# Patient Record
Sex: Female | Born: 1945 | Race: Black or African American | Hispanic: No | Smoking: Current some day smoker
Health system: Southern US, Community
[De-identification: ages and names within clinical notes are randomized; demographics above are authoritative.]

## PROBLEM LIST (undated history)

## (undated) DIAGNOSIS — G709 Myoneural disorder, unspecified: Secondary | ICD-10-CM

## (undated) DIAGNOSIS — H269 Unspecified cataract: Secondary | ICD-10-CM

## (undated) DIAGNOSIS — K759 Inflammatory liver disease, unspecified: Secondary | ICD-10-CM

## (undated) DIAGNOSIS — T7840XA Allergy, unspecified, initial encounter: Secondary | ICD-10-CM

## (undated) DIAGNOSIS — R112 Nausea with vomiting, unspecified: Secondary | ICD-10-CM

## (undated) DIAGNOSIS — Z923 Personal history of irradiation: Secondary | ICD-10-CM

## (undated) DIAGNOSIS — F419 Anxiety disorder, unspecified: Secondary | ICD-10-CM

## (undated) DIAGNOSIS — D649 Anemia, unspecified: Secondary | ICD-10-CM

## (undated) DIAGNOSIS — E785 Hyperlipidemia, unspecified: Secondary | ICD-10-CM

## (undated) DIAGNOSIS — M1990 Unspecified osteoarthritis, unspecified site: Secondary | ICD-10-CM

## (undated) DIAGNOSIS — T8859XA Other complications of anesthesia, initial encounter: Secondary | ICD-10-CM

## (undated) DIAGNOSIS — Z9889 Other specified postprocedural states: Secondary | ICD-10-CM

## (undated) DIAGNOSIS — R609 Edema, unspecified: Secondary | ICD-10-CM

## (undated) DIAGNOSIS — M79609 Pain in unspecified limb: Secondary | ICD-10-CM

## (undated) DIAGNOSIS — M545 Low back pain: Secondary | ICD-10-CM

## (undated) DIAGNOSIS — B1710 Acute hepatitis C without hepatic coma: Secondary | ICD-10-CM

## (undated) DIAGNOSIS — I10 Essential (primary) hypertension: Secondary | ICD-10-CM

## (undated) DIAGNOSIS — E119 Type 2 diabetes mellitus without complications: Secondary | ICD-10-CM

## (undated) DIAGNOSIS — M069 Rheumatoid arthritis, unspecified: Secondary | ICD-10-CM

## (undated) DIAGNOSIS — K219 Gastro-esophageal reflux disease without esophagitis: Secondary | ICD-10-CM

## (undated) HISTORY — PX: ROTATOR CUFF REPAIR: SHX139

## (undated) HISTORY — PX: CATARACT EXTRACTION: SUR2

## (undated) HISTORY — DX: Myoneural disorder, unspecified: G70.9

## (undated) HISTORY — DX: Unspecified osteoarthritis, unspecified site: M19.90

## (undated) HISTORY — PX: LUMBAR LAMINECTOMY: SHX95

## (undated) HISTORY — DX: Allergy, unspecified, initial encounter: T78.40XA

## (undated) HISTORY — DX: Unspecified cataract: H26.9

## (undated) HISTORY — DX: Hyperlipidemia, unspecified: E78.5

## (undated) HISTORY — DX: Anemia, unspecified: D64.9

## (undated) HISTORY — DX: Essential (primary) hypertension: I10

## (undated) HISTORY — DX: Low back pain: M54.5

## (undated) HISTORY — PX: OTHER SURGICAL HISTORY: SHX169

## (undated) HISTORY — DX: Rheumatoid arthritis, unspecified: M06.9

## (undated) HISTORY — DX: Pain in unspecified limb: M79.609

## (undated) HISTORY — DX: Type 2 diabetes mellitus without complications: E11.9

## (undated) HISTORY — DX: Edema, unspecified: R60.9

## (undated) HISTORY — DX: Gastro-esophageal reflux disease without esophagitis: K21.9

## (undated) HISTORY — DX: Acute hepatitis C without hepatic coma: B17.10

---

## 1997-11-13 ENCOUNTER — Other Ambulatory Visit: Admission: RE | Admit: 1997-11-13 | Discharge: 1997-11-13 | Payer: Self-pay | Admitting: Gynecology

## 1997-11-17 ENCOUNTER — Ambulatory Visit (HOSPITAL_COMMUNITY): Admission: RE | Admit: 1997-11-17 | Discharge: 1997-11-17 | Payer: Self-pay | Admitting: Internal Medicine

## 1998-06-11 ENCOUNTER — Emergency Department (HOSPITAL_COMMUNITY): Admission: EM | Admit: 1998-06-11 | Discharge: 1998-06-11 | Payer: Self-pay | Admitting: Emergency Medicine

## 1998-06-12 ENCOUNTER — Encounter: Payer: Self-pay | Admitting: Emergency Medicine

## 1998-06-26 ENCOUNTER — Encounter: Payer: Self-pay | Admitting: Internal Medicine

## 1998-06-26 ENCOUNTER — Ambulatory Visit (HOSPITAL_COMMUNITY): Admission: RE | Admit: 1998-06-26 | Discharge: 1998-06-26 | Payer: Self-pay | Admitting: Internal Medicine

## 1998-08-19 ENCOUNTER — Emergency Department (HOSPITAL_COMMUNITY): Admission: EM | Admit: 1998-08-19 | Discharge: 1998-08-19 | Payer: Self-pay | Admitting: Emergency Medicine

## 1998-09-20 ENCOUNTER — Ambulatory Visit (HOSPITAL_COMMUNITY): Admission: RE | Admit: 1998-09-20 | Discharge: 1998-09-20 | Payer: Self-pay | Admitting: Gastroenterology

## 2000-01-09 ENCOUNTER — Encounter: Payer: Self-pay | Admitting: Cardiology

## 2000-01-09 ENCOUNTER — Inpatient Hospital Stay (HOSPITAL_COMMUNITY): Admission: EM | Admit: 2000-01-09 | Discharge: 2000-01-10 | Payer: Self-pay | Admitting: Emergency Medicine

## 2001-02-17 ENCOUNTER — Ambulatory Visit (HOSPITAL_COMMUNITY): Admission: RE | Admit: 2001-02-17 | Discharge: 2001-02-17 | Payer: Self-pay | Admitting: Family Medicine

## 2001-02-17 ENCOUNTER — Encounter: Payer: Self-pay | Admitting: Family Medicine

## 2002-03-03 LAB — HM COLONOSCOPY

## 2002-08-03 ENCOUNTER — Ambulatory Visit (HOSPITAL_COMMUNITY): Admission: RE | Admit: 2002-08-03 | Discharge: 2002-08-03 | Payer: Self-pay | Admitting: Family Medicine

## 2002-08-03 ENCOUNTER — Encounter: Payer: Self-pay | Admitting: Family Medicine

## 2004-01-10 ENCOUNTER — Ambulatory Visit: Payer: Self-pay | Admitting: *Deleted

## 2005-06-21 ENCOUNTER — Emergency Department (HOSPITAL_COMMUNITY): Admission: EM | Admit: 2005-06-21 | Discharge: 2005-06-21 | Payer: Self-pay | Admitting: Emergency Medicine

## 2005-07-03 ENCOUNTER — Emergency Department (HOSPITAL_COMMUNITY): Admission: EM | Admit: 2005-07-03 | Discharge: 2005-07-03 | Payer: Self-pay | Admitting: Emergency Medicine

## 2005-07-04 ENCOUNTER — Encounter (HOSPITAL_BASED_OUTPATIENT_CLINIC_OR_DEPARTMENT_OTHER): Admission: RE | Admit: 2005-07-04 | Discharge: 2005-07-30 | Payer: Self-pay | Admitting: Surgery

## 2005-09-12 ENCOUNTER — Ambulatory Visit: Payer: Self-pay | Admitting: Family Medicine

## 2006-06-08 ENCOUNTER — Ambulatory Visit: Payer: Self-pay | Admitting: Family Medicine

## 2006-10-08 ENCOUNTER — Encounter: Payer: Self-pay | Admitting: Family Medicine

## 2006-10-29 ENCOUNTER — Emergency Department (HOSPITAL_COMMUNITY): Admission: EM | Admit: 2006-10-29 | Discharge: 2006-10-29 | Payer: Self-pay | Admitting: Emergency Medicine

## 2006-11-05 ENCOUNTER — Ambulatory Visit: Payer: Self-pay | Admitting: Family Medicine

## 2006-11-05 DIAGNOSIS — K0500 Acute gingivitis, plaque induced: Secondary | ICD-10-CM | POA: Insufficient documentation

## 2006-11-05 DIAGNOSIS — I10 Essential (primary) hypertension: Secondary | ICD-10-CM

## 2006-11-05 DIAGNOSIS — E1140 Type 2 diabetes mellitus with diabetic neuropathy, unspecified: Secondary | ICD-10-CM | POA: Insufficient documentation

## 2006-11-05 DIAGNOSIS — E119 Type 2 diabetes mellitus without complications: Secondary | ICD-10-CM

## 2006-11-05 DIAGNOSIS — B373 Candidiasis of vulva and vagina: Secondary | ICD-10-CM | POA: Insufficient documentation

## 2006-11-05 DIAGNOSIS — M069 Rheumatoid arthritis, unspecified: Secondary | ICD-10-CM

## 2006-11-05 HISTORY — DX: Rheumatoid arthritis, unspecified: M06.9

## 2006-11-05 HISTORY — DX: Essential (primary) hypertension: I10

## 2006-11-05 HISTORY — DX: Type 2 diabetes mellitus without complications: E11.9

## 2006-11-20 ENCOUNTER — Ambulatory Visit: Payer: Self-pay | Admitting: Family Medicine

## 2006-11-20 DIAGNOSIS — K219 Gastro-esophageal reflux disease without esophagitis: Secondary | ICD-10-CM

## 2006-11-20 HISTORY — DX: Gastro-esophageal reflux disease without esophagitis: K21.9

## 2006-12-22 ENCOUNTER — Telehealth: Payer: Self-pay | Admitting: *Deleted

## 2006-12-25 ENCOUNTER — Ambulatory Visit: Payer: Self-pay | Admitting: Family Medicine

## 2006-12-25 LAB — CONVERTED CEMR LAB
Bilirubin Urine: NEGATIVE
Ketones, urine, test strip: NEGATIVE
Nitrite: NEGATIVE
Specific Gravity, Urine: >=1.03
Urobilinogen, UA: 1
WBC Urine, dipstick: NEGATIVE
pH: 5

## 2006-12-28 ENCOUNTER — Ambulatory Visit (HOSPITAL_BASED_OUTPATIENT_CLINIC_OR_DEPARTMENT_OTHER): Admission: RE | Admit: 2006-12-28 | Discharge: 2006-12-28 | Payer: Self-pay | Admitting: Orthopedic Surgery

## 2006-12-28 ENCOUNTER — Telehealth (INDEPENDENT_AMBULATORY_CARE_PROVIDER_SITE_OTHER): Payer: Self-pay | Admitting: *Deleted

## 2006-12-28 LAB — CONVERTED CEMR LAB
ALT: 61 units/L — ABNORMAL HIGH (ref 0–35)
AST: 44 units/L — ABNORMAL HIGH (ref 0–37)
Albumin: 3.3 g/dL — ABNORMAL LOW (ref 3.5–5.2)
Alkaline Phosphatase: 60 units/L (ref 39–117)
BUN: 19 mg/dL (ref 6–23)
Basophils Absolute: 0 10*3/uL (ref 0.0–0.1)
Basophils Relative: 0.6 % (ref 0.0–1.0)
Bilirubin, Direct: 0.2 mg/dL (ref 0.0–0.3)
CO2: 28 meq/L (ref 19–32)
Calcium: 9.6 mg/dL (ref 8.4–10.5)
Chloride: 102 meq/L (ref 96–112)
Cholesterol: 214 mg/dL (ref 0–200)
Creatinine, Ser: 0.9 mg/dL (ref 0.4–1.2)
Creatinine,U: 245 mg/dL
Direct LDL: 143 mg/dL
Eosinophils Absolute: 0 10*3/uL (ref 0.0–0.6)
Eosinophils Relative: 0.7 % (ref 0.0–5.0)
GFR calc Af Amer: 82 mL/min
GFR calc non Af Amer: 68 mL/min
Glucose, Bld: 225 mg/dL — ABNORMAL HIGH (ref 70–99)
HCT: 37.2 % (ref 36.0–46.0)
HDL: 46.2 mg/dL (ref >39.0)
Hemoglobin: 12.7 g/dL (ref 12.0–15.0)
Hgb A1c MFr Bld: 10.4 % — ABNORMAL HIGH (ref 4.6–6.0)
LDL Cholesterol: 146 mg/dL — ABNORMAL HIGH (ref 0–99)
Lymphocytes Relative: 34.4 % (ref 12.0–46.0)
MCHC: 34.2 g/dL (ref 30.0–36.0)
MCV: 82.5 fL (ref 78.0–100.0)
Microalb Creat Ratio: 7.3 mg/g (ref 0.0–30.0)
Microalb, Ur: 1.8 mg/dL (ref 0.0–1.9)
Monocytes Absolute: 0.8 10*3/uL — ABNORMAL HIGH (ref 0.2–0.7)
Monocytes Relative: 12.1 % — ABNORMAL HIGH (ref 3.0–11.0)
Neutro Abs: 3.4 10*3/uL (ref 1.4–7.7)
Neutrophils Relative %: 52.2 % (ref 43.0–77.0)
Platelets: 177 10*3/uL (ref 150–400)
Potassium: 4.7 meq/L (ref 3.5–5.1)
RBC: 4.51 M/uL (ref 3.87–5.11)
RDW: 14.8 % — ABNORMAL HIGH (ref 11.5–14.6)
Sodium: 140 meq/L (ref 135–145)
TSH: 1.45 microintl units/mL (ref 0.35–5.50)
Total Bilirubin: 0.9 mg/dL (ref 0.3–1.2)
Total CHOL/HDL Ratio: 4.6
Total Protein: 7.2 g/dL (ref 6.0–8.3)
Triglycerides: 107 mg/dL (ref 0–149)
VLDL: 21 mg/dL (ref 0–40)
WBC: 6.4 10*3/uL (ref 4.5–10.5)

## 2007-02-08 ENCOUNTER — Other Ambulatory Visit: Admission: RE | Admit: 2007-02-08 | Discharge: 2007-02-08 | Payer: Self-pay | Admitting: Family Medicine

## 2007-02-08 ENCOUNTER — Encounter: Payer: Self-pay | Admitting: Family Medicine

## 2007-02-08 ENCOUNTER — Ambulatory Visit: Payer: Self-pay | Admitting: Family Medicine

## 2007-02-08 DIAGNOSIS — B1710 Acute hepatitis C without hepatic coma: Secondary | ICD-10-CM

## 2007-02-08 HISTORY — DX: Acute hepatitis C without hepatic coma: B17.10

## 2007-02-11 ENCOUNTER — Encounter: Payer: Self-pay | Admitting: Family Medicine

## 2007-02-23 ENCOUNTER — Encounter: Payer: Self-pay | Admitting: Family Medicine

## 2007-03-03 ENCOUNTER — Encounter: Payer: Self-pay | Admitting: Family Medicine

## 2007-04-14 ENCOUNTER — Telehealth: Payer: Self-pay | Admitting: Family Medicine

## 2007-04-22 ENCOUNTER — Encounter: Payer: Self-pay | Admitting: Family Medicine

## 2007-04-26 ENCOUNTER — Encounter: Payer: Self-pay | Admitting: Family Medicine

## 2007-05-04 ENCOUNTER — Encounter: Payer: Self-pay | Admitting: Family Medicine

## 2007-07-16 ENCOUNTER — Encounter: Payer: Self-pay | Admitting: Family Medicine

## 2007-07-19 ENCOUNTER — Telehealth: Payer: Self-pay | Admitting: Family Medicine

## 2007-07-21 ENCOUNTER — Ambulatory Visit: Payer: Self-pay | Admitting: Family Medicine

## 2007-07-21 DIAGNOSIS — R609 Edema, unspecified: Secondary | ICD-10-CM

## 2007-07-21 DIAGNOSIS — M545 Low back pain, unspecified: Secondary | ICD-10-CM | POA: Insufficient documentation

## 2007-07-21 HISTORY — DX: Low back pain, unspecified: M54.50

## 2007-07-21 HISTORY — DX: Edema, unspecified: R60.9

## 2007-07-27 ENCOUNTER — Emergency Department (HOSPITAL_COMMUNITY): Admission: EM | Admit: 2007-07-27 | Discharge: 2007-07-27 | Payer: Self-pay | Admitting: Emergency Medicine

## 2007-09-02 ENCOUNTER — Telehealth: Payer: Self-pay | Admitting: Family Medicine

## 2007-09-15 ENCOUNTER — Encounter: Payer: Self-pay | Admitting: Family Medicine

## 2007-09-21 ENCOUNTER — Encounter: Payer: Self-pay | Admitting: Family Medicine

## 2007-09-21 ENCOUNTER — Encounter: Admission: RE | Admit: 2007-09-21 | Discharge: 2007-10-26 | Payer: Self-pay | Admitting: Family Medicine

## 2007-10-08 ENCOUNTER — Encounter: Payer: Self-pay | Admitting: Family Medicine

## 2007-10-18 ENCOUNTER — Encounter: Payer: Self-pay | Admitting: Family Medicine

## 2007-10-26 ENCOUNTER — Encounter: Payer: Self-pay | Admitting: Family Medicine

## 2007-11-12 ENCOUNTER — Encounter: Payer: Self-pay | Admitting: Family Medicine

## 2007-11-19 ENCOUNTER — Encounter: Payer: Self-pay | Admitting: Family Medicine

## 2007-11-27 ENCOUNTER — Encounter: Payer: Self-pay | Admitting: Family Medicine

## 2007-12-14 ENCOUNTER — Encounter: Payer: Self-pay | Admitting: Family Medicine

## 2007-12-16 ENCOUNTER — Emergency Department (HOSPITAL_COMMUNITY): Admission: EM | Admit: 2007-12-16 | Discharge: 2007-12-17 | Payer: Self-pay | Admitting: Emergency Medicine

## 2008-02-28 ENCOUNTER — Encounter: Payer: Self-pay | Admitting: Family Medicine

## 2008-03-03 HISTORY — PX: UPPER GASTROINTESTINAL ENDOSCOPY: SHX188

## 2008-04-03 ENCOUNTER — Encounter: Payer: Self-pay | Admitting: Family Medicine

## 2008-06-20 ENCOUNTER — Encounter: Payer: Self-pay | Admitting: Family Medicine

## 2008-06-20 HISTORY — PX: ESOPHAGOGASTRODUODENOSCOPY: SHX1529

## 2008-06-20 HISTORY — PX: COLONOSCOPY: SHX174

## 2008-10-04 ENCOUNTER — Encounter: Payer: Self-pay | Admitting: Family Medicine

## 2008-10-10 ENCOUNTER — Encounter: Payer: Self-pay | Admitting: Family Medicine

## 2008-12-01 ENCOUNTER — Ambulatory Visit: Payer: Self-pay | Admitting: Family Medicine

## 2008-12-27 ENCOUNTER — Encounter: Payer: Self-pay | Admitting: Family Medicine

## 2009-02-12 ENCOUNTER — Ambulatory Visit: Payer: Self-pay | Admitting: Family Medicine

## 2009-02-12 DIAGNOSIS — J019 Acute sinusitis, unspecified: Secondary | ICD-10-CM | POA: Insufficient documentation

## 2009-03-09 ENCOUNTER — Telehealth: Payer: Self-pay | Admitting: Family Medicine

## 2009-03-19 ENCOUNTER — Encounter: Payer: Self-pay | Admitting: Family Medicine

## 2009-03-30 ENCOUNTER — Ambulatory Visit: Payer: Self-pay | Admitting: Family Medicine

## 2009-04-03 LAB — CONVERTED CEMR LAB
ALT: 24 units/L (ref 0–35)
AST: 28 units/L (ref 0–37)
Albumin: 3.4 g/dL — ABNORMAL LOW (ref 3.5–5.2)
Alkaline Phosphatase: 71 units/L (ref 39–117)
BUN: 13 mg/dL (ref 6–23)
Basophils Absolute: 0 10*3/uL (ref 0.0–0.1)
Basophils Relative: 0.6 % (ref 0.0–3.0)
Bilirubin, Direct: 0.2 mg/dL (ref 0.0–0.3)
CO2: 33 meq/L — ABNORMAL HIGH (ref 19–32)
Calcium: 9.2 mg/dL (ref 8.4–10.5)
Chloride: 107 meq/L (ref 96–112)
Creatinine, Ser: 0.8 mg/dL (ref 0.4–1.2)
Creatinine,U: 170.8 mg/dL
Eosinophils Absolute: 0 10*3/uL (ref 0.0–0.7)
Eosinophils Relative: 0.9 % (ref 0.0–5.0)
GFR calc non Af Amer: 93.4 mL/min (ref >60)
Glucose, Bld: 184 mg/dL — ABNORMAL HIGH (ref 70–99)
HCT: 40.6 % (ref 36.0–46.0)
Hemoglobin: 12.8 g/dL (ref 12.0–15.0)
Hgb A1c MFr Bld: 6.5 % (ref 4.6–6.5)
Lymphocytes Relative: 38.7 % (ref 12.0–46.0)
Lymphs Abs: 1.5 10*3/uL (ref 0.7–4.0)
MCHC: 31.6 g/dL (ref 30.0–36.0)
MCV: 83.4 fL (ref 78.0–100.0)
Microalb Creat Ratio: 7 mg/g (ref 0.0–30.0)
Microalb, Ur: 1.2 mg/dL (ref 0.0–1.9)
Monocytes Absolute: 0.4 10*3/uL (ref 0.1–1.0)
Monocytes Relative: 11.7 % (ref 3.0–12.0)
Neutro Abs: 1.9 10*3/uL (ref 1.4–7.7)
Neutrophils Relative %: 48.1 % (ref 43.0–77.0)
Platelets: 154 10*3/uL (ref 150.0–400.0)
Potassium: 4.9 meq/L (ref 3.5–5.1)
RBC: 4.87 M/uL (ref 3.87–5.11)
RDW: 15.8 % — ABNORMAL HIGH (ref 11.5–14.6)
Sodium: 144 meq/L (ref 135–145)
TSH: 0.89 microintl units/mL (ref 0.35–5.50)
Total Bilirubin: 1 mg/dL (ref 0.3–1.2)
Total Protein: 7.5 g/dL (ref 6.0–8.3)
WBC: 3.8 10*3/uL — ABNORMAL LOW (ref 4.5–10.5)

## 2009-05-23 ENCOUNTER — Ambulatory Visit: Payer: Self-pay | Admitting: Family Medicine

## 2009-05-23 DIAGNOSIS — M79609 Pain in unspecified limb: Secondary | ICD-10-CM | POA: Insufficient documentation

## 2009-05-23 HISTORY — DX: Pain in unspecified limb: M79.609

## 2009-05-31 ENCOUNTER — Encounter: Payer: Self-pay | Admitting: Family Medicine

## 2009-06-04 ENCOUNTER — Telehealth: Payer: Self-pay | Admitting: Family Medicine

## 2009-07-03 ENCOUNTER — Encounter: Payer: Self-pay | Admitting: Family Medicine

## 2009-07-25 ENCOUNTER — Telehealth: Payer: Self-pay | Admitting: Family Medicine

## 2009-08-16 ENCOUNTER — Encounter: Payer: Self-pay | Admitting: Family Medicine

## 2009-09-19 ENCOUNTER — Encounter: Payer: Self-pay | Admitting: Family Medicine

## 2009-11-01 ENCOUNTER — Emergency Department (HOSPITAL_COMMUNITY): Admission: EM | Admit: 2009-11-01 | Discharge: 2009-11-02 | Payer: Self-pay | Admitting: Emergency Medicine

## 2009-11-06 ENCOUNTER — Telehealth: Payer: Self-pay | Admitting: Family Medicine

## 2009-11-07 ENCOUNTER — Ambulatory Visit: Payer: Self-pay | Admitting: Family Medicine

## 2009-11-09 ENCOUNTER — Telehealth: Payer: Self-pay | Admitting: Internal Medicine

## 2009-11-21 ENCOUNTER — Encounter: Payer: Self-pay | Admitting: Family Medicine

## 2009-12-03 ENCOUNTER — Ambulatory Visit: Payer: Self-pay | Admitting: Family Medicine

## 2009-12-06 LAB — CONVERTED CEMR LAB
ALT: 31 units/L (ref 0–35)
AST: 28 units/L (ref 0–37)
Albumin: 3.7 g/dL (ref 3.5–5.2)
Alkaline Phosphatase: 64 units/L (ref 39–117)
BUN: 22 mg/dL (ref 6–23)
Basophils Absolute: 0 10*3/uL (ref 0.0–0.1)
Basophils Relative: 0.4 % (ref 0.0–3.0)
Bilirubin, Direct: 0.2 mg/dL (ref 0.0–0.3)
CO2: 29 meq/L (ref 19–32)
Calcium: 9 mg/dL (ref 8.4–10.5)
Chloride: 98 meq/L (ref 96–112)
Creatinine, Ser: 1 mg/dL (ref 0.4–1.2)
Eosinophils Absolute: 0.1 10*3/uL (ref 0.0–0.7)
Eosinophils Relative: 1.1 % (ref 0.0–5.0)
GFR calc non Af Amer: 75.51 mL/min (ref >60)
Glucose, Bld: 118 mg/dL — ABNORMAL HIGH (ref 70–99)
HCT: 39.5 % (ref 36.0–46.0)
Hemoglobin: 13.1 g/dL (ref 12.0–15.0)
Hgb A1c MFr Bld: 11.2 % — ABNORMAL HIGH (ref 4.6–6.5)
Lymphocytes Relative: 31.2 % (ref 12.0–46.0)
Lymphs Abs: 1.9 10*3/uL (ref 0.7–4.0)
MCHC: 33 g/dL (ref 30.0–36.0)
MCV: 82.6 fL (ref 78.0–100.0)
Monocytes Absolute: 0.7 10*3/uL (ref 0.1–1.0)
Monocytes Relative: 11.6 % (ref 3.0–12.0)
Neutro Abs: 3.4 10*3/uL (ref 1.4–7.7)
Neutrophils Relative %: 55.7 % (ref 43.0–77.0)
Platelets: 170 10*3/uL (ref 150.0–400.0)
Potassium: 4.2 meq/L (ref 3.5–5.1)
RBC: 4.78 M/uL (ref 3.87–5.11)
RDW: 15.1 % — ABNORMAL HIGH (ref 11.5–14.6)
Sodium: 136 meq/L (ref 135–145)
TSH: 0.73 microintl units/mL (ref 0.35–5.50)
Total Bilirubin: 0.9 mg/dL (ref 0.3–1.2)
Total Protein: 7.9 g/dL (ref 6.0–8.3)
WBC: 6.2 10*3/uL (ref 4.5–10.5)

## 2009-12-18 ENCOUNTER — Encounter: Payer: Self-pay | Admitting: Family Medicine

## 2010-01-21 ENCOUNTER — Telehealth: Payer: Self-pay | Admitting: Family Medicine

## 2010-01-28 ENCOUNTER — Ambulatory Visit: Payer: Self-pay | Admitting: Family Medicine

## 2010-03-14 ENCOUNTER — Encounter: Payer: Self-pay | Admitting: Family Medicine

## 2010-03-20 ENCOUNTER — Encounter: Payer: Self-pay | Admitting: Family Medicine

## 2010-04-02 NOTE — Letter (Signed)
Summary: [facility]  [facility]   Imported By: Maryln Gottron 12/24/2007 13:36:15  _____________________________________________________________________  External Attachment:    Type:   Image     Comment:   External Document

## 2010-04-02 NOTE — Assessment & Plan Note (Signed)
Summary: FLU SHOT//LH  Nurse Visit   Allergies: 1)  ! * Tramadol 2)  ! Aspirin  Orders Added: 1)  Flu Vaccine 65yrs + [90658] 2)  Administration Flu vaccine - MCR [G0008] Flu Vaccine Consent Questions     Do you have a history of severe allergic reactions to this vaccine? no    Any prior history of allergic reactions to egg and/or gelatin? no    Do you have a sensitivity to the preservative Thimersol? no    Do you have a past history of Guillan-Barre Syndrome? no    Do you currently have an acute febrile illness? no    Have you ever had a severe reaction to latex? no    Vaccine information given and explained to patient? yes    Are you currently pregnant? no    Lot Number:[id number]   Exp Date:08/30/2009   Site Given  Left Deltoid IMmedflu

## 2010-04-02 NOTE — Letter (Signed)
Summary: [facility]   Imported By: Maryln Gottron 06/07/2009 15:42:55  _____________________________________________________________________  External Attachment:    Type:   Image     Comment:   External Document

## 2010-04-02 NOTE — Progress Notes (Signed)
Summary: denial of Januvia.  Phone Note Call from Patient   Caller: Patient Call For: Dr. [name] Summary of Call: Pt. concerned about Januvia refills.  Mailed her the denial from [facility] and she will discuss this with her pharmacy. Initial call taken by: Lynann Beaver CMA,  April 14, 2007 11:10 AM

## 2010-04-02 NOTE — Progress Notes (Signed)
Summary: Call-A-Nurse Report    Call-A-Nurse Triage Call Report Triage Record Num: [id number] Operator: April Finney Patient Name: Wendy [name] Call Date & Time: 11/01/2009 10:33:40PM Patient Phone: [phone] PCP: Tera Mater. [name] Patient Gender: Female PCP Fax : [phone] Patient DOB: 1945-03-09 Practice Name: [facility] Reason for Call: Bonita Quin calling about blood sugar over 600 and took 2 Glipizide pills and blood sugar now 501. Dry mouth, extreme thirst, frequent urination, mild dizziness. She is not on any insuling. See in ED care advice given. Protocol(s) Used: Diabetes: Control Problems Recommended Outcome per Protocol: See ED Immediately Reason for Outcome: Signs and symptoms of ketoacidosis AND blood sugar more than 300 mg/dl Care Advice:  ~ Another adult should drive. Dehydration can affect blood sugar levels. Drink water during transport and while waiting to see a provider. If vomiting, take sips of water or suck on ice chips.  ~  ~ When sitting, use a chair with arms to help protect from falling.  ~ IMMEDIATE ACTION Write down provider's name. List or place the following in a bag for transport with the patient: current prescription and/or nonprescription medications; alternative treatments, therapies and medications; and street drugs.  ~  ~ Call EMS 911 if any loss of consciousness, difficult to awaken, slow to respond, or new onset of confused thinking. 11/01/2009 10:39:37PM Page 1 of 1 [email or web address]

## 2010-04-02 NOTE — Consult Note (Signed)
[facility]   Imported By: Kassie Mends 05/11/2007 08:26:35  _____________________________________________________________________  External Attachment:    Type:   Image     Comment:   [facility]

## 2010-04-02 NOTE — Letter (Signed)
Summary: Discharge Order & Summary -[facility]  Discharge Order & Summary -[facility]   Imported By: Maryln Gottron 12/08/2007 13:53:02  _____________________________________________________________________  External Attachment:    Type:   Image     Comment:   External Document

## 2010-04-02 NOTE — Consult Note (Signed)
[facility]   Imported By: Kassie Mends 09/30/2007 08:11:04  _____________________________________________________________________  External Attachment:    Type:   Image     Comment:   [facility]

## 2010-04-02 NOTE — Progress Notes (Signed)
Summary: FAX LABS FOR PT'S SURGERY ON MONDAY  Phone Note From Other Clinic Call back at [phone]    Caller: LORRAINE PRE OP [facility] Call For: FRY Summary of Call: SHE IS HAVING LAB WORK DONE AND CAN WE DO A B MET AND HAVE RUN AND READY FOR HER ON FRIDAY AFTERNOON  FAX [phone] ALSO SHE NEEDS HER LAST OFFICE NOTE  Initial call taken by: Roselle Locus,  December 22, 2006 1:17 PM  Follow-up for Phone Call        could you find out exactly what they want? Are they doing a BMET or [place]? what procedure are they going to do? Follow-up by: [name] MD,  December 22, 2006 3:31 PM  Additional Follow-up for Phone Call Additional follow up Details #1::        rt shoulder surgery monday morning and she is coming in for labwork on friday.  They wanted to make sure that BMET was being done and I told them it was.  They need labs faxed to them as soon as we get results on friday.  also needs office note faxed. Additional Follow-up by: Alfred Levins, CMA,  December 23, 2006 11:06 AM    Additional Follow-up for Phone Call Additional follow up Details #2::    ok to send office note from 11-20-06. Will draw labs on 12-25-06. Follow-up by: [name] MD,  December 23, 2006 11:42 AM  Additional Follow-up for Phone Call Additional follow up Details #3:: Details for Additional Follow-up Action Taken: Office notes 10/21 and Urine lab was the only one done by 6 pm, these were. Milisa called [facility] and they will fax other labs to [phone] when finished.  Confirmation ot fax received. ..................................................................Marland KitchenSid Falcon LPN  December 25, 2006 5:59 PM

## 2010-04-02 NOTE — Progress Notes (Signed)
Summary: LMTCB 5/18, NEUROLOGIST, DR [name] TO REFER TO A SPINAL THERAPIST  Phone Note Call from Patient Call back at [phone]   Caller: PT [facility] Call For: FRY  Summary of Call: NEUROLOGIST WANTS DR FRY TO REFER HER TO A SPINAL THERAPIST.   Initial call taken by: Roselle Locus,  Jul 19, 2007 9:18 AM  Follow-up for Phone Call        make an ov with me to discuss Follow-up by: [name] MD,  Jul 19, 2007 1:13 PM  Additional Follow-up for Phone Call Additional follow up Details #1::        LMTCB to schedule OV with Dr [name]. Sid Falcon LPN  Jul 19, 2007 1:26 PM  Has scheduled appt 5-20.  Additional Follow-up by: Rudy Jew, RN,  Jul 20, 2007 10:27 AM

## 2010-04-02 NOTE — Consult Note (Signed)
[facility]   Imported By: Kassie Mends 08/12/2007 08:50:53  _____________________________________________________________________  External Attachment:    Type:   Image     Comment:   [facility]

## 2010-04-02 NOTE — Assessment & Plan Note (Signed)
Summary: discuss therapist/mhjf   Vital Signs:  Patient Profile:   65 Years Old Female Height:     63.5 inches Weight:      187 pounds Temp:     98.4 degrees F oral Pulse rate:   100 / minute Pulse rhythm:   regular BP sitting:   134 / 90  (left arm) Cuff size:   large  Vitals Entered By: Alfred Levins, CMA (Jul 21, 2007 2:23 PM)                 Chief Complaint:  ref to spinal therapist and discuss meds.  History of Present Illness: Has been seeing a neurosurgeon by the name of Dr. Chrisandra Netters at [facility] for her low back pain. 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 She reccomended trying PT first, and the pt. is here for my advice. She also wants a med to take as needed for fluid in her feet.     Current Allergies: ! * TRAMADOL ! ASPIRIN  Past Medical History:    Reviewed history from 02/08/2007 and no changes required:       Diabetes mellitus, type II       Hypertension       Rheumatoid arthritis, sees Dr. Lovena Neighbours and Dr. 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        Hepatitis C, sees GI at [facility]       GERD  Past Surgical History:    Reviewed history from 02/08/2007 and no changes required:       colonoscopy 2004, hemmorrhoids only     Review of Systems      See HPI   Physical Exam  General:     Well-developed,well-nourished,in no acute distress; alert,appropriate and cooperative throughout examination    Impression & Recommendations:  Problem # 1:  LOW BACK PAIN SYNDROME (ICD-724.2)  Orders: Physical Therapy Referral (PT)   Problem # 2:  DEPENDENT EDEMA (ICD-782.3)  Her updated medication list for this problem includes:    Lisinopril-hydrochlorothiazide 20-25 Mg Tabs (Lisinopril-hydrochlorothiazide) ..... Once daily    Furosemide 20 Mg Tabs (Furosemide) ..... Once daily as needed fluid   Complete Medication List: 1)  Prilosec 20 Mg Cpdr (Omeprazole) .[name].. 1 by mouth once daily 2)   Plaquenil 200 Mg Tabs (Hydroxychloroquine sulfate) .[name].. 1 by mouth two times a day 3)  Diflucan 150 Mg Tabs (Fluconazole) .... As directed 4)  Lisinopril-hydrochlorothiazide 20-25 Mg Tabs (Lisinopril-hydrochlorothiazide) .... Once daily 5)  Avandia 2 Mg Tabs (Rosiglitazone maleate) .... Two times a day 6)  Januvia 100 Mg Tabs (Sitagliptin phosphate) .... Once daily 7)  Nexium 40 Mg Cpdr (Esomeprazole magnesium) .... Once daily 8)  Furosemide 20 Mg Tabs (Furosemide) .... Once daily as needed fluid   Patient Instructions: 1)  will refer to PT   Prescriptions: FUROSEMIDE 20 MG  TABS (FUROSEMIDE) once daily as needed fluid  #30 x 11   Entered and Authorized by:   [name] MD   Signed by:   [name] MD on 07/21/2007   Method used:   Print then Give to Patient   RxID:   [id number]  ]

## 2010-04-02 NOTE — Procedures (Signed)
[facility]   Imported By: Kassie Mends 04/28/2007 09:26:34  _____________________________________________________________________  External Attachment:    Type:   Image     Comment:   [facility]

## 2010-04-02 NOTE — Progress Notes (Signed)
  Phone Note From Other Clinic   Caller: Tammy in [facility] Summary of Call: needs copy of labs done last week.  I faxed to Tammy at [phone] Initial call taken by: Felipa Evener,  December 28, 2006 12:31 PM

## 2010-04-02 NOTE — Assessment & Plan Note (Signed)
Summary: FUP ON BLOOD SUGARS/CCM [facility] APPT TIME/[facility]   Vital Signs:  Patient Profile:   65 Years Old Female Weight:      163 pounds (74.09 kg) Temp:     98.3 degrees F (36.83 degrees C) oral Pulse rate:   104 / minute BP sitting:   112 / 66  (right arm)  Vitals Entered By: Alfred Levins, CMA (November 05, 2006 10:38 AM)                 Chief Complaint:  dizzy and weak and went to er and found about bs 1013.  History of Present Illness: Seen in ER on 10-29-06 for weakness, random glucose was 1013. Started on Glucophage XL once daily  and told to follow up with me. She had also been taking HCTZ and Norvasc, both of these were stopped due to low BP. Feels ok today.  Current Allergies: ! * TRAMADOL ! ASPIRIN  Past Medical History:    Reviewed history and no changes required:       Diabetes mellitus, type II       Hypertension       Rheumatoid arthritis  Past Surgical History:    Denies surgical history     Review of Systems  The patient denies anorexia, fever, weight loss, vision loss, decreased hearing, hoarseness, chest pain, syncope, dyspnea on exhertion, peripheral edema, prolonged cough, hemoptysis, abdominal pain, melena, hematochezia, severe indigestion/heartburn, hematuria, incontinence, genital sores, muscle weakness, suspicious skin lesions, transient blindness, difficulty walking, depression, unusual weight change, abnormal bleeding, enlarged lymph nodes, angioedema, breast masses, and testicular masses.     Physical Exam  General:     Well-developed,well-nourished,in no acute distress; alert,appropriate and cooperative throughout examination    Impression & Recommendations:  Problem # 1:  DIABETES MELLITUS, TYPE II (ICD-250.00)  The following medications were removed from the medication list:    Glucophage Xr 500 Mg Tb24 (Metformin hcl) .[name]... 1 by mouth two times a day  Her updated medication list for this problem includes:    Avandamet 2-500 Mg Tabs  (Rosiglitazone-metformin) .[name]..[name] Two times a day    Lisinopril 20 Mg Tabs (Lisinopril) ..... Once daily  Orders: Glucose, (CBG) (16109)   Problem # 2:  HYPERTENSION (ICD-401.9)  Her updated medication list for this problem includes:    Lisinopril 20 Mg Tabs (Lisinopril) ..... Once daily   Problem # 3:  GINGIVITIS, ACUTE, PLAQUE INDUCED (ICD-523.00)  Problem # 4:  CANDIDIASIS, VULVA/VAGINA (ICD-112.1)  Her updated medication list for this problem includes:    Diflucan 150 Mg Tabs (Fluconazole) .[name]... As directed   Complete Medication List: 1)  Prilosec 20 Mg Cpdr (Omeprazole) .[name].. 1 by mouth once daily 2)  Plaquenil 200 Mg Tabs (Hydroxychloroquine sulfate) .[name].. 1 by mouth once daily 3)  Avandamet 2-500 Mg Tabs (Rosiglitazone-metformin) .... Two times a day 4)  Lisinopril 20 Mg Tabs (Lisinopril) .... Once daily 5)  Doxycycline Hyclate 100 Mg Cpep (Doxycycline hyclate) .... Two times a day 6)  Diflucan 150 Mg Tabs (Fluconazole) .... As directed   Patient Instructions: 1)  Please schedule a follow-up appointment in 2 weeks.    Prescriptions: DIFLUCAN 150 MG  TABS (FLUCONAZOLE) as directed  #1 x 5   Entered and Authorized by:   [name] MD   Signed by:   [name] MD on 11/05/2006   Method used:   Print then Give to Patient   RxID:   [id number] DOXYCYCLINE HYCLATE 100 MG  CPEP (DOXYCYCLINE HYCLATE) two times a day  #20 x 0   Entered and Authorized by:   [name] MD   Signed by:   [name] MD on 11/05/2006   Method used:   Print then Give to Patient   RxID:   [id number] LISINOPRIL 20 MG  TABS (LISINOPRIL) once daily  #30 x 5   Entered and Authorized by:   [name] MD   Signed by:   [name] MD on 11/05/2006   Method used:   Print then Give to Patient   RxID:   [id number] AVANDAMET 2-500 MG  TABS (ROSIGLITAZONE-METFORMIN) two times a day  #60 x 5   Entered and Authorized by:   [name] MD   Signed by:   [name] MD  on 11/05/2006   Method used:   Print then Give to Patient   RxID:   [id number]

## 2010-04-02 NOTE — Letter (Signed)
Summary: [facility]  [facility]   Imported By: Maryln Gottron 10/26/2008 11:25:01  _____________________________________________________________________  External Attachment:    Type:   Image     Comment:   External Document

## 2010-04-02 NOTE — Assessment & Plan Note (Signed)
Summary: problems with left heel x 1 week//ccm   Vital Signs:  Patient profile:   65 year old female Weight:      196 pounds BMI:     34.30 Temp:     98.0 degrees F oral BP sitting:   90 / 72  (left arm) Cuff size:   regular  Vitals Entered By: Raechel Ache, RN (May 23, 2009 2:33 PM) CC: C/o worsening L foot pain, seems to start in heel. Is Patient Diabetic? Yes   History of Present Illness: here for one week of severe pains in the left foot around the ankles and the top of the back of the heel. No swelling or warmth.   Allergies: 1)  ! * Tramadol 2)  ! Aspirin  Past History:  Past Medical History: Reviewed history from 02/08/2007 and no changes required. Diabetes mellitus, type II Hypertension Rheumatoid arthritis, sees Dr. Lovena Neighbours and Dr. 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 colonoscopy 2004, hemmorrhoids only  Review of Systems  The patient denies anorexia, fever, weight loss, weight gain, vision loss, decreased hearing, hoarseness, chest pain, syncope, dyspnea on exertion, peripheral edema, prolonged cough, headaches, hemoptysis, abdominal pain, melena, hematochezia, severe indigestion/heartburn, hematuria, incontinence, genital sores, muscle weakness, suspicious skin lesions, transient blindness, difficulty walking, depression, unusual weight change, abnormal bleeding, enlarged lymph nodes, angioedema, breast masses, and testicular masses.    Physical Exam  General:  Well-developed,well-nourished,in no acute distress; alert,appropriate and cooperative throughout examination Msk:  the left foot is very tender around both malleoli, and the back of the heel. No swelling or warmth or redness   Impression & Recommendations:  Problem # 1:  FOOT PAIN (ICD-729.5)  Orders: Orthopedic Referral (Ortho)  Problem # 2:  RHEUMATOID  ARTHRITIS (ICD-714.0)  Complete Medication List: 1)  Plaquenil 200 Mg Tabs (Hydroxychloroquine sulfate) .[name].. 1 by mouth two times a day 2)  Lisinopril-hydrochlorothiazide 20-25 Mg Tabs (Lisinopril-hydrochlorothiazide) .... Once daily 3)  Furosemide 20 Mg Tabs (Furosemide) .... Once daily as needed fluid 4)  Glucotrol 10 Mg Tabs (Glipizide) .... Two times a day 5)  Omeprazole 40 Mg Cpdr (Omeprazole) .... Once daily 6)  Vicodin 5-500 Mg Tabs (Hydrocodone-acetaminophen) .[name].. 1 q 6 as needed for pain  Patient Instructions: 1)  this seems to be an arthritic pain, possibly rheumatoid. it does not look like gout. Will refer to Orthopedics

## 2010-04-02 NOTE — Consult Note (Signed)
Summary: [facility]  [name]   Imported By: Maryln Gottron 10/22/2007 13:20:56  _____________________________________________________________________  External Attachment:    Type:   Image     Comment:   External Document

## 2010-04-02 NOTE — Letter (Signed)
Summary: [facility]  [facility]   Imported By: Maryln Gottron 07/18/2009 13:30:18  _____________________________________________________________________  External Attachment:    Type:   Image     Comment:   External Document

## 2010-04-02 NOTE — Medication Information (Signed)
Summary: authorization  authorization   Imported By: Kassie Mends 04/12/2007 10:30:03  _____________________________________________________________________  External Attachment:    Type:   Image     Comment:   authorization

## 2010-04-02 NOTE — Assessment & Plan Note (Signed)
Summary: ha/njr   Vital Signs:  Patient profile:   65 year old female Weight:      193 pounds BMI:     33.77 Temp:     97.9 degrees F oral Pulse rate:   76 / minute BP sitting:   124 / 90  (right arm) Cuff size:   large  Vitals Entered By: Alfred Levins, CMA (February 12, 2009 2:36 PM) CC: h/a, nose bleed   History of Present Illness: Here for 2 weeks of HAs around the face and forehead, PND, ST, dry cough, and occasional nosebleeds. No fever. Her glucoses at home have been mostly stable from 120 to 200 range randomly. She is still taking Avandia.   Current Medications (verified): 1)  Plaquenil 200 Mg  Tabs (Hydroxychloroquine Sulfate) .[name].. 1 By Mouth Two Times A Day 2)  Lisinopril-Hydrochlorothiazide 20-25 Mg  Tabs (Lisinopril-Hydrochlorothiazide) .... Once Daily 3)  Avandia 2 Mg  Tabs (Rosiglitazone Maleate) .... Two Times A Day 4)  Nexium 40 Mg  Cpdr (Esomeprazole Magnesium) .... Once Daily 5)  Furosemide 20 Mg  Tabs (Furosemide) .... Once Daily As Needed Fluid  Allergies (verified): 1)  ! * Tramadol 2)  ! Aspirin  Past History:  Past Medical History: Reviewed history from 02/08/2007 and no changes required. Diabetes mellitus, type II Hypertension Rheumatoid arthritis, sees Dr. Lovena Neighbours and Dr. 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    Physical Exam  General:  Well-developed,well-nourished,in no acute distress;  alert,appropriate and cooperative throughout examination Head:  Normocephalic and atraumatic without obvious abnormalities. No apparent alopecia or balding. Eyes:  No corneal or conjunctival inflammation noted. EOMI. Perrla. Funduscopic exam benign, without hemorrhages, exudates or papilledema. Vision grossly normal. Ears:  External ear exam shows no significant lesions or deformities.  Otoscopic examination reveals clear canals, tympanic membranes are intact bilaterally without bulging, retraction, inflammation or discharge. Hearing is grossly normal bilaterally. Nose:  External nasal examination shows no deformity or inflammation. Nasal mucosa are pink and moist without lesions or exudates. Mouth:  Oral mucosa and oropharynx without lesions or exudates.  Teeth in good repair. Neck:  No deformities, masses, or tenderness noted. Lungs:  Normal respiratory effort, chest expands symmetrically. Lungs are clear to auscultation, no crackles or wheezes.   Impression & Recommendations:  Problem # 1:  ACUTE SINUSITIS, UNSPECIFIED (ICD-461.9)  Her updated medication list for this problem includes:    Zithromax Z-pak 250 Mg Tabs (Azithromycin) .[name]... As directed  Problem # 2:  DIABETES MELLITUS, TYPE II (ICD-250.00)  The following medications were removed from the medication list:    Januvia 100 Mg Tabs (Sitagliptin phosphate) ..... Once daily Her updated medication list for this problem includes:    Lisinopril-hydrochlorothiazide 20-25 Mg Tabs (Lisinopril-hydrochlorothiazide) ..... Once daily    Avandia 2 Mg Tabs (Rosiglitazone maleate) .[name]..[name] Two times a day    Actos 30 Mg Tabs (Pioglitazone hcl) ..... Once daily  Problem # 3:  HYPERTENSION (ICD-401.9)  Her updated medication list for this problem includes:    Lisinopril-hydrochlorothiazide 20-25 Mg Tabs (Lisinopril-hydrochlorothiazide) ..... Once daily    Furosemide 20  Mg Tabs (Furosemide) ..... Once daily as needed fluid  Complete Medication  List: 1)  Plaquenil 200 Mg Tabs (Hydroxychloroquine sulfate) .[name].. 1 by mouth two times a day 2)  Lisinopril-hydrochlorothiazide 20-25 Mg Tabs (Lisinopril-hydrochlorothiazide) .... Once daily 3)  Avandia 2 Mg Tabs (Rosiglitazone maleate) .... Two times a day 4)  Nexium 40 Mg Cpdr (Esomeprazole magnesium) .... Once daily 5)  Furosemide 20 Mg Tabs (Furosemide) .... Once daily as needed fluid 6)  Actos 30 Mg Tabs (Pioglitazone hcl) .... Once daily 7)  Zithromax Z-pak 250 Mg Tabs (Azithromycin) .... As directed  Patient Instructions: 1)  Stop Avandia due to side effect concerns, and switch to Actos. Try a Zpack for the sinuses. Use saline nasal sprays.  Prescriptions: ZITHROMAX Z-PAK 250 MG TABS (AZITHROMYCIN) as directed  #1 x 0   Entered and Authorized by:   [name] MD   Signed by:   [name] MD on 02/12/2009   Method used:   Print then Give to Patient   RxID:   [id number] ACTOS 30 MG TABS (PIOGLITAZONE HCL) once daily  #30 x 11   Entered and Authorized by:   [name] MD   Signed by:   [name] MD on 02/12/2009   Method used:   Print then Give to Patient   RxID:   [id number]

## 2010-04-02 NOTE — Letter (Signed)
Summary: [facility]   Imported By: Maryln Gottron 01/01/2009 14:39:36  _____________________________________________________________________  External Attachment:    Type:   Image     Comment:   External Document

## 2010-04-02 NOTE — Letter (Signed)
Summary: [facility]  [facility]   Imported By: Maryln Gottron 04/12/2008 15:34:08  _____________________________________________________________________  External Attachment:    Type:   Image     Comment:   External Document

## 2010-04-02 NOTE — Assessment & Plan Note (Signed)
Summary: CPX/RCD/PT RESCD/CCM   Vital Signs:  Patient Profile:   65 Years Old Wendy [name] Height:     63.5 inches Weight:      180 pounds Temp:     97.9 degrees F oral Pulse rate:   88 / minute Pulse rhythm:   regular BP sitting:   114 / 82  (left arm) Cuff size:   regular  Vitals Entered By: Alfred Levins, CMA (February 08, 2007 1:54 PM)                 Chief Complaint:  cpx.  History of Present Illness: 65 yr old Wendy [name] for cpx. Doing well except for stiff joints and back pain. Does not exercise at all.  Current Allergies: ! * TRAMADOL ! ASPIRIN  Past Medical History:    Reviewed history from 11/05/2006 and no changes required:       Diabetes mellitus, type II       Hypertension       Rheumatoid arthritis, sees Dr. Lovena Neighbours and Dr. 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        Hepatitis C, sees GI at [facility]       GERD  Past Surgical History:    Reviewed history from 11/05/2006 and no changes required:       colonoscopy 2004, hemmorrhoids only   Family History:    Reviewed history and no changes required:       Family History of Arthritis       Family History Diabetes 1st degree relative       Family History Hypertension       Family History Lung cancer  Social History:    Reviewed history and no changes required:       Occupation:part-time CNA       Single       Current Smoker       Alcohol use-no   Risk Factors:  Tobacco use:  current Alcohol use:  no   Review of Systems      See HPI   Physical Exam  General:     overweight-appearing.   Head:     Normocephalic and atraumatic without obvious abnormalities. No apparent alopecia or balding. Eyes:     No corneal or conjunctival inflammation noted. EOMI. Perrla. Funduscopic exam benign, without hemorrhages, exudates or papilledema. Vision grossly normal. Ears:     External ear exam shows no significant lesions or deformities.  Otoscopic examination reveals clear canals, tympanic  membranes are intact bilaterally without bulging, retraction, inflammation or discharge. Hearing is grossly normal bilaterally. Nose:     External nasal examination shows no deformity or inflammation. Nasal mucosa are pink and moist without lesions or exudates. Mouth:     Oral mucosa and oropharynx without lesions or exudates.  Teeth in good repair. Neck:     No deformities, masses, or tenderness noted. Breasts:     No mass, nodules, thickening, tenderness, bulging, retraction, inflamation, nipple discharge or skin changes noted.   Lungs:     Normal respiratory effort, chest expands symmetrically. Lungs are clear to auscultation, no crackles or wheezes. Heart:     Normal rate and regular rhythm. S1 and S2 normal without gallop, murmur, click, rub or other extra sounds. EKG normal. Abdomen:     Bowel sounds positive,abdomen soft and non-tender without masses, organomegaly or hernias noted. Rectal:     No external abnormalities noted. Normal sphincter tone. No rectal masses or  tenderness. Heme neg. Genitalia:     Pelvic Exam:        External: normal Wendy [name] genitalia without lesions or masses        Vagina: normal without lesions or masses        Cervix: normal without lesions or masses        Adnexa: normal bimanual exam without masses or fullness        Uterus: normal by palpation        Pap smear: performed Msk:     No deformity or scoliosis noted of thoracic or lumbar spine.   Pulses:     R and L carotid,radial,femoral,dorsalis pedis and posterior tibial pulses are full and equal bilaterally Extremities:     No clubbing, cyanosis, edema, or deformity noted with normal full range of motion of all joints.   Neurologic:     No cranial nerve deficits noted. Station and gait are normal. Plantar reflexes are down-going bilaterally. DTRs are symmetrical throughout. Sensory, motor and coordinative functions appear intact. Skin:     Intact without suspicious lesions or rashes Cervical  Nodes:     No lymphadenopathy noted Axillary Nodes:     No palpable lymphadenopathy Inguinal Nodes:     No significant adenopathy Psych:     Cognition and judgment appear intact. Alert and cooperative with normal attention span and concentration. No apparent delusions, illusions, hallucinations    Impression & Recommendations:  Problem # 1:  PHYSICAL EXAMINATION (ICD-V70.0)  lot [id number], EXP 30 jun 09, san[employer]i pasteur left deltoid IM, 0.5 cc. Orders: EKG w/ Interpretation (93000)   Complete Medication List: 1)  Prilosec 20 Mg Cpdr (Omeprazole) .[name].. 1 by mouth once daily 2)  Plaquenil 200 Mg Tabs (Hydroxychloroquine sulfate) .[name].. 1 by mouth two times a day 3)  Diflucan 150 Mg Tabs (Fluconazole) .... As directed 4)  Lisinopril-hydrochlorothiazide 20-25 Mg Tabs (Lisinopril-hydrochlorothiazide) .... Once daily 5)  Avandia 2 Mg Tabs (Rosiglitazone maleate) .... Two times a day 6)  Januvia 100 Mg Tabs (Sitagliptin phosphate) .... Once daily 7)  Nexium 40 Mg Cpdr (Esomeprazole magnesium) .... Once daily  Other Orders: Influenza Vaccine NON MCR (16109)   Patient Instructions: 1)  It is important that you exercise regularly at least 20 minutes 5 times a week. If you develop chest pain, have severe difficulty breathing, or feel very tired , stop exercising immediately and seek medical attention. 2)  You need to lose weight. Consider a lower calorie diet and regular exercise.  3)  Please schedule a follow-up appointment in 6 months. 4)  Schedule your mammogram.    Prescriptions: JANUVIA 100 MG  TABS (SITAGLIPTIN PHOSPHATE) once daily  #30 x 11   Entered and Authorized by:   [name] MD   Signed by:   [name] MD on 02/08/2007   Method used:   Print then Give to Patient   RxID:   [id number]  ]  Influenza Vaccine    Vaccine Type: Fluvax Non-MCR    Given by: Alfred Levins, CMA  Flu Vaccine Consent Questions    Do you have a history of severe allergic reactions  to this vaccine? no    Any prior history of allergic reactions to egg and/or gelatin? no    Do you have a sensitivity to the preservative Thimersol? no    Do you have a past history of Guillan-Barre Syndrome? no    Do you currently have an acute febrile illness? no    Have you  ever had a severe reaction to latex? no    Vaccine information given and explained to patient? yes    Are you currently pregnant? no

## 2010-04-02 NOTE — Letter (Signed)
Summary: Results Follow-up Letter  [facility]  [address] [place], [place] [address]   Phone: [phone]  Fax: [phone]    02/11/2007  [address] [place], [place]  [address]  Dear Ms. Reid,   The following are the results of your recent test(s):  Test     Result     Pap Smear    Normal__X____  Not Normal_____       Comments: _________________________________________________________ Cholesterol LDL(Bad cholesterol):          Your goal is less than:         HDL (Good cholesterol):        Your goal is more than: _________________________________________________________ Other Tests:   _________________________________________________________  Please call for an appointment Or _____Repeat in 2 years___________________________________ _________________________________________________________ _________________________________________________________  Sincerely,  Gershon Crane, MD [place] [facility]

## 2010-04-02 NOTE — Consult Note (Signed)
[facility]   Imported By: Kassie Mends 05/05/2007 08:26:01  _____________________________________________________________________  External Attachment:    Type:   Image     Comment:   [facility]

## 2010-04-02 NOTE — Assessment & Plan Note (Signed)
Summary: 2 wk rov/njr   Vital Signs:  Patient Profile:   65 Years Old Female Weight:      169 pounds (76.82 kg) Temp:     98.1 degrees F (36.72 degrees C) oral BP sitting:   142 / 108  (left arm) Cuff size:   regular  Vitals Entered By: Alfred Levins, CMA (November 20, 2006 11:55 AM)                 Chief Complaint:  dm check up.  History of Present Illness: Here to follow up newly diagnosed DM and HTN. BP has been up some but she thinks this is due to feeling sick to her stomach. Ever since starting Avandamet she has had a lot of nausea. Watching her diet much better.  Current Allergies: ! * TRAMADOL ! ASPIRIN  Past Medical History:    Reviewed history from 11/05/2006 and no changes required:       Diabetes mellitus, type II       Hypertension       Rheumatoid arthritis     Review of Systems      See HPI   Physical Exam  General:     Well-developed,well-nourished,in no acute distress; alert,appropriate and cooperative throughout examination. 2 hr PP glucose is 201.    Impression & Recommendations:  Problem # 1:  HYPERTENSION (ICD-401.9)  The following medications were removed from the medication list:    Lisinopril 20 Mg Tabs (Lisinopril) ..... Once daily  Her updated medication list for this problem includes:    Lisinopril-hydrochlorothiazide 20-25 Mg Tabs (Lisinopril-hydrochlorothiazide) ..... Once daily   Problem # 2:  DIABETES MELLITUS, TYPE II (ICD-250.00)  The following medications were removed from the medication list:    Avandamet 2-500 Mg Tabs (Rosiglitazone-metformin) .[name]..[name] Two times a day    Lisinopril 20 Mg Tabs (Lisinopril) ..... Once daily  Her updated medication list for this problem includes:    Lisinopril-hydrochlorothiazide 20-25 Mg Tabs (Lisinopril-hydrochlorothiazide) ..... Once daily    Avandia 2 Mg Tabs (Rosiglitazone maleate) .[name]..[name] Two times a day    Januvia 100 Mg Tabs (Sitagliptin phosphate) ..... Once daily   Problem #  3:  GERD (ICD-530.81)  Her updated medication list for this problem includes:    Prilosec 20 Mg Cpdr (Omeprazole) .[name]... 1 by mouth once daily    Nexium 40 Mg Cpdr (Esomeprazole magnesium) ..... Once daily   Complete Medication List: 1)  Prilosec 20 Mg Cpdr (Omeprazole) .[name].. 1 by mouth once daily 2)  Plaquenil 200 Mg Tabs (Hydroxychloroquine sulfate) .[name].. 1 by mouth two times a day 3)  Diflucan 150 Mg Tabs (Fluconazole) .... As directed 4)  Lisinopril-hydrochlorothiazide 20-25 Mg Tabs (Lisinopril-hydrochlorothiazide) .... Once daily 5)  Avandia 2 Mg Tabs (Rosiglitazone maleate) .... Two times a day 6)  Januvia 100 Mg Tabs (Sitagliptin phosphate) .... Once daily 7)  Nexium 40 Mg Cpdr (Esomeprazole magnesium) .... Once daily   Patient Instructions: 1)  cpx in next few weeks, will follow these up then    Prescriptions: NEXIUM 40 MG  CPDR (ESOMEPRAZOLE MAGNESIUM) once daily  #30 x 11   Entered and Authorized by:   [name] MD   Signed by:   [name] MD on 11/20/2006   Method used:   Print then Give to Patient   RxID:   [id number] AVANDIA 2 MG  TABS (ROSIGLITAZONE MALEATE) two times a day  #60 x 11   Entered and Authorized by:   [name]  MD   Signed by:   [name] MD on 11/20/2006   Method used:   Print then Give to Patient   RxID:   [id number] LISINOPRIL-HYDROCHLOROTHIAZIDE 20-25 MG  TABS (LISINOPRIL-HYDROCHLOROTHIAZIDE) once daily  #30 x 11   Entered and Authorized by:   [name] MD   Signed by:   [name] MD on 11/20/2006   Method used:   Print then Give to Patient   RxID:   [id number]  ]

## 2010-04-02 NOTE — Letter (Signed)
Summary: [facility]  [facility]   Imported By: Maryln Gottron 10/19/2008 10:08:27  _____________________________________________________________________  External Attachment:    Type:   Image     Comment:   External Document

## 2010-04-02 NOTE — Medication Information (Signed)
Summary: Order for Diabetic Testing Supplies  Order for Diabetic Testing Supplies   Imported By: Maryln Gottron 08/20/2009 13:42:04  _____________________________________________________________________  External Attachment:    Type:   Image     Comment:   External Document

## 2010-04-02 NOTE — Progress Notes (Signed)
Summary: needs another referral to physical therapist  Phone Note Call from Patient Call back at Home Phone [phone]   Caller: pt live Call For: [name]  Summary of Call: Needs to go to the physical therapist.  It has been over thirty days and she needs another referral faxed to them to be able to make an appt.    Initial call taken by: Roselle Locus,  September 02, 2007 12:02 PM  Follow-up for Phone Call        Pt informed Dr [name] out of the office for the remainder of the holiday weekend.  Pt voiced her understanding, this can wait until Monday.Sid Falcon LPN  September 01, 1476 1:11 PM     Additional Follow-up for Phone Call Additional follow up Details #1::        please get this approved for another month if needed Additional Follow-up by: [name] MD,  September 02, 2007 1:56 PM

## 2010-04-02 NOTE — Procedures (Signed)
Summary: Colonoscopy, EGD/[facility]  Colonoscopy, EGD/[facility]   Imported By: Maryln Gottron 06/23/2008 13:03:23  _____________________________________________________________________  External Attachment:    Type:   Image     Comment:   External Document

## 2010-04-02 NOTE — Letter (Signed)
Summary: [facility] PT d/c summary  [facility] d/c summary   Imported By: Kassie Mends 10/27/2007 13:52:23  _____________________________________________________________________  External Attachment:    Type:   Image     Comment:   [facility] d/c summary

## 2010-04-02 NOTE — Assessment & Plan Note (Signed)
Summary: HYPERGLYCEMIA CONCERNS // RS   Vital Signs:  Patient profile:   65 year old female Weight:      185 pounds O2 Sat:      97 % Temp:     98.3 degrees F Pulse rate:   100 / minute BP sitting:   114 / 90  (left arm) Cuff size:   regular  Vitals Entered By: Pura Spice, RN (November 07, 2009 2:45 PM) CC: went ot [facility]ursday for elevated BS    History of Present Illness: Here to follow up an ER visit on 11-01-09 for blood glucoses in the 500s and 600s. Her exam at that time was normal, and her labs were all normal except for glucoses as above and a creatinie of 1.56. Of course she was dehydrated at that time, and she received IV fluids. Sh has continued on Glipizide only, and she tries to watch her diet. Her glucoses at home are in the 300s and 400s now. She feels tired a lot, but no other symptoms. Her last A1c here in January was 6.5, so her diabetes has worsened fairly rapidly.   Allergies: 1)  ! * Tramadol 2)  ! Aspirin  Past History:  Past Medical History: Reviewed history from 02/08/2007 and no changes required. Diabetes mellitus, type II Hypertension Rheumatoid arthritis, sees Dr. Lovena Neighbours and Dr. 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    Physical Exam  General:  overweight-appearing.   Neck:  No deformities, masses, or tenderness noted. Lungs:  Normal respiratory effort, chest  expands symmetrically. Lungs are clear to auscultation, no crackles or wheezes. Heart:  Normal rate and regular rhythm. S1 and S2 normal without gallop, murmur, click, rub or other extra sounds.   Impression & Recommendations:  Problem # 1:  DIABETES MELLITUS, TYPE II (ICD-250.00)  Her updated medication list for this problem includes:    Lisinopril-hydrochlorothiazide 20-25 Mg Tabs (Lisinopril-hydrochlorothiazide) ..... Once daily    Glucotrol 10 Mg Tabs (Glipizide) .[name]..[name] Two times a day    Metformin Hcl 1000 Mg Tabs (Metformin hcl) .[name]..[name] Two times a day  Orders: Nutrition Referral (Nutrition)  Problem # 2:  HYPERTENSION (ICD-401.9)  Her updated medication list for this problem includes:    Lisinopril-hydrochlorothiazide 20-25 Mg Tabs (Lisinopril-hydrochlorothiazide) ..... Once daily    Furosemide 20 Mg Tabs (Furosemide) ..... Once daily as needed fluid  Complete Medication List: 1)  Plaquenil 200 Mg Tabs (Hydroxychloroquine sulfate) .[name].. 1 by mouth two times a day 2)  Lisinopril-hydrochlorothiazide 20-25 Mg Tabs (Lisinopril-hydrochlorothiazide) .... Once daily 3)  Furosemide 20 Mg Tabs (Furosemide) .... Once daily as needed fluid 4)  Glucotrol 10 Mg Tabs (Glipizide) .... Two times a day 5)  Omeprazole 40 Mg Cpdr (Omeprazole) .... Once daily 6)  Vicodin 5-500 Mg Tabs (Hydrocodone-acetaminophen) .[name].. 1 q 6 as needed for pain 7)  Metformin Hcl 1000 Mg Tabs (Metformin hcl) .... Two times a day  Patient Instructions: 1)  Add Metformin two times a day . refer to Nutrition.  2)  Please schedule a follow-up appointment in 2  weeks.  Prescriptions: METFORMIN HCL 1000 MG TABS (METFORMIN HCL) two times a day  #60 x 11   Entered and Authorized by:   [name] MD   Signed by:   [name] MD on 11/07/2009   Method used:   Print then Give to Patient   RxID:   [id number]

## 2010-04-02 NOTE — Miscellaneous (Signed)
Summary: Rehab Report  Rehab Report   Imported By: Kassie Mends 10/01/2007 08:05:28  _____________________________________________________________________  External Attachment:    Type:   Image     Comment:   rehab

## 2010-04-02 NOTE — Progress Notes (Signed)
Summary: REFILL REQUEST (Vicodin)  Phone Note Refill Request Call back at [phone]   Refills Requested: Medication #1:  VICODIN 5-500 MG TABS 1 q 6 as needed for pain.   Notes: [facility] [address]    Initial call taken by: Debbra Riding,  Jul 25, 2009 11:36 AM  Follow-up for Phone Call        call in #120 with 5 rf Follow-up by: [name] MD,  Jul 25, 2009 5:28 PM  Additional Follow-up for Phone Call Additional follow up Details #1::        Rx called to pharmacy Additional Follow-up by: Raechel Ache, RN,  Jul 25, 2009 5:34 PM    Prescriptions: VICODIN 5-500 MG TABS (HYDROCODONE-ACETAMINOPHEN) 1 q 6 as needed for pain  #120 x 5   Entered by:   Raechel Ache, RN   Authorized by:   [name] MD   Signed by:   Raechel Ache, RN on 07/25/2009   Method used:   Historical   RxID:   [id number]

## 2010-04-02 NOTE — Consult Note (Signed)
[facility]   Imported By: Kassie Mends 12/13/2007 12:57:35  _____________________________________________________________________  External Attachment:    Type:   Image     Comment:   [facility]

## 2010-04-02 NOTE — Consult Note (Signed)
Summary: [facility]  [facility]   Imported By: Maryln Gottron 11/29/2009 14:03:53  _____________________________________________________________________  External Attachment:    Type:   Image     Comment:   External Document

## 2010-04-02 NOTE — Letter (Signed)
Summary: [facility]  [facility]   Imported By: Maryln Gottron 12/24/2007 13:17:37  _____________________________________________________________________  External Attachment:    Type:   Image     Comment:   External Document

## 2010-04-02 NOTE — Progress Notes (Signed)
Summary: Metformin is making her nauseated  Phone Note Call from Patient   Caller: Patient Call For: [name] MD Summary of Call: Metformin is making her nauseated. [facility] [phone]  Initial call taken by: Lynann Beaver CMA,  November 09, 2009 12:56 PM  Follow-up for Phone Call        decrease metformin to once daily with largest meal Follow-up by: Gordy Savers  MD,  November 09, 2009 4:00 PM  Additional Follow-up for Phone Call Additional follow up Details #1::        Notified pt. Additional Follow-up by: Lynann Beaver CMA,  November 09, 2009 4:15 PM    New/Updated Medications: METFORMIN HCL 1000 MG TABS (METFORMIN HCL) one by mouth daily

## 2010-04-02 NOTE — Letter (Signed)
Summary: Dr. Turner Daniels office note  Dr. Turner Daniels office note   Imported By: Kassie Mends 10/26/2006 15:04:00  _____________________________________________________________________  External Attachment:    Type:   Image     Comment:   Dr. Turner Daniels office note

## 2010-04-02 NOTE — Assessment & Plan Note (Signed)
Summary: PAIN IN LEG AND HIP/CJR   Vital Signs:  Patient profile:   65 year old female Weight:      201 pounds Temp:     98.7 degrees F oral BP sitting:   122 / 100  (left arm) Cuff size:   large  Vitals Entered By: Alfred Levins, CMA (March 30, 2009 2:39 PM) CC: rt hip pain   History of Present Illness: Here for several reasons. First her arthritis pains are worse in the hips and knees. She would like to have some stronger pain meds to use. She wants to get off Actos and onto a generic DM med because of cost and because the Actos makes her gain weight. She wants to switch to a generic GERD med. Lastly she wants a rx for Chantix to quit smoking.   Allergies: 1)  ! * Tramadol 2)  ! Aspirin  Past History:  Past Medical History: Reviewed history from 02/08/2007 and no changes required. Diabetes mellitus, type II Hypertension Rheumatoid arthritis, sees Dr. Lovena Neighbours and Dr. 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  Hepatitis C, sees GI at [facility] GERD  Review of Systems  The patient denies anorexia, fever, weight loss, weight gain, vision loss, decreased hearing, hoarseness, chest pain, syncope, dyspnea on exertion, peripheral edema, prolonged cough, headaches, hemoptysis, abdominal pain, melena, hematochezia, severe indigestion/heartburn, hematuria, incontinence, genital sores, muscle weakness, suspicious skin lesions, transient blindness, difficulty walking, depression, unusual weight change, abnormal bleeding, enlarged lymph nodes, angioedema, breast masses, and testicular masses.    Physical Exam  General:  Well-developed,well-nourished,in no acute distress; alert,appropriate and cooperative throughout examination Msk:  hips and knees are unremarkable   Impression & Recommendations:  Problem # 1:  DIABETES MELLITUS, TYPE II (ICD-250.00)  The following medications were removed from the medication list:    Avandia 2 Mg Tabs (Rosiglitazone maleate) .[name]..[name] Two times  a day    Actos 30 Mg Tabs (Pioglitazone hcl) ..... Once daily Her updated medication list for this problem includes:    Lisinopril-hydrochlorothiazide 20-25 Mg Tabs (Lisinopril-hydrochlorothiazide) ..... Once daily    Glucotrol 10 Mg Tabs (Glipizide) .[name]..[name] Two times a day  Orders: Venipuncture (45409) TLB-BMP (Basic Metabolic Panel-BMET) (80048-METABOL) TLB-CBC Platelet - w/Differential (85025-CBCD) TLB-Hepatic/Liver Function Pnl (80076-HEPATIC) TLB-TSH (Thyroid Stimulating Hormone) (84443-TSH) TLB-A1C / Hgb A1C (Glycohemoglobin) (83036-A1C) TLB-Microalbumin/Creat Ratio, Urine (82043-MALB)  Problem # 2:  GERD (ICD-530.81)  The following medications were removed from the medication list:    Nexium 40 Mg Cpdr (Esomeprazole magnesium) ..... Once daily Her updated medication list for this problem includes:    Omeprazole 40 Mg Cpdr (Omeprazole) ..... Once daily  Problem # 3:  RHEUMATOID ARTHRITIS (ICD-714.0)  Problem # 4:  HYPERTENSION (ICD-401.9)  Her updated medication list for this problem includes:    Lisinopril-hydrochlorothiazide 20-25 Mg Tabs (Lisinopril-hydrochlorothiazide) ..... Once daily    Furosemide 20 Mg Tabs (Furosemide) ..... Once daily as needed fluid  Complete Medication List: 1)  Plaquenil 200 Mg Tabs (Hydroxychloroquine sulfate) .[name].. 1 by mouth two times a day 2)  Lisinopril-hydrochlorothiazide 20-25 Mg Tabs (Lisinopril-hydrochlorothiazide) .... Once daily 3)  Furosemide 20 Mg Tabs (Furosemide) .... Once daily as needed fluid 4)  Glucotrol 10 Mg Tabs (Glipizide) .... Two times a day 5)  Omeprazole 40 Mg Cpdr (Omeprazole) .... Once daily 6)  Vicodin 5-500 Mg Tabs (Hydrocodone-acetaminophen) .[name].. 1 q 6 as needed for pain 7)  Chantix Starting Month Pak 0.5 Mg X 11 & 1 Mg X 42 Tabs (Varenicline tartrate) .... As directed  8)  Chantix Continuing Month Pak 1 Mg Tabs (Varenicline tartrate) .... As directed  Patient Instructions: 1)  Make med changes as above. Get labs  today including an A1c. Start on Chantix.  Prescriptions: CHANTIX CONTINUING MONTH PAK 1 MG TABS (VARENICLINE TARTRATE) as directed  #1 x 1   Entered and Authorized by:   [name] MD   Signed by:   [name] MD on 03/30/2009   Method used:   Print then Give to Patient   RxID:   [id number] CHANTIX STARTING MONTH PAK 0.5 MG X 11 & 1 MG X 42 TABS (VARENICLINE TARTRATE) as directed  #1 x 0   Entered and Authorized by:   [name] MD   Signed by:   [name] MD on 03/30/2009   Method used:   Print then Give to Patient   RxID:   [id number] VICODIN 5-500 MG TABS (HYDROCODONE-ACETAMINOPHEN) 1 q 6 as needed for pain  #120 x 3   Entered and Authorized by:   [name] MD   Signed by:   [name] MD on 03/30/2009   Method used:   Print then Give to Patient   RxID:   [id number] OMEPRAZOLE 40 MG CPDR (OMEPRAZOLE) once daily  #30 x 11   Entered and Authorized by:   [name] MD   Signed by:   [name] MD on 03/30/2009   Method used:   Print then Give to Patient   RxID:   [id number] GLUCOTROL 10 MG TABS (GLIPIZIDE) two times a day  #60 x 11   Entered and Authorized by:   [name] MD   Signed by:   [name] MD on 03/30/2009   Method used:   Print then Give to Patient   RxID:   [id number]

## 2010-04-02 NOTE — Progress Notes (Signed)
Summary: rx [facility] day supply [facility]   Phone Note From Pharmacy   Caller: [facility]  Summary of Call: refills requested 90 day for hydrocodone 5/500 lisinopril hctz, omeprazole, januvia, metformin,furosemide. Initial call taken by: Pura Spice, RN,  January 21, 2010 1:41 PM  Follow-up for Phone Call        before I refill these, she needs to see me again. Her diabetes was way out of control when we checked her in September.  Follow-up by: [name] MD,  January 21, 2010 2:16 PM  Additional Follow-up for Phone Call Additional follow up Details #1::        pt aware appt sch at her request nov 28 at 330   Additional Follow-up by: Pura Spice, RN,  January 21, 2010 3:26 PM

## 2010-04-02 NOTE — Consult Note (Signed)
Summary: [facility]  [facility]   Imported By: Maryln Gottron 11/28/2009 09:34:59  _____________________________________________________________________  External Attachment:    Type:   Image     Comment:   External Document

## 2010-04-02 NOTE — Consult Note (Signed)
Summary: [facility]  [facility]   Imported By: Maryln Gottron 09/26/2009 13:30:34  _____________________________________________________________________  External Attachment:    Type:   Image     Comment:   External Document

## 2010-04-02 NOTE — Consult Note (Signed)
[facility]   Imported By: Kassie Mends 11/18/2007 13:20:11  _____________________________________________________________________  External Attachment:    Type:   Image     Comment:   [facility]

## 2010-04-02 NOTE — Progress Notes (Signed)
Summary: [facility] called req status of Diabetic Shoes   Phone Note From Other Clinic Call back at [phone] ext [phone] Candice   Caller: [facility]  -  Candice Summary of Call: [facility] called to check on status of the req for pts diabetic shoes. Form has been faxed 3 times and have not gotten a response. Pls call asap.  Initial call taken by: Lucy Antigua,  June 04, 2009 8:55 AM  Follow-up for Phone Call        toDr. [name] to advise -  Follow-up by: Duard Brady LPN,  June 05, 2009 4:00 PM  Additional Follow-up for Phone Call Additional follow up Details #1::        I do not write for diabetic shoes. we have faxed this back to them 3 times. 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  Additional Follow-up by: [name] MD,  June 05, 2009 4:29 PM    Additional Follow-up for Phone Call Additional follow up Details #2::    noted  KIK Follow-up by: Duard Brady LPN,  June 07, 2009 8:18 AM

## 2010-04-02 NOTE — Assessment & Plan Note (Signed)
Summary: 2 wk follow up/cb/pt rsc/cjr   Vital Signs:  Patient profile:   65 year old female Weight:      187 pounds Temp:     98.5 degrees F BP sitting:   130 / 82  Vitals Entered By: Pura Spice, RN (December 03, 2009 1:14 PM) CC: wants hgb a 1 c ck'd    History of Present Illness: Here to follow up on DM and HTN. She feels fine in general,although Metformin often makes her stomach feel upset. She has decreased the dose of this from two times a day to once a day. Watches her diet. her BP is stable.   Allergies: 1)  ! * Tramadol 2)  ! Aspirin  Past History:  Past Medical History: Reviewed history from 02/08/2007 and no changes required. Diabetes mellitus, type II Hypertension Rheumatoid arthritis, sees Dr. Lovena Neighbours and Dr. 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    Physical Exam  General:  Well-developed,well-nourished,in no acute distress; alert,appropriate and cooperative throughout examination Neck:  No deformities, masses, or tenderness noted. Lungs:  Normal respiratory effort, chest expands symmetrically. Lungs are clear to auscultation, no crackles or wheezes. Heart:  Normal rate and regular rhythm. S1 and S2 normal without gallop, murmur, click, rub or other extra sounds.   Impression & Recommendations:  Problem # 1:  DIABETES MELLITUS, TYPE II (ICD-250.00)  Her updated medication  list for this problem includes:    Lisinopril-hydrochlorothiazide 20-25 Mg Tabs (Lisinopril-hydrochlorothiazide) ..... Once daily    Glucotrol 10 Mg Tabs (Glipizide) .[name]..[name] Two times a day    Metformin Hcl 1000 Mg Tabs (Metformin hcl) ..... One by mouth daily  Orders: Venipuncture (16109) TLB-BMP (Basic Metabolic Panel-BMET) (80048-METABOL) TLB-CBC Platelet - w/Differential (85025-CBCD) TLB-Hepatic/Liver Function Pnl (80076-HEPATIC) TLB-TSH (Thyroid Stimulating Hormone) (84443-TSH) TLB-A1C / Hgb A1C (Glycohemoglobin) (83036-A1C)  Problem # 2:  HYPERTENSION (ICD-401.9)  Her updated medication list for this problem includes:    Lisinopril-hydrochlorothiazide 20-25 Mg Tabs (Lisinopril-hydrochlorothiazide) ..... Once daily    Furosemide 20 Mg Tabs (Furosemide) ..... Once daily as needed fluid  Complete Medication List: 1)  Plaquenil 200 Mg Tabs (Hydroxychloroquine sulfate) .[name].. 1 by mouth two times a day 2)  Lisinopril-hydrochlorothiazide 20-25 Mg Tabs (Lisinopril-hydrochlorothiazide) .... Once daily 3)  Furosemide 20 Mg Tabs (Furosemide) .... Once daily as needed fluid 4)  Glucotrol 10 Mg Tabs (Glipizide) .... Two times a day 5)  Omeprazole 40 Mg Cpdr (Omeprazole) .... Once daily 6)  Vicodin 5-500 Mg Tabs (Hydrocodone-acetaminophen) .[name].. 1 q 6 as needed for pain 7)  Metformin Hcl 1000 Mg Tabs (Metformin hcl) .... One by mouth daily  Patient Instructions: 1)  check labs including an A1c  Appended Document: Orders Update    Clinical Lists Changes  Orders: Added new Service order of Specimen Handling (60454) - Signed

## 2010-04-02 NOTE — Assessment & Plan Note (Signed)
Summary: refill meds/gh   Vital Signs:  Patient profile:   65 year old female Weight:      188 pounds O2 Sat:      97 % Temp:     98.9 degrees F Pulse rate:   88 / minute BP sitting:   120 / 90  (left arm) Cuff size:   regular  Vitals Entered By: Pura Spice, RN (January 28, 2010 3:52 PM) CC: f/u diabetes refill vicodin   History of Present Illness: Here for follow up on diabetes and for refills. At our last visit in October her A1c was 11, and she admits that she was eating the wrong foods then, drinking too many sodas, etc. We added Januvia to her regimen, and she has made tremendous changes in ther diet. She feels better now with more energy. Her glucoses have ranged from 70 to 130 fasting. She still sees Dr. Lovena Neighbours for her arthritis, but he has asked [place] to provide her pain meds.   Allergies: 1)  ! * Tramadol 2)  ! Aspirin  Past History:  Past Medical History: Diabetes mellitus, type II Hypertension Rheumatoid arthritis, sees Dr. 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    Physical Exam  General:  Well-developed,well-nourished,in no acute distress; alert,appropriate and cooperative throughout examination Lungs:  Normal respiratory effort, chest expands symmetrically. Lungs are clear to auscultation, no crackles or wheezes. Heart:  Normal rate and regular rhythm. S1 and  S2 normal without gallop, murmur, click, rub or other extra sounds.   Impression & Recommendations:  Problem # 1:  DIABETES MELLITUS, TYPE II (ICD-250.00)  Her updated medication list for this problem includes:    Lisinopril-hydrochlorothiazide 20-25 Mg Tabs (Lisinopril-hydrochlorothiazide) ..... Once daily    Glucotrol 10 Mg Tabs (Glipizide) .[name]..[name] Two times a day    Metformin Hcl 1000 Mg Tabs (Metformin hcl) ..... One by mouth daily    Januvia 100 Mg Tabs (Sitagliptin phosphate) .[name]... 1 by mouth once daily  Problem # 2:  HYPERTENSION (ICD-401.9)  Her updated medication list for this problem includes:    Lisinopril-hydrochlorothiazide 20-25 Mg Tabs (Lisinopril-hydrochlorothiazide) ..... Once daily    Furosemide 20 Mg Tabs (Furosemide) ..... Once daily as needed fluid  Problem # 3:  RHEUMATOID ARTHRITIS (ICD-714.0)  Complete Medication List: 1)  Plaquenil 200 Mg Tabs (Hydroxychloroquine sulfate) .[name].. 1 by mouth two times a day 2)  Lisinopril-hydrochlorothiazide 20-25 Mg Tabs (Lisinopril-hydrochlorothiazide) .... Once daily 3)  Furosemide 20 Mg Tabs (Furosemide) .... Once daily as needed fluid 4)  Glucotrol 10 Mg Tabs (Glipizide) .... Two times a day 5)  Omeprazole 40 Mg Cpdr (Omeprazole) .... Once daily 6)  Vicodin 5-500 Mg Tabs (Hydrocodone-acetaminophen) .[name].. 1 q 6 as needed for pain 7)  Metformin Hcl 1000 Mg Tabs (Metformin hcl) .... One by mouth daily 8)  Januvia 100 Mg Tabs (Sitagliptin phosphate) .[name].. 1 by mouth once daily  Patient Instructions: 1)  Refilled her Vicodin. She will continue her current meds. Her cpx is coming up in January, so we will check another  A1c at that time. Prescriptions: VICODIN 5-500 MG TABS (HYDROCODONE-ACETAMINOPHEN) 1 q 6 as needed for pain  #120 x 5   Entered and Authorized by:   [name] MD   Signed by:   [name] MD on 01/28/2010   Method used:   Print then Give to Patient   RxID:   [id number]    Orders Added: 1)  Est.  Patient Level IV [14782]

## 2010-04-02 NOTE — Progress Notes (Signed)
Summary: refills  Phone Note Refill Request Message from:  Fax from Pharmacy  Refills Requested: Medication #1:  FUROSEMIDE 20 MG  TABS once daily as needed fluid   Last Refilled: 12/22/2008  Medication #2:  LISINOPRIL-HYDROCHLOROTHIAZIDE 20-25 MG  TABS once daily   Last Refilled: 12/22/2008  Medication #3:  NEXIUM 40 MG  CPDR once daily   Last Refilled: 12/22/2008 [facility] ph----[phone]      fax---[phone]  Initial call taken by: Warnell Forester,  March 09, 2009 12:56 PM  Follow-up for Phone Call        Rx faxed to pharmacy Follow-up by: Alfred Levins, CMA,  March 09, 2009 3:59 PM    Prescriptions: FUROSEMIDE 20 MG  TABS (FUROSEMIDE) once daily as needed fluid  #30 x 11   Entered by:   Alfred Levins, CMA   Authorized by:   [name] MD   Signed by:   Alfred Levins, CMA on 03/09/2009   Method used:   Faxed to ...       [facility] (retail)       [address]       [place], [place]  [address]       Ph: [phone]       Fax: [phone]   RxID:   [id number] NEXIUM 40 MG  CPDR (ESOMEPRAZOLE MAGNESIUM) once daily  #30 x 11   Entered by:   Alfred Levins, CMA   Authorized by:   [name] MD   Signed by:   Alfred Levins, CMA on 03/09/2009   Method used:   Faxed to ...       [facility] (retail)       [address]       [place], [place]  [address]       Ph: [phone]       Fax: [phone]   RxID:   [id number] LISINOPRIL-HYDROCHLOROTHIAZIDE 20-25 MG  TABS (LISINOPRIL-HYDROCHLOROTHIAZIDE) once daily  #30 x 11   Entered by:   Alfred Levins, CMA   Authorized by:   [name] MD   Signed by:   Alfred Levins, CMA on 03/09/2009   Method used:   Faxed to ...       [facility] (retail)       [address]       [place], [place]  [address]       Ph: [phone]       Fax: [phone]   RxID:   [id number]

## 2010-04-02 NOTE — Letter (Signed)
Summary: [facility]   [facility]   Imported By: Maryln Gottron 12/26/2009 15:12:12  _____________________________________________________________________  External Attachment:    Type:   Image     Comment:   External Document

## 2010-04-02 NOTE — Consult Note (Signed)
[facility]   Imported By: Kassie Mends 10/25/2007 13:43:10  _____________________________________________________________________  External Attachment:    Type:   Image     Comment:   [facility]

## 2010-04-03 ENCOUNTER — Ambulatory Visit: Admit: 2010-04-03 | Payer: Self-pay | Admitting: Family Medicine

## 2010-04-03 ENCOUNTER — Ambulatory Visit: Payer: Self-pay | Admitting: Family Medicine

## 2010-05-07 ENCOUNTER — Other Ambulatory Visit: Payer: Self-pay | Admitting: Family Medicine

## 2010-05-14 ENCOUNTER — Other Ambulatory Visit: Payer: Self-pay | Admitting: Family Medicine

## 2010-05-16 LAB — URINE MICROSCOPIC-ADD ON

## 2010-05-16 LAB — DIFFERENTIAL
Basophils Absolute: 0 10*3/uL (ref 0.0–0.1)
Basophils Relative: 0 % (ref 0–1)
Eosinophils Absolute: 0.1 10*3/uL (ref 0.0–0.7)
Eosinophils Relative: 1 % (ref 0–5)
Lymphocytes Relative: 29 % (ref 12–46)
Lymphs Abs: 2.1 10*3/uL (ref 0.7–4.0)
Monocytes Absolute: 0.9 10*3/uL (ref 0.1–1.0)
Monocytes Relative: 13 % — ABNORMAL HIGH (ref 3–12)
Neutro Abs: 4.2 10*3/uL (ref 1.7–7.7)
Neutrophils Relative %: 57 % (ref 43–77)

## 2010-05-16 LAB — COMPREHENSIVE METABOLIC PANEL
ALT: 27 U/L (ref 0–35)
AST: 28 U/L (ref 0–37)
Albumin: 3.5 g/dL (ref 3.5–5.2)
Alkaline Phosphatase: 87 U/L (ref 39–117)
BUN: 25 mg/dL — ABNORMAL HIGH (ref 6–23)
CO2: 32 mEq/L (ref 19–32)
Calcium: 9.6 mg/dL (ref 8.4–10.5)
Chloride: 94 mEq/L — ABNORMAL LOW (ref 96–112)
Creatinine, Ser: 1.56 mg/dL — ABNORMAL HIGH (ref 0.4–1.2)
GFR calc Af Amer: 41 mL/min — ABNORMAL LOW (ref >60)
GFR calc non Af Amer: 34 mL/min — ABNORMAL LOW (ref >60)
Glucose, Bld: 522 mg/dL — ABNORMAL HIGH (ref 70–99)
Potassium: 3.6 mEq/L (ref 3.5–5.1)
Sodium: 134 mEq/L — ABNORMAL LOW (ref 135–145)
Total Bilirubin: 0.8 mg/dL (ref 0.3–1.2)
Total Protein: 8.1 g/dL (ref 6.0–8.3)

## 2010-05-16 LAB — URINALYSIS, ROUTINE W REFLEX MICROSCOPIC
Bilirubin Urine: NEGATIVE
Glucose, UA: >1000 mg/dL — AB
Hgb urine dipstick: NEGATIVE
Ketones, ur: NEGATIVE mg/dL
Leukocytes, UA: NEGATIVE
Nitrite: NEGATIVE
Protein, ur: NEGATIVE mg/dL
Specific Gravity, Urine: 1.026 (ref 1.005–1.030)
Urobilinogen, UA: 1 mg/dL (ref 0.0–1.0)
pH: 5.5 (ref 5.0–8.0)

## 2010-05-16 LAB — CBC
HCT: 40.3 % (ref 36.0–46.0)
Hemoglobin: 13.6 g/dL (ref 12.0–15.0)
MCH: 26.9 pg (ref 26.0–34.0)
MCHC: 33.7 g/dL (ref 30.0–36.0)
MCV: 79.8 fL (ref 78.0–100.0)
Platelets: 164 10*3/uL (ref 150–400)
RBC: 5.05 MIL/uL (ref 3.87–5.11)
RDW: 14.1 % (ref 11.5–15.5)
WBC: 7.3 10*3/uL (ref 4.0–10.5)

## 2010-05-16 LAB — GLUCOSE, CAPILLARY
Glucose-Capillary: 379 mg/dL — ABNORMAL HIGH (ref 70–99)
Glucose-Capillary: 488 mg/dL — ABNORMAL HIGH (ref 70–99)
Glucose-Capillary: 519 mg/dL — ABNORMAL HIGH (ref 70–99)

## 2010-05-16 LAB — POCT CARDIAC MARKERS
CKMB, poc: <1 ng/mL — ABNORMAL LOW (ref 1.0–8.0)
Myoglobin, poc: 195 ng/mL (ref 12–200)
Troponin i, poc: <0.05 ng/mL (ref 0.00–0.09)

## 2010-05-16 LAB — KETONES, QUALITATIVE: Acetone, Bld: NEGATIVE

## 2010-05-16 LAB — LIPASE, BLOOD: Lipase: 36 U/L (ref 11–59)

## 2010-05-20 ENCOUNTER — Encounter: Payer: Self-pay | Admitting: Family Medicine

## 2010-06-05 ENCOUNTER — Encounter: Payer: Self-pay | Admitting: Family Medicine

## 2010-06-05 ENCOUNTER — Ambulatory Visit (INDEPENDENT_AMBULATORY_CARE_PROVIDER_SITE_OTHER): Payer: Medicare PPO | Admitting: Family Medicine

## 2010-06-05 VITALS — BP 120/92 | Ht 65.0 in | Wt 190.0 lb

## 2010-06-05 DIAGNOSIS — I10 Essential (primary) hypertension: Secondary | ICD-10-CM

## 2010-06-05 DIAGNOSIS — L989 Disorder of the skin and subcutaneous tissue, unspecified: Secondary | ICD-10-CM

## 2010-06-05 DIAGNOSIS — E119 Type 2 diabetes mellitus without complications: Secondary | ICD-10-CM

## 2010-06-05 DIAGNOSIS — Z0000 Encounter for general adult medical examination without abnormal findings: Secondary | ICD-10-CM

## 2010-06-05 LAB — CBC WITH DIFFERENTIAL/PLATELET
Basophils Absolute: 0 10*3/uL (ref 0.0–0.1)
Basophils Relative: 0.5 % (ref 0.0–3.0)
Eosinophils Absolute: 0.1 10*3/uL (ref 0.0–0.7)
Eosinophils Relative: 1.3 % (ref 0.0–5.0)
HCT: 38.4 % (ref 36.0–46.0)
Hemoglobin: 12.9 g/dL (ref 12.0–15.0)
Lymphocytes Relative: 31.6 % (ref 12.0–46.0)
Lymphs Abs: 1.9 10*3/uL (ref 0.7–4.0)
MCHC: 33.5 g/dL (ref 30.0–36.0)
MCV: 82.5 fl (ref 78.0–100.0)
Monocytes Absolute: 1 10*3/uL (ref 0.1–1.0)
Monocytes Relative: 15.4 % — ABNORMAL HIGH (ref 3.0–12.0)
Neutro Abs: 3.2 10*3/uL (ref 1.4–7.7)
Neutrophils Relative %: 51.2 % (ref 43.0–77.0)
Platelets: 183 10*3/uL (ref 150.0–400.0)
RBC: 4.66 Mil/uL (ref 3.87–5.11)
RDW: 15 % — ABNORMAL HIGH (ref 11.5–14.6)
WBC: 6.2 10*3/uL (ref 4.5–10.5)

## 2010-06-05 LAB — POCT URINALYSIS DIPSTICK
Glucose, UA: NEGATIVE
Ketones, UA: NEGATIVE
Leukocytes, UA: NEGATIVE
Nitrite, UA: NEGATIVE
Spec Grav, UA: 1.03
Urobilinogen, UA: 1
pH, UA: 5

## 2010-06-05 LAB — HEPATIC FUNCTION PANEL
ALT: 30 U/L (ref 0–35)
AST: 28 U/L (ref 0–37)
Albumin: 3.5 g/dL (ref 3.5–5.2)
Alkaline Phosphatase: 60 U/L (ref 39–117)
Bilirubin, Direct: 0.2 mg/dL (ref 0.0–0.3)
Total Bilirubin: 0.9 mg/dL (ref 0.3–1.2)
Total Protein: 8 g/dL (ref 6.0–8.3)

## 2010-06-05 LAB — LIPID PANEL
Cholesterol: 202 mg/dL — ABNORMAL HIGH (ref 0–200)
HDL: 48.3 mg/dL (ref >39.00)
Total CHOL/HDL Ratio: 4
Triglycerides: 62 mg/dL (ref 0.0–149.0)
VLDL: 12.4 mg/dL (ref 0.0–40.0)

## 2010-06-05 LAB — BASIC METABOLIC PANEL
BUN: 17 mg/dL (ref 6–23)
CO2: 30 mEq/L (ref 19–32)
Calcium: 9.8 mg/dL (ref 8.4–10.5)
Chloride: 103 mEq/L (ref 96–112)
Creatinine, Ser: 0.9 mg/dL (ref 0.4–1.2)
GFR: 78.21 mL/min (ref >60.00)
Glucose, Bld: 128 mg/dL — ABNORMAL HIGH (ref 70–99)
Potassium: 4.4 mEq/L (ref 3.5–5.1)
Sodium: 142 mEq/L (ref 135–145)

## 2010-06-05 LAB — TSH: TSH: 1.56 u[IU]/mL (ref 0.35–5.50)

## 2010-06-05 LAB — HEMOGLOBIN A1C: Hgb A1c MFr Bld: 7.9 % — ABNORMAL HIGH (ref 4.6–6.5)

## 2010-06-05 LAB — LDL CHOLESTEROL, DIRECT: Direct LDL: 143.6 mg/dL

## 2010-06-05 MED ORDER — HYDROCODONE-ACETAMINOPHEN 5-500 MG PO TABS: 1.0000 | ORAL_TABLET | Freq: Four times a day (QID) | ORAL | Status: DC | PRN

## 2010-06-05 MED ORDER — GLIPIZIDE 10 MG PO TABS: 10.0000 mg | ORAL_TABLET | Freq: Two times a day (BID) | ORAL | Status: DC

## 2010-06-05 MED ORDER — FUROSEMIDE 20 MG PO TABS: 20.0000 mg | ORAL_TABLET | Freq: Every day | ORAL | Status: DC

## 2010-06-05 MED ORDER — SITAGLIPTIN PHOSPHATE 100 MG PO TABS: 100.0000 mg | ORAL_TABLET | Freq: Every day | ORAL | Status: DC

## 2010-06-05 MED ORDER — METFORMIN HCL 1000 MG PO TABS: 1000.0000 mg | ORAL_TABLET | Freq: Every day | ORAL | Status: DC

## 2010-06-05 MED ORDER — LISINOPRIL-HYDROCHLOROTHIAZIDE 20-25 MG PO TABS: 1.0000 | ORAL_TABLET | Freq: Every day | ORAL | Status: DC

## 2010-06-05 NOTE — Progress Notes (Signed)
  Subjective:    Patient ID: Wendy [name], female    DOB: September 13, 1945, 65 y.o.   MRN: [id number]  HPI 65 yr old female for a cpx. She feels well in general. Tries to walk some but exercise is difficult due to her back and joint pains. Her am fasting glucoses have been stable in the range of 70-120. She has had a tender spot on her right 3rd finger for over a year, and she wants it removed. No chest pains or SOB.    Review of Systems  Constitutional: Negative.   HENT: Negative.   Eyes: Negative.   Respiratory: Negative.   Cardiovascular: Negative.   Gastrointestinal: Negative.   Genitourinary: Negative for dysuria, urgency, frequency, hematuria, flank pain, decreased urine volume, enuresis, difficulty urinating, pelvic pain and dyspareunia.  Musculoskeletal: Negative.   Skin: Negative.   Neurological: Negative.   Hematological: Negative.   Psychiatric/Behavioral: Negative.        Objective:   Physical Exam  Constitutional: She is oriented to person, place, and time. She appears well-developed and well-nourished. No distress.  HENT:  Head: Normocephalic and atraumatic.  Right Ear: External ear normal.  Left Ear: External ear normal.  Nose: Nose normal.  Mouth/Throat: Oropharynx is clear and moist. No oropharyngeal exudate.  Eyes: Conjunctivae and EOM are normal. Pupils are equal, round, and reactive to light. No scleral icterus.  Neck: Normal range of motion. Neck supple. No JVD present. No thyromegaly present.  Cardiovascular: Normal rate, regular rhythm, normal heart sounds and intact distal pulses.  Exam reveals no gallop and no friction rub.   No murmur heard.      EKG normal   Pulmonary/Chest: Effort normal and breath sounds normal. No respiratory distress. She has no wheezes. She has no rales. She exhibits no tenderness.  Abdominal: Soft. Bowel sounds are normal. She exhibits no distension and no mass. There is no tenderness. There is no rebound and no guarding.   Genitourinary: No breast swelling, tenderness, discharge or bleeding.  Musculoskeletal: Normal range of motion. She exhibits no edema and no tenderness.  Lymphadenopathy:    She has no cervical adenopathy.  Neurological: She is alert and oriented to person, place, and time. She has normal reflexes. No cranial nerve deficit. She exhibits normal muscle tone. Coordination normal.  Skin: Skin is warm and dry. No rash noted. No erythema.       There is a hard tender callused lesion on the right 3rd finger  Psychiatric: She has a normal mood and affect. Her behavior is normal. Judgment and thought content normal.          Assessment & Plan:  Get fasting labs today. Refer to dermatology to excise this lesion

## 2010-06-06 ENCOUNTER — Telehealth: Payer: Self-pay

## 2010-06-06 NOTE — Telephone Encounter (Signed)
Message copied by Madison Hickman on Thu Jun 06, 2010  1:56 PM ------      Message from: Dwaine Deter      Created: Thu Jun 06, 2010  1:32 PM       Normal except for her diabetes. This is much better controlled than it was 6 months ago, but could be a little better. We will discuss at the next OV

## 2010-06-06 NOTE — Telephone Encounter (Signed)
Pt aware of test results

## 2010-07-03 ENCOUNTER — Other Ambulatory Visit: Payer: Self-pay | Admitting: Dermatology

## 2010-07-16 NOTE — Op Note (Signed)
Wendy [name], KNUPP              ACCOUNT NO.:  [id number]   MEDICAL RECORD NO.:  [id number]          PATIENT TYPE:  AMB   LOCATION:  DSC                          FACILITY:  [facility]   PHYSICIAN:  Feliberto Gottron. Turner Daniels, M.D.   DATE OF BIRTH:  [birth year]   DATE OF PROCEDURE:  12/28/2006  DATE OF DISCHARGE:                               OPERATIVE REPORT   PREOPERATIVE DIAGNOSIS:  Right shoulder impingement syndrome, AC joint  arthritis and possible rotator cuff tear.   POSTOPERATIVE DIAGNOSIS:  Right shoulder impingement syndrome with  subacromial spur, AC joint arthritis and partial thickness internal  leaflet rotator cuff tear.   PROCEDURE:  Right shoulder arthroscopic anterior-inferior acromioplasty,  distal clavicle excision formal and debridement of internal leaflet  rotator cuff tear and incidental degenerative tearing of the superior  labrum.   SURGEON:  Feliberto Gottron.  Turner Daniels, MD.   FIRST ASSISTANT:  None.   ANESTHETIC:  Interscalene block plus general endotracheal.   ESTIMATED BLOOD LOSS:  Minimal.   FLUID REPLACEMENT:  800 mL crystalloid.   DRAINS PLACED:  None.   TOURNIQUET TIME:  None.   She did receive 1 gram of Ancef preoperatively because of a recent  diagnosis of hypoglycemia   INDICATIONS FOR PROCEDURE:  A 65 year old woman who has been treated for  bilateral shoulder impingement syndrome for the last couple of months  got good temporary relief from cortisone injections after failing anti-  inflammatory medicines and Thera-Band exercises. She desires elective  arthroscopic evaluation and treatment of her right shoulder first for a  type 3 subacromial spur that is quite impressive on x-ray as well as AC  joint arthritis including gas in the [facility] joint. The risks and benefits of  surgery were discussed, questions answered.   DESCRIPTION OF PROCEDURE:  The patient identified by armband, taken to  the operating room at [facility].  Appropriate anesthetic   monitors were attached and right shoulder interscalene block anesthesia  induced.  This was followed by general endotracheal anesthesia and she  was then placed in the beach chair position and was prepped and draped  in the usual sterile fashion from the wrist to the hemithorax.  Using a  #11 blade, standard portals were then made 1.5 cm anterior to the [facility]  joint, lateral to the junction of the  middle and posterior thirds of  the acromion and posterior to the posterolateral corner of the acromion  process.  The inflow was placed anteriorly, the arthroscope laterally  and a 4.2 great white sucker shaver posteriorly.  Diagnostic arthroscopy  was then undertaken and a subacromial bursectomy accomplished. This  outlined the subacromial spur which was quite sharp as well as the end  stage arthritic AC joint. Using a 4.5 hooded vortex bur, the spur was  removed converting the acromion to a type 1 acromion.  The [facility] joint was  addressed and a distal clavicle excision accomplished. Switching portals  halfway through bringing the bur in anteriorly, the scope posteriorly  and the inflow laterally. The bleeders were cauterized with the  underwater hook Bovie.  The external  leaflet of the rotator cuff was  noted to be intact with no full-thickness tearing noted. At this point,  the arthroscope was repositioned into the glenohumeral joint using the  posterior portal. We  documented minimal chondromalacia grade 2 of the  glenoid, some degenerative tearing of the labrum which was mildly  debrided as well as partial thickness tearing of the supraspinatus  insertion under 50% and this was debrided back to a stable margin.  At  this point, the shoulder was irrigated out with normal saline solution.  The arthroscopic instruments removed and a dressing of Xeroform 4x4  dressing, sponges, paper tape and a sling applied.  The patient was laid  supine, awakened and taken to the recovery room without  difficulty.      Feliberto Gottron. Turner Daniels, M.D.  Electronically Signed     FJR/MEDQ  D:  12/28/2006  T:  12/29/2006  Job:  [id number]

## 2010-07-19 NOTE — Assessment & Plan Note (Signed)
[facility] OFFICE NOTE   NAME:[name], [name] SPIKER                     MRN:          [id number]  DATE:09/12/2005                            DOB:          Sep 17, 1945    This is a 65 year old woman here to establish with our practice.  Primarily,  she is asking for refills on our blood pressure medications.  She has  several ongoing health problems.  1.  She has rheumatoid arthritis and is followed by two rheumatologists at      [facility] by the name of Dr. Lovena [name] and Dr. Reed [name].  She has      tried any number of oral pain medications with little improvement.  Her      rheumatologists are now fighting with her insurance company to try to      get coverage for Enbrel, but thus far [employer] has not      wanted to cover this.  She also has chronic hepatitis C and sees a      gastroenterologist at [facility] on a yearly basis.  She cannot      remember the doctor's name.  She does say that it seems to be quite      stable and not really changing.  She also has hypertension, but ran out      of her medications one week ago.  Other past medical history:  She had      been seen by Dr. Audria [name] at [facility] up until two years ago.  She      has been without any type of primary care physician since then, however.      She is a G5, P2 having had two vaginal deliveries.  Her last Pap smear      was at [facility] in 2005 and was normal.  She did have a normal      mammogram in April of this year, however.  She had some rectal bleeding      in 2004.  This was worked up with a colonoscopy.  It was found to be due      to internal hemorrhoids only.  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  At that time she took a long course of antibiotics and has been      screened with chest x-rays early ever since.  She has had a chest x-ray      within the past one year and was told it was fine.  She  has never had a      surgery.   ALLERGIES:  TRAMADOL, ASPIRIN.   CURRENT MEDICATIONS:  1.  Norvasc 10 mg per day.  2.  HCTZ 25 mg per day.  3.  Zantac 300 mg once a day.  4.  Garlic pills daily.  5.  Nasacort sprays daily.   HABITS:  She does not use alcohol.  She smokes three or four cigarettes a  day and has smoked for many years.   SOCIAL HISTORY:  She is single.  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  She averages about six hours per week.   FAMILY HISTORY:  Remarkable for arthritis, lung cancer, diabetes, and  hypertension.   OBJECTIVE:  VITAL SIGNS:  Height 5 feet 4 inches, weight 164, blood pressure  180/100, pulse 60 and regular.  GENERAL:  She is in no acute distress.  She is somewhat overweight.  NECK:  Supple without lymphadenopathy or masses.  LUNGS:  Clear.  CARDIAC:  Rate and rhythm regular without gallops, murmurs, rubs.  Distal  pulses are full.  EXTREMITIES:  No edema.  She walks and gets on the examination table a  little stiffly, but without assistance.   ASSESSMENT AND PLAN:  1.  Hypertension.  I refilled her medications as above.  Will try to get her      past records sent to [place] and will get some laboratories this morning.  2.  Rheumatoid arthritis.  She will follow up with [facility].  3.  Hepatitis C.  She will follow up with [facility].                                   Tera Mater. [name], MD   SAF/MedQ  DD:  09/12/2005  DT:  09/12/2005  Job #:  [id number]

## 2010-07-19 NOTE — Consult Note (Signed)
NAMEARRIYAH, MADEJ              ACCOUNT NO.:  [id number]   MEDICAL RECORD NO.:  [id number]          PATIENT TYPE:  REC   LOCATION:  FOOT                         FACILITY:  [facility]   PHYSICIAN:  Jonelle Sports. Cheryll Cockayne, M.D. DATE OF BIRTH:  April 05, 1945   DATE OF CONSULTATION:  07/08/2005  DATE OF DISCHARGE:                                   CONSULTATION   This 65 year old black female comes referred from fast-track at the [facility] for treatment of a wound that began acutely some two weeks  ago, but has failed to heal despite usual management.   The patient at that time sustained a laceration of the distal pretibial area  on the right transverse linear laceration when portion of a table slid and  struck her in that area.  She reported to the emergency room acutely at that  time and the lesion was sutured with 11 sutures.  Although she had no  evidence of wound infection or anything of that sort when the sutures were  removed 11 days later the wound dehisced and indeed appeared not to be  moving toward healing.  She was at that point placed on cephalexin and given  a referral here.   Past medical history is really fairly innocent.  She does have hepatitis C,  but not to the extent of having required any treatment.  She is hypertensive  on medication.  She has had laminectomy previously and also some type of  bowel surgery and reports that her healing from those surgeries were  satisfactory, although the most recent of those was in 1995.  She does smoke  a pack of cigarettes per day and this may be a factor in her healing.  Regular medications include Norvasc 10 mg daily, hydrochlorothiazide 25 mg  daily, Clarinex p.r.n., Motrin 800 mg p.r.n.  She has no known medicinal  allergies.   EXAMINATION:  Examination today is limited to the distal lower extremity.  The extremities are without deformity or without significant edema.  Pulses  are clearly palpable and the extremity in  general is unremarkable.  In the  distal pretibial area there is a transverse laceration measuring 4.3 x 0.4 x  0.3 cm in dimensions and with its base filled with a fairly adherent  purulofibrolent slough.  There is no spreading inflammation for the wound  and no signs of any active infection at this time.   IMPRESSION:  Traumatic wound left lower extremity which failed to heal  initially and has now converted to a chronic wound.   DISPOSITION:  1.  The wound is debrided without anesthesia to the extent that the patient      can tolerate to remove a goodly portion of the slough from the base of      the wound.  Some slough does persist, however, after this debridement.  2.  The wound is treated with an application of Panafil and covered with an      absorptive dressing.  3.  The patient is advised to redress the wound every two days, cleansing it  at that time with warm soapy water, rinsing it well, reapplying the      Panafil and an appropriate absorptive and protective dressing as we have      done.  4.  It is discussed with the patient that the wound has changed chemistries      by virtue of now being a chronic wound and that re-suturing it will not      be possible but that we do anticipate healing.  5.  The patient is advised to complete the cephalexin which she has on hand      which should last approximately three additional days.  6.  Follow-up visit here is given for 10 days.           ______________________________  Jonelle Sports Cheryll Cockayne, M.D.     RES/MEDQ  D:  07/08/2005  T:  07/09/2005  Job:  [id number]

## 2010-07-19 NOTE — Assessment & Plan Note (Signed)
[facility]   NAME:  Wendy [name]              ACCOUNT NO.:  [id number]   MEDICAL RECORD NO.:  [id number]      DATE OF BIRTH:  02-Sep-1945   PHYSICIAN:  Theresia Majors. Tanda Rockers, M.D. VISIT DATE:  07/25/2005                                     OFFICE VISIT   VITAL SIGNS:  Her vital signs are stable.  She is afebrile.   [name] is a 65 year old lady who returns for follow up of a traumatic  injury of her left distal anterior shin.  She had been managed with a  compression wrap.  In the interim, she has removed the wrap.   WOUND EXAM:  Inspection of the wound shows that there is scant serous  drainage.  The wound measurements have markedly decreased with the total  volume less than 0.2 cm sq.  There is a 99% reepitheliazation.   ASSESSMENT:  Resolve wound.   MANAGEMENT PLAN & GOAL:  We will see the patient on a p.r.n. basis.  We have  instructed her to buy over-the-counter __________extra support hose.  We  will see her on a p.r.n. basis.           ______________________________  Theresia Majors Tanda Rockers, M.D.     Cephus Slater  D:  07/25/2005  T:  07/26/2005  Job:  [id number]

## 2010-07-19 NOTE — Discharge Summary (Signed)
[facility]  Patient:    Wendy [name], Wendy [name]                       MRN: [id number] Adm. Date:  [id number] Disc. Date: 01/10/00 Attending:  Learta Codding Dictator:   Abelino Derrick, P.A.C. [facility] CC:         [facility]   Referring Physician Discharge [facility]  DISCHARGE DIAGNOSES: 1. Chest pain, negative dobutamine echocardiogram this admission. 2. History of hypertension, currently normotensive off medicines. 3. History of smoking. 4. Family history of coronary disease.  HISTORY OF PRESENT ILLNESS:  The patient is a 65 year old female who was seen in the emergency room for chest pain.  She has a history of hypertension and had been on Norvasc in the past, but had not taken this for the last two weeks because she ran out.  She presented with chest pain which was somewhat atypical.  She does have risk factors for coronary disease.  HOSPITAL COURSE:  She was admitted to telemetry to rule out MI.  EKG showed sinus rhythm and nonspecific changes.  Troponins were negative.  She underwent a dobutamine echocardiogram on January 10, 2000, by Lewayne Bunting, M.D., and this was negative for ischemia.  She was discharged late on January 10, 2000.  DISCHARGE MEDICATIONS:  None.  She is currently normotensive and has a history of aspirin because of GI upset.  LABORATORY DATA:  CK-MBs and troponins are negative x 2.  Sodium 136, potassium 3.6, BUN 17, creatinine 0.8, glucose 97.  White count 5.1, hemoglobin 12.1, hematocrit 36.3, platelets 175.  INR 1.  A lipid profile is pending at the time of dictation.  EKG shows sinus rhythm with no acute changes and sinus bradycardia.  The chest x-ray shows no active disease.  DISPOSITION:  The patient is discharged in stable condition and needs to follow up with [facility].  When she sees them in follow-up, her lipids can be addressed at that time. DD:  01/10/00 TD:  01/10/00 Job: [id number]

## 2010-07-19 NOTE — Assessment & Plan Note (Signed)
[facility]   NAME:  Wendy [name], Wendy [name]              ACCOUNT NO.:  [id number]   MEDICAL RECORD NO.:  [id number]      DATE OF BIRTH:  1945/03/28   PHYSICIAN:  Theresia Majors. Tanda Rockers, M.D.      VISIT DATE:                                     OFFICE VISIT   Wendy [name] returns for follow up of a laceration of the left lower  extremity.  During the interim she has had a dressing intact with Panafil.  She continues to ambulate.  She has complained of some increased pain and  swelling and some drainage.  She denies fever.   OBJECTIVE:  Vital signs are stable.  She is afebrile.  The measurements of  the wound are 0.6 x 3.2 x 0.3 cm.  There is 2+ edema of the peri wound as  well as the pretibial area.  The pulses are 3+.  A full thickness  debridement was performed and a standard Unna boot utilizing domes pace was  placed without difficulty.   ASSESSMENT:  Traumatic injury having been converted to a stasis ulcer.   PLAN:  We will implement an Unna boot protocol starting today.  We will  reevaluate the patient in one week to assess her response to therapy.           ______________________________  Theresia Majors. Tanda Rockers, M.D.     Cephus Slater  D:  07/18/2005  T:  07/18/2005  Job:  [id number]

## 2010-12-11 LAB — POCT HEMOGLOBIN-HEMACUE
Hemoglobin: 13.1
Operator id: 208731

## 2010-12-13 LAB — POCT CARDIAC MARKERS
CKMB, poc: 1.1
Myoglobin, poc: 128
Operator id: 4295
Troponin i, poc: <0.05

## 2010-12-13 LAB — COMPREHENSIVE METABOLIC PANEL
ALT: 22
AST: 15
Albumin: 3.3 — ABNORMAL LOW
Alkaline Phosphatase: 122 — ABNORMAL HIGH
BUN: 10
CO2: 30
Calcium: 9.4
Chloride: 88 — ABNORMAL LOW
Creatinine, Ser: 1.26 — ABNORMAL HIGH
GFR calc Af Amer: 53 — ABNORMAL LOW
GFR calc non Af Amer: 43 — ABNORMAL LOW
Glucose, Bld: 1013
Potassium: 4.5
Sodium: 128 — ABNORMAL LOW
Total Bilirubin: 1.3 — ABNORMAL HIGH
Total Protein: 7.9

## 2010-12-13 LAB — CBC
HCT: 44
Hemoglobin: 14.6
MCHC: 33.2
MCV: 81.7
Platelets: 183
RBC: 5.39 — ABNORMAL HIGH
RDW: 14.4 — ABNORMAL HIGH
WBC: 7.3

## 2010-12-13 LAB — DIFFERENTIAL
Basophils Absolute: 0
Basophils Relative: 1
Eosinophils Absolute: 0
Eosinophils Relative: 0
Lymphocytes Relative: 14
Lymphs Abs: 1
Monocytes Absolute: 0.7
Monocytes Relative: 9
Neutro Abs: 5.5
Neutrophils Relative %: 76

## 2010-12-13 LAB — LIPASE, BLOOD: Lipase: 43

## 2010-12-27 ENCOUNTER — Other Ambulatory Visit: Payer: Self-pay | Admitting: Family Medicine

## 2010-12-27 NOTE — Telephone Encounter (Signed)
Pts spouse called and said that pt is needing refill of HYDROcodone-acetaminophen (VICODIN) 5-500 MG per tablet to [facility].

## 2010-12-30 MED ORDER — HYDROCODONE-ACETAMINOPHEN 5-500 MG PO TABS: 1.0000 | ORAL_TABLET | Freq: Four times a day (QID) | ORAL | Status: DC | PRN

## 2010-12-30 NOTE — Telephone Encounter (Signed)
Pt is aware med will be call into pharm °

## 2010-12-30 NOTE — Telephone Encounter (Signed)
Script called in

## 2010-12-30 NOTE — Telephone Encounter (Signed)
Call in #[phone] with 5 rf 

## 2011-02-05 ENCOUNTER — Other Ambulatory Visit: Payer: Self-pay | Admitting: Family Medicine

## 2011-02-05 NOTE — Telephone Encounter (Addendum)
Pt need new rx januvia 100mg  #90, lisinopril-htcz 20-25mg  #90, furosemide 20mg  #90, omeprazole 40mg  #90, glipizide 10mg  twice a day #180 and metformin 1000mg  #90 fax to [facility] [phone]. Pt ins # [id number]

## 2011-02-07 MED ORDER — GLIPIZIDE 10 MG PO TABS: 10.0000 mg | ORAL_TABLET | Freq: Two times a day (BID) | ORAL | Status: DC

## 2011-02-07 MED ORDER — SITAGLIPTIN PHOSPHATE 100 MG PO TABS: 100.0000 mg | ORAL_TABLET | Freq: Every day | ORAL | Status: DC

## 2011-02-07 MED ORDER — LISINOPRIL-HYDROCHLOROTHIAZIDE 20-25 MG PO TABS: 1.0000 | ORAL_TABLET | Freq: Every day | ORAL | Status: DC

## 2011-02-07 MED ORDER — FUROSEMIDE 20 MG PO TABS: 20.0000 mg | ORAL_TABLET | Freq: Every day | ORAL | Status: DC

## 2011-02-07 MED ORDER — METFORMIN HCL 1000 MG PO TABS: 1000.0000 mg | ORAL_TABLET | Freq: Every day | ORAL | Status: DC

## 2011-02-07 MED ORDER — OMEPRAZOLE 40 MG PO CPDR: 40.0000 mg | DELAYED_RELEASE_CAPSULE | Freq: Every day | ORAL | Status: DC

## 2011-02-07 NOTE — Telephone Encounter (Signed)
rx sent to [facility]

## 2011-03-27 ENCOUNTER — Telehealth: Payer: Self-pay | Admitting: Family Medicine

## 2011-03-27 NOTE — Telephone Encounter (Signed)
Left voice message with normal bone density scan results.

## 2011-04-03 ENCOUNTER — Encounter: Payer: Self-pay | Admitting: Family Medicine

## 2011-04-18 ENCOUNTER — Encounter: Payer: Self-pay | Admitting: Family Medicine

## 2011-05-28 ENCOUNTER — Encounter: Payer: Self-pay | Admitting: Family Medicine

## 2011-05-28 ENCOUNTER — Ambulatory Visit (INDEPENDENT_AMBULATORY_CARE_PROVIDER_SITE_OTHER): Payer: Medicare PPO | Admitting: Family Medicine

## 2011-05-28 VITALS — BP 130/90 | HR 86 | Temp 98.6°F | Wt 187.0 lb

## 2011-05-28 DIAGNOSIS — I10 Essential (primary) hypertension: Secondary | ICD-10-CM

## 2011-05-28 DIAGNOSIS — E119 Type 2 diabetes mellitus without complications: Secondary | ICD-10-CM

## 2011-05-28 NOTE — Progress Notes (Signed)
  Subjective:    Patient ID: Wendy [name], female    DOB: 1945/09/09, 66 y.o.   MRN: [id number]  HPI Here for follow up. She feels well in general. She has cut down on her smoking to 2-3 cigarettes a day. Her BP is stable.    Review of Systems  Constitutional: Negative.   Respiratory: Negative.   Cardiovascular: Negative.        Objective:   Physical Exam  Constitutional: She appears well-developed and well-nourished.  Cardiovascular: Normal rate, regular rhythm, normal heart sounds and intact distal pulses.   Pulmonary/Chest: Effort normal and breath sounds normal.          Assessment & Plan:  Set up fasting labs soon.

## 2011-06-05 ENCOUNTER — Other Ambulatory Visit (INDEPENDENT_AMBULATORY_CARE_PROVIDER_SITE_OTHER): Payer: Medicare PPO

## 2011-06-05 DIAGNOSIS — E119 Type 2 diabetes mellitus without complications: Secondary | ICD-10-CM

## 2011-06-05 LAB — CBC WITH DIFFERENTIAL/PLATELET
Basophils Absolute: 0 10*3/uL (ref 0.0–0.1)
Basophils Relative: 0.5 % (ref 0.0–3.0)
Eosinophils Absolute: 0 10*3/uL (ref 0.0–0.7)
Eosinophils Relative: 0.8 % (ref 0.0–5.0)
HCT: 36.7 % (ref 36.0–46.0)
Hemoglobin: 12 g/dL (ref 12.0–15.0)
Lymphocytes Relative: 26.4 % (ref 12.0–46.0)
Lymphs Abs: 1.3 10*3/uL (ref 0.7–4.0)
MCHC: 32.7 g/dL (ref 30.0–36.0)
MCV: 82.1 fl (ref 78.0–100.0)
Monocytes Absolute: 0.7 10*3/uL (ref 0.1–1.0)
Monocytes Relative: 14 % — ABNORMAL HIGH (ref 3.0–12.0)
Neutro Abs: 2.9 10*3/uL (ref 1.4–7.7)
Neutrophils Relative %: 58.3 % (ref 43.0–77.0)
Platelets: 169 10*3/uL (ref 150.0–400.0)
RBC: 4.47 Mil/uL (ref 3.87–5.11)
RDW: 16 % — ABNORMAL HIGH (ref 11.5–14.6)
WBC: 5 10*3/uL (ref 4.5–10.5)

## 2011-06-05 LAB — LIPID PANEL
Cholesterol: 168 mg/dL (ref 0–200)
HDL: 50.1 mg/dL (ref >39.00)
LDL Cholesterol: 105 mg/dL — ABNORMAL HIGH (ref 0–99)
Total CHOL/HDL Ratio: 3
Triglycerides: 64 mg/dL (ref 0.0–149.0)
VLDL: 12.8 mg/dL (ref 0.0–40.0)

## 2011-06-05 LAB — HEMOGLOBIN A1C: Hgb A1c MFr Bld: 7.4 % — ABNORMAL HIGH (ref 4.6–6.5)

## 2011-06-05 LAB — BASIC METABOLIC PANEL
BUN: 22 mg/dL (ref 6–23)
CO2: 28 mEq/L (ref 19–32)
Calcium: 9.1 mg/dL (ref 8.4–10.5)
Chloride: 105 mEq/L (ref 96–112)
Creatinine, Ser: 1.1 mg/dL (ref 0.4–1.2)
GFR: 66.31 mL/min (ref >60.00)
Glucose, Bld: 55 mg/dL — ABNORMAL LOW (ref 70–99)
Potassium: 4 mEq/L (ref 3.5–5.1)
Sodium: 142 mEq/L (ref 135–145)

## 2011-06-05 LAB — HEPATIC FUNCTION PANEL
ALT: 42 U/L — ABNORMAL HIGH (ref 0–35)
AST: 43 U/L — ABNORMAL HIGH (ref 0–37)
Albumin: 3.6 g/dL (ref 3.5–5.2)
Alkaline Phosphatase: 47 U/L (ref 39–117)
Bilirubin, Direct: 0.2 mg/dL (ref 0.0–0.3)
Total Bilirubin: 1 mg/dL (ref 0.3–1.2)
Total Protein: 7.9 g/dL (ref 6.0–8.3)

## 2011-06-05 LAB — TSH: TSH: 0.81 u[IU]/mL (ref 0.35–5.50)

## 2011-06-11 ENCOUNTER — Encounter: Payer: Self-pay | Admitting: Family Medicine

## 2011-06-11 NOTE — Progress Notes (Signed)
Quick Note:  I tried to reach pt by phone, no answer. I put a copy of results in mail. ______ 

## 2011-06-19 ENCOUNTER — Telehealth: Payer: Self-pay | Admitting: *Deleted

## 2011-06-19 NOTE — Telephone Encounter (Signed)
Pt returned call regarding 06/05/11 labs.  States she was having some things going on at home and was unable to return call.  Advised I was trying to call her regarding lab results and that Nettie Elm had mailed her a copy of her labs.  Pt did not receive copy of labs in mail.  Advised labs OK except for diabetes needs to be under better control.  Advised A1C is down, but still not at goal.  Kudos given for improvement and encouraged to keep up good exercise and watch diet to continue to lower this level.  Pt is pleased and voices agreement.

## 2011-06-25 ENCOUNTER — Telehealth: Payer: Self-pay | Admitting: Family Medicine

## 2011-06-25 NOTE — Telephone Encounter (Signed)
Refill request for Vicodin 5-500 mg take 1 po q6hrs prn and pt last here on 05/28/11.

## 2011-06-26 MED ORDER — HYDROCODONE-ACETAMINOPHEN 5-500 MG PO TABS: 1.0000 | ORAL_TABLET | Freq: Four times a day (QID) | ORAL | Status: DC | PRN

## 2011-06-26 NOTE — Telephone Encounter (Signed)
Script called in

## 2011-06-26 NOTE — Telephone Encounter (Signed)
Call in #[phone] with 5 rf 

## 2011-07-03 ENCOUNTER — Encounter: Payer: Self-pay | Admitting: Family Medicine

## 2011-07-03 ENCOUNTER — Telehealth: Payer: Self-pay | Admitting: Family Medicine

## 2011-07-03 ENCOUNTER — Ambulatory Visit (INDEPENDENT_AMBULATORY_CARE_PROVIDER_SITE_OTHER): Payer: Medicare PPO | Admitting: Family Medicine

## 2011-07-03 VITALS — BP 130/78 | HR 89 | Temp 98.8°F | Wt 184.0 lb

## 2011-07-03 DIAGNOSIS — J329 Chronic sinusitis, unspecified: Secondary | ICD-10-CM

## 2011-07-03 DIAGNOSIS — G4700 Insomnia, unspecified: Secondary | ICD-10-CM

## 2011-07-03 MED ORDER — AZITHROMYCIN 250 MG PO TABS: ORAL_TABLET | ORAL | Status: AC

## 2011-07-03 MED ORDER — ALPRAZOLAM 0.5 MG PO TABS: 0.5000 mg | ORAL_TABLET | Freq: Every evening | ORAL | Status: AC | PRN

## 2011-07-03 MED ORDER — HYDROCODONE-HOMATROPINE 5-1.5 MG/5ML PO SYRP: 5.0000 mL | ORAL_SOLUTION | ORAL | Status: AC | PRN

## 2011-07-03 NOTE — Progress Notes (Signed)
  Subjective:    Patient ID: Wendy [name], female    DOB: 02/01/1946, 66 y.o.   MRN: [id number]  HPI Here for one week of PND, ST, and a dry cough. No fever. She also complains of trouble sleeping. Her husband recently died of gastric cancer, and of course she has had a tough time dealing with this loss. It is hard to relax at night.    Review of Systems  Constitutional: Negative.   HENT: Positive for congestion, postnasal drip and sinus pressure.   Eyes: Negative.   Respiratory: Positive for cough.   Psychiatric/Behavioral: Positive for sleep disturbance.       Objective:   Physical Exam  Constitutional: She appears well-developed and well-nourished.  HENT:  Right Ear: External ear normal.  Left Ear: External ear normal.  Nose: Nose normal.  Mouth/Throat: Oropharynx is clear and moist. No oropharyngeal exudate.  Eyes: Conjunctivae are normal.  Pulmonary/Chest: Effort normal and breath sounds normal.  Lymphadenopathy:    She has no cervical adenopathy.          Assessment & Plan:  Treat the sinusitis. Try Xanax at bedtime for sleep

## 2011-07-03 NOTE — Telephone Encounter (Signed)
Pt insurance does not cover HYDROcodone-homatropine (HYDROMET) 5-1.5 MG/5ML syrup  Pt is requesting another med to be called in. Please contact

## 2011-07-04 NOTE — Telephone Encounter (Signed)
This is the least expensive cough med that I can think of. She either needs to pay cash for this or use OTC

## 2011-07-04 NOTE — Telephone Encounter (Signed)
I spoke with pt and she has already picked up the script.

## 2011-07-14 ENCOUNTER — Telehealth: Payer: Self-pay | Admitting: Family Medicine

## 2011-07-14 DIAGNOSIS — B1920 Unspecified viral hepatitis C without hepatic coma: Secondary | ICD-10-CM

## 2011-07-14 NOTE — Telephone Encounter (Signed)
Patient called stating that she was in to see her GI doctor in [place] and he wants her to have a colonoscopy. Due to lack of transportation, patient would like to be referred to a GI doctor in [place]. Please advise.

## 2011-07-15 NOTE — Telephone Encounter (Signed)
I left voice message with below message. 

## 2011-07-15 NOTE — Telephone Encounter (Signed)
Referral was done  

## 2011-08-25 ENCOUNTER — Ambulatory Visit (INDEPENDENT_AMBULATORY_CARE_PROVIDER_SITE_OTHER): Payer: Medicare PPO | Admitting: Family Medicine

## 2011-08-25 ENCOUNTER — Encounter: Payer: Self-pay | Admitting: Family Medicine

## 2011-08-25 VITALS — BP 128/80 | HR 88 | Temp 98.6°F | Wt 180.0 lb

## 2011-08-25 DIAGNOSIS — J329 Chronic sinusitis, unspecified: Secondary | ICD-10-CM

## 2011-08-25 DIAGNOSIS — E049 Nontoxic goiter, unspecified: Secondary | ICD-10-CM

## 2011-08-25 DIAGNOSIS — E042 Nontoxic multinodular goiter: Secondary | ICD-10-CM

## 2011-08-25 LAB — T3, FREE: T3, Free: 2.8 pg/mL (ref 2.3–4.2)

## 2011-08-25 LAB — T4, FREE: Free T4: 0.74 ng/dL (ref 0.60–1.60)

## 2011-08-25 LAB — TSH: TSH: 0.64 u[IU]/mL (ref 0.35–5.50)

## 2011-08-25 MED ORDER — AMOXICILLIN-POT CLAVULANATE 875-125 MG PO TABS: 1.0000 | ORAL_TABLET | Freq: Two times a day (BID) | ORAL | Status: AC

## 2011-08-25 MED ORDER — METFORMIN HCL 1000 MG PO TABS: 500.0000 mg | ORAL_TABLET | Freq: Two times a day (BID) | ORAL | Status: DC

## 2011-08-25 NOTE — Progress Notes (Signed)
  Subjective:    Patient ID: Wendy [name], female    DOB: 04/27/1945, 66 y.o.   MRN: [id number]  HPI Here for several reasons. First she still has some sinus symptoms with HA, PND, and a ST. She was here about 6 weeks ago for this and was given a Zpack. This helped but did not fully get rid of the infection. Second she has noticed the right side of her neck has been swollen for the past few months. No pain or trouble swallowing.    Review of Systems  Constitutional: Negative.   HENT: Negative.   Respiratory: Negative.        Objective:   Physical Exam  Constitutional: She appears well-developed and well-nourished.  HENT:  Right Ear: External ear normal.  Left Ear: External ear normal.  Nose: Nose normal.  Mouth/Throat: Oropharynx is clear and moist. No oropharyngeal exudate.  Neck: Neck supple.       Moderately enlargement of the right thyroid lobe. No tender, no nodules  Pulmonary/Chest: Effort normal and breath sounds normal.  Lymphadenopathy:    She has no cervical adenopathy.          Assessment & Plan:  Treat the sinusitis with Augmentin. Check a thyroid panel and set up an [place] soon.

## 2011-08-27 NOTE — Progress Notes (Signed)
Quick Note:  I left voice message with normal results. ______ 

## 2011-08-29 ENCOUNTER — Other Ambulatory Visit: Payer: Medicare PPO

## 2011-09-01 ENCOUNTER — Ambulatory Visit
Admission: RE | Admit: 2011-09-01 | Discharge: 2011-09-01 | Disposition: A | Discharge status: Home / Self Care | Payer: Medicare PPO | Source: Ambulatory Visit | Attending: Family Medicine | Admitting: Family Medicine

## 2011-09-01 DIAGNOSIS — E049 Nontoxic goiter, unspecified: Secondary | ICD-10-CM

## 2011-09-09 NOTE — Addendum Note (Signed)
Addended by: Gershon Crane A on: 09/09/2011 06:39 AM   Modules accepted: Orders

## 2011-09-15 ENCOUNTER — Telehealth: Payer: Self-pay | Admitting: Family Medicine

## 2011-09-15 NOTE — Telephone Encounter (Signed)
I did a referral for her to see ENT about her thyroid nodules. Ask Terri where she is with this

## 2011-09-15 NOTE — Telephone Encounter (Signed)
Pt called and is very confused she stated that she was supposed to have a recheck from an ultrasound she had done. Pt did have a scheduled appt with the [facility] on 10/01/11 pt is aware of this appt. Please contact pt to clarify

## 2011-09-15 NOTE — Telephone Encounter (Signed)
I spoke with pt and gave results, also she has appointment with ENT.

## 2011-09-15 NOTE — Telephone Encounter (Signed)
I spoke with pt and went over results again, also she has appointment with ENT.

## 2011-09-15 NOTE — Telephone Encounter (Signed)
Closed encounter on accident. Pt is aware of ENT appt, pt is scheduled for 10/01/11 but is confused, just needs clarification that the appt with the ENT is following up on the results from her ultra sound

## 2011-09-18 ENCOUNTER — Ambulatory Visit: Payer: Medicare PPO | Admitting: Family

## 2011-09-18 DIAGNOSIS — Z0289 Encounter for other administrative examinations: Secondary | ICD-10-CM

## 2011-10-09 ENCOUNTER — Other Ambulatory Visit: Payer: Self-pay | Admitting: Otolaryngology

## 2011-10-09 DIAGNOSIS — E042 Nontoxic multinodular goiter: Secondary | ICD-10-CM

## 2011-10-21 ENCOUNTER — Other Ambulatory Visit (HOSPITAL_COMMUNITY)
Admission: RE | Admit: 2011-10-21 | Discharge: 2011-10-21 | Disposition: A | Discharge status: Home / Self Care | Payer: Medicare PPO | Source: Ambulatory Visit | Attending: Interventional Radiology | Admitting: Interventional Radiology

## 2011-10-21 ENCOUNTER — Ambulatory Visit
Admission: RE | Admit: 2011-10-21 | Discharge: 2011-10-21 | Disposition: A | Discharge status: Home / Self Care | Payer: Medicare PPO | Source: Ambulatory Visit | Attending: Otolaryngology | Admitting: Otolaryngology

## 2011-10-21 DIAGNOSIS — E049 Nontoxic goiter, unspecified: Secondary | ICD-10-CM | POA: Insufficient documentation

## 2011-10-21 DIAGNOSIS — E042 Nontoxic multinodular goiter: Secondary | ICD-10-CM

## 2011-11-06 ENCOUNTER — Telehealth: Payer: Self-pay | Admitting: Family Medicine

## 2011-11-06 NOTE — Telephone Encounter (Signed)
Dr. Ezzard Standing (ENT) did the [place] and biopsy, so he would have the results

## 2011-11-06 NOTE — Telephone Encounter (Signed)
I spoke with pt and gave the below information. 

## 2011-11-06 NOTE — Telephone Encounter (Signed)
Pt would like ultrasound of neck results

## 2011-11-12 ENCOUNTER — Telehealth: Payer: Self-pay | Admitting: Family Medicine

## 2011-11-12 NOTE — Telephone Encounter (Signed)
Pt called and said that she req a new diabetic meter a few wks ago, but hasn't heard anything. Pt lost her old meter, and has been unable to check her blood sugar. Pt gets all her diabetic supplies through [facility].

## 2011-11-14 NOTE — Telephone Encounter (Signed)
I faxed script for machine, strips, & lancets to [facility] [phone] and I spoke with pt.

## 2011-12-03 ENCOUNTER — Ambulatory Visit (INDEPENDENT_AMBULATORY_CARE_PROVIDER_SITE_OTHER): Payer: Medicare PPO | Admitting: Family Medicine

## 2011-12-03 ENCOUNTER — Encounter: Payer: Medicare PPO | Admitting: Family

## 2011-12-03 ENCOUNTER — Encounter: Payer: Self-pay | Admitting: Family Medicine

## 2011-12-03 VITALS — BP 114/66 | HR 83 | Temp 98.3°F | Wt 177.0 lb

## 2011-12-03 DIAGNOSIS — R350 Frequency of micturition: Secondary | ICD-10-CM

## 2011-12-03 DIAGNOSIS — R1013 Epigastric pain: Secondary | ICD-10-CM

## 2011-12-03 LAB — POCT URINALYSIS DIPSTICK
Bilirubin, UA: NEGATIVE
Glucose, UA: NEGATIVE
Ketones, UA: NEGATIVE
Leukocytes, UA: NEGATIVE
Nitrite, UA: NEGATIVE
Protein, UA: NEGATIVE
Spec Grav, UA: 1.015
Urobilinogen, UA: 0.2
pH, UA: 5

## 2011-12-03 NOTE — Progress Notes (Signed)
  Subjective:    Patient ID: Wendy [name], female    DOB: 17-Aug-1945, 66 y.o.   MRN: [id number]  HPI Here for 2 weeks of nausea without vomiting, some indigestion, constipation, early satiety, and HAs. No fever or URI symptoms. She had normal upper and lower endoscopies in 2010. She also has some increased frequency of urination.    Review of Systems  Constitutional: Negative.   HENT: Negative.   Eyes: Negative.   Respiratory: Negative.   Cardiovascular: Negative.   Gastrointestinal: Positive for nausea and constipation. Negative for vomiting, abdominal pain, blood in stool and abdominal distention.  Genitourinary: Positive for urgency and frequency. Negative for dysuria, hematuria and difficulty urinating.       Objective:   Physical Exam  Constitutional: She appears well-developed and well-nourished.  Cardiovascular: Normal rate, regular rhythm, normal heart sounds and intact distal pulses.   Pulmonary/Chest: Effort normal and breath sounds normal.  Abdominal: Soft. Bowel sounds are normal. She exhibits no distension and no mass. There is no rebound and no guarding.       Mildly tender in the epigastrium           Assessment & Plan:  This is consistent with GERD. We will have her increase the Omeprazole to 40 mg bid. She will return in a few weeks for a cpx, and we can follow up then

## 2011-12-24 ENCOUNTER — Encounter: Payer: Self-pay | Admitting: Family Medicine

## 2011-12-24 ENCOUNTER — Ambulatory Visit (INDEPENDENT_AMBULATORY_CARE_PROVIDER_SITE_OTHER): Payer: Medicare PPO | Admitting: Family Medicine

## 2011-12-24 VITALS — BP 116/78 | HR 84 | Temp 98.3°F | Wt 175.0 lb

## 2011-12-24 DIAGNOSIS — M545 Low back pain, unspecified: Secondary | ICD-10-CM

## 2011-12-24 DIAGNOSIS — M6740 Ganglion, unspecified site: Secondary | ICD-10-CM

## 2011-12-24 DIAGNOSIS — D179 Benign lipomatous neoplasm, unspecified: Secondary | ICD-10-CM

## 2011-12-24 DIAGNOSIS — E119 Type 2 diabetes mellitus without complications: Secondary | ICD-10-CM

## 2011-12-24 LAB — HEMOGLOBIN A1C: Hgb A1c MFr Bld: 7 % — ABNORMAL HIGH (ref 4.6–6.5)

## 2011-12-24 MED ORDER — HYDROCODONE-ACETAMINOPHEN 10-325 MG PO TABS: 1.0000 | ORAL_TABLET | Freq: Four times a day (QID) | ORAL | Status: DC | PRN

## 2011-12-24 NOTE — Progress Notes (Signed)
  Subjective:    Patient ID: Wendy [name], female    DOB: 16-Jul-1945, 66 y.o.   MRN: [id number]  HPI Here for several issues. We saw her a few weeks ago for upper abdominal pains, and we felt this was due to GERD. We increased her Omeprazole to bid, and this has worked quite well. The pain is gone. She also asks to look at a non-tender lump on the left upper arm which came up several months ago. She has had tender knot on the left 3rd finger for a few months. She needs her pain meds refilled.    Review of Systems  Constitutional: Negative.   Respiratory: Negative.   Cardiovascular: Negative.        Objective:   Physical Exam  Constitutional: She appears well-developed and well-nourished.  Musculoskeletal:       Tender small cyst on the extensor surface of the left 3rd DIP. Non-tender firm mobile lump on the left upper arm.           Assessment & Plan:  Her GERD is now controlled. She has a benign lipoma and a benign ganglion cyst. These can be removed if she wishes.

## 2011-12-31 NOTE — Progress Notes (Signed)
Quick Note:  I spoke with pt ______ 

## 2012-01-13 ENCOUNTER — Telehealth: Payer: Self-pay | Admitting: Family Medicine

## 2012-01-13 MED ORDER — HYDROXYCHLOROQUINE SULFATE 200 MG PO TABS: 200.0000 mg | ORAL_TABLET | Freq: Every day | ORAL | Status: DC

## 2012-01-13 NOTE — Telephone Encounter (Signed)
Refill request for Hydroxychloroquine 200 mg and send to [facility] ( 90 day supply ). I did send script e-scribe.

## 2012-02-09 ENCOUNTER — Ambulatory Visit (INDEPENDENT_AMBULATORY_CARE_PROVIDER_SITE_OTHER): Payer: Medicare PPO | Admitting: Family Medicine

## 2012-02-09 ENCOUNTER — Encounter: Payer: Self-pay | Admitting: Family Medicine

## 2012-02-09 VITALS — BP 138/84 | HR 89 | Temp 98.2°F | Wt 182.0 lb

## 2012-02-09 DIAGNOSIS — S6000XA Contusion of unspecified finger without damage to nail, initial encounter: Secondary | ICD-10-CM

## 2012-02-09 MED ORDER — CEPHALEXIN 500 MG PO CAPS: 500.0000 mg | ORAL_CAPSULE | Freq: Three times a day (TID) | ORAL | Status: AC

## 2012-02-09 NOTE — Progress Notes (Signed)
  Subjective:    Patient ID: Wendy [name], female    DOB: 07/11/1945, 66 y.o.   MRN: [id number]  HPI Here for 5 days of pain at the tip of her left 5th finger after she shut the finger in a kitchen drawer. The tip of the fingernail was broken off. She has been cleaning it with peroxide daily. She is worried about infection setting in since she is diabetic.    Review of Systems  Constitutional: Negative.   Skin: Positive for wound.       Objective:   Physical Exam  Constitutional: She appears well-developed and well-nourished.  Musculoskeletal:       The tip of the left 5th finger is swollen and tender with a hard eschar in place. The distal nail has been broken off. The interphalangeal joints are intact.           Assessment & Plan:  Advised her to soak the finger in warm water with Epsom salts twice a day to soften up the eschar and get it to fall off. Then dress with neosporin and a Bandaid. It does not appear to be infected but we will cover with Keflex.

## 2012-03-17 ENCOUNTER — Other Ambulatory Visit: Payer: Self-pay | Admitting: Family Medicine

## 2012-03-18 ENCOUNTER — Telehealth: Payer: Self-pay | Admitting: Family Medicine

## 2012-03-18 DIAGNOSIS — Z9289 Personal history of other medical treatment: Secondary | ICD-10-CM

## 2012-03-18 NOTE — Telephone Encounter (Addendum)
Pt needs referral for a chest Xray. Pt needs TB XRAY screening for her job. 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 Pt's job is asking for her to get this done right away.  Pls advise.

## 2012-03-18 NOTE — Telephone Encounter (Signed)
I put in the order so she can go to [place] anytime for this

## 2012-03-18 NOTE — Telephone Encounter (Signed)
I left voice message with below information,

## 2012-04-09 ENCOUNTER — Encounter: Payer: Self-pay | Admitting: Family Medicine

## 2012-04-28 ENCOUNTER — Emergency Department (HOSPITAL_COMMUNITY): Payer: No Typology Code available for payment source

## 2012-04-28 ENCOUNTER — Emergency Department (HOSPITAL_COMMUNITY)
Admission: EM | Admit: 2012-04-28 | Discharge: 2012-04-28 | Disposition: A | Discharge status: Home / Self Care | Payer: No Typology Code available for payment source | Attending: Emergency Medicine | Admitting: Emergency Medicine

## 2012-04-28 ENCOUNTER — Encounter (HOSPITAL_COMMUNITY): Payer: Self-pay | Admitting: Family Medicine

## 2012-04-28 DIAGNOSIS — S0990XA Unspecified injury of head, initial encounter: Secondary | ICD-10-CM | POA: Insufficient documentation

## 2012-04-28 DIAGNOSIS — M545 Low back pain, unspecified: Secondary | ICD-10-CM | POA: Insufficient documentation

## 2012-04-28 DIAGNOSIS — S8990XA Unspecified injury of unspecified lower leg, initial encounter: Secondary | ICD-10-CM | POA: Insufficient documentation

## 2012-04-28 DIAGNOSIS — S0993XA Unspecified injury of face, initial encounter: Secondary | ICD-10-CM | POA: Insufficient documentation

## 2012-04-28 DIAGNOSIS — Z79899 Other long term (current) drug therapy: Secondary | ICD-10-CM | POA: Insufficient documentation

## 2012-04-28 DIAGNOSIS — K219 Gastro-esophageal reflux disease without esophagitis: Secondary | ICD-10-CM | POA: Insufficient documentation

## 2012-04-28 DIAGNOSIS — Z8739 Personal history of other diseases of the musculoskeletal system and connective tissue: Secondary | ICD-10-CM | POA: Insufficient documentation

## 2012-04-28 DIAGNOSIS — M25562 Pain in left knee: Secondary | ICD-10-CM

## 2012-04-28 DIAGNOSIS — Z8679 Personal history of other diseases of the circulatory system: Secondary | ICD-10-CM | POA: Insufficient documentation

## 2012-04-28 DIAGNOSIS — F17200 Nicotine dependence, unspecified, uncomplicated: Secondary | ICD-10-CM | POA: Insufficient documentation

## 2012-04-28 DIAGNOSIS — I10 Essential (primary) hypertension: Secondary | ICD-10-CM | POA: Insufficient documentation

## 2012-04-28 DIAGNOSIS — M549 Dorsalgia, unspecified: Secondary | ICD-10-CM

## 2012-04-28 DIAGNOSIS — S161XXA Strain of muscle, fascia and tendon at neck level, initial encounter: Secondary | ICD-10-CM

## 2012-04-28 DIAGNOSIS — E119 Type 2 diabetes mellitus without complications: Secondary | ICD-10-CM | POA: Insufficient documentation

## 2012-04-28 DIAGNOSIS — S139XXA Sprain of joints and ligaments of unspecified parts of neck, initial encounter: Secondary | ICD-10-CM | POA: Insufficient documentation

## 2012-04-28 DIAGNOSIS — Y9389 Activity, other specified: Secondary | ICD-10-CM | POA: Insufficient documentation

## 2012-04-28 DIAGNOSIS — B182 Chronic viral hepatitis C: Secondary | ICD-10-CM | POA: Insufficient documentation

## 2012-04-28 MED ORDER — OXYCODONE-ACETAMINOPHEN 5-325 MG PO TABS
2.0000 | ORAL_TABLET | Freq: Once | ORAL | Status: AC
  Administered 2012-04-28: 2 via ORAL
  Filled 2012-04-28: qty 2

## 2012-04-28 MED ORDER — HYDROCODONE-ACETAMINOPHEN 5-325 MG PO TABS: 1.0000 | ORAL_TABLET | Freq: Four times a day (QID) | ORAL | Status: DC | PRN

## 2012-04-28 MED ORDER — METHOCARBAMOL 750 MG PO TABS: 750.0000 mg | ORAL_TABLET | Freq: Four times a day (QID) | ORAL | Status: DC | PRN

## 2012-04-28 MED ORDER — DIAZEPAM 5 MG PO TABS
10.0000 mg | ORAL_TABLET | Freq: Once | ORAL | Status: AC
  Administered 2012-04-28: 10 mg via ORAL
  Filled 2012-04-28: qty 2

## 2012-04-28 MED ORDER — NAPROXEN 500 MG PO TABS: 500.0000 mg | ORAL_TABLET | Freq: Two times a day (BID) | ORAL | Status: DC | PRN

## 2012-04-28 NOTE — ED Notes (Signed)
EDPA at bedside for assessment.  Pt removed from LSB. C-collar remains applied and intact.
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
## 2012-04-28 NOTE — ED Provider Notes (Signed)
Medical screening examination/treatment/procedure(s) were performed by non-physician practitioner and as supervising physician I was immediately available for consultation/collaboration.   Richardean Canal, MD 04/28/12 2239

## 2012-04-28 NOTE — Progress Notes (Signed)
Orthopedic Tech Progress Note Patient Details:  LAURIEL HELIN [birth year] [id number]  Ortho Devices Type of Ortho Device: Knee Sleeve Ortho Device/Splint Location: left leg Ortho Device/Splint Interventions: Application   Crawford, Rembert 04/28/2012, 10:50 PM

## 2012-04-28 NOTE — ED Notes (Signed)
Ortho paged and returned call. 

## 2012-04-28 NOTE — ED Notes (Signed)
Family at bedside and updated on plan of care
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
## 2012-06-25 ENCOUNTER — Telehealth: Payer: Self-pay | Admitting: Family Medicine

## 2012-06-25 MED ORDER — HYDROCODONE-ACETAMINOPHEN 10-325 MG PO TABS: 1.0000 | ORAL_TABLET | Freq: Four times a day (QID) | ORAL | Status: DC | PRN

## 2012-06-25 NOTE — Telephone Encounter (Signed)
Rx called in to pharmacy. 

## 2012-06-25 NOTE — Telephone Encounter (Signed)
Request for Norco 10/325 take 1 po q 6hrs prn

## 2012-06-25 NOTE — Telephone Encounter (Signed)
Call in #[phone] with 2 rf 

## 2012-06-29 ENCOUNTER — Encounter: Payer: Self-pay | Admitting: Family Medicine

## 2012-06-29 ENCOUNTER — Ambulatory Visit (INDEPENDENT_AMBULATORY_CARE_PROVIDER_SITE_OTHER): Payer: Medicare PPO | Admitting: Family Medicine

## 2012-06-29 VITALS — BP 130/84 | HR 72 | Temp 98.6°F | Resp 16 | Ht 64.0 in | Wt 180.0 lb

## 2012-06-29 DIAGNOSIS — E1149 Type 2 diabetes mellitus with other diabetic neurological complication: Secondary | ICD-10-CM

## 2012-06-29 DIAGNOSIS — M545 Low back pain, unspecified: Secondary | ICD-10-CM

## 2012-06-29 DIAGNOSIS — E1142 Type 2 diabetes mellitus with diabetic polyneuropathy: Secondary | ICD-10-CM

## 2012-06-29 DIAGNOSIS — E119 Type 2 diabetes mellitus without complications: Secondary | ICD-10-CM

## 2012-06-29 DIAGNOSIS — E1140 Type 2 diabetes mellitus with diabetic neuropathy, unspecified: Secondary | ICD-10-CM

## 2012-06-29 LAB — HEMOGLOBIN A1C: Hgb A1c MFr Bld: 8 % — ABNORMAL HIGH (ref 4.6–6.5)

## 2012-06-29 MED ORDER — HYDROCODONE-ACETAMINOPHEN 10-325 MG PO TABS: 1.0000 | ORAL_TABLET | Freq: Four times a day (QID) | ORAL | Status: DC | PRN

## 2012-06-29 MED ORDER — GABAPENTIN 100 MG PO CAPS: 100.0000 mg | ORAL_CAPSULE | Freq: Three times a day (TID) | ORAL | Status: DC
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
## 2012-07-12 ENCOUNTER — Ambulatory Visit: Payer: Medicare PPO | Admitting: Physical Therapy

## 2012-11-18 ENCOUNTER — Other Ambulatory Visit: Payer: Self-pay | Admitting: Family Medicine

## 2012-11-19 NOTE — Telephone Encounter (Signed)
Can we refill these for a 90 day supply?

## 2012-11-22 ENCOUNTER — Telehealth: Payer: Self-pay | Admitting: Family Medicine

## 2012-11-22 DIAGNOSIS — M069 Rheumatoid arthritis, unspecified: Secondary | ICD-10-CM

## 2012-11-22 DIAGNOSIS — B1920 Unspecified viral hepatitis C without hepatic coma: Secondary | ICD-10-CM

## 2012-11-22 NOTE — Telephone Encounter (Signed)
Refill request for Prodigy Testing strips and send to [facility] fax # [phone]. ( 90 day supply )

## 2012-11-22 NOTE — Telephone Encounter (Signed)
Pt states that she would like a referral for GI, Cardiology, and Rhuemotology in [place]. She's currently seeing all 3 of these specialist in [place], but would like to see new doctors in [place]. Please assist.

## 2012-11-23 NOTE — Telephone Encounter (Signed)
Please refill her testing strips. I know she sees GI for hepatitis C and she sees Rheumatology for rheumatoid arthritis, but why does see Cardiology?

## 2012-11-23 NOTE — Telephone Encounter (Signed)
Refill request for Norco 10/325 mg and call to [facility].  Also pt needs referral for eye doctor not cardiology.  Please call pt when and if we get script called in to pharmacy.

## 2012-11-24 ENCOUNTER — Telehealth: Payer: Self-pay | Admitting: Family Medicine

## 2012-11-24 MED ORDER — GLUCOSE BLOOD VI STRP: ORAL_STRIP | Status: DC

## 2012-11-24 MED ORDER — HYDROCODONE-ACETAMINOPHEN 10-325 MG PO TABS: 1.0000 | ORAL_TABLET | Freq: Four times a day (QID) | ORAL | Status: DC | PRN

## 2012-11-24 NOTE — Telephone Encounter (Signed)
I called in script and spoke with pt. Dr. [name] did put in all 3 referrals.

## 2012-11-24 NOTE — Telephone Encounter (Signed)
Call in Norco #[phone] with 5 rf

## 2012-11-24 NOTE — Telephone Encounter (Signed)
Refill request for Prodigy Testing strips and send to [facility] 90 day supply and I did send script e-scribe.

## 2012-11-26 ENCOUNTER — Ambulatory Visit: Payer: Medicare PPO

## 2012-11-30 ENCOUNTER — Encounter: Payer: Self-pay | Admitting: Family Medicine

## 2012-12-09 ENCOUNTER — Telehealth: Payer: Self-pay | Admitting: Family Medicine

## 2012-12-09 NOTE — Telephone Encounter (Signed)
Refill request for Alprazolam 0.5 mg take 1 po qhs prn and send to [facility].

## 2012-12-10 ENCOUNTER — Other Ambulatory Visit: Payer: Self-pay | Admitting: Family Medicine

## 2012-12-10 NOTE — Telephone Encounter (Signed)
Call in #30 with 5 rf 

## 2012-12-13 MED ORDER — ALPRAZOLAM 0.5 MG PO TABS: 0.5000 mg | ORAL_TABLET | Freq: Every evening | ORAL | Status: DC | PRN

## 2012-12-13 NOTE — Telephone Encounter (Signed)
Rx called in to pharmacy. 

## 2012-12-14 ENCOUNTER — Ambulatory Visit: Payer: Medicare PPO | Admitting: Family Medicine

## 2012-12-21 ENCOUNTER — Ambulatory Visit (INDEPENDENT_AMBULATORY_CARE_PROVIDER_SITE_OTHER): Payer: Medicare PPO | Admitting: Family Medicine

## 2012-12-21 ENCOUNTER — Encounter: Payer: Self-pay | Admitting: Family Medicine

## 2012-12-21 VITALS — BP 130/84 | Temp 98.5°F | Wt 180.0 lb

## 2012-12-21 DIAGNOSIS — Z9109 Other allergy status, other than to drugs and biological substances: Secondary | ICD-10-CM

## 2012-12-21 DIAGNOSIS — Z23 Encounter for immunization: Secondary | ICD-10-CM

## 2012-12-21 MED ORDER — HYDROCODONE-ACETAMINOPHEN 10-325 MG PO TABS: 1.0000 | ORAL_TABLET | Freq: Four times a day (QID) | ORAL | Status: DC | PRN

## 2012-12-21 MED ORDER — HYDROCODONE-HOMATROPINE 5-1.5 MG/5ML PO SYRP: 5.0000 mL | ORAL_SOLUTION | ORAL | Status: DC | PRN

## 2012-12-21 NOTE — Progress Notes (Signed)
  Subjective:    Patient ID: Wendy [name], female    DOB: 11-Feb-1946, 67 y.o.   MRN: [id number]  HPI Here for 2 weeks of a dry cough and PND. No sinus pressure or HA or fever. She usually has allergy trouble in the spring and fall.    Review of Systems  Constitutional: Negative.   HENT: Positive for postnasal drip.   Eyes: Negative.   Respiratory: Positive for cough.        Objective:   Physical Exam  Constitutional: She appears well-developed and well-nourished.  HENT:  Right Ear: External ear normal.  Left Ear: External ear normal.  Nose: Nose normal.  Mouth/Throat: Oropharynx is clear and moist.  Eyes: Conjunctivae are normal.  Neck: No thyromegaly present.  Pulmonary/Chest: Effort normal and breath sounds normal.  Lymphadenopathy:    She has no cervical adenopathy.          Assessment & Plan:  Try Claritin daily

## 2012-12-21 NOTE — Addendum Note (Signed)
Addended by: Aniceto Boss A on: 12/21/2012 10:39 AM   Modules accepted: Orders

## 2013-01-07 ENCOUNTER — Telehealth: Payer: Self-pay | Admitting: Family Medicine

## 2013-01-07 NOTE — Telephone Encounter (Signed)
Order for diabetic shoes faxed 10/31. Following up on fax request. Will refax.

## 2013-01-19 ENCOUNTER — Other Ambulatory Visit: Payer: Self-pay | Admitting: Internal Medicine

## 2013-01-19 DIAGNOSIS — B1920 Unspecified viral hepatitis C without hepatic coma: Secondary | ICD-10-CM

## 2013-01-26 ENCOUNTER — Ambulatory Visit: Payer: Medicare PPO

## 2013-02-03 ENCOUNTER — Other Ambulatory Visit: Payer: Self-pay | Admitting: Internal Medicine

## 2013-02-03 ENCOUNTER — Ambulatory Visit
Admission: RE | Admit: 2013-02-03 | Discharge: 2013-02-03 | Disposition: A | Discharge status: Home / Self Care | Payer: Commercial Managed Care - HMO | Source: Ambulatory Visit | Attending: Internal Medicine | Admitting: Internal Medicine

## 2013-02-03 DIAGNOSIS — B1920 Unspecified viral hepatitis C without hepatic coma: Secondary | ICD-10-CM

## 2013-03-04 ENCOUNTER — Encounter: Payer: Self-pay | Admitting: Internal Medicine

## 2013-03-04 ENCOUNTER — Ambulatory Visit (INDEPENDENT_AMBULATORY_CARE_PROVIDER_SITE_OTHER): Payer: Medicare PPO | Admitting: Internal Medicine

## 2013-03-04 VITALS — BP 110/78 | HR 84 | Temp 97.2°F | Wt 180.8 lb

## 2013-03-04 DIAGNOSIS — J029 Acute pharyngitis, unspecified: Secondary | ICD-10-CM

## 2013-03-04 MED ORDER — PROMETHAZINE-CODEINE 6.25-10 MG/5ML PO SYRP: 5.0000 mL | ORAL_SOLUTION | ORAL | Status: DC | PRN

## 2013-03-04 MED ORDER — AMOXICILLIN 875 MG PO TABS: 875.0000 mg | ORAL_TABLET | Freq: Two times a day (BID) | ORAL | Status: DC

## 2013-03-04 NOTE — Progress Notes (Signed)
Pre visit review using our clinic review tool, if applicable. No additional management support is needed unless otherwise documented below in the visit note. 

## 2013-03-04 NOTE — Patient Instructions (Signed)
Pharyngitis - mild. Does not look like strep. Right TM is rosy  Plan Amoxicillin twice a day for 7 days  Phenergan with codeine cough syrup  Tylenol for fever/chills  Hydrate  Continue all medications.     Sore Throat A sore throat is pain, burning, irritation, or scratchiness of the throat. There is often pain or tenderness when swallowing or talking. A sore throat may be accompanied by other symptoms, such as coughing, sneezing, fever, and swollen neck glands. A sore throat is often the first sign of another sickness, such as a cold, flu, strep throat, or mononucleosis (commonly known as mono). Most sore throats go away without medical treatment. CAUSES  The most common causes of a sore throat include:  A viral infection, such as a cold, flu, or mono.  A bacterial infection, such as strep throat, tonsillitis, or whooping cough.  Seasonal allergies.  Dryness in the air.  Irritants, such as smoke or pollution.  Gastroesophageal reflux disease (GERD). HOME CARE INSTRUCTIONS   Only take over-the-counter medicines as directed by your caregiver.  Drink enough fluids to keep your urine clear or pale yellow.  Rest as needed.  Try using throat sprays, lozenges, or sucking on hard candy to ease any pain (if older than 4 years or as directed).  Sip warm liquids, such as broth, herbal tea, or warm water with honey to relieve pain temporarily. You may also eat or drink cold or frozen liquids such as frozen ice pops.  Gargle with salt water (mix 1 tsp salt with 8 oz of water).  Do not smoke and avoid secondhand smoke.  Put a cool-mist humidifier in your bedroom at night to moisten the air. You can also turn on a hot shower and sit in the bathroom with the door closed for 5 10 minutes. SEEK IMMEDIATE MEDICAL CARE IF:  You have difficulty breathing.  You are unable to swallow fluids, soft foods, or your saliva.  You have increased swelling in the throat.  Your sore throat does  not get better in 7 days.  You have nausea and vomiting.  You have a fever or persistent symptoms for more than 2 3 days.  You have a fever and your symptoms suddenly get worse. MAKE SURE YOU:   Understand these instructions.  Will watch your condition.  Will get help right away if you are not doing well or get worse. Document Released: 03/27/2004 Document Revised: 02/04/2012 Document Reviewed: 10/26/2011 [facility] Patient Information 2014 [place], [place].

## 2013-03-04 NOTE — Progress Notes (Signed)
Subjective:    Patient ID: Wendy [name], female    DOB: August 16, 1945, 68 y.o.   MRN: [id number]  HPI Mrs. Mackowiak presents for right ear pain, cough and sore throat for several days. No fever, no rigors, it hurts to swallow. She has had ringing in her ears. She has been taking hydrocodone syrup but it made her sick. She has been taking diabetic tussin w/o relief. She has been coughing and gagging leading to throwing up.   Past Medical History  Diagnosis Date  . HEPATITIS C 02/08/2007    sees GI @ [place], TYPE II 11/05/2006  . HYPERTENSION 11/05/2006  . GERD 11/20/2006  . LOW BACK PAIN SYNDROME 07/21/2007  . FOOT PAIN 05/23/2009  . DEPENDENT EDEMA 07/21/2007  . 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 Rheumatoid arthritis(714.0) 11/05/2006    sees Dr. Myrtie Neither at [facility]    Past Surgical History  Procedure Laterality Date  . Lumbar laminectomy      1995  . Esophagogastroduodenoscopy  06-20-08    at [facility], clear   . Colonoscopy  06-20-08    at [facility], clear    Family History  Problem Relation Age of Onset  . Arthritis    . Diabetes    . Hypertension    . Cancer      lung   History   Social History  . Marital Status: Legally Separated    Spouse Name: N/A    Number of Children: N/A  . Years of Education: N/A   Occupational History  . Not on file.   Social History Main Topics  . Smoking status: Current Every Day Smoker    Types: Cigarettes  . Smokeless tobacco: Never Used     Comment: 3 cigarettes per day  . Alcohol Use: No  . Drug Use: No  . Sexual Activity: Not on file   Other Topics Concern  . Not on file   Social History Narrative  . No narrative on file    Current Outpatient Prescriptions on File Prior to Visit  Medication Sig Dispense Refill  . ALPRAZolam (XANAX) 0.5 MG tablet Take 1 tablet (0.5 mg total) by mouth at bedtime as needed for sleep.  30 tablet  5  . furosemide (LASIX) 20 MG tablet Take 20 mg by  mouth daily as needed (fluid retention).      . gabapentin (NEURONTIN) 100 MG capsule Take 1 capsule (100 mg total) by mouth 3 (three) times daily.  90 capsule  5  . glipiZIDE (GLUCOTROL) 10 MG tablet Take 10 mg by mouth 2 (two) times daily before a meal.      . glucose blood test strip Use as instructed  100 each  3  . HYDROcodone-acetaminophen (NORCO) 10-325 MG per tablet Take 1 tablet by mouth every 6 (six) hours as needed for pain.  120 tablet  0  . HYDROcodone-homatropine (HYDROMET) 5-1.5 MG/5ML syrup Take 5 mLs by mouth every 4 (four) hours as needed for cough.  240 mL  0  . hydroxychloroquine (PLAQUENIL) 200 MG tablet Take 1 tablet (200 mg total) by mouth daily.  90 tablet  3  . lisinopril-hydrochlorothiazide (PRINZIDE,ZESTORETIC) 20-25 MG per tablet TAKE 1 TABLET EVERY DAY  90 tablet  1  . metFORMIN (GLUCOPHAGE) 1000 MG tablet Take 1,000 mg by mouth daily with breakfast.      . methocarbamol (ROBAXIN) 750 MG tablet Take 1 tablet (750 mg total) by mouth 4 (  four) times daily as needed (Take 1 tablet every 6 hours as needed for muscle spasms.).  20 tablet  0  . naproxen (NAPROSYN) 500 MG tablet Take 1 tablet (500 mg total) by mouth 2 (two) times daily as needed.  30 tablet  0  . omeprazole (PRILOSEC) 40 MG capsule TAKE 1 CAPSULE EVERY DAY  90 capsule  1  . sitaGLIPtin (JANUVIA) 100 MG tablet Take 100 mg by mouth daily.       No current facility-administered medications on file prior to visit.      Review of Systems System review is negative for any constitutional, cardiac, pulmonary, GI or neuro symptoms or complaints other than as described in the HPI.     Objective:   Physical Exam Filed Vitals:   03/04/13 1205  BP: 110/78  Pulse: 84  Temp: 97.2 F (36.2 C)   BP Readings from Last 3 Encounters:  03/04/13 110/78  12/21/12 130/84  06/29/12 130/84   HEENT- Right TM rosy, posterior pharynx with erythema w/o exudate Nodes - negative Cor- RRR Pulm - CTAP       Assessment  & Plan:  Pharyngitis - mild. Does not look like strep. Right TM is rosy  Plan Amoxicillin twice a day for 7 days  Phenergan with codeine cough syrup  Tylenol for fever/chills  Hydrate  Continue all medications.

## 2013-03-15 ENCOUNTER — Telehealth: Payer: Self-pay | Admitting: Family Medicine

## 2013-03-15 MED ORDER — HYDROCODONE-ACETAMINOPHEN 10-325 MG PO TABS: 1.0000 | ORAL_TABLET | Freq: Four times a day (QID) | ORAL | Status: DC | PRN

## 2013-03-15 NOTE — Telephone Encounter (Signed)
Done for one month only. She needs testing and a contract  

## 2013-03-15 NOTE — Telephone Encounter (Signed)
Pt needs new rx for hydrocodone. Pt is requesting rxs for next 3 month

## 2013-03-16 NOTE — Telephone Encounter (Signed)
Script is ready for pick up, contract printed and I left a voice message for pt.

## 2013-04-01 ENCOUNTER — Ambulatory Visit (INDEPENDENT_AMBULATORY_CARE_PROVIDER_SITE_OTHER): Payer: Medicare PPO | Admitting: Family Medicine

## 2013-04-01 ENCOUNTER — Encounter: Payer: Self-pay | Admitting: Family Medicine

## 2013-04-01 VITALS — BP 110/74 | Temp 97.7°F | Ht 64.0 in | Wt 181.0 lb

## 2013-04-01 DIAGNOSIS — M545 Low back pain, unspecified: Secondary | ICD-10-CM

## 2013-04-01 DIAGNOSIS — J019 Acute sinusitis, unspecified: Secondary | ICD-10-CM

## 2013-04-01 DIAGNOSIS — M069 Rheumatoid arthritis, unspecified: Secondary | ICD-10-CM

## 2013-04-01 MED ORDER — CYCLOBENZAPRINE HCL 10 MG PO TABS: 10.0000 mg | ORAL_TABLET | Freq: Three times a day (TID) | ORAL | Status: DC | PRN

## 2013-04-01 MED ORDER — CEFUROXIME AXETIL 500 MG PO TABS: 500.0000 mg | ORAL_TABLET | Freq: Two times a day (BID) | ORAL | Status: DC

## 2013-04-01 NOTE — Progress Notes (Signed)
   Subjective:    Patient ID: Wendy [name], female    DOB: 1946-01-26, 68 y.o.   MRN: [id number]  HPI Here for several things. She asks for a muscle relaxer to take. She needs a referral to keep seeing her rhuematologist. She is still having some ST and PND form the sinuses. No fever or cough. She took Amoxicillin a few weeks ago and felt better for awhile.    Review of Systems  Constitutional: Negative.   HENT: Positive for congestion, postnasal drip and sinus pressure.   Eyes: Negative.   Respiratory: Negative.        Objective:   Physical Exam  Constitutional: She appears well-developed and well-nourished.  HENT:  Right Ear: External ear normal.  Left Ear: External ear normal.  Nose: Nose normal.  Mouth/Throat: Oropharynx is clear and moist.  Eyes: Conjunctivae are normal.  Neck: No thyromegaly present.  Pulmonary/Chest: Effort normal and breath sounds normal.  Lymphadenopathy:    She has no cervical adenopathy.          Assessment & Plan:  Given Ceftin. We will do the referral. Try some Flexeril.

## 2013-04-01 NOTE — Progress Notes (Signed)
Pre visit review using our clinic review tool, if applicable. No additional management support is needed unless otherwise documented below in the visit note. 

## 2013-04-04 ENCOUNTER — Telehealth: Payer: Self-pay | Admitting: Family Medicine

## 2013-04-04 MED ORDER — PROMETHAZINE HCL 25 MG PO TABS: 25.0000 mg | ORAL_TABLET | ORAL | Status: DC | PRN

## 2013-04-04 NOTE — Telephone Encounter (Addendum)
Pt was seen on Friday and is still waiting on nausea med to be call into [address]

## 2013-04-04 NOTE — Telephone Encounter (Signed)
I sent script e-scribe and spoke with pt. 

## 2013-04-04 NOTE — Telephone Encounter (Signed)
Call in Phenergan 25 mg tabs to take q 4 hours prn nausea, #60 with 2 rf

## 2013-04-05 ENCOUNTER — Other Ambulatory Visit: Payer: Self-pay | Admitting: Family Medicine

## 2013-04-05 NOTE — Telephone Encounter (Signed)
she was seen today.

## 2013-04-05 NOTE — Telephone Encounter (Addendum)
Pt states her insurance will not pay for this med and pt needs alternate. cyclobenzaprine (FLEXERIL) 10 MG tablet needs prior auth Pharm states they sent info and will resend.

## 2013-04-06 ENCOUNTER — Telehealth: Payer: Self-pay | Admitting: Family Medicine

## 2013-04-06 NOTE — Telephone Encounter (Signed)
Relevant patient education mailed to patient.  

## 2013-04-14 ENCOUNTER — Encounter: Payer: Self-pay | Admitting: Family Medicine

## 2013-04-18 ENCOUNTER — Encounter: Payer: Self-pay | Admitting: Family Medicine

## 2013-04-22 ENCOUNTER — Telehealth: Payer: Self-pay | Admitting: Family Medicine

## 2013-04-22 ENCOUNTER — Encounter: Payer: Self-pay | Admitting: Family Medicine

## 2013-04-22 MED ORDER — HYDROCODONE-ACETAMINOPHEN 10-325 MG PO TABS: 1.0000 | ORAL_TABLET | Freq: Four times a day (QID) | ORAL | Status: DC | PRN

## 2013-04-22 NOTE — Telephone Encounter (Signed)
Pt needs re-fill HYDROcodone-acetaminophen (NORCO) 10-325 MG per tablet

## 2013-04-22 NOTE — Telephone Encounter (Signed)
Done for 3 months

## 2013-04-22 NOTE — Telephone Encounter (Signed)
Script is ready for pick up and I spoke with pt.  

## 2013-05-11 ENCOUNTER — Telehealth: Payer: Self-pay | Admitting: Family Medicine

## 2013-05-19 ENCOUNTER — Other Ambulatory Visit: Payer: Self-pay | Admitting: Nurse Practitioner

## 2013-05-19 DIAGNOSIS — C220 Liver cell carcinoma: Secondary | ICD-10-CM

## 2013-05-26 ENCOUNTER — Ambulatory Visit
Admission: RE | Admit: 2013-05-26 | Discharge: 2013-05-26 | Disposition: A | Discharge status: Home / Self Care | Payer: Commercial Managed Care - HMO | Source: Ambulatory Visit | Attending: Nurse Practitioner | Admitting: Nurse Practitioner

## 2013-05-26 DIAGNOSIS — C220 Liver cell carcinoma: Secondary | ICD-10-CM

## 2013-06-08 ENCOUNTER — Telehealth: Payer: Self-pay | Admitting: Family Medicine

## 2013-06-08 NOTE — Telephone Encounter (Signed)
Pharm called to confirm pt's allergies and needs a verbal ok to fill scar cream and a topical pain cream. pls call . Note ext and reference number.

## 2013-06-08 NOTE — Telephone Encounter (Signed)
I spoke with Megan from pharmacy and they did fax over this request for the below medications. I also spoke with pt and explained that we normally do not prescribe compounding creams or gels, not sure if insurance covers this type of therapy. Dr. Sarajane Jews is out of the office and will be returning next week and he will take a look at the faxes then.

## 2013-06-10 ENCOUNTER — Ambulatory Visit (INDEPENDENT_AMBULATORY_CARE_PROVIDER_SITE_OTHER): Payer: Commercial Managed Care - HMO | Admitting: Family Medicine

## 2013-06-10 ENCOUNTER — Encounter: Payer: Self-pay | Admitting: Family Medicine

## 2013-06-10 VITALS — BP 130/80 | Temp 98.2°F | Wt 183.0 lb

## 2013-06-10 DIAGNOSIS — R059 Cough, unspecified: Secondary | ICD-10-CM

## 2013-06-10 DIAGNOSIS — J309 Allergic rhinitis, unspecified: Secondary | ICD-10-CM

## 2013-06-10 DIAGNOSIS — R05 Cough: Secondary | ICD-10-CM

## 2013-06-10 MED ORDER — BENZONATATE 100 MG PO CAPS: 100.0000 mg | ORAL_CAPSULE | Freq: Two times a day (BID) | ORAL | Status: DC | PRN

## 2013-06-10 NOTE — Patient Instructions (Signed)
-  Afrin for 4-5 days then stop  -flonase daily  -claritin daily  -cough medication as needed

## 2013-06-10 NOTE — Progress Notes (Signed)
Chief Complaint  Patient presents with  . left side pain    soreness     HPI:  Acute visit for:  Cough and cold or allergies: -started 1 week ago -symptoms: nasal congestion, PND, sneezing, eyes watery, cough -coughing so hard that cause some L side/back pain -cough is now better -denies: fever, SOB, wheezing,vomiting, diarrhea, dysuria -always has constipation - last BM yesterday ok -has flonase but not taking, takes Claritin occasionally  ROS: See pertinent positives and negatives per HPI.  Past Medical History  Diagnosis Date  . HEPATITIS C 02/08/2007    sees GI @ [place], TYPE II 11/05/2006  . HYPERTENSION 11/05/2006  . GERD 11/20/2006  . LOW BACK PAIN SYNDROME 07/21/2007  . FOOT PAIN 05/23/2009  . DEPENDENT EDEMA 07/21/2007  . 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 Rheumatoid arthritis(714.0) 11/05/2006    sees Dr. Gavin Pound     Past Surgical History  Procedure Laterality Date  . Lumbar laminectomy      1995  . Esophagogastroduodenoscopy  06-20-08    at [facility], clear   . Colonoscopy  06-20-08    at [facility], clear     Family History  Problem Relation Age of Onset  . Arthritis    . Diabetes    . Hypertension    . Cancer      lung    History   Social History  . Marital Status: Legally Separated    Spouse Name: N/A    Number of Children: N/A  . Years of Education: N/A   Social History Main Topics  . Smoking status: Current Some Day Smoker    Types: Cigarettes  . Smokeless tobacco: Never Used     Comment: last time was about 1 month ago  . Alcohol Use: No  . Drug Use: No  . Sexual Activity: None   Other Topics Concern  . None   Social History Narrative  . None    Current outpatient prescriptions:ALPRAZolam (XANAX) 0.5 MG tablet, Take 1 tablet (0.5 mg total) by mouth at bedtime as needed for sleep., Disp: 30 tablet, Rfl: 5;  cyclobenzaprine (FLEXERIL) 10 MG tablet, Take 1 tablet (10 mg total) by mouth 3  (three) times daily as needed for muscle spasms., Disp: 60 tablet, Rfl: 5;  furosemide (LASIX) 20 MG tablet, Take 20 mg by mouth daily as needed (fluid retention)., Disp: , Rfl:  gabapentin (NEURONTIN) 100 MG capsule, Take 1 capsule (100 mg total) by mouth 3 (three) times daily., Disp: 90 capsule, Rfl: 5;  glipiZIDE (GLUCOTROL) 10 MG tablet, Take 10 mg by mouth 2 (two) times daily before a meal., Disp: , Rfl: ;  glucose blood test strip, Use as instructed, Disp: 100 each, Rfl: 3 HYDROcodone-acetaminophen (NORCO) 10-325 MG per tablet, Take 1 tablet by mouth every 6 (six) hours as needed for moderate pain., Disp: 120 tablet, Rfl: 0;  hydroxychloroquine (PLAQUENIL) 200 MG tablet, Take 1 tablet (200 mg total) by mouth daily., Disp: 90 tablet, Rfl: 3;  lisinopril-hydrochlorothiazide (PRINZIDE,ZESTORETIC) 20-25 MG per tablet, TAKE 1 TABLET EVERY DAY, Disp: 90 tablet, Rfl: 1 metFORMIN (GLUCOPHAGE) 1000 MG tablet, Take 1,000 mg by mouth daily with breakfast., Disp: , Rfl: ;  methocarbamol (ROBAXIN) 750 MG tablet, Take 1 tablet (750 mg total) by mouth 4 (four) times daily as needed (Take 1 tablet every 6 hours as needed for muscle spasms.)., Disp: 20 tablet, Rfl: 0;  naproxen (NAPROSYN) 500 MG tablet, Take 1  tablet (500 mg total) by mouth 2 (two) times daily as needed., Disp: 30 tablet, Rfl: 0 omeprazole (PRILOSEC) 40 MG capsule, TAKE 1 CAPSULE EVERY DAY, Disp: 90 capsule, Rfl: 1;  promethazine (PHENERGAN) 25 MG tablet, Take 1 tablet (25 mg total) by mouth every 4 (four) hours as needed for nausea or vomiting., Disp: 60 tablet, Rfl: 2;  promethazine-codeine (PHENERGAN WITH CODEINE) 6.25-10 MG/5ML syrup, Take 5 mLs by mouth every 4 (four) hours as needed for cough., Disp: 120 mL, Rfl: 0 benzonatate (TESSALON) 100 MG capsule, Take 1 capsule (100 mg total) by mouth 2 (two) times daily as needed for cough., Disp: 20 capsule, Rfl: 0;  cefUROXime (CEFTIN) 500 MG tablet, Take 1 tablet (500 mg total) by mouth 2 (two) times  daily with a meal., Disp: 20 tablet, Rfl: 0;  HYDROcodone-homatropine (HYDROMET) 5-1.5 MG/5ML syrup, Take 5 mLs by mouth every 4 (four) hours as needed for cough., Disp: 240 mL, Rfl: 0  EXAM:  Filed Vitals:   06/10/13 1412  BP: 130/80  Temp: 98.2 F (36.8 C)    Body mass index is 31.4 kg/(m^2).  GENERAL: vitals reviewed and listed above, alert, oriented, appears well hydrated and in no acute distress  HEENT: atraumatic, conjunttiva clear, no obvious abnormalities on inspection of external nose and ears, normal appearance of ear canals and TMs, copious amounts of clear nasal congestion, boggy enlarged turbinates, mild post oropharyngeal erythema with PND and cobblestoning, no tonsillar edema or exudate, no sinus TTP  NECK: no obvious masses on inspection  LUNGS: clear to auscultation bilaterally, no wheezes, rales or rhonchi, good air movement  CV: HRRR, no peripheral edema  MS: moves all extremities without noticeable abnormality -no rib TTP, dose have some muscle TTP in L mid/lat back  PSYCH: pleasant and cooperative, no obvious depression or anxiety  ASSESSMENT AND PLAN:  Discussed the following assessment and plan:  Allergic rhinitis - Plan: benzonatate (TESSALON) 100 MG capsule  Cough - Plan: benzonatate (TESSALON) 100 MG capsule  --we discussed possible serious and likely etiologies, workup and treatment, treatment risks and return precautions -after this discussion, Lonni opted for tx of her allergies with antihistamine, daily INS, short course of nasal decongestant - warned to stop in 4-5 days and of rebound congestion and cough medication -of course, we advised Shareta  to return or notify a doctor immediately if symptoms worsen or persist or new concerns arise.   -Patient advised to return or notify a doctor immediately if symptoms worsen or persist or new concerns arise.  Patient Instructions  -Afrin for 4-5 days then stop  -flonase daily  -claritin  daily  -cough medication as needed     Lucretia Kern

## 2013-06-10 NOTE — Progress Notes (Signed)
Pre visit review using our clinic review tool, if applicable. No additional management support is needed unless otherwise documented below in the visit note. 

## 2013-06-27 ENCOUNTER — Emergency Department (HOSPITAL_COMMUNITY): Payer: Medicare HMO

## 2013-06-27 ENCOUNTER — Emergency Department (HOSPITAL_COMMUNITY)
Admission: EM | Admit: 2013-06-27 | Discharge: 2013-06-27 | Disposition: A | Discharge status: Home / Self Care | Payer: Medicare HMO | Attending: Emergency Medicine | Admitting: Emergency Medicine

## 2013-06-27 ENCOUNTER — Encounter (HOSPITAL_COMMUNITY): Payer: Self-pay | Admitting: Emergency Medicine

## 2013-06-27 DIAGNOSIS — R609 Edema, unspecified: Secondary | ICD-10-CM | POA: Insufficient documentation

## 2013-06-27 DIAGNOSIS — R5381 Other malaise: Secondary | ICD-10-CM | POA: Insufficient documentation

## 2013-06-27 DIAGNOSIS — M069 Rheumatoid arthritis, unspecified: Secondary | ICD-10-CM | POA: Insufficient documentation

## 2013-06-27 DIAGNOSIS — R7611 Nonspecific reaction to tuberculin skin test without active tuberculosis: Secondary | ICD-10-CM | POA: Insufficient documentation

## 2013-06-27 DIAGNOSIS — R062 Wheezing: Secondary | ICD-10-CM | POA: Insufficient documentation

## 2013-06-27 DIAGNOSIS — IMO0001 Reserved for inherently not codable concepts without codable children: Secondary | ICD-10-CM | POA: Insufficient documentation

## 2013-06-27 DIAGNOSIS — R6883 Chills (without fever): Secondary | ICD-10-CM | POA: Insufficient documentation

## 2013-06-27 DIAGNOSIS — R197 Diarrhea, unspecified: Secondary | ICD-10-CM | POA: Insufficient documentation

## 2013-06-27 DIAGNOSIS — R059 Cough, unspecified: Secondary | ICD-10-CM | POA: Insufficient documentation

## 2013-06-27 DIAGNOSIS — J029 Acute pharyngitis, unspecified: Secondary | ICD-10-CM | POA: Insufficient documentation

## 2013-06-27 DIAGNOSIS — R61 Generalized hyperhidrosis: Secondary | ICD-10-CM | POA: Insufficient documentation

## 2013-06-27 DIAGNOSIS — R51 Headache: Secondary | ICD-10-CM | POA: Insufficient documentation

## 2013-06-27 DIAGNOSIS — E119 Type 2 diabetes mellitus without complications: Secondary | ICD-10-CM | POA: Insufficient documentation

## 2013-06-27 DIAGNOSIS — R5383 Other fatigue: Secondary | ICD-10-CM | POA: Insufficient documentation

## 2013-06-27 DIAGNOSIS — Z885 Allergy status to narcotic agent status: Secondary | ICD-10-CM | POA: Insufficient documentation

## 2013-06-27 DIAGNOSIS — J310 Chronic rhinitis: Secondary | ICD-10-CM

## 2013-06-27 DIAGNOSIS — F17200 Nicotine dependence, unspecified, uncomplicated: Secondary | ICD-10-CM | POA: Insufficient documentation

## 2013-06-27 DIAGNOSIS — J069 Acute upper respiratory infection, unspecified: Secondary | ICD-10-CM | POA: Insufficient documentation

## 2013-06-27 DIAGNOSIS — K219 Gastro-esophageal reflux disease without esophagitis: Secondary | ICD-10-CM | POA: Insufficient documentation

## 2013-06-27 DIAGNOSIS — R0982 Postnasal drip: Secondary | ICD-10-CM | POA: Insufficient documentation

## 2013-06-27 DIAGNOSIS — Z79899 Other long term (current) drug therapy: Secondary | ICD-10-CM | POA: Insufficient documentation

## 2013-06-27 DIAGNOSIS — M79609 Pain in unspecified limb: Secondary | ICD-10-CM | POA: Insufficient documentation

## 2013-06-27 DIAGNOSIS — Z8619 Personal history of other infectious and parasitic diseases: Secondary | ICD-10-CM | POA: Insufficient documentation

## 2013-06-27 DIAGNOSIS — I10 Essential (primary) hypertension: Secondary | ICD-10-CM | POA: Insufficient documentation

## 2013-06-27 DIAGNOSIS — R112 Nausea with vomiting, unspecified: Secondary | ICD-10-CM | POA: Insufficient documentation

## 2013-06-27 DIAGNOSIS — M545 Low back pain, unspecified: Secondary | ICD-10-CM | POA: Insufficient documentation

## 2013-06-27 DIAGNOSIS — Z888 Allergy status to other drugs, medicaments and biological substances status: Secondary | ICD-10-CM | POA: Insufficient documentation

## 2013-06-27 DIAGNOSIS — R05 Cough: Secondary | ICD-10-CM | POA: Insufficient documentation

## 2013-06-27 MED ORDER — FLUTICASONE PROPIONATE 50 MCG/ACT NA SUSP: 2.0000 | Freq: Every day | NASAL | Status: DC

## 2013-06-27 NOTE — ED Provider Notes (Signed)
Medical screening examination/treatment/procedure(s) were performed by non-physician practitioner and as supervising physician I was immediately available for consultation/collaboration.   EKG Interpretation None        Wandra Arthurs, MD 06/27/13 1546

## 2013-06-27 NOTE — ED Notes (Signed)
Patient returned from radiology

## 2013-06-27 NOTE — Discharge Instructions (Signed)
8 Flonase as directed for your nasal congestion, use nasal saline intermittently throughout the day. Rest and stay well-hydrated. Followup with your primary care physician.  Cool Mist Vaporizers Vaporizers may help relieve the symptoms of a cough and cold. They add moisture to the air, which helps mucus to become thinner and less sticky. This makes it easier to breathe and cough up secretions. Cool mist vaporizers do not cause serious burns like hot mist vaporizers ("steamers, humidifiers"). Vaporizers have not been proved to show they help with colds. You should not use a vaporizer if you are allergic to mold.  HOME CARE INSTRUCTIONS  Follow the package instructions for the vaporizer.  Do not use anything other than distilled water in the vaporizer.  Do not run the vaporizer all of the time. This can cause mold or bacteria to grow in the vaporizer.  Clean the vaporizer after each time it is used.  Clean and dry the vaporizer well before storing it.  Stop using the vaporizer if worsening respiratory symptoms develop. Document Released: 11/15/2003 Document Revised: 10/20/2012 Document Reviewed: 07/07/2012 [facility] Patient Information 2014 [place], [place].  Upper Respiratory Infection, Adult An upper respiratory infection (URI) is also sometimes known as the common cold. The upper respiratory tract includes the nose, sinuses, throat, trachea, and bronchi. Bronchi are the airways leading to the lungs. Most people improve within 1 week, but symptoms can last up to 2 weeks. A residual cough may last even longer.  CAUSES Many different viruses can infect the tissues lining the upper respiratory tract. The tissues become irritated and inflamed and often become very moist. Mucus production is also common. A cold is contagious. You can easily spread the virus to others by oral contact. This includes kissing, sharing a glass, coughing, or sneezing. Touching your mouth or nose and then touching a  surface, which is then touched by another person, can also spread the virus. SYMPTOMS  Symptoms typically develop 1 to 3 days after you come in contact with a cold virus. Symptoms vary from person to person. They may include:  Runny nose.  Sneezing.  Nasal congestion.  Sinus irritation.  Sore throat.  Loss of voice (laryngitis).  Cough.  Fatigue.  Muscle aches.  Loss of appetite.  Headache.  Low-grade fever. DIAGNOSIS  You might diagnose your own cold based on familiar symptoms, since most people get a cold 2 to 3 times a year. Your caregiver can confirm this based on your exam. Most importantly, your caregiver can check that your symptoms are not due to another disease such as strep throat, sinusitis, pneumonia, asthma, or epiglottitis. Blood tests, throat tests, and X-rays are not necessary to diagnose a common cold, but they may sometimes be helpful in excluding other more serious diseases. Your caregiver will decide if any further tests are required. RISKS AND COMPLICATIONS  You may be at risk for a more severe case of the common cold if you smoke cigarettes, have chronic heart disease (such as heart failure) or lung disease (such as asthma), or if you have a weakened immune system. The very young and very old are also at risk for more serious infections. Bacterial sinusitis, middle ear infections, and bacterial pneumonia can complicate the common cold. The common cold can worsen asthma and chronic obstructive pulmonary disease (COPD). Sometimes, these complications can require emergency medical care and may be life-threatening. PREVENTION  The best way to protect against getting a cold is to practice good hygiene. Avoid oral or hand contact with  people with cold symptoms. Wash your hands often if contact occurs. There is no clear evidence that vitamin C, vitamin E, echinacea, or exercise reduces the chance of developing a cold. However, it is always recommended to get plenty of  rest and practice good nutrition. TREATMENT  Treatment is directed at relieving symptoms. There is no cure. Antibiotics are not effective, because the infection is caused by a virus, not by bacteria. Treatment may include:  Increased fluid intake. Sports drinks offer valuable electrolytes, sugars, and fluids.  Breathing heated mist or steam (vaporizer or shower).  Eating chicken soup or other clear broths, and maintaining good nutrition.  Getting plenty of rest.  Using gargles or lozenges for comfort.  Controlling fevers with ibuprofen or acetaminophen as directed by your caregiver.  Increasing usage of your inhaler if you have asthma. Zinc gel and zinc lozenges, taken in the first 24 hours of the common cold, can shorten the duration and lessen the severity of symptoms. Pain medicines may help with fever, muscle aches, and throat pain. A variety of non-prescription medicines are available to treat congestion and runny nose. Your caregiver can make recommendations and may suggest nasal or lung inhalers for other symptoms.  HOME CARE INSTRUCTIONS   Only take over-the-counter or prescription medicines for pain, discomfort, or fever as directed by your caregiver.  Use a warm mist humidifier or inhale steam from a shower to increase air moisture. This may keep secretions moist and make it easier to breathe.  Drink enough water and fluids to keep your urine clear or pale yellow.  Rest as needed.  Return to work when your temperature has returned to normal or as your caregiver advises. You may need to stay home longer to avoid infecting others. You can also use a face mask and careful hand washing to prevent spread of the virus. SEEK MEDICAL CARE IF:   After the first few days, you feel you are getting worse rather than better.  You need your caregiver's advice about medicines to control symptoms.  You develop chills, worsening shortness of breath, or brown or red sputum. These may be  signs of pneumonia.  You develop yellow or brown nasal discharge or pain in the face, especially when you bend forward. These may be signs of sinusitis.  You develop a fever, swollen neck glands, pain with swallowing, or white areas in the back of your throat. These may be signs of strep throat. SEEK IMMEDIATE MEDICAL CARE IF:   You have a fever.  You develop severe or persistent headache, ear pain, sinus pain, or chest pain.  You develop wheezing, a prolonged cough, cough up blood, or have a change in your usual mucus (if you have chronic lung disease).  You develop sore muscles or a stiff neck. Document Released: 08/13/2000 Document Revised: 05/12/2011 Document Reviewed: 06/21/2010 [facility] Patient Information 2014 [place], [place].

## 2013-06-27 NOTE — ED Provider Notes (Signed)
CSN: [id number]     Arrival date & time 06/27/13  7829 History  This chart was scribed for non-physician practitioner, Michele Mcalpine, PA-C, working with Wandra Arthurs, MD, by Sydell Axon, ED Scribe. This patient was seen in room TR07C/TR07C and the patient's care was started at 10:50 AM.  The history is provided by the patient. No language interpreter was used.   HPI Comments: Wendy [name] is a 68 y.o. female who presents to the Emergency Department with a chief complaint of congestion, cough, chills and myalgias with onset 2 days ago. Patient reports that symptoms began at night with a productive cough with dark gray sputum. She reports she has felt fatigued since developing a cough. Patient reports her symptoms have progressively worsened to include night sweats, sore throat, wheezing, runny nose, nausea, vomiting, diarrhea . Patient reports taking alkal-seltzer plus with no relief. She denies coughing blood. Patient confirms being around sick contacts recently.   Past Medical History  Diagnosis Date  . 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 HYPERTENSION 11/05/2006  . GERD 11/20/2006  . LOW BACK PAIN SYNDROME 07/21/2007  . FOOT PAIN 05/23/2009  . DEPENDENT EDEMA 07/21/2007  . 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 Rheumatoid arthritis(714.0) 11/05/2006    sees Dr. Gavin Pound    Past Surgical History  Procedure Laterality Date  . Lumbar laminectomy      1995  . Esophagogastroduodenoscopy  06-20-08    at [facility], clear   . Colonoscopy  06-20-08    at [facility], clear    Family History  Problem Relation Age of Onset  . Arthritis    . Diabetes    . Hypertension    . Cancer      lung   History  Substance Use Topics  . Smoking status: Current Some Day Smoker    Types: Cigarettes  . Smokeless tobacco: Never Used     Comment: last time was about 1 month ago  . Alcohol Use: No   OB History   Grav Para Term Preterm  Abortions TAB SAB Ect Mult Living                 Review of Systems  Constitutional: Positive for chills, diaphoresis and fatigue. Negative for fever.  HENT: Positive for congestion, postnasal drip, rhinorrhea, sinus pressure and sore throat.   Respiratory: Positive for cough and wheezing. Negative for chest tightness and shortness of breath.   Cardiovascular: Negative for chest pain.  Gastrointestinal: Positive for nausea, vomiting and diarrhea. Negative for abdominal pain.  Endocrine: Positive for cold intolerance.  Musculoskeletal: Positive for myalgias.  Neurological: Negative for dizziness, weakness and headaches.  All other systems reviewed and are negative. A complete 10 system review of systems was obtained and all systems are negative except as noted in the HPI and PMH.   Allergies  Aspirin and Tramadol  Home Medications   Prior to Admission medications   Medication Sig Start Date End Date Taking? Authorizing Provider  ALPRAZolam Duanne Moron) 0.5 MG tablet Take 1 tablet (0.5 mg total) by mouth at bedtime as needed for sleep. 12/13/12   Laurey Morale, MD  benzonatate (TESSALON) 100 MG capsule Take 1 capsule (100 mg total) by mouth 2 (two) times daily as needed for cough. 06/10/13   Lucretia Kern, DO  cefUROXime (CEFTIN) 500 MG tablet Take 1 tablet (500 mg total) by mouth  2 (two) times daily with a meal. 04/01/13   Laurey Morale, MD  cyclobenzaprine (FLEXERIL) 10 MG tablet Take 1 tablet (10 mg total) by mouth 3 (three) times daily as needed for muscle spasms. 04/01/13   Laurey Morale, MD  furosemide (LASIX) 20 MG tablet Take 20 mg by mouth daily as needed (fluid retention).    Historical Provider, MD  gabapentin (NEURONTIN) 100 MG capsule Take 1 capsule (100 mg total) by mouth 3 (three) times daily. 06/29/12   Laurey Morale, MD  glipiZIDE (GLUCOTROL) 10 MG tablet Take 10 mg by mouth 2 (two) times daily before a meal.    Historical Provider, MD  glucose blood test strip Use as instructed  11/24/12   Laurey Morale, MD  HYDROcodone-acetaminophen (NORCO) 10-325 MG per tablet Take 1 tablet by mouth every 6 (six) hours as needed for moderate pain. 04/22/13   Laurey Morale, MD  HYDROcodone-homatropine (HYDROMET) 5-1.5 MG/5ML syrup Take 5 mLs by mouth every 4 (four) hours as needed for cough. 12/21/12   Laurey Morale, MD  hydroxychloroquine (PLAQUENIL) 200 MG tablet Take 1 tablet (200 mg total) by mouth daily. 01/13/12   Laurey Morale, MD  lisinopril-hydrochlorothiazide (PRINZIDE,ZESTORETIC) 20-25 MG per tablet TAKE 1 TABLET EVERY DAY 04/05/13   Laurey Morale, MD  metFORMIN (GLUCOPHAGE) 1000 MG tablet Take 1,000 mg by mouth daily with breakfast.    Historical Provider, MD  methocarbamol (ROBAXIN) 750 MG tablet Take 1 tablet (750 mg total) by mouth 4 (four) times daily as needed (Take 1 tablet every 6 hours as needed for muscle spasms.). 04/28/12   Hannah Muthersbaugh, PA-C  naproxen (NAPROSYN) 500 MG tablet Take 1 tablet (500 mg total) by mouth 2 (two) times daily as needed. 04/28/12   Hannah Muthersbaugh, PA-C  omeprazole (PRILOSEC) 40 MG capsule TAKE 1 CAPSULE EVERY DAY 11/18/12   Laurey Morale, MD  promethazine (PHENERGAN) 25 MG tablet Take 1 tablet (25 mg total) by mouth every 4 (four) hours as needed for nausea or vomiting. 04/04/13   Laurey Morale, MD  promethazine-codeine (PHENERGAN WITH CODEINE) 6.25-10 MG/5ML syrup Take 5 mLs by mouth every 4 (four) hours as needed for cough. 03/04/13   Neena Rhymes, MD   Triage Vitals: BP 113/70  Pulse 76  Temp(Src) 97.8 F (36.6 C) (Oral)  Resp 16  Ht 5\' 4"  (1.626 m)  Wt 190 lb (86.183 kg)  BMI 32.60 kg/m2  SpO2 100%  Physical Exam  Nursing note and vitals reviewed. Constitutional: She is oriented to person, place, and time. She appears well-developed and well-nourished. No distress.  HENT:  Head: Normocephalic and atraumatic.  Right Ear: Tympanic membrane normal.  Left Ear: Tympanic membrane normal.  Nose: Mucosal edema present.   Mouth/Throat: Oropharynx is clear and moist. No oropharyngeal exudate or posterior oropharyngeal erythema.  Nasal congestion. Post-nasal drip.  Eyes: EOM are normal.  Neck: Neck supple. No tracheal deviation present.  Cardiovascular: Normal rate and normal heart sounds.   Pulmonary/Chest: Effort normal. No respiratory distress. She has no wheezes.  Abdominal: Soft. Bowel sounds are normal.  Musculoskeletal: Normal range of motion.  Neurological: She is alert and oriented to person, place, and time.  Skin: Skin is warm and dry.  Psychiatric: She has a normal mood and affect. Her behavior is normal.    ED Course  Procedures (including critical care time)  DIAGNOSTIC STUDIES: Oxygen Saturation is 100% on room air, normal by my interpretation.    COORDINATION  OF CARE: 10:55 AM-Discussed negative x-ray findings indicating no PNA. Will prescribe nasal saline and flonase to help with sinus congestion. Recommended f/u with PCP. Treatment plan discussed with patient and patient agrees.  Labs Review Labs Reviewed - No data to display  Imaging Review Dg Chest 2 View  06/27/2013   CLINICAL DATA:  Cough.  EXAM: CHEST  2 VIEW  COMPARISON:  No recent.  FINDINGS: Mediastinum and hilar structures are normal. Mild cardiomegaly. Pulmonary vascularity is normal. Mild lingular atelectasis versus prominent fat pad noted. No acute bony abnormality.  IMPRESSION: Mild lingular subsegmental atelectasis versus prominent fat pad. Exam otherwise unremarkable.   Electronically Signed   By: Marcello Moores  Register   On: 06/27/2013 10:37    EKG Interpretation None      MDM   Final diagnoses:  URI (upper respiratory infection)  Rhinitis   Patient well appearing and in no apparent distress, afebrile, normal vital signs. O2 sat 100% on room air. Chest x-ray obtained prior to patient being seen, mild lingular subsegmental atelectasis versus prominent fat pad, otherwise no acute findings. Lungs clear. Prescribed  Flonase for her rhinitis, advised nasal saline, rest and hydration. Stable for discharge. Followup with PCP. Return precautions given. Patient states understanding of treatment care plan and is agreeable.   I personally performed the services described in this documentation, which was scribed in my presence. The recorded information has been reviewed and is accurate.    Illene Labrador, PA-C 06/27/13 1101

## 2013-06-27 NOTE — ED Notes (Signed)
Pt presents with nasal congestion, chest congestion, chills, and generalized body aches x2 days. Taking OTC meds last night with no relief

## 2013-07-19 NOTE — Telephone Encounter (Signed)
error 

## 2013-07-20 ENCOUNTER — Ambulatory Visit (INDEPENDENT_AMBULATORY_CARE_PROVIDER_SITE_OTHER): Payer: Commercial Managed Care - HMO | Admitting: Family Medicine

## 2013-07-20 ENCOUNTER — Encounter: Payer: Self-pay | Admitting: Family Medicine

## 2013-07-20 VITALS — BP 110/74 | HR 98 | Temp 98.7°F | Ht 64.0 in | Wt 183.0 lb

## 2013-07-20 DIAGNOSIS — M79672 Pain in left foot: Secondary | ICD-10-CM

## 2013-07-20 DIAGNOSIS — M545 Low back pain, unspecified: Secondary | ICD-10-CM

## 2013-07-20 DIAGNOSIS — M79671 Pain in right foot: Secondary | ICD-10-CM

## 2013-07-20 DIAGNOSIS — I10 Essential (primary) hypertension: Secondary | ICD-10-CM

## 2013-07-20 DIAGNOSIS — E119 Type 2 diabetes mellitus without complications: Secondary | ICD-10-CM

## 2013-07-20 DIAGNOSIS — Z0000 Encounter for general adult medical examination without abnormal findings: Secondary | ICD-10-CM

## 2013-07-20 DIAGNOSIS — M069 Rheumatoid arthritis, unspecified: Secondary | ICD-10-CM

## 2013-07-20 DIAGNOSIS — M79609 Pain in unspecified limb: Secondary | ICD-10-CM

## 2013-07-20 MED ORDER — GABAPENTIN 100 MG PO CAPS: 100.0000 mg | ORAL_CAPSULE | Freq: Every day | ORAL | Status: DC

## 2013-07-20 MED ORDER — HYDROCODONE-ACETAMINOPHEN 10-325 MG PO TABS: 1.0000 | ORAL_TABLET | Freq: Four times a day (QID) | ORAL | Status: DC | PRN

## 2013-07-20 MED ORDER — FUROSEMIDE 20 MG PO TABS: 20.0000 mg | ORAL_TABLET | Freq: Every day | ORAL | Status: DC | PRN

## 2013-07-20 MED ORDER — CETIRIZINE HCL 10 MG PO TABS: 10.0000 mg | ORAL_TABLET | Freq: Every day | ORAL | Status: DC

## 2013-07-20 MED ORDER — OMEPRAZOLE 40 MG PO CPDR: 40.0000 mg | DELAYED_RELEASE_CAPSULE | Freq: Every day | ORAL | Status: DC

## 2013-07-20 MED ORDER — HYDROXYCHLOROQUINE SULFATE 200 MG PO TABS: 200.0000 mg | ORAL_TABLET | Freq: Every day | ORAL | Status: DC

## 2013-07-20 MED ORDER — FLUTICASONE PROPIONATE 50 MCG/ACT NA SUSP: 2.0000 | Freq: Every day | NASAL | Status: DC

## 2013-07-20 MED ORDER — LISINOPRIL-HYDROCHLOROTHIAZIDE 20-25 MG PO TABS: 1.0000 | ORAL_TABLET | Freq: Every day | ORAL | Status: DC

## 2013-07-20 MED ORDER — METFORMIN HCL 1000 MG PO TABS: 1000.0000 mg | ORAL_TABLET | Freq: Every evening | ORAL | Status: DC

## 2013-07-20 MED ORDER — GLIPIZIDE 10 MG PO TABS: 10.0000 mg | ORAL_TABLET | Freq: Two times a day (BID) | ORAL | Status: DC

## 2013-07-20 NOTE — Progress Notes (Signed)
   Subjective:    Patient ID: Wendy [name], female    DOB: 12-Mar-1945, 68 y.o.   MRN: [id number]  HPI Here to follow up an ER visit on 06-27-13 for URI symptoms. Her CXR as clear and this was felt to be viral. Most of these sx have resolved but she still has daily stuffy head and nose with PND. Using Flonase and Zyrtec. She needs to see a new orthopedist for her feet per her insurance company. She needs a referral for GYN for a pelvic exam.    Review of Systems  Constitutional: Negative.   HENT: Positive for congestion, postnasal drip and rhinorrhea.   Eyes: Negative.   Respiratory: Negative.        Objective:   Physical Exam  Constitutional: She appears well-developed and well-nourished.  HENT:  Right Ear: External ear normal.  Left Ear: External ear normal.  Nose: Nose normal.  Mouth/Throat: Oropharynx is clear and moist.  Eyes: Conjunctivae are normal.  Pulmonary/Chest: Effort normal and breath sounds normal.  Lymphadenopathy:    She has no cervical adenopathy.          Assessment & Plan:  Stay on Flonase and Zyrtec. Refer to Dr. Landry Mellow and to Dr. French Ana.

## 2013-07-20 NOTE — Progress Notes (Signed)
Pre visit review using our clinic review tool, if applicable. No additional management support is needed unless otherwise documented below in the visit note. 

## 2013-07-21 ENCOUNTER — Telehealth: Payer: Self-pay | Admitting: Family Medicine

## 2013-07-21 NOTE — Telephone Encounter (Signed)
Pt states the cetirizine (ZYRTEC) 10 MG tablet was $36.00 and she cannot afford this. Is there anything he can do to help her w/ this med?  Advised pt to call around and see if anyone is less expensive.

## 2013-07-22 NOTE — Telephone Encounter (Signed)
I spoke with pt and she did get the generic.

## 2013-07-27 ENCOUNTER — Telehealth: Payer: Self-pay | Admitting: Family Medicine

## 2013-07-27 DIAGNOSIS — M545 Low back pain, unspecified: Secondary | ICD-10-CM

## 2013-07-27 NOTE — Telephone Encounter (Signed)
Referral was done  

## 2013-07-27 NOTE — Telephone Encounter (Signed)
I spoke with pt  

## 2013-07-27 NOTE — Telephone Encounter (Signed)
Pt said she need a referral to see Dr Sampson Goon at [facility] who did her back surgery in 2010  per Dr Jackqulyn Livings who she saw on 07/26/13

## 2013-08-03 ENCOUNTER — Telehealth: Payer: Self-pay | Admitting: Family Medicine

## 2013-08-03 NOTE — Telephone Encounter (Signed)
Pt needs a rx for diabetic shoes fax to attn joanna [phone]

## 2013-08-08 ENCOUNTER — Other Ambulatory Visit: Payer: Self-pay | Admitting: Obstetrics and Gynecology

## 2013-08-08 ENCOUNTER — Other Ambulatory Visit (HOSPITAL_COMMUNITY)
Admission: RE | Admit: 2013-08-08 | Discharge: 2013-08-08 | Disposition: A | Discharge status: Home / Self Care | Payer: Medicare HMO | Source: Ambulatory Visit | Attending: Obstetrics and Gynecology | Admitting: Obstetrics and Gynecology

## 2013-08-08 DIAGNOSIS — Z1151 Encounter for screening for human papillomavirus (HPV): Secondary | ICD-10-CM | POA: Insufficient documentation

## 2013-08-08 DIAGNOSIS — Z124 Encounter for screening for malignant neoplasm of cervix: Secondary | ICD-10-CM | POA: Insufficient documentation

## 2013-08-08 NOTE — Telephone Encounter (Signed)
done

## 2013-08-08 NOTE — Telephone Encounter (Signed)
Script was faxed to below number.  

## 2013-08-10 LAB — CYTOLOGY - PAP

## 2013-08-17 ENCOUNTER — Other Ambulatory Visit: Payer: Self-pay | Admitting: Family Medicine

## 2013-08-30 ENCOUNTER — Telehealth: Payer: Self-pay | Admitting: Family Medicine

## 2013-08-30 NOTE — Telephone Encounter (Signed)
Pt would like a statement from dr fry stating she is having medical complications at the present. Pt has registered for cna classes and they start July 6.  Pt would like to put off they classes, and w/ a doc's note,stating she is having medical complications right now,  they will post pone classes for her and she will not lose her money. Needs to be addressed to Arrie Aran at [employer] [facility] [address]. pls advise

## 2013-09-05 NOTE — Telephone Encounter (Signed)
Note is ready and I spoke with pt.  

## 2013-09-05 NOTE — Telephone Encounter (Signed)
The note is ready

## 2013-09-05 NOTE — Telephone Encounter (Signed)
Pt is calling back to see if dr. Sarajane Jews can get the note written today, pt states the instructor is needing asap.

## 2013-09-21 ENCOUNTER — Other Ambulatory Visit: Payer: Self-pay | Admitting: Obstetrics and Gynecology

## 2013-09-27 ENCOUNTER — Telehealth: Payer: Self-pay | Admitting: *Deleted

## 2013-09-27 DIAGNOSIS — E119 Type 2 diabetes mellitus without complications: Secondary | ICD-10-CM

## 2013-09-27 NOTE — Telephone Encounter (Signed)
Left message on machine for patient to schedule an office visit for diabetes Lipid, a1c, bmet, micro albumin ordered Diabetic bundle

## 2013-09-27 NOTE — Telephone Encounter (Signed)
Pt returned call re: office visit and diabetic bundle lbs requested.  Stated she has too many appointments to keep up with at this time and declined to schedule.  Best number to call pt is [phone]

## 2013-10-05 ENCOUNTER — Other Ambulatory Visit: Payer: Self-pay | Admitting: Surgery

## 2013-10-05 DIAGNOSIS — M545 Low back pain, unspecified: Secondary | ICD-10-CM

## 2013-10-12 ENCOUNTER — Ambulatory Visit
Admission: RE | Admit: 2013-10-12 | Discharge: 2013-10-12 | Disposition: A | Discharge status: Home / Self Care | Payer: Commercial Managed Care - HMO | Source: Ambulatory Visit | Attending: Surgery | Admitting: Surgery

## 2013-10-12 DIAGNOSIS — M545 Low back pain, unspecified: Secondary | ICD-10-CM

## 2013-10-18 ENCOUNTER — Telehealth: Payer: Self-pay | Admitting: Family Medicine

## 2013-10-18 NOTE — Telephone Encounter (Signed)
Pt has ordered diabetic shoes from [facility] and they haven't heard form the dr. Abbott Pao has appt on wed and will fu thwn

## 2013-10-19 ENCOUNTER — Ambulatory Visit (INDEPENDENT_AMBULATORY_CARE_PROVIDER_SITE_OTHER): Payer: Commercial Managed Care - HMO | Admitting: Family Medicine

## 2013-10-19 ENCOUNTER — Encounter: Payer: Self-pay | Admitting: Family Medicine

## 2013-10-19 ENCOUNTER — Ambulatory Visit: Payer: Commercial Managed Care - HMO | Admitting: Family Medicine

## 2013-10-19 VITALS — BP 146/92 | HR 82 | Temp 97.9°F | Ht 64.0 in | Wt 186.0 lb

## 2013-10-19 DIAGNOSIS — B373 Candidiasis of vulva and vagina: Secondary | ICD-10-CM

## 2013-10-19 DIAGNOSIS — I10 Essential (primary) hypertension: Secondary | ICD-10-CM

## 2013-10-19 DIAGNOSIS — R609 Edema, unspecified: Secondary | ICD-10-CM

## 2013-10-19 DIAGNOSIS — B3731 Acute candidiasis of vulva and vagina: Secondary | ICD-10-CM

## 2013-10-19 DIAGNOSIS — E119 Type 2 diabetes mellitus without complications: Secondary | ICD-10-CM

## 2013-10-19 LAB — HEPATIC FUNCTION PANEL
ALT: 13 U/L (ref 0–35)
AST: 18 U/L (ref 0–37)
Albumin: 3.5 g/dL (ref 3.5–5.2)
Alkaline Phosphatase: 49 U/L (ref 39–117)
Bilirubin, Direct: 0.1 mg/dL (ref 0.0–0.3)
Total Bilirubin: 0.8 mg/dL (ref 0.2–1.2)
Total Protein: 7.7 g/dL (ref 6.0–8.3)

## 2013-10-19 LAB — HEMOGLOBIN A1C: Hgb A1c MFr Bld: 8.6 % — ABNORMAL HIGH (ref 4.6–6.5)

## 2013-10-19 LAB — MICROALBUMIN / CREATININE URINE RATIO
Creatinine,U: 158.1 mg/dL
Microalb Creat Ratio: 0.2 mg/g (ref 0.0–30.0)
Microalb, Ur: 0.3 mg/dL (ref 0.0–1.9)

## 2013-10-19 LAB — BASIC METABOLIC PANEL
BUN: 12 mg/dL (ref 6–23)
CO2: 30 mEq/L (ref 19–32)
Calcium: 9 mg/dL (ref 8.4–10.5)
Chloride: 101 mEq/L (ref 96–112)
Creatinine, Ser: 0.8 mg/dL (ref 0.4–1.2)
GFR: 93.42 mL/min (ref >60.00)
Glucose, Bld: 132 mg/dL — ABNORMAL HIGH (ref 70–99)
Potassium: 3.3 mEq/L — ABNORMAL LOW (ref 3.5–5.1)
Sodium: 139 mEq/L (ref 135–145)

## 2013-10-19 LAB — LIPID PANEL
Cholesterol: 196 mg/dL (ref 0–200)
HDL: 60.6 mg/dL (ref >39.00)
LDL Cholesterol: 123 mg/dL — ABNORMAL HIGH (ref 0–99)
NonHDL: 135.4
Total CHOL/HDL Ratio: 3
Triglycerides: 62 mg/dL (ref 0.0–149.0)
VLDL: 12.4 mg/dL (ref 0.0–40.0)

## 2013-10-19 MED ORDER — HYDROCODONE-ACETAMINOPHEN 10-325 MG PO TABS: 1.0000 | ORAL_TABLET | Freq: Four times a day (QID) | ORAL | Status: DC | PRN

## 2013-10-19 NOTE — Progress Notes (Signed)
   Subjective:    Patient ID: Wendy [name], female    DOB: [birth year], 68 y.o.   MRN: [id number]  HPI Here to follow up. She feels well except for her low back pain. Her BP has been stable. She recently saw Dr. Coralyn Pear and her liver shows no signs of hepatitis C at all.    Review of Systems  Constitutional: Negative.   Respiratory: Negative.   Cardiovascular: Negative.   Neurological: Negative.        Objective:   Physical Exam  Constitutional: She appears well-developed and well-nourished.  Neck: No thyromegaly present.  Cardiovascular: Normal rate, regular rhythm, normal heart sounds and intact distal pulses.   Pulmonary/Chest: Effort normal and breath sounds normal.  Lymphadenopathy:    She has no cervical adenopathy.          Assessment & Plan:  Get fasting labs

## 2013-10-19 NOTE — Progress Notes (Signed)
Pre visit review using our clinic review tool, if applicable. No additional management support is needed unless otherwise documented below in the visit note. 

## 2013-10-20 ENCOUNTER — Other Ambulatory Visit: Payer: Self-pay | Admitting: *Deleted

## 2013-10-20 ENCOUNTER — Telehealth: Payer: Self-pay | Admitting: Family Medicine

## 2013-10-20 DIAGNOSIS — M25579 Pain in unspecified ankle and joints of unspecified foot: Secondary | ICD-10-CM

## 2013-10-20 MED ORDER — SITAGLIPTIN PHOSPHATE 100 MG PO TABS: 100.0000 mg | ORAL_TABLET | Freq: Every day | ORAL | Status: DC

## 2013-10-20 MED ORDER — POTASSIUM CHLORIDE CRYS ER 10 MEQ PO TBCR: 10.0000 meq | EXTENDED_RELEASE_TABLET | Freq: Two times a day (BID) | ORAL | Status: DC

## 2013-10-20 NOTE — Telephone Encounter (Signed)
Okay to schedule here or podiatry?

## 2013-10-20 NOTE — Telephone Encounter (Signed)
I will refer her to Podiatry.

## 2013-10-20 NOTE — Telephone Encounter (Signed)
Wendy [name] from [employer] states that for medicare the provider that is treating her diabetes has to do her diabetic foot exam.  It has to be noted "what is seen or not seen".  Okay to schedule the patient?

## 2013-10-20 NOTE — Telephone Encounter (Signed)
New Rx for potassium and Januvia

## 2013-10-20 NOTE — Telephone Encounter (Signed)
Pt needs to have a diabetic foot exam in order for her insurance to approve the diabetic shoe request received.   [phone])

## 2013-10-21 NOTE — Telephone Encounter (Signed)
Pt is scheduled for 10/25/13.

## 2013-10-21 NOTE — Telephone Encounter (Signed)
Okay have her come in just for the foot exam

## 2013-10-26 ENCOUNTER — Ambulatory Visit (INDEPENDENT_AMBULATORY_CARE_PROVIDER_SITE_OTHER): Payer: Commercial Managed Care - HMO | Admitting: Family Medicine

## 2013-10-26 ENCOUNTER — Encounter: Payer: Self-pay | Admitting: Family Medicine

## 2013-10-26 VITALS — BP 117/78 | HR 102 | Temp 99.1°F | Ht 64.0 in | Wt 184.0 lb

## 2013-10-26 DIAGNOSIS — E119 Type 2 diabetes mellitus without complications: Secondary | ICD-10-CM

## 2013-10-26 DIAGNOSIS — Z23 Encounter for immunization: Secondary | ICD-10-CM

## 2013-10-26 NOTE — Progress Notes (Signed)
Pre visit review using our clinic review tool, if applicable. No additional management support is needed unless otherwise documented below in the visit note. 

## 2013-10-26 NOTE — Progress Notes (Signed)
   Subjective:    Patient ID: Wendy [name], female    DOB: 04/15/1945, 68 y.o.   MRN: [id number]  HPI Here for a detailed foot exam so she can be approved for diabetic shoes. She has not had any problems with her feet.    Review of Systems  Constitutional: Negative.   Skin: Negative.   Neurological: Negative.   Hematological: Negative.        Objective:   Physical Exam  Constitutional: She appears well-developed and well-nourished.  Skin:  Both feet are warm and dry. DP and PT pulses are full. Sensation to light touch and to monofilament testing is normal. No evidence of skin breakdown or calluses or ulcerations.           Assessment & Plan:  Normal diabetic foot exam.

## 2013-11-04 ENCOUNTER — Ambulatory Visit: Payer: Self-pay | Admitting: Podiatry

## 2013-11-23 ENCOUNTER — Other Ambulatory Visit: Payer: Self-pay | Admitting: Family Medicine

## 2013-11-23 ENCOUNTER — Telehealth: Payer: Self-pay | Admitting: Family Medicine

## 2013-11-23 NOTE — Telephone Encounter (Signed)
Needs order for diabetic shoes called or faxed to [facility]. They are trying to charge her for them but if they have an order she only has to pay a copay. Pt was given a phone # to have MD/RN call order to. It is [phone].

## 2013-11-23 NOTE — Telephone Encounter (Signed)
rx is ready to fax  

## 2013-11-24 NOTE — Telephone Encounter (Signed)
I have tried to call the below number twice and its a automated system. I left a voice message for pt to get the fax number and then I can fax over the script, it is ready.

## 2013-11-25 NOTE — Telephone Encounter (Signed)
Wendy [name] the fax # is [phone]

## 2013-11-28 NOTE — Telephone Encounter (Signed)
I faxed script to below number.

## 2013-12-13 ENCOUNTER — Telehealth: Payer: Self-pay | Admitting: Family Medicine

## 2013-12-13 NOTE — Telephone Encounter (Addendum)
Pt following up on rx for diabetic shoes. Pt still has not got he shoes  [facility] is stating they have not heard from pt's insurance w/ the rx/ ot will call [employer] and cb w/ results of call.

## 2013-12-13 NOTE — Telephone Encounter (Signed)
I spoke with [facility] and they transferred my call to 4 different people. I could not get any answers as to why pt has not received her shoes yet. I faxed a script in September for shoes with a diagnosis code on it. I spoke with pt and advised her to try and find out what is going on? I was on the phone for 15 minutes trying to figure out what was going. Pt said that she will call insurance company again.

## 2013-12-13 NOTE — Telephone Encounter (Signed)
Pt called back and states she spoke w/ [employer].  They told pt Dr fry needs to call them, "clinical intake department" and tell them that pt needs these shoes, at [phone]

## 2013-12-15 NOTE — Telephone Encounter (Signed)
Di Kindle from [facility] states pt's needs to have a diabetic foot exam in order for her insurance company to approve the shoes.  She would like a callback from you advising her if an exam was done.  Their fax number is [phone]

## 2013-12-16 NOTE — Telephone Encounter (Signed)
I faxed our office note which clearly states that pt had a foot exam.

## 2013-12-20 ENCOUNTER — Telehealth: Payer: Self-pay | Admitting: Family Medicine

## 2013-12-20 NOTE — Telephone Encounter (Signed)
Wendy [name] with [facility] called very upset about what she felt was a breakdown in communication. In speaking with all documentation with messages and faxes did match up. Upon further discussion I found that her problem was with a Rx and supporting documentation. Dr Sarajane Jews did submit a signed Rx for the patient to have diabetic shoes and did preform a diabetic foot exam. The exam does not support the order so Medicare will not pay for the patients shoes. Patient does not have a supportive history to justify the shoes either. I called Wendy [name] back and explained that we cannot support the order and to please disregard the rx. I provided my name and number for callback.

## 2014-01-18 ENCOUNTER — Telehealth: Payer: Self-pay | Admitting: Family Medicine

## 2014-01-18 MED ORDER — HYDROCODONE-ACETAMINOPHEN 10-325 MG PO TABS: 1.0000 | ORAL_TABLET | Freq: Four times a day (QID) | ORAL | Status: DC | PRN

## 2014-01-18 NOTE — Telephone Encounter (Signed)
done

## 2014-01-18 NOTE — Telephone Encounter (Signed)
Pt request refill of the following: HYDROcodone-acetaminophen (NORCO) 10-325 MG per tablet ° ° °Phamacy: ° °

## 2014-01-18 NOTE — Telephone Encounter (Signed)
Script is ready for pick up, tried to reach pt by phone and no answer. 

## 2014-03-13 ENCOUNTER — Other Ambulatory Visit: Payer: Self-pay | Admitting: Family Medicine

## 2014-03-21 ENCOUNTER — Telehealth: Payer: Self-pay | Admitting: Family Medicine

## 2014-03-21 NOTE — Telephone Encounter (Signed)
Request for Accu-chek aviva plus meter, test strips, & solfclix lancets and send to [facility].

## 2014-03-22 NOTE — Telephone Encounter (Signed)
I left a voice message for pt to return my call. I need to know if pt wants to order these items?

## 2014-03-23 ENCOUNTER — Telehealth: Payer: Self-pay | Admitting: Family Medicine

## 2014-03-23 MED ORDER — ACCU-CHEK AVIVA PLUS W/DEVICE KIT: PACK | Status: DC

## 2014-03-23 MED ORDER — ACCU-CHEK SOFT TOUCH LANCETS MISC: Status: DC

## 2014-03-23 MED ORDER — GLUCOSE BLOOD VI STRP
ORAL_STRIP | Status: DC
Start: 2014-03-23 — End: 2014-09-26

## 2014-03-23 NOTE — Telephone Encounter (Signed)
I spoke with pt and she does want a script sent to [facility] for Accu chek aviva plus machine, strips, & lancets. I sent all 3 scripts e-scribe to [facility].

## 2014-04-13 ENCOUNTER — Telehealth: Payer: Self-pay | Admitting: Family Medicine

## 2014-04-13 NOTE — Telephone Encounter (Signed)
Pt needs new rx hydrocodone °

## 2014-04-14 DIAGNOSIS — Z1231 Encounter for screening mammogram for malignant neoplasm of breast: Secondary | ICD-10-CM | POA: Diagnosis not present

## 2014-04-14 MED ORDER — HYDROCODONE-ACETAMINOPHEN 10-325 MG PO TABS: 1.0000 | ORAL_TABLET | Freq: Four times a day (QID) | ORAL | Status: DC | PRN

## 2014-04-14 NOTE — Telephone Encounter (Signed)
Script is ready for pick up and I spoke with pt.  

## 2014-04-14 NOTE — Telephone Encounter (Signed)
done

## 2014-04-24 ENCOUNTER — Encounter: Payer: Self-pay | Admitting: Family Medicine

## 2014-05-17 DIAGNOSIS — B182 Chronic viral hepatitis C: Secondary | ICD-10-CM | POA: Diagnosis not present

## 2014-05-18 ENCOUNTER — Other Ambulatory Visit (HOSPITAL_COMMUNITY): Payer: Self-pay | Admitting: Nurse Practitioner

## 2014-05-18 DIAGNOSIS — B182 Chronic viral hepatitis C: Secondary | ICD-10-CM

## 2014-05-29 ENCOUNTER — Ambulatory Visit (HOSPITAL_COMMUNITY): Admission: RE | Admit: 2014-05-29 | Payer: Commercial Managed Care - HMO | Source: Ambulatory Visit

## 2014-07-18 ENCOUNTER — Other Ambulatory Visit: Payer: Self-pay | Admitting: Family Medicine

## 2014-07-18 MED ORDER — HYDROCODONE-ACETAMINOPHEN 10-325 MG PO TABS: 1.0000 | ORAL_TABLET | Freq: Four times a day (QID) | ORAL | Status: DC | PRN

## 2014-07-18 NOTE — Telephone Encounter (Signed)
Pt request refill of the following: HYDROcodone-acetaminophen (NORCO) 10-325 MG per tablet ° ° °Phamacy: ° °

## 2014-07-18 NOTE — Telephone Encounter (Signed)
done

## 2014-07-19 NOTE — Telephone Encounter (Signed)
Left a message for pt that rx is ready for pick up.

## 2014-08-17 ENCOUNTER — Other Ambulatory Visit: Payer: Self-pay | Admitting: Family Medicine

## 2014-08-31 ENCOUNTER — Other Ambulatory Visit: Payer: Self-pay | Admitting: Family Medicine

## 2014-08-31 MED ORDER — ALPRAZOLAM 0.5 MG PO TABS: 0.5000 mg | ORAL_TABLET | Freq: Every evening | ORAL | Status: DC | PRN

## 2014-08-31 NOTE — Telephone Encounter (Signed)
Pt request refill of the following: ALPRAZolam (XANAX) 0.5 MG tablet   Phamacy:   [facility]

## 2014-08-31 NOTE — Telephone Encounter (Signed)
rx called in

## 2014-08-31 NOTE — Telephone Encounter (Signed)
Call in #30 with 5 rf 

## 2014-09-13 ENCOUNTER — Other Ambulatory Visit: Payer: Self-pay | Admitting: Family Medicine

## 2014-09-15 ENCOUNTER — Other Ambulatory Visit: Payer: Self-pay | Admitting: Family Medicine

## 2014-09-18 NOTE — Telephone Encounter (Signed)
Can we refill this? 

## 2014-09-21 ENCOUNTER — Telehealth: Payer: Self-pay | Admitting: Family Medicine

## 2014-09-21 NOTE — Telephone Encounter (Signed)
Pt states that she need documentation stating that she is "who she says she is" and that he's her MD, so that she can get her social security card. Pt states that she's lost all her credentialing. Please advise.

## 2014-09-22 NOTE — Telephone Encounter (Signed)
She needs to make an OV to work this out

## 2014-09-22 NOTE — Telephone Encounter (Signed)
Pt has been sch

## 2014-09-26 ENCOUNTER — Ambulatory Visit (INDEPENDENT_AMBULATORY_CARE_PROVIDER_SITE_OTHER): Payer: Commercial Managed Care - HMO | Admitting: Family Medicine

## 2014-09-26 ENCOUNTER — Encounter: Payer: Self-pay | Admitting: Family Medicine

## 2014-09-26 VITALS — BP 148/96 | HR 72 | Temp 98.1°F | Ht 64.0 in | Wt 156.0 lb

## 2014-09-26 DIAGNOSIS — R609 Edema, unspecified: Secondary | ICD-10-CM

## 2014-09-26 DIAGNOSIS — E119 Type 2 diabetes mellitus without complications: Secondary | ICD-10-CM | POA: Diagnosis not present

## 2014-09-26 DIAGNOSIS — I10 Essential (primary) hypertension: Secondary | ICD-10-CM | POA: Diagnosis not present

## 2014-09-26 DIAGNOSIS — L910 Hypertrophic scar: Secondary | ICD-10-CM

## 2014-09-26 DIAGNOSIS — M069 Rheumatoid arthritis, unspecified: Secondary | ICD-10-CM

## 2014-09-26 LAB — CBC WITH DIFFERENTIAL/PLATELET
Basophils Absolute: 0 10*3/uL (ref 0.0–0.1)
Basophils Relative: 0.3 % (ref 0.0–3.0)
Eosinophils Absolute: 0 10*3/uL (ref 0.0–0.7)
Eosinophils Relative: 0.6 % (ref 0.0–5.0)
HCT: 41.1 % (ref 36.0–46.0)
Hemoglobin: 13.1 g/dL (ref 12.0–15.0)
Lymphocytes Relative: 32.9 % (ref 12.0–46.0)
Lymphs Abs: 2.4 10*3/uL (ref 0.7–4.0)
MCHC: 31.9 g/dL (ref 30.0–36.0)
MCV: 80.2 fl (ref 78.0–100.0)
Monocytes Absolute: 0.9 10*3/uL (ref 0.1–1.0)
Monocytes Relative: 12.8 % — ABNORMAL HIGH (ref 3.0–12.0)
Neutro Abs: 3.8 10*3/uL (ref 1.4–7.7)
Neutrophils Relative %: 53.4 % (ref 43.0–77.0)
Platelets: 226 10*3/uL (ref 150.0–400.0)
RBC: 5.13 Mil/uL — ABNORMAL HIGH (ref 3.87–5.11)
RDW: 17.4 % — ABNORMAL HIGH (ref 11.5–15.5)
WBC: 7.2 10*3/uL (ref 4.0–10.5)

## 2014-09-26 LAB — TSH: TSH: 1.83 u[IU]/mL (ref 0.35–4.50)

## 2014-09-26 LAB — BASIC METABOLIC PANEL
BUN: 12 mg/dL (ref 6–23)
CO2: 31 mEq/L (ref 19–32)
Calcium: 9.7 mg/dL (ref 8.4–10.5)
Chloride: 105 mEq/L (ref 96–112)
Creatinine, Ser: 0.86 mg/dL (ref 0.40–1.20)
GFR: 84.46 mL/min (ref >60.00)
Glucose, Bld: 55 mg/dL — ABNORMAL LOW (ref 70–99)
Potassium: 4.2 mEq/L (ref 3.5–5.1)
Sodium: 141 mEq/L (ref 135–145)

## 2014-09-26 LAB — HEMOGLOBIN A1C: Hgb A1c MFr Bld: 6.4 % (ref 4.6–6.5)

## 2014-09-26 LAB — HEPATIC FUNCTION PANEL
ALT: 14 U/L (ref 0–35)
AST: 20 U/L (ref 0–37)
Albumin: 3.5 g/dL (ref 3.5–5.2)
Alkaline Phosphatase: 77 U/L (ref 39–117)
Bilirubin, Direct: 0.1 mg/dL (ref 0.0–0.3)
Total Bilirubin: 0.7 mg/dL (ref 0.2–1.2)
Total Protein: 7.7 g/dL (ref 6.0–8.3)

## 2014-09-26 MED ORDER — HYDROXYCHLOROQUINE SULFATE 200 MG PO TABS: 200.0000 mg | ORAL_TABLET | Freq: Every day | ORAL | Status: DC

## 2014-09-26 NOTE — Progress Notes (Signed)
Pre visit review using our clinic review tool, if applicable. No additional management support is needed unless otherwise documented below in the visit note. 

## 2014-09-26 NOTE — Addendum Note (Signed)
Addended by: [name] A on: 09/26/2014 12:54 PM   Modules accepted: Orders

## 2014-09-26 NOTE — Progress Notes (Signed)
   Subjective:    Patient ID: Wendy [name], female    DOB: [birth year], 69 y.o.   MRN: [id number]  HPI Here to follow up problems including diabetes, HTN, RA, and edema. She has been feeling well and has been working to lose weight. In fact she has lost almost 30 lbs over the past year. Her BP is stable, and her glucoses are controlled, in fact she often gets low values. She has cut back on her medications, saying that she as stopped Januvia altogether and she has reduced the metformin to only 500 mg once a day. She still takes Glipizide as prescribed.    Review of Systems  Constitutional: Negative.   Respiratory: Negative.   Cardiovascular: Positive for leg swelling. Negative for chest pain and palpitations.  Gastrointestinal: Negative.   Endocrine: Negative.   Neurological: Negative.        Objective:   Physical Exam  Constitutional: She is oriented to person, place, and time. She appears well-developed and well-nourished.  Neck: No thyromegaly present.  Cardiovascular: Normal rate, regular rhythm, normal heart sounds and intact distal pulses.   Pulmonary/Chest: Effort normal and breath sounds normal. No respiratory distress. She has no wheezes. She has no rales.  Musculoskeletal:  1+ edema in both feet   Lymphadenopathy:    She has no cervical adenopathy.  Neurological: She is alert and oriented to person, place, and time.          Assessment & Plan:  Her HTN and edema and RA are stable. We will send her for labs today to assess her DM

## 2014-10-02 NOTE — Addendum Note (Signed)
Addended by: Aggie Hacker A on: 10/02/2014 08:41 AM   Modules accepted: Orders, Medications

## 2014-10-16 ENCOUNTER — Other Ambulatory Visit: Payer: Self-pay | Admitting: Family Medicine

## 2014-10-16 NOTE — Telephone Encounter (Signed)
Pt needs new rx  Hydrocodone.Pt is due by Friday.

## 2014-10-18 MED ORDER — HYDROCODONE-ACETAMINOPHEN 10-325 MG PO TABS: 1.0000 | ORAL_TABLET | Freq: Four times a day (QID) | ORAL | Status: DC | PRN

## 2014-10-18 NOTE — Telephone Encounter (Signed)
done

## 2014-10-18 NOTE — Telephone Encounter (Signed)
Called and spoke with pt and pt is aware rx is ready for pick up.  

## 2014-12-29 ENCOUNTER — Telehealth: Payer: Self-pay | Admitting: Family Medicine

## 2014-12-29 NOTE — Telephone Encounter (Signed)
Ok to schedule.

## 2014-12-29 NOTE — Telephone Encounter (Signed)
Pt is having burning when she urinates and request to see md on 01-05-15 in afternoon. Can I use sda slot?

## 2014-12-29 NOTE — Telephone Encounter (Signed)
I spoke with pt and advised that she schedule for Saturday clinic, she should not wait until next week if indeed she had a UTI. Pt declined and would like to schedule to see Dr. Sarajane Jews next week.

## 2014-12-30 ENCOUNTER — Other Ambulatory Visit: Payer: Self-pay | Admitting: Family Medicine

## 2015-01-01 NOTE — Telephone Encounter (Signed)
Pt has been sch

## 2015-01-05 ENCOUNTER — Ambulatory Visit: Payer: Commercial Managed Care - HMO | Admitting: Family Medicine

## 2015-01-08 ENCOUNTER — Other Ambulatory Visit: Payer: Self-pay | Admitting: Family Medicine

## 2015-01-08 NOTE — Telephone Encounter (Signed)
Can we refill this? 

## 2015-01-10 ENCOUNTER — Ambulatory Visit (INDEPENDENT_AMBULATORY_CARE_PROVIDER_SITE_OTHER): Payer: Commercial Managed Care - HMO | Admitting: Family Medicine

## 2015-01-10 ENCOUNTER — Encounter: Payer: Self-pay | Admitting: Family Medicine

## 2015-01-10 VITALS — BP 165/101 | HR 80 | Temp 98.8°F | Ht 64.0 in | Wt 154.0 lb

## 2015-01-10 DIAGNOSIS — R300 Dysuria: Secondary | ICD-10-CM | POA: Diagnosis not present

## 2015-01-10 DIAGNOSIS — N390 Urinary tract infection, site not specified: Secondary | ICD-10-CM | POA: Diagnosis not present

## 2015-01-10 DIAGNOSIS — R309 Painful micturition, unspecified: Secondary | ICD-10-CM

## 2015-01-10 LAB — POCT URINALYSIS DIPSTICK
Bilirubin, UA: NEGATIVE
Glucose, UA: NEGATIVE
Ketones, UA: NEGATIVE
Leukocytes, UA: NEGATIVE
Nitrite, UA: NEGATIVE
Protein, UA: NEGATIVE
Spec Grav, UA: >=1.03
Urobilinogen, UA: 0.2
pH, UA: 6

## 2015-01-10 MED ORDER — HYDROCODONE-ACETAMINOPHEN 10-325 MG PO TABS: 1.0000 | ORAL_TABLET | Freq: Four times a day (QID) | ORAL | Status: DC | PRN

## 2015-01-10 MED ORDER — CETIRIZINE HCL 10 MG PO TABS: 10.0000 mg | ORAL_TABLET | Freq: Every day | ORAL | Status: DC

## 2015-01-10 MED ORDER — CIPROFLOXACIN HCL 500 MG PO TABS: 500.0000 mg | ORAL_TABLET | Freq: Two times a day (BID) | ORAL | Status: DC

## 2015-01-10 NOTE — Progress Notes (Signed)
Pre visit review using our clinic review tool, if applicable. No additional management support is needed unless otherwise documented below in the visit note. 

## 2015-01-10 NOTE — Progress Notes (Signed)
   Subjective:    Patient ID: Wendy [name], female    DOB: 1945-11-02, 69 y.o.   MRN: [id number]  HPI Here for 2 weeks of increased urge to urinate and frequency. She feels lower abdominal pressure and some low back pain prior to passing urine. No fever, no blood in the urine. BMs are normal.    Review of Systems  Constitutional: Negative.   Respiratory: Negative.   Cardiovascular: Negative.   Gastrointestinal: Negative.   Genitourinary: Positive for dysuria and frequency. Negative for hematuria and flank pain.       Objective:   Physical Exam  Constitutional: She appears well-developed and well-nourished. No distress.  Cardiovascular: Normal rate, regular rhythm, normal heart sounds and intact distal pulses.   Pulmonary/Chest: Effort normal and breath sounds normal.  Abdominal: Soft. Bowel sounds are normal. She exhibits no distension and no mass. There is no tenderness. There is no rebound and no guarding.          Assessment & Plan:  UTI, treat with Cipro. Drink fluids. Culture the sample.

## 2015-01-12 LAB — URINE CULTURE: Colony Count: 4000

## 2015-01-13 ENCOUNTER — Other Ambulatory Visit: Payer: Self-pay | Admitting: Family Medicine

## 2015-03-05 NOTE — Progress Notes (Signed)
   Subjective:    Patient ID: Wendy [name], female    DOB: [birth year], 70 y.o.   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    Review of Systems     Objective:   Physical Exam        Assessment & Plan:

## 2015-04-12 ENCOUNTER — Telehealth: Payer: Self-pay | Admitting: Family Medicine

## 2015-04-12 NOTE — Telephone Encounter (Signed)
Pt needs new hydrocodone °

## 2015-04-12 NOTE — Telephone Encounter (Signed)
Ok to refill 

## 2015-04-13 MED ORDER — HYDROCODONE-ACETAMINOPHEN 10-325 MG PO TABS: 1.0000 | ORAL_TABLET | Freq: Four times a day (QID) | ORAL | Status: DC | PRN

## 2015-04-13 NOTE — Telephone Encounter (Signed)
Script is ready for pick up and I spoke with pt.  

## 2015-04-13 NOTE — Telephone Encounter (Signed)
done

## 2015-04-18 ENCOUNTER — Telehealth: Payer: Self-pay | Admitting: Family Medicine

## 2015-04-18 NOTE — Telephone Encounter (Signed)
Pt want to know if she can in  Friday 17 at 3: 30 for UTI pain and get a flu shot

## 2015-04-19 NOTE — Telephone Encounter (Signed)
Yes, that is fine - please schedule patient for that time.

## 2015-04-20 ENCOUNTER — Encounter: Payer: Self-pay | Admitting: Family Medicine

## 2015-04-20 ENCOUNTER — Ambulatory Visit (INDEPENDENT_AMBULATORY_CARE_PROVIDER_SITE_OTHER): Payer: Commercial Managed Care - HMO | Admitting: Family Medicine

## 2015-04-20 VITALS — BP 166/105 | HR 78 | Temp 98.9°F | Ht 64.0 in | Wt 149.0 lb

## 2015-04-20 DIAGNOSIS — N390 Urinary tract infection, site not specified: Secondary | ICD-10-CM | POA: Diagnosis not present

## 2015-04-20 DIAGNOSIS — R109 Unspecified abdominal pain: Secondary | ICD-10-CM

## 2015-04-20 LAB — CBC WITH DIFFERENTIAL/PLATELET
Basophils Absolute: 0 10*3/uL (ref 0.0–0.1)
Basophils Relative: 0 % (ref 0–1)
Eosinophils Absolute: 0.1 10*3/uL (ref 0.0–0.7)
Eosinophils Relative: 1 % (ref 0–5)
HCT: 41.2 % (ref 36.0–46.0)
Hemoglobin: 13.5 g/dL (ref 12.0–15.0)
Lymphocytes Relative: 38 % (ref 12–46)
Lymphs Abs: 2.4 10*3/uL (ref 0.7–4.0)
MCH: 25.8 pg — ABNORMAL LOW (ref 26.0–34.0)
MCHC: 32.8 g/dL (ref 30.0–36.0)
MCV: 78.8 fL (ref 78.0–100.0)
Monocytes Absolute: 1 10*3/uL (ref 0.1–1.0)
Monocytes Relative: 16 % — ABNORMAL HIGH (ref 3–12)
Neutro Abs: 2.8 10*3/uL (ref 1.7–7.7)
Neutrophils Relative %: 45 % (ref 43–77)
Platelets: 237 10*3/uL (ref 150–400)
RBC: 5.23 MIL/uL — ABNORMAL HIGH (ref 3.87–5.11)
RDW: 17.1 % — ABNORMAL HIGH (ref 11.5–15.5)
WBC: 6.3 10*3/uL (ref 4.0–10.5)

## 2015-04-20 LAB — POC URINALSYSI DIPSTICK (AUTOMATED)
Bilirubin, UA: NEGATIVE
Glucose, UA: NEGATIVE
Ketones, UA: NEGATIVE
Leukocytes, UA: NEGATIVE
Nitrite, UA: NEGATIVE
Protein, UA: NEGATIVE
Spec Grav, UA: 1.02
Urobilinogen, UA: 1
pH, UA: 7

## 2015-04-20 MED ORDER — LEVOFLOXACIN 500 MG PO TABS: 500.0000 mg | ORAL_TABLET | Freq: Every day | ORAL | Status: AC

## 2015-04-20 MED ORDER — METRONIDAZOLE 500 MG PO TABS: 500.0000 mg | ORAL_TABLET | Freq: Three times a day (TID) | ORAL | Status: DC

## 2015-04-20 MED ORDER — FUROSEMIDE 20 MG PO TABS: 20.0000 mg | ORAL_TABLET | Freq: Every day | ORAL | Status: DC | PRN

## 2015-04-20 NOTE — Progress Notes (Signed)
Pre visit review using our clinic review tool, if applicable. No additional management support is needed unless otherwise documented below in the visit note. 

## 2015-04-20 NOTE — Progress Notes (Signed)
   Subjective:    Patient ID: Wendy [name], female    DOB: 28-Nov-1945, 70 y.o.   MRN: [id number]  HPI Here for recurrent pains in the LLQ, left flank, and left middle back over the past 2 months. She was here in early November with similar sx and was given Cipro. This helped her symptoms but they never went away. This was felt to be a UTI but the culture was not able to isolate a bacteria. Now this week the symptoms are worse again with pain, urgency to urinate and a sense of fullness. No urinary frequency and no blood. No fever or nausea. Her BMs are regular and soft, and she notes that the pain is relieved for a few hours after she passes a stool.    Review of Systems  Constitutional: Negative.   Respiratory: Negative.   Cardiovascular: Negative.   Gastrointestinal: Positive for abdominal pain. Negative for nausea, vomiting, diarrhea, constipation, blood in stool, abdominal distention, anal bleeding and rectal pain.  Genitourinary: Positive for urgency and flank pain. Negative for dysuria, frequency, hematuria, vaginal bleeding, vaginal discharge and difficulty urinating.  Musculoskeletal: Positive for back pain.       Objective:   Physical Exam  Constitutional: She appears well-developed and well-nourished. No distress.  Cardiovascular: Normal rate, regular rhythm, normal heart sounds and intact distal pulses.   Pulmonary/Chest: Effort normal and breath sounds normal. No respiratory distress. She has no wheezes. She has no rales.  Abdominal: Soft. Bowel sounds are normal. She exhibits no distension and no mass. There is no rebound and no guarding.  Mildly tender in the LLQ and the left flank           Assessment & Plan:  Left flank pain suggestive of diverticulitis. Get labs today including a CBC. Set up a CT scan of the abdomen and pelvis. Treat with Levaquin and Flagyl.

## 2015-04-21 LAB — BASIC METABOLIC PANEL
BUN: 15 mg/dL (ref 7–25)
CO2: 30 mmol/L (ref 20–31)
Calcium: 9 mg/dL (ref 8.6–10.4)
Chloride: 104 mmol/L (ref 98–110)
Creat: 0.73 mg/dL (ref 0.50–0.99)
Glucose, Bld: 89 mg/dL (ref 65–99)
Potassium: 3.9 mmol/L (ref 3.5–5.3)
Sodium: 140 mmol/L (ref 135–146)

## 2015-04-21 LAB — HEPATIC FUNCTION PANEL
ALT: 11 U/L (ref 6–29)
AST: 14 U/L (ref 10–35)
Albumin: 3.8 g/dL (ref 3.6–5.1)
Alkaline Phosphatase: 84 U/L (ref 33–130)
Bilirubin, Direct: 0.2 mg/dL (ref <=0.2)
Indirect Bilirubin: 0.6 mg/dL (ref 0.2–1.2)
Total Bilirubin: 0.8 mg/dL (ref 0.2–1.2)
Total Protein: 7.1 g/dL (ref 6.1–8.1)

## 2015-04-21 LAB — LIPASE: Lipase: 39 U/L (ref 7–60)

## 2015-04-22 LAB — URINE CULTURE
Colony Count: NO GROWTH
Organism ID, Bacteria: NO GROWTH

## 2015-04-25 ENCOUNTER — Ambulatory Visit (INDEPENDENT_AMBULATORY_CARE_PROVIDER_SITE_OTHER)
Admission: RE | Admit: 2015-04-25 | Discharge: 2015-04-25 | Disposition: A | Discharge status: Home / Self Care | Payer: Commercial Managed Care - HMO | Source: Ambulatory Visit | Attending: Family Medicine | Admitting: Family Medicine

## 2015-04-25 DIAGNOSIS — R109 Unspecified abdominal pain: Secondary | ICD-10-CM | POA: Diagnosis not present

## 2015-04-25 MED ORDER — IOHEXOL 300 MG/ML  SOLN
100.0000 mL | Freq: Once | INTRAMUSCULAR | Status: AC | PRN
  Administered 2015-04-25: 100 mL via INTRAVENOUS

## 2015-04-29 ENCOUNTER — Other Ambulatory Visit: Payer: Self-pay | Admitting: Family Medicine

## 2015-05-07 ENCOUNTER — Telehealth: Payer: Self-pay | Admitting: Family Medicine

## 2015-05-07 NOTE — Telephone Encounter (Signed)
Pt states from results of CT scan, dr Sarajane Jews advised pt to see an oncologist. Pt would like a referral.

## 2015-05-07 NOTE — Telephone Encounter (Signed)
I spoke with Wendy [name], per Dr. Sarajane Jews Wendy [name] should contact her GYN due to recent CT results, not oncology.

## 2015-05-14 ENCOUNTER — Other Ambulatory Visit: Payer: Self-pay | Admitting: Family Medicine

## 2015-05-15 MED ORDER — ALPRAZOLAM 0.5 MG PO TABS: 0.5000 mg | ORAL_TABLET | Freq: Every evening | ORAL | Status: DC | PRN

## 2015-05-15 NOTE — Telephone Encounter (Signed)
I called in script 

## 2015-05-15 NOTE — Telephone Encounter (Signed)
Call in #30 with 5 rf 

## 2015-05-22 DIAGNOSIS — Z0100 Encounter for examination of eyes and vision without abnormal findings: Secondary | ICD-10-CM | POA: Diagnosis not present

## 2015-05-22 DIAGNOSIS — H2513 Age-related nuclear cataract, bilateral: Secondary | ICD-10-CM | POA: Diagnosis not present

## 2015-05-22 DIAGNOSIS — E119 Type 2 diabetes mellitus without complications: Secondary | ICD-10-CM | POA: Diagnosis not present

## 2015-05-22 LAB — HM DIABETES EYE EXAM

## 2015-06-01 DIAGNOSIS — H5203 Hypermetropia, bilateral: Secondary | ICD-10-CM | POA: Diagnosis not present

## 2015-06-01 DIAGNOSIS — E119 Type 2 diabetes mellitus without complications: Secondary | ICD-10-CM | POA: Diagnosis not present

## 2015-06-05 ENCOUNTER — Telehealth: Payer: Self-pay | Admitting: Family Medicine

## 2015-06-05 NOTE — Telephone Encounter (Signed)
Pt would like to see if Dr. Sarajane Jews would order her a new heating pad.

## 2015-06-06 NOTE — Telephone Encounter (Signed)
rx is ready to fax  

## 2015-06-06 NOTE — Telephone Encounter (Signed)
I spoke with pt and she said that you have wrote for this in the past, her insurance will cover if its written on a script. Pt has back and shoulder pain and that's the reason for order. Can we please fax the [facility] to [facility] and let pt know if we can help with this.

## 2015-06-06 NOTE — Telephone Encounter (Signed)
Script was faxed and I spoke with pt.

## 2015-06-20 DIAGNOSIS — H25812 Combined forms of age-related cataract, left eye: Secondary | ICD-10-CM | POA: Diagnosis not present

## 2015-06-20 DIAGNOSIS — H2512 Age-related nuclear cataract, left eye: Secondary | ICD-10-CM | POA: Diagnosis not present

## 2015-07-02 DIAGNOSIS — R102 Pelvic and perineal pain: Secondary | ICD-10-CM | POA: Diagnosis not present

## 2015-07-02 DIAGNOSIS — D251 Intramural leiomyoma of uterus: Secondary | ICD-10-CM | POA: Diagnosis not present

## 2015-07-19 ENCOUNTER — Telehealth: Payer: Self-pay | Admitting: Family Medicine

## 2015-07-19 NOTE — Telephone Encounter (Signed)
Pt needs new rx hydrocodone °

## 2015-07-20 MED ORDER — HYDROCODONE-ACETAMINOPHEN 10-325 MG PO TABS: 1.0000 | ORAL_TABLET | Freq: Four times a day (QID) | ORAL | Status: DC | PRN

## 2015-07-20 NOTE — Telephone Encounter (Signed)
done

## 2015-07-20 NOTE — Telephone Encounter (Signed)
Script is ready for pick up and I left a voice message for pt. 

## 2015-07-23 DIAGNOSIS — R102 Pelvic and perineal pain: Secondary | ICD-10-CM | POA: Diagnosis not present

## 2015-07-23 DIAGNOSIS — D259 Leiomyoma of uterus, unspecified: Secondary | ICD-10-CM | POA: Diagnosis not present

## 2015-07-31 ENCOUNTER — Other Ambulatory Visit: Payer: Self-pay | Admitting: Family Medicine

## 2015-08-01 DIAGNOSIS — H2511 Age-related nuclear cataract, right eye: Secondary | ICD-10-CM | POA: Diagnosis not present

## 2015-08-01 DIAGNOSIS — H25811 Combined forms of age-related cataract, right eye: Secondary | ICD-10-CM | POA: Diagnosis not present

## 2015-08-06 ENCOUNTER — Other Ambulatory Visit: Payer: Self-pay | Admitting: Family Medicine

## 2015-08-22 ENCOUNTER — Other Ambulatory Visit: Payer: Self-pay | Admitting: Obstetrics and Gynecology

## 2015-08-22 DIAGNOSIS — R938 Abnormal findings on diagnostic imaging of other specified body structures: Secondary | ICD-10-CM | POA: Diagnosis not present

## 2015-08-22 DIAGNOSIS — N841 Polyp of cervix uteri: Secondary | ICD-10-CM | POA: Diagnosis not present

## 2015-10-16 ENCOUNTER — Telehealth: Payer: Self-pay | Admitting: Family Medicine

## 2015-10-16 NOTE — Telephone Encounter (Signed)
Patient aware Dr. Sarajane Jews is out until Thursday.

## 2015-10-16 NOTE — Telephone Encounter (Signed)
Pt request refill  °HYDROcodone-acetaminophen (NORCO) 10-325 MG tablet °

## 2015-10-18 MED ORDER — HYDROCODONE-ACETAMINOPHEN 10-325 MG PO TABS: 1.0000 | ORAL_TABLET | Freq: Four times a day (QID) | ORAL | 0 refills | Status: DC | PRN

## 2015-10-18 NOTE — Telephone Encounter (Signed)
done

## 2015-10-18 NOTE — Telephone Encounter (Signed)
Scripts are ready for pick up here at front office and I spoke with pt. 

## 2015-11-13 DIAGNOSIS — B182 Chronic viral hepatitis C: Secondary | ICD-10-CM | POA: Diagnosis not present

## 2015-11-27 ENCOUNTER — Other Ambulatory Visit: Payer: Self-pay | Admitting: Family Medicine

## 2016-01-07 ENCOUNTER — Ambulatory Visit (INDEPENDENT_AMBULATORY_CARE_PROVIDER_SITE_OTHER): Payer: Commercial Managed Care - HMO

## 2016-01-07 DIAGNOSIS — Z23 Encounter for immunization: Secondary | ICD-10-CM | POA: Diagnosis not present

## 2016-01-21 ENCOUNTER — Telehealth: Payer: Self-pay | Admitting: Family Medicine

## 2016-01-21 MED ORDER — HYDROCODONE-ACETAMINOPHEN 10-325 MG PO TABS: 1.0000 | ORAL_TABLET | Freq: Four times a day (QID) | ORAL | 0 refills | Status: DC | PRN

## 2016-01-21 NOTE — Telephone Encounter (Signed)
Pt request refill  °HYDROcodone-acetaminophen (NORCO) 10-325 MG tablet °

## 2016-01-21 NOTE — Telephone Encounter (Signed)
done

## 2016-01-22 NOTE — Telephone Encounter (Signed)
Script is ready for pick up here at front office and I spoke with pt.  

## 2016-02-04 ENCOUNTER — Telehealth: Payer: Self-pay | Admitting: Family Medicine

## 2016-02-04 NOTE — Telephone Encounter (Signed)
Pt request refill  potassium chloride (K-DUR,KLOR-CON) 10 MEQ tablet  [facility] [address]

## 2016-02-04 NOTE — Telephone Encounter (Signed)
Can we refill this? 

## 2016-02-05 MED ORDER — POTASSIUM CHLORIDE CRYS ER 10 MEQ PO TBCR: 10.0000 meq | EXTENDED_RELEASE_TABLET | Freq: Two times a day (BID) | ORAL | 3 refills | Status: DC

## 2016-02-05 NOTE — Telephone Encounter (Signed)
done

## 2016-02-13 ENCOUNTER — Telehealth: Payer: Self-pay | Admitting: Family Medicine

## 2016-02-13 NOTE — Telephone Encounter (Signed)
Pharmacy called for pt to request a refill of ALPRAZolam (XANAX) 0.5 MG tablet  30 day / w refills

## 2016-02-13 NOTE — Telephone Encounter (Signed)
Call in #30 with 5 rf 

## 2016-02-14 MED ORDER — ALPRAZOLAM 0.5 MG PO TABS: 0.5000 mg | ORAL_TABLET | Freq: Every evening | ORAL | 5 refills | Status: DC | PRN

## 2016-02-14 NOTE — Telephone Encounter (Signed)
I [facility] 

## 2016-02-20 ENCOUNTER — Other Ambulatory Visit: Payer: Self-pay | Admitting: Family Medicine

## 2016-02-20 NOTE — Telephone Encounter (Signed)
No, this should come from her rheumatologist Dr. Trudie Reed

## 2016-02-21 ENCOUNTER — Telehealth: Payer: Self-pay

## 2016-02-21 NOTE — Telephone Encounter (Signed)
Call to schedule AWV Agreed to schedule the 1st of the year; Elected to call back but will call the office and schedule with Manuela Schwartz

## 2016-04-10 ENCOUNTER — Telehealth: Payer: Self-pay

## 2016-04-10 NOTE — Telephone Encounter (Signed)
Call to ms Wendy [name]. Stated she needed a physical. Could come in on the 16th. Scheduled with Dr. Sarajane Jews on 2/16 at 2:30 and scheduled for AWV post Dr. Barbie Banner visit

## 2016-04-18 ENCOUNTER — Encounter: Payer: Self-pay | Admitting: Family Medicine

## 2016-04-18 ENCOUNTER — Ambulatory Visit (INDEPENDENT_AMBULATORY_CARE_PROVIDER_SITE_OTHER): Payer: Medicare HMO | Admitting: Family Medicine

## 2016-04-18 ENCOUNTER — Encounter: Payer: Commercial Managed Care - HMO | Admitting: Family Medicine

## 2016-04-18 VITALS — BP 144/87 | HR 72 | Temp 98.3°F | Ht 64.0 in | Wt 161.0 lb

## 2016-04-18 DIAGNOSIS — E42 Marasmic kwashiorkor: Secondary | ICD-10-CM

## 2016-04-18 DIAGNOSIS — K219 Gastro-esophageal reflux disease without esophagitis: Secondary | ICD-10-CM | POA: Diagnosis not present

## 2016-04-18 DIAGNOSIS — E119 Type 2 diabetes mellitus without complications: Secondary | ICD-10-CM | POA: Diagnosis not present

## 2016-04-18 DIAGNOSIS — Z0000 Encounter for general adult medical examination without abnormal findings: Secondary | ICD-10-CM | POA: Diagnosis not present

## 2016-04-18 DIAGNOSIS — M5440 Lumbago with sciatica, unspecified side: Secondary | ICD-10-CM | POA: Diagnosis not present

## 2016-04-18 DIAGNOSIS — I10 Essential (primary) hypertension: Secondary | ICD-10-CM | POA: Diagnosis not present

## 2016-04-18 LAB — CBC WITH DIFFERENTIAL/PLATELET
Basophils Absolute: 53 cells/uL (ref 0–200)
Basophils Relative: 1 %
Eosinophils Absolute: 106 cells/uL (ref 15–500)
Eosinophils Relative: 2 %
HCT: 38.5 % (ref 35.0–45.0)
Hemoglobin: 12.5 g/dL (ref 11.7–15.5)
Lymphocytes Relative: 45 %
Lymphs Abs: 2385 cells/uL (ref 850–3900)
MCH: 26.1 pg — ABNORMAL LOW (ref 27.0–33.0)
MCHC: 32.5 g/dL (ref 32.0–36.0)
MCV: 80.4 fL (ref 80.0–100.0)
MPV: 11.9 fL (ref 7.5–12.5)
Monocytes Absolute: 689 cells/uL (ref 200–950)
Monocytes Relative: 13 %
Neutro Abs: 2067 cells/uL (ref 1500–7800)
Neutrophils Relative %: 39 %
Platelets: 213 10*3/uL (ref 140–400)
RBC: 4.79 MIL/uL (ref 3.80–5.10)
RDW: 15.7 % — ABNORMAL HIGH (ref 11.0–15.0)
WBC: 5.3 10*3/uL (ref 3.8–10.8)

## 2016-04-18 LAB — POC URINALSYSI DIPSTICK (AUTOMATED)
Bilirubin, UA: NEGATIVE
Glucose, UA: NEGATIVE
Leukocytes, UA: NEGATIVE
Nitrite, UA: NEGATIVE
Protein, UA: NEGATIVE
Spec Grav, UA: 1.025
Urobilinogen, UA: 1
pH, UA: 6

## 2016-04-18 LAB — TSH: TSH: 0.67 mIU/L

## 2016-04-18 MED ORDER — HYDROCODONE-ACETAMINOPHEN 10-325 MG PO TABS: 1.0000 | ORAL_TABLET | Freq: Four times a day (QID) | ORAL | 0 refills | Status: DC | PRN

## 2016-04-18 MED ORDER — GLIPIZIDE 5 MG PO TABS: 5.0000 mg | ORAL_TABLET | Freq: Two times a day (BID) | ORAL | 11 refills | Status: DC

## 2016-04-18 MED ORDER — PROMETHAZINE HCL 25 MG PO TABS
25.0000 mg | ORAL_TABLET | ORAL | 5 refills | Status: DC | PRN
Start: 2016-04-18 — End: 2016-09-19

## 2016-04-18 NOTE — Progress Notes (Signed)
Subjective:   Wendy [name] is a 71 y.o. female who presents for Medicare Annual (Subsequent) preventive examination.  HRA assessment completed during this visit with Wendy [name]   The Patient was informed that the wellness visit is to identify future health risk and educate and initiate measures that can reduce risk for increased disease through the lifespan.    NO ROS; Medicare Wellness Visit Last OV:  09/2015 Labs completed: 04/2015  Describes health as fair, good or great? Good   Lives w friend Raising grand-baby; she is 18 yo   Update:  Tobacco: no or smoking cessation discussed:   How many drinks do you have per week? occasional  Medications In July 2017, started taking metformin 500 once a day; dc Januvia and taking glipizide as prescribed after losing 30 lbs  Stopped metformin due to irritating her stomach Takes glipizide now but   Dr. Sarajane Jews reduced today to 1/2 Patient voices understanding    BMI: 27 today 190 in 2012  149 in 04/2015 at 25 BMI   Diet;  Changed eating habits Snack a lot; fast foods  Eats vegetables and fruits Eats fruit instead of sweets  Exercise;  works and walks a lot Doesn't like getting out in the sun Can join the silver sne[employer]' will consider joining as he likes these classes  Uses weights at home at intervals for strength building     HOME SAFETY;  Free standing home  Fall hx; no climbing  Gait: walking;  Given education on "Fall Prevention in the Home" for more safety tips the patient can apply as appropriate.  Long term goal is to "age in place" or undecided   Safety features reviewed for safe community; Keep firearms in a safe place if in the home;  smoke alarms; yes sun protection when outside (not in the sun);  driving difficulties or accidents / no   Mental Health:  Any emotional problems? Anxious, depressed, irritable, sad or blue? no Denies feeling depressed or hopeless; voices pleasure in daily life How many social  activities have you been engaged in within the last 2 weeks? no   Pain: states she was in pain and took cortisone shots and bs elevated is when she was dx with DM Back surgery; No c/o today  Cognitive;  Manages checkbook, medications; no failures of task Ad8 score reviewed for issues;  Issues making decisions; no  Less interest in hobbies / activities" no  Repeats questions, stories; family complaining: NO  Trouble using ordinary gadgets; microwave; computer: no  Forgets the month or year: no  Mismanaging finances: no  Missing apt: no but does write them down  Daily problems with thinking of memory NO Ad8 score is 0    Mobilization and Functional losses from last year to this year? no   Hearing 4000hz  both ears  Ophthalmology exam completed 05/2015 and due 05/2016 [facility]   Advanced Directive addressed; no but dtr knows what she wants. Offered [place] form but declines for now    Colonoscopy;  Was due 03/2008; [facility]) will have the next one done here; report in chart and discussed with Dr. Sarajane Jews; states the one in 2010 was normal  Repeat due 2020 per the patient States she discussed with Dr. Sarajane Jews   Mammogram: 04/2016/ didn't go last year; but will have one thiis year   Foot exam; Decline today but reviewed good foot hygiene and care   Dexa/ 03/2013 -0.4/ was told she was normal  Discussed  having one repeated in 3 to 5 years;  PAP: educated regarding the need for GYN exam;  Prostate cancer screening:  Tdap - declines but will take if she incurs an injury Shingles / never had chicken pox; declined today and will postpone for 1 year due to the 2018 vaccines coming out that are not live     Cardiac Risk Factors include: advanced age (>24men, >76 women);diabetes mellitus;dyslipidemia;hypertension;smoking/ tobacco exposure     Objective:     Vitals: BP (!) 144/87   Pulse 72   Temp 98.3 F (36.8 C)   Ht 5\' 4"  (1.626 m)   Wt 161 lb (73 kg)    BMI 27.64 kg/m   Body mass index is 27.64 kg/m.   Tobacco History  Smoking Status  . Current Some Day Smoker  . Packs/day: 0.03  . Years: 45.00  . Types: Cigarettes  Smokeless Tobacco  . Never Used    Comment: still smokes      Ready to quit: No Counseling given: Yes   Past Medical History:  Diagnosis Date  . DEPENDENT EDEMA 07/21/2007  . DIABETES MELLITUS, TYPE II 11/05/2006  . FOOT PAIN 05/23/2009  . GERD 11/20/2006  . HEPATITIS C 02/08/2007   sees GI @ [place] 11/05/2006  . LOW BACK PAIN SYNDROME 07/21/2007  . 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 Rheumatoid arthritis(714.0) 11/05/2006   sees Dr. Gavin Pound    Past Surgical History:  Procedure Laterality Date  . COLONOSCOPY  06-20-08   at [facility], clear   . ESOPHAGOGASTRODUODENOSCOPY  06-20-08   at [facility], clear   . LUMBAR LAMINECTOMY     1995   Family History  Problem Relation Age of Onset  . Arthritis    . Diabetes    . Hypertension    . Cancer      lung   History  Sexual Activity  . Sexual activity: Not on file    Outpatient Encounter Prescriptions as of 04/18/2016  Medication Sig  . ACCU-CHEK AVIVA PLUS test strip TEST ONCE PER DAY   . ALPRAZolam (XANAX) 0.5 MG tablet Take 1 tablet (0.5 mg total) by mouth at bedtime as needed for sleep.  [name] CALCIUM-VITAMIN D PO Take 1 tablet by mouth daily.  . cetirizine (ZYRTEC) 10 MG tablet Take 1 tablet (10 mg total) by mouth daily.  . cyclobenzaprine (FLEXERIL) 10 MG tablet Take 1 tablet (10 mg total) by mouth 3 (three) times daily as needed for muscle spasms.  . fluticasone (FLONASE) 50 MCG/ACT nasal spray PLACE 2 SPRAYS INTO BOTH NOSTRILS DAILY.  . furosemide (LASIX) 20 MG tablet Take 1 tablet (20 mg total) by mouth daily as needed (fluid retention).  . gabapentin (NEURONTIN) 100 MG capsule Take 1 capsule (100 mg total) by mouth at bedtime.  [name] HYDROcodone-acetaminophen (NORCO) 10-325 MG tablet Take 1 tablet by mouth every 6  (six) hours as needed for moderate pain.  . hydroxychloroquine (PLAQUENIL) 200 MG tablet TAKE 1 TABLET (200 MG TOTAL) BY MOUTH DAILY.  [name] Lancets (ACCU-CHEK SOFT TOUCH) lancets Test once per day and diagnosis code is E 11.9 and dispense for Aviva plus  . lisinopril-hydrochlorothiazide (PRINZIDE,ZESTORETIC) 20-25 MG tablet TAKE 1 TABLET EVERY DAY  . omeprazole (PRILOSEC) 40 MG capsule Take 1 capsule (40 mg total) by mouth daily.  . potassium chloride (K-DUR,KLOR-CON) 10 MEQ tablet Take 1 tablet (10 mEq total) by mouth 2 (two) times daily. (Patient taking differently: Take 10 mEq by  mouth daily. )  . promethazine (PHENERGAN) 25 MG tablet Take 1 tablet (25 mg total) by mouth every 4 (four) hours as needed for nausea or vomiting.  . [DISCONTINUED] glipiZIDE (GLUCOTROL) 10 MG tablet TAKE 1 TABLET (10 MG TOTAL) BY MOUTH 2 (TWO) TIMES DAILY BEFORE A MEAL.  . [DISCONTINUED] HYDROcodone-acetaminophen (NORCO) 10-325 MG tablet Take 1 tablet by mouth every 6 (six) hours as needed for moderate pain.  . [DISCONTINUED] HYDROcodone-acetaminophen (NORCO) 10-325 MG tablet Take 1 tablet by mouth every 6 (six) hours as needed for moderate pain.  . [DISCONTINUED] HYDROcodone-acetaminophen (NORCO) 10-325 MG tablet Take 1 tablet by mouth every 6 (six) hours as needed for moderate pain.  . [DISCONTINUED] promethazine (PHENERGAN) 25 MG tablet Take 1 tablet (25 mg total) by mouth every 4 (four) hours as needed for nausea or vomiting.  [name] glipiZIDE (GLUCOTROL) 5 MG tablet Take 1 tablet (5 mg total) by mouth 2 (two) times daily before a meal.  . [DISCONTINUED] metroNIDAZOLE (FLAGYL) 500 MG tablet Take 1 tablet (500 mg total) by mouth 3 (three) times daily. (Patient not taking: Reported on 04/18/2016)  . [DISCONTINUED] sitaGLIPtin (JANUVIA) 100 MG tablet Take 1 tablet (100 mg total) by mouth daily. (Patient not taking: Reported on 09/26/2014)   No facility-administered encounter medications on file as of 04/18/2016.     Activities  of Daily Living In your present state of health, do you have any difficulty performing the following activities: 04/18/2016  Hearing? N  Vision? N  Difficulty concentrating or making decisions? N  Walking or climbing stairs? N  Dressing or bathing? N  Doing errands, shopping? N  Preparing Food and eating ? N  In the past six months, have you accidently leaked urine? N  Do you have problems with loss of bowel control? N  Managing your Medications? N  Managing your Finances? N  Housekeeping or managing your Housekeeping? N  Some recent data might be hidden    Patient Care Team: Laurey Morale, MD as PCP - General    Assessment:     Exercise Activities and Dietary recommendations Current Exercise Habits: Home exercise routine, Type of exercise: walking, Time (Minutes): 30, Frequency (Times/Week): 5, Weekly Exercise (Minutes/Week): 150, Intensity: Mild  Goals    . Quit smoking / using tobacco          Smoking;  Educated to avoid secondary smoke Smoking cessation in [place]: [place] at [phone] -day Smoking cessation in [place]: Evening; [phone] Smoking cessation at [facility]: [id number] Smoking cessation at [facility]: [id number] Classes offered a couple of times a month;  Will work with the patient as far as registration and location  Meds may help; chatix (Varenicline); Zyban (Bupropion SR); Nicotine Replacement (gum; lozenges; patches; etc.)   30 pack yr smoking hx: Educated regarding LDCT; To discuss with MD at next fup. Also educated on AAA screening for men 65-75 who have smoked  [place] Quit; line; 1-800-QUIT-NOW [phone])     . Weight (lb) < 148 lb (67.1 kg)          Cut back on breads;  Eat more lean meat; chicken; fish; tuna Will cut back on fruits and more vegetables   Fat free or low fat dairy products Fish high in omega-3 acids ( salmon, tuna, trout) Fruits, such as apples, bananas, oranges, pears, prunes Legumes, such as kidney beans,  lentils, checkpeas, black-eyed peas and lima beans Vegetables; broccoli, cabbage, carrots Whole grains;   Plant fats are better; decrease "white" foods as  pasta, rice, bread and desserts, sugar; Avoid red meat (limiting) palm and coconut oils; sugary foods and beverages  Two nutrients that raise blood chol levels are saturated fats and trans fat; in hydrogenated oils and fats, as stick margarine, baked goods (cookes, cakes, pies, crackers; frosting; and coffee creamers;   Some Fats lower cholesterol: Monounsaturated and polyunsaturated  Avocados Corn, sunflower, and soybean oils Nuts and seeds, such as walnuts Olive, canola, peanut, safflower, and sesame oils Peanut butter Salmon and trout Tofu       . Weight (lb) < 200 lb (90.7 kg)      Fall Risk Fall Risk  04/18/2016 04/20/2015  Falls in the past year? No No   Depression Screen PHQ 2/9 Scores 04/18/2016 04/20/2015  PHQ - 2 Score 0 0     Cognitive Function MMSE - Mini Mental State Exam 04/18/2016  Not completed: (No Data)    AD8 score is 0     Immunization History  Administered Date(s) Administered  . Influenza Whole 02/08/2007, 12/01/2008  . Influenza, High Dose Seasonal PF 01/07/2016  . Influenza,inj,Quad PF,36+ Mos 12/21/2012, 10/26/2013  . Pneumococcal Conjugate-13 01/07/2016   Screening Tests Health Maintenance  Topic Date Due  . HEMOGLOBIN A1C  03/29/2015  . MAMMOGRAM  04/14/2016  . FOOT EXAM  04/17/2017 (Originally 10/27/2014)  . ZOSTAVAX  04/17/2017 (Originally 01/08/2007)  . TETANUS/TDAP  04/17/2017 (Originally 01/07/1966)  . COLONOSCOPY  03/03/2018 (Originally 03/03/2012)  . OPHTHALMOLOGY EXAM  05/21/2016  . PNA vac Low Risk Adult (2 of 2 - PPSV23) 01/06/2017  . INFLUENZA VACCINE  Completed  . DEXA SCAN  Completed  . Hepatitis C Screening  Completed      Plan:   Will schedule an apt for mammogram soon; last exam 04/2014  Educated on Tscore and calcium and Vit D Takes womens' multi-vit May repeat dexa in  5 years for comparison  Will take tdap if she sustains a cut or injury   During the course of the visit the patient was educated and counseled about the following appropriate screening and preventive services:   Vaccines to include Pneumoccal, Influenza, Hepatitis B, Td, Zostavax, HCV  Electrocardiogram  Cardiovascular Disease  Colorectal cancer screening states this is due 2020   Bone density screening may repeat if 5 years; 2020  Diabetes screening; continues to monitor weight; well controled  Glaucoma screening postpone eye exam but will have one this year  Mammography/ will schedule this year  Nutrition counseling dicsussed  Smoking cessation discussed; limited smoking   Patient Instructions (the written plan) was given to the patient.   [id number], RN  04/18/2016   I have read this note and agree with its contents  [name], MD

## 2016-04-18 NOTE — Progress Notes (Signed)
   Subjective:    Patient ID: Wendy [name], female    DOB: [birth year], 71 y.o.   MRN: [id number]  HPI 71 yr old female to follow up. She feels well. She asks if her Glipizide dose can be decreased because sometimes her glucoses drop into the 60s. Her BP has been stable.    Review of Systems  Constitutional: Negative.   HENT: Negative.   Eyes: Negative.   Respiratory: Negative.   Cardiovascular: Negative.   Gastrointestinal: Negative.   Genitourinary: Negative for decreased urine volume, difficulty urinating, dyspareunia, dysuria, enuresis, flank pain, frequency, hematuria, pelvic pain and urgency.  Musculoskeletal: Negative.   Skin: Negative.   Neurological: Negative.   Psychiatric/Behavioral: Negative.        Objective:   Physical Exam  Constitutional: She is oriented to person, place, and time. She appears well-developed and well-nourished. No distress.  HENT:  Head: Normocephalic and atraumatic.  Right Ear: External ear normal.  Left Ear: External ear normal.  Nose: Nose normal.  Mouth/Throat: Oropharynx is clear and moist. No oropharyngeal exudate.  Eyes: Conjunctivae and EOM are normal. Pupils are equal, round, and reactive to light. No scleral icterus.  Neck: Normal range of motion. Neck supple. No JVD present. No thyromegaly present.  Cardiovascular: Normal rate, regular rhythm, normal heart sounds and intact distal pulses.  Exam reveals no gallop and no friction rub.   No murmur heard. Pulmonary/Chest: Effort normal and breath sounds normal. No respiratory distress. She has no wheezes. She has no rales. She exhibits no tenderness.  Abdominal: Soft. Bowel sounds are normal. She exhibits no distension and no mass. There is no tenderness. There is no rebound and no guarding.  Musculoskeletal: Normal range of motion. She exhibits no edema or tenderness.  Lymphadenopathy:    She has no cervical adenopathy.  Neurological: She is alert and oriented to person, place, and  time. She has normal reflexes. No cranial nerve deficit. She exhibits normal muscle tone. Coordination normal.  Skin: Skin is warm and dry. No rash noted. No erythema.  Psychiatric: She has a normal mood and affect. Her behavior is normal. Judgment and thought content normal.          Assessment & Plan:  Her HTN is stable. Her low back pain is stable. Get fasting labs today to follow the lipids and the diabetes. Decrease the Glipizide to 5 mg bid.  [name], MD

## 2016-04-18 NOTE — Progress Notes (Signed)
Pre visit review using our clinic review tool, if applicable. No additional management support is needed unless otherwise documented below in the visit note. 

## 2016-04-18 NOTE — Patient Instructions (Addendum)
[name] , Thank you for taking time to come for your Medicare Wellness Visit. I appreciate your ongoing commitment to your health goals. Please review the following plan we discussed and let me know if I can assist you in the future.   Will schedule an apt for mammogram soon; last exam 04/2014  DEXA may want to repeat in 5 years to check against the one 2015;  Recommend lots of walking for weight bearing Also recommended Calcium 1253m and vit D 1000 u per day   A Tetanus is recommended every 10 years. Medicare covers a tetanus if you have a cut or wound; otherwise, there may be a charge. If you had not had a tetanus with pertusses, known as the Tdap, you can take this anytime.      These are the goals we discussed: Goals    . Quit smoking / using tobacco          Smoking;  Educated to avoid secondary smoke Smoking cessation in [place]: [place] [id number] Smoking cessation in [place]: Evening; [id number] [phone] [phone] offered a couple of times a month;  Will work with the patient as far as registration and location  Meds may help; chatix (Varenicline); Zyban (Bupropion SR); Nicotine Replacement (gum; lozenges; patches; etc.)   30 pack yr smoking hx: Educated regarding LDCT; To discuss with MD at next fup. Also educated on AAA screening for men 65-75 who have smoked  [employer] [place] [employer] Quit; line; 1-800-QUIT-NOW (717-150-3230     . Weight (lb) < 148 lb (67.1 kg)          Cut back on breads;  Eat more lean meat; chicken; fish; tuna Will cut back on fruits and more vegetables   Fat free or low fat dairy products Fish high in omega-3 acids ( salmon, tuna, trout) Fruits, such as apples, bananas, oranges, pears, prunes Legumes, such as kidney beans, lentils, checkpeas, black-eyed peas and lima beans Vegetables; broccoli, cabbage, carrots Whole grains;   Plant fats are better;  decrease "white" foods as pasta, rice, bread and desserts, sugar; Avoid red meat (limiting) palm and coconut oils; sugary foods and beverages  Two nutrients that raise blood chol levels are saturated fats and trans fat; in hydrogenated oils and fats, as stick margarine, baked goods (cookes, cakes, pies, crackers; frosting; and coffee creamers;   Some Fats lower cholesterol: Monounsaturated and polyunsaturated  Avocados Corn, sunflower, and soybean oils Nuts and seeds, such as walnuts Olive, canola, peanut, safflower, and sesame oils Peanut butter Salmon and trout Tofu       . Weight (lb) < 200 lb (90.7 kg)       This is a list of the screening recommended for you and due dates:  Health Maintenance  Topic Date Due  . Tetanus Vaccine  01/07/1966  . Shingles Vaccine  01/08/2007  . Colon Cancer Screening  03/03/2012  . Complete foot exam   10/27/2014  . Hemoglobin A1C  03/29/2015  . Mammogram  04/14/2016  . Eye exam for diabetics  05/21/2016  . Pneumonia vaccines (2 of 2 - PPSV23) 01/06/2017  . Flu Shot  Completed  . DEXA scan (bone density measurement)  Completed  .  Hepatitis C: One time screening is recommended by Center for Disease Control  (CDC) for  adults born from [birth year]through 1964.   Completed   Recommendations for Dexa Scan Female over the age of 653Man age 70  or older If you broke a bone past the age of 75 Women menopausal age with risk factors (thin frame; smoker; hx of fx ) Post menopausal women under the age of 35 with risk factors A man age 66 to 62 with risk factors Other: Spine xray that is showing break of bone loss Back pain with possible break Height loss of 1/2 inch or more within one year Total loss in height of 1.5 inches from your original height  Calcium '1200mg'$  with Vit D 800u per day; more as directed by physician Strength building exercises discussed; can include walking; housework; small weights or stretch bands; silver sneakers if access to the  Y  Fall Prevention in the Home Falls can cause injuries and can affect people from all age groups. There are many simple things that you can do to make your home safe and to help prevent falls. What can I do on the outside of my home?  Regularly repair the edges of walkways and driveways and fix any cracks.  Remove high doorway thresholds.  Trim any shrubbery on the main path into your home.  Use bright outdoor lighting.  Clear walkways of debris and clutter, including tools and rocks.  Regularly check that handrails are securely fastened and in good repair. Both sides of any steps should have handrails.  Install guardrails along the edges of any raised decks or porches.  Have leaves, snow, and ice cleared regularly.  Use sand or salt on walkways during winter months.  In the garage, clean up any spills right away, including grease or oil spills. What can I do in the bathroom?  Use night lights.  Install grab bars by the toilet and in the tub and shower. Do not use towel bars as grab bars.  Use non-skid mats or decals on the floor of the tub or shower.  If you need to sit down while you are in the shower, use a plastic, non-slip stool.  Keep the floor dry. Immediately clean up any water that spills on the floor.  Remove soap buildup in the tub or shower on a regular basis.  Attach bath mats securely with double-sided non-slip rug tape.  Remove throw rugs and other tripping hazards from the floor. What can I do in the bedroom?  Use night lights.  Make sure that a bedside light is easy to reach.  Do not use oversized bedding that drapes onto the floor.  Have a firm chair that has side arms to use for getting dressed.  Remove throw rugs and other tripping hazards from the floor. What can I do in the kitchen?  Clean up any spills right away.  Avoid walking on wet floors.  Place frequently used items in easy-to-reach places.  If you need to reach for something  above you, use a sturdy step stool that has a grab bar.  Keep electrical cables out of the way.  Do not use floor polish or wax that makes floors slippery. If you have to use wax, make sure that it is non-skid floor wax.  Remove throw rugs and other tripping hazards from the floor. What can I do in the stairways?  Do not leave any items on the stairs.  Make sure that there are handrails on both sides of the stairs. Fix handrails that are broken or loose. Make sure that handrails are as long as the stairways.  Check any carpeting to make sure that it is firmly attached to the stairs. Fix  any carpet that is loose or worn.  Avoid having throw rugs at the top or bottom of stairways, or secure the rugs with carpet tape to prevent them from moving.  Make sure that you have a light switch at the top of the stairs and the bottom of the stairs. If you do not have them, have them installed. What are some other fall prevention tips?  Wear closed-toe shoes that fit well and support your feet. Wear shoes that have rubber soles or low heels.  When you use a stepladder, make sure that it is completely opened and that the sides are firmly locked. Have someone hold the ladder while you are using it. Do not climb a closed stepladder.  Add color or contrast paint or tape to grab bars and handrails in your home. Place contrasting color strips on the first and last steps.  Use mobility aids as needed, such as canes, walkers, scooters, and crutches.  Turn on lights if it is dark. Replace any light bulbs that burn out.  Set up furniture so that there are clear paths. Keep the furniture in the same spot.  Fix any uneven floor surfaces.  Choose a carpet design that does not hide the edge of steps of a stairway.  Be aware of any and all pets.  Review your medicines with your healthcare provider. Some medicines can cause dizziness or changes in blood pressure, which increase your risk of falling. Talk  with your health care provider about other ways that you can decrease your risk of falls. This may include working with a physical therapist or trainer to improve your strength, balance, and endurance. This information is not intended to replace advice given to you by your health care provider. Make sure you discuss any questions you have with your health care provider. Document Released: 02/07/2002 Document Revised: 07/17/2015 Document Reviewed: 03/24/2014 Elsevier Interactive Patient Education  2017 [place] Maintenance, Female Introduction Adopting a healthy lifestyle and getting preventive care can go a long way to promote health and wellness. Talk with your health care provider about what schedule of regular examinations is right for you. This is a good chance for you to check in with your provider about disease prevention and staying healthy. In between checkups, there are plenty of things you can do on your own. Experts have done a lot of research about which lifestyle changes and preventive measures are most likely to keep you healthy. Ask your health care provider for more information. Weight and diet Eat a healthy diet  Be sure to include plenty of vegetables, fruits, low-fat dairy products, and lean protein.  Do not eat a lot of foods high in solid fats, added sugars, or salt.  Get regular exercise. This is one of the most important things you can do for your health.  Most adults should exercise for at least 150 minutes each week. The exercise should increase your heart rate and make you sweat (moderate-intensity exercise).  Most adults should also do strengthening exercises at least twice a week. This is in addition to the moderate-intensity exercise. Maintain a healthy weight  Body mass index (BMI) is a measurement that can be used to identify possible weight problems. It estimates body fat based on height and weight. Your health care provider can help  determine your BMI and help you achieve or maintain a healthy weight.  For females 19 years of age and older:  A BMI below 18.5 is  considered underweight.  A BMI of 18.5 to 24.9 is normal.  A BMI of 25 to 29.9 is considered overweight.  A BMI of 30 and above is considered obese. Watch levels of cholesterol and blood lipids  You should start having your blood tested for lipids and cholesterol at 70 years of age, then have this test every 5 years.  You may need to have your cholesterol levels checked more often if:  Your lipid or cholesterol levels are high.  You are older than 70 years of age.  You are at high risk for heart disease. Cancer screening Lung Cancer  Lung cancer screening is recommended for adults 75-31 years old who are at high risk for lung cancer because of a history of smoking.  A yearly low-dose CT scan of the lungs is recommended for people who:  Currently smoke.  Have quit within the past 15 years.  Have at least a 30-pack-year history of smoking. A pack year is smoking an average of one pack of cigarettes a day for 1 year.  Yearly screening should continue until it has been 15 years since you quit.  Yearly screening should stop if you develop a health problem that would prevent you from having lung cancer treatment. Breast Cancer  Practice breast self-awareness. This means understanding how your breasts normally appear and feel.  It also means doing regular breast self-exams. Let your health care provider know about any changes, no matter how small.  If you are in your 20s or 30s, you should have a clinical breast exam (CBE) by a health care provider every 1-3 years as part of a regular health exam.  If you are 68 or older, have a CBE every year. Also consider having a breast X-ray (mammogram) every year.  If you have a family history of breast cancer, talk to your health care provider about genetic screening.  If you are at high risk for breast  cancer, talk to your health care provider about having an MRI and a mammogram every year.  Breast cancer gene (BRCA) assessment is recommended for women who have family members with BRCA-related cancers. BRCA-related cancers include:  Breast.  Ovarian.  Tubal.  Peritoneal cancers.  Results of the assessment will determine the need for genetic counseling and BRCA1 and BRCA2 testing. Cervical Cancer  Your health care provider may recommend that you be screened regularly for cancer of the pelvic organs (ovaries, uterus, and vagina). This screening involves a pelvic examination, including checking for microscopic changes to the surface of your cervix (Pap test). You may be encouraged to have this screening done every 3 years, beginning at age 77.  For women ages 32-65, health care providers may recommend pelvic exams and Pap testing every 3 years, or they may recommend the Pap and pelvic exam, combined with testing for human papilloma virus (HPV), every 5 years. Some types of HPV increase your risk of cervical cancer. Testing for HPV may also be done on women of any age with unclear Pap test results.  Other health care providers may not recommend any screening for nonpregnant women who are considered low risk for pelvic cancer and who do not have symptoms. Ask your health care provider if a screening pelvic exam is right for you.  If you have had past treatment for cervical cancer or a condition that could lead to cancer, you need Pap tests and screening for cancer for at least 20 years after your treatment. If Pap tests have been  discontinued, your risk factors (such as having a new sexual partner) need to be reassessed to determine if screening should resume. Some women have medical problems that increase the chance of getting cervical cancer. In these cases, your health care provider may recommend more frequent screening and Pap tests. Colorectal Cancer  This type of cancer can be detected and  often prevented.  Routine colorectal cancer screening usually begins at 70 years of age and continues through 70 years of age.  Your health care provider may recommend screening at an earlier age if you have risk factors for colon cancer.  Your health care provider may also recommend using home test kits to check for hidden blood in the stool.  A small camera at the end of a tube can be used to examine your colon directly (sigmoidoscopy or colonoscopy). This is done to check for the earliest forms of colorectal cancer.  Routine screening usually begins at age 36.  Direct examination of the colon should be repeated every 5-10 years through 70 years of age. However, you may need to be screened more often if early forms of precancerous polyps or small growths are found. Skin Cancer  Check your skin from head to toe regularly.  Tell your health care provider about any new moles or changes in moles, especially if there is a change in a mole's shape or color.  Also tell your health care provider if you have a mole that is larger than the size of a pencil eraser.  Always use sunscreen. Apply sunscreen liberally and repeatedly throughout the day.  Protect yourself by wearing long sleeves, pants, a wide-brimmed hat, and sunglasses whenever you are outside. Heart disease, diabetes, and high blood pressure  High blood pressure causes heart disease and increases the risk of stroke. High blood pressure is more likely to develop in:  People who have blood pressure in the high end of the normal range (130-139/85-89 mm Hg).  People who are overweight or obese.  People who are African American.  If you are 16-20 years of age, have your blood pressure checked every 3-5 years. If you are 13 years of age or older, have your blood pressure checked every year. You should have your blood pressure measured twice-once when you are at a hospital or clinic, and once when you are not at a hospital or clinic.  Record the average of the two measurements. To check your blood pressure when you are not at a hospital or clinic, you can use:  An automated blood pressure machine at a pharmacy.  A home blood pressure monitor.  If you are between 48 years and 25 years old, ask your health care provider if you should take aspirin to prevent strokes.  Have regular diabetes screenings. This involves taking a blood sample to check your fasting blood sugar level.  If you are at a normal weight and have a low risk for diabetes, have this test once every three years after 70 years of age.  If you are overweight and have a high risk for diabetes, consider being tested at a younger age or more often. Preventing infection Hepatitis B  If you have a higher risk for hepatitis B, you should be screened for this virus. You are considered at high risk for hepatitis B if:  You were born in a country where hepatitis B is common. Ask your health care provider which countries are considered high risk.  Your parents were born in a high-risk country,  and you have not been immunized against hepatitis B (hepatitis B vaccine).  You have HIV or AIDS.  You use needles to inject street drugs.  You live with someone who has hepatitis B.  You have had sex with someone who has hepatitis B.  You get hemodialysis treatment.  You take certain medicines for conditions, including cancer, organ transplantation, and autoimmune conditions. Hepatitis C  Blood testing is recommended for:  Everyone born from [birth year] through 1964.  Anyone with known risk factors for hepatitis C. Sexually transmitted infections (STIs)  You should be screened for sexually transmitted infections (STIs) including gonorrhea and chlamydia if:  You are sexually active and are younger than 70 years of age.  You are older than 70 years of age and your health care provider tells you that you are at risk for this type of infection.  Your sexual activity  has changed since you were last screened and you are at an increased risk for chlamydia or gonorrhea. Ask your health care provider if you are at risk.  If you do not have HIV, but are at risk, it may be recommended that you take a prescription medicine daily to prevent HIV infection. This is called pre-exposure prophylaxis (PrEP). You are considered at risk if:  You are sexually active and do not regularly use condoms or know the HIV status of your partner(s).  You take drugs by injection.  You are sexually active with a partner who has HIV. Talk with your health care provider about whether you are at high risk of being infected with HIV. If you choose to begin PrEP, you should first be tested for HIV. You should then be tested every 3 months for as long as you are taking PrEP. Pregnancy  If you are premenopausal and you may become pregnant, ask your health care provider about preconception counseling.  If you may become pregnant, take 400 to 800 micrograms (mcg) of folic acid every day.  If you want to prevent pregnancy, talk to your health care provider about birth control (contraception). Osteoporosis and menopause  Osteoporosis is a disease in which the bones lose minerals and strength with aging. This can result in serious bone fractures. Your risk for osteoporosis can be identified using a bone density scan.  If you are 62 years of age or older, or if you are at risk for osteoporosis and fractures, ask your health care provider if you should be screened.  Ask your health care provider whether you should take a calcium or vitamin D supplement to lower your risk for osteoporosis.  Menopause may have certain physical symptoms and risks.  Hormone replacement therapy may reduce some of these symptoms and risks. Talk to your health care provider about whether hormone replacement therapy is right for you. Follow these instructions at home:  Schedule regular health, dental, and eye  exams.  Stay current with your immunizations.  Do not use any tobacco products including cigarettes, chewing tobacco, or electronic cigarettes.  If you are pregnant, do not drink alcohol.  If you are breastfeeding, limit how much and how often you drink alcohol.  Limit alcohol intake to no more than 1 drink per day for nonpregnant women. One drink equals 12 ounces of beer, 5 ounces of wine, or 1 ounces of hard liquor.  Do not use street drugs.  Do not share needles.  Ask your health care provider for help if you need support or information about quitting drugs.  Tell your health  care provider if you often feel depressed.  Tell your health care provider if you have ever been abused or do not feel safe at home. This information is not intended to replace advice given to you by your health care provider. Make sure you discuss any questions you have with your health care provider. Document Released: 09/02/2010 Document Revised: 07/26/2015 Document Reviewed: 11/21/2014  2017 Elsevier   Diabetes and Foot Care Diabetes may cause you to have problems because of poor blood supply (circulation) to your feet and legs. This may cause the skin on your feet to become thinner, break easier, and heal more slowly. Your skin may become dry, and the skin may peel and crack. You may also have nerve damage in your legs and feet causing decreased feeling in them. You may not notice minor injuries to your feet that could lead to infections or more serious problems. Taking care of your feet is one of the most important things you can do for yourself. Follow these instructions at home:  Wear shoes at all times, even in the house. Do not go barefoot. Bare feet are easily injured.  Check your feet daily for blisters, cuts, and redness. If you cannot see the bottom of your feet, use a mirror or ask someone for help.  Wash your feet with warm water (do not use hot water) and mild soap. Then pat your feet and  the areas between your toes until they are completely dry. Do not soak your feet as this can dry your skin.  Apply a moisturizing lotion or petroleum jelly (that does not contain alcohol and is unscented) to the skin on your feet and to dry, brittle toenails. Do not apply lotion between your toes.  Trim your toenails straight across. Do not dig under them or around the cuticle. File the edges of your nails with an emery board or nail file.  Do not cut corns or calluses or try to remove them with medicine.  Wear clean socks or stockings every day. Make sure they are not too tight. Do not wear knee-high stockings since they may decrease blood flow to your legs.  Wear shoes that fit properly and have enough cushioning. To break in new shoes, wear them for just a few hours a day. This prevents you from injuring your feet. Always look in your shoes before you put them on to be sure there are no objects inside.  Do not cross your legs. This may decrease the blood flow to your feet.  If you find a minor scrape, cut, or break in the skin on your feet, keep it and the skin around it clean and dry. These areas may be cleansed with mild soap and water. Do not cleanse the area with peroxide, alcohol, or iodine.  When you remove an adhesive bandage, be sure not to damage the skin around it.  If you have a wound, look at it several times a day to make sure it is healing.  Do not use heating pads or hot water bottles. They may burn your skin. If you have lost feeling in your feet or legs, you may not know it is happening until it is too late.  Make sure your health care provider performs a complete foot exam at least annually or more often if you have foot problems. Report any cuts, sores, or bruises to your health care provider immediately. Contact a health care provider if:  You have an injury that is not  healing.  You have cuts or breaks in the skin.  You have an ingrown nail.  You notice redness  on your legs or feet.  You feel burning or tingling in your legs or feet.  You have pain or cramps in your legs and feet.  Your legs or feet are numb.  Your feet always feel cold. Get help right away if:  There is increasing redness, swelling, or pain in or around a wound.  There is a red line that goes up your leg.  Pus is coming from a wound.  You develop a fever or as directed by your health care provider.  You notice a bad smell coming from an ulcer or wound. This information is not intended to replace advice given to you by your health care provider. Make sure you discuss any questions you have with your health care provider. Document Released: 02/15/2000 Document Revised: 07/26/2015 Document Reviewed: 07/27/2012 Elsevier Interactive Patient Education  2017 [employer].

## 2016-04-19 LAB — LIPID PANEL
Cholesterol: 153 mg/dL (ref <200)
HDL: 57 mg/dL (ref >50)
LDL Cholesterol: 76 mg/dL (ref <100)
Total CHOL/HDL Ratio: 2.7 Ratio (ref <5.0)
Triglycerides: 99 mg/dL (ref <150)
VLDL: 20 mg/dL (ref <30)

## 2016-04-19 LAB — BASIC METABOLIC PANEL
BUN: 15 mg/dL (ref 7–25)
CO2: 26 mmol/L (ref 20–31)
Calcium: 8.9 mg/dL (ref 8.6–10.4)
Chloride: 106 mmol/L (ref 98–110)
Creat: 0.88 mg/dL (ref 0.50–0.99)
Glucose, Bld: 92 mg/dL (ref 65–99)
Potassium: 4.1 mmol/L (ref 3.5–5.3)
Sodium: 142 mmol/L (ref 135–146)

## 2016-04-19 LAB — HEPATIC FUNCTION PANEL
ALT: 11 U/L (ref 6–29)
AST: 15 U/L (ref 10–35)
Albumin: 3.6 g/dL (ref 3.6–5.1)
Alkaline Phosphatase: 65 U/L (ref 33–130)
Bilirubin, Direct: 0.1 mg/dL (ref <=0.2)
Indirect Bilirubin: 0.3 mg/dL (ref 0.2–1.2)
Total Bilirubin: 0.4 mg/dL (ref 0.2–1.2)
Total Protein: 6.7 g/dL (ref 6.1–8.1)

## 2016-04-19 LAB — HEMOGLOBIN A1C
Hgb A1c MFr Bld: 6.7 % — ABNORMAL HIGH (ref <5.7)
Mean Plasma Glucose: 146 mg/dL

## 2016-04-22 ENCOUNTER — Other Ambulatory Visit: Payer: Self-pay | Admitting: Family Medicine

## 2016-04-27 ENCOUNTER — Other Ambulatory Visit: Payer: Self-pay | Admitting: Family Medicine

## 2016-05-29 ENCOUNTER — Other Ambulatory Visit: Payer: Self-pay | Admitting: Dermatology

## 2016-05-29 DIAGNOSIS — E755 Other lipid storage disorders: Secondary | ICD-10-CM | POA: Diagnosis not present

## 2016-05-29 DIAGNOSIS — D485 Neoplasm of uncertain behavior of skin: Secondary | ICD-10-CM | POA: Diagnosis not present

## 2016-07-14 DIAGNOSIS — D485 Neoplasm of uncertain behavior of skin: Secondary | ICD-10-CM | POA: Diagnosis not present

## 2016-07-18 ENCOUNTER — Telehealth: Payer: Self-pay | Admitting: Family Medicine

## 2016-07-18 MED ORDER — HYDROCODONE-ACETAMINOPHEN 10-325 MG PO TABS: 1.0000 | ORAL_TABLET | Freq: Four times a day (QID) | ORAL | 0 refills | Status: DC | PRN

## 2016-07-18 NOTE — Telephone Encounter (Signed)
Pt need new Rx for Hydrocodone   Pt is aware of 3 business days for refills and someone will call when ready for pick up. °

## 2016-07-18 NOTE — Telephone Encounter (Signed)
Spoke with pt and advised rx at front desk. Nothing further needed.

## 2016-07-18 NOTE — Telephone Encounter (Signed)
done

## 2016-08-05 DIAGNOSIS — L570 Actinic keratosis: Secondary | ICD-10-CM | POA: Diagnosis not present

## 2016-08-18 ENCOUNTER — Telehealth: Payer: Self-pay | Admitting: Family Medicine

## 2016-08-18 MED ORDER — HYDROCODONE-ACETAMINOPHEN 10-325 MG PO TABS: 1.0000 | ORAL_TABLET | Freq: Four times a day (QID) | ORAL | 0 refills | Status: DC | PRN

## 2016-08-18 NOTE — Telephone Encounter (Signed)
° ° °  Pt request refill of the following: ° ° °HYDROcodone-acetaminophen (NORCO) 10-325 MG tablet ° ° °Phamacy: °

## 2016-08-18 NOTE — Telephone Encounter (Signed)
Done

## 2016-08-19 NOTE — Telephone Encounter (Signed)
Script is ready for pick up here at front office and l left a voice message for pt.

## 2016-09-05 ENCOUNTER — Other Ambulatory Visit: Payer: Self-pay | Admitting: Family Medicine

## 2016-09-09 DIAGNOSIS — Z1231 Encounter for screening mammogram for malignant neoplasm of breast: Secondary | ICD-10-CM | POA: Diagnosis not present

## 2016-09-09 LAB — HM MAMMOGRAPHY

## 2016-09-11 ENCOUNTER — Encounter: Payer: Self-pay | Admitting: Family Medicine

## 2016-09-17 ENCOUNTER — Telehealth: Payer: Self-pay | Admitting: Family Medicine

## 2016-09-17 MED ORDER — HYDROCODONE-ACETAMINOPHEN 10-325 MG PO TABS: 1.0000 | ORAL_TABLET | Freq: Four times a day (QID) | ORAL | 0 refills | Status: DC | PRN

## 2016-09-17 NOTE — Telephone Encounter (Signed)
° ° °  Pt request refill of the following: ° ° °HYDROcodone-acetaminophen (NORCO) 10-325 MG tablet ° ° °Phamacy: °

## 2016-09-17 NOTE — Telephone Encounter (Signed)
Script is ready for pick up here at front office and tried to reach pt, no answer.  

## 2016-09-17 NOTE — Telephone Encounter (Signed)
Done

## 2016-09-19 ENCOUNTER — Ambulatory Visit (INDEPENDENT_AMBULATORY_CARE_PROVIDER_SITE_OTHER): Payer: Medicare HMO | Admitting: Family Medicine

## 2016-09-19 ENCOUNTER — Encounter: Payer: Self-pay | Admitting: Family Medicine

## 2016-09-19 VITALS — BP 137/87 | HR 66 | Temp 98.2°F | Ht 64.0 in | Wt 163.0 lb

## 2016-09-19 DIAGNOSIS — R609 Edema, unspecified: Secondary | ICD-10-CM | POA: Diagnosis not present

## 2016-09-19 DIAGNOSIS — I10 Essential (primary) hypertension: Secondary | ICD-10-CM | POA: Diagnosis not present

## 2016-09-19 DIAGNOSIS — E119 Type 2 diabetes mellitus without complications: Secondary | ICD-10-CM

## 2016-09-19 DIAGNOSIS — E1140 Type 2 diabetes mellitus with diabetic neuropathy, unspecified: Secondary | ICD-10-CM | POA: Diagnosis not present

## 2016-09-19 DIAGNOSIS — M5440 Lumbago with sciatica, unspecified side: Secondary | ICD-10-CM | POA: Diagnosis not present

## 2016-09-19 LAB — CBC WITH DIFFERENTIAL/PLATELET
Basophils Absolute: 0 10*3/uL (ref 0.0–0.1)
Basophils Relative: 0.5 % (ref 0.0–3.0)
Eosinophils Absolute: 0.1 10*3/uL (ref 0.0–0.7)
Eosinophils Relative: 1.5 % (ref 0.0–5.0)
HCT: 40 % (ref 36.0–46.0)
Hemoglobin: 13 g/dL (ref 12.0–15.0)
Lymphocytes Relative: 43.9 % (ref 12.0–46.0)
Lymphs Abs: 2.3 10*3/uL (ref 0.7–4.0)
MCHC: 32.4 g/dL (ref 30.0–36.0)
MCV: 82.4 fl (ref 78.0–100.0)
Monocytes Absolute: 0.6 10*3/uL (ref 0.1–1.0)
Monocytes Relative: 11 % (ref 3.0–12.0)
Neutro Abs: 2.3 10*3/uL (ref 1.4–7.7)
Neutrophils Relative %: 43.1 % (ref 43.0–77.0)
Platelets: 191 10*3/uL (ref 150.0–400.0)
RBC: 4.85 Mil/uL (ref 3.87–5.11)
RDW: 16.1 % — ABNORMAL HIGH (ref 11.5–15.5)
WBC: 5.3 10*3/uL (ref 4.0–10.5)

## 2016-09-19 LAB — HEPATIC FUNCTION PANEL
ALT: 11 U/L (ref 0–35)
AST: 13 U/L (ref 0–37)
Albumin: 3.7 g/dL (ref 3.5–5.2)
Alkaline Phosphatase: 64 U/L (ref 39–117)
Bilirubin, Direct: 0.1 mg/dL (ref 0.0–0.3)
Total Bilirubin: 0.6 mg/dL (ref 0.2–1.2)
Total Protein: 7 g/dL (ref 6.0–8.3)

## 2016-09-19 LAB — POC URINALSYSI DIPSTICK (AUTOMATED)
Bilirubin, UA: NEGATIVE
Blood, UA: NEGATIVE
Glucose, UA: NEGATIVE
Ketones, UA: NEGATIVE
Leukocytes, UA: NEGATIVE
Nitrite, UA: NEGATIVE
Protein, UA: NEGATIVE
Spec Grav, UA: >=1.03 — AB (ref 1.010–1.025)
Urobilinogen, UA: 0.2 E.U./dL
pH, UA: 6 (ref 5.0–8.0)

## 2016-09-19 LAB — HEMOGLOBIN A1C: Hgb A1c MFr Bld: 7.3 % — ABNORMAL HIGH (ref 4.6–6.5)

## 2016-09-19 LAB — BASIC METABOLIC PANEL
BUN: 18 mg/dL (ref 6–23)
CO2: 32 mEq/L (ref 19–32)
Calcium: 9.4 mg/dL (ref 8.4–10.5)
Chloride: 104 mEq/L (ref 96–112)
Creatinine, Ser: 0.92 mg/dL (ref 0.40–1.20)
GFR: 77.68 mL/min (ref >60.00)
Glucose, Bld: 101 mg/dL — ABNORMAL HIGH (ref 70–99)
Potassium: 3.9 mEq/L (ref 3.5–5.1)
Sodium: 141 mEq/L (ref 135–145)

## 2016-09-19 LAB — LIPID PANEL
Cholesterol: 166 mg/dL (ref 0–200)
HDL: 61.1 mg/dL (ref >39.00)
LDL Cholesterol: 93 mg/dL (ref 0–99)
NonHDL: 104.61
Total CHOL/HDL Ratio: 3
Triglycerides: 60 mg/dL (ref 0.0–149.0)
VLDL: 12 mg/dL (ref 0.0–40.0)

## 2016-09-19 LAB — TSH: TSH: 0.76 u[IU]/mL (ref 0.35–4.50)

## 2016-09-19 NOTE — Progress Notes (Signed)
   Subjective:    Patient ID: Wendy [name], female    DOB: [birth year], 71 y.o.   MRN: [id number]  HPI Here to follow up. Her BP and glucoses have been stable at home. She has been having more low back pain lately and she takes Ibuprofen as needed. The last time she saw her neurosurgeon in [place] was in 2015.    Review of Systems  Constitutional: Negative.   Respiratory: Negative.   Cardiovascular: Negative.   Musculoskeletal: Positive for back pain.  Neurological: Negative.        Objective:   Physical Exam  Constitutional: She is oriented to person, place, and time. She appears well-developed and well-nourished.  Cardiovascular: Normal rate, regular rhythm, normal heart sounds and intact distal pulses.   Pulmonary/Chest: Effort normal and breath sounds normal.  Musculoskeletal: Normal range of motion. She exhibits no edema or tenderness.  Neurological: She is alert and oriented to person, place, and time.          Assessment & Plan:  Her HTN is stable. We will check her diabetes, lipids, etc with fasting labs today. For the back pain I suggested she contact the neurosurgery office again.  [name], MD

## 2016-09-19 NOTE — Patient Instructions (Signed)
WE NOW OFFER   [facility] FAST TRACK!!!  SAME DAY Appointments for ACUTE CARE  Such as: Sprains, Injuries, cuts, abrasions, rashes, muscle pain, joint pain, back pain Colds, flu, sore throats, headache, allergies, cough, fever  Ear pain, sinus and eye infections Abdominal pain, nausea, vomiting, diarrhea, upset stomach Animal/insect bites  3 Easy Ways to Schedule: Walk-In Scheduling Call in scheduling Mychart Sign-up: [email or web address]         

## 2016-10-15 DIAGNOSIS — D485 Neoplasm of uncertain behavior of skin: Secondary | ICD-10-CM | POA: Diagnosis not present

## 2016-10-20 ENCOUNTER — Telehealth: Payer: Self-pay | Admitting: Family Medicine

## 2016-10-20 MED ORDER — HYDROCODONE-ACETAMINOPHEN 10-325 MG PO TABS: 1.0000 | ORAL_TABLET | Freq: Four times a day (QID) | ORAL | 0 refills | Status: DC | PRN

## 2016-10-20 NOTE — Telephone Encounter (Signed)
Pt needs new rx hydrocodone °

## 2016-10-20 NOTE — Telephone Encounter (Signed)
Dr. Sarajane Jews please see refill request    lmom tcb x1 to see if pt was able to reach her neurosurgeon

## 2016-10-20 NOTE — Telephone Encounter (Signed)
Ready to pick up.  

## 2016-10-20 NOTE — Telephone Encounter (Signed)
lmom tcb x1, rx is ready and placed up front for pick up

## 2016-10-24 ENCOUNTER — Telehealth: Payer: Self-pay | Admitting: Family Medicine

## 2016-10-24 NOTE — Telephone Encounter (Signed)
I called pt to ask about mammogram, she had one done on 09/09/2016 and I did update her chart.

## 2016-11-18 DIAGNOSIS — L570 Actinic keratosis: Secondary | ICD-10-CM | POA: Diagnosis not present

## 2016-11-20 ENCOUNTER — Telehealth: Payer: Self-pay | Admitting: Family Medicine

## 2016-11-20 MED ORDER — HYDROCODONE-ACETAMINOPHEN 10-325 MG PO TABS: 1.0000 | ORAL_TABLET | Freq: Four times a day (QID) | ORAL | 0 refills | Status: DC | PRN

## 2016-11-20 NOTE — Telephone Encounter (Signed)
° ° °  Pt request refill of the following: ° ° °HYDROcodone-acetaminophen (NORCO) 10-325 MG tablet ° ° °Phamacy: °

## 2016-11-20 NOTE — Telephone Encounter (Signed)
Done

## 2016-11-21 NOTE — Telephone Encounter (Signed)
Script is ready for pick up here at front office, tried to reach pt and no answer.  

## 2016-11-21 NOTE — Telephone Encounter (Signed)
Pt is aware.  

## 2016-12-16 ENCOUNTER — Ambulatory Visit: Payer: Medicare HMO

## 2016-12-19 ENCOUNTER — Ambulatory Visit: Payer: Medicare HMO

## 2016-12-22 ENCOUNTER — Telehealth: Payer: Self-pay | Admitting: Family Medicine

## 2016-12-22 NOTE — Telephone Encounter (Signed)
° ° °  Pt request refill of the following: ° ° °HYDROcodone-acetaminophen (NORCO) 10-325 MG tablet ° ° °Phamacy: °

## 2016-12-23 MED ORDER — HYDROCODONE-ACETAMINOPHEN 10-325 MG PO TABS: 1.0000 | ORAL_TABLET | Freq: Four times a day (QID) | ORAL | 0 refills | Status: DC | PRN

## 2016-12-23 NOTE — Telephone Encounter (Signed)
Script is ready for pick up here at front office and tried to reach pt, no answer, also letter was given.

## 2016-12-23 NOTE — Telephone Encounter (Signed)
Done

## 2016-12-24 ENCOUNTER — Emergency Department (HOSPITAL_COMMUNITY)
Admission: EM | Admit: 2016-12-24 | Discharge: 2016-12-24 | Disposition: A | Discharge status: Home / Self Care | Payer: Medicare HMO | Attending: Emergency Medicine | Admitting: Emergency Medicine

## 2016-12-24 ENCOUNTER — Encounter (HOSPITAL_COMMUNITY): Payer: Self-pay | Admitting: *Deleted

## 2016-12-24 DIAGNOSIS — E876 Hypokalemia: Secondary | ICD-10-CM | POA: Diagnosis not present

## 2016-12-24 DIAGNOSIS — G8929 Other chronic pain: Secondary | ICD-10-CM | POA: Insufficient documentation

## 2016-12-24 DIAGNOSIS — F17210 Nicotine dependence, cigarettes, uncomplicated: Secondary | ICD-10-CM | POA: Insufficient documentation

## 2016-12-24 DIAGNOSIS — R350 Frequency of micturition: Secondary | ICD-10-CM | POA: Insufficient documentation

## 2016-12-24 DIAGNOSIS — Z7984 Long term (current) use of oral hypoglycemic drugs: Secondary | ICD-10-CM | POA: Diagnosis not present

## 2016-12-24 DIAGNOSIS — I10 Essential (primary) hypertension: Secondary | ICD-10-CM | POA: Insufficient documentation

## 2016-12-24 DIAGNOSIS — M549 Dorsalgia, unspecified: Secondary | ICD-10-CM | POA: Diagnosis not present

## 2016-12-24 DIAGNOSIS — Z79899 Other long term (current) drug therapy: Secondary | ICD-10-CM | POA: Diagnosis not present

## 2016-12-24 DIAGNOSIS — R300 Dysuria: Secondary | ICD-10-CM | POA: Diagnosis not present

## 2016-12-24 DIAGNOSIS — N390 Urinary tract infection, site not specified: Secondary | ICD-10-CM | POA: Insufficient documentation

## 2016-12-24 DIAGNOSIS — Z794 Long term (current) use of insulin: Secondary | ICD-10-CM | POA: Diagnosis not present

## 2016-12-24 DIAGNOSIS — E1165 Type 2 diabetes mellitus with hyperglycemia: Secondary | ICD-10-CM | POA: Diagnosis not present

## 2016-12-24 DIAGNOSIS — R110 Nausea: Secondary | ICD-10-CM | POA: Diagnosis not present

## 2016-12-24 DIAGNOSIS — N12 Tubulo-interstitial nephritis, not specified as acute or chronic: Secondary | ICD-10-CM | POA: Insufficient documentation

## 2016-12-24 DIAGNOSIS — R109 Unspecified abdominal pain: Secondary | ICD-10-CM | POA: Insufficient documentation

## 2016-12-24 DIAGNOSIS — R1030 Lower abdominal pain, unspecified: Secondary | ICD-10-CM | POA: Diagnosis present

## 2016-12-24 LAB — COMPREHENSIVE METABOLIC PANEL
ALT: 13 U/L — ABNORMAL LOW (ref 14–54)
AST: 20 U/L (ref 15–41)
Albumin: 3.7 g/dL (ref 3.5–5.0)
Alkaline Phosphatase: 69 U/L (ref 38–126)
Anion gap: 7 (ref 5–15)
BUN: 14 mg/dL (ref 6–20)
CO2: 28 mmol/L (ref 22–32)
Calcium: 9.1 mg/dL (ref 8.9–10.3)
Chloride: 103 mmol/L (ref 101–111)
Creatinine, Ser: 0.96 mg/dL (ref 0.44–1.00)
GFR calc Af Amer: >60 mL/min (ref >60)
GFR calc non Af Amer: 59 mL/min — ABNORMAL LOW (ref >60)
Glucose, Bld: 167 mg/dL — ABNORMAL HIGH (ref 65–99)
Potassium: 3.1 mmol/L — ABNORMAL LOW (ref 3.5–5.1)
Sodium: 138 mmol/L (ref 135–145)
Total Bilirubin: 1.2 mg/dL (ref 0.3–1.2)
Total Protein: 7.9 g/dL (ref 6.5–8.1)

## 2016-12-24 LAB — URINALYSIS, ROUTINE W REFLEX MICROSCOPIC
Bilirubin Urine: NEGATIVE
Glucose, UA: NEGATIVE mg/dL
Ketones, ur: NEGATIVE mg/dL
Nitrite: POSITIVE — AB
Protein, ur: 100 mg/dL — AB
Specific Gravity, Urine: 1.014 (ref 1.005–1.030)
Squamous Epithelial / LPF: NONE SEEN
pH: 5 (ref 5.0–8.0)

## 2016-12-24 LAB — CBC
HCT: 42.1 % (ref 36.0–46.0)
Hemoglobin: 13.9 g/dL (ref 12.0–15.0)
MCH: 26.7 pg (ref 26.0–34.0)
MCHC: 33 g/dL (ref 30.0–36.0)
MCV: 81 fL (ref 78.0–100.0)
Platelets: 197 10*3/uL (ref 150–400)
RBC: 5.2 MIL/uL — ABNORMAL HIGH (ref 3.87–5.11)
RDW: 15.6 % — ABNORMAL HIGH (ref 11.5–15.5)
WBC: 10.3 10*3/uL (ref 4.0–10.5)

## 2016-12-24 LAB — LIPASE, BLOOD: Lipase: 69 U/L — ABNORMAL HIGH (ref 11–51)

## 2016-12-24 LAB — PREGNANCY, URINE: Preg Test, Ur: NEGATIVE

## 2016-12-24 MED ORDER — ONDANSETRON 4 MG PO TBDP
4.0000 mg | ORAL_TABLET | Freq: Once | ORAL | Status: AC | PRN
  Administered 2016-12-24: 4 mg via ORAL

## 2016-12-24 MED ORDER — SULFAMETHOXAZOLE-TRIMETHOPRIM 800-160 MG PO TABS: 1.0000 | ORAL_TABLET | Freq: Two times a day (BID) | ORAL | 0 refills | Status: AC

## 2016-12-24 MED ORDER — ONDANSETRON 4 MG PO TBDP
ORAL_TABLET | ORAL | Status: AC
  Filled 2016-12-24: qty 1

## 2016-12-24 MED ORDER — ONDANSETRON 4 MG PO TBDP: 4.0000 mg | ORAL_TABLET | Freq: Three times a day (TID) | ORAL | 0 refills | Status: DC | PRN

## 2016-12-24 MED ORDER — POTASSIUM CHLORIDE CRYS ER 20 MEQ PO TBCR
40.0000 meq | EXTENDED_RELEASE_TABLET | Freq: Once | ORAL | Status: AC
  Administered 2016-12-24: 40 meq via ORAL
  Filled 2016-12-24: qty 2

## 2016-12-24 NOTE — ED Triage Notes (Addendum)
Pt states bil flank pain that radiates to lower abdomen.  Also c/o nausea and urinary frequency.  Pt did not take bp meds today d/t nausea.

## 2016-12-24 NOTE — Discharge Instructions (Signed)
Stay very well hydrated with plenty of water throughout the day. Take antibiotic until completed. Use zofran as directed as needed for nausea. Alternate between tylenol and motrin as needed for pain, or use home pain medications as needed. Your potassium level was a little low today, you were given potassium here, but take all of your home medications as instructed, and see the list of potassium rich foods below to help supplement your diet. Eat a low salt diet to help control your blood pressure. Follow up with your primary care physician in 1 week for recheck of ongoing symptoms but return to ER for emergent changing or worsening of symptoms. Please seek immediate care if you develop the following: You develop back pain.  Your symptoms are no better, or worse in 3 days. There is severe back pain or lower abdominal pain.  You develop chills.  You have a fever.  There is nausea or vomiting.  There is continued burning or discomfort with urination.

## 2016-12-24 NOTE — ED Provider Notes (Signed)
[facility] EMERGENCY DEPARTMENT Provider Note   CSN: [id number] Arrival date & time: 12/24/16  1340     History   Chief Complaint Chief Complaint  Patient presents with  . Abdominal Pain    HPI Wendy [name] is a 71 y.o. female with a PMHx of DM2, GERD, hepC, HTN, low back pain s/p lumbar laminectomy, RA, and other conditions listed below, who presents to the ED with complaints of multiple complaints. Her primary complaint is 3 days of increased urinary frequency/urgency, dysuria, and suprapubic abdominal pain that radiates towards the flanks R>L. She describes this pain as 10/10 constant sharp and pressure-like pain "like a band" around her suprapubic area and towards her flanks mostly on the right, worse with stretching her back, and moderately improved with home hydrocodone and Aleve. She also reports nausea for the last 3 weeks which has worsened with her urinary symptoms. Patient states that she has chronic mid to low back pain that's been ongoing for at least 3 months but has gradually worsened over the last several weeks, however this is different than the flank pain she is experiencing today. She sees a Publishing rights manager at [facility] as well as her PCP for this back pain, and is on chronic hydrocodone for it. She's not sure if she's in menopause yet because a few months ago she did have an "odd period", but she hasn't had a "normal period" since she was 71y/o. Her PCP is Dr. Sarajane Jews at [facility]. Of note, she has not taken her home medications today because she hasn't had an appetite today due to her suprapubic pain and nausea.   She denies HA, vision changes, LE swelling, fevers, chills, CP, SOB, vomiting, diarrhea/constipation, obstipation, melena, hematochezia, hematuria, malodorous urine, vaginal bleeding/discharge, incontinence of urine/stool, saddle anesthesia/cauda equina symptoms, focal myalgias/arthralgias, numbness, tingling, focal weakness, or any other  complaints at this time. Denies recent travel, sick contacts, suspicious food intake, recent EtOH use, or frequent NSAID use.    The history is provided by the patient and medical records. No language interpreter was used.  Flank Pain  This is a new problem. The current episode started more than 2 days ago. The problem occurs constantly. The problem has not changed since onset.Pertinent negatives include no chest pain, no abdominal pain, no headaches and no shortness of breath. Exacerbated by: stretching back. The symptoms are relieved by narcotics and NSAIDs. Treatments tried: hydrocodone and aleve. The treatment provided moderate relief.    Past Medical History:  Diagnosis Date  . DEPENDENT EDEMA 07/21/2007  . DIABETES MELLITUS, TYPE II 11/05/2006  . FOOT PAIN 05/23/2009  . GERD 11/20/2006  . HEPATITIS C 02/08/2007   sees GI @ [place] 11/05/2006  . LOW BACK PAIN SYNDROME 07/21/2007  . 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 Rheumatoid arthritis(714.0) 11/05/2006   sees Dr. Gavin Pound     Patient Active Problem List   Diagnosis Date Noted  . Diabetes mellitus without complication ([place]) 92/01/9416  . FOOT PAIN 05/23/2009  . ACUTE SINUSITIS, UNSPECIFIED 02/12/2009  . LOW BACK PAIN SYNDROME 07/21/2007  . DEPENDENT EDEMA 07/21/2007  . HEPATITIS C 02/08/2007  . GERD 11/20/2006  . CANDIDIASIS, VULVA/VAGINA 11/05/2006  . Diabetic neuropathy, type II diabetes mellitus ([place]) 11/05/2006  . Essential hypertension 11/05/2006  . GINGIVITIS, ACUTE, PLAQUE INDUCED 11/05/2006  . Rheumatoid arthritis ([place]) 11/05/2006  . 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 COLONOSCOPY  06-20-08   at [facility], clear   . ESOPHAGOGASTRODUODENOSCOPY  06-20-08   at [facility], clear   . LUMBAR LAMINECTOMY     1995    OB History    No data available       Home Medications    Prior to Admission medications   Medication Sig Start  Date End Date Taking? Authorizing Provider  ACCU-CHEK AVIVA PLUS test strip TEST ONCE DAILY  09/05/16   Laurey Morale, MD  CALCIUM-VITAMIN D PO Take 1 tablet by mouth daily.    [provider]  cetirizine (ZYRTEC) 10 MG tablet Take 1 tablet (10 mg total) by mouth daily. 01/10/15   Laurey Morale, MD  fluticasone (FLONASE) 50 MCG/ACT nasal spray PLACE 2 SPRAYS INTO BOTH NOSTRILS DAILY. 04/22/16   Laurey Morale, MD  furosemide (LASIX) 20 MG tablet TAKE 1 TABLET (20 MG TOTAL) BY MOUTH DAILY AS NEEDED (FLUID RETENTION). 04/28/16   Laurey Morale, MD  glipiZIDE (GLUCOTROL) 5 MG tablet Take 1 tablet (5 mg total) by mouth 2 (two) times daily before a meal. 04/18/16   Laurey Morale, MD  HYDROcodone-acetaminophen (NORCO) 10-325 MG tablet Take 1 tablet by mouth every 6 (six) hours as needed for moderate pain. 12/23/16   Laurey Morale, MD  hydroxychloroquine (PLAQUENIL) 200 MG tablet TAKE 1 TABLET (200 MG TOTAL) BY MOUTH DAILY. 01/09/15   Laurey Morale, MD  Lancets (ACCU-CHEK SOFT [facility]) lancets Test once per day and diagnosis code is E 11.9 and dispense for Aviva plus 03/23/14   Laurey Morale, MD  lisinopril-hydrochlorothiazide (PRINZIDE,ZESTORETIC) 20-25 MG tablet TAKE 1 TABLET EVERY DAY 11/28/15   Laurey Morale, MD  omeprazole (PRILOSEC) 40 MG capsule Take 1 capsule (40 mg total) by mouth daily. 07/20/13   Laurey Morale, MD  potassium chloride (K-DUR,KLOR-CON) 10 MEQ tablet Take 1 tablet (10 mEq total) by mouth 2 (two) times daily. Patient taking differently: Take 10 mEq by mouth daily.  02/05/16   Laurey Morale, MD    Family History Family History  Problem Relation Age of Onset  . Arthritis Unknown   . Diabetes Unknown   . Hypertension Unknown   . Cancer Unknown        lung    Social History Social History  Substance Use Topics  . Smoking status: Current Some Day Smoker    Packs/day: 0.03    Years: 45.00    Types: Cigarettes  . Smokeless tobacco: Never Used     Comment: still smokes    . Alcohol use 0.0 oz/week     Comment: occ     Allergies   Aspirin and Tramadol   Review of Systems Review of Systems  Constitutional: Negative for chills and fever.  Eyes: Negative for visual disturbance.  Respiratory: Negative for shortness of breath.   Cardiovascular: Negative for chest pain and leg swelling.  Gastrointestinal: Positive for nausea. Negative for abdominal pain, blood in stool, constipation, diarrhea and vomiting.  Genitourinary: Positive for dysuria, flank pain, frequency and urgency. Negative for difficulty urinating (no incontinence), hematuria, vaginal bleeding and vaginal discharge.       No malodorous urine  Musculoskeletal: Positive for back pain (chronic). Negative for arthralgias and myalgias.  Skin: Negative for color change.  Allergic/Immunologic: Positive for immunocompromised state (DM2).  Neurological: Negative for weakness, numbness and headaches.  Psychiatric/Behavioral: Negative for confusion.   All other systems reviewed and are negative for acute change except as noted in  the HPI.    Physical Exam Updated Vital Signs BP (!) 189/91 (BP Location: Right Arm)   Pulse 81   Temp 99.2 F (37.3 C) (Oral)   Resp 12   Ht 5\' 4"  (1.626 m)   Wt 72.1 kg (159 lb)   SpO2 98%   BMI 27.29 kg/m   Physical Exam  Constitutional: She is oriented to person, place, and time. Vital signs are normal. She appears well-developed and well-nourished.  Non-toxic appearance. No distress.  Afebrile, nontoxic, NAD, pt laughing and joking around, appears younger than stated age, very jovial mood. HTN noted.   HENT:  Head: Normocephalic and atraumatic.  Mouth/Throat: Oropharynx is clear and moist and mucous membranes are normal.  Eyes: Conjunctivae and EOM are normal. Right eye exhibits no discharge. Left eye exhibits no discharge.  Neck: Normal range of motion. Neck supple.  Cardiovascular: Normal rate, regular rhythm, normal heart sounds and intact distal pulses.   Exam reveals no gallop and no friction rub.   No murmur heard. Pulmonary/Chest: Effort normal and breath sounds normal. No respiratory distress. She has no decreased breath sounds. She has no wheezes. She has no rhonchi. She has no rales.  Abdominal: Soft. Normal appearance and bowel sounds are normal. She exhibits no distension. There is tenderness in the suprapubic area. There is CVA tenderness. There is no rigidity, no rebound, no guarding, no tenderness at McBurney's point and negative Murphy's sign.  Soft, nondistended, +BS throughout, with mild suprapubic TTP, no upper abdominal tenderness, no r/g/r, neg murphy's, neg mcburney's, +R sided CVA TTP   Musculoskeletal: Normal range of motion.  MAE x4 Strength and sensation grossly intact in all extremities Distal pulses intact Gait steady No focal midline spinal tenderness, no bony stepoffs or deformities, well healed midline lumbar scar noted.   Neurological: She is alert and oriented to person, place, and time. She has normal strength. No sensory deficit.  Skin: Skin is warm, dry and intact. No rash noted.  Psychiatric: She has a normal mood and affect.  Nursing note and vitals reviewed.    ED Treatments / Results  Labs (all labs ordered are listed, but only abnormal results are displayed) Labs Reviewed  LIPASE, BLOOD - Abnormal; Notable for the following:       Result Value   Lipase 69 (*)    All other components within normal limits  COMPREHENSIVE METABOLIC PANEL - Abnormal; Notable for the following:    Potassium 3.1 (*)    Glucose, Bld 167 (*)    ALT 13 (*)    GFR calc non Af Amer 59 (*)    All other components within normal limits  CBC - Abnormal; Notable for the following:    RBC 5.20 (*)    RDW 15.6 (*)    All other components within normal limits  URINALYSIS, ROUTINE W REFLEX MICROSCOPIC - Abnormal; Notable for the following:    APPearance CLOUDY (*)    Hgb urine dipstick MODERATE (*)    Protein, ur 100 (*)     Nitrite POSITIVE (*)    Leukocytes, UA LARGE (*)    Bacteria, UA MANY (*)    All other components within normal limits  PREGNANCY, URINE    EKG  EKG Interpretation None       Radiology No results found.  Procedures Procedures (including critical care time)  Medications Ordered in ED Medications  ondansetron (ZOFRAN-ODT) 4 MG disintegrating tablet (not administered)  ondansetron (ZOFRAN-ODT) disintegrating tablet 4 mg (4 mg Oral  Given 12/24/16 1354)  potassium chloride SA (K-DUR,KLOR-CON) CR tablet 40 mEq (40 mEq Oral Given 12/24/16 1756)     Initial Impression / Assessment and Plan / ED Course  I have reviewed the triage vital signs and the nursing notes.  Pertinent labs & imaging results that were available during my care of the patient were reviewed by me and considered in my medical decision making (see chart for details).     71 y.o. female here with chronic back pain for several months, nausea for several weeks, and suprapubic pain/R>L flank pain with increased urine frequency/urgency and dysuria for 3 days. Her urinary complaints/suprapubic/flank pain complaints are the main issue that brought her here today. On exam, pt laughing and joking around, in NAD, HTN noted however pt asymptomatic and didn't take her BP meds today so doubt need for further emergent work up of this at this time; mild suprapubic TTP and R CVA TTP, nonperitoneal, no upper abdominal tenderness; no red flag s/sx of back pain, no evidence of cord compression, ambulatory without difficulty. Work up thus far: CBC WNL, CMP with mildly low K 3.1 so will replete now and gluc 167 but otherwise unremarkable, lipase marginally elevated at 69 but doubt truly clinically significant given lack of exam findings for pancreatitis. U/A with +nitrite +leuks TNTC WBC many bacteria and no squamous, consistent with UTI. Overall, symptoms likely related to early pyelonephritis/UTI; her chronic back pain is a separate issue  and advised f/up with her regular doctor/neurosurgeon but doubt need for further emergent eval of this today. Will get Upreg to ensure no pregnancy since pt still has a uterus and it's not clear if she's postmenopausal or not; however doubt need for pelvic exam or further imaging. Will give Kdur here, and await Upreg results. Will reassess shortly  7:11 PM Upreg negative. Pt tolerating PO well. BP improving down to 150s/100s without any acute intervention. Well appearing, stable for d/c at this time. Advised use of home pain meds/OTC remedies for symptomatic relief, will rx zofran, discussed staying hydrated. Rx abx for UTI/early pyelo. Discussed DASH diet and use of home medications/BP meds. F/up with PCP in 1wk for recheck. I explained the diagnosis and have given explicit precautions to return to the ER including for any other new or worsening symptoms. The patient understands and accepts the medical plan as it's been dictated and I have answered their questions. Discharge instructions concerning home care and prescriptions have been given. The patient is STABLE and is discharged to home in good condition.    Final Clinical Impressions(s) / ED Diagnoses   Final diagnoses:  Lower urinary tract infectious disease  Other chronic back pain  Right flank pain  Pyelonephritis  Hypokalemia  Nausea  Type 2 diabetes mellitus with hyperglycemia, without long-term current use of insulin (HCC)    New Prescriptions New Prescriptions   ONDANSETRON (ZOFRAN ODT) 4 MG DISINTEGRATING TABLET    Take 1 tablet (4 mg total) by mouth every 8 (eight) hours as needed for nausea or vomiting.   SULFAMETHOXAZOLE-TRIMETHOPRIM (BACTRIM DS,SEPTRA DS) 800-160 MG TABLET    Take 1 tablet by mouth 2 (two) times daily.       [address] [place], [place] 12/24/16 1912    Forde Dandy, MD 12/25/16 Berniece Salines

## 2017-01-06 ENCOUNTER — Ambulatory Visit: Payer: Medicare HMO | Admitting: Family Medicine

## 2017-01-07 ENCOUNTER — Ambulatory Visit: Payer: Medicare HMO | Admitting: Family Medicine

## 2017-01-07 ENCOUNTER — Encounter: Payer: Self-pay | Admitting: Family Medicine

## 2017-01-07 VITALS — BP 154/96 | Temp 98.6°F | Ht 64.0 in | Wt 166.0 lb

## 2017-01-07 DIAGNOSIS — N390 Urinary tract infection, site not specified: Secondary | ICD-10-CM | POA: Diagnosis not present

## 2017-01-07 DIAGNOSIS — E119 Type 2 diabetes mellitus without complications: Secondary | ICD-10-CM | POA: Diagnosis not present

## 2017-01-07 LAB — POC URINALSYSI DIPSTICK (AUTOMATED)
Bilirubin, UA: NEGATIVE
Glucose, UA: NEGATIVE
Ketones, UA: NEGATIVE
Nitrite, UA: POSITIVE
Protein, UA: NEGATIVE
Spec Grav, UA: 1.025 (ref 1.010–1.025)
Urobilinogen, UA: 0.2 E.U./dL
pH, UA: 6 (ref 5.0–8.0)

## 2017-01-07 MED ORDER — HYDROCODONE-ACETAMINOPHEN 10-325 MG PO TABS: 1.0000 | ORAL_TABLET | Freq: Four times a day (QID) | ORAL | 0 refills | Status: DC | PRN

## 2017-01-07 MED ORDER — ONDANSETRON 4 MG PO TBDP: 4.0000 mg | ORAL_TABLET | Freq: Three times a day (TID) | ORAL | 0 refills | Status: DC | PRN

## 2017-01-07 NOTE — Progress Notes (Signed)
   Subjective:    Patient ID: Wendy [name], female    DOB: 1945-11-23, 71 y.o.   MRN: [id number]  HPI Here to follow up an ER visit on 12-24-16 for a UTI. She was given IV fluids and was started on Bactrim DS. No culture was done on the urine sample. Since then she has felt better but is not all the way back to normal. There is no burning or fever or back pain, but she still feels a pressure to urinate.    Review of Systems  Constitutional: Negative.   Respiratory: Negative.   Cardiovascular: Negative.   Gastrointestinal: Negative.   Genitourinary: Positive for urgency. Negative for dysuria, flank pain, frequency and hematuria.       Objective:   Physical Exam  Constitutional: She appears well-developed and well-nourished.  Cardiovascular: Normal rate, regular rhythm, normal heart sounds and intact distal pulses.  Pulmonary/Chest: Effort normal and breath sounds normal. No respiratory distress. She has no wheezes. She has no rales.  Abdominal: Soft. Bowel sounds are normal. She exhibits no distension. There is no tenderness. There is no rebound.          Assessment & Plan:  UTI, which has been treated. We will repeat a UA today. Drink plenty of water. Her potassium in the ER was 3.1, so we will recheck a BMET today.  [name], MD

## 2017-01-07 NOTE — Patient Instructions (Signed)
WE NOW OFFER   [facility] FAST TRACK!!!  SAME DAY Appointments for ACUTE CARE  Such as: Sprains, Injuries, cuts, abrasions, rashes, muscle pain, joint pain, back pain Colds, flu, sore throats, headache, allergies, cough, fever  Ear pain, sinus and eye infections Abdominal pain, nausea, vomiting, diarrhea, upset stomach Animal/insect bites  3 Easy Ways to Schedule: Walk-In Scheduling Call in scheduling Mychart Sign-up: [email or web address]         

## 2017-01-08 LAB — BASIC METABOLIC PANEL
BUN: 19 mg/dL (ref 6–23)
CO2: 30 mEq/L (ref 19–32)
Calcium: 9.7 mg/dL (ref 8.4–10.5)
Chloride: 103 mEq/L (ref 96–112)
Creatinine, Ser: 1.1 mg/dL (ref 0.40–1.20)
GFR: 63.15 mL/min (ref >60.00)
Glucose, Bld: 114 mg/dL — ABNORMAL HIGH (ref 70–99)
Potassium: 4.3 mEq/L (ref 3.5–5.1)
Sodium: 139 mEq/L (ref 135–145)

## 2017-01-08 LAB — HEMOGLOBIN A1C: Hgb A1c MFr Bld: 6.8 % — ABNORMAL HIGH (ref 4.6–6.5)

## 2017-01-09 LAB — URINE CULTURE
MICRO NUMBER:: 81252697
SPECIMEN QUALITY:: ADEQUATE

## 2017-01-13 MED ORDER — CIPROFLOXACIN HCL 500 MG PO TABS: 500.0000 mg | ORAL_TABLET | Freq: Two times a day (BID) | ORAL | 0 refills | Status: DC

## 2017-01-13 NOTE — Addendum Note (Signed)
Addended by: Aggie Hacker A on: 01/13/2017 03:59 PM   Modules accepted: Orders

## 2017-02-13 ENCOUNTER — Other Ambulatory Visit: Payer: Self-pay

## 2017-02-13 MED ORDER — FLUTICASONE PROPIONATE 50 MCG/ACT NA SUSP: NASAL | 0 refills | Status: DC

## 2017-02-17 ENCOUNTER — Ambulatory Visit: Payer: Medicare HMO | Admitting: Family Medicine

## 2017-02-18 ENCOUNTER — Telehealth: Payer: Self-pay | Admitting: Family Medicine

## 2017-02-18 NOTE — Telephone Encounter (Signed)
Copied from [place] [phone]. Topic: Quick Communication - Rx Refill/Question >> Feb 18, 2017 10:34 AM Yvette Rack wrote: Has the patient contacted their pharmacy? No.   (Agent: If no, request that the patient contact the pharmacy for the refill.) HYDROcodone-acetaminophen ([place]) 10-325 MG tablet   Preferred Pharmacy (with phone number or street name): [id number] [address], [place] [phone] (Phone) [phone] (Fax)     Agent: Please be advised that RX refills may take up to 3 business days. We ask that you follow-up with your pharmacy.

## 2017-02-18 NOTE — Telephone Encounter (Signed)
Sent to PCP. Rx was last refilled 01/07/2017 disp 120, with no refills. Last OV

## 2017-02-18 NOTE — Telephone Encounter (Signed)
Sent to PCP. Rx was last refilled 01/07/2017 disp 120 with no refills. Last OV 01/07/2017.

## 2017-02-19 ENCOUNTER — Telehealth: Payer: Self-pay | Admitting: *Deleted

## 2017-02-19 DIAGNOSIS — E119 Type 2 diabetes mellitus without complications: Secondary | ICD-10-CM

## 2017-02-19 MED ORDER — HYDROCODONE-ACETAMINOPHEN 10-325 MG PO TABS: 1.0000 | ORAL_TABLET | Freq: Four times a day (QID) | ORAL | 0 refills | Status: DC | PRN

## 2017-02-19 NOTE — Telephone Encounter (Signed)
Pt notified of instructions and verbalized understanding. Patient has upcoming appt in January for pain mgmt.

## 2017-02-19 NOTE — Telephone Encounter (Signed)
[facility]N recommends patient be prescribed a statin due to having a diagnosis of diabetes. If you agree, please advise medication and I will contact patient and send to pharmacy.  Thanks! 

## 2017-02-19 NOTE — Telephone Encounter (Signed)
This was emailed in. She will need a PMV for any more

## 2017-02-20 ENCOUNTER — Emergency Department (HOSPITAL_COMMUNITY): Payer: Medicare HMO

## 2017-02-20 ENCOUNTER — Other Ambulatory Visit: Payer: Self-pay

## 2017-02-20 ENCOUNTER — Encounter (HOSPITAL_COMMUNITY): Payer: Self-pay

## 2017-02-20 ENCOUNTER — Emergency Department (HOSPITAL_COMMUNITY)
Admission: EM | Admit: 2017-02-20 | Discharge: 2017-02-20 | Disposition: A | Discharge status: Home / Self Care | Payer: Medicare HMO | Attending: Emergency Medicine | Admitting: Emergency Medicine

## 2017-02-20 DIAGNOSIS — M79632 Pain in left forearm: Secondary | ICD-10-CM | POA: Insufficient documentation

## 2017-02-20 DIAGNOSIS — W19XXXA Unspecified fall, initial encounter: Secondary | ICD-10-CM

## 2017-02-20 DIAGNOSIS — M79642 Pain in left hand: Secondary | ICD-10-CM | POA: Diagnosis not present

## 2017-02-20 DIAGNOSIS — M79602 Pain in left arm: Secondary | ICD-10-CM

## 2017-02-20 DIAGNOSIS — F17210 Nicotine dependence, cigarettes, uncomplicated: Secondary | ICD-10-CM | POA: Diagnosis not present

## 2017-02-20 DIAGNOSIS — S59912A Unspecified injury of left forearm, initial encounter: Secondary | ICD-10-CM | POA: Diagnosis not present

## 2017-02-20 DIAGNOSIS — S6992XA Unspecified injury of left wrist, hand and finger(s), initial encounter: Secondary | ICD-10-CM | POA: Diagnosis not present

## 2017-02-20 DIAGNOSIS — W1830XA Fall on same level, unspecified, initial encounter: Secondary | ICD-10-CM | POA: Insufficient documentation

## 2017-02-20 DIAGNOSIS — E119 Type 2 diabetes mellitus without complications: Secondary | ICD-10-CM | POA: Insufficient documentation

## 2017-02-20 DIAGNOSIS — M25532 Pain in left wrist: Secondary | ICD-10-CM | POA: Diagnosis not present

## 2017-02-20 DIAGNOSIS — Z79899 Other long term (current) drug therapy: Secondary | ICD-10-CM | POA: Insufficient documentation

## 2017-02-20 DIAGNOSIS — I10 Essential (primary) hypertension: Secondary | ICD-10-CM | POA: Diagnosis not present

## 2017-02-20 DIAGNOSIS — M25522 Pain in left elbow: Secondary | ICD-10-CM | POA: Diagnosis not present

## 2017-02-20 DIAGNOSIS — Y9301 Activity, walking, marching and hiking: Secondary | ICD-10-CM | POA: Insufficient documentation

## 2017-02-20 DIAGNOSIS — Y999 Unspecified external cause status: Secondary | ICD-10-CM | POA: Diagnosis not present

## 2017-02-20 DIAGNOSIS — Y92511 Restaurant or cafe as the place of occurrence of the external cause: Secondary | ICD-10-CM | POA: Diagnosis not present

## 2017-02-20 DIAGNOSIS — Z7984 Long term (current) use of oral hypoglycemic drugs: Secondary | ICD-10-CM | POA: Insufficient documentation

## 2017-02-20 DIAGNOSIS — S59902A Unspecified injury of left elbow, initial encounter: Secondary | ICD-10-CM | POA: Diagnosis not present

## 2017-02-20 MED ORDER — IBUPROFEN 800 MG PO TABS
800.0000 mg | ORAL_TABLET | Freq: Once | ORAL | Status: AC
  Administered 2017-02-20: 800 mg via ORAL
  Filled 2017-02-20: qty 1

## 2017-02-20 MED ORDER — LISINOPRIL 20 MG PO TABS
20.0000 mg | ORAL_TABLET | Freq: Once | ORAL | Status: AC
  Administered 2017-02-20: 20 mg via ORAL
  Filled 2017-02-20: qty 1

## 2017-02-20 MED ORDER — ATORVASTATIN CALCIUM 10 MG PO TABS: 10.0000 mg | ORAL_TABLET | Freq: Every day | ORAL | 3 refills | Status: DC

## 2017-02-20 MED ORDER — HYDROCHLOROTHIAZIDE 12.5 MG PO CAPS
25.0000 mg | ORAL_CAPSULE | Freq: Once | ORAL | Status: AC
  Administered 2017-02-20: 25 mg via ORAL
  Filled 2017-02-20: qty 2

## 2017-02-20 NOTE — ED Provider Notes (Signed)
[facility] DEPT Provider Note   CSN: [id number] Arrival date & time: 02/20/17  1702     History   Chief Complaint Chief Complaint  Patient presents with  . Fall  . Hand Pain  . hand swelling    HPI Wendy [name] is a 71 y.o. female with a history of hypertension, hepatitis C, and type 2 diabetes mellitus who presents to the emergency department status post mechanical fall this morning at 945 complaining of left upper extremity pain.  Patient states she was walking into a [employer], the floor was wet, and she slipped on some water.  Denies chest pain, shortness breath, dizziness, change in vision, headache, or lightheadedness prior to fall or at present.  States that she fell forward onto her knees and over onto her left hand which was outstretched.  No head injury or loss of consciousness.  Initially did not notice any discomfort, was offered EMS transport by [employer] staff, however patient declined and went to work.  States that started to have pain which is been progressively worsening.  Describes the pain as being to mid upper arm down to fingers in the left upper extremity.  States pain is worse with certain movements of the arm as well as with palpation.  No alleviating factors.  Has not tried any OTC interventions.  Also complaining of some tingling to the second through fourth digits.  Denies numbness, weakness, open wounds, neck pain, or back pain.  Of note patient did not take her prescribed medications this morning, specifically she has not had her blood pressure medication today and typically takes it in the morning. Patient is R hand dominant.   HPI  Past Medical History:  Diagnosis Date  . DEPENDENT EDEMA 07/21/2007  . DIABETES MELLITUS, TYPE II 11/05/2006  . FOOT PAIN 05/23/2009  . GERD 11/20/2006  . HEPATITIS C 02/08/2007   sees GI @ [facility] 11/05/2006  . LOW BACK PAIN SYNDROME 07/21/2007  . 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 Rheumatoid arthritis(714.0) 11/05/2006   sees Dr. Gavin Pound     Patient Active Problem List   Diagnosis Date Noted  . Diabetes mellitus without complication ([place]) 63/84/6659  . FOOT PAIN 05/23/2009  . ACUTE SINUSITIS, UNSPECIFIED 02/12/2009  . LOW BACK PAIN SYNDROME 07/21/2007  . DEPENDENT EDEMA 07/21/2007  . HEPATITIS C 02/08/2007  . GERD 11/20/2006  . CANDIDIASIS, VULVA/VAGINA 11/05/2006  . Diabetic neuropathy, type II diabetes mellitus ([place]) 11/05/2006  . Essential hypertension 11/05/2006  . GINGIVITIS, ACUTE, PLAQUE INDUCED 11/05/2006  . Rheumatoid arthritis ([place]) 11/05/2006  . 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 COLONOSCOPY  06-20-08   at [facility], clear   . ESOPHAGOGASTRODUODENOSCOPY  06-20-08   at [facility], clear   . high cholesterol    . LUMBAR LAMINECTOMY     1995    OB History    No data available       Home Medications    Prior to Admission medications   Medication Sig Start Date End Date Taking? Authorizing Provider  fluticasone (FLONASE) 50 MCG/ACT nasal spray PLACE 2 SPRAYS INTO BOTH NOSTRILS DAILY. 02/13/17  Yes Laurey Morale, MD  furosemide (LASIX) 20 MG tablet TAKE 1 TABLET (20 MG TOTAL) BY MOUTH DAILY AS NEEDED (FLUID RETENTION). 04/28/16  Yes Laurey Morale, MD  glipiZIDE (GLUCOTROL) 5 MG tablet Take 1 tablet (5 mg total) by mouth 2 (  two) times daily before a meal. Patient taking differently: Take 5 mg by mouth daily before breakfast.  04/18/16  Yes Laurey Morale, MD  hydroxychloroquine (PLAQUENIL) 200 MG tablet TAKE 1 TABLET (200 MG TOTAL) BY MOUTH DAILY. 01/09/15  Yes Laurey Morale, MD  lisinopril-hydrochlorothiazide (PRINZIDE,ZESTORETIC) 20-25 MG tablet TAKE 1 TABLET EVERY DAY 11/28/15  Yes Laurey Morale, MD  omeprazole (PRILOSEC) 40 MG capsule Take 1 capsule (40 mg total) by mouth daily. 07/20/13  Yes Laurey Morale, MD  ondansetron (ZOFRAN ODT) 4 MG disintegrating tablet Take  1 tablet (4 mg total) every 8 (eight) hours as needed by mouth for nausea or vomiting. 01/07/17  Yes Laurey Morale, MD  potassium chloride (K-DUR,KLOR-CON) 10 MEQ tablet Take 1 tablet (10 mEq total) by mouth 2 (two) times daily. Patient taking differently: Take 10 mEq by mouth daily.  02/05/16  Yes Laurey Morale, MD  ACCU-CHEK AVIVA PLUS test strip TEST ONCE DAILY  09/05/16   Laurey Morale, MD  atorvastatin (LIPITOR) 10 MG tablet Take 1 tablet (10 mg total) by mouth daily. 02/20/17   Laurey Morale, MD  CALCIUM-VITAMIN D PO Take 1 tablet by mouth daily as needed (supplemental).     [provider]  cetirizine (ZYRTEC) 10 MG tablet Take 1 tablet (10 mg total) by mouth daily. Patient not taking: Reported on 02/20/2017 01/10/15   Laurey Morale, MD  ciprofloxacin (CIPRO) 500 MG tablet Take 1 tablet (500 mg total) 2 (two) times daily by mouth. Patient not taking: Reported on 02/20/2017 01/13/17   Laurey Morale, MD  HYDROcodone-acetaminophen [facility]) 10-325 MG tablet Take 1 tablet by mouth every 6 (six) hours as needed for moderate pain. 02/19/17   Laurey Morale, MD  Lancets (ACCU-CHEK SOFT [facility]) lancets Test once per day and diagnosis code is E 11.9 and dispense for Aviva plus 03/23/14   Laurey Morale, MD    Family History Family History  Problem Relation Age of Onset  . Arthritis Unknown   . Diabetes Unknown   . Hypertension Unknown   . Cancer Unknown        lung    Social History Social History   Tobacco Use  . Smoking status: Current Some Day Smoker    Packs/day: 0.03    Years: 45.00    Pack years: 1.35    Types: Cigarettes  . Smokeless tobacco: Never Used  . Tobacco comment: still smokes   Substance Use Topics  . Alcohol use: Yes    Alcohol/week: 0.0 oz    Comment: occ  . Drug use: Yes    Types: Marijuana     Allergies   Aspirin and Tramadol   Review of Systems Review of Systems  Constitutional: Negative for chills and fever.  Eyes: Negative for visual  disturbance.  Respiratory: Negative for shortness of breath.   Cardiovascular: Negative for chest pain.  Gastrointestinal: Negative for abdominal pain.  Musculoskeletal: Positive for myalgias (LUE). Negative for back pain and neck pain.  Neurological: Negative for dizziness, syncope, weakness, light-headedness, numbness and headaches.       Positive for paresthesias to left digits 2-4  All other systems reviewed and are negative.    Physical Exam Updated Vital Signs BP (!) 209/102   Pulse 86   Temp 98.3 F (36.8 C) (Oral)   Resp 20   Ht 5\' 3"  (1.6 m)   Wt 74.8 kg (165 lb)   SpO2 100%   BMI 29.23 kg/m  Physical Exam  Constitutional: She appears well-developed and well-nourished.  Non-toxic appearance. No distress.  HENT:  Head: Normocephalic and atraumatic.  Eyes: Conjunctivae and EOM are normal. Pupils are equal, round, and reactive to light. Right eye exhibits no discharge. Left eye exhibits no discharge.  Neck: Normal range of motion. Neck supple. Muscular tenderness (left paraspinal tenderness and spasm to palpation) present. No spinous process tenderness present.  Cardiovascular: Normal rate and regular rhythm. Exam reveals no gallop and no friction rub.  No murmur heard. Pulses:      Radial pulses are 2+ on the right side, and 2+ on the left side.  Pulmonary/Chest: Effort normal. No respiratory distress. She has no wheezes. She has no rales.  Musculoskeletal:  Back: No vertebral tenderness.  Upper Extremities: No obvious deformity, erythema, warmth, or open wounds.  No appreciable swelling.  Patient has full range of motion at the shoulder and elbow joint bilaterally.  Left upper extremity full range of motion with wrist extension limited range of motion with wrist flexion secondary to pain.  Patient is able to move all left upper extremity digits, however unable to make a fist secondary to pain.  Patient with tenderness to palpation along the distal humerus, as well as the  distal forearm.  Tenderness palpation over the dorsal aspect of the radial side of the hand.  No snuffbox tenderness.  No bony tenderness to digits. no other bony tenderness noted Lower Extremities: No obvious deformity, erythema, warmth, or open wounds.  No appreciable swelling.  Full ROM.  No bony tenderness.  Neurological: She is alert.  Clear speech. CN III-XII grossly intact. Sensation intact to sharp and dull touch to bilateral upper and lower extremities.  Median/radial/ulnar function intact.  5 out of 5 strength with elbow flexion and extension. Grip strength difficult to assess secondary to pain. Gait is normal.   Skin: Capillary refill takes less than 2 seconds.  Psychiatric: She has a normal mood and affect. Her behavior is normal. Thought content normal.  Nursing note and vitals reviewed.   ED Treatments / Results  Labs (all labs ordered are listed, but only abnormal results are displayed) Labs Reviewed - No data to display  EKG  EKG Interpretation None       Radiology Dg Elbow Complete Left  Result Date: 02/20/2017 CLINICAL DATA:  Fall with elbow pain EXAM: LEFT ELBOW - COMPLETE 3+ VIEW COMPARISON:  None. FINDINGS: There is no evidence of fracture, dislocation, or joint effusion. There is no evidence of arthropathy or other focal bone abnormality. Soft tissues are unremarkable. IMPRESSION: Negative. Electronically Signed   By: Donavan Foil M.D.   On: 02/20/2017 21:31   Dg Forearm Left  Result Date: 02/20/2017 CLINICAL DATA:  Fall with pain EXAM: LEFT FOREARM - 2 VIEW COMPARISON:  None. FINDINGS: There is no evidence of fracture or other focal bone lesions. Soft tissues are unremarkable. IMPRESSION: Negative. Electronically Signed   By: Donavan Foil M.D.   On: 02/20/2017 21:30   Dg Wrist Complete Left  Result Date: 02/20/2017 CLINICAL DATA:  Fall with pain EXAM: LEFT WRIST - COMPLETE 3+ VIEW COMPARISON:  Report 08/03/2002 FINDINGS: No fracture or malalignment. No  radiopaque foreign body in the soft tissues. IMPRESSION: No acute osseous abnormality. Electronically Signed   By: Donavan Foil M.D.   On: 02/20/2017 21:33   Dg Hand Complete Left  Result Date: 02/20/2017 CLINICAL DATA:  Fall with pain EXAM: LEFT HAND - COMPLETE 3+ VIEW COMPARISON:  Report 01/20/2013 FINDINGS:  No fracture or malalignment.  Soft tissues are unremarkable. IMPRESSION: No acute osseous abnormality Electronically Signed   By: Donavan Foil M.D.   On: 02/20/2017 21:32    Procedures Procedures (including critical care time)  Medications Ordered in ED Medications  lisinopril (PRINIVIL,ZESTRIL) tablet 20 mg (not administered)  hydrochlorothiazide (MICROZIDE) capsule 25 mg (not administered)  ibuprofen (ADVIL,MOTRIN) tablet 800 mg (not administered)    Initial Impression / Assessment and Plan / ED Course  I have reviewed the triage vital signs and the nursing notes.  Pertinent labs & imaging results that were available during my care of the patient were reviewed by me and considered in my medical decision making (see chart for details).   Presents status post mechanical fall at 09:45 this AM complaining of left upper extremity pain.  She is nontoxic-appearing, no apparent distress, of note patient is hypertensive otherwise vital signs within normal limits.  Fall mechanical in nature on 2 outstretched left hand. No head injury, LOC, or neck/back pain, no midline tenderness. X-rays ordered consistent with areas of tenderness to LUE. NVI distally. X-Ray negative for obvious fracture or dislocation. Patient placed in a short arm splint and given ibuprofen, will treat supportively. Discussed follow up with PCP or orthopedic specialist in 1 week if persistent pain.  In regards to hypertension, suspect this is related to patient not taking her prescribed antihypertensive medicine this morning, will give a dose of prescribed medication here.  Patient denies headache, change in vision,  numbness, weakness, chest pain, dyspnea, dizziness, or lightheadedness therefore doubt hypertensive emergency. Discussed elevated blood pressure with the patient and the need for compliance with BP meds and need for primary care follow up with potential need to adjust antihypertensive medications and or for further evaluation. Discussed return precaution signs/symptoms for hypertensive emergency as listed above with the patient. She confirmed understanding.  I discussed results, treatment plan, need for PCP follow-up, and return precautions with the patient. Provided opportunity for questions, patient confirmed understanding and is in agreement with plan.   Findings and plan of care discussed with supervising physician Dr. Johnney Killian.  Final Clinical Impressions(s) / ED Diagnoses   Final diagnoses:  Fall, initial encounter  Left arm pain  Hypertension, unspecified type    ED Discharge Orders    None       Amaryllis Dyke, PA-C 02/20/17 2242    Charlesetta Shanks, MD 02/28/17 1041

## 2017-02-20 NOTE — ED Triage Notes (Signed)
Patient states she slid on a wet floor at [employer] this AM. Patient c/o left hand swelling and hand pain that radiates to the elbow.

## 2017-02-20 NOTE — Telephone Encounter (Signed)
Call in Lipitor 10 mg daily, #90 with 3 rf. Recheck lipids and a liver panel in 90 days

## 2017-02-20 NOTE — Telephone Encounter (Signed)
Pt is agreeable to starting lipitor. Medication filled to pharmacy as requested and future lab orders placed. She will schedule lab appt when she comes for appt in January.

## 2017-02-20 NOTE — ED Notes (Signed)
X-ray at bedside

## 2017-02-20 NOTE — Discharge Instructions (Signed)
Please read and follow all provided instructions.  You have been seen today for left upper extremity pain following a fall today.  Tests performed today include: Xrays of the affected area - do NOT show any broken bones or dislocations.  Vital signs. See below for your results today.   Home care instructions: -- *PRICE in the first 24-48 hours after injury: Protect (with brace, splint, sling), if given by your provider Rest Ice- Do not apply ice pack directly to your skin, place towel or similar between your skin and ice/ice pack. Apply ice for 20 min, then remove for 40 min while awake Compression- Wear brace, elastic bandage, splint as directed by your provider Elevate affected extremity above the level of your heart when not walking around for the first 24-48 hours   Use Ibuprofen (Motrin/Advil) 600mg  every 6 hours as needed for pain (do not exceed max dose in 24 hours, 2400mg ).  May also take your prescribed hydrocodone.  Follow-up instructions: Please follow-up with your primary care provider or the provided orthopedic physician (bone specialist) if you continue to have significant pain in 1 week. In this case you may have a more severe injury that requires further care.   Return instructions:  Please return if your toes or feet are numb or tingling, appear gray or blue, or you have severe pain (also elevate the leg and loosen splint or wrap if you were given one) Please return to the Emergency Department if you experience worsening symptoms.  Please return if you have any other emergent concerns. Additional Information:  Your vital signs today were: BP (!) 205/113 (BP Location: Left Arm) Comment: PA notified   Pulse 67    Temp 98.3 F (36.8 C) (Oral)    Resp 16    Ht 5\' 3"  (1.6 m)    Wt 74.8 kg (165 lb)    SpO2 100%    BMI 29.23 kg/m  Your blood pressure was elevated in the emergency department today.  I suspect this is due to not having taking your prescribed blood pressure  medication.  It is important that you take this every day.  He will need to follow-up with your primary care provider for reevaluation of your elevated blood pressure and for possible adjustment of your medications.  Return to the emergency department for any change in vision, headache, chest pain, difficulty breathing, lightheadedness, or dizziness. ---------------

## 2017-02-23 ENCOUNTER — Telehealth: Payer: Self-pay | Admitting: Family Medicine

## 2017-02-23 MED ORDER — ONDANSETRON 4 MG PO TBDP: 4.0000 mg | ORAL_TABLET | Freq: Three times a day (TID) | ORAL | 5 refills | Status: DC | PRN

## 2017-02-23 NOTE — Telephone Encounter (Signed)
Sent to PCP for approval. Rx was last refilled 01/07/2017 for disp 30 with no refills.

## 2017-02-23 NOTE — Telephone Encounter (Signed)
LOV 01/07/17 with Dr. Sarajane Jews / Medication refill for zofran /

## 2017-02-23 NOTE — Telephone Encounter (Signed)
Copied from [place] [phone]. Topic: Quick Communication - Rx Refill/Question >> Feb 23, 2017 10:47 AM Marin Olp L wrote: Has the patient contacted their pharmacy? No. (Agent: If no, request that the patient contact the pharmacy for the refill.) Preferred Pharmacy (with phone number or street name): [id number] [place], [place] Agent: Please be advised that RX refills may take up to 3 business days. We ask that you follow-up with your pharmacy. Refill ondansetron (ZOFRAN ODT) 4 MG disintegrating tablet

## 2017-02-23 NOTE — Telephone Encounter (Signed)
Done

## 2017-02-25 ENCOUNTER — Ambulatory Visit: Payer: Medicare HMO | Admitting: Family Medicine

## 2017-03-10 ENCOUNTER — Encounter: Payer: Self-pay | Admitting: Family Medicine

## 2017-03-10 ENCOUNTER — Ambulatory Visit: Payer: Medicare HMO | Admitting: Family Medicine

## 2017-03-10 ENCOUNTER — Ambulatory Visit (INDEPENDENT_AMBULATORY_CARE_PROVIDER_SITE_OTHER): Payer: Medicare HMO | Admitting: Family Medicine

## 2017-03-10 VITALS — BP 124/70 | Temp 98.2°F | Wt 164.8 lb

## 2017-03-10 DIAGNOSIS — G8929 Other chronic pain: Secondary | ICD-10-CM

## 2017-03-10 DIAGNOSIS — F1190 Opioid use, unspecified, uncomplicated: Secondary | ICD-10-CM | POA: Diagnosis not present

## 2017-03-10 DIAGNOSIS — E119 Type 2 diabetes mellitus without complications: Secondary | ICD-10-CM

## 2017-03-10 DIAGNOSIS — M5440 Lumbago with sciatica, unspecified side: Secondary | ICD-10-CM | POA: Diagnosis not present

## 2017-03-10 LAB — POCT GLYCOSYLATED HEMOGLOBIN (HGB A1C): Hemoglobin A1C: 6.5

## 2017-03-10 MED ORDER — LISINOPRIL-HYDROCHLOROTHIAZIDE 20-25 MG PO TABS: 1.0000 | ORAL_TABLET | Freq: Every day | ORAL | 3 refills | Status: DC

## 2017-03-10 MED ORDER — HYDROCODONE-ACETAMINOPHEN 10-325 MG PO TABS: 1.0000 | ORAL_TABLET | Freq: Four times a day (QID) | ORAL | 0 refills | Status: DC | PRN

## 2017-03-10 MED ORDER — FUROSEMIDE 20 MG PO TABS: 20.0000 mg | ORAL_TABLET | Freq: Every day | ORAL | 3 refills | Status: DC | PRN

## 2017-03-10 NOTE — Progress Notes (Signed)
   Subjective:    Patient ID: Wendy [name], female    DOB: June 12, 1945, 72 y.o.   MRN: [id number]  HPI Here for pain management. She is doing well in general.  Indication for chronic opioid: low back pain Medication and dose: Norco 10-325 # pills per month: 120 Last UDS date: 03-10-17 Pain contract signed (Y/N): 03-10-17 Date narcotic database last reviewed (include red flags): 03-10-17     Review of Systems  Constitutional: Negative.   Respiratory: Negative.   Cardiovascular: Negative.   Musculoskeletal: Positive for arthralgias and back pain.  Neurological: Negative.        Objective:   Physical Exam  Constitutional: She is oriented to person, place, and time. She appears well-developed and well-nourished.  Cardiovascular: Normal rate, regular rhythm, normal heart sounds and intact distal pulses.  Pulmonary/Chest: Effort normal and breath sounds normal. No respiratory distress. She has no wheezes. She has no rales.  Neurological: She is alert and oriented to person, place, and time.          Assessment & Plan:  Low back pain, stable. Meds were refilled.  [name], MD

## 2017-03-10 NOTE — Progress Notes (Signed)
done

## 2017-03-11 ENCOUNTER — Telehealth: Payer: Self-pay | Admitting: Family Medicine

## 2017-03-11 NOTE — Telephone Encounter (Signed)
Called pt and left a VM to call back.  

## 2017-03-11 NOTE — Telephone Encounter (Signed)
Sent to PCP ?

## 2017-03-11 NOTE — Telephone Encounter (Signed)
What is she talking about? Please get more info

## 2017-03-11 NOTE — Telephone Encounter (Signed)
Copied from [place] [phone]. Topic: Quick Communication - See Telephone Encounter >> Mar 11, 2017  9:37 AM Synthia Innocent wrote: CRM for notification. See Telephone encounter for:  Needs report from MD on arm pain, has to submit to insurance, a letter will work 03/11/17.

## 2017-03-12 NOTE — Telephone Encounter (Signed)
Patient calling back, seen on 03/10/17 with Dr Sarajane Jews, went to hospital 02/20/17 due to fall, had xray in hospital. Needs letter stating xray results and follow up guidelines.

## 2017-03-13 LAB — PAIN MGMT, PROFILE 8 W/CONF, U
6 Acetylmorphine: NEGATIVE ng/mL (ref <10)
Alcohol Metabolites: NEGATIVE ng/mL (ref <500)
Amphetamines: NEGATIVE ng/mL (ref <500)
Benzodiazepines: NEGATIVE ng/mL (ref <100)
Buprenorphine, Urine: NEGATIVE ng/mL (ref <5)
Cocaine Metabolite: NEGATIVE ng/mL (ref <150)
Codeine: NEGATIVE ng/mL (ref <50)
Creatinine: 99.8 mg/dL
Hydrocodone: 835 ng/mL — ABNORMAL HIGH (ref <50)
Hydromorphone: 343 ng/mL — ABNORMAL HIGH (ref <50)
MDMA: NEGATIVE ng/mL (ref <500)
Marijuana Metabolite: 90 ng/mL — ABNORMAL HIGH (ref <5)
Marijuana Metabolite: POSITIVE ng/mL — AB (ref <20)
Morphine: NEGATIVE ng/mL (ref <50)
Norhydrocodone: 1070 ng/mL — ABNORMAL HIGH (ref <50)
Opiates: POSITIVE ng/mL — AB (ref <100)
Oxidant: NEGATIVE ug/mL (ref <200)
Oxycodone: NEGATIVE ng/mL (ref <100)
pH: 4.97 (ref 4.5–9.0)

## 2017-03-13 NOTE — Telephone Encounter (Signed)
The letter is ready for pickup  

## 2017-03-13 NOTE — Telephone Encounter (Signed)
Called and spoke with the pt. Pt advised letter is done and has been placed up front for pick up.

## 2017-03-13 NOTE — Telephone Encounter (Signed)
Sent to PCP ?

## 2017-03-14 ENCOUNTER — Other Ambulatory Visit: Payer: Self-pay | Admitting: Family Medicine

## 2017-04-14 ENCOUNTER — Telehealth: Payer: Self-pay | Admitting: Family Medicine

## 2017-04-14 NOTE — Telephone Encounter (Signed)
gCopied from [place] [phone]. Topic: Quick Communication - Rx Refill/Question >> Apr 14, 2017  2:04 PM Bea Graff, NT wrote: Medication: hydroxychloroquine (PLAQUENIL)    Has the patient contacted their pharmacy? Yes.     (Agent: If no, request that the patient contact the pharmacy for the refill.)   [facility] phone number [facility] [address]   Agent: Please be advised that RX refills may take up to 3 business days. We ask that you follow-up with your pharmacy.

## 2017-04-14 NOTE — Telephone Encounter (Signed)
Plaquenil 20mg  refill Last OV: 03/10/17 with Dr. Sarajane Jews Last Refill:01/09/2015 #90 [facility] [address]

## 2017-04-15 MED ORDER — HYDROXYCHLOROQUINE SULFATE 200 MG PO TABS: ORAL_TABLET | ORAL | 3 refills | Status: DC

## 2017-04-15 NOTE — Telephone Encounter (Signed)
Call in #90 with 3 rf  

## 2017-04-15 NOTE — Telephone Encounter (Signed)
Sent to PCP for approval.  

## 2017-05-19 DIAGNOSIS — D485 Neoplasm of uncertain behavior of skin: Secondary | ICD-10-CM | POA: Diagnosis not present

## 2017-05-20 ENCOUNTER — Other Ambulatory Visit: Payer: Self-pay | Admitting: Family Medicine

## 2017-06-09 ENCOUNTER — Encounter: Payer: Self-pay | Admitting: Family Medicine

## 2017-06-09 ENCOUNTER — Ambulatory Visit (INDEPENDENT_AMBULATORY_CARE_PROVIDER_SITE_OTHER): Payer: Medicare HMO | Admitting: Family Medicine

## 2017-06-09 VITALS — BP 160/90 | HR 76 | Temp 98.7°F | Ht 63.0 in | Wt 164.4 lb

## 2017-06-09 DIAGNOSIS — F1190 Opioid use, unspecified, uncomplicated: Secondary | ICD-10-CM | POA: Diagnosis not present

## 2017-06-09 DIAGNOSIS — G8929 Other chronic pain: Secondary | ICD-10-CM

## 2017-06-09 DIAGNOSIS — M5440 Lumbago with sciatica, unspecified side: Secondary | ICD-10-CM

## 2017-06-09 DIAGNOSIS — R319 Hematuria, unspecified: Secondary | ICD-10-CM

## 2017-06-09 LAB — POCT URINALYSIS DIPSTICK
Bilirubin, UA: NEGATIVE
Glucose, UA: NEGATIVE
Ketones, UA: NEGATIVE
Leukocytes, UA: NEGATIVE
Nitrite, UA: NEGATIVE
Protein, UA: NEGATIVE
Spec Grav, UA: >=1.03 — AB (ref 1.010–1.025)
Urobilinogen, UA: 0.2 E.U./dL
pH, UA: 6 (ref 5.0–8.0)

## 2017-06-09 MED ORDER — HYDROCODONE-ACETAMINOPHEN 10-325 MG PO TABS: 1.0000 | ORAL_TABLET | Freq: Four times a day (QID) | ORAL | 0 refills | Status: DC | PRN

## 2017-06-09 NOTE — Progress Notes (Signed)
   Subjective:    Patient ID: Wendy [name], female    DOB: Dec 08, 1945, 72 y.o.   MRN: [id number]  HPI Here for a pain management visit. Her low back pain has been getting worse over the past month or so. The last time she saw her neurosurgeon, Dr. Aubery Lapping, was in 2015.  Indication for chronic opioid: low back pain Medication and dose: Norco 10-325 # pills per month: 120 Last UDS date: 03-10-17 Opioid Treatment Agreement signed (Y/N): 06-09-17 Opioid Treatment Agreement last reviewed with patient:  06-09-17 [place] reviewed this encounter (include red flags):  06-09-17   Review of Systems  Constitutional: Negative.   Respiratory: Negative.   Cardiovascular: Negative.   Musculoskeletal: Positive for back pain.  Neurological: Negative.        Objective:   Physical Exam  Constitutional: She is oriented to person, place, and time. She appears well-developed and well-nourished.  Cardiovascular: Normal rate, regular rhythm, normal heart sounds and intact distal pulses.  Pulmonary/Chest: Effort normal and breath sounds normal. No respiratory distress. She has no wheezes. She has no rales.  Neurological: She is alert and oriented to person, place, and time.          Assessment & Plan:  Pain management. Meds were refilled. We will arrange for her to see Dr. Aubery Lapping again soon. Also she had 2+ blood in the urine sample today with no urinary symptoms. We will send this for a culture. If this is negative she will return to repeat a UA in a week or two.  [name], MD

## 2017-06-09 NOTE — Addendum Note (Signed)
Addended by: Myriam Forehand on: 06/09/2017 03:42 PM   Modules accepted: Orders

## 2017-06-10 LAB — URINE CULTURE
MICRO NUMBER:: 90436326
Result:: NO GROWTH
SPECIMEN QUALITY:: ADEQUATE

## 2017-06-16 DIAGNOSIS — L570 Actinic keratosis: Secondary | ICD-10-CM | POA: Diagnosis not present

## 2017-06-20 ENCOUNTER — Other Ambulatory Visit: Payer: Self-pay | Admitting: Family Medicine

## 2017-08-03 DIAGNOSIS — M5441 Lumbago with sciatica, right side: Secondary | ICD-10-CM | POA: Diagnosis not present

## 2017-08-03 DIAGNOSIS — M79661 Pain in right lower leg: Secondary | ICD-10-CM | POA: Diagnosis not present

## 2017-08-03 DIAGNOSIS — M4316 Spondylolisthesis, lumbar region: Secondary | ICD-10-CM | POA: Diagnosis not present

## 2017-08-03 DIAGNOSIS — G8929 Other chronic pain: Secondary | ICD-10-CM | POA: Diagnosis not present

## 2017-08-03 DIAGNOSIS — Z981 Arthrodesis status: Secondary | ICD-10-CM | POA: Diagnosis not present

## 2017-08-03 DIAGNOSIS — F40240 Claustrophobia: Secondary | ICD-10-CM | POA: Diagnosis not present

## 2017-08-03 DIAGNOSIS — M5416 Radiculopathy, lumbar region: Secondary | ICD-10-CM | POA: Diagnosis not present

## 2017-08-07 ENCOUNTER — Other Ambulatory Visit: Payer: Self-pay | Admitting: Neurosurgery

## 2017-08-07 DIAGNOSIS — G8929 Other chronic pain: Secondary | ICD-10-CM

## 2017-08-07 DIAGNOSIS — M5416 Radiculopathy, lumbar region: Secondary | ICD-10-CM

## 2017-08-07 DIAGNOSIS — M5441 Lumbago with sciatica, right side: Secondary | ICD-10-CM

## 2017-08-07 DIAGNOSIS — Z981 Arthrodesis status: Secondary | ICD-10-CM

## 2017-08-17 ENCOUNTER — Other Ambulatory Visit: Payer: Medicare HMO

## 2017-08-27 ENCOUNTER — Ambulatory Visit
Admission: RE | Admit: 2017-08-27 | Discharge: 2017-08-27 | Disposition: A | Discharge status: Home / Self Care | Payer: Medicare HMO | Source: Ambulatory Visit | Attending: Neurosurgery | Admitting: Neurosurgery

## 2017-08-27 ENCOUNTER — Other Ambulatory Visit: Payer: Self-pay | Admitting: Neurosurgery

## 2017-08-27 DIAGNOSIS — M5440 Lumbago with sciatica, unspecified side: Secondary | ICD-10-CM

## 2017-08-27 DIAGNOSIS — Z981 Arthrodesis status: Secondary | ICD-10-CM

## 2017-08-27 DIAGNOSIS — M47816 Spondylosis without myelopathy or radiculopathy, lumbar region: Secondary | ICD-10-CM | POA: Diagnosis not present

## 2017-08-27 DIAGNOSIS — M4310 Spondylolisthesis, site unspecified: Secondary | ICD-10-CM

## 2017-08-27 DIAGNOSIS — M5416 Radiculopathy, lumbar region: Secondary | ICD-10-CM

## 2017-08-27 DIAGNOSIS — M48061 Spinal stenosis, lumbar region without neurogenic claudication: Secondary | ICD-10-CM | POA: Diagnosis not present

## 2017-08-27 DIAGNOSIS — G8929 Other chronic pain: Secondary | ICD-10-CM

## 2017-08-27 DIAGNOSIS — M5136 Other intervertebral disc degeneration, lumbar region: Secondary | ICD-10-CM | POA: Diagnosis not present

## 2017-08-27 DIAGNOSIS — M5441 Lumbago with sciatica, right side: Secondary | ICD-10-CM

## 2017-08-27 DIAGNOSIS — M5126 Other intervertebral disc displacement, lumbar region: Secondary | ICD-10-CM | POA: Diagnosis not present

## 2017-08-27 DIAGNOSIS — M4326 Fusion of spine, lumbar region: Secondary | ICD-10-CM | POA: Diagnosis not present

## 2017-08-27 MED ORDER — GADOBENATE DIMEGLUMINE 529 MG/ML IV SOLN
15.0000 mL | Freq: Once | INTRAVENOUS | Status: AC | PRN
  Administered 2017-08-27: 15 mL via INTRAVENOUS

## 2017-09-08 DIAGNOSIS — G8929 Other chronic pain: Secondary | ICD-10-CM | POA: Diagnosis not present

## 2017-09-08 DIAGNOSIS — M5441 Lumbago with sciatica, right side: Secondary | ICD-10-CM | POA: Diagnosis not present

## 2017-09-08 DIAGNOSIS — Z981 Arthrodesis status: Secondary | ICD-10-CM | POA: Diagnosis not present

## 2017-09-08 DIAGNOSIS — M4316 Spondylolisthesis, lumbar region: Secondary | ICD-10-CM | POA: Diagnosis not present

## 2017-09-11 ENCOUNTER — Ambulatory Visit (INDEPENDENT_AMBULATORY_CARE_PROVIDER_SITE_OTHER): Payer: Medicare HMO | Admitting: Family Medicine

## 2017-09-11 ENCOUNTER — Encounter: Payer: Self-pay | Admitting: Family Medicine

## 2017-09-11 VITALS — BP 132/78 | HR 71 | Temp 98.8°F | Ht 63.0 in | Wt 163.6 lb

## 2017-09-11 DIAGNOSIS — F1190 Opioid use, unspecified, uncomplicated: Secondary | ICD-10-CM

## 2017-09-11 DIAGNOSIS — M5440 Lumbago with sciatica, unspecified side: Secondary | ICD-10-CM

## 2017-09-11 DIAGNOSIS — G8929 Other chronic pain: Secondary | ICD-10-CM | POA: Diagnosis not present

## 2017-09-11 MED ORDER — HYDROCODONE-ACETAMINOPHEN 10-325 MG PO TABS: 1.0000 | ORAL_TABLET | Freq: Four times a day (QID) | ORAL | 0 refills | Status: DC | PRN

## 2017-09-11 NOTE — Progress Notes (Signed)
   Subjective:    Patient ID: Wendy [name], female    DOB: 15-Dec-1945, 72 y.o.   MRN: [id number]  HPI Here for pain management. She is doing well. She saw the Neurosurgery clinic a few days ago and they are considering referring her for possible steroid injections or radiofrequency ablations.  Indication for chronic opioid: low back pain Medication and dose: Norco 10-325 # pills per month: 120  Last UDS date: 03-10-17 Opioid Treatment Agreement signed (Y/N): 06-09-17 Opioid Treatment Agreement last reviewed with patient:  09-11-17 [facility] reviewed this encounter (include red flags):  09-11-17    Review of Systems  Constitutional: Negative.   Respiratory: Negative.   Cardiovascular: Negative.   Musculoskeletal: Positive for back pain.  Neurological: Negative.        Objective:   Physical Exam  Constitutional: She is oriented to person, place, and time. She appears well-developed and well-nourished.  Cardiovascular: Normal rate, regular rhythm, normal heart sounds and intact distal pulses.  Pulmonary/Chest: Effort normal and breath sounds normal.  Neurological: She is alert and oriented to person, place, and time.          Assessment & Plan:  Pain management, meds were refilled.  [name], MD

## 2017-09-18 ENCOUNTER — Other Ambulatory Visit: Payer: Self-pay | Admitting: Family Medicine

## 2017-09-18 NOTE — Telephone Encounter (Signed)
Last OV 09/11/2017   Last refilled 06/22/2017 disp 60 with 2 refills   Sent to PCP for approval

## 2017-09-23 DIAGNOSIS — M4316 Spondylolisthesis, lumbar region: Secondary | ICD-10-CM | POA: Diagnosis not present

## 2017-09-23 DIAGNOSIS — Z981 Arthrodesis status: Secondary | ICD-10-CM | POA: Diagnosis not present

## 2017-09-23 DIAGNOSIS — G8929 Other chronic pain: Secondary | ICD-10-CM | POA: Diagnosis not present

## 2017-09-23 DIAGNOSIS — M5441 Lumbago with sciatica, right side: Secondary | ICD-10-CM | POA: Diagnosis not present

## 2017-09-23 DIAGNOSIS — M5416 Radiculopathy, lumbar region: Secondary | ICD-10-CM | POA: Diagnosis not present

## 2017-10-06 DIAGNOSIS — M5441 Lumbago with sciatica, right side: Secondary | ICD-10-CM | POA: Diagnosis not present

## 2017-10-06 DIAGNOSIS — Z7409 Other reduced mobility: Secondary | ICD-10-CM | POA: Diagnosis not present

## 2017-10-06 DIAGNOSIS — M5380 Other specified dorsopathies, site unspecified: Secondary | ICD-10-CM | POA: Diagnosis not present

## 2017-10-06 DIAGNOSIS — G8929 Other chronic pain: Secondary | ICD-10-CM | POA: Diagnosis not present

## 2017-10-06 DIAGNOSIS — M4316 Spondylolisthesis, lumbar region: Secondary | ICD-10-CM | POA: Diagnosis not present

## 2017-10-07 DIAGNOSIS — M47816 Spondylosis without myelopathy or radiculopathy, lumbar region: Secondary | ICD-10-CM | POA: Diagnosis not present

## 2017-10-21 DIAGNOSIS — L570 Actinic keratosis: Secondary | ICD-10-CM | POA: Diagnosis not present

## 2017-10-21 DIAGNOSIS — Z79891 Long term (current) use of opiate analgesic: Secondary | ICD-10-CM | POA: Diagnosis not present

## 2017-10-21 DIAGNOSIS — M47816 Spondylosis without myelopathy or radiculopathy, lumbar region: Secondary | ICD-10-CM | POA: Diagnosis not present

## 2017-11-11 DIAGNOSIS — M47816 Spondylosis without myelopathy or radiculopathy, lumbar region: Secondary | ICD-10-CM | POA: Diagnosis not present

## 2017-12-02 DIAGNOSIS — M47816 Spondylosis without myelopathy or radiculopathy, lumbar region: Secondary | ICD-10-CM | POA: Diagnosis not present

## 2017-12-21 ENCOUNTER — Encounter: Payer: Self-pay | Admitting: Family Medicine

## 2017-12-21 ENCOUNTER — Ambulatory Visit (INDEPENDENT_AMBULATORY_CARE_PROVIDER_SITE_OTHER): Payer: Medicare HMO | Admitting: Family Medicine

## 2017-12-21 VITALS — BP 120/82 | HR 77 | Temp 98.6°F | Wt 161.5 lb

## 2017-12-21 DIAGNOSIS — Z23 Encounter for immunization: Secondary | ICD-10-CM

## 2017-12-21 DIAGNOSIS — G8929 Other chronic pain: Secondary | ICD-10-CM

## 2017-12-21 DIAGNOSIS — M5440 Lumbago with sciatica, unspecified side: Secondary | ICD-10-CM | POA: Diagnosis not present

## 2017-12-21 DIAGNOSIS — F1190 Opioid use, unspecified, uncomplicated: Secondary | ICD-10-CM

## 2017-12-21 MED ORDER — HYDROCODONE-ACETAMINOPHEN 10-325 MG PO TABS: 1.0000 | ORAL_TABLET | Freq: Four times a day (QID) | ORAL | 0 refills | Status: DC | PRN

## 2017-12-21 MED ORDER — ALPRAZOLAM 1 MG PO TABS: 1.0000 mg | ORAL_TABLET | Freq: Every evening | ORAL | 2 refills | Status: DC | PRN

## 2017-12-21 MED ORDER — ONDANSETRON 4 MG PO TBDP: 4.0000 mg | ORAL_TABLET | Freq: Three times a day (TID) | ORAL | 5 refills | Status: DC | PRN

## 2017-12-21 NOTE — Progress Notes (Signed)
   Subjective:    Patient ID: Wendy [name], female    DOB: 1945/04/06, 72 y.o.   MRN: [id number]  HPI Here for pain management. She is doing well and she has had an ablation to her spine. She does have trouble sleeping however.  Indication for chronic opioid: low back pain  Medication and dose: Norco 10-325  # pills per month: 120 Last UDS date: 03-10-17 Opioid Treatment Agreement signed (Y/N): 06-09-17 Opioid Treatment Agreement last reviewed with patient:  12-21-17 [facility] reviewed this encounter (include red flags):  12-21-17    Review of Systems  Constitutional: Negative.   Respiratory: Negative.   Cardiovascular: Negative.   Musculoskeletal: Positive for back pain.  Neurological: Negative.        Objective:   Physical Exam  Constitutional: She is oriented to person, place, and time. She appears well-developed and well-nourished.  Cardiovascular: Normal rate, regular rhythm, normal heart sounds and intact distal pulses.  Pulmonary/Chest: Effort normal and breath sounds normal.  Neurological: She is alert and oriented to person, place, and time.          Assessment & Plan:  Pain management, meds were refilled. Try Xanax for sleep.  [name], MD

## 2017-12-31 DIAGNOSIS — M47816 Spondylosis without myelopathy or radiculopathy, lumbar region: Secondary | ICD-10-CM | POA: Diagnosis not present

## 2017-12-31 DIAGNOSIS — Z981 Arthrodesis status: Secondary | ICD-10-CM | POA: Diagnosis not present

## 2018-01-26 ENCOUNTER — Telehealth: Payer: Self-pay | Admitting: *Deleted

## 2018-01-26 NOTE — Telephone Encounter (Signed)
Copied from [place] [phone]. Topic: General - Other >> Jan 21, 2018  8:36 AM Judyann Munson wrote: Reason for CRM: patient is calling to state she has a appt on Wednesday at [place] on 01-27-18. She stated she is needing a order faxed over for her Bone Density test  to be completed   Dr. Sarajane Jews please advise on bone density order.

## 2018-01-26 NOTE — Telephone Encounter (Signed)
The order is ready to fax to [facility]

## 2018-01-27 ENCOUNTER — Encounter: Payer: Self-pay | Admitting: Family Medicine

## 2018-01-27 DIAGNOSIS — E119 Type 2 diabetes mellitus without complications: Secondary | ICD-10-CM | POA: Diagnosis not present

## 2018-01-27 DIAGNOSIS — Z780 Asymptomatic menopausal state: Secondary | ICD-10-CM | POA: Diagnosis not present

## 2018-01-27 DIAGNOSIS — E2839 Other primary ovarian failure: Secondary | ICD-10-CM | POA: Diagnosis not present

## 2018-01-27 DIAGNOSIS — F17200 Nicotine dependence, unspecified, uncomplicated: Secondary | ICD-10-CM | POA: Diagnosis not present

## 2018-01-27 DIAGNOSIS — Z1231 Encounter for screening mammogram for malignant neoplasm of breast: Secondary | ICD-10-CM | POA: Diagnosis not present

## 2018-01-27 DIAGNOSIS — M069 Rheumatoid arthritis, unspecified: Secondary | ICD-10-CM | POA: Diagnosis not present

## 2018-01-27 NOTE — Telephone Encounter (Signed)
Order has been faxed for the pt.  I have contacted the pt and she is aware.

## 2018-02-17 ENCOUNTER — Other Ambulatory Visit: Payer: Self-pay | Admitting: Family Medicine

## 2018-02-17 NOTE — Telephone Encounter (Signed)
Dr. Sarajane Jews please advise on the refill of med.  Does she need CPX?

## 2018-03-20 ENCOUNTER — Other Ambulatory Visit: Payer: Self-pay | Admitting: Family Medicine

## 2018-03-23 ENCOUNTER — Encounter: Payer: Self-pay | Admitting: Family Medicine

## 2018-03-23 ENCOUNTER — Ambulatory Visit (INDEPENDENT_AMBULATORY_CARE_PROVIDER_SITE_OTHER): Payer: Medicare HMO | Admitting: Family Medicine

## 2018-03-23 VITALS — BP 146/94 | HR 82 | Temp 98.3°F | Wt 165.5 lb

## 2018-03-23 DIAGNOSIS — G8929 Other chronic pain: Secondary | ICD-10-CM | POA: Diagnosis not present

## 2018-03-23 DIAGNOSIS — M5440 Lumbago with sciatica, unspecified side: Secondary | ICD-10-CM

## 2018-03-23 DIAGNOSIS — F1120 Opioid dependence, uncomplicated: Secondary | ICD-10-CM | POA: Diagnosis not present

## 2018-03-23 DIAGNOSIS — F1190 Opioid use, unspecified, uncomplicated: Secondary | ICD-10-CM

## 2018-03-23 DIAGNOSIS — E1140 Type 2 diabetes mellitus with diabetic neuropathy, unspecified: Secondary | ICD-10-CM

## 2018-03-23 MED ORDER — HYDROCODONE-ACETAMINOPHEN 10-325 MG PO TABS: 1.0000 | ORAL_TABLET | Freq: Four times a day (QID) | ORAL | 0 refills | Status: DC | PRN

## 2018-03-23 MED ORDER — GABAPENTIN 100 MG PO CAPS: 100.0000 mg | ORAL_CAPSULE | Freq: Two times a day (BID) | ORAL | 5 refills | Status: DC

## 2018-03-23 MED ORDER — POTASSIUM CHLORIDE CRYS ER 10 MEQ PO TBCR: 10.0000 meq | EXTENDED_RELEASE_TABLET | Freq: Every day | ORAL | 3 refills | Status: DC

## 2018-03-23 NOTE — Telephone Encounter (Signed)
Dr Sarajane Jews please advise if the refills of the lasix and the lisinopril/hctz need to be sent in  Together.  Thanks

## 2018-03-23 NOTE — Progress Notes (Signed)
   Subjective:    Patient ID: Wendy [name], female    DOB: Oct 13, 1945, 73 y.o.   MRN: [id number]  HPI Here for pain management. Her back pain is stable. She does complain of burning pains with tingling in both feet however for the past few months.  Indication for chronic opioid: low back pain  Medication and dose: Norco 10-325  # pills per month: 120 Last UDS date: 03-23-18 Opioid Treatment Agreement signed (Y/N): 06-09-17 Opioid Treatment Agreement last reviewed with patient:  03-23-18 NCCSRS reviewed this encounter (include red flags):  03-23-18   Review of Systems  Constitutional: Negative.   Respiratory: Negative.   Cardiovascular: Negative.   Musculoskeletal: Positive for back pain.  Neurological: Positive for numbness.       Objective:   Physical Exam Constitutional:      Appearance: Normal appearance.  Cardiovascular:     Rate and Rhythm: Normal rate and regular rhythm.     Pulses: Normal pulses.     Heart sounds: Normal heart sounds.  Pulmonary:     Effort: Pulmonary effort is normal.     Breath sounds: Normal breath sounds.  Neurological:     General: No focal deficit present.     Mental Status: She is alert and oriented to person, place, and time.           Assessment & Plan:  Her low back pain is stable. The Norco was refilled. She also has diabetic neuropathy so she will try Gabapentin 100 mg bid.  [name], MD

## 2018-03-26 LAB — PAIN MGMT, PROFILE 8 W/CONF, U
6 Acetylmorphine: NEGATIVE ng/mL (ref <10)
Alcohol Metabolites: NEGATIVE ng/mL (ref <500)
Amphetamines: NEGATIVE ng/mL (ref <500)
Benzodiazepines: NEGATIVE ng/mL (ref <100)
Buprenorphine, Urine: NEGATIVE ng/mL (ref <5)
Cocaine Metabolite: NEGATIVE ng/mL (ref <150)
Codeine: NEGATIVE ng/mL (ref <50)
Creatinine: 126.2 mg/dL
Hydrocodone: 232 ng/mL — ABNORMAL HIGH (ref <50)
Hydromorphone: 336 ng/mL — ABNORMAL HIGH (ref <50)
MDMA: NEGATIVE ng/mL (ref <500)
Marijuana Metabolite: 186 ng/mL — ABNORMAL HIGH (ref <5)
Marijuana Metabolite: POSITIVE ng/mL — AB (ref <20)
Morphine: NEGATIVE ng/mL (ref <50)
Norhydrocodone: 963 ng/mL — ABNORMAL HIGH (ref <50)
Opiates: POSITIVE ng/mL — AB (ref <100)
Oxidant: NEGATIVE ug/mL (ref <200)
Oxycodone: NEGATIVE ng/mL (ref <100)
pH: 6.86 (ref 4.5–9.0)

## 2018-04-17 ENCOUNTER — Other Ambulatory Visit: Payer: Self-pay | Admitting: Family Medicine

## 2018-04-19 ENCOUNTER — Other Ambulatory Visit: Payer: Self-pay | Admitting: Family Medicine

## 2018-05-19 ENCOUNTER — Other Ambulatory Visit: Payer: Self-pay | Admitting: Family Medicine

## 2018-06-02 ENCOUNTER — Telehealth: Payer: Self-pay | Admitting: Family Medicine

## 2018-06-02 NOTE — Telephone Encounter (Signed)
Copied from [place] [phone]. Topic: General - Other >> Jun 02, 2018  4:37 PM Keene Breath wrote: Reason for CRM: Patient called to inquire about her 3 month appt. For pain management.  Please advise and call patient to schedule.  CB# [phone]

## 2018-06-03 NOTE — Telephone Encounter (Signed)
Called and spoke with pt and she stated that she has a phone that she could use and she will have to get someone to help her with that.  She will call back to set up with appt.

## 2018-06-10 ENCOUNTER — Ambulatory Visit (INDEPENDENT_AMBULATORY_CARE_PROVIDER_SITE_OTHER): Payer: Medicare HMO | Admitting: Family Medicine

## 2018-06-10 ENCOUNTER — Encounter: Payer: Self-pay | Admitting: Family Medicine

## 2018-06-10 ENCOUNTER — Other Ambulatory Visit: Payer: Self-pay

## 2018-06-10 DIAGNOSIS — E119 Type 2 diabetes mellitus without complications: Secondary | ICD-10-CM | POA: Diagnosis not present

## 2018-06-10 DIAGNOSIS — G8929 Other chronic pain: Secondary | ICD-10-CM | POA: Diagnosis not present

## 2018-06-10 DIAGNOSIS — F1190 Opioid use, unspecified, uncomplicated: Secondary | ICD-10-CM | POA: Diagnosis not present

## 2018-06-10 DIAGNOSIS — M069 Rheumatoid arthritis, unspecified: Secondary | ICD-10-CM | POA: Diagnosis not present

## 2018-06-10 DIAGNOSIS — M5440 Lumbago with sciatica, unspecified side: Secondary | ICD-10-CM

## 2018-06-10 MED ORDER — HYDROCODONE-ACETAMINOPHEN 10-325 MG PO TABS: 1.0000 | ORAL_TABLET | Freq: Four times a day (QID) | ORAL | 0 refills | Status: DC | PRN

## 2018-06-10 NOTE — Progress Notes (Signed)
Subjective:    Patient ID: Wendy [name], female    DOB: 1945/10/18, 73 y.o.   MRN: [id number]  HPI Virtual Visit via Video Note  I connected with the patient on 06/10/18 at 10:45 AM EDT by a video enabled telemedicine application and verified that I am speaking with the correct person using two identifiers.  Location patient: home Location provider:work or home office Persons participating in the virtual visit: patient, provider  I discussed the limitations of evaluation and management by telemedicine and the availability of in person appointments. The patient expressed understanding and agreed to proceed.   HPI: Here for pain management. She has been doing well. 3 months ago we added Gabapentin and this has been helpful.  Indication for chronic opioid: low back pain Medication and dose: Norco 10-325 # pills per month: 120 Last UDS date: 03-23-18 Opioid Treatment Agreement signed (Y/N): 06-19-17 Opioid Treatment Agreement last reviewed with patient:  06-10-18 NCCSRS reviewed this encounter (include red flags):  06-10-18    ROS: See pertinent positives and negatives per HPI.  Past Medical History:  Diagnosis Date  . DEPENDENT EDEMA 07/21/2007  . DIABETES MELLITUS, TYPE II 11/05/2006  . FOOT PAIN 05/23/2009  . GERD 11/20/2006  . HEPATITIS C 02/08/2007   sees GI @ [place] 11/05/2006  . LOW BACK PAIN SYNDROME 07/21/2007  . 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 Rheumatoid arthritis(714.0) 11/05/2006   sees Dr. Gavin Pound     Past Surgical History:  Procedure Laterality Date  . COLONOSCOPY  06-20-08   at [facility], clear   . ESOPHAGOGASTRODUODENOSCOPY  06-20-08   at [facility], clear   . high cholesterol    . LUMBAR LAMINECTOMY     1995    Family History  Problem Relation Age of Onset  . Arthritis Unknown   . Diabetes Unknown   . Hypertension Unknown   . Cancer Unknown        lung     Current Outpatient Medications:  .  ACCU-CHEK  AVIVA PLUS test strip, TEST ONCE DAILY , Disp: 100 each, Rfl: 0 .  ALPRAZolam (XANAX) 1 MG tablet, Take 1 tablet (1 mg total) by mouth at bedtime as needed for anxiety or sleep., Disp: 30 tablet, Rfl: 2 .  atorvastatin (LIPITOR) 10 MG tablet, TAKE 1 TABLET BY MOUTH EVERY DAY, Disp: 90 tablet, Rfl: 3 .  CALCIUM-VITAMIN D PO, Take 1 tablet by mouth daily as needed (supplemental). , Disp: , Rfl:  .  fluticasone (FLONASE) 50 MCG/ACT nasal spray, SPRAY 2 SPRAYS INTO EACH NOSTRIL EVERY DAY, Disp: 48 g, Rfl: 2 .  furosemide (LASIX) 20 MG tablet, TAKE 1 TABLET (20 MG TOTAL) BY MOUTH DAILY AS NEEDED (FLUID RETENTION)., Disp: 90 tablet, Rfl: 3 .  gabapentin (NEURONTIN) 100 MG capsule, Take 1 capsule (100 mg total) by mouth 2 (two) times daily., Disp: 60 capsule, Rfl: 5 .  glipiZIDE (GLUCOTROL) 5 MG tablet, TAKE 1 TABLET (5 MG TOTAL) BY MOUTH 2 (TWO) TIMES DAILY BEFORE A MEAL., Disp: 180 tablet, Rfl: 3 .  HYDROcodone-acetaminophen (NORCO) 10-325 MG tablet, Take 1 tablet by mouth every 6 (six) hours as needed for up to 30 days for moderate pain., Disp: 120 tablet, Rfl: 0 .  hydroxychloroquine (PLAQUENIL) 200 MG tablet, TAKE 1 TABLET BY MOUTH EVERY DAY, Disp: 90 tablet, Rfl: 1 .  Lancets (ACCU-CHEK SOFT TOUCH) lancets, Test once per day and diagnosis code is E 11.9 and dispense for Aviva  plus, Disp: 100 each, Rfl: 3 .  lisinopril-hydrochlorothiazide (PRINZIDE,ZESTORETIC) 20-25 MG tablet, TAKE 1 TABLET BY MOUTH EVERY DAY, Disp: 90 tablet, Rfl: 3 .  omeprazole (PRILOSEC) 40 MG capsule, Take 1 capsule (40 mg total) by mouth daily., Disp: 90 capsule, Rfl: 3 .  ondansetron (ZOFRAN ODT) 4 MG disintegrating tablet, Take 1 tablet (4 mg total) by mouth every 8 (eight) hours as needed for nausea or vomiting., Disp: 30 tablet, Rfl: 5 .  potassium chloride (K-DUR,KLOR-CON) 10 MEQ tablet, Take 1 tablet (10 mEq total) by mouth daily., Disp: 90 tablet, Rfl: 3  EXAM:  VITALS per patient if applicable:  GENERAL: alert,  oriented, appears well and in no acute distress  HEENT: atraumatic, conjunttiva clear, no obvious abnormalities on inspection of external nose and ears  NECK: normal movements of the head and neck  LUNGS: on inspection no signs of respiratory distress, breathing rate appears normal, no obvious gross SOB, gasping or wheezing  CV: no obvious cyanosis  MS: moves all visible extremities without noticeable abnormality  PSYCH/NEURO: pleasant and cooperative, no obvious depression or anxiety, speech and thought processing grossly intact  ASSESSMENT AND PLAN: Pain management, meds were refilled.  [name], MD  Discussed the following assessment and plan:  No diagnosis found.     I discussed the assessment and treatment plan with the patient. The patient was provided an opportunity to ask questions and all were answered. The patient agreed with the plan and demonstrated an understanding of the instructions.   The patient was advised to call back or seek an in-person evaluation if the symptoms worsen or if the condition fails to improve as anticipated.     Review of Systems     Objective:   Physical Exam        Assessment & Plan:

## 2018-06-17 ENCOUNTER — Other Ambulatory Visit: Payer: Self-pay | Admitting: Family Medicine

## 2018-07-29 ENCOUNTER — Other Ambulatory Visit: Payer: Self-pay | Admitting: Family Medicine

## 2018-08-02 ENCOUNTER — Telehealth: Payer: Self-pay | Admitting: Family Medicine

## 2018-08-02 NOTE — Telephone Encounter (Signed)
Late Entry- Pt called [facility] this weekend asking for refill on Zofran due to nausea associated w/ medication.  Prescription filled for #30, no refills and was instructed to follow up w/ PCP if nausea continues.

## 2018-08-02 NOTE — Telephone Encounter (Signed)
Noted  

## 2018-08-31 DIAGNOSIS — R7611 Nonspecific reaction to tuberculin skin test without active tuberculosis: Secondary | ICD-10-CM | POA: Diagnosis not present

## 2018-09-15 ENCOUNTER — Ambulatory Visit (INDEPENDENT_AMBULATORY_CARE_PROVIDER_SITE_OTHER): Payer: Medicare HMO | Admitting: Family Medicine

## 2018-09-15 ENCOUNTER — Other Ambulatory Visit: Payer: Self-pay

## 2018-09-15 ENCOUNTER — Encounter: Payer: Self-pay | Admitting: Family Medicine

## 2018-09-15 DIAGNOSIS — F1190 Opioid use, unspecified, uncomplicated: Secondary | ICD-10-CM

## 2018-09-15 DIAGNOSIS — M5440 Lumbago with sciatica, unspecified side: Secondary | ICD-10-CM

## 2018-09-15 DIAGNOSIS — G8929 Other chronic pain: Secondary | ICD-10-CM | POA: Diagnosis not present

## 2018-09-15 MED ORDER — HYDROCODONE-ACETAMINOPHEN 10-325 MG PO TABS: 1.0000 | ORAL_TABLET | Freq: Four times a day (QID) | ORAL | 0 refills | Status: DC | PRN

## 2018-09-15 NOTE — Progress Notes (Signed)
Subjective:    Patient ID: Wendy [name], female    DOB: 1946-02-22, 73 y.o.   MRN: [id number]  HPI Virtual Visit via Video Note  I connected with the patient on 09/15/18 at  1:00 PM EDT by a video enabled telemedicine application and verified that I am speaking with the correct person using two identifiers.  Location patient: home Location provider:work or home office Persons participating in the virtual visit: patient, provider  I discussed the limitations of evaluation and management by telemedicine and the availability of in person appointments. The patient expressed understanding and agreed to proceed.   HPI: Here for pain management, she is doing well. She has started to use [place] red gensing OTC and she thinks this helps her pain.  Indication for chronic opioid: low back pain Medication and dose: Norco10-325 # pills per month: 120 Last UDS date: 03-23-18 Opioid Treatment Agreement signed (Y/N): 06-19-17 Opioid Treatment Agreement last reviewed with patient:  09-15-18 NCCSRS reviewed this encounter (include red flags):  09-15-18    ROS: See pertinent positives and negatives per HPI.  Past Medical History:  Diagnosis Date  . DEPENDENT EDEMA 07/21/2007  . DIABETES MELLITUS, TYPE II 11/05/2006  . FOOT PAIN 05/23/2009  . GERD 11/20/2006  . HEPATITIS C 02/08/2007   sees GI @ [place] 11/05/2006  . LOW BACK PAIN SYNDROME 07/21/2007  . 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 Rheumatoid arthritis(714.0) 11/05/2006   sees Dr. Gavin Pound     Past Surgical History:  Procedure Laterality Date  . COLONOSCOPY  06-20-08   at [facility], clear   . ESOPHAGOGASTRODUODENOSCOPY  06-20-08   at [facility], clear   . high cholesterol    . LUMBAR LAMINECTOMY     1995    Family History  Problem Relation Age of Onset  . Arthritis Unknown   . Diabetes Unknown   . Hypertension Unknown   . Cancer Unknown        lung     Current Outpatient Medications:   .  ACCU-CHEK AVIVA PLUS test strip, TEST ONCE DAILY , Disp: 100 each, Rfl: 0 .  ALPRAZolam (XANAX) 1 MG tablet, Take 1 tablet (1 mg total) by mouth at bedtime as needed for anxiety or sleep., Disp: 30 tablet, Rfl: 2 .  atorvastatin (LIPITOR) 10 MG tablet, TAKE 1 TABLET BY MOUTH EVERY DAY, Disp: 90 tablet, Rfl: 3 .  CALCIUM-VITAMIN D PO, Take 1 tablet by mouth daily as needed (supplemental). , Disp: , Rfl:  .  fluticasone (FLONASE) 50 MCG/ACT nasal spray, SPRAY 2 SPRAYS INTO EACH NOSTRIL EVERY DAY, Disp: 48 g, Rfl: 2 .  furosemide (LASIX) 20 MG tablet, TAKE 1 TABLET (20 MG TOTAL) BY MOUTH DAILY AS NEEDED (FLUID RETENTION)., Disp: 90 tablet, Rfl: 3 .  gabapentin (NEURONTIN) 100 MG capsule, TAKE 1 CAPSULE BY MOUTH TWICE A DAY, Disp: 180 capsule, Rfl: 2 .  glipiZIDE (GLUCOTROL) 5 MG tablet, TAKE 1 TABLET (5 MG TOTAL) BY MOUTH 2 (TWO) TIMES DAILY BEFORE A MEAL., Disp: 180 tablet, Rfl: 3 .  HYDROcodone-acetaminophen (NORCO) 10-325 MG tablet, Take 1 tablet by mouth every 6 (six) hours as needed for up to 30 days for moderate pain., Disp: 120 tablet, Rfl: 0 .  hydroxychloroquine (PLAQUENIL) 200 MG tablet, TAKE 1 TABLET BY MOUTH EVERY DAY, Disp: 90 tablet, Rfl: 1 .  Lancets (ACCU-CHEK SOFT TOUCH) lancets, Test once per day and diagnosis code is E 11.9 and dispense for Aviva  plus, Disp: 100 each, Rfl: 3 .  lisinopril-hydrochlorothiazide (PRINZIDE,ZESTORETIC) 20-25 MG tablet, TAKE 1 TABLET BY MOUTH EVERY DAY, Disp: 90 tablet, Rfl: 3 .  omeprazole (PRILOSEC) 40 MG capsule, Take 1 capsule (40 mg total) by mouth daily., Disp: 90 capsule, Rfl: 3 .  ondansetron (ZOFRAN-ODT) 4 MG disintegrating tablet, TAKE 1 TABLET BY MOUTH EVERY 8 HOURS AS NEEDED FOR NAUSEA AND VOMITING, Disp: 30 tablet, Rfl: 5 .  potassium chloride (K-DUR,KLOR-CON) 10 MEQ tablet, Take 1 tablet (10 mEq total) by mouth daily., Disp: 90 tablet, Rfl: 3  EXAM:  VITALS per patient if applicable:  GENERAL: alert, oriented, appears well and in no  acute distress  HEENT: atraumatic, conjunttiva clear, no obvious abnormalities on inspection of external nose and ears  NECK: normal movements of the head and neck  LUNGS: on inspection no signs of respiratory distress, breathing rate appears normal, no obvious gross SOB, gasping or wheezing  CV: no obvious cyanosis  MS: moves all visible extremities without noticeable abnormality  PSYCH/NEURO: pleasant and cooperative, no obvious depression or anxiety, speech and thought processing grossly intact  ASSESSMENT AND PLAN: Pain management, meds were refilled.  [name], MD  Discussed the following assessment and plan:  No diagnosis found.     I discussed the assessment and treatment plan with the patient. The patient was provided an opportunity to ask questions and all were answered. The patient agreed with the plan and demonstrated an understanding of the instructions.   The patient was advised to call back or seek an in-person evaluation if the symptoms worsen or if the condition fails to improve as anticipated.     Review of Systems     Objective:   Physical Exam        Assessment & Plan:

## 2018-09-16 ENCOUNTER — Other Ambulatory Visit: Payer: Self-pay | Admitting: Family Medicine

## 2018-10-08 ENCOUNTER — Other Ambulatory Visit: Payer: Self-pay | Admitting: Family Medicine

## 2018-12-03 ENCOUNTER — Other Ambulatory Visit: Payer: Self-pay | Admitting: Family Medicine

## 2018-12-16 ENCOUNTER — Telehealth (INDEPENDENT_AMBULATORY_CARE_PROVIDER_SITE_OTHER): Payer: Medicare HMO | Admitting: Family Medicine

## 2018-12-16 ENCOUNTER — Encounter: Payer: Self-pay | Admitting: Family Medicine

## 2018-12-16 ENCOUNTER — Other Ambulatory Visit: Payer: Self-pay

## 2018-12-16 DIAGNOSIS — F1190 Opioid use, unspecified, uncomplicated: Secondary | ICD-10-CM

## 2018-12-16 DIAGNOSIS — M5440 Lumbago with sciatica, unspecified side: Secondary | ICD-10-CM

## 2018-12-16 DIAGNOSIS — G8929 Other chronic pain: Secondary | ICD-10-CM

## 2018-12-16 MED ORDER — HYDROCODONE-ACETAMINOPHEN 10-325 MG PO TABS: 1.0000 | ORAL_TABLET | Freq: Four times a day (QID) | ORAL | 0 refills | Status: DC | PRN

## 2018-12-16 NOTE — Progress Notes (Signed)
Virtual Visit via Telephone Note  I connected with the patient on 12/16/18 at 11:00 AM EDT by telephone and verified that I am speaking with the correct person using two identifiers. We attempted to connect virtually but we had technical difficulties with the audio and video.     I discussed the limitations, risks, security and privacy concerns of performing an evaluation and management service by telephone and the availability of in person appointments. I also discussed with the patient that there may be a patient responsible charge related to this service. The patient expressed understanding and agreed to proceed.  Location patient: home Location provider: work or home office Participants present for the call: patient, provider Patient did not have a visit in the prior 7 days to address this/these issue(s).   History of Present Illness: Here for pain management, she is doing well.  Indication for chronic opioid: low back pain Medication and dose: Norco 10-325 # pills per month: 120 Last UDS date: 03-23-18 Opioid Treatment Agreement signed (Y/N): 06-19-17 Opioid Treatment Agreement last reviewed with patient:  12-16-18 [place] reviewed this encounter (include red flags): Yes    Observations/Objective: Patient sounds cheerful and well on the phone. I do not appreciate any SOB. Speech and thought processing are grossly intact. Patient reported vitals:  Assessment and Plan:   Follow Up Instructions:     [id number] 5-10 [id number] 21-30 I did not refer this patient for an OV in the next 24 hours for this/these issue(s).  I discussed the assessment and treatment plan with the patient. The patient was provided an opportunity to ask questions and all were answered. The patient agreed with the plan and demonstrated an understanding of the instructions.   The patient was advised to call back or seek an in-person evaluation if the symptoms worsen or if the condition fails to improve  as anticipated.  I provided 12 minutes of non-face-to-face time during this encounter.   [name], MD

## 2018-12-21 ENCOUNTER — Telehealth: Payer: Self-pay | Admitting: *Deleted

## 2018-12-21 NOTE — Telephone Encounter (Signed)
Patient advise that we do not do Flu vaccines on Fridays. Pt was offered another date and time but declined due to work. Pt advised to stop by the local pharmacy to get flu vaccine. Pt verbalized understanding.

## 2018-12-21 NOTE — Telephone Encounter (Signed)
Copied from [place] [phone]. Topic: Appointment Scheduling - Scheduling Inquiry for Clinic >> Dec 21, 2018  1:04 PM Sheran Luz wrote: Patient would like to know if she can come in on a Friday for her flu shot.

## 2018-12-21 NOTE — Telephone Encounter (Signed)
Copied from [place] [id number]. Topic: General - Inquiry >> Dec 21, 2018  1:07 PM Sheran Luz wrote: Patient is requesting call back from CMA to discuss some testing supplies she has received through [facility] for stool sample.

## 2019-02-04 DIAGNOSIS — Z1231 Encounter for screening mammogram for malignant neoplasm of breast: Secondary | ICD-10-CM | POA: Diagnosis not present

## 2019-02-04 LAB — HM MAMMOGRAPHY

## 2019-02-08 ENCOUNTER — Other Ambulatory Visit: Payer: Self-pay | Admitting: Family Medicine

## 2019-02-10 ENCOUNTER — Telehealth: Payer: Self-pay | Admitting: Family Medicine

## 2019-02-10 NOTE — Telephone Encounter (Signed)
Ok for order?  

## 2019-02-10 NOTE — Telephone Encounter (Signed)
Pt requesting rx for back brace from DME company. Please advise.

## 2019-02-11 ENCOUNTER — Encounter: Payer: Self-pay | Admitting: Family Medicine

## 2019-02-11 NOTE — Telephone Encounter (Signed)
The rx is ready  

## 2019-02-11 NOTE — Telephone Encounter (Signed)
Pt has been notified of update. Pt stated the Rx can go to any pharmacy. Sending to [facility]

## 2019-02-14 ENCOUNTER — Encounter: Payer: Self-pay | Admitting: Family Medicine

## 2019-03-11 ENCOUNTER — Encounter: Payer: Self-pay | Admitting: Family Medicine

## 2019-03-17 ENCOUNTER — Telehealth (INDEPENDENT_AMBULATORY_CARE_PROVIDER_SITE_OTHER): Payer: Medicare HMO | Admitting: Family Medicine

## 2019-03-17 ENCOUNTER — Other Ambulatory Visit: Payer: Self-pay

## 2019-03-17 DIAGNOSIS — M5440 Lumbago with sciatica, unspecified side: Secondary | ICD-10-CM

## 2019-03-17 DIAGNOSIS — G8929 Other chronic pain: Secondary | ICD-10-CM

## 2019-03-17 DIAGNOSIS — F1190 Opioid use, unspecified, uncomplicated: Secondary | ICD-10-CM | POA: Diagnosis not present

## 2019-03-17 MED ORDER — HYDROCODONE-ACETAMINOPHEN 10-325 MG PO TABS: 1.0000 | ORAL_TABLET | Freq: Four times a day (QID) | ORAL | 0 refills | Status: DC | PRN

## 2019-03-17 MED ORDER — LEVOCETIRIZINE DIHYDROCHLORIDE 5 MG PO TABS: 5.0000 mg | ORAL_TABLET | Freq: Every evening | ORAL | 3 refills | Status: DC

## 2019-03-17 MED ORDER — FLUTICASONE PROPIONATE 50 MCG/ACT NA SUSP: NASAL | 3 refills | Status: DC

## 2019-03-17 NOTE — Progress Notes (Signed)
Virtual Visit via Telephone Note  I connected with the patient on 03/17/19 at 10:45 AM EST by telephone and verified that I am speaking with the correct person using two identifiers.   I discussed the limitations, risks, security and privacy concerns of performing an evaluation and management service by telephone and the availability of in person appointments. I also discussed with the patient that there may be a patient responsible charge related to this service. The patient expressed understanding and agreed to proceed.  Location patient: home Location provider: work or home office Participants present for the call: patient, provider Patient did not have a visit in the prior 7 days to address this/these issue(s).   History of Present Illness: Here for pain management, she is doing well.  Indication for chronic opioid: low abck pain Medication and dose: Norco 10-325 # pills per month: 120 Last UDS date: 03-23-18 Opioid Treatment Agreement signed (Y/N): 06-19-17 Opioid Treatment Agreement last reviewed with patient:  03-17-19 NCCSRS reviewed this encounter (include red flags): Yes    Observations/Objective: Patient sounds cheerful and well on the phone. I do not appreciate any SOB. Speech and thought processing are grossly intact. Patient reported vitals:  Assessment and Plan: Pain management, meds were refilled.  [name], MD    Follow Up Instructions:     [phone] 5-10 [id number] 11-20 [id number] 21-30 I did not refer this patient for an OV in the next 24 hours for this/these issue(s).  I discussed the assessment and treatment plan with the patient. The patient was provided an opportunity to ask questions and all were answered. The patient agreed with the plan and demonstrated an understanding of the instructions.   The patient was advised to call back or seek an in-person evaluation if the symptoms worsen or if the condition fails to improve as anticipated.  I provided 13  minutes of non-face-to-face time during this encounter.   [name], MD

## 2019-03-20 ENCOUNTER — Other Ambulatory Visit: Payer: Self-pay | Admitting: Family Medicine

## 2019-03-31 ENCOUNTER — Other Ambulatory Visit: Payer: Self-pay | Admitting: Family Medicine

## 2019-04-19 ENCOUNTER — Other Ambulatory Visit: Payer: Self-pay | Admitting: Family Medicine

## 2019-05-16 ENCOUNTER — Telehealth: Payer: Self-pay | Admitting: Family Medicine

## 2019-05-16 NOTE — Telephone Encounter (Signed)
The patient called wanting to speak with Dr. Barbie Banner nurse. I told her that I can send a message back to the nurse to have her to call the patient back and she said that she nurse will know what the message is about.  Please advise

## 2019-05-16 NOTE — Telephone Encounter (Signed)
Pt called wanting to know if she can have a referral to the [facility] to get a letter to place on a pole to designate her parking spot at her apartment complex. I advised pt that we do not do referrals to the [facility], she stated that she will call the DMV to see what she needs to do and will call us back if anything is needed from [place].

## 2019-06-17 ENCOUNTER — Telehealth (INDEPENDENT_AMBULATORY_CARE_PROVIDER_SITE_OTHER): Payer: Medicare HMO | Admitting: Family Medicine

## 2019-06-17 ENCOUNTER — Encounter: Payer: Self-pay | Admitting: Family Medicine

## 2019-06-17 DIAGNOSIS — F1190 Opioid use, unspecified, uncomplicated: Secondary | ICD-10-CM | POA: Diagnosis not present

## 2019-06-17 DIAGNOSIS — G8929 Other chronic pain: Secondary | ICD-10-CM

## 2019-06-17 DIAGNOSIS — M5440 Lumbago with sciatica, unspecified side: Secondary | ICD-10-CM

## 2019-06-17 MED ORDER — HYDROCODONE-ACETAMINOPHEN 10-325 MG PO TABS: 1.0000 | ORAL_TABLET | Freq: Four times a day (QID) | ORAL | 0 refills | Status: DC | PRN

## 2019-06-17 MED ORDER — HYDROCODONE-ACETAMINOPHEN 10-325 MG PO TABS: 1.0000 | ORAL_TABLET | Freq: Four times a day (QID) | ORAL | 0 refills | Status: AC | PRN

## 2019-06-17 MED ORDER — METHOCARBAMOL 500 MG PO TABS: 500.0000 mg | ORAL_TABLET | Freq: Every day | ORAL | 3 refills | Status: DC

## 2019-06-17 NOTE — Progress Notes (Signed)
   Subjective:    Patient ID: Wendy [name], female    DOB: January 26, 1946, 74 y.o.   MRN: [id number]  HPI Virtual Visit via Telephone Note  I connected with the patient on 06/17/19 at 11:00 AM EDT by telephone and verified that I am speaking with the correct person using two identifiers.   I discussed the limitations, risks, security and privacy concerns of performing an evaluation and management service by telephone and the availability of in person appointments. I also discussed with the patient that there may be a patient responsible charge related to this service. The patient expressed understanding and agreed to proceed.  Location patient: home Location provider: work or home office Participants present for the call: patient, provider Patient did not have a visit in the prior 7 days to address this/these issue(s).   History of Present Illness: Here for pain management. She is doing well, although she notes that she is very stiff when she gets up in the mornings.  Indication for chronic opioid: low back pain Medication and dose: Norco 10-325 # pills per month: 120 Last UDS date: 03-23-18 Opioid Treatment Agreement signed (Y/N): 06-19-17 Opioid Treatment Agreement last reviewed with patient:  06-17-19 NCCSRS reviewed this encounter (include red flags): Yes    Observations/Objective: Patient sounds cheerful and well on the phone. I do not appreciate any SOB. Speech and thought processing are grossly intact. Patient reported vitals:  Assessment and Plan: Pain management, meds were refilled.  [name], MD   Follow Up Instructions:     [phone] 5-10 [phone] 11-20 [phone] 21-30 I did not refer this patient for an OV in the next 24 hours for this/these issue(s).  I discussed the assessment and treatment plan with the patient. The patient was provided an opportunity to ask questions and all were answered. The patient agreed with the plan and demonstrated an understanding of the  instructions.   The patient was advised to call back or seek an in-person evaluation if the symptoms worsen or if the condition fails to improve as anticipated.  I provided 13 minutes of non-face-to-face time during this encounter.   [name], MD    Review of Systems     Objective:   Physical Exam        Assessment & Plan:

## 2019-07-15 ENCOUNTER — Telehealth: Payer: Self-pay | Admitting: Family Medicine

## 2019-07-15 NOTE — Chronic Care Management (AMB) (Signed)
  Chronic Care Management   Outreach Note  07/15/2019 Name: Wendy [name] MRN: [id number] DOB: 06/18/1945  Referred by: Laurey Morale, MD Reason for referral : Chronic Care Management   An unsuccessful telephone outreach was attempted today. The patient was referred to the pharmacist for assistance with care management and care coordination.   Follow Up Plan:   [place]

## 2019-07-20 ENCOUNTER — Telehealth: Payer: Self-pay | Admitting: Family Medicine

## 2019-07-20 NOTE — Chronic Care Management (AMB) (Signed)
°  Chronic Care Management   Note  07/20/2019 Name: DIANE CUSICK MRN: [id number] DOB: [birth year]  FEROL PICKNEY is a 74 y.o. year old female who is a primary care patient of Laurey Morale, MD. I reached out to [name] by phone today in response to a referral sent by Ms. Tia Masker Torrisi's PCP, Laurey Morale, MD.   Ms. Holtz was given information about Chronic Care Management services today including:  1. CCM service includes personalized support from designated clinical staff supervised by her physician, including individualized plan of care and coordination with other care providers 2. 24/7 contact phone numbers for assistance for urgent and routine care needs. 3. Service will only be billed when office clinical staff spend 20 minutes or more in a month to coordinate care. 4. Only one practitioner may furnish and bill the service in a calendar month. 5. The patient may stop CCM services at any time (effective at the end of the month) by phone call to the office staff.   Patient agreed to services and verbal consent obtained.   Follow up plan:   [place]

## 2019-08-19 DIAGNOSIS — Z111 Encounter for screening for respiratory tuberculosis: Secondary | ICD-10-CM | POA: Diagnosis not present

## 2019-08-24 ENCOUNTER — Telehealth: Payer: Medicare HMO

## 2019-08-24 ENCOUNTER — Ambulatory Visit: Payer: Medicare HMO

## 2019-08-24 ENCOUNTER — Other Ambulatory Visit: Payer: Self-pay

## 2019-08-24 DIAGNOSIS — R609 Edema, unspecified: Secondary | ICD-10-CM

## 2019-08-24 DIAGNOSIS — E1140 Type 2 diabetes mellitus with diabetic neuropathy, unspecified: Secondary | ICD-10-CM

## 2019-08-24 DIAGNOSIS — M069 Rheumatoid arthritis, unspecified: Secondary | ICD-10-CM

## 2019-08-24 DIAGNOSIS — G8929 Other chronic pain: Secondary | ICD-10-CM

## 2019-08-24 DIAGNOSIS — I10 Essential (primary) hypertension: Secondary | ICD-10-CM

## 2019-08-24 NOTE — Chronic Care Management (AMB) (Signed)
Chronic Care Management Pharmacy  Name: Wendy [name]  MRN: [id number] DOB: 06-Jun-1945  Initial Questions: 1. Have you seen any other providers since your last visit? NA 2. Any changes in your medicines or health? No   Chief Complaint/ HPI  Wendy [name],  74 y.o. , female presents for their Initial CCM visit with the clinical pharmacist via telephone due to COVID-19 Pandemic.  Patient reports diabetes is doing well. She denies recent hypoglycemia symptoms. Last time was over a couple years ago. She reported getting low BGs when she skipped breakfast. Patient reports she needs to call to make eye doc visit since it was canceled and has not rescheduled.    PCP : Wendy Morale, MD  Their chronic conditions include: DM, HTN, Edema, RA, Diabetic neuropathy,N/V, anxiety, allergic rhinitis, tobacco use, low back pain syndrome  Office Visits:06/17/2019- Wendy Penna, MD- patient presented for virtual visit for pain management. Norco 10-325 refilled for low back pain.   Medications: Outpatient Encounter Medications as of 08/24/2019  Medication Sig  . ACCU-CHEK AVIVA PLUS test strip TEST ONCE DAILY   . ALPRAZolam (XANAX) 1 MG tablet Take 1 tablet (1 mg total) by mouth at bedtime as needed for anxiety or sleep.  [name] atorvastatin (LIPITOR) 10 MG tablet TAKE 1 TABLET BY MOUTH EVERY DAY  . b complex vitamins tablet Take 1 tablet by mouth daily.  . Cholecalciferol (VITAMIN D3) 20 MCG (800 UNIT) TABS Take 1 tablet by mouth daily.   . fluticasone (FLONASE) 50 MCG/ACT nasal spray SPRAY 2 SPRAYS INTO EACH NOSTRIL EVERY DAY  . furosemide (LASIX) 20 MG tablet TAKE 1 TABLET (20 MG TOTAL) BY MOUTH DAILY AS NEEDED (FLUID RETENTION).  [name] gabapentin (NEURONTIN) 100 MG capsule TAKE 1 CAPSULE BY MOUTH TWICE A DAY  . glipiZIDE (GLUCOTROL) 5 MG tablet TAKE 1 TABLET (5 MG TOTAL) BY MOUTH 2 (TWO) TIMES DAILY BEFORE A MEAL.  [name] HYDROcodone-acetaminophen (NORCO) 10-325 MG tablet Take 1 tablet by mouth every 6 (six)  hours as needed for moderate pain.  . hydroxychloroquine (PLAQUENIL) 200 MG tablet TAKE 1 TABLET BY MOUTH EVERY DAY  . Lancets (ACCU-CHEK SOFT TOUCH) lancets Test once per day and diagnosis code is E 11.9 and dispense for Aviva plus  . levocetirizine (XYZAL) 5 MG tablet Take 1 tablet (5 mg total) by mouth every evening.  [name] lisinopril-hydrochlorothiazide (ZESTORETIC) 20-25 MG tablet TAKE 1 TABLET BY MOUTH EVERY DAY  . ondansetron (ZOFRAN-ODT) 4 MG disintegrating tablet TAKE 1 TABLET BY MOUTH EVERY 8 HOURS AS NEEDED FOR NAUSEA AND VOMITING  . potassium chloride (KLOR-CON) 10 MEQ tablet TAKE 1 TABLET BY MOUTH EVERY DAY  . CALCIUM-VITAMIN D PO Take 1 tablet by mouth daily as needed (supplemental).  (Patient not taking: Reported on 08/24/2019)  . methocarbamol (ROBAXIN) 500 MG tablet Take 1 tablet (500 mg total) by mouth at bedtime. (Patient not taking: Reported on 08/24/2019)  . omeprazole (PRILOSEC) 40 MG capsule Take 1 capsule (40 mg total) by mouth daily. (Patient not taking: Reported on 08/24/2019)   No facility-administered encounter medications on file as of 08/24/2019.    Current Diagnosis/Assessment:  Goals Addressed            This Visit's Progress   . Pharmacy Care Plan       CARE PLAN ENTRY (see longitudinal plan of care for additional care plan information)  Current Barriers:  . Chronic Disease Management support, education, and care coordination needs related to Hypertension, Diabetes, and Edema, RA, Diabetic  neuropathy, low back pain syndrome    Hypertension BP Readings from Last 3 Encounters:  03/23/18 (!) 146/94  12/21/17 120/82  09/11/17 132/78   . Pharmacist Clinical Goal(s): o Over the next 90 days, patient will work with PharmD and providers to maintain BP goal <130/80 . Current regimen:  o Lisinopril 20-28m, 1 tablet once daily  . Patient self care activities - Over the next 90 days, patient will: o Check BP daily, document, and provide at future  appointments o Ensure daily salt intake < 2300 mg/day  Diabetes Lab Results  Component Value Date/Time   HGBA1C 6.5 03/10/2017 03:18 PM   HGBA1C 6.8 (H) 01/07/2017 04:50 PM   HGBA1C 7.3 (H) 09/19/2016 11:44 AM   . Pharmacist Clinical Goal(s): o Over the next 90 days, patient will work with PharmD and providers to maintain A1c goal <7% . Current regimen:  o Glipizide 573m 1 tablet twice daily before a meal  . Interventions: o We discussed: how to recognize and treat signs of hypoglycemia o Recommended yearly diabetic eye exams.  o Recommended making annual physical with Dr. FrSarajane Jewsnd obtain blood work for A1c. . Patient self care activities - Over the next 90 days, patient will: o Contact provider with any episodes of hypoglycemia o Schedule physical with Dr. FrSarajane Jews  Edema . Pharmacist Clinical Goal(s) o Over the next 90 days, patient will work with PharmD and providers to minimize swelling/ fluid retention. . Current regimen:  . Furosemide 2041m1 tablet once daily as needed for fluid retention . Potassium chloride 59m63m1 tablet once daily  . Interventions: o Recommended scheduling physical with Dr. Fry Sarajane Jews blood work for kidney function and potassium recheck.  . Patient self care activities - Over the next 90 days, patient will: o Schedule physical with Dr. Fry Sarajane Jews physical and recommended blood work.  o Continue current medications as directed and report increased swelling.   Rheumatoid arthritis  . Pharmacist Clinical Goal(s) o Over the next 90 days, patient will work with PharmD and providers to minimize pain and inflammation associated with rheumatoid arthritis.  . Current regimen:  o Hydroxychloroquine 200mg63mtablet once daily  . Patient self care activities o Patient will continue current medications and follow up visits with rheumatologist.  Diabetic neuropathy . Pharmacist Clinical Goal(s) o Over the next 90 days, patient will work with PharmD and providers to  minimize nerve pain . Current regimen:  o Gabapentin 100mg,78mapsule twice daily . Patient self care activities o Patient will continue current medications as instructed.   Low back pain syndrome  . Pharmacist Clinical Goal(s) o Over the next 90 days, patient will work with PharmD and providers to control pain level.  . Current regimen:  o Hydrocodone/ APAP 10/325mg, 88mblet every six hours as needed for moderate pain . Patient self care activities o Patient will continue current medications as instructed and schedule follow up visit with Dr. Fry forSarajane Jewsin management.   Medication management . Pharmacist Clinical Goal(s): o Over the next 90 days, patient will work with PharmD and providers to maintain optimal medication adherence . [facility] . Interventions o Comprehensive medication review performed. o Continue current medication management strategy o Discussed utilizing [facility] for medication synchronization, packaging and delivery . Patient self care activities - Over the next 90 days, patient will: o Take medications as prescribed o Report any questions or concerns to PharmD and/or provider(s)  Initial goal documentation  SDOH Interventions     Most Recent Value  SDOH Interventions  Financial Strain Interventions Intervention Not Indicated  Transportation Interventions Intervention Not Indicated      Diabetes   Recent Relevant Labs: Lab Results  Component Value Date/Time   HGBA1C 6.5 03/10/2017 03:18 PM   HGBA1C 6.8 (H) 01/07/2017 04:50 PM   HGBA1C 7.3 (H) 09/19/2016 11:44 AM   MICROALBUR 0.3 10/19/2013 09:44 AM   MICROALBUR 1.2 03/30/2009 03:00 PM    Checking BG: 2x per Day  Recent BG Readings: 109, 121, 127, 133   Patient has failed these meds in past: metformin (stomach upset), Januvia (low BGs)  Patient is currently controlled on the following medications:   Glipizide 54m, 1 tablet twice daily before a meal   Last diabetic  Eye exam:  Lab Results  Component Value Date/Time   HMDIABEYEEXA No Retinopathy 05/22/2015 10:11 AM    Last diabetic Foot exam: No results found for: HMDIABFOOTEX   We discussed: how to recognize and treat signs of hypoglycemia  Plan Recommended scheduling physical with Dr. FSarajane Jews Overdue for A1c.   Continue current medications   Hypertension  Denies dizziness/ lightheadedness.   Office blood pressures are  BP Readings from Last 3 Encounters:  03/23/18 (!) 146/94  12/21/17 120/82  09/11/17 132/78    Patient has failed these meds in the past: none   Patient checks BP at home daily  Patient home BP readings are ranging: 120-130/ 82-84  Patient is controlled on:   Lisinopril 20-266m 1 tablet once daily   Plan Continue current medications     Primary prevention ASCVD  Patient with diabetes and estimated ASCVD: 44.8%   LDL goal < 100  Lipid Panel     Component Value Date/Time   CHOL 166 09/19/2016 1144   TRIG 60.0 09/19/2016 1144   HDL 61.10 09/19/2016 1144   LDLCALC 93 09/19/2016 1144   LDLDIRECT 143.6 06/05/2010 1021    Hepatic Function Latest Ref Rng & Units 12/24/2016 09/19/2016 04/18/2016  Total Protein 6.5 - 8.1 g/dL 7.9 7.0 6.7  Albumin 3.5 - 5.0 g/dL 3.7 3.7 3.6  AST 15 - 41 U/L _0 ALT 14 - 54 U/L 13(L) 11 11  Alk Phosphatase 38 - 126 U/L 69 64 65  Total Bilirubin 0.3 - 1.2 mg/dL 1.2 0.6 0.4  Bilirubin, Direct 0.0 - 0.3 mg/dL - 0.1 0.1     The ASCVD Risk score (GoArdmore et al., 2013) failed to calculate for the following reasons:   The systolic blood pressure is missing  Estimated ASCVD (with reported BP reading): 44.8%   Patient has failed these meds in past: none Patient is currently controlled on the following medications:  . Atorvastatin 1078m1 tablet once daily   Plan Recommended scheduling physical with Dr. FrySarajane Jewsverdue for lipid panel.   Continue current medications  Dependent edema     Reports not taking daily, about  3  to 4x per week   BMP Latest Ref Rng & Units 01/07/2017 12/24/2016 09/19/2016  Potassium 3.5 - 5.1 mEq/L 4.3 3.1(L) 3.9   Patient is currently controlled on the following medications:  . Furosemide 83m76m tablet once daily as needed for fluid retention . Potassium chloride 10mE6m tablet once daily   Plan Recommended scheduling physical with Dr. Fry- Sarajane Jewsds BMET for monitoring potassium.   Continue current medications  RA   Patient reports taking previously BID, but recent visit with rheumatology, it was decreased to once  daily due to being controlled.    Patient is currently controlled on the following medications:  . Hydroxychloroquine 241m, 1 tablet once daily   Plan Managed by rheumatology.  Continue current medications  Diabetic neuropathy    Patient is currently controlled on the following medications:   Gabapentin 1057m 1 capsule twice daily  Plan Continue current medications  Low back pain syndrome   Reports having 4-5 back surgeries   Denies daytime drowsiness.   Patient is currently controlled on the following medications:  . Hydrocodone/ APAP 10/32546m1 tablet every six hours as needed for moderate pain (takes 3-4x per day)   Plan Continue current medications  Nausea/ vomiting   Patient is currently controlled on the following medications:  . Ondansetron ODT 4mg61m tablet every eight hours as needed for nausea or vomiting  Plan Continue current medications  Anxiety   Patient is currently controlled on the following medications:  . Alprazolam (Xanax) 1mg,91mtablet at bedtime as needed for anxiety or sleep   Plan Continue current medications  Bone health   Last DEXA Scan: 03/24/2011  T-Score forearm radius: -0.6  10-year probability of major osteoporotic fracture: 3.8%  10-year probability of hip fracture: 0.3%  [facility] encounter [facility]  No results found for: VD25OH   Patient is not a  candidate for pharmacologic treatment  Patient is currently controlled on the following medications:   Vitamin D3 800 units, 1 tablet once daily    We discussed:  Recommend [phone] units of vitamin D daily. Recommend 1200 mg of calcium daily from dietary and supplemental sources.  Plan Continue current medications  Address at follow up with patient for last time she obtain bone density scan.   Allergic rhinitis    Patient is currently controlled on the following medications:  . levocetirizine 5mg, 74mablet every evening  . Fluticasone 50mcg/35mnasal spray, 2 sprays into each nostril every day   Plan Continue current medications   Tobacco Use   Tobacco Status:  Social History   Tobacco Use  Smoking Status Current Some Day Smoker  . Packs/day: 0.03  . Years: 45.00  . Pack years: 1.35  . Types: Cigarettes  Smokeless Tobacco Never Used  Tobacco Comment   still smokes     Patient is currently uncontrolled on the following medications: no medications  Plan Readdress at follow up.   Medication Management  Patient organizes medications: reports medications in separate bags. Has own management system.Reports never forgetting to take it.   [facility]  Adherence:   No gaps in refill history (per medication dispense history from 02/25/2019 to 08/24/2019)   Follow up Follow up visit with PharmD in 3 months to focus on smoking cessation.    AnnetteAnson CroftsD Clinical Pharmacist [phone]

## 2019-08-24 NOTE — Patient Instructions (Addendum)
Visit Information  Goals Addressed            This Visit's Progress   . Pharmacy Care Plan       CARE PLAN ENTRY (see longitudinal plan of care for additional care plan information)  Current Barriers:  . Chronic Disease Management support, education, and care coordination needs related to Hypertension, Diabetes, and Edema, RA, Diabetic neuropathy, low back pain syndrome    Hypertension BP Readings from Last 3 Encounters:  03/23/18 (!) 146/94  12/21/17 120/82  09/11/17 132/78   . Pharmacist Clinical Goal(s): o Over the next 90 days, patient will work with PharmD and providers to maintain BP goal <130/80 . Current regimen:  o Lisinopril 20-'25mg'$ , 1 tablet once daily  . Patient self care activities - Over the next 90 days, patient will: o Check BP daily, document, and provide at future appointments o Ensure daily salt intake < 2300 mg/day  Diabetes Lab Results  Component Value Date/Time   HGBA1C 6.5 03/10/2017 03:18 PM   HGBA1C 6.8 (H) 01/07/2017 04:50 PM   HGBA1C 7.3 (H) 09/19/2016 11:44 AM   . Pharmacist Clinical Goal(s): o Over the next 90 days, patient will work with PharmD and providers to maintain A1c goal <7% . Current regimen:  o Glipizide '5mg'$ , 1 tablet twice daily before a meal  . Interventions: o We discussed: how to recognize and treat signs of hypoglycemia o Recommended yearly diabetic eye exams.  o Recommended making annual physical with Dr. Sarajane Jews and obtain blood work for A1c. . Patient self care activities - Over the next 90 days, patient will: o Contact provider with any episodes of hypoglycemia o Schedule physical with Dr. Sarajane Jews.   Edema . Pharmacist Clinical Goal(s) o Over the next 90 days, patient will work with PharmD and providers to minimize swelling/ fluid retention. . Current regimen:  . Furosemide '20mg'$ , 1 tablet once daily as needed for fluid retention . Potassium chloride 89mq, 1 tablet once daily  . Interventions: o Recommended scheduling  physical with Dr. FSarajane Jewsand blood work for kidney function and potassium recheck.  . Patient self care activities - Over the next 90 days, patient will: o Schedule physical with Dr. FSarajane Jewsfor physical and recommended blood work.  o Continue current medications as directed and report increased swelling.   Rheumatoid arthritis  . Pharmacist Clinical Goal(s) o Over the next 90 days, patient will work with PharmD and providers to minimize pain and inflammation associated with rheumatoid arthritis.  . Current regimen:  o Hydroxychloroquine '200mg'$ , 1 tablet once daily  . Patient self care activities o Patient will continue current medications and follow up visits with rheumatologist.  Diabetic neuropathy . Pharmacist Clinical Goal(s) o Over the next 90 days, patient will work with PharmD and providers to minimize nerve pain . Current regimen:  o Gabapentin '100mg'$ , 1 capsule twice daily . Patient self care activities o Patient will continue current medications as instructed.   Low back pain syndrome  . Pharmacist Clinical Goal(s) o Over the next 90 days, patient will work with PharmD and providers to control pain level.  . Current regimen:  o Hydrocodone/ APAP 10/'325mg'$ , 1 tablet every six hours as needed for moderate pain . Patient self care activities o Patient will continue current medications as instructed and schedule follow up visit with Dr. FSarajane Jewsfor pain management.   Medication management . Pharmacist Clinical Goal(s): o Over the next 90 days, patient will work with PharmD and providers to maintain optimal medication  adherence . [facility] . Interventions o Comprehensive medication review performed. o Continue current medication management strategy o Discussed utilizing [facility] pharmacy for medication synchronization, packaging and delivery . Patient self care activities - Over the next 90 days, patient will: o Take medications as prescribed o Report any questions or  concerns to PharmD and/or provider(s)  Initial goal documentation        Wendy [name] was given information about Chronic Care Management services today including:  1. CCM service includes personalized support from designated clinical staff supervised by her physician, including individualized plan of care and coordination with other care providers 2. 24/7 contact phone numbers for assistance for urgent and routine care needs. 3. Standard insurance, coinsurance, copays and deductibles apply for chronic care management only during months in which we provide at least 20 minutes of these services. Most insurances cover these services at 100%, however patients may be responsible for any copay, coinsurance and/or deductible if applicable. This service may help you avoid the need for more expensive face-to-face services. 4. Only one practitioner may furnish and bill the service in a calendar month. 5. The patient may stop CCM services at any time (effective at the end of the month) by phone call to the office staff.  Patient agreed to services and verbal consent obtained.   The patient verbalized understanding of instructions provided today and agreed to receive a mailed copy of patient instruction and/or educational materials. Telephone follow up appointment with pharmacy team member scheduled for: 11/21/2019  Anson Crofts, PharmD Clinical Pharmacist [facility] at [facility] [phone]   Preventive Care 1 Years and Older, Female Preventive care refers to lifestyle choices and visits with your health care provider that can promote health and wellness. This includes:  A yearly physical exam. This is also called an annual well check.  Regular dental and eye exams.  Immunizations.  Screening for certain conditions.  Healthy lifestyle choices, such as diet and exercise. What can I expect for my preventive care visit? Physical exam Your health care provider will  check:  Height and weight. These may be used to calculate body mass index (BMI), which is a measurement that tells if you are at a healthy weight.  Heart rate and blood pressure.  Your skin for abnormal spots. Counseling Your health care provider may ask you questions about:  Alcohol, tobacco, and drug use.  Emotional well-being.  Home and relationship well-being.  Sexual activity.  Eating habits.  History of falls.  Memory and ability to understand (cognition).  Work and work Statistician.  Pregnancy and menstrual history. What immunizations do I need?  Influenza (flu) vaccine  This is recommended every year. Tetanus, diphtheria, and pertussis (Tdap) vaccine  You may need a Td booster every 10 years. Varicella (chickenpox) vaccine  You may need this vaccine if you have not already been vaccinated. Zoster (shingles) vaccine  You may need this after age 42. Pneumococcal conjugate (PCV13) vaccine  One dose is recommended after age 9. Pneumococcal polysaccharide (PPSV23) vaccine  One dose is recommended after age 24. Measles, mumps, and rubella (MMR) vaccine  You may need at least one dose of MMR if you were born in 1957 or later. You may also need a second dose. Meningococcal conjugate (MenACWY) vaccine  You may need this if you have certain conditions. Hepatitis A vaccine  You may need this if you have certain conditions or if you travel or work in places where you may be exposed  to hepatitis A. Hepatitis B vaccine  You may need this if you have certain conditions or if you travel or work in places where you may be exposed to hepatitis B. Haemophilus influenzae type b (Hib) vaccine  You may need this if you have certain conditions. You may receive vaccines as individual doses or as more than one vaccine together in one shot (combination vaccines). Talk with your health care provider about the risks and benefits of combination vaccines. What tests do I  need? Blood tests  Lipid and cholesterol levels. These may be checked every 5 years, or more frequently depending on your overall health.  Hepatitis C test.  Hepatitis B test. Screening  Lung cancer screening. You may have this screening every year starting at age 28 if you have a 30-pack-year history of smoking and currently smoke or have quit within the past 15 years.  Colorectal cancer screening. All adults should have this screening starting at age 71 and continuing until age 55. Your health care provider may recommend screening at age 35 if you are at increased risk. You will have tests every 1-10 years, depending on your results and the type of screening test.  Diabetes screening. This is done by checking your blood sugar (glucose) after you have not eaten for a while (fasting). You may have this done every 1-3 years.  Mammogram. This may be done every 1-2 years. Talk with your health care provider about how often you should have regular mammograms.  BRCA-related cancer screening. This may be done if you have a family history of breast, ovarian, tubal, or peritoneal cancers. Other tests  Sexually transmitted disease (STD) testing.  Bone density scan. This is done to screen for osteoporosis. You may have this done starting at age 32. Follow these instructions at home: Eating and drinking  Eat a diet that includes fresh fruits and vegetables, whole grains, lean protein, and low-fat dairy products. Limit your intake of foods with high amounts of sugar, saturated fats, and salt.  Take vitamin and mineral supplements as recommended by your health care provider.  Do not drink alcohol if your health care provider tells you not to drink.  If you drink alcohol: ? Limit how much you have to 0-1 drink a day. ? Be aware of how much alcohol is in your drink. In the [place] one drink equals one 12 oz bottle of beer (355 mL), one 5 oz glass of wine (148 mL), or one 1 oz glass of hard liquor  (44 mL). Lifestyle  Take daily care of your teeth and gums.  Stay active. Exercise for at least 30 minutes on 5 or more days each week.  Do not use any products that contain nicotine or tobacco, such as cigarettes, e-cigarettes, and chewing tobacco. If you need help quitting, ask your health care provider.  If you are sexually active, practice safe sex. Use a condom or other form of protection in order to prevent STIs (sexually transmitted infections).  Talk with your health care provider about taking a low-dose aspirin or statin. What's next?  Go to your health care provider once a year for a well check visit.  Ask your health care provider how often you should have your eyes and teeth checked.  Stay up to date on all vaccines. This information is not intended to replace advice given to you by your health care provider. Make sure you discuss any questions you have with your health care provider. Document Revised: 02/11/2018 Document  Reviewed: 02/11/2018 Elsevier Patient Education  [employer].

## 2019-08-30 ENCOUNTER — Other Ambulatory Visit: Payer: Self-pay | Admitting: Family Medicine

## 2019-09-16 ENCOUNTER — Encounter: Payer: Self-pay | Admitting: Family Medicine

## 2019-09-16 ENCOUNTER — Other Ambulatory Visit: Payer: Self-pay

## 2019-09-16 ENCOUNTER — Ambulatory Visit (INDEPENDENT_AMBULATORY_CARE_PROVIDER_SITE_OTHER): Payer: Medicare HMO | Admitting: Family Medicine

## 2019-09-16 VITALS — BP 140/70 | Temp 98.3°F | Ht 63.0 in | Wt 166.2 lb

## 2019-09-16 DIAGNOSIS — Z0000 Encounter for general adult medical examination without abnormal findings: Secondary | ICD-10-CM

## 2019-09-16 DIAGNOSIS — E119 Type 2 diabetes mellitus without complications: Secondary | ICD-10-CM

## 2019-09-16 MED ORDER — GLIPIZIDE 5 MG PO TABS: 5.0000 mg | ORAL_TABLET | Freq: Two times a day (BID) | ORAL | 3 refills | Status: DC

## 2019-09-16 MED ORDER — ALPRAZOLAM 1 MG PO TABS: 1.0000 mg | ORAL_TABLET | Freq: Every evening | ORAL | 5 refills | Status: DC | PRN

## 2019-09-16 NOTE — Progress Notes (Signed)
   Subjective:    Patient ID: Wendy [name], female    DOB: 04/12/1945, 74 y.o.   MRN: [id number]  HPI Here for a well exam. She is doing well in general. She has not checked her glucoses in awhile. Her BP is stable.    Review of Systems  Constitutional: Negative.   HENT: Negative.   Eyes: Negative.   Respiratory: Negative.   Cardiovascular: Negative.   Gastrointestinal: Negative.   Genitourinary: Negative for decreased urine volume, difficulty urinating, dyspareunia, dysuria, enuresis, flank pain, frequency, hematuria, pelvic pain and urgency.  Musculoskeletal: Negative.   Skin: Negative.   Neurological: Negative.   Psychiatric/Behavioral: Negative.        Objective:   Physical Exam Constitutional:      General: She is not in acute distress.    Appearance: She is well-developed.  HENT:     Head: Normocephalic and atraumatic.     Right Ear: External ear normal.     Left Ear: External ear normal.     Nose: Nose normal.     Mouth/Throat:     Pharynx: No oropharyngeal exudate.  Eyes:     General: No scleral icterus.    Conjunctiva/sclera: Conjunctivae normal.     Pupils: Pupils are equal, round, and reactive to light.  Neck:     Thyroid: No thyromegaly.     Vascular: No JVD.  Cardiovascular:     Rate and Rhythm: Normal rate and regular rhythm.     Heart sounds: Normal heart sounds. No murmur heard.  No friction rub. No gallop.   Pulmonary:     Effort: Pulmonary effort is normal. No respiratory distress.     Breath sounds: Normal breath sounds. No wheezing or rales.  Chest:     Chest wall: No tenderness.  Abdominal:     General: Bowel sounds are normal. There is no distension.     Palpations: Abdomen is soft. There is no mass.     Tenderness: There is no abdominal tenderness. There is no guarding or rebound.  Musculoskeletal:        General: No tenderness. Normal range of motion.     Cervical back: Normal range of motion and neck supple.  Lymphadenopathy:      Cervical: No cervical adenopathy.  Skin:    General: Skin is warm and dry.     Findings: No erythema or rash.  Neurological:     Mental Status: She is alert and oriented to person, place, and time.     Cranial Nerves: No cranial nerve deficit.     Motor: No abnormal muscle tone.     Coordination: Coordination normal.     Deep Tendon Reflexes: Reflexes are normal and symmetric. Reflexes normal.  Psychiatric:        Behavior: Behavior normal.        Thought Content: Thought content normal.        Judgment: Judgment normal.           Assessment & Plan:  Well exam. We discussed diet and exercise. Get fasting labs. Set up another colonoscopy.  [name], MD

## 2019-09-17 LAB — CBC WITH DIFFERENTIAL/PLATELET
Absolute Monocytes: 795 cells/uL (ref 200–950)
Basophils Absolute: 42 cells/uL (ref 0–200)
Basophils Relative: 0.8 %
Eosinophils Absolute: 58 cells/uL (ref 15–500)
Eosinophils Relative: 1.1 %
HCT: 40.8 % (ref 35.0–45.0)
Hemoglobin: 13.2 g/dL (ref 11.7–15.5)
Lymphs Abs: 1728 cells/uL (ref 850–3900)
MCH: 26 pg — ABNORMAL LOW (ref 27.0–33.0)
MCHC: 32.4 g/dL (ref 32.0–36.0)
MCV: 80.3 fL (ref 80.0–100.0)
MPV: 12.8 fL — ABNORMAL HIGH (ref 7.5–12.5)
Monocytes Relative: 15 %
Neutro Abs: 2677 cells/uL (ref 1500–7800)
Neutrophils Relative %: 50.5 %
Platelets: 205 10*3/uL (ref 140–400)
RBC: 5.08 10*6/uL (ref 3.80–5.10)
RDW: 15.3 % — ABNORMAL HIGH (ref 11.0–15.0)
Total Lymphocyte: 32.6 %
WBC: 5.3 10*3/uL (ref 3.8–10.8)

## 2019-09-17 LAB — LIPID PANEL
Cholesterol: 179 mg/dL (ref <200)
HDL: 59 mg/dL (ref >=50)
LDL Cholesterol (Calc): 100 mg/dL (calc) — ABNORMAL HIGH
Non-HDL Cholesterol (Calc): 120 mg/dL (calc) (ref <130)
Total CHOL/HDL Ratio: 3 (calc) (ref <5.0)
Triglycerides: 107 mg/dL (ref <150)

## 2019-09-17 LAB — HEPATIC FUNCTION PANEL
AG Ratio: 1.2 (calc) (ref 1.0–2.5)
ALT: 15 U/L (ref 6–29)
AST: 17 U/L (ref 10–35)
Albumin: 3.7 g/dL (ref 3.6–5.1)
Alkaline phosphatase (APISO): 66 U/L (ref 37–153)
Bilirubin, Direct: 0.2 mg/dL (ref 0.0–0.2)
Globulin: 3 g/dL (calc) (ref 1.9–3.7)
Indirect Bilirubin: 0.6 mg/dL (calc) (ref 0.2–1.2)
Total Bilirubin: 0.8 mg/dL (ref 0.2–1.2)
Total Protein: 6.7 g/dL (ref 6.1–8.1)

## 2019-09-17 LAB — BASIC METABOLIC PANEL
BUN/Creatinine Ratio: 12 (calc) (ref 6–22)
BUN: 13 mg/dL (ref 7–25)
CO2: 31 mmol/L (ref 20–32)
Calcium: 9.5 mg/dL (ref 8.6–10.4)
Chloride: 103 mmol/L (ref 98–110)
Creat: 1.07 mg/dL — ABNORMAL HIGH (ref 0.60–0.93)
Glucose, Bld: 99 mg/dL (ref 65–99)
Potassium: 4 mmol/L (ref 3.5–5.3)
Sodium: 142 mmol/L (ref 135–146)

## 2019-09-17 LAB — HEMOGLOBIN A1C
Hgb A1c MFr Bld: 6.8 % of total Hgb — ABNORMAL HIGH (ref <5.7)
Mean Plasma Glucose: 148 (calc)
eAG (mmol/L): 8.2 (calc)

## 2019-09-17 LAB — TSH: TSH: 0.8 mIU/L (ref 0.40–4.50)

## 2019-09-19 ENCOUNTER — Encounter: Payer: Self-pay | Admitting: Family Medicine

## 2019-09-19 ENCOUNTER — Telehealth: Payer: Medicare HMO | Admitting: Family Medicine

## 2019-09-20 ENCOUNTER — Encounter: Payer: Self-pay | Admitting: Family Medicine

## 2019-09-20 ENCOUNTER — Telehealth (INDEPENDENT_AMBULATORY_CARE_PROVIDER_SITE_OTHER): Payer: Medicare HMO | Admitting: Family Medicine

## 2019-09-20 ENCOUNTER — Other Ambulatory Visit: Payer: Self-pay

## 2019-09-20 DIAGNOSIS — M5440 Lumbago with sciatica, unspecified side: Secondary | ICD-10-CM

## 2019-09-20 DIAGNOSIS — G8929 Other chronic pain: Secondary | ICD-10-CM

## 2019-09-20 DIAGNOSIS — F1190 Opioid use, unspecified, uncomplicated: Secondary | ICD-10-CM | POA: Diagnosis not present

## 2019-09-20 MED ORDER — HYDROCODONE-ACETAMINOPHEN 10-325 MG PO TABS: 1.0000 | ORAL_TABLET | Freq: Four times a day (QID) | ORAL | 0 refills | Status: DC | PRN

## 2019-09-20 NOTE — Progress Notes (Signed)
   Subjective:    Patient ID: Wendy [name], female    DOB: [birth year], 74 y.o.   MRN: [id number]  HPI Virtual Visit via Telephone Note  I connected with the patient on 09/20/19 at  3:45 PM EDT by telephone and verified that I am speaking with the correct person using two identifiers.   I discussed the limitations, risks, security and privacy concerns of performing an evaluation and management service by telephone and the availability of in person appointments. I also discussed with the patient that there may be a patient responsible charge related to this service. The patient expressed understanding and agreed to proceed.  Location patient: home Location provider: work or home office Participants present for the call: patient, provider Patient did not have a visit in the prior 7 days to address this/these issue(s).   History of Present Illness: Here for pain management, she is doing well.  Indication for chronic opioid: low back pain Medication and dose: Norco 10-325 # pills per month: 120 Last UDS date: 03-23-18 Opioid Treatment Agreement signed (Y/N): 06-19-17 Opioid Treatment Agreement last reviewed with patient:  09-20-19 [facility] reviewed this encounter (include red flags): Yes    Observations/Objective: Patient sounds cheerful and well on the phone. I do not appreciate any SOB. Speech and thought processing are grossly intact. Patient reported vitals:  Assessment and Plan: Pain management, meds were refilled.  [name], MD   Follow Up Instructions:     [phone] 5-10 [phone] 11-20 [id number] 21-30 I did not refer this patient for an OV in the next 24 hours for this/these issue(s).  I discussed the assessment and treatment plan with the patient. The patient was provided an opportunity to ask questions and all were answered. The patient agreed with the plan and demonstrated an understanding of the instructions.   The patient was advised to call back or seek an in-person  evaluation if the symptoms worsen or if the condition fails to improve as anticipated.  I provided 12 minutes of non-face-to-face time during this encounter.   [name], MD    Review of Systems     Objective:   Physical Exam        Assessment & Plan:

## 2019-09-23 ENCOUNTER — Other Ambulatory Visit: Payer: Self-pay

## 2019-09-23 ENCOUNTER — Other Ambulatory Visit (INDEPENDENT_AMBULATORY_CARE_PROVIDER_SITE_OTHER): Payer: Medicare HMO

## 2019-09-23 ENCOUNTER — Telehealth: Payer: Self-pay | Admitting: Family Medicine

## 2019-09-23 DIAGNOSIS — F1190 Opioid use, unspecified, uncomplicated: Secondary | ICD-10-CM | POA: Diagnosis not present

## 2019-09-23 NOTE — Telephone Encounter (Signed)
Pt is calling in to see if she can get her A1C results.

## 2019-09-23 NOTE — Telephone Encounter (Signed)
A1c results read to pt. No further action needed!

## 2019-09-25 LAB — DRUG MONITORING, PANEL 8 WITH CONFIRMATION, URINE
6 Acetylmorphine: NEGATIVE ng/mL (ref <10)
Alcohol Metabolites: NEGATIVE ng/mL
Amphetamines: NEGATIVE ng/mL (ref <500)
Benzodiazepines: NEGATIVE ng/mL (ref <100)
Buprenorphine, Urine: NEGATIVE ng/mL (ref <5)
Cocaine Metabolite: NEGATIVE ng/mL (ref <150)
Codeine: NEGATIVE ng/mL (ref <50)
Creatinine: 143.1 mg/dL
Hydrocodone: 254 ng/mL — ABNORMAL HIGH (ref <50)
Hydromorphone: 417 ng/mL — ABNORMAL HIGH (ref <50)
MDMA: NEGATIVE ng/mL (ref <500)
Marijuana Metabolite: 148 ng/mL — ABNORMAL HIGH (ref <5)
Marijuana Metabolite: POSITIVE ng/mL — AB (ref <20)
Morphine: NEGATIVE ng/mL (ref <50)
Norhydrocodone: 676 ng/mL — ABNORMAL HIGH (ref <50)
Opiates: POSITIVE ng/mL — AB (ref <100)
Oxidant: NEGATIVE ug/mL
Oxycodone: NEGATIVE ng/mL (ref <100)
pH: 5.4 (ref 4.5–9.0)

## 2019-09-25 LAB — DM TEMPLATE

## 2019-10-11 ENCOUNTER — Other Ambulatory Visit: Payer: Self-pay | Admitting: Family Medicine

## 2019-10-11 ENCOUNTER — Ambulatory Visit (INDEPENDENT_AMBULATORY_CARE_PROVIDER_SITE_OTHER): Payer: Medicare HMO

## 2019-10-11 ENCOUNTER — Other Ambulatory Visit: Payer: Self-pay

## 2019-10-11 DIAGNOSIS — Z0000 Encounter for general adult medical examination without abnormal findings: Secondary | ICD-10-CM | POA: Diagnosis not present

## 2019-10-11 NOTE — Progress Notes (Signed)
Subjective:   Wendy [name] is a 74 y.o. female who presents for Medicare Annual (Subsequent) preventive examination.  I connected with Baron Sane today by telephone and verified that I am speaking with the correct person using two identifiers. Location patient: home Location provider: work Persons participating in the virtual visit: patient, provider.   I discussed the limitations, risks, security and privacy concerns of performing an evaluation and management service by telephone and the availability of in person appointments. I also discussed with the patient that there may be a patient responsible charge related to this service. The patient expressed understanding and verbally consented to this telephonic visit.    Interactive audio and video telecommunications were attempted between this provider and patient, however failed, due to patient having technical difficulties OR patient did not have access to video capability.  We continued and completed visit with audio only.      Review of Systems    N/A Cardiac Risk Factors include: advanced age (>77men, >26 women);diabetes mellitus;dyslipidemia;hypertension     Objective:    Today's Vitals   10/11/19 1117  PainSc: 4    There is no height or weight on file to calculate BMI.  Advanced Directives 10/11/2019 02/20/2017 04/18/2016  Does Patient Have a Medical Advance Directive? No No No  Would patient like information on creating a medical advance directive? No - Patient declined No - Patient declined -    Current Medications (verified) Outpatient Encounter Medications as of 10/11/2019  Medication Sig  . ACCU-CHEK AVIVA PLUS test strip TEST ONCE DAILY   . ALPRAZolam (XANAX) 1 MG tablet Take 1 tablet (1 mg total) by mouth at bedtime as needed for anxiety or sleep.  [name] atorvastatin (LIPITOR) 10 MG tablet TAKE 1 TABLET BY MOUTH EVERY DAY  . b complex vitamins tablet Take 1 tablet by mouth daily.  [name] CALCIUM-VITAMIN D PO Take  1 tablet by mouth daily as needed (supplemental).   . Cholecalciferol (VITAMIN D3) 20 MCG (800 UNIT) TABS Take 1 tablet by mouth daily.   . fluticasone (FLONASE) 50 MCG/ACT nasal spray SPRAY 2 SPRAYS INTO EACH NOSTRIL EVERY DAY  . furosemide (LASIX) 20 MG tablet TAKE 1 TABLET (20 MG TOTAL) BY MOUTH DAILY AS NEEDED (FLUID RETENTION).  [name] gabapentin (NEURONTIN) 100 MG capsule TAKE 1 CAPSULE BY MOUTH TWICE A DAY  . Ginkgo Biloba (GNP GINGKO BILOBA EXTRACT PO) Take by mouth.  [name] glipiZIDE (GLUCOTROL) 5 MG tablet Take 1 tablet (5 mg total) by mouth 2 (two) times daily before a meal.  . [START ON 11/21/2019] HYDROcodone-acetaminophen (NORCO) 10-325 MG tablet Take 1 tablet by mouth every 6 (six) hours as needed for severe pain.  . hydroxychloroquine (PLAQUENIL) 200 MG tablet TAKE 1 TABLET BY MOUTH EVERY DAY  . Lancets (ACCU-CHEK SOFT TOUCH) lancets Test once per day and diagnosis code is E 11.9 and dispense for Aviva plus  . levocetirizine (XYZAL) 5 MG tablet Take 1 tablet (5 mg total) by mouth every evening.  [name] lisinopril-hydrochlorothiazide (ZESTORETIC) 20-25 MG tablet TAKE 1 TABLET BY MOUTH EVERY DAY  . methocarbamol (ROBAXIN) 500 MG tablet Take 1 tablet (500 mg total) by mouth at bedtime.  . ondansetron (ZOFRAN-ODT) 4 MG disintegrating tablet TAKE 1 TABLET BY MOUTH EVERY 8 HOURS AS NEEDED FOR NAUSEA AND VOMITING  . potassium chloride (KLOR-CON) 10 MEQ tablet TAKE 1 TABLET BY MOUTH EVERY DAY  . omeprazole (PRILOSEC) 40 MG capsule Take 1 capsule (40 mg total) by mouth daily. (Patient not taking:  Reported on 10/11/2019)   No facility-administered encounter medications on file as of 10/11/2019.    Allergies (verified) Aspirin and Tramadol   History: Past Medical History:  Diagnosis Date  . DEPENDENT EDEMA 07/21/2007  . DIABETES MELLITUS, TYPE II 11/05/2006  . FOOT PAIN 05/23/2009  . GERD 11/20/2006  . HEPATITIS C 02/08/2007   sees GI @ [place] 11/05/2006  . LOW BACK PAIN SYNDROME  07/21/2007  . 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 Rheumatoid arthritis(714.0) 11/05/2006   sees Dr. Gavin Pound    Past Surgical History:  Procedure Laterality Date  . COLONOSCOPY  06-20-08   at [facility], clear   . ESOPHAGOGASTRODUODENOSCOPY  06-20-08   at [facility], clear   . high cholesterol    . LUMBAR LAMINECTOMY     1995   Family History  Problem Relation Age of Onset  . Arthritis Other   . Diabetes Other   . Hypertension Other   . Cancer Other        lung   Social History   Socioeconomic History  . Marital status: Widowed    Spouse name: Not on file  . Number of children: Not on file  . Years of education: Not on file  . Highest education level: Not on file  Occupational History  . Not on file  Tobacco Use  . Smoking status: Current Some Day Smoker    Packs/day: 0.03    Years: 45.00    Pack years: 1.35    Types: Cigarettes  . Smokeless tobacco: Never Used  . Tobacco comment: still smokes   Vaping Use  . Vaping Use: Never used  Substance and Sexual Activity  . Alcohol use: Yes    Alcohol/week: 0.0 standard drinks    Comment: occ  . Drug use: Yes    Types: Marijuana  . Sexual activity: Not on file  Other Topics Concern  . Not on file  Social History Narrative  . Not on file   Social Determinants of Health   Financial Resource Strain: Low Risk   . Difficulty of Paying Living Expenses: Not hard at all  Food Insecurity: No Food Insecurity  . Worried About Charity fundraiser in the Last Year: Never true  . Ran Out of Food in the Last Year: Never true  Transportation Needs: No Transportation Needs  . Lack of Transportation (Medical): No  . Lack of Transportation (Non-Medical): No  Physical Activity: Inactive  . Days of Exercise per Week: 0 days  . Minutes of Exercise per Session: 0 min  Stress: No Stress Concern Present  . Feeling of Stress : Only a little  Social Connections: Moderately Integrated  . Frequency of Communication  with Friends and Family: More than three times a week  . Frequency of Social Gatherings with Friends and Family: Once a week  . Attends Religious Services: 1 to 4 times per year  . Active Member of Clubs or Organizations: Yes  . Attends Archivist Meetings: More than 4 times per year  . Marital Status: Widowed    Tobacco Counseling Ready to quit: Not Answered Counseling given: Not Answered Comment: still smokes    Clinical Intake:  Pre-visit preparation completed: Yes  Pain : 0-10 Pain Score: 4  Pain Type: Chronic pain Pain Orientation: Lower Pain Descriptors / Indicators: Throbbing Pain Onset: More than a month ago Pain Frequency: Constant Pain Relieving Factors: Gabapentin, laying down  Pain Relieving Factors: Gabapentin, laying  down  Nutritional Risks: Nausea/ vomitting/ diarrhea, Other (Comment) (Nausea, constipation) Diabetes: Yes CBG done?: No Did pt. bring in CBG monitor from home?: No (Patient checks blood sugars 3x per day)  How often do you need to have someone help you when you read instructions, pamphlets, or other written materials from your doctor or pharmacy?: 1 - Never What is the last grade level you completed in school?: 2 years of college  Diabetic?Yes  Interpreter Needed?: No  Information entered by :: [place] of Daily Living In your present state of health, do you have any difficulty performing the following activities: 10/11/2019  Hearing? N  Vision? N  Difficulty concentrating or making decisions? N  Walking or climbing stairs? Y  Comment Gets pain, and legs get tired  Dressing or bathing? N  Doing errands, shopping? N  Preparing Food and eating ? N  Using the Toilet? N  In the past six months, have you accidently leaked urine? N  Do you have problems with loss of bowel control? N  Managing your Medications? N  Managing your Finances? N  Housekeeping or managing your Housekeeping? N  Some recent data might be  hidden    Patient Care Team: Laurey Morale, MD as PCP - General Earnie Larsson, [facility] as Pharmacist (Pharmacist)  Indicate any recent Medical Services you may have received from other than [facility] providers in the past year (date may be approximate).     Assessment:   This is a routine wellness examination for [place].  Hearing/Vision screen  Hearing Screening   125Hz  250Hz  500Hz  1000Hz  2000Hz  3000Hz  4000Hz  6000Hz  8000Hz   Right ear:           Left ear:           Vision Screening Comments: Patient gets annually eye exams. Missed last year due to COVID  Dietary issues and exercise activities discussed: Current Exercise Habits: The patient has a physically strenuous job, but has no regular exercise apart from work., Exercise limited by: orthopedic condition(s)  Goals    . Patient Stated     I want to lose this belly fat     . Pharmacy Care Plan     CARE PLAN ENTRY (see longitudinal plan of care for additional care plan information)  Current Barriers:  . Chronic Disease Management support, education, and care coordination needs related to Hypertension, Diabetes, and Edema, RA, Diabetic neuropathy, low back pain syndrome    Hypertension BP Readings from Last 3 Encounters:  03/23/18 (!) 146/94  12/21/17 120/82  09/11/17 132/78   . Pharmacist Clinical Goal(s): o Over the next 90 days, patient will work with PharmD and providers to maintain BP goal <130/80 . Current regimen:  o Lisinopril 20-25mg , 1 tablet once daily  . Patient self care activities - Over the next 90 days, patient will: o Check BP daily, document, and provide at future appointments o Ensure daily salt intake < 2300 mg/day  Diabetes Lab Results  Component Value Date/Time   HGBA1C 6.5 03/10/2017 03:18 PM   HGBA1C 6.8 (H) 01/07/2017 04:50 PM   HGBA1C 7.3 (H) 09/19/2016 11:44 AM   . Pharmacist Clinical Goal(s): o Over the next 90 days, patient will work with PharmD and providers to maintain A1c goal  <7% . Current regimen:  o Glipizide 5mg , 1 tablet twice daily before a meal  . Interventions: o We discussed: how to recognize and treat signs of hypoglycemia o Recommended yearly diabetic eye exams.  o Recommended  making annual physical with Dr. Sarajane Jews and obtain blood work for A1c. . Patient self care activities - Over the next 90 days, patient will: o Contact provider with any episodes of hypoglycemia o Schedule physical with Dr. Sarajane Jews.   Edema . Pharmacist Clinical Goal(s) o Over the next 90 days, patient will work with PharmD and providers to minimize swelling/ fluid retention. . Current regimen:  . Furosemide 20mg , 1 tablet once daily as needed for fluid retention . Potassium chloride 80mEq, 1 tablet once daily  . Interventions: o Recommended scheduling physical with Dr. Sarajane Jews and blood work for kidney function and potassium recheck.  . Patient self care activities - Over the next 90 days, patient will: o Schedule physical with Dr. Sarajane Jews for physical and recommended blood work.  o Continue current medications as directed and report increased swelling.   Rheumatoid arthritis  . Pharmacist Clinical Goal(s) o Over the next 90 days, patient will work with PharmD and providers to minimize pain and inflammation associated with rheumatoid arthritis.  . Current regimen:  o Hydroxychloroquine 200mg , 1 tablet once daily  . Patient self care activities o Patient will continue current medications and follow up visits with rheumatologist.  Diabetic neuropathy . Pharmacist Clinical Goal(s) o Over the next 90 days, patient will work with PharmD and providers to minimize nerve pain . Current regimen:  o Gabapentin 100mg , 1 capsule twice daily . Patient self care activities o Patient will continue current medications as instructed.   Low back pain syndrome  . Pharmacist Clinical Goal(s) o Over the next 90 days, patient will work with PharmD and providers to control pain level.  . Current  regimen:  o Hydrocodone/ APAP 10/325mg , 1 tablet every six hours as needed for moderate pain . Patient self care activities o Patient will continue current medications as instructed and schedule follow up visit with Dr. Sarajane Jews for pain management.   Medication management . Pharmacist Clinical Goal(s): o Over the next 90 days, patient will work with PharmD and providers to maintain optimal medication adherence . Current pharmacy: [facility] . Interventions o Comprehensive medication review performed. o Continue current medication management strategy o Discussed utilizing [facility] pharmacy for medication synchronization, packaging and delivery . Patient self care activities - Over the next 90 days, patient will: o Take medications as prescribed o Report any questions or concerns to PharmD and/or provider(s)  Initial goal documentation     . Quit smoking / using tobacco     Smoking;  Educated to avoid secondary smoke Smoking cessation in [place]: [place] at [phone] -day Smoking cessation in [place]: Evening; [phone] Smoking cessation at [facility]: [phone] Smoking cessation at [facility]: [phone] Classes offered a couple of times a month;  Will work with the patient as far as registration and location  Meds may help; chatix (Varenicline); Zyban (Bupropion SR); Nicotine Replacement (gum; lozenges; patches; etc.)   30 pack yr smoking hx: Educated regarding LDCT; To discuss with MD at next fup. Also educated on AAA screening for men 65-75 who have smoked  [place] Quit; line; 1-800-QUIT-NOW [phone])     . Weight (lb) < 148 lb (67.1 kg)     Cut back on breads;  Eat more lean meat; chicken; fish; tuna Will cut back on fruits and more vegetables   Fat free or low fat dairy products Fish high in omega-3 acids ( salmon, tuna, trout) Fruits, such as apples, bananas, oranges, pears, prunes Legumes, such as kidney beans, lentils, checkpeas, black-eyed peas and lima  beans Vegetables; broccoli, cabbage, carrots Whole grains;   Plant fats are better; decrease "white" foods as pasta, rice, bread and desserts, sugar; Avoid red meat (limiting) palm and coconut oils; sugary foods and beverages  Two nutrients that raise blood chol levels are saturated fats and trans fat; in hydrogenated oils and fats, as stick margarine, baked goods (cookes, cakes, pies, crackers; frosting; and coffee creamers;   Some Fats lower cholesterol: Monounsaturated and polyunsaturated  Avocados Corn, sunflower, and soybean oils Nuts and seeds, such as walnuts Olive, canola, peanut, safflower, and sesame oils Peanut butter Salmon and trout Tofu       . Weight (lb) < 200 lb (90.7 kg)      Depression Screen PHQ 2/9 Scores 10/11/2019 12/21/2017 04/18/2016 04/20/2015  PHQ - 2 Score 0 1 0 0  PHQ- 9 Score 0 - - -    Fall Risk Fall Risk  10/11/2019 12/21/2017 04/18/2016 04/20/2015  Falls in the past year? 0 No No No  Number falls in past yr: 0 - - -  Injury with Fall? 0 - - -  Risk for fall due to : Medication side effect;Orthopedic patient - - -  Follow up Falls evaluation completed;Falls prevention discussed - - -    Any stairs in or around the home? Yes  If so, are there any without handrails? No  Home free of loose throw rugs in walkways, pet beds, electrical cords, etc? Yes  Adequate lighting in your home to reduce risk of falls? Yes   ASSISTIVE DEVICES UTILIZED TO PREVENT FALLS:  Life alert? No  Use of a cane, walker or w/c? No  Grab bars in the bathroom? No  Shower chair or bench in shower? No  Elevated toilet seat or a handicapped toilet? No    Cognitive Function: MMSE - Mini Mental State Exam 04/18/2016  Not completed: (No Data)     6CIT Screen 10/11/2019  What Year? 0 points  What month? 0 points  What time? 0 points  Count back from 20 0 points  Months in reverse 0 points  Repeat phrase 2 points  Total Score 2    Immunizations Immunization History   Administered Date(s) Administered  . Fluad Quad(high Dose 65+) 12/25/2018  . Influenza Whole 02/08/2007, 12/01/2008  . Influenza, High Dose Seasonal PF 01/07/2016, 12/17/2016, 12/21/2017  . Influenza,inj,Quad PF,6+ Mos 12/21/2012, 10/26/2013  . Influenza-Unspecified 12/17/2016  . Pneumococcal Conjugate-13 01/07/2016  . Pneumococcal Polysaccharide-23 12/17/2016    TDAP status: Due, Education has been provided regarding the importance of this vaccine. Advised may receive this vaccine at local pharmacy or Health Dept. Aware to provide a copy of the vaccination record if obtained from local pharmacy or Health Dept. Verbalized acceptance and understanding. Flu Vaccine status: Up to date Pneumococcal vaccine status: Up to date Covid-19 vaccine status: Completed vaccines  Qualifies for Shingles Vaccine? Yes   Zostavax completed No   Shingrix Completed?: No.    Education has been provided regarding the importance of this vaccine. Patient has been advised to call insurance company to determine out of pocket expense if they have not yet received this vaccine. Advised may also receive vaccine at local pharmacy or Health Dept. Verbalized acceptance and understanding.  Screening Tests Health Maintenance  Topic Date Due  . COVID-19 Vaccine (1) Never done  . TETANUS/TDAP  Never done  . COLONOSCOPY  03/03/2012  . FOOT EXAM  10/27/2014  . OPHTHALMOLOGY EXAM  05/21/2016  . INFLUENZA VACCINE  10/02/2019  . HEMOGLOBIN A1C  03/18/2020  . MAMMOGRAM  02/03/2021  . DEXA SCAN  Completed  . Hepatitis C Screening  Completed  . PNA vac Low Risk Adult  Completed    Health Maintenance  Health Maintenance Due  Topic Date Due  . COVID-19 Vaccine (1) Never done  . TETANUS/TDAP  Never done  . COLONOSCOPY  03/03/2012  . FOOT EXAM  10/27/2014  . OPHTHALMOLOGY EXAM  05/21/2016  . INFLUENZA VACCINE  10/02/2019    Colorectal cancer screening: Referral to GI placed Patient is scheduled for 11/23/2019. Pt  aware the office will call re: appt. Mammogram status: Completed 02/04/2019. Repeat every year Bone Density status: Completed 01/27/2018. Results reflect: Bone density results: NORMAL. Repeat every 2 years.  Lung Cancer Screening: (Low Dose CT Chest recommended if Age 73-80 years, 30 pack-year currently smoking OR have quit w/in 15years.) does not qualify.   Lung Cancer Screening Referral: N/A  Additional Screening:  Hepatitis C Screening: does qualify; Completed 05/08/2014  Vision Screening: Recommended annual ophthalmology exams for early detection of glaucoma and other disorders of the eye. Is the patient up to date with their annual eye exam?  Yes  Who is the provider or what is the name of the office in which the patient attends annual eye exams? Dr. Deon Pilling  If pt is not established with a provider, would they like to be referred to a provider to establish care? No .   Dental Screening: Recommended annual dental exams for proper oral hygiene  Community Resource Referral / Chronic Care Management: CRR required this visit?  No   CCM required this visit?  No      Plan:     I have personally reviewed and noted the following in the patient's chart:   . Medical and social history . Use of alcohol, tobacco or illicit drugs  . Current medications and supplements . Functional ability and status . Nutritional status . Physical activity . Advanced directives . List of other physicians . Hospitalizations, surgeries, and ER visits in previous 12 months . Vitals . Screenings to include cognitive, depression, and falls . Referrals and appointments  In addition, I have reviewed and discussed with patient certain preventive protocols, quality metrics, and best practice recommendations. A written personalized care plan for preventive services as well as general preventive health recommendations were provided to patient.     Ofilia Neas, LPN   03/29/5168   Nurse Notes: Patient is  due for her diabetic foot exam and eye exam. Patient states that she will be scheduling her eye exam.

## 2019-10-11 NOTE — Patient Instructions (Signed)
Wendy [name] , Thank you for taking time to come for your Medicare Wellness Visit. I appreciate your ongoing commitment to your health goals. Please review the following plan we discussed and let me know if I can assist you in the future.   Screening recommendations/referrals: Colonoscopy: Scheduled for 11/23/2019 @ [facility] Mammogram: Up to date, next due 02/04/2020 Bone Density: Up to date, next due 01/28/2020 Recommended yearly ophthalmology/optometry visit for glaucoma screening and checkup Recommended yearly dental visit for hygiene and checkup  Vaccinations: Influenza vaccine: Up to date, next due fall 2021 Pneumococcal vaccine: Completed series  Tdap vaccine: Currently due, please contact your insurance company to discuss cost or await injury Shingles vaccine: Currently due, please contact your pharmacy to discuss cost and to receive.    Advanced directives: Advance directive discussed with you today. Even though you declined this today please call our office should you change your mind and we can give you the proper paperwork for you to fill out.   Conditions/risks identified: None  Next appointment: 11/21/2019 @ 11:15 am  Telephone with Pharmacist at [facility] 73 Years and Older, Female Preventive care refers to lifestyle choices and visits with your health care provider that can promote health and wellness. What does preventive care include?  A yearly physical exam. This is also called an annual well check.  Dental exams once or twice a year.  Routine eye exams. Ask your health care provider how often you should have your eyes checked.  Personal lifestyle choices, including:  Daily care of your teeth and gums.  Regular physical activity.  Eating a healthy diet.  Avoiding tobacco and drug use.  Limiting alcohol use.  Practicing safe sex.  Taking low-dose aspirin every day.  Taking vitamin and mineral supplements as  recommended by your health care provider. What happens during an annual well check? The services and screenings done by your health care provider during your annual well check will depend on your age, overall health, lifestyle risk factors, and family history of disease. Counseling  Your health care provider may ask you questions about your:  Alcohol use.  Tobacco use.  Drug use.  Emotional well-being.  Home and relationship well-being.  Sexual activity.  Eating habits.  History of falls.  Memory and ability to understand (cognition).  Work and work Statistician.  Reproductive health. Screening  You may have the following tests or measurements:  Height, weight, and BMI.  Blood pressure.  Lipid and cholesterol levels. These may be checked every 5 years, or more frequently if you are over 40 years old.  Skin check.  Lung cancer screening. You may have this screening every year starting at age 73 if you have a 30-pack-year history of smoking and currently smoke or have quit within the past 15 years.  Fecal occult blood test (FOBT) of the stool. You may have this test every year starting at age 73.  Flexible sigmoidoscopy or colonoscopy. You may have a sigmoidoscopy every 5 years or a colonoscopy every 10 years starting at age 73.  Hepatitis C blood test.  Hepatitis B blood test.  Sexually transmitted disease (STD) testing.  Diabetes screening. This is done by checking your blood sugar (glucose) after you have not eaten for a while (fasting). You may have this done every 1-3 years.  Bone density scan. This is done to screen for osteoporosis. You may have this done starting at age 73.  Mammogram. This may be done every  1-2 years. Talk to your health care provider about how often you should have regular mammograms. Talk with your health care provider about your test results, treatment options, and if necessary, the need for more tests. Vaccines  Your health care  provider may recommend certain vaccines, such as:  Influenza vaccine. This is recommended every year.  Tetanus, diphtheria, and acellular pertussis (Tdap, Td) vaccine. You may need a Td booster every 10 years.  Zoster vaccine. You may need this after age 73.  Pneumococcal 13-valent conjugate (PCV13) vaccine. One dose is recommended after age 73.  Pneumococcal polysaccharide (PPSV23) vaccine. One dose is recommended after age 73. Talk to your health care provider about which screenings and vaccines you need and how often you need them. This information is not intended to replace advice given to you by your health care provider. Make sure you discuss any questions you have with your health care provider. Document Released: 03/16/2015 Document Revised: 11/07/2015 Document Reviewed: 12/19/2014 Elsevier Interactive Patient Education  2017 [place] Prevention in the Home Falls can cause injuries. They can happen to people of all ages. There are many things you can do to make your home safe and to help prevent falls. What can I do on the outside of my home?  Regularly fix the edges of walkways and driveways and fix any cracks.  Remove anything that might make you trip as you walk through a door, such as a raised step or threshold.  Trim any bushes or trees on the path to your home.  Use bright outdoor lighting.  Clear any walking paths of anything that might make someone trip, such as rocks or tools.  Regularly check to see if handrails are loose or broken. Make sure that both sides of any steps have handrails.  Any raised decks and porches should have guardrails on the edges.  Have any leaves, snow, or ice cleared regularly.  Use sand or salt on walking paths during winter.  Clean up any spills in your garage right away. This includes oil or grease spills. What can I do in the bathroom?  Use night lights.  Install grab bars by the toilet and in the tub and shower. Do  not use towel bars as grab bars.  Use non-skid mats or decals in the tub or shower.  If you need to sit down in the shower, use a plastic, non-slip stool.  Keep the floor dry. Clean up any water that spills on the floor as soon as it happens.  Remove soap buildup in the tub or shower regularly.  Attach bath mats securely with double-sided non-slip rug tape.  Do not have throw rugs and other things on the floor that can make you trip. What can I do in the bedroom?  Use night lights.  Make sure that you have a light by your bed that is easy to reach.  Do not use any sheets or blankets that are too big for your bed. They should not hang down onto the floor.  Have a firm chair that has side arms. You can use this for support while you get dressed.  Do not have throw rugs and other things on the floor that can make you trip. What can I do in the kitchen?  Clean up any spills right away.  Avoid walking on wet floors.  Keep items that you use a lot in easy-to-reach places.  If you need to reach something above you, use a strong  step stool that has a grab bar.  Keep electrical cords out of the way.  Do not use floor polish or wax that makes floors slippery. If you must use wax, use non-skid floor wax.  Do not have throw rugs and other things on the floor that can make you trip. What can I do with my stairs?  Do not leave any items on the stairs.  Make sure that there are handrails on both sides of the stairs and use them. Fix handrails that are broken or loose. Make sure that handrails are as long as the stairways.  Check any carpeting to make sure that it is firmly attached to the stairs. Fix any carpet that is loose or worn.  Avoid having throw rugs at the top or bottom of the stairs. If you do have throw rugs, attach them to the floor with carpet tape.  Make sure that you have a light switch at the top of the stairs and the bottom of the stairs. If you do not have them,  ask someone to add them for you. What else can I do to help prevent falls?  Wear shoes that:  Do not have high heels.  Have rubber bottoms.  Are comfortable and fit you well.  Are closed at the toe. Do not wear sandals.  If you use a stepladder:  Make sure that it is fully opened. Do not climb a closed stepladder.  Make sure that both sides of the stepladder are locked into place.  Ask someone to hold it for you, if possible.  Clearly mark and make sure that you can see:  Any grab bars or handrails.  First and last steps.  Where the edge of each step is.  Use tools that help you move around (mobility aids) if they are needed. These include:  Canes.  Walkers.  Scooters.  Crutches.  Turn on the lights when you go into a dark area. Replace any light bulbs as soon as they burn out.  Set up your furniture so you have a clear path. Avoid moving your furniture around.  If any of your floors are uneven, fix them.  If there are any pets around you, be aware of where they are.  Review your medicines with your doctor. Some medicines can make you feel dizzy. This can increase your chance of falling. Ask your doctor what other things that you can do to help prevent falls. This information is not intended to replace advice given to you by your health care provider. Make sure you discuss any questions you have with your health care provider. Document Released: 12/14/2008 Document Revised: 07/26/2015 Document Reviewed: 03/24/2014 Elsevier Interactive Patient Education  2017 [employer].

## 2019-10-17 ENCOUNTER — Encounter: Payer: Self-pay | Admitting: Gastroenterology

## 2019-11-09 ENCOUNTER — Other Ambulatory Visit: Payer: Self-pay

## 2019-11-09 ENCOUNTER — Ambulatory Visit (AMBULATORY_SURGERY_CENTER): Payer: Self-pay | Admitting: *Deleted

## 2019-11-09 VITALS — Ht 63.0 in | Wt 166.0 lb

## 2019-11-09 DIAGNOSIS — Z1211 Encounter for screening for malignant neoplasm of colon: Secondary | ICD-10-CM

## 2019-11-09 MED ORDER — SUPREP BOWEL PREP KIT 17.5-3.13-1.6 GM/177ML PO SOLN: 1.0000 | Freq: Once | ORAL | 0 refills | Status: AC

## 2019-11-09 NOTE — Progress Notes (Signed)
No egg or soy allergy known to patient  No issues with past sedation with any surgeries or procedures- hard to wake up post op  no intubation problems in the past  No FH of Malignant Hyperthermia No diet pills per patient No home 02 use per patient  No blood thinners per patient  Pt denies issues with constipation  No A fib or A flutter  EMMI video to pt or via MyChart  COVID 19 guidelines implemented in [facility] today with Pt and RN   cov vax x 2   Due to the COVID-19 pandemic we are asking patients to follow these guidelines. Please only bring one care partner. Please be aware that your care partner may wait in the car in the parking lot or if they feel like they will be too hot to wait in the car, they may wait in the lobby on the 4th floor. All care partners are required to wear a mask the entire time (we do not have any that we can provide them), they need to practice social distancing, and we will do a Covid check for all patient's and care partners when you arrive. Also we will check their temperature and your temperature. If the care partner waits in their car they need to stay in the parking lot the entire time and we will call them on their cell phone when the patient is ready for discharge so they can bring the car to the front of the building. Also all patient's will need to wear a mask into building.

## 2019-11-13 ENCOUNTER — Other Ambulatory Visit: Payer: Self-pay | Admitting: Family Medicine

## 2019-11-21 ENCOUNTER — Ambulatory Visit: Payer: Medicare HMO

## 2019-11-21 DIAGNOSIS — E119 Type 2 diabetes mellitus without complications: Secondary | ICD-10-CM

## 2019-11-21 DIAGNOSIS — I10 Essential (primary) hypertension: Secondary | ICD-10-CM

## 2019-11-21 NOTE — Chronic Care Management (AMB) (Addendum)
Chronic Care Management Pharmacy  Name: Wendy [name]  MRN: [id number] DOB: [birth year]  Initial Questions: 1. Have you seen any other providers since your last visit? Yes - Dr. Sarajane Jews visit x 2 2. Any changes in your medicines or health? No   Chief Complaint/ HPI  Wendy [name],  74 y.o. , female presents for their Follow-Up CCM visit with the clinical pharmacist via telephone due to COVID-19 Pandemic.   PCP : Laurey Morale, MD  Their chronic conditions include: DM, HTN, Edema, RA, Diabetic neuropathy,N/V, anxiety, allergic rhinitis, tobacco use, low back pain syndrome  Office Visits: 09/20/2019- [name], MD- patient presented for virtual visit for pain management. Norco 10-325 refilled for low back pain. Drug panel positive for opiates, marijuana, and negative for benzos.  09/16/19 [name], MD - patient presented for preventative health care visit. A1c increased from 6.5 to 6.8%. LDL increased to 100. Pt reported not checking her blood sugars at home but BP is stable. Recommended setting up another colonoscopy.  06/17/2019- [name], MD- patient presented for virtual visit for pain management. Norco 10-325 refilled for low back pain.   Medications: Outpatient Encounter Medications as of 11/21/2019  Medication Sig  . ACCU-CHEK AVIVA PLUS test strip TEST ONCE DAILY   . ALPRAZolam (XANAX) 1 MG tablet Take 1 tablet (1 mg total) by mouth at bedtime as needed for anxiety or sleep.  [name] atorvastatin (LIPITOR) 10 MG tablet TAKE 1 TABLET BY MOUTH EVERY DAY  . b complex vitamins tablet Take 1 tablet by mouth daily.  [name] CALCIUM-VITAMIN D PO Take 1 tablet by mouth daily as needed (supplemental).   . Cholecalciferol (VITAMIN D3) 20 MCG (800 UNIT) TABS Take 1 tablet by mouth daily.   Mariane Baumgarten Calcium (STOOL SOFTENER PO) Take by mouth.  . fluticasone (FLONASE) 50 MCG/ACT nasal spray SPRAY 2 SPRAYS INTO EACH NOSTRIL EVERY DAY  . furosemide (LASIX) 20 MG tablet TAKE 1 TABLET (20 MG  TOTAL) BY MOUTH DAILY AS NEEDED (FLUID RETENTION).  [name] gabapentin (NEURONTIN) 100 MG capsule TAKE 1 CAPSULE BY MOUTH TWICE A DAY  . Ginkgo Biloba (GNP GINGKO BILOBA EXTRACT PO) Take by mouth.  [name] glipiZIDE (GLUCOTROL) 5 MG tablet Take 1 tablet (5 mg total) by mouth 2 (two) times daily before a meal.  . HYDROcodone-acetaminophen (NORCO) 10-325 MG tablet Take 1 tablet by mouth every 6 (six) hours as needed for severe pain.  . hydroxychloroquine (PLAQUENIL) 200 MG tablet TAKE 1 TABLET BY MOUTH EVERY DAY  . Lancets (ACCU-CHEK SOFT TOUCH) lancets Test once per day and diagnosis code is E 11.9 and dispense for Aviva plus  . levocetirizine (XYZAL) 5 MG tablet Take 1 tablet (5 mg total) by mouth every evening.  [name] lisinopril-hydrochlorothiazide (ZESTORETIC) 20-25 MG tablet TAKE 1 TABLET BY MOUTH EVERY DAY  . methocarbamol (ROBAXIN) 500 MG tablet Take 1 tablet (500 mg total) by mouth at bedtime.  . Misc Natural Products (GINSENG COMPLEX PO) Take by mouth. As needed for energy  . naproxen sodium (ALEVE) 220 MG tablet Take 220 mg by mouth 2 (two) times daily as needed.  [name] omeprazole (PRILOSEC) 40 MG capsule Take 1 capsule (40 mg total) by mouth daily.  . ondansetron (ZOFRAN-ODT) 4 MG disintegrating tablet TAKE 1 TABLET BY MOUTH EVERY 8 HOURS AS NEEDED FOR NAUSEA AND VOMITING  . OVER THE COUNTER MEDICATION Active Liver Daily  . potassium chloride (KLOR-CON) 10 MEQ tablet TAKE 1 TABLET BY MOUTH EVERY DAY   No facility-administered  encounter medications on file as of 11/21/2019.    Current Diagnosis/Assessment:  Goals Addressed            This Visit's Progress   . Pharmacy Care Plan       CARE PLAN ENTRY (see longitudinal plan of care for additional care plan information)  Current Barriers:  . Chronic Disease Management support, education, and care coordination needs related to Hypertension, Diabetes, and Edema, RA, Diabetic neuropathy, low back pain syndrome    Hypertension BP Readings from Last 3  Encounters:  11/23/19 (!) 184/100  09/16/19 140/70  03/23/18 (!) 146/94   . Pharmacist Clinical Goal(s): o Over the next 30 days, patient will work with PharmD and providers to achieve BP goal <130/80 . Current regimen:  o Lisinopril-HCTZ 20-25mg , 1 tablet once daily . Patient self care activities - Over the next 90 days, patient will: o Check blood pressure daily, document, and provide at future appointments o Ensure daily salt intake < 2300 mg/day  Diabetes Lab Results  Component Value Date/Time   HGBA1C 6.8 (H) 09/16/2019 03:11 PM   HGBA1C 6.5 03/10/2017 03:18 PM   HGBA1C 6.8 (H) 01/07/2017 04:50 PM   . Pharmacist Clinical Goal(s): o Over the next 90 days, patient will work with PharmD and providers to maintain A1c goal <7% . Current regimen:  o Glipizide 5mg , 1 tablet twice daily before a meal  . Interventions: o We discussed: how to recognize and treat signs of hypoglycemia o Recommended yearly diabetic eye exams.  . Patient self care activities - Over the next 90 days, patient will: o Contact provider with any episodes of hypoglycemia o Continue current medications.  Edema . Pharmacist Clinical Goal(s) o Over the next 90 days, patient will work with PharmD and providers to minimize swelling/ fluid retention. . Current regimen:  . Furosemide 20mg , 1 tablet once daily as needed for fluid retention . Potassium chloride 77mEq, 1 tablet once daily  . Patient self care activities - Over the next 90 days, patient will: o Continue current medications as directed and report increased swelling.   Rheumatoid arthritis  . Pharmacist Clinical Goal(s) o Over the next 90 days, patient will work with PharmD and providers to minimize pain and inflammation associated with rheumatoid arthritis.  . Current regimen:  o Hydroxychloroquine 200mg , 1 tablet once daily  . Patient self care activities o Patient will continue current medications and follow up visits with  rheumatologist.  Diabetic neuropathy . Pharmacist Clinical Goal(s) o Over the next 90 days, patient will work with PharmD and providers to minimize nerve pain . Current regimen:  o Gabapentin 100mg , 1 capsule twice daily . Patient self care activities o Patient will continue current medications as instructed.   Low back pain syndrome  . Pharmacist Clinical Goal(s) o Over the next 90 days, patient will work with PharmD and providers to control pain level.  . Current regimen:  o Hydrocodone/ APAP 10/325mg , 1 tablet every six hours as needed for moderate pain . Patient self care activities o Patient will continue current medications as instructed and schedule follow up visit with Dr. Sarajane Jews for pain management.   Medication management . Pharmacist Clinical Goal(s): o Over the next 90 days, patient will work with PharmD and providers to achieve optimal medication adherence . Current [facility]: [facility] . Interventions o Comprehensive medication review performed. o Continue current medication management strategy o Discussed utilizing [facility] for medication synchronization, packaging and delivery . Patient self care activities - Over the  next 90 days, patient will: o Take medications as prescribed o Report any questions or concerns to PharmD and/or provider(s)  Please see past updates related to this goal by clicking on the "Past Updates" button in the selected goal         Diabetes  A1C goal < 7.0  Recent Relevant Labs: Lab Results  Component Value Date/Time   HGBA1C 6.8 (H) 09/16/2019 03:11 PM   HGBA1C 6.5 03/10/2017 03:18 PM   HGBA1C 6.8 (H) 01/07/2017 04:50 PM   MICROALBUR 0.3 10/19/2013 09:44 AM   MICROALBUR 1.2 03/30/2009 03:00 PM    Checking BG: 2x per Day - in morning and in evening (before meals)  Recent BG Readings: 80s, 90s, 101 Low: 60   Patient has failed these meds in past: metformin (stomach upset), Januvia (low BGs)   Patient is currently controlled on  the following medications:   Glipizide 63m, 1 tablet twice daily before a meal   Last diabetic Eye exam:  Lab Results  Component Value Date/Time   HMDIABEYEEXA No Retinopathy 05/22/2015 10:11 AM    Last diabetic Foot exam: No results found for: HMDIABFOOTEX   We discussed: how to recognize and treat signs of hypoglycemia   Plan Patient requested to stop glipizide as blood sugars are dropping "too low". Will discuss decreasing dose with Dr. FSarajane Jews Patient as patient is not willing to try other options (discussed metformin, GLP1, etc.)  Dr. FSarajane Jewsagreed to stop glipizide for now. Instructed patient to stop glipizide on 9/29 and scheduled follow up with Dr. FSarajane Jewsin 2 weeks. Continue current medications   Hypertension  Denies dizziness/ lightheadedness.   Office blood pressures are  BP Readings from Last 3 Encounters:  11/23/19 (!) 184/100  09/16/19 140/70  03/23/18 (!) 146/94    Patient has failed these meds in the past: none   Patient checks BP at home 3-5x per week  Patient home BP readings are ranging: 118/70  Patient is controlled on:   Lisinopril-HCTZ 20-224m 1 tablet once daily   Plan Continue current medications  Patient will check blood pressure AFTER taking medication in the morning to ensure that her BP is not too low at home.   Primary prevention ASCVD  Patient with diabetes and estimated ASCVD: 44.8%   LDL goal < 70  Lipid Panel     Component Value Date/Time   CHOL 179 09/16/2019 1511   TRIG 107 09/16/2019 1511   HDL 59 09/16/2019 1511   LDLCALC 100 (H) 09/16/2019 1511   LDLDIRECT 143.6 06/05/2010 1021    Hepatic Function Latest Ref Rng & Units 09/16/2019 12/24/2016 09/19/2016  Total Protein 6.1 - 8.1 g/dL 6.7 7.9 7.0  Albumin 3.5 - 5.0 g/dL - 3.7 3.7  AST 10 - 35 U/L _0 ALT 6 - 29 U/L 15 13(L) 11  Alk Phosphatase 38 - 126 U/L - 69 64  Total Bilirubin 0.2 - 1.2 mg/dL 0.8 1.2 0.6  Bilirubin, Direct 0.0 - 0.2 mg/dL 0.2 - 0.1     The 10-year  ASCVD risk score (GMikey BussingC Jr., et al., 2013) is: 69.5%   Values used to calculate the score:     Age: [age]     Sex: Female     Is Non-Hispanic African American: Yes     Diabetic: Yes     Tobacco smoker: Yes     Systolic Blood Pressure: 18270mHg     Is BP treated: Yes     HDL Cholesterol:  59 mg/dL     Total Cholesterol: 179 mg/dL     Patient has failed these meds in past: none Patient is currently uncontrolled on the following medications:  . Atorvastatin 10mg , 1 tablet once daily - not taking consistently  We discussed: the importance of taking a statin medication for both LDL lowering and reducing risk of ASCVD  Plan Will discuss switching to an alternative statin with lower risk of leg cramps with Dr. Sarajane Jews. Continue current medications  Dependent edema     Reports not taking daily, about  3 to 4x per week   BMP Latest Ref Rng & Units 09/16/2019 01/07/2017 12/24/2016  Potassium 3.5 - 5.3 mmol/L 4.0 4.3 3.1(L)    Patient is currently controlled on the following medications:  . Furosemide 20mg , 1 tablet once daily as needed for fluid retention . Potassium chloride 29mEq, 1 tablet once daily   Plan Continue current medications  RA   Patient is currently controlled on the following medications:  . Hydroxychloroquine 200mg , 1 tablet once daily   Plan Managed by rheumatology.  Continue current medications  Diabetic neuropathy    Patient is currently controlled on the following medications:   Gabapentin 100mg , 1 capsule twice daily  Plan Continue current medications  Low back pain syndrome   Reports having 4-5 back surgeries   Denies daytime drowsiness.   Patient is currently controlled on the following medications:  . Hydrocodone/ APAP 10/325mg , 1 tablet every six hours as needed for moderate pain (takes 3-4x per day)   Plan Continue current medications  Nausea/ vomiting   Patient is currently controlled on the following medications:  . Ondansetron  ODT 4mg , 1 tablet every eight hours as needed for nausea or vomiting  Plan Continue current medications  Anxiety   Patient is currently controlled on the following medications:  . Alprazolam (Xanax) 1mg , 1 tablet at bedtime as needed for anxiety or sleep   Plan Continue current medications. Plan to discuss need for this during next follow up.  Bone health   Last DEXA Scan: 03/24/2011  T-Score forearm radius: -0.6  10-year probability of major osteoporotic fracture: 3.8%  10-year probability of hip fracture: 0.3%  Telephone encounter requesting bone density order to [facility] on 01/26/2018  No results found for: VD25OH   Patient is not a candidate for pharmacologic treatment  Patient is currently controlled on the following medications:   Vitamin D3 800 units, 1 tablet once daily    We discussed:  Recommend [phone] units of vitamin D daily. Recommend 1200 mg of calcium daily from dietary and supplemental sources.  Plan Continue current medications    Allergic rhinitis    Patient is currently controlled on the following medications:  . levocetirizine 5mg , 1 tablet every evening  . Fluticasone 58mcg/act nasal spray, 2 sprays into each nostril every day   Plan Continue current medications   Tobacco Use   Tobacco Status:  Social History   Tobacco Use  Smoking Status Current Some Day Smoker  . Packs/day: 0.03  . Years: 45.00  . Pack years: 1.35  . Types: Cigars  Smokeless Tobacco Never Used  Tobacco Comment   1 black and mild every 2-3 days- no cigs - doesnt smoke the whole thing     Patient is currently uncontrolled on the following medications: no medications  Plan Readdress at follow up.   Medication Management   [facility] Uses pill box? No - reports medications in separate bags. Has  own management system.Reports never forgetting to take it.   Pt endorses 100% compliance (with everything except atorvastatin)  We  discussed: compliance with medications in order to prevent heart events and hospitalizations  Plan  Continue current medication management strategy   Follow up: 3 month phone visit for DM, HLD, and smoking cessation. Follow up in 1 month for DM and HLD assessment with CPA.   [name], PharmD Clinical Pharmacist [facility]

## 2019-11-23 ENCOUNTER — Ambulatory Visit (AMBULATORY_SURGERY_CENTER): Payer: Medicare HMO | Admitting: Gastroenterology

## 2019-11-23 ENCOUNTER — Other Ambulatory Visit: Payer: Self-pay

## 2019-11-23 ENCOUNTER — Encounter: Payer: Self-pay | Admitting: Gastroenterology

## 2019-11-23 VITALS — BP 184/100 | HR 74 | Temp 96.8°F | Resp 21 | Ht 63.0 in | Wt 166.0 lb

## 2019-11-23 DIAGNOSIS — Z1211 Encounter for screening for malignant neoplasm of colon: Secondary | ICD-10-CM | POA: Diagnosis not present

## 2019-11-23 DIAGNOSIS — D123 Benign neoplasm of transverse colon: Secondary | ICD-10-CM

## 2019-11-23 HISTORY — PX: COLONOSCOPY: SHX174

## 2019-11-23 MED ORDER — SODIUM CHLORIDE 0.9 % IV SOLN: 500.0000 mL | Freq: Once | INTRAVENOUS | Status: DC

## 2019-11-23 NOTE — Progress Notes (Signed)
PT taken to PACU. Monitors in place. VSS. Report given to RN. 

## 2019-11-23 NOTE — Op Note (Signed)
[facility] Patient Name: Wendy [name] Procedure Date: 11/23/2019 10:52 AM MRN: [id number] Endoscopist: Milus Banister , MD Age: 74 Referring MD:  Date of Birth: [birth year] Gender: Female Account #: [id number] Procedure:                Colonoscopy Indications:              Screening for colorectal malignant neoplasm Medicines:                Monitored Anesthesia Care Procedure:                Pre-Anesthesia Assessment:                           - Prior to the procedure, a History and Physical                            was performed, and patient medications and                            allergies were reviewed. The patient's tolerance of                            previous anesthesia was also reviewed. The risks                            and benefits of the procedure and the sedation                            options and risks were discussed with the patient.                            All questions were answered, and informed consent                            was obtained. Prior Anticoagulants: The patient has                            taken no previous anticoagulant or antiplatelet                            agents. ASA Grade Assessment: II - A patient with                            mild systemic disease. After reviewing the risks                            and benefits, the patient was deemed in                            satisfactory condition to undergo the procedure.                           After obtaining informed consent, the colonoscope  was passed under direct vision. Throughout the                            procedure, the patient's blood pressure, pulse, and                            oxygen saturations were monitored continuously. The                            Colonoscope was introduced through the anus and                            advanced to the the cecum, identified by                            appendiceal orifice and  ileocecal valve. The                            colonoscopy was performed without difficulty. The                            patient tolerated the procedure well. The quality                            of the bowel preparation was good. The ileocecal                            valve, appendiceal orifice, and rectum were                            photographed. Scope In: 10:57:24 AM Scope Out: 11:06:02 AM Scope Withdrawal Time: 0 hours 6 minutes 32 seconds  Total Procedure Duration: 0 hours 8 minutes 38 seconds  Findings:                 A 4 mm polyp was found in the transverse colon. The                            polyp was sessile. The polyp was removed with a                            cold snare. Resection and retrieval were complete.                           Multiple small and large-mouthed diverticula were                            found in the left colon.                           The exam was otherwise without abnormality on                            direct and retroflexion views. Complications:  No immediate complications. Estimated blood loss:                            None. Estimated Blood Loss:     Estimated blood loss: none. Impression:               - One 4 mm polyp in the transverse colon, removed                            with a cold snare. Resected and retrieved.                           - Diverticulosis in the left colon.                           - The examination was otherwise normal on direct                            and retroflexion views. Recommendation:           - Patient has a contact number available for                            emergencies. The signs and symptoms of potential                            delayed complications were discussed with the                            patient. Return to normal activities tomorrow.                            Written discharge instructions were provided to the                            patient.                            - Resume previous diet.                           - Continue present medications.                           - Await pathology results. Milus Banister, MD 11/23/2019 11:08:27 AM This report has been signed electronically.

## 2019-11-23 NOTE — Patient Instructions (Signed)
Handouts provided on polyps and diverticulosis.  ? ?YOU HAD AN ENDOSCOPIC PROCEDURE TODAY AT THE [facility]:   Refer to the procedure report that was given to you for any specific questions about what was found during the examination.  If the procedure report does not answer your questions, please call your gastroenterologist to clarify.  If you requested that your care partner not be given the details of your procedure findings, then the procedure report has been included in a sealed envelope for you to review at your convenience later. ? ?YOU SHOULD EXPECT: Some feelings of bloating in the abdomen. Passage of more gas than usual.  Walking can help get rid of the air that was put into your GI tract during the procedure and reduce the bloating. If you had a lower endoscopy (such as a colonoscopy or flexible sigmoidoscopy) you may notice spotting of blood in your stool or on the toilet paper. If you underwent a bowel prep for your procedure, you may not have a normal bowel movement for a few days. ? ?Please Note:  You might notice some irritation and congestion in your nose or some drainage.  This is from the oxygen used during your procedure.  There is no need for concern and it should clear up in a day or so. ? ?SYMPTOMS TO REPORT IMMEDIATELY: ? ?Following lower endoscopy (colonoscopy or flexible sigmoidoscopy): ? Excessive amounts of blood in the stool ? Significant tenderness or worsening of abdominal pains ? Swelling of the abdomen that is new, acute ? Fever of 100?F or higher ? ?For urgent or emergent issues, a gastroenterologist can be reached at any hour by calling [phone]. ?Do not use MyChart messaging for urgent concerns.  ? ? ?DIET:  We do recommend a small meal at first, but then you may proceed to your regular diet.  Drink plenty of fluids but you should avoid alcoholic beverages for 24 hours. ? ?ACTIVITY:  You should plan to take it easy for the rest of today and you should NOT  DRIVE or use heavy machinery until tomorrow (because of the sedation medicines used during the test).   ? ?FOLLOW UP: ?Our staff will call the number listed on your records 48-72 hours following your procedure to check on you and address any questions or concerns that you may have regarding the information given to you following your procedure. If we do not reach you, we will leave a message.  We will attempt to reach you two times.  During this call, we will ask if you have developed any symptoms of COVID 19. If you develop any symptoms (ie: fever, flu-like symptoms, shortness of breath, cough etc.) before then, please call [phone].  If you test positive for Covid 19 in the 2 weeks post procedure, please call and report this information to us.   ? ?If any biopsies were taken you will be contacted by phone or by letter within the next 1-3 weeks.  Please call us at [phone] if you have not heard about the biopsies in 3 weeks.  ? ? ?SIGNATURES/CONFIDENTIALITY: ?You and/or your care partner have signed paperwork which will be entered into your electronic medical record.  These signatures attest to the fact that that the information above on your After Visit Summary has been reviewed and is understood.  Full responsibility of the confidentiality of this discharge information lies with you and/or your care-partner. ? ?

## 2019-11-23 NOTE — Progress Notes (Signed)
Pt's states no medical or surgical changes since previsit or office visit. 

## 2019-11-23 NOTE — Progress Notes (Signed)
Called to room to assist during endoscopic procedure.  Patient ID and intended procedure confirmed with present staff. Received instructions for my participation in the procedure from the performing physician.  

## 2019-11-24 NOTE — Patient Instructions (Addendum)
Visit Information  Goals Addressed            This Visit's Progress   . Pharmacy Care Plan       CARE PLAN ENTRY (see longitudinal plan of care for additional care plan information)  Current Barriers:  . Chronic Disease Management support, education, and care coordination needs related to Hypertension, Diabetes, and Edema, RA, Diabetic neuropathy, low back pain syndrome    Hypertension BP Readings from Last 3 Encounters:  11/23/19 (!) 184/100  09/16/19 140/70  03/23/18 (!) 146/94   . Pharmacist Clinical Goal(s): o Over the next 30 days, patient will work with PharmD and providers to achieve BP goal <130/80 . Current regimen:  o Lisinopril-HCTZ 20-25mg , 1 tablet once daily . Patient self care activities - Over the next 90 days, patient will: o Check blood pressure daily, document, and provide at future appointments o Ensure daily salt intake < 2300 mg/day  Diabetes Lab Results  Component Value Date/Time   HGBA1C 6.8 (H) 09/16/2019 03:11 PM   HGBA1C 6.5 03/10/2017 03:18 PM   HGBA1C 6.8 (H) 01/07/2017 04:50 PM   . Pharmacist Clinical Goal(s): o Over the next 90 days, patient will work with PharmD and providers to maintain A1c goal <7% . Current regimen:  o Glipizide 5mg , 1 tablet twice daily before a meal  . Interventions: o We discussed: how to recognize and treat signs of hypoglycemia o Recommended yearly diabetic eye exams.  . Patient self care activities - Over the next 90 days, patient will: o Contact provider with any episodes of hypoglycemia o Continue current medications.  Edema . Pharmacist Clinical Goal(s) o Over the next 90 days, patient will work with PharmD and providers to minimize swelling/ fluid retention. . Current regimen:  . Furosemide 20mg , 1 tablet once daily as needed for fluid retention . Potassium chloride 13mEq, 1 tablet once daily  . Patient self care activities - Over the next 90 days, patient will: o Continue current medications as  directed and report increased swelling.   Rheumatoid arthritis  . Pharmacist Clinical Goal(s) o Over the next 90 days, patient will work with PharmD and providers to minimize pain and inflammation associated with rheumatoid arthritis.  . Current regimen:  o Hydroxychloroquine 200mg , 1 tablet once daily  . Patient self care activities o Patient will continue current medications and follow up visits with rheumatologist.  Diabetic neuropathy . Pharmacist Clinical Goal(s) o Over the next 90 days, patient will work with PharmD and providers to minimize nerve pain . Current regimen:  o Gabapentin 100mg , 1 capsule twice daily . Patient self care activities o Patient will continue current medications as instructed.   Low back pain syndrome  . Pharmacist Clinical Goal(s) o Over the next 90 days, patient will work with PharmD and providers to control pain level.  . Current regimen:  o Hydrocodone/ APAP 10/325mg , 1 tablet every six hours as needed for moderate pain . Patient self care activities o Patient will continue current medications as instructed and schedule follow up visit with Dr. Sarajane Jews for pain management.   Medication management . Pharmacist Clinical Goal(s): o Over the next 90 days, patient will work with PharmD and providers to achieve optimal medication adherence . Current [facility]: [facility] . Interventions o Comprehensive medication review performed. o Continue current medication management strategy o Discussed utilizing [facility] for medication synchronization, packaging and delivery . Patient self care activities - Over the next 90 days, patient will: o Take medications as prescribed o  Report any questions or concerns to PharmD and/or provider(s)  Please see past updates related to this goal by clicking on the "Past Updates" button in the selected goal         The patient verbalized understanding of instructions provided today and declined a print copy of patient  instruction materials.   The pharmacy team will reach out to the patient again over the next 30 days.    [name], PharmD Clinical Pharmacist [facility]    High Cholesterol  High cholesterol is a condition in which the blood has high levels of a white, waxy, fat-like substance (cholesterol). The human body needs small amounts of cholesterol. The liver makes all the cholesterol that the body needs. Extra (excess) cholesterol comes from the food that we eat. Cholesterol is carried from the liver by the blood through the blood vessels. If you have high cholesterol, deposits (plaques) may build up on the walls of your blood vessels (arteries). Plaques make the arteries narrower and stiffer. Cholesterol plaques increase your risk for heart attack and stroke. Work with your health care provider to keep your cholesterol levels in a healthy range. What increases the risk? This condition is more likely to develop in people who:  Eat foods that are high in animal fat (saturated fat) or cholesterol.  Are overweight.  Are not getting enough exercise.  Have a family history of high cholesterol. What are the signs or symptoms? There are no symptoms of this condition. How is this diagnosed? This condition may be diagnosed from the results of a blood test.  If you are older than age 44, your health care provider may check your cholesterol every 4-6 years.  You may be checked more often if you already have high cholesterol or other risk factors for heart disease. The blood test for cholesterol measures:  "Bad" cholesterol (LDL cholesterol). This is the main type of cholesterol that causes heart disease. The desired level for LDL is less than 100.  "Good" cholesterol (HDL cholesterol). This type helps to protect against heart disease by cleaning the arteries and carrying the LDL away. The desired level for HDL is 60 or higher.  Triglycerides. These are  fats that the body can store or burn for energy. The desired number for triglycerides is lower than 150.  Total cholesterol. This is a measure of the total amount of cholesterol in your blood, including LDL cholesterol, HDL cholesterol, and triglycerides. A healthy number is less than 200. How is this treated? This condition is treated with diet changes, lifestyle changes, and medicines. Diet changes  This may include eating more whole grains, fruits, vegetables, nuts, and fish.  This may also include cutting back on red meat and foods that have a lot of added sugar. Lifestyle changes  Changes may include getting at least 40 minutes of aerobic exercise 3 times a week. Aerobic exercises include walking, biking, and swimming. Aerobic exercise along with a healthy diet can help you maintain a healthy weight.  Changes may also include quitting smoking. Medicines  Medicines are usually given if diet and lifestyle changes have failed to reduce your cholesterol to healthy levels.  Your health care provider may prescribe a statin medicine. Statin medicines have been shown to reduce cholesterol, which can reduce the risk of heart disease. Follow these instructions at home: Eating and drinking If told by your health care provider:  Eat chicken (without skin), fish, veal, shellfish, ground [place] breast, and round or loin  cuts of red meat.  Do not eat fried foods or fatty meats, such as hot dogs and salami.  Eat plenty of fruits, such as apples.  Eat plenty of vegetables, such as broccoli, potatoes, and carrots.  Eat beans, peas, and lentils.  Eat grains such as barley, rice, couscous, and bulgur wheat.  Eat pasta without cream sauces.  Use skim or nonfat milk, and eat low-fat or nonfat yogurt and cheeses.  Do not eat or drink whole milk, cream, ice cream, egg yolks, or hard cheeses.  Do not eat stick margarine or tub margarines that contain trans fats (also called partially  hydrogenated oils).  Do not eat saturated tropical oils, such as coconut oil and palm oil.  Do not eat cakes, cookies, crackers, or other baked goods that contain trans fats.  General instructions  Exercise as directed by your health care provider. Increase your activity level with activities such as gardening, walking, and taking the stairs.  Take over-the-counter and prescription medicines only as told by your health care provider.  Do not use any products that contain nicotine or tobacco, such as cigarettes and e-cigarettes. If you need help quitting, ask your health care provider.  Keep all follow-up visits as told by your health care provider. This is important. Contact a health care provider if:  You are struggling to maintain a healthy diet or weight.  You need help to start on an exercise program.  You need help to stop smoking. Get help right away if:  You have chest pain.  You have trouble breathing. This information is not intended to replace advice given to you by your health care provider. Make sure you discuss any questions you have with your health care provider. Document Revised: 02/20/2017 Document Reviewed: 08/18/2015 Elsevier Patient Education  [place].

## 2019-11-25 ENCOUNTER — Telehealth: Payer: Self-pay | Admitting: *Deleted

## 2019-11-25 NOTE — Telephone Encounter (Signed)
1. Have you developed a fever since your procedure? no  2.   Have you had an respiratory symptoms (SOB or cough) since your procedure? no  3.   Have you tested positive for COVID 19 since your procedure no  4.   Have you had any family members/close contacts diagnosed with the COVID 19 since your procedure?  no   If yes to any of these questions please route to Joylene John, RN and Joella Prince, RN Follow up Call-  Call back number 11/23/2019  Post procedure Call Back phone  # [phone]  Permission to leave phone message Yes  Some recent data might be hidden     Patient questions:  Do you have a fever, pain , or abdominal swelling? No. Pain Score  0 *  Have you tolerated food without any problems? Yes.    Have you been able to return to your normal activities? Yes.    Do you have any questions about your discharge instructions: Diet   No. Medications  No. Follow up visit  No.  Do you have questions or concerns about your Care? No.  Actions: * If pain score is 4 or above: No action needed, pain <4.

## 2019-11-30 ENCOUNTER — Encounter: Payer: Self-pay | Admitting: Gastroenterology

## 2019-12-02 IMAGING — MR MR LUMBAR SPINE WO/W CM
4 of 7 series · 25 of 48 positions shown · IV contrast (multihance)
Comparison: Previous MRI from 10/12/2013 as well as radiograph from
the same day.

CLINICAL DATA: Initial evaluation for chronic mid lower back pain
with right-sided sciatica. Prior lumbar fusion.

EXAM:
MRI LUMBAR SPINE WITHOUT AND WITH CONTRAST
TECHNIQUE: Multiplanar and multiecho pulse sequences of the lumbar spine were
obtained without and with intravenous contrast.
CONTRAST:  15mL MULTIHANCE GADOBENATE DIMEGLUMINE 529 MG/ML IV SOLN

[Series 4: T1 · sagittal · 4.0mm · 0.55mm/px · 4 of 13 slices shown (1 of 2)]
[im 1/13]
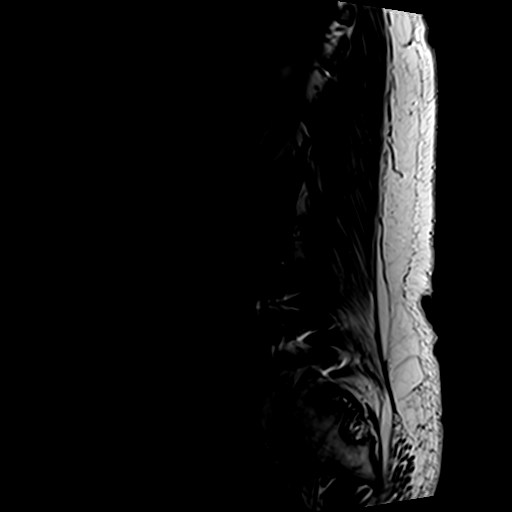
[im 5/13]
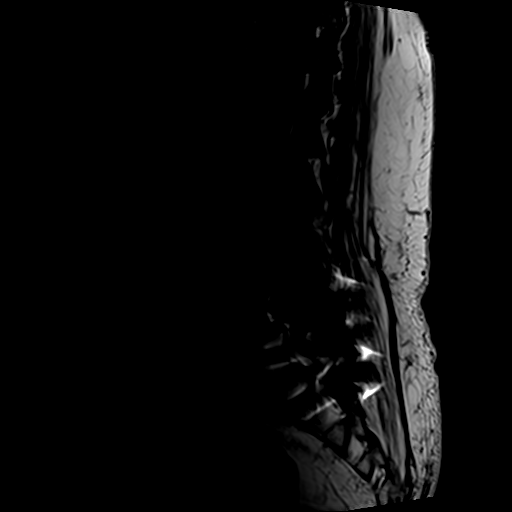
[im 9/13]
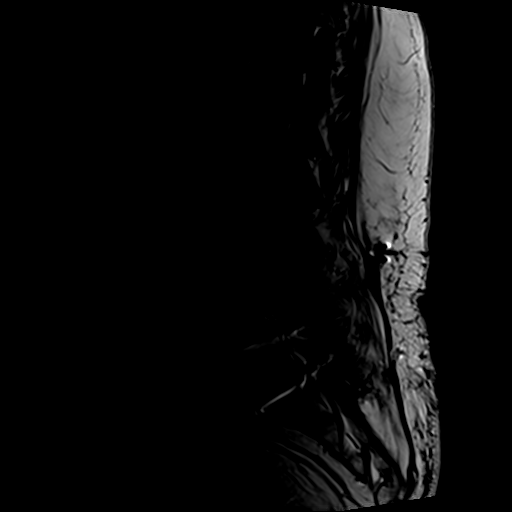
[im 13/13]
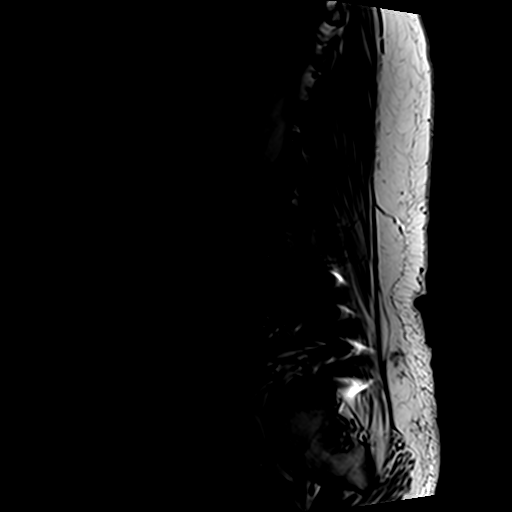

[Series 5: T2 post-contrast · sagittal · 4.0mm · 0.55mm/px · 4 of 13 slices shown]
[im 1/13]
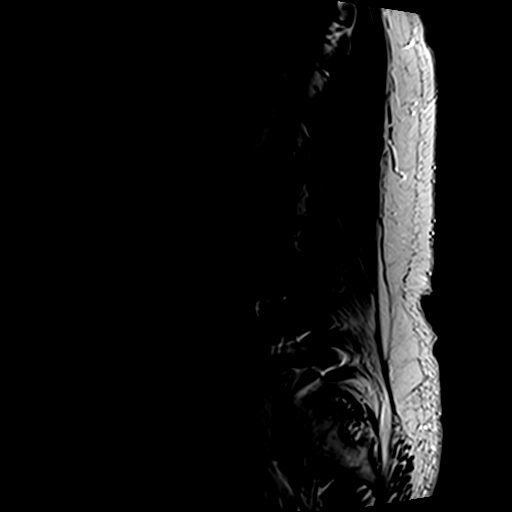
[im 5/13]
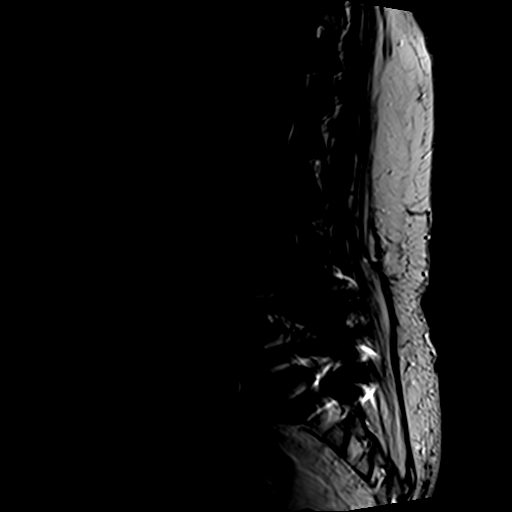
[im 9/13]
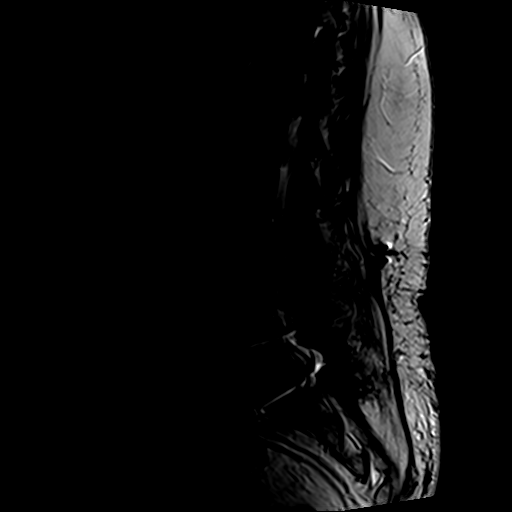
[im 13/13]
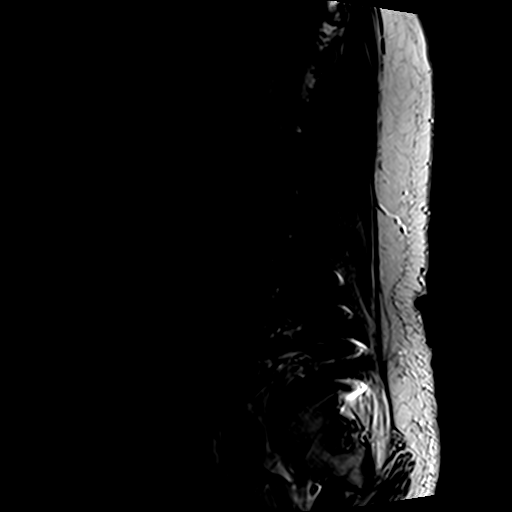

[Series 6: T2 · axial · 4.0mm · 0.70mm/px · z∈[-94,+128]mm · 11 of 40 slices shown]
[im 1/40]
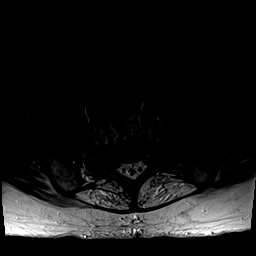
[im 4/40]
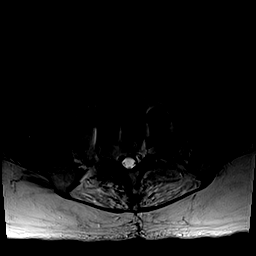
[im 8/40]
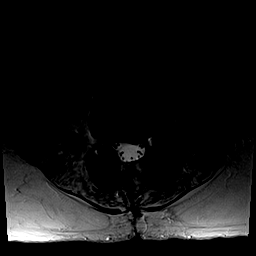
[im 12/40]
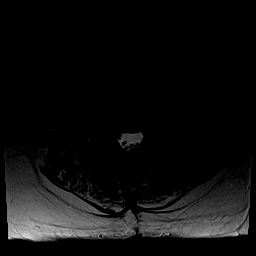
[im 16/40]
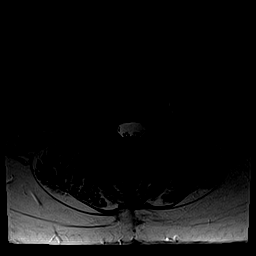
[im 20/40]
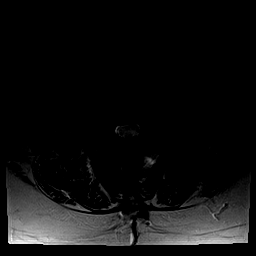
[im 24/40]
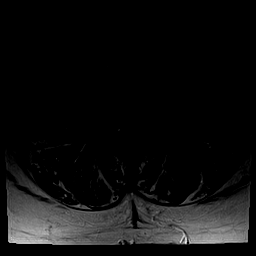
[im 28/40]
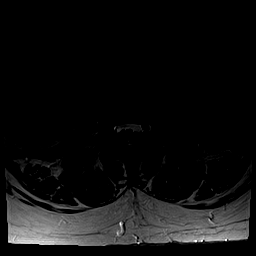
[im 32/40]
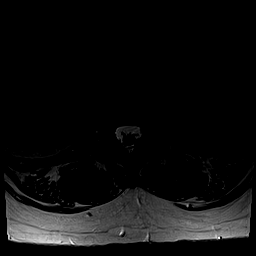
[im 36/40]
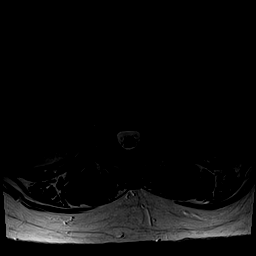
[im 40/40]
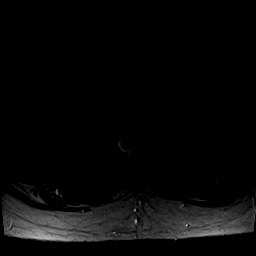

[Series 7: T1 · axial · 4.0mm · 0.35mm/px · z∈[-94,+107]mm · 6 of 40 slices shown (2 of 2)]
[im 1/40]
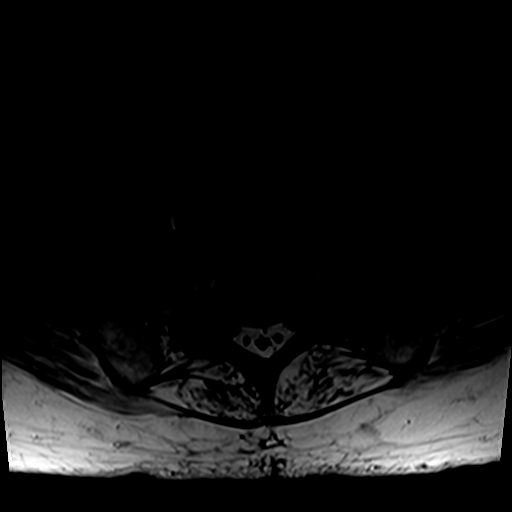
[im 4/40]
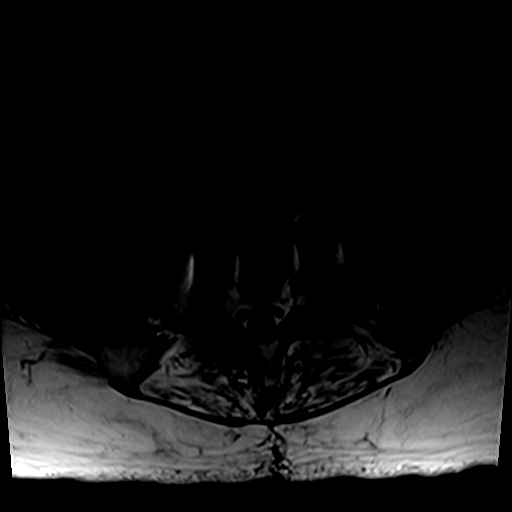
[im 8/40]
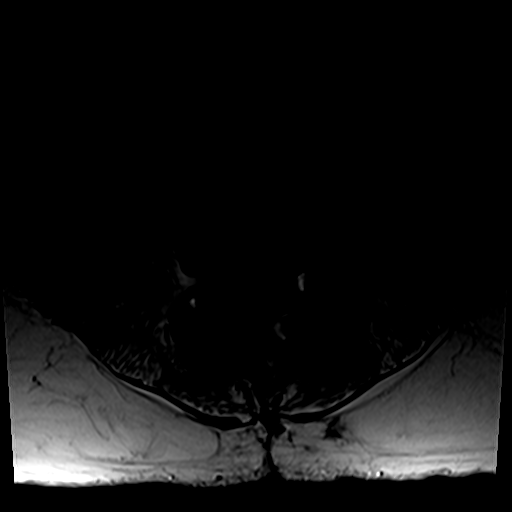
[im 12/40]
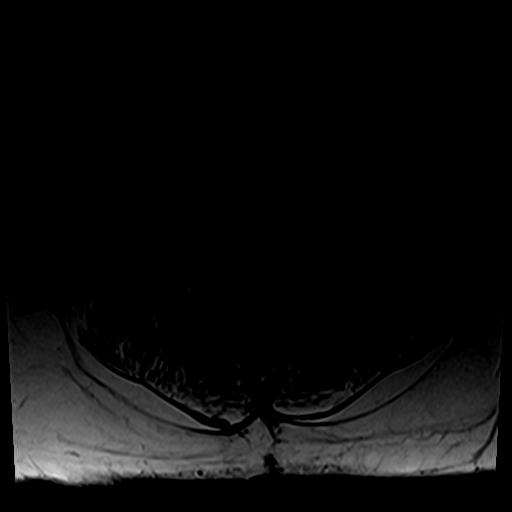
[im 20/40]
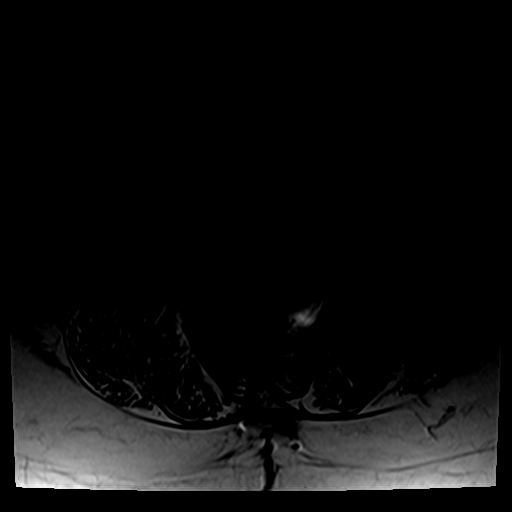
[im 36/40]
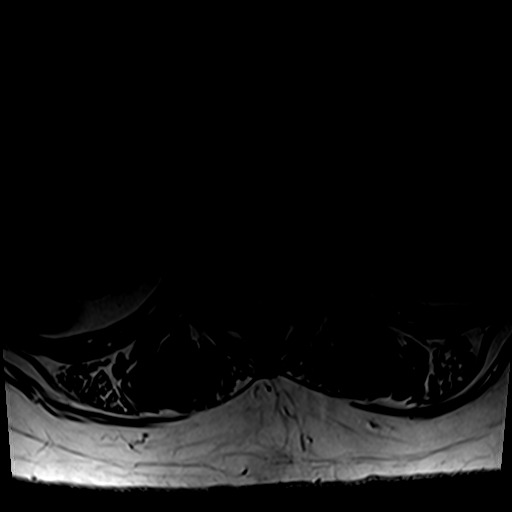

[25 of 48 positions shown; findings below may reference images not displayed]

FINDINGS: Segmentation: Normal segmentation. Lowest well-formed disc labeled
the L5-S1 level.

Alignment: 6 mm anterolisthesis of L4 on L5, stable. Trace
retrolisthesis of T11 on T12 noted. Alignment otherwise within
normal limits with preservation of the normal lumbar lordosis.

Vertebrae: Susceptibility artifact from prior posterior and
interbody fusion at L4 through S1. Vertebral body heights maintained
without evidence for acute or chronic fracture. Underlying bone
marrow signal intensity within normal limits. No discrete or
worrisome osseous lesions. Progressive reactive endplate changes
about the L2-3 interspace.

Conus medullaris and cauda equina: Conus extends to the L2 level.
Conus and cauda equina appear normal.

Paraspinal and other soft tissues: Chronic postoperative changes
within the posterior paraspinous soft tissues. Paraspinous soft
tissues demonstrate no acute finding. Atherosclerotic change present
within the intra-abdominal aorta. No visible aneurysm. Few scattered
renal cysts noted, largest of which measures 2.3 cm on the left.

Disc levels:

T11-12: Mild diffuse disc bulge with bilateral facet hypertrophy. No
significant stenosis.

T12-L1: Unremarkable.

L1-2: Negative interspace.  Mild facet hypertrophy.  No stenosis.

L2-3: Progressive intervertebral disc space narrowing with diffuse
disc bulge, disc desiccation, and reactive endplate changes.
Moderate facet and ligamentum flavum hypertrophy. Trace joint
effusion on the right. Moderate spinal stenosis with bilateral L2
foraminal narrowing, progressed from previous.

L3-4: Mild disc bulge. Mild facet and ligament flavum hypertrophy.
Central canal remains patent. Mild bilateral L3 foraminal stenosis,
perhaps minimally increased from previous.

L4-5: 6 mm anterolisthesis of L4 on L5. Prior interbody and
posterior fusion with posterior decompression. Thecal sac mildly
deformed with epidural fibrosis along the right posterolateral
aspect. No significant residual spinal stenosis. Suspected mild
residual bilateral foraminal narrowing, relatively stable.

L5-S1: Prior posterior and interbody fusion with posterior
decompression. Residual left paracentral/left lateral recess
spurring, similar to previous. Epidural fibrosis surrounds the
posterior and lateral thecal sac. No residual or recurrent stenosis.
IMPRESSION: 1. Progressive degenerative disc disease and facet hypertrophy at
L2-3 with worsened moderate canal and bilateral L2 foraminal
stenosis.
2. Disc bulge with facet hypertrophy at L3-4 with resultant mild
bilateral L3 foraminal stenosis, stable to mildly progressed from
previous.
3. Stable postoperative changes at L4 through S1 without significant
residual or recurrent stenosis.

## 2019-12-16 ENCOUNTER — Encounter: Payer: Self-pay | Admitting: Family Medicine

## 2019-12-16 ENCOUNTER — Other Ambulatory Visit: Payer: Self-pay

## 2019-12-16 ENCOUNTER — Ambulatory Visit (INDEPENDENT_AMBULATORY_CARE_PROVIDER_SITE_OTHER): Payer: Medicare HMO | Admitting: Family Medicine

## 2019-12-16 VITALS — BP 158/76 | HR 87 | Temp 99.2°F | Ht 63.0 in | Wt 163.0 lb

## 2019-12-16 DIAGNOSIS — G8929 Other chronic pain: Secondary | ICD-10-CM | POA: Diagnosis not present

## 2019-12-16 DIAGNOSIS — Z23 Encounter for immunization: Secondary | ICD-10-CM

## 2019-12-16 DIAGNOSIS — F1190 Opioid use, unspecified, uncomplicated: Secondary | ICD-10-CM

## 2019-12-16 DIAGNOSIS — M5440 Lumbago with sciatica, unspecified side: Secondary | ICD-10-CM

## 2019-12-19 ENCOUNTER — Encounter: Payer: Self-pay | Admitting: Family Medicine

## 2019-12-19 NOTE — Progress Notes (Signed)
   Subjective:    Patient ID: Wendy [name], female    DOB: 1945/07/19, 74 y.o.   MRN: [id number]  HPI Here for pain management. She is doing well.  Indication for chronic opioid: low back pain Medication and dose: Norco 10-325 # pills per month: 120 Last UDS date: 09-23-19 Opioid Treatment Agreement signed (Y/N): 06-19-17 Opioid Treatment Agreement last reviewed with patient:  12-16-19 [facility] reviewed this encounter (include red flags): Yes    Review of Systems     Objective:   Physical Exam        Assessment & Plan:  Pain management, meds were refilled.  [name], MD

## 2019-12-20 ENCOUNTER — Other Ambulatory Visit: Payer: Self-pay | Admitting: Family Medicine

## 2019-12-20 MED ORDER — LISINOPRIL-HYDROCHLOROTHIAZIDE 20-25 MG PO TABS
1.0000 | ORAL_TABLET | Freq: Every day | ORAL | 3 refills | Status: DC
Start: 2019-12-20 — End: 2020-07-13

## 2019-12-20 MED ORDER — OMEPRAZOLE 40 MG PO CPDR
40.0000 mg | DELAYED_RELEASE_CAPSULE | Freq: Every day | ORAL | 3 refills | Status: DC
Start: 2019-12-20 — End: 2020-10-23

## 2019-12-20 MED ORDER — POTASSIUM CHLORIDE ER 10 MEQ PO TBCR: 10.0000 meq | EXTENDED_RELEASE_TABLET | Freq: Every day | ORAL | 3 refills | Status: DC

## 2019-12-20 MED ORDER — ATORVASTATIN CALCIUM 10 MG PO TABS
10.0000 mg | ORAL_TABLET | Freq: Every day | ORAL | 3 refills | Status: DC
Start: 2019-12-20 — End: 2020-11-26

## 2019-12-20 NOTE — Telephone Encounter (Signed)
Patient called stating the pharmacy does not have her Hydrocodone.

## 2019-12-20 NOTE — Telephone Encounter (Signed)
Patient called and asked which medications she will need refills on, she says:  Xanax (told [facility] refills at [facility]) Flonase (told she [facility] refills at [facility]) Xyzal (told she [facility] refills at [facility]) Methocarbamol (told she has refills at [facility])  Hydrocodone (will send to PCP for refill) Potassium (will refill) Lisinopril-HCTZ (will refill) Atorvastatin (will refill) Omeprazole (will refill)

## 2019-12-20 NOTE — Telephone Encounter (Signed)
Pt is calling in stating that she is out of her Hydrocodone-acetaminophen (NORCO) 10-325 MG and all other medications that Dr. Sarajane Jews normally sends in pt would not elaborate on which ones they are she stated that Dr. Sarajane Jews would know due to him filling them at different times.  Pharm: [facility] [address]

## 2019-12-21 MED ORDER — HYDROCODONE-ACETAMINOPHEN 10-325 MG PO TABS: 1.0000 | ORAL_TABLET | Freq: Four times a day (QID) | ORAL | 0 refills | Status: DC | PRN

## 2019-12-21 MED ORDER — HYDROCODONE-ACETAMINOPHEN 10-325 MG PO TABS
1.0000 | ORAL_TABLET | Freq: Four times a day (QID) | ORAL | 0 refills | Status: DC | PRN
Start: 2020-02-20 — End: 2020-03-21

## 2019-12-21 MED ORDER — HYDROCODONE-ACETAMINOPHEN 10-325 MG PO TABS
1.0000 | ORAL_TABLET | Freq: Four times a day (QID) | ORAL | 0 refills | Status: DC | PRN
Start: 2020-01-21 — End: 2019-12-21

## 2019-12-21 NOTE — Telephone Encounter (Signed)
Patient called back into the office to check the status of her medication being sent to the pharmacy   Hydrocodone    Please advise

## 2019-12-21 NOTE — Telephone Encounter (Signed)
I sent in refills for the hydrocodone

## 2019-12-21 NOTE — Addendum Note (Signed)
Addended by: [name] A on: 12/21/2019 04:14 PM   Modules accepted: Orders

## 2019-12-21 NOTE — Telephone Encounter (Signed)
Patient called, left VM that Hydrocodone was sent to the pharmacy.

## 2019-12-21 NOTE — Telephone Encounter (Signed)
Pt is calling in to check the status of Rx Hydrocodone and she is aware that we ask for 24-72 hrs for refills and it is on the doctors desk top for review.  Pt would like to have a call when it has been sent to the pharmacy.

## 2020-01-21 ENCOUNTER — Other Ambulatory Visit: Payer: Self-pay | Admitting: Family Medicine

## 2020-02-16 ENCOUNTER — Telehealth: Payer: Self-pay | Admitting: Pharmacist

## 2020-02-16 NOTE — Chronic Care Management (AMB) (Signed)
I left the patient a message about her upcoming appointment on 02-17-2020 @ 11:30 am with the clinical pharmacist. She was asked to please have all medication on hand to review the pharmacist.   Maia Breslow, [place] Assistant [phone]

## 2020-02-17 ENCOUNTER — Telehealth: Payer: Self-pay

## 2020-02-17 ENCOUNTER — Other Ambulatory Visit: Payer: Self-pay | Admitting: Family Medicine

## 2020-02-17 ENCOUNTER — Ambulatory Visit: Payer: Medicare HMO | Admitting: Pharmacist

## 2020-02-17 DIAGNOSIS — E119 Type 2 diabetes mellitus without complications: Secondary | ICD-10-CM

## 2020-02-17 DIAGNOSIS — I10 Essential (primary) hypertension: Secondary | ICD-10-CM

## 2020-02-17 MED ORDER — ACCU-CHEK AVIVA PLUS W/DEVICE KIT: PACK | 0 refills | Status: DC

## 2020-02-17 MED ORDER — ACCU-CHEK AVIVA PLUS VI STRP: ORAL_STRIP | 0 refills | Status: DC

## 2020-02-17 MED ORDER — ACCU-CHEK SOFT TOUCH LANCETS MISC: 3 refills | Status: AC

## 2020-02-17 MED ORDER — BLOOD PRESSURE MONITOR KIT
PACK | 0 refills | Status: AC
Start: 2020-02-17 — End: ?

## 2020-02-17 NOTE — Chronic Care Management (AMB) (Signed)
Chronic Care Management Pharmacy  Name: Wendy [name]  MRN: [id number] DOB: 10-20-1945  Initial Questions: 1. Have you seen any other providers since your last visit? Yes - Dr. Sarajane Jews visit  2. Any changes in your medicines or health? No   Chief Complaint/ HPI  Wendy [name],  74 y.o. , female presents for their Follow-Up CCM visit with the clinical pharmacist via telephone due to COVID-19 Pandemic.   PCP : Laurey Morale, MD  Their chronic conditions include: DM, HTN, Edema, RA, Diabetic neuropathy,N/V, anxiety, allergic rhinitis, tobacco use, low back pain syndrome  Office Visits: 12/16/19 [name], MD: Patient presented for virtual visit for pain management. Norco 10-325 refilled for low back pain. Influenza vaccine administered.   09/20/2019- [name], MD- patient presented for virtual visit for pain management. Norco 10-325 refilled for low back pain. Drug panel positive for opiates, marijuana, and negative for benzos.  09/16/19 [name], MD - patient presented for preventative health care visit. A1c increased from 6.5 to 6.8%. LDL increased to 100. Pt reported not checking her blood sugars at home but BP is stable. Recommended setting up another colonoscopy.  06/17/2019- [name], MD- patient presented for virtual visit for pain management. Norco 10-325 refilled for low back pain.   Medications: Outpatient Encounter Medications as of 02/17/2020  Medication Sig  . ALPRAZolam (XANAX) 1 MG tablet Take 1 tablet (1 mg total) by mouth at bedtime as needed for anxiety or sleep.  [name] atorvastatin (LIPITOR) 10 MG tablet Take 1 tablet (10 mg total) by mouth daily.  [name] b complex vitamins tablet Take 1 tablet by mouth daily.  [name] CALCIUM-VITAMIN D PO Take 1 tablet by mouth daily as needed (supplemental).   . Cholecalciferol (VITAMIN D3) 20 MCG (800 UNIT) TABS Take 1 tablet by mouth daily.   Mariane Baumgarten Calcium (STOOL SOFTENER PO) Take by mouth.  . fluticasone (FLONASE) 50 MCG/ACT  nasal spray SPRAY 2 SPRAYS INTO EACH NOSTRIL EVERY DAY  . furosemide (LASIX) 20 MG tablet TAKE 1 TABLET (20 MG TOTAL) BY MOUTH DAILY AS NEEDED (FLUID RETENTION).  [name] gabapentin (NEURONTIN) 100 MG capsule TAKE 1 CAPSULE BY MOUTH TWICE A DAY  . Ginkgo Biloba (GNP GINGKO BILOBA EXTRACT PO) Take by mouth.  [name] glipiZIDE (GLUCOTROL) 5 MG tablet Take 1 tablet (5 mg total) by mouth 2 (two) times daily before a meal.  . HYDROcodone-acetaminophen (NORCO) 10-325 MG tablet Take 1 tablet by mouth every 6 (six) hours as needed for severe pain.  [name] levocetirizine (XYZAL) 5 MG tablet TAKE 1 TABLET BY MOUTH EVERY DAY IN THE EVENING  . lisinopril-hydrochlorothiazide (ZESTORETIC) 20-25 MG tablet Take 1 tablet by mouth daily.  . methocarbamol (ROBAXIN) 500 MG tablet Take 1 tablet (500 mg total) by mouth at bedtime.  . Misc Natural Products (GINSENG COMPLEX PO) Take by mouth. As needed for energy  . naproxen sodium (ALEVE) 220 MG tablet Take 220 mg by mouth 2 (two) times daily as needed.  [name] omeprazole (PRILOSEC) 40 MG capsule Take 1 capsule (40 mg total) by mouth daily.  [name] OVER THE COUNTER MEDICATION Active Liver Daily  . potassium chloride (KLOR-CON) 10 MEQ tablet Take 1 tablet (10 mEq total) by mouth daily.  . [DISCONTINUED] ACCU-CHEK AVIVA PLUS test strip TEST ONCE DAILY   . [DISCONTINUED] hydroxychloroquine (PLAQUENIL) 200 MG tablet TAKE 1 TABLET BY MOUTH EVERY DAY  . [DISCONTINUED] Lancets (ACCU-CHEK SOFT TOUCH) lancets Test once per day and diagnosis code is E 11.9 and dispense  for Aviva plus  . [DISCONTINUED] ondansetron (ZOFRAN-ODT) 4 MG disintegrating tablet TAKE 1 TABLET BY MOUTH EVERY 8 HOURS AS NEEDED FOR NAUSEA AND VOMITING   No facility-administered encounter medications on file as of 02/17/2020.    Current Diagnosis/Assessment:  Goals Addressed            This Visit's Progress   . Pharmacy Care Plan       CARE PLAN ENTRY (see longitudinal plan of care for additional care plan  information)  Current Barriers:  . Chronic Disease Management support, education, and care coordination needs related to Hypertension, Diabetes, and Edema, RA, Diabetic neuropathy, low back pain syndrome    Hypertension BP Readings from Last 3 Encounters:  12/16/19 (!) 158/76  11/23/19 (!) 184/100  09/16/19 140/70   . Pharmacist Clinical Goal(s): o Over the next 30 days, patient will work with PharmD and providers to achieve BP goal <130/80 . Current regimen:  o Lisinopril-HCTZ 20-96m, 1 tablet once daily . Patient self care activities - Over the next 30 days, patient will: o Check blood pressure daily, document, and provide at future appointments o Ensure daily salt intake < 2300 mg/day  Hyperlipidemia Lab Results  Component Value Date   LDLCALC 100 (H) 09/16/2019   . Pharmacist Clinical Goal(s): o Over the next 90 days, patient will work with PharmD and providers to achieve LDL goal < 100 . Current regimen:  o Atorvastatin 20 mg 1 tablet daily . Interventions: o We discussed the importance of taking a cholesterol medication on lowering risk of heart attacks and strokes . Patient self care activities - Over the next 90 days, patient will: o Take atorvastatin every day  Diabetes Lab Results  Component Value Date/Time   HGBA1C 6.8 (H) 09/16/2019 03:11 PM   HGBA1C 6.5 03/10/2017 03:18 PM   HGBA1C 6.8 (H) 01/07/2017 04:50 PM   . Pharmacist Clinical Goal(s): o Over the next 90 days, patient will work with PharmD and providers to maintain A1c goal <7% . Current regimen:  o Glipizide 535m 1 tablet twice daily before a meal  . Interventions: o We discussed: how to recognize and treat signs of hypoglycemia o Recommended yearly diabetic eye exams.  . Patient self care activities - Over the next 90 days, patient will: o Contact provider with any episodes of hypoglycemia o Continue current medications.  Edema . Pharmacist Clinical Goal(s) o Over the next 90 days, patient  will work with PharmD and providers to minimize swelling/ fluid retention. . Current regimen:  . Furosemide 2059m1 tablet once daily as needed for fluid retention . Potassium chloride 60m30m1 tablet once daily  . Patient self care activities - Over the next 90 days, patient will: o Continue current medications as directed and report increased swelling.   Rheumatoid arthritis  . Pharmacist Clinical Goal(s) o Over the next 90 days, patient will work with PharmD and providers to minimize pain and inflammation associated with rheumatoid arthritis.  . Current regimen:  o Hydroxychloroquine 200mg62mtablet once daily  . Patient self care activities o Patient will continue current medications and follow up visits with rheumatologist.  Diabetic neuropathy . Pharmacist Clinical Goal(s) o Over the next 90 days, patient will work with PharmD and providers to minimize nerve pain . Current regimen:  o Gabapentin 100mg,41mapsule twice daily . Patient self care activities o Patient will continue current medications as instructed.   Low back pain syndrome  . Pharmacist Clinical Goal(s) o Over the next  90 days, patient will work with PharmD and providers to control pain level.  . Current regimen:  o Hydrocodone/ APAP 10/375m, 1 tablet every six hours as needed for moderate pain . Patient self care activities o Patient will continue current medications as instructed and schedule follow up visit with Dr. FSarajane Jewsfor pain management.   Medication management . Pharmacist Clinical Goal(s): o Over the next 90 days, patient will work with PharmD and providers to achieve optimal medication adherence . Current [facility] . Interventions o Comprehensive medication review performed. o Continue current medication management strategy . Patient self care activities - Over the next 90 days, patient will: o Take medications as prescribed o Report any questions or concerns to PharmD and/or  provider(s)  Please see past updates related to this goal by clicking on the "Past Updates" button in the selected goal         Diabetes  A1C goal < 7.0  Recent Relevant Labs: Lab Results  Component Value Date/Time   HGBA1C 6.8 (H) 09/16/2019 03:11 PM   HGBA1C 6.5 03/10/2017 03:18 PM   HGBA1C 6.8 (H) 01/07/2017 04:50 PM   MICROALBUR 0.3 10/19/2013 09:44 AM   MICROALBUR 1.2 03/30/2009 03:00 PM    Checking BG: 2x per Day - in morning and in evening (before meals)  Recent BG Readings: 140s and lower (could not provide specific readings) Lowest: 70s   Patient has failed these meds in past: metformin (stomach upset), Januvia (low BGs)   Patient is currently controlled on the following medications:   Glipizide 526m 1 tablet twice daily before a meal   Last diabetic Eye exam:  Lab Results  Component Value Date/Time   HMDIABEYEEXA No Retinopathy 05/22/2015 10:11 AM    Last diabetic Foot exam: No results found for: HMDIABFOOTEX   We discussed: how to recognize and treat signs of hypoglycemia  -Patient stopped taking glipizide for 2 weeks and then restarted and hasn't had any low BGs since restarting    Plan Continue current medications  Sent message to CMA to request new BG testing supplies per patient request. Plan to follow up in a few weeks for specific BG readings.  Hypertension  Denies dizziness/ lightheadedness.   Office blood pressures are  BP Readings from Last 3 Encounters:  12/16/19 (!) 158/76  11/23/19 (!) 184/100  09/16/19 140/70    Patient has failed these meds in the past: none   Patient checks BP at home daily  Patient home BP readings are ranging: "been good"; 117-118/78-80  Patient is controlled on:   Lisinopril-HCTZ 20-2550m1 tablet once daily   We discussed:   Plan Continue current medications  Patient will check blood pressure AFTER taking medication in the morning to ensure that her BP is not too low at home. Patient will keep a  log of BP readings. Plan to follow up in a few weeks for specific BP readings.   Primary prevention ASCVD    LDL goal < 70  Lipid Panel     Component Value Date/Time   CHOL 179 09/16/2019 1511   TRIG 107 09/16/2019 1511   HDL 59 09/16/2019 1511   LDLCALC 100 (H) 09/16/2019 1511   LDLDIRECT 143.6 06/05/2010 1021    Hepatic Function Latest Ref Rng & Units 09/16/2019 12/24/2016 09/19/2016  Total Protein 6.1 - 8.1 g/dL 6.7 7.9 7.0  Albumin 3.5 - 5.0 g/dL - 3.7 3.7  AST 10 - 35 U/L 17 20 13   ALT 6 - 29 U/L 15  13(L) 11  Alk Phosphatase 38 - 126 U/L - 69 64  Total Bilirubin 0.2 - 1.2 mg/dL 0.8 1.2 0.6  Bilirubin, Direct 0.0 - 0.2 mg/dL 0.2 - 0.1     The 10-year ASCVD risk score Mikey Bussing DC Jr., et al., 2013) is: 61%   Values used to calculate the score:     Age: 12 years     Sex: Female     Is Non-Hispanic African American: Yes     Diabetic: Yes     Tobacco smoker: Yes     Systolic Blood Pressure: 160 mmHg     Is BP treated: Yes     HDL Cholesterol: 59 mg/dL     Total Cholesterol: 179 mg/dL     Patient has failed these meds in past: none Patient is currently uncontrolled on the following medications:  . Atorvastatin 53m, 1 tablet once daily - not taking consistently  We discussed: the importance of taking a statin medication for both LDL lowering and reducing risk of ASCVD  Plan Patient will take atorvastatin daily. Continue current medications  Dependent edema     Reports not taking daily, about 3 to 4x per week   BMP Latest Ref Rng & Units 09/16/2019 01/07/2017 12/24/2016  Potassium 3.5 - 5.3 mmol/L 4.0 4.3 3.1(L)    Patient is currently controlled on the following medications:  . Furosemide 260m 1 tablet once daily as needed for fluid retention . Potassium chloride 1066m 1 tablet once daily   Plan Continue current medications  RA   Patient is currently controlled on the following medications:  . Hydroxychloroquine 200m33m tablet once daily   Plan Managed  by rheumatology.  Continue current medications  Diabetic neuropathy    Patient is currently controlled on the following medications:   Gabapentin 100mg71mcapsule twice daily  Plan Continue current medications  Low back pain syndrome   Reports having 4-5 back surgeries   Denies daytime drowsiness.   Patient is currently controlled on the following medications:  . Hydrocodone/ APAP 10/325mg,79mablet every six hours as needed for moderate pain (takes 3-4x per day)   Plan Continue current medications  Nausea/ vomiting   Patient is currently controlled on the following medications:  . Ondansetron ODT 4mg, 186mblet every eight hours as needed for nausea or vomiting  Plan Continue current medications  Anxiety   Patient is currently controlled on the following medications:  . Alprazolam (Xanax) 1mg, 1 34mlet at bedtime as needed for anxiety or sleep   Plan Continue current medications. Plan to discuss need for this during next follow up.  Bone health   Last DEXA Scan: 03/24/2011  T-Score forearm radius: -0.6  10-year probability of major osteoporotic fracture: 3.8%  10-year probability of hip fracture: 0.3%  Telephone encounter requesting bone density order to [facility]  No results found for: VD25OH   Patient is not a candidate for pharmacologic treatment  Patient is currently controlled on the following medications:   Vitamin D3 800 units, 1 tablet once daily    We discussed:  Recommend [phone] units of vitamin D daily. Recommend 1200 mg of calcium daily from dietary and supplemental sources.  Plan Continue current medications    Allergic rhinitis    Patient is currently controlled on the following medications:  . levocetirizine 5mg, 1 t73met every evening  . Fluticasone 50mcg/act45mal spray, 2 sprays into each nostril every day   Plan Continue current medications   Tobacco Use  Tobacco Status:  Social History   Tobacco Use  Smoking  Status Current Some Day Smoker  . Packs/day: 0.03  . Years: 45.00  . Pack years: 1.35  . Types: Cigars  Smokeless Tobacco Never Used  Tobacco Comment   1 black and mild every 2-3 days- no cigs - doesnt smoke the whole thing     Patient is currently uncontrolled on the following medications: no medications  Plan Readdress at follow up.   Medication Management   [facility] Uses pill box? No - reports medications in separate bags. Has own management system.Reports never forgetting to take it.   Pt endorses 100% compliance (with everything except atorvastatin)  We discussed: compliance with medications in order to prevent heart events and hospitalizations   Plan  Continue current medication management strategy   Follow up: 3 month phone visit for DM, HLD, and smoking cessation. Plan to follow up in a few weeks for specific BG and BP readings.   [name], PharmD Clinical Pharmacist [place] [facility] [place]

## 2020-02-17 NOTE — Telephone Encounter (Signed)
-----   Message from Viona Gilmore, [facility] sent at 02/17/2020 11:53 AM EST ----- Regarding: BG testing supplies and BP cuff Hi,  I just spoke with [name] and she requested a new prescription for BG testing supplies (meter, test strips and lancets) along with a BP cuff to [facility]. For the BG testing supplies, please include in the comments for them to dispense the preferred brand according to her insurance.  Thank you so much! Maddie

## 2020-02-22 ENCOUNTER — Other Ambulatory Visit: Payer: Self-pay | Admitting: Family Medicine

## 2020-03-02 DIAGNOSIS — Z1231 Encounter for screening mammogram for malignant neoplasm of breast: Secondary | ICD-10-CM | POA: Diagnosis not present

## 2020-03-02 LAB — HM MAMMOGRAPHY

## 2020-03-08 ENCOUNTER — Encounter: Payer: Self-pay | Admitting: Family Medicine

## 2020-03-21 ENCOUNTER — Encounter: Payer: Self-pay | Admitting: Family Medicine

## 2020-03-21 ENCOUNTER — Telehealth (INDEPENDENT_AMBULATORY_CARE_PROVIDER_SITE_OTHER): Payer: Medicare HMO | Admitting: Family Medicine

## 2020-03-21 DIAGNOSIS — M5440 Lumbago with sciatica, unspecified side: Secondary | ICD-10-CM | POA: Diagnosis not present

## 2020-03-21 DIAGNOSIS — F1190 Opioid use, unspecified, uncomplicated: Secondary | ICD-10-CM

## 2020-03-21 DIAGNOSIS — M5441 Lumbago with sciatica, right side: Secondary | ICD-10-CM

## 2020-03-21 DIAGNOSIS — G8929 Other chronic pain: Secondary | ICD-10-CM

## 2020-03-21 MED ORDER — HYDROCODONE-ACETAMINOPHEN 10-325 MG PO TABS
1.0000 | ORAL_TABLET | Freq: Four times a day (QID) | ORAL | 0 refills | Status: DC | PRN
Start: 2020-03-21 — End: 2020-03-21

## 2020-03-21 MED ORDER — HYDROCODONE-ACETAMINOPHEN 10-325 MG PO TABS
1.0000 | ORAL_TABLET | Freq: Four times a day (QID) | ORAL | 0 refills | Status: AC | PRN
Start: 2020-05-19 — End: 2020-06-18

## 2020-03-21 MED ORDER — HYDROCODONE-ACETAMINOPHEN 10-325 MG PO TABS: 1.0000 | ORAL_TABLET | Freq: Four times a day (QID) | ORAL | 0 refills | Status: DC | PRN

## 2020-03-21 MED ORDER — ALPRAZOLAM 1 MG PO TABS: 1.0000 mg | ORAL_TABLET | Freq: Every evening | ORAL | 5 refills | Status: DC | PRN

## 2020-03-21 NOTE — Progress Notes (Addendum)
   Subjective:    Patient ID: Wendy [name], female    DOB: 1945/03/20, 75 y.o.   MRN: [id number]  HPI Here for pain management, she is doing well.  Indication for chronic opioid: low back pain Medication and dose: Norco 10-325 # pills per month: 120 Last UDS date: 09-23-19 Opioid Treatment Agreement signed (Y/N): 06-19-17 Opioid Treatment Agreement last reviewed with patient:  03-21-20 [facility] reviewed this encounter (include red flags): Yes Virtual Visit via Telephone Note  I connected with the patient on 03/21/20 at  2:00 PM EST by telephone and verified that I am speaking with the correct person using two identifiers.   I discussed the limitations, risks, security and privacy concerns of performing an evaluation and management service by telephone and the availability of in person appointments. I also discussed with the patient that there may be a patient responsible charge related to this service. The patient expressed understanding and agreed to proceed.  Location patient: home Location provider: work or home office Participants present for the call: patient, provider Patient did not have a visit in the prior 7 days to address this/these issue(s).   History of Present Illness:    Observations/Objective: Patient sounds cheerful and well on the phone. I do not appreciate any SOB. Speech and thought processing are grossly intact. Patient reported vitals:  Assessment and Plan:   Follow Up Instructions:     [id number] [id number] I did not refer this patient for an OV in the next 24 hours for this/these issue(s).  I discussed the assessment and treatment plan with the patient. The patient was provided an opportunity to ask questions and all were answered. The patient agreed with the plan and demonstrated an understanding of the instructions.   The patient was advised to call back or seek an in-person evaluation if the symptoms worsen or if the condition  fails to improve as anticipated.  I provided 12 minutes of non-face-to-face time during this encounter.   [name], MD    Review of Systems     Objective:   Physical Exam        Assessment & Plan:  Pain management, meds were refilled.  [name], MD

## 2020-03-28 ENCOUNTER — Other Ambulatory Visit: Payer: Self-pay | Admitting: Family Medicine

## 2020-05-16 ENCOUNTER — Other Ambulatory Visit: Payer: Self-pay | Admitting: Family Medicine

## 2020-05-17 ENCOUNTER — Ambulatory Visit (INDEPENDENT_AMBULATORY_CARE_PROVIDER_SITE_OTHER): Payer: Medicare HMO | Admitting: Pharmacist

## 2020-05-17 DIAGNOSIS — E119 Type 2 diabetes mellitus without complications: Secondary | ICD-10-CM

## 2020-05-17 DIAGNOSIS — I10 Essential (primary) hypertension: Secondary | ICD-10-CM | POA: Diagnosis not present

## 2020-05-17 NOTE — Progress Notes (Signed)
Chronic Care Management Pharmacy Note  05/29/2020 Name:  Wendy [name] MRN:  [id number] DOB:  10/23/1945  Subjective: Wendy [name] is an 75 y.o. year old female who is a primary patient of Laurey Morale, MD.  The CCM team was consulted for assistance with disease management and care coordination needs.    Engaged with patient by telephone for follow up visit in response to provider referral for pharmacy case management and/or care coordination services.   Consent to Services:  The patient was given information about Chronic Care Management services, agreed to services, and gave verbal consent prior to initiation of services.  Please see initial visit note for detailed documentation.   Patient Care Team: Laurey Morale, MD as PCP - General Viona Gilmore, [facility] as Pharmacist (Pharmacist)  Recent office visits: 03/21/20 [name], MD: Patient presented for video visit for pain management. Refilled hydrocodone-APAP 10-349m.   12/16/19 [name]: Patient presented for visit for pain management. Norco 10-325 refilled for low back pain. Influenza vaccine administered.   09/20/2019- [name]- patient presented for virtual visit for pain management. Norco 10-325 refilled for low back pain. Drug panel positive for opiates, marijuana, and negative for benzos.  Recent consult visits: 03/02/20 Patient completed mammogram.  Hospital visits: None in previous 6 months  Objective:  Lab Results  Component Value Date   CREATININE 1.07 (H) 09/16/2019   BUN 13 09/16/2019   GFR 63.15 01/07/2017   GFRNONAA 59 (L) 12/24/2016   GFRAA >60 12/24/2016   NA 142 09/16/2019   K 4.0 09/16/2019   CALCIUM 9.5 09/16/2019   CO2 31 09/16/2019   GLUCOSE 99 09/16/2019    Lab Results  Component Value Date/Time   HGBA1C 6.8 (H) 09/16/2019 03:11 PM   HGBA1C 6.5 03/10/2017 03:18 PM   HGBA1C 6.8 (H) 01/07/2017 04:50 PM   GFR 63.15 01/07/2017 04:50 PM   GFR 77.68 09/19/2016 11:44 AM    MICROALBUR 0.3 10/19/2013 09:44 AM   MICROALBUR 1.2 03/30/2009 03:00 PM    Last diabetic Eye exam:  Lab Results  Component Value Date/Time   HMDIABEYEEXA No Retinopathy 05/22/2015 10:11 AM    Last diabetic Foot exam: No results found for: HMDIABFOOTEX   Lab Results  Component Value Date   CHOL 179 09/16/2019   HDL 59 09/16/2019   LDLCALC 100 (H) 09/16/2019   LDLDIRECT 143.6 06/05/2010   TRIG 107 09/16/2019   CHOLHDL 3.0 09/16/2019    Hepatic Function Latest Ref Rng & Units 09/16/2019 12/24/2016 09/19/2016  Total Protein 6.1 - 8.1 g/dL 6.7 7.9 7.0  Albumin 3.5 - 5.0 g/dL - 3.7 3.7  AST 10 - 35 U/L 17 20 13   ALT 6 - 29 U/L 15 13(L) 11  Alk Phosphatase 38 - 126 U/L - 69 64  Total Bilirubin 0.2 - 1.2 mg/dL 0.8 1.2 0.6  Bilirubin, Direct 0.0 - 0.2 mg/dL 0.2 - 0.1    Lab Results  Component Value Date/Time   TSH 0.80 09/16/2019 03:11 PM   TSH 0.76 09/19/2016 11:44 AM   FREET4 0.74 08/25/2011 12:17 PM    CBC Latest Ref Rng & Units 09/16/2019 12/24/2016 09/19/2016  WBC 3.8 - 10.8 Thousand/uL 5.3 10.3 5.3  Hemoglobin 11.7 - 15.5 g/dL 13.2 13.9 13.0  Hematocrit 35.0 - 45.0 % 40.8 42.1 40.0  Platelets 140 - 400 Thousand/uL 205 197 191.0    No results found for: VD25OH  Clinical ASCVD: No  The 10-year ASCVD risk score (Goff [place]  Brooke Bonito., et al., 2013) is: 61%   Values used to calculate the score:     Age: 58 years     Sex: Female     Is Non-Hispanic African American: Yes     Diabetic: Yes     Tobacco smoker: Yes     Systolic Blood Pressure: 568 mmHg     Is BP treated: Yes     HDL Cholesterol: 59 mg/dL     Total Cholesterol: 179 mg/dL    Depression screen [facility] 2/9 10/11/2019 12/21/2017 04/18/2016  Decreased Interest 0 1 0  Down, Depressed, Hopeless 0 0 0  PHQ - 2 Score 0 1 0  Altered sleeping 0 - -  Tired, decreased energy 0 - -  Change in appetite 0 - -  Feeling bad or failure about yourself  0 - -  Trouble concentrating 0 - -  Moving slowly or fidgety/restless 0 - -   Suicidal thoughts 0 - -  PHQ-9 Score 0 - -  Difficult doing work/chores Not difficult at all - -      Social History   Tobacco Use  Smoking Status Current Some Day Smoker  . Packs/day: 0.03  . Years: 45.00  . Pack years: 1.35  . Types: Cigars  Smokeless Tobacco Never Used  Tobacco Comment   1 black and mild every 2-3 days- no cigs - doesnt smoke the whole thing    BP Readings from Last 3 Encounters:  12/16/19 (!) 158/76  11/23/19 (!) 184/100  09/16/19 140/70   Pulse Readings from Last 3 Encounters:  12/16/19 87  11/23/19 74  03/23/18 82   Wt Readings from Last 3 Encounters:  12/16/19 163 lb (73.9 kg)  11/23/19 166 lb (75.3 kg)  11/09/19 166 lb (75.3 kg)   BMI Readings from Last 3 Encounters:  12/16/19 28.87 kg/m  11/23/19 29.41 kg/m  11/09/19 29.41 kg/m    Assessment/Interventions: Review of patient past medical history, allergies, medications, health status, including review of consultants reports, laboratory and other test data, was performed as part of comprehensive evaluation and provision of chronic care management services.   SDOH:  (Social Determinants of Health) assessments and interventions performed: No   CCM Care Plan  Allergies  Allergen Reactions  . Aspirin Nausea And Vomiting  . Tramadol Nausea And Vomiting    Medications Reviewed Today    Reviewed by Laurey Morale, MD (Physician) on 03/21/20 at 1436  Med List Status: <None>  Medication Order Taking? Sig Documenting Provider Last Dose Status Informant  ALPRAZolam (XANAX) 1 MG tablet [id number]  Take 1 tablet (1 mg total) by mouth at bedtime as needed for anxiety or sleep. Laurey Morale, MD  Active   atorvastatin (LIPITOR) 10 MG tablet [id number]  Take 1 tablet (10 mg total) by mouth daily. Laurey Morale, MD  Active   b complex vitamins tablet [id number]  Take 1 tablet by mouth daily. [provider]  Active Self  Blood Glucose Monitoring Suppl (ACCU-CHEK GUIDE) w/Device KIT  [id number]  Use to test sugars daily. Laurey Morale, MD  Active   Blood Pressure Monitor KIT [id number]  Use to check blood pressure. Laurey Morale, MD  Active   CALCIUM-VITAMIN D PO [id number]  Take 1 tablet by mouth daily as needed (supplemental).  [provider]  Active Self  Cholecalciferol (VITAMIN D3) 20 MCG (800 UNIT) TABS [id number]  Take 1 tablet by mouth daily.  [provider]  Active Self  Docusate Calcium (STOOL  SOFTENER PO) [id number]  Take by mouth. [provider]  Active   fluticasone (FLONASE) 50 MCG/ACT nasal spray [id number]  SPRAY 2 SPRAYS INTO EACH NOSTRIL EVERY DAY Laurey Morale, MD  Active   furosemide (LASIX) 20 MG tablet [id number]  TAKE 1 TABLET (20 MG TOTAL) BY MOUTH DAILY AS NEEDED (FLUID RETENTION). Laurey Morale, MD  Active   gabapentin (NEURONTIN) 100 MG capsule [id number]  TAKE 1 CAPSULE BY MOUTH TWICE A DAY Laurey Morale, MD  Active   Ginkgo Biloba (GNP GINGKO BILOBA EXTRACT PO) [id number]  Take by mouth. [provider]  Active   glipiZIDE (GLUCOTROL) 5 MG tablet [id number]  Take 1 tablet (5 mg total) by mouth 2 (two) times daily before a meal. Laurey Morale, MD  Active   glucose blood (ACCU-CHEK AVIVA PLUS) test strip [id number]  TEST ONCE DAILY Laurey Morale, MD  Active   HYDROcodone-acetaminophen [facility]) 10-325 MG tablet [id number]  Take 1 tablet by mouth every 6 (six) hours as needed for severe pain. Laurey Morale, MD  Active   hydroxychloroquine (PLAQUENIL) 200 MG tablet [id number]  TAKE 1 TABLET BY MOUTH EVERY DAY Laurey Morale, MD  Active   Lancets (ACCU-CHEK SOFT [facility]) lancets [id number]  Test once per day and diagnosis code is E 11.9 and dispense for Aviva plus Laurey Morale, MD  Active   levocetirizine (XYZAL) 5 MG tablet [id number]  TAKE 1 TABLET BY MOUTH EVERY DAY IN THE Billee Cashing, MD  Active   lisinopril-hydrochlorothiazide (ZESTORETIC) 20-25 MG tablet [id number]  Take 1 tablet by mouth daily. Laurey Morale,  MD  Active   methocarbamol (ROBAXIN) 500 MG tablet [id number] Yes Take 1 tablet (500 mg total) by mouth at bedtime. Laurey Morale, MD Taking Active   Misc Natural Products [facility] COMPLEX PO) [id number]  Take by mouth. As needed for energy [provider]  Active   naproxen sodium (ALEVE) 220 MG tablet [id number]  Take 220 mg by mouth 2 (two) times daily as needed. [provider]  Active   omeprazole (PRILOSEC) 40 MG capsule [id number]  Take 1 capsule (40 mg total) by mouth daily. Laurey Morale, MD  Active   ondansetron (ZOFRAN-ODT) 4 MG disintegrating tablet [id number]  TAKE 1 TABLET BY MOUTH EVERY 8 HOURS AS NEEDED FOR NAUSEA AND VOMITING Laurey Morale, MD  Active   OVER THE COUNTER MEDICATION [id number]  Active Liver Daily [provider]  Active   potassium chloride (KLOR-CON) 10 MEQ tablet [id number]  Take 1 tablet (10 mEq total) by mouth daily. Laurey Morale, MD  Active           Patient Active Problem List   Diagnosis Date Noted  . Diabetes mellitus without complication ([place]) 62/22/9798  . FOOT PAIN 05/23/2009  . ACUTE SINUSITIS, UNSPECIFIED 02/12/2009  . LOW BACK PAIN SYNDROME 07/21/2007  . DEPENDENT EDEMA 07/21/2007  . HEPATITIS C 02/08/2007  . GERD 11/20/2006  . CANDIDIASIS, VULVA/VAGINA 11/05/2006  . Diabetic neuropathy, type II diabetes mellitus ([place]) 11/05/2006  . Essential hypertension 11/05/2006  . GINGIVITIS, ACUTE, PLAQUE INDUCED 11/05/2006  . Rheumatoid arthritis ([place]) 11/05/2006  . 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    Immunization History  Administered Date(s) Administered  . Fluad Quad(high Dose 65+) 12/25/2018, 12/16/2019  . Influenza Whole 02/08/2007, 12/01/2008  . Influenza, High Dose Seasonal PF 01/07/2016, 12/17/2016, 12/21/2017  . Influenza,inj,Quad PF,6+ Mos 12/21/2012, 10/26/2013  . Influenza-Unspecified 12/17/2016  . Moderna SARS-COV2 Booster Vaccination 02/06/2020  .  Moderna Sars-Covid-2 Vaccination 05/13/2019, 06/13/2019  .  Pneumococcal Conjugate-13 01/07/2016  . Pneumococcal Polysaccharide-23 12/17/2016    Conditions to be addressed/monitored:  Hypertension, Diabetes, Anxiety, Osteoporosis, Tobacco use, Allergic Rhinitis and pain, neuropathy, edema  Care Plan : [place]  Updates made by Viona Gilmore, [place] since 05/29/2020 12:00 AM    Problem: Problem: Hypertension, Diabetes, Anxiety, Osteoporosis, Tobacco use, Allergic Rhinitis and pain, neuropathy, edema     Long-Range Goal: Patient-Specific Goal   Start Date: 05/17/2020  Expected End Date: 05/17/2021  This Visit's Progress: On track  Priority: High  Note:   Current Barriers:  . Unable to independently monitor therapeutic efficacy . Unable to achieve control of cholesterol and blood pressure  . Does not adhere to prescribed medication regimen  Pharmacist Clinical Goal(s):  [name] Patient will achieve adherence to monitoring guidelines and medication adherence to achieve therapeutic efficacy . achieve control of blood pressure as evidenced by blood pressure readings  through collaboration with PharmD and provider.   Interventions: . 1:1 collaboration with Laurey Morale, MD regarding development and update of comprehensive plan of care as evidenced by provider attestation and co-signature . Inter-disciplinary care team collaboration (see longitudinal plan of care) . Comprehensive medication review performed; medication list updated in electronic medical record  Hypertension (BP goal <140/90) -Uncontrolled -Current treatment:  Lisinopril-HCTZ 20-89m, 1 tablet once daily   Furosemide 20 mg 1 tablet daily -Medications previously tried: none  -Current home readings: could not provide readings -Current dietary habits: did not discuss -Current exercise habits: walks at work and doing arm rotations and leg rotations; using an exercise book at home from PT -Denies hypotensive/hypertensive symptoms -Educated on Exercise goal of 150 minutes  per week; Importance of home blood pressure monitoring; Proper BP monitoring technique; -Counseled to monitor BP at home at least weekly, document, and provide log at future appointments -Counseled on diet and exercise extensively Recommended to continue current medication  Hyperlipidemia: (LDL goal < 70) -Uncontrolled -Current treatment: . Atorvastatin 162m 1 tablet once daily -Medications previously tried: none  -Current dietary patterns: did not discuss -Current exercise habits: walks at work and doing arm rotations and leg rotations; using an exercise book at home from PT -Educated on Cholesterol goals;  Benefits of statin for ASCVD risk reduction; Importance of limiting foods high in cholesterol; Exercise goal of 150 minutes per week; -Counseled on diet and exercise extensively Recommended to continue current medication   Diabetes (A1c goal <7%) -Controlled -Current medications: . Marland Kitchenlipizide 48m63m1 tablet twice daily before a meal -Medications previously tried: metformin (stomach upset), Januvia (low BGs)  -Current home glucose readings . fasting glucose: 103-137 (in mornings and at night) . post prandial glucose: n/a -Denies hypoglycemic/hyperglycemic symptoms -Current meal patterns:  . breakfast: n/a  . lunch: n/a  . dinner: n/a . snacks: n/a . drinks: n/a -Current exercise: walks at work and doing arm rotations and leg rotations; using an exercise book at home from PT -Educated on A1c and blood sugar goals; Exercise goal of 150 minutes per week; Carbohydrate counting and/or plate method -Counseled to check feet daily and get yearly eye exams -Counseled on diet and exercise extensively Recommended to continue current medication Recommended for patient to set up an eye exam  Edema (Goal: minimize swelling) -Controlled -Current treatment   Furosemide 61m58m tablet once daily as needed for fluid retention  Potassium chloride 10mE56m tablet once daily   -Medications previously tried: none  -Recommended to continue current medication  Rheumatoid arthritis (Goal: minimize symptoms and manage pain) -Controlled -Current treatment  . Hydroxychloroquine 239m, 1 tablet once daily  -Medications previously tried: none  -Recommended to continue current medication  Neuropathy (Goal: minimize pain) -Controlled -Current treatment  . Gabapentin 1032m 1 capsule twice daily -Medications previously tried: none  -Recommended to continue current medication  Pain (Goal: minimize pain) -Controlled -Current treatment   Hydrocodone/ APAP 10/32572m1 tablet every six hours as needed for moderate pain (takes 3-4x per day)   Methocarbamol 500 mg 1 tablet at bedtime -Medications previously tried: n/a  -Recommended to continue current medication Counseled on not taking hydrocodone at the same time as alprazolam due to sedation  Anxiety (Goal: minimize symptoms) -Controlled -Current treatment: . Alprazolam (Xanax) 1mg6m tablet at bedtime as needed for anxiety or sleep  -Medications previously tried/failed: n/a -GAD7: n/a -Educated on Benefits of medication for symptom control Benefits of cognitive-behavioral therapy with or without medication -Recommended to continue current medication  Tobacco use (Goal quit smoking) -Controlled -Previous quit attempts: cold [place] treatment  . No medications -Patient smokes After 30 minutes of waking -Patient triggers include:  habit -On a scale of 1-10, reports MOTIVATION to quit is 10 -On a scale of 1-10, reports CONFIDENCE in quitting is 10 - Patient quit smoking a month ago and congratulated her on this accomplishment -Recommended to continue without smoking  Allergic rhinitis (Goal: minimize symptoms of allergies) -Controlled -Current treatment   levocetirizine 5mg,61mtablet every evening   Fluticasone 50mcg26m nasal spray, 2 sprays into each nostril every day  -Medications  previously tried: none  -Recommended to continue current medication Counseled on avoiding allergy triggers  GERD (Goal: minimize symptoms) -Controlled -Current treatment  . Omeprazole 40 mg 1 capsule daily -Medications previously tried: none  -Recommended to continue current medication   Health Maintenance -Vaccine gaps: tetanus, shingles -Current therapy:  . VitaMarland Kitchenin B complex 1 tablet daily . Ginkgo biloba 1 tablet daily . Ginseng complex daily . Active liver daily  Vitamin D3 1000 units, 1 tablet once daily  Calcium-mag-zinc daily  -Educated on Herbal supplement research is limited and benefits usually cannot be proven -Patient is satisfied with current therapy and denies issues -Educated on risk vs benefit of supplementation  Patient Goals/Self-Care Activities . Patient will:  - take medications as prescribed check glucose daily, document, and provide at future appointments check blood pressure weekly, document, and provide at future appointments  Follow Up Plan: Telephone follow up appointment with care management team member scheduled for: 4 months       Medication Assistance: None required.  Patient affirms current coverage meets needs.  Patient's preferred pharmacy is:  [id number] [id number] [phone] box? No - reports medications in separate bags. Has own management system.Reports never forgetting to take it.   Pt endorses 100% compliance  We discussed: Current pharmacy is preferred with insurance plan and patient is satisfied with pharmacy services Patient decided to: Continue current medication management strategy  Care Plan and Follow Up Patient Decision:  Patient agrees to Care Plan and Follow-up.  Plan: Telephone follow up appointment with care management team member scheduled for:  4 months  MadelinJeni SallesD  BCACP CGrimescist LeBauerHidalgossfiEl Valle de Ar[id number] [phone]

## 2020-05-29 NOTE — Patient Instructions (Addendum)
Hi Wendy [name],  It was great to get to speak with you again over the telephone! Below is a summary of some of the topics we discussed.  Please continue working on checking your blood pressure and blood sugars regularly and making some of those changes to your diet to lower your cholesterol and manage your blood pressure.  Please reach out to me if you have any questions or need anything before our follow up!  Best, Maddie  [name], PharmD, [facility]  Visit Information  Goals Addressed   None    Patient Care Plan: CCM Pharmacy Care Plan    Problem Identified: Problem: Hypertension, Diabetes, Anxiety, Osteoporosis, Tobacco use, Allergic Rhinitis and pain, neuropathy, edema     Long-Range Goal: Patient-Specific Goal   Start Date: 05/17/2020  Expected End Date: 05/17/2021  This Visit's Progress: On track  Priority: High  Note:   Current Barriers:  . Unable to independently monitor therapeutic efficacy . Unable to achieve control of cholesterol and blood pressure  . Does not adhere to prescribed medication regimen  Pharmacist Clinical Goal(s):  [name] Patient will achieve adherence to monitoring guidelines and medication adherence to achieve therapeutic efficacy . achieve control of blood pressure as evidenced by blood pressure readings  through collaboration with PharmD and provider.   Interventions: . 1:1 collaboration with Laurey Morale, MD regarding development and update of comprehensive plan of care as evidenced by provider attestation and co-signature . Inter-disciplinary care team collaboration (see longitudinal plan of care) . Comprehensive medication review performed; medication list updated in electronic medical record  Hypertension (BP goal <140/90) -Uncontrolled -Current treatment:  Lisinopril-HCTZ 20-25mg , 1 tablet once daily   Furosemide 20 mg 1 tablet daily -Medications previously tried: none   -Current home readings: could not provide readings -Current dietary habits: did not discuss -Current exercise habits: walks at work and doing arm rotations and leg rotations; using an exercise book at home from PT -Denies hypotensive/hypertensive symptoms -Educated on Exercise goal of 150 minutes per week; Importance of home blood pressure monitoring; Proper BP monitoring technique; -Counseled to monitor BP at home at least weekly, document, and provide log at future appointments -Counseled on diet and exercise extensively Recommended to continue current medication  Hyperlipidemia: (LDL goal < 70) -Uncontrolled -Current treatment: . Atorvastatin 10mg , 1 tablet once daily -Medications previously tried: none  -Current dietary patterns: did not discuss -Current exercise habits: walks at work and doing arm rotations and leg rotations; using an exercise book at home from PT -Educated on Cholesterol goals;  Benefits of statin for ASCVD risk reduction; Importance of limiting foods high in cholesterol; Exercise goal of 150 minutes per week; -Counseled on diet and exercise extensively Recommended to continue current medication   Diabetes (A1c goal <7%) -Controlled -Current medications: [name] Glipizide 5mg , 1 tablet twice daily before a meal -Medications previously tried: metformin (stomach upset), Januvia (low BGs)  -Current home glucose readings . fasting glucose: 103-137 (in mornings and at night) . post prandial glucose: n/a -Denies hypoglycemic/hyperglycemic symptoms -Current meal patterns:  . breakfast: n/a  . lunch: n/a  . dinner: n/a . snacks: n/a . drinks: n/a -Current exercise: walks at work and doing arm rotations and leg rotations; using an exercise book at home from PT -Educated on A1c and blood sugar goals; Exercise goal of 150 minutes per week; Carbohydrate counting and/or plate method -Counseled to check feet daily and get yearly eye exams -Counseled on diet and  exercise extensively  Recommended to continue current medication Recommended for patient to set up an eye exam  Edema (Goal: minimize swelling) -Controlled -Current treatment   Furosemide 20mg , 1 tablet once daily as needed for fluid retention  Potassium chloride 22mEq, 1 tablet once daily  -Medications previously tried: none  -Recommended to continue current medication  Rheumatoid arthritis (Goal: minimize symptoms and manage pain) -Controlled -Current treatment  . Hydroxychloroquine 200mg , 1 tablet once daily  -Medications previously tried: none  -Recommended to continue current medication  Neuropathy (Goal: minimize pain) -Controlled -Current treatment  . Gabapentin 100mg , 1 capsule twice daily -Medications previously tried: none  -Recommended to continue current medication  Pain (Goal: minimize pain) -Controlled -Current treatment   Hydrocodone/ APAP 10/325mg , 1 tablet every six hours as needed for moderate pain (takes 3-4x per day)   Methocarbamol 500 mg 1 tablet at bedtime -Medications previously tried: n/a  -Recommended to continue current medication Counseled on not taking hydrocodone at the same time as alprazolam due to sedation  Anxiety (Goal: minimize symptoms) -Controlled -Current treatment: . Alprazolam (Xanax) 1mg , 1 tablet at bedtime as needed for anxiety or sleep  -Medications previously tried/failed: n/a -GAD7: n/a -Educated on Benefits of medication for symptom control Benefits of cognitive-behavioral therapy with or without medication -Recommended to continue current medication  Tobacco use (Goal quit smoking) -Controlled -Previous quit attempts: cold [place] -Current treatment  . No medications -Patient smokes After 30 minutes of waking -Patient triggers include:  habit -On a scale of 1-10, reports MOTIVATION to quit is 10 -On a scale of 1-10, reports CONFIDENCE in quitting is 10 - Patient quit smoking a month ago and congratulated her on  this accomplishment -Recommended to continue without smoking  Allergic rhinitis (Goal: minimize symptoms of allergies) -Controlled -Current treatment   levocetirizine 5mg , 1 tablet every evening   Fluticasone 78mcg/act nasal spray, 2 sprays into each nostril every day  -Medications previously tried: none  -Recommended to continue current medication Counseled on avoiding allergy triggers  GERD (Goal: minimize symptoms) -Controlled -Current treatment  . Omeprazole 40 mg 1 capsule daily -Medications previously tried: none  -Recommended to continue current medication   Health Maintenance -Vaccine gaps: tetanus, shingles -Current therapy:  [name] Vitamin B complex 1 tablet daily . Ginkgo biloba 1 tablet daily . Ginseng complex daily . Active liver daily  Vitamin D3 1000 units, 1 tablet once daily  Calcium-mag-zinc daily  -Educated on Herbal supplement research is limited and benefits usually cannot be proven -Patient is satisfied with current therapy and denies issues -Educated on risk vs benefit of supplementation  Patient Goals/Self-Care Activities . Patient will:  - take medications as prescribed check glucose daily, document, and provide at future appointments check blood pressure weekly, document, and provide at future appointments  Follow Up Plan: Telephone follow up appointment with care management team member scheduled for: 4 months       Patient verbalizes understanding of instructions provided today and agrees to view in [place].  Telephone follow up appointment with pharmacy team member scheduled for:4 months  Viona Gilmore, [facility]  [email or web address]">  DASH Eating Plan DASH stands for Dietary Approaches to Stop Hypertension. The DASH eating plan is a healthy eating plan that has been shown to:  Reduce high blood pressure (hypertension).  Reduce your risk for type 2 diabetes, heart disease, and stroke.  Help  with weight loss. What are tips for following this plan? Reading food labels  Check food labels for the amount of salt (sodium) per serving. Choose  foods with less than 5 percent of the Daily Value of sodium. Generally, foods with less than 300 milligrams (mg) of sodium per serving fit into this eating plan.  To find whole grains, look for the word "whole" as the first word in the ingredient list. Shopping  Buy products labeled as "low-sodium" or "no salt added."  Buy fresh foods. Avoid canned foods and pre-made or frozen meals. Cooking  Avoid adding salt when cooking. Use salt-free seasonings or herbs instead of table salt or sea salt. Check with your health care provider or pharmacist before using salt substitutes.  Do not fry foods. Cook foods using healthy methods such as baking, boiling, grilling, roasting, and broiling instead.  Cook with heart-healthy oils, such as olive, canola, avocado, soybean, or sunflower oil. Meal planning  Eat a balanced diet that includes: ? 4 or more servings of fruits and 4 or more servings of vegetables each day. Try to fill one-half of your plate with fruits and vegetables. ? 6-8 servings of whole grains each day. ? Less than 6 oz (170 g) of lean meat, poultry, or fish each day. A 3-oz (85-g) serving of meat is about the same size as a deck of cards. One egg equals 1 oz (28 g). ? 2-3 servings of low-fat dairy each day. One serving is 1 cup (237 mL). ? 1 serving of nuts, seeds, or beans 5 times each week. ? 2-3 servings of heart-healthy fats. Healthy fats called omega-3 fatty acids are found in foods such as walnuts, flaxseeds, fortified milks, and eggs. These fats are also found in cold-water fish, such as sardines, salmon, and mackerel.  Limit how much you eat of: ? Canned or prepackaged foods. ? Food that is high in trans fat, such as some fried foods. ? Food that is high in saturated fat, such as fatty meat. ? Desserts and other sweets, sugary  drinks, and other foods with added sugar. ? Full-fat dairy products.  Do not salt foods before eating.  Do not eat more than 4 egg yolks a week.  Try to eat at least 2 vegetarian meals a week.  Eat more home-cooked food and less restaurant, buffet, and fast food.   Lifestyle  When eating at a restaurant, ask that your food be prepared with less salt or no salt, if possible.  If you drink alcohol: ? Limit how much you use to:  0-1 drink a day for women who are not pregnant.  0-2 drinks a day for men. ? Be aware of how much alcohol is in your drink. In the [place] one drink equals one 12 oz bottle of beer (355 mL), one 5 oz glass of wine (148 mL), or one 1 oz glass of hard liquor (44 mL). General information  Avoid eating more than 2,300 mg of salt a day. If you have hypertension, you may need to reduce your sodium intake to 1,500 mg a day.  Work with your health care provider to maintain a healthy body weight or to lose weight. Ask what an ideal weight is for you.  Get at least 30 minutes of exercise that causes your heart to beat faster (aerobic exercise) most days of the week. Activities may include walking, swimming, or biking.  Work with your health care provider or dietitian to adjust your eating plan to your individual calorie needs. What foods should I eat? Fruits All fresh, dried, or frozen fruit. Canned fruit in natural juice (without added sugar). Vegetables Fresh or frozen  vegetables (raw, steamed, roasted, or grilled). Low-sodium or reduced-sodium tomato and vegetable juice. Low-sodium or reduced-sodium tomato sauce and tomato paste. Low-sodium or reduced-sodium canned vegetables. Grains Whole-grain or whole-wheat bread. Whole-grain or whole-wheat pasta. Brown rice. [place]. Bulgur. Whole-grain and low-sodium cereals. Pita bread. Low-fat, low-sodium crackers. Whole-wheat flour tortillas. Meats and other proteins Skinless chicken or [place]. Ground chicken or  [place]. Pork with fat trimmed off. Fish and seafood. Egg whites. Dried beans, peas, or lentils. Unsalted nuts, nut butters, and seeds. Unsalted canned beans. Lean cuts of beef with fat trimmed off. Low-sodium, lean precooked or cured meat, such as sausages or meat loaves. Dairy Low-fat (1%) or fat-free (skim) milk. Reduced-fat, low-fat, or fat-free cheeses. Nonfat, low-sodium ricotta or cottage cheese. Low-fat or nonfat yogurt. Low-fat, low-sodium cheese. Fats and oils Soft margarine without trans fats. Vegetable oil. Reduced-fat, low-fat, or light mayonnaise and salad dressings (reduced-sodium). Canola, safflower, olive, avocado, soybean, and sunflower oils. Avocado. Seasonings and condiments Herbs. Spices. Seasoning mixes without salt. Other foods Unsalted popcorn and pretzels. Fat-free sweets. The items listed above may not be a complete list of foods and beverages you can eat. Contact a dietitian for more information. What foods should I avoid? Fruits Canned fruit in a light or heavy syrup. Fried fruit. Fruit in cream or butter sauce. Vegetables Creamed or fried vegetables. Vegetables in a cheese sauce. Regular canned vegetables (not low-sodium or reduced-sodium). Regular canned tomato sauce and paste (not low-sodium or reduced-sodium). Regular tomato and vegetable juice (not low-sodium or reduced-sodium). Angie Fava. Olives. Grains Baked goods made with fat, such as croissants, muffins, or some breads. Dry pasta or rice meal packs. Meats and other proteins Fatty cuts of meat. Ribs. Fried meat. Berniece Salines. Bologna, salami, and other precooked or cured meats, such as sausages or meat loaves. Fat from the back of a pig (fatback). Bratwurst. Salted nuts and seeds. Canned beans with added salt. Canned or smoked fish. Whole eggs or egg yolks. Chicken or [place] with skin. Dairy Whole or 2% milk, cream, and half-and-half. Whole or full-fat cream cheese. Whole-fat or sweetened yogurt. Full-fat cheese.  Nondairy creamers. Whipped toppings. Processed cheese and cheese spreads. Fats and oils Butter. Stick margarine. Lard. Shortening. Ghee. Bacon fat. Tropical oils, such as coconut, palm kernel, or palm oil. Seasonings and condiments Onion salt, garlic salt, seasoned salt, table salt, and sea salt. Worcestershire sauce. Tartar sauce. Barbecue sauce. Teriyaki sauce. Soy sauce, including reduced-sodium. Steak sauce. Canned and packaged gravies. Fish sauce. Oyster sauce. Cocktail sauce. Store-bought horseradish. Ketchup. Mustard. Meat flavorings and tenderizers. Bouillon cubes. Hot sauces. Pre-made or packaged marinades. Pre-made or packaged taco seasonings. Relishes. Regular salad dressings. Other foods Salted popcorn and pretzels. The items listed above may not be a complete list of foods and beverages you should avoid. Contact a dietitian for more information. Where to find more information  National Heart, Lung, and Blood Institute: https://wilson-eaton.com/  American Heart Association: www.heart.org  Academy of Nutrition and Dietetics: www.eatright.[place]: www.kidney.org Summary  The DASH eating plan is a healthy eating plan that has been shown to reduce high blood pressure (hypertension). It may also reduce your risk for type 2 diabetes, heart disease, and stroke.  When on the DASH eating plan, aim to eat more fresh fruits and vegetables, whole grains, lean proteins, low-fat dairy, and heart-healthy fats.  With the DASH eating plan, you should limit salt (sodium) intake to 2,300 mg a day. If you have hypertension, you may need to reduce your sodium intake to  1,500 mg a day.  Work with your health care provider or dietitian to adjust your eating plan to your individual calorie needs. This information is not intended to replace advice given to you by your health care provider. Make sure you discuss any questions you have with your health care provider. Document Revised:  01/21/2019 Document Reviewed: 01/21/2019 Elsevier Patient Education  2021 [employer].

## 2020-06-19 ENCOUNTER — Encounter: Payer: Self-pay | Admitting: Family Medicine

## 2020-06-19 ENCOUNTER — Telehealth (INDEPENDENT_AMBULATORY_CARE_PROVIDER_SITE_OTHER): Payer: Medicare HMO | Admitting: Family Medicine

## 2020-06-19 VITALS — Wt 163.0 lb

## 2020-06-19 DIAGNOSIS — G8929 Other chronic pain: Secondary | ICD-10-CM

## 2020-06-19 DIAGNOSIS — F1190 Opioid use, unspecified, uncomplicated: Secondary | ICD-10-CM

## 2020-06-19 DIAGNOSIS — M5440 Lumbago with sciatica, unspecified side: Secondary | ICD-10-CM | POA: Diagnosis not present

## 2020-06-19 MED ORDER — HYDROCODONE-ACETAMINOPHEN 10-325 MG PO TABS: 1.0000 | ORAL_TABLET | Freq: Four times a day (QID) | ORAL | 0 refills | Status: AC | PRN

## 2020-06-19 MED ORDER — HYDROCODONE-ACETAMINOPHEN 10-325 MG PO TABS: 1.0000 | ORAL_TABLET | Freq: Four times a day (QID) | ORAL | 0 refills | Status: DC | PRN

## 2020-06-19 NOTE — Progress Notes (Signed)
   Subjective:    Patient ID: Wendy [name], female    DOB: January 20, 1946, 75 y.o.   MRN: [id number]  HPI Virtual Visit via Telephone Note  I connected with the patient on 06/19/20 at  3:00 PM EDT by telephone and verified that I am speaking with the correct person using two identifiers.   I discussed the limitations, risks, security and privacy concerns of performing an evaluation and management service by telephone and the availability of in person appointments. I also discussed with the patient that there may be a patient responsible charge related to this service. The patient expressed understanding and agreed to proceed.  Location patient: home Location provider: work or home office Participants present for the call: patient, provider Patient did not have a visit in the prior 7 days to address this/these issue(s).   History of Present Illness: Here for pain management. She is doing well.    Observations/Objective: Patient sounds cheerful and well on the phone. I do not appreciate any SOB. Speech and thought processing are grossly intact. Patient reported vitals:  Assessment and Plan: Pain management, meds were refilled.  Indication for chronic opioid: low back pain  Medication and dose: Norco 10-325 # pills per month: 120 Last UDS date: 09-23-19 Opioid Treatment Agreement signed (Y/N): 06-19-17 Opioid Treatment Agreement last reviewed with patient:  06-19-20 NCCSRS reviewed this encounter (include red flags): Yes   Follow Up Instructions:     [id number] [id number] [id number] 11-20 [id number] 21-30 I did not refer this patient for an OV in the next 24 hours for this/these issue(s).  I discussed the assessment and treatment plan with the patient. The patient was provided an opportunity to ask questions and all were answered. The patient agreed with the plan and demonstrated an understanding of the instructions.   The patient was advised to call back or seek an in-person evaluation if the  symptoms worsen or if the condition fails to improve as anticipated.  I provided 12 minutes of non-face-to-face time during this encounter.   [name], MD    Review of Systems     Objective:   Physical Exam        Assessment & Plan:

## 2020-07-09 ENCOUNTER — Telehealth: Payer: Self-pay | Admitting: Pharmacist

## 2020-07-09 NOTE — Chronic Care Management (AMB) (Signed)
Chronic Care Management Pharmacy Assistant   Name: Wendy [name]  MRN: [id number] DOB: Jul 11, 1945  Reason for Encounter: Disease State/ General Assessment Call.   Conditions to be addressed/monitored: HTN and HLD.  Recent office visits:  06/19/20 Video visit with [name] MD (PCP) - seen for pain management. Patient started on Hydrocodone-Acetaminophen 10-355m. No follow up noted.   Recent consult visits:  None.   Hospital visits:  None in previous 6 months  Medications: Outpatient Encounter Medications as of 07/09/2020  Medication Sig  . ACCU-CHEK GUIDE test strip USE TO TEST ONCE DAILY  . ALPRAZolam (XANAX) 1 MG tablet Take 1 tablet (1 mg total) by mouth at bedtime as needed for anxiety or sleep.  .Marland Kitchenatorvastatin (LIPITOR) 10 MG tablet Take 1 tablet (10 mg total) by mouth daily.  .Marland Kitchenb complex vitamins tablet Take 1 tablet by mouth daily.  . Blood Glucose Monitoring Suppl (ACCU-CHEK GUIDE) w/Device KIT Use to test sugars daily.  . Blood Pressure Monitor KIT Use to check blood pressure.  .Marland KitchenCALCIUM-VITAMIN D PO Take 1 tablet by mouth daily as needed (supplemental).   . Cholecalciferol (VITAMIN D3) 20 MCG (800 UNIT) TABS Take 1 tablet by mouth daily.   .Mariane BaumgartenCalcium (STOOL SOFTENER PO) Take by mouth.  . fluticasone (FLONASE) 50 MCG/ACT nasal spray SPRAY 2 SPRAYS INTO EACH NOSTRIL EVERY DAY  . furosemide (LASIX) 20 MG tablet TAKE 1 TABLET (20 MG TOTAL) BY MOUTH DAILY AS NEEDED (FLUID RETENTION).  .Marland Kitchengabapentin (NEURONTIN) 100 MG capsule TAKE 1 CAPSULE BY MOUTH TWICE A DAY  . Ginkgo Biloba (GNP GINGKO BILOBA EXTRACT PO) Take by mouth.  .Marland KitchenglipiZIDE (GLUCOTROL) 5 MG tablet Take 1 tablet (5 mg total) by mouth 2 (two) times daily before a meal.  . [START ON 08/19/2020] HYDROcodone-acetaminophen (NORCO) 10-325 MG tablet Take 1 tablet by mouth every 6 (six) hours as needed for moderate pain.  . hydroxychloroquine (PLAQUENIL) 200 MG tablet TAKE 1 TABLET BY MOUTH EVERY DAY  .  Lancets (ACCU-CHEK SOFT TOUCH) lancets Test once per day and diagnosis code is E 11.9 and dispense for Aviva plus  . levocetirizine (XYZAL) 5 MG tablet TAKE 1 TABLET BY MOUTH EVERY DAY IN THE EVENING  . lisinopril-hydrochlorothiazide (ZESTORETIC) 20-25 MG tablet Take 1 tablet by mouth daily.  . methocarbamol (ROBAXIN) 500 MG tablet TAKE 1 TABLET (500 MG TOTAL) BY MOUTH AT BEDTIME.  .Marland KitchenMisc Natural Products (GINSENG COMPLEX PO) Take by mouth. As needed for energy  . naproxen sodium (ALEVE) 220 MG tablet Take 220 mg by mouth 2 (two) times daily as needed.  .Marland Kitchenomeprazole (PRILOSEC) 40 MG capsule Take 1 capsule (40 mg total) by mouth daily.  . ondansetron (ZOFRAN-ODT) 4 MG disintegrating tablet TAKE 1 TABLET BY MOUTH EVERY 8 HOURS AS NEEDED FOR NAUSEA AND VOMITING  . OVER THE COUNTER MEDICATION Active Liver Daily  . potassium chloride (KLOR-CON) 10 MEQ tablet Take 1 tablet (10 mEq total) by mouth daily.   No facility-administered encounter medications on file as of 07/09/2020.    Reviewed chart prior to disease state call. Spoke with patient regarding BP  Recent Office Vitals: BP Readings from Last 3 Encounters:  12/16/19 (!) 158/76  11/23/19 (!) 184/100  09/16/19 140/70   Pulse Readings from Last 3 Encounters:  12/16/19 87  11/23/19 74  03/23/18 82    Wt Readings from Last 3 Encounters:  06/19/20 163 lb (73.9 kg)  12/16/19 163 lb (73.9 kg)  11/23/19 166  lb (75.3 kg)     Kidney Function Lab Results  Component Value Date/Time   CREATININE 1.07 (H) 09/16/2019 03:11 PM   CREATININE 1.10 01/07/2017 04:50 PM   CREATININE 0.96 12/24/2016 02:03 PM   CREATININE 0.88 04/18/2016 03:35 PM   GFR 63.15 01/07/2017 04:50 PM   GFRNONAA 59 (L) 12/24/2016 02:03 PM   GFRAA >60 12/24/2016 02:03 PM    BMP Latest Ref Rng & Units 09/16/2019 01/07/2017 12/24/2016  Glucose 65 - 99 mg/dL 99 114(H) 167(H)  BUN 7 - 25 mg/dL 13 19 14   Creatinine 0.60 - 0.93 mg/dL 1.07(H) 1.10 0.96  BUN/Creat Ratio 6 - 22  (calc) 12 - -  Sodium 135 - 146 mmol/L 142 139 138  Potassium 3.5 - 5.3 mmol/L 4.0 4.3 3.1(L)  Chloride 98 - 110 mmol/L 103 103 103  CO2 20 - 32 mmol/L 31 30 28   Calcium 8.6 - 10.4 mg/dL 9.5 9.7 9.1    . Current antihypertensive regimen:  o Lisinopril-hydrochlorothiazide 20-69m - take once daily.  o Furosemide 28m- take daily.  . How often are you checking your Blood Pressure? twice daily  . Current home BP readings: 118/77 am and 131/88 pm on 07/10/20, 117/77 am today 07/11/20.   . What recent interventions/DTPs have been made by any provider to improve Blood Pressure control since last CPP Visit: None.   . Any recent hospitalizations or ED visits since last visit with CPP? No  . What diet changes have been made to improve Blood Pressure Control?  o Patient states she does add salt to her food but not a lot. Patient states she has been eating more vegetables such as broccoli, brussel sprouts and collard greens. Patient states she eats a lot of fruit and does not eat a lot of red meat. Patient states she drinks alot of ginger and turmeric teas.   . What exercise is being done to improve your Blood Pressure Control?  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 Patient states she has to lift and physically care for her patients such as bathing, dressing and feeding them. Patient states she cares for two clients and her sister. Patient states she helps around her sisters house and her house. Patient states she gets a lot of walking.    Patient stated that she stopped taking her lisinopril-HCTZ two days ago due to hearing bad things about the medication. Patient states that she has taken her sisters Amlodipine 1019mablets one tablet yesterday and the day before. Patient states she wants Dr. FrySarajane Jews switch her back to Amlodipine. She feels it helps her blood pressure more and she feels safer on that medication. Patient reports taking all other  medications and no changes.   Adherence Review: Is the patient currently on ACE/ARB medication? Yes Does the patient have >5 day gap between last estimated fill dates? No   Comprehensive medication review performed; Spoke to patient regarding cholesterol  Lipid Panel    Component Value Date/Time   CHOL 179 09/16/2019 1511   TRIG 107 09/16/2019 1511   HDL 59 09/16/2019 1511   LDLCALC 100 (H) 09/16/2019 1511   LDLDIRECT 143.6 06/05/2010 1021    10-year ASCVD risk score: The ASCVD Risk score (GoMikey Bussing Jr., et al., 2013) failed to calculate for the following reasons:   Unable to determine if patient is Non-Hispanic African American  . Current antihyperlipidemic regimen:  o Atorvastatin 31m30mtake once daily.   .[name]  Previous antihyperlipidemic medications tried: None.   . ASCVD risk enhancing conditions: age >25, DM, HTN and current smoker.  . What recent interventions/DTPs have been made by any provider to improve Cholesterol control since last CPP Visit: None.   . Any recent hospitalizations or ED visits since last visit with CPP? No  . What diet changes have been made to improve Cholesterol?  o Patient states that she tries to get in good fats like fish rich in omega fatty acids. Patient states she eats a lot of vegetables and green foods. Patient also states she eats a lot of fruit and likes to drink ginger and turmeric hot teas daily.   . What exercise is being done to improve Cholesterol?  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 Patient states she has to lift and physically care for her patients such as bathing, dressing and feeding them. Patient states she cares for two clients and her sister. Patient states she helps around her sisters house and her house. Patient states she gets a lot of walking.    Adherence Review: Does the patient have >5 day gap between last estimated fill dates? No   Star Rating Drugs:  Glipizide 62m -  [facility] At[facility]vastatin 134m- [facility]  Lisinopril-hydrochlorothiazide 20-2513m [phone]

## 2020-07-13 ENCOUNTER — Telehealth: Payer: Self-pay | Admitting: Family Medicine

## 2020-07-13 MED ORDER — AMLODIPINE BESYLATE 10 MG PO TABS: 10.0000 mg | ORAL_TABLET | Freq: Every day | ORAL | 2 refills | Status: DC

## 2020-07-13 NOTE — Telephone Encounter (Signed)
-----   Message from Viona Gilmore, [facility] sent at 07/11/2020  1:49 PM EDT ----- Regarding: BP medication question Hi,  My CMA called Wendy [name] to check on her blood pressure and she reported that she stopped taking the lisinopril-HCTZ because she heard bad things about it and started taking her sister's amlodipine 10 mg once daily 2 days ago with reported BP readings of 118/77, 131/88, 117/77 from the last 2 days. She is requesting for a prescription for the amlodipine (she has been on it in the past). Do you want me to have her scheduled to see you to make this change? Or are you ok with switching the medications and then having her follow up with you in a couple of weeks to recheck her BP? Haven't had this happen before so I wasn't sure how you wanted to proceed.  Let me know!  Best, Maddie

## 2020-07-13 NOTE — Telephone Encounter (Signed)
That's okay with me. I stopped the Lisinopril HCT and I sent in her own supply of Amlodipine. Have her see me in 3-4 weeks. Thanks

## 2020-07-16 NOTE — Telephone Encounter (Signed)
Patient is aware that the amlodipine prescription was sent in to replace lisinopril-HCTZ.   Scheduled follow up with PCP for BP assessment for June 7th.

## 2020-07-18 ENCOUNTER — Other Ambulatory Visit: Payer: Self-pay | Admitting: Family Medicine

## 2020-07-26 ENCOUNTER — Other Ambulatory Visit: Payer: Self-pay | Admitting: Family Medicine

## 2020-07-31 NOTE — Telephone Encounter (Signed)
Sent message to PCP about making the change in medications. PCP agreeable to make the switch and follow up appt scheduled.  Called the patient to let her know about the change and patient is agreeable to the plan. Patient is aware to continue checking her BP at home and will follow up with PCP to recheck BP in office.

## 2020-08-06 ENCOUNTER — Other Ambulatory Visit: Payer: Self-pay

## 2020-08-07 ENCOUNTER — Ambulatory Visit (INDEPENDENT_AMBULATORY_CARE_PROVIDER_SITE_OTHER): Payer: Medicare HMO | Admitting: Family Medicine

## 2020-08-07 ENCOUNTER — Encounter: Payer: Self-pay | Admitting: Family Medicine

## 2020-08-07 VITALS — BP 146/82 | HR 63 | Temp 97.8°F | Wt 162.0 lb

## 2020-08-07 DIAGNOSIS — I10 Essential (primary) hypertension: Secondary | ICD-10-CM | POA: Diagnosis not present

## 2020-08-07 DIAGNOSIS — E119 Type 2 diabetes mellitus without complications: Secondary | ICD-10-CM

## 2020-08-07 MED ORDER — GLIPIZIDE 5 MG PO TABS: 10.0000 mg | ORAL_TABLET | Freq: Two times a day (BID) | ORAL | 3 refills | Status: DC

## 2020-08-07 NOTE — Progress Notes (Signed)
   Subjective:    Patient ID: Wendy [name], female    DOB: 05-29-1945, 75 y.o.   MRN: [id number]  HPI Here to follow up on diabetes and HTN. She feels well. Her glucoses range from 130 to 200. Her BP is steady in the 140s over 80s.    Review of Systems  Constitutional: Negative.   Respiratory: Negative.   Cardiovascular: Negative.   Neurological: Negative.        Objective:   Physical Exam Constitutional:      Appearance: Normal appearance.  Cardiovascular:     Rate and Rhythm: Normal rate and regular rhythm.     Pulses: Normal pulses.     Heart sounds: Normal heart sounds.  Pulmonary:     Effort: Pulmonary effort is normal.     Breath sounds: Normal breath sounds.  Neurological:     Mental Status: She is alert.           Assessment & Plan:  Her HTN is stable. Her diabetes seems to be only under borderline control. She will return in a month for a complete well exam and we will get fasting labs with an A1c at that time.  [name], MD

## 2020-08-15 ENCOUNTER — Telehealth: Payer: Self-pay | Admitting: Pharmacist

## 2020-08-15 NOTE — Chronic Care Management (AMB) (Signed)
Chronic Care Management Pharmacy Assistant   Name: Wendy [name]  MRN: [id number] DOB: December 06, 1945  Reason for Encounter: Disease State/ Hypertension Assessment Call.    Conditions to be addressed/monitored: HTN  Recent office visits:  08/07/20 [name] MD (PCP) - seen for hypertension and other chronic conditions. Changed glipizide to 4m twice daily from 532mtwice daily. Follow up in 1 month.   Recent consult visits:  None.   Hospital visits:  None in previous 6 months  Medications: Outpatient Encounter Medications as of 08/15/2020  Medication Sig   ACCU-CHEK GUIDE test strip USE TO TEST ONCE DAILY   ALPRAZolam (XANAX) 1 MG tablet Take 1 tablet (1 mg total) by mouth at bedtime as needed for anxiety or sleep.   amLODipine (NORVASC) 10 MG tablet TAKE 1 TABLET BY MOUTH EVERY DAY   atorvastatin (LIPITOR) 10 MG tablet Take 1 tablet (10 mg total) by mouth daily.   b complex vitamins tablet Take 1 tablet by mouth daily.   Blood Glucose Monitoring Suppl (ACCU-CHEK GUIDE) w/Device KIT Use to test sugars daily.   Blood Pressure Monitor KIT Use to check blood pressure.   CALCIUM-VITAMIN D PO Take 1 tablet by mouth daily as needed (supplemental).    Cholecalciferol (VITAMIN D3) 20 MCG (800 UNIT) TABS Take 1 tablet by mouth daily.    Docusate Calcium (STOOL SOFTENER PO) Take by mouth.   fluticasone (FLONASE) 50 MCG/ACT nasal spray SPRAY 2 SPRAYS INTO EACH NOSTRIL EVERY DAY   furosemide (LASIX) 20 MG tablet TAKE 1 TABLET (20 MG TOTAL) BY MOUTH DAILY AS NEEDED (FLUID RETENTION).   gabapentin (NEURONTIN) 100 MG capsule TAKE 1 CAPSULE BY MOUTH TWICE A DAY   Ginkgo Biloba (GNP GINGKO BILOBA EXTRACT PO) Take by mouth.   glipiZIDE (GLUCOTROL) 5 MG tablet Take 2 tablets (10 mg total) by mouth 2 (two) times daily before a meal.   [START ON 08/19/2020] HYDROcodone-acetaminophen (NORCO) 10-325 MG tablet Take 1 tablet by mouth every 6 (six) hours as needed for moderate pain.   hydroxychloroquine  (PLAQUENIL) 200 MG tablet TAKE 1 TABLET BY MOUTH EVERY DAY   Lancets (ACCU-CHEK SOFT TOUCH) lancets Test once per day and diagnosis code is E 11.9 and dispense for Aviva plus   levocetirizine (XYZAL) 5 MG tablet TAKE 1 TABLET BY MOUTH EVERY DAY IN THE EVENING   methocarbamol (ROBAXIN) 500 MG tablet TAKE 1 TABLET (500 MG TOTAL) BY MOUTH AT BEDTIME.   Misc Natural Products (GINSENG COMPLEX PO) Take by mouth. As needed for energy   naproxen sodium (ALEVE) 220 MG tablet Take 220 mg by mouth 2 (two) times daily as needed.   omeprazole (PRILOSEC) 40 MG capsule Take 1 capsule (40 mg total) by mouth daily.   ondansetron (ZOFRAN-ODT) 4 MG disintegrating tablet TAKE 1 TABLET BY MOUTH EVERY 8 HOURS AS NEEDED FOR NAUSEA AND VOMITING   OVER THE COUNTER MEDICATION Active Liver Daily   potassium chloride (KLOR-CON) 10 MEQ tablet Take 1 tablet (10 mEq total) by mouth daily.   No facility-administered encounter medications on file as of 08/15/2020.    Reviewed chart prior to disease state call. Spoke with patient regarding BP  Recent Office Vitals: BP Readings from Last 3 Encounters:  08/07/20 (!) 146/82  12/16/19 (!) 158/76  11/23/19 (!) 184/100   Pulse Readings from Last 3 Encounters:  08/07/20 63  12/16/19 87  11/23/19 74    Wt Readings from Last 3 Encounters:  08/07/20 162 lb (73.5 kg)  06/19/20  163 lb (73.9 kg)  12/16/19 163 lb (73.9 kg)     Kidney Function Lab Results  Component Value Date/Time   CREATININE 1.07 (H) 09/16/2019 03:11 PM   CREATININE 1.10 01/07/2017 04:50 PM   CREATININE 0.96 12/24/2016 02:03 PM   CREATININE 0.88 04/18/2016 03:35 PM   GFR 63.15 01/07/2017 04:50 PM   GFRNONAA 59 (L) 12/24/2016 02:03 PM   GFRAA >60 12/24/2016 02:03 PM    BMP Latest Ref Rng & Units 09/16/2019 01/07/2017 12/24/2016  Glucose 65 - 99 mg/dL 99 114(H) 167(H)  BUN 7 - 25 mg/dL _0 Creatinine 0.60 - 0.93 mg/dL 1.07(H) 1.10 0.96  BUN/Creat Ratio 6 - 22 (calc) 12 - -  Sodium 135 - 146  mmol/L 142 139 138  Potassium 3.5 - 5.3 mmol/L 4.0 4.3 3.1(L)  Chloride 98 - 110 mmol/L 103 103 103  CO2 20 - 32 mmol/L _1 Calcium 8.6 - 10.4 mg/dL 9.5 9.7 9.1    Current antihypertensive regimen:  Amlodipine 32m - take once daily.  How often are you checking your Blood Pressure? daily Current home BP readings: 116/66 - 140/80 range. Patient has not been writing the numbers down.  What recent interventions/DTPs have been made by any provider to improve Blood Pressure control since last CPP Visit: patient was put back on amlodipine and stopped her lisinopril- HCTZ. Any recent hospitalizations or ED visits since last visit with CPP? No What diet changes have been made to improve Blood Pressure Control?  Patient states she usually has eggs scrambled or an eggs sandwich. If she don't have that she drinks an ensure or oatmeal. Patient states she usually has an ensure for lunch due to her stomach. If she don't have na ensure she will have a bologna, lettuce and tomato sandwich. For dinner patient states she usually has mixed vegetables with some butter or will have something like grilled chicken and mashed potatoes. Patient states she drinks about 4 bottles of 16 ounces of water a day and also likes to have beet, apple, tomato or cranberry juice to drink. Patient does not add salt to nay of her food.   What exercise is being done to improve your Blood Pressure Control?  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 Patient states she does everything from housework to helping bathe her clients. Patient states this is what keeps her going and she likes to be active at her age. Patient also watches her grand son at times who keeps her busy.   Adherence Review: Is the patient currently on ACE/ARB medication? No Does the patient have >5 day gap between last estimated fill dates? No  Notes: Spoke with patient and reviewed all  medications as listed. Patient reports taking all medications with no issues at this time. Patient did state that she feels much better and is happy since being switched back to amlodipine for her blood pressure. Patient states she is checking her blood pressure daily but forgets to write the readings down. She gave me some of her recent blood pressures that she could remember. I re encouraged patient to try and start writing her readings with the date of reading down on a log. Patient was agreeable. Patient was babysitting her grandson when I called. Patient did state she ends up having to take her Zofran almost everyday due to feeling nauseous when she wakes up in the mornings  but she has dealt with it for years. Patient thanked me for my call.   Star Rating Drugs:  Glipizide 45m - [facility] Atorvastatin 147m- [facility]/20 [facility] ([phone]

## 2020-08-28 ENCOUNTER — Other Ambulatory Visit: Payer: Self-pay | Admitting: Family Medicine

## 2020-08-29 NOTE — Telephone Encounter (Signed)
Pt is calling in needing a refill on her Rx ondansetron (ZOFRAN-ODT)  4 MG pt is out of medication. [facility] [address].

## 2020-09-12 ENCOUNTER — Telehealth: Payer: Self-pay | Admitting: Pharmacist

## 2020-09-12 NOTE — Chronic Care Management (AMB) (Signed)
I left a patient a message to reschedule her upcoming appointment that was on July 14th. The patient did call the office back and reschedule her appointment for August 12th at [place] ([place] Mare Ferrari, [place] Pharmacist Assistant [phone]

## 2020-09-13 ENCOUNTER — Telehealth: Payer: Medicare HMO

## 2020-09-17 ENCOUNTER — Other Ambulatory Visit: Payer: Self-pay

## 2020-09-18 ENCOUNTER — Encounter: Payer: Self-pay | Admitting: Family Medicine

## 2020-09-18 ENCOUNTER — Ambulatory Visit (INDEPENDENT_AMBULATORY_CARE_PROVIDER_SITE_OTHER): Payer: Medicare HMO | Admitting: Family Medicine

## 2020-09-18 VITALS — BP 130/90 | HR 76 | Temp 98.4°F | Ht 64.0 in | Wt 163.6 lb

## 2020-09-18 DIAGNOSIS — Z0000 Encounter for general adult medical examination without abnormal findings: Secondary | ICD-10-CM

## 2020-09-18 DIAGNOSIS — F1190 Opioid use, unspecified, uncomplicated: Secondary | ICD-10-CM

## 2020-09-18 LAB — CBC WITH DIFFERENTIAL/PLATELET
Basophils Absolute: 0 10*3/uL (ref 0.0–0.1)
Basophils Relative: 0.5 % (ref 0.0–3.0)
Eosinophils Absolute: 0 10*3/uL (ref 0.0–0.7)
Eosinophils Relative: 0.9 % (ref 0.0–5.0)
HCT: 38.4 % (ref 36.0–46.0)
Hemoglobin: 12.7 g/dL (ref 12.0–15.0)
Lymphocytes Relative: 38.1 % (ref 12.0–46.0)
Lymphs Abs: 2.1 10*3/uL (ref 0.7–4.0)
MCHC: 33.1 g/dL (ref 30.0–36.0)
MCV: 79.1 fl (ref 78.0–100.0)
Monocytes Absolute: 0.7 10*3/uL (ref 0.1–1.0)
Monocytes Relative: 12.9 % — ABNORMAL HIGH (ref 3.0–12.0)
Neutro Abs: 2.6 10*3/uL (ref 1.4–7.7)
Neutrophils Relative %: 47.6 % (ref 43.0–77.0)
Platelets: 197 10*3/uL (ref 150.0–400.0)
RBC: 4.86 Mil/uL (ref 3.87–5.11)
RDW: 16.5 % — ABNORMAL HIGH (ref 11.5–15.5)
WBC: 5.5 10*3/uL (ref 4.0–10.5)

## 2020-09-18 LAB — HEPATIC FUNCTION PANEL
ALT: 14 U/L (ref 0–35)
AST: 15 U/L (ref 0–37)
Albumin: 3.9 g/dL (ref 3.5–5.2)
Alkaline Phosphatase: 66 U/L (ref 39–117)
Bilirubin, Direct: 0.1 mg/dL (ref 0.0–0.3)
Total Bilirubin: 0.6 mg/dL (ref 0.2–1.2)
Total Protein: 6.9 g/dL (ref 6.0–8.3)

## 2020-09-18 LAB — BASIC METABOLIC PANEL
BUN: 12 mg/dL (ref 6–23)
CO2: 28 mEq/L (ref 19–32)
Calcium: 9.3 mg/dL (ref 8.4–10.5)
Chloride: 105 mEq/L (ref 96–112)
Creatinine, Ser: 0.71 mg/dL (ref 0.40–1.20)
GFR: 84.19 mL/min (ref >60.00)
Glucose, Bld: 138 mg/dL — ABNORMAL HIGH (ref 70–99)
Potassium: 3.9 mEq/L (ref 3.5–5.1)
Sodium: 140 mEq/L (ref 135–145)

## 2020-09-18 LAB — LIPID PANEL
Cholesterol: 142 mg/dL (ref 0–200)
HDL: 57.2 mg/dL (ref >39.00)
LDL Cholesterol: 72 mg/dL (ref 0–99)
NonHDL: 84.94
Total CHOL/HDL Ratio: 2
Triglycerides: 64 mg/dL (ref 0.0–149.0)
VLDL: 12.8 mg/dL (ref 0.0–40.0)

## 2020-09-18 LAB — T3, FREE: T3, Free: 3.4 pg/mL (ref 2.3–4.2)

## 2020-09-18 LAB — HEMOGLOBIN A1C: Hgb A1c MFr Bld: 8.8 % — ABNORMAL HIGH (ref 4.6–6.5)

## 2020-09-18 LAB — T4, FREE: Free T4: 0.74 ng/dL (ref 0.60–1.60)

## 2020-09-18 LAB — TSH: TSH: 1.3 u[IU]/mL (ref 0.35–5.50)

## 2020-09-18 MED ORDER — PREGABALIN 200 MG PO CAPS: 200.0000 mg | ORAL_CAPSULE | Freq: Every day | ORAL | 1 refills | Status: DC

## 2020-09-18 MED ORDER — ALPRAZOLAM 1 MG PO TABS: 1.0000 mg | ORAL_TABLET | Freq: Every evening | ORAL | 1 refills | Status: DC | PRN

## 2020-09-18 NOTE — Progress Notes (Signed)
   Subjective:    Patient ID: Wendy [name], female    DOB: 03-19-1945, 75 y.o.   MRN: [id number]  HPI Here for a well exam. Her only complaint is numbness and burning in her feet. We diagnosed her with diabetic neuropathy a year ago, and she has been taking Gabapentin. She has not had good results with this however and she wants to try something else.    Review of Systems  Constitutional: Negative.   HENT: Negative.    Eyes: Negative.   Respiratory: Negative.    Cardiovascular: Negative.   Gastrointestinal: Negative.   Genitourinary:  Negative for decreased urine volume, difficulty urinating, dyspareunia, dysuria, enuresis, flank pain, frequency, hematuria, pelvic pain and urgency.  Musculoskeletal: Negative.   Skin: Negative.   Neurological:  Positive for numbness. Negative for headaches.  Psychiatric/Behavioral: Negative.        Objective:   Physical Exam Constitutional:      General: She is not in acute distress.    Appearance: Normal appearance. She is well-developed.  HENT:     Head: Normocephalic and atraumatic.     Right Ear: External ear normal.     Left Ear: External ear normal.     Nose: Nose normal.     Mouth/Throat:     Pharynx: No oropharyngeal exudate.  Eyes:     General: No scleral icterus.    Conjunctiva/sclera: Conjunctivae normal.     Pupils: Pupils are equal, round, and reactive to light.  Neck:     Thyroid: No thyromegaly.     Vascular: No JVD.  Cardiovascular:     Rate and Rhythm: Normal rate and regular rhythm.     Heart sounds: Normal heart sounds. No murmur heard.   No friction rub. No gallop.  Pulmonary:     Effort: Pulmonary effort is normal. No respiratory distress.     Breath sounds: Normal breath sounds. No wheezing or rales.  Chest:     Chest wall: No tenderness.  Abdominal:     General: Bowel sounds are normal. There is no distension.     Palpations: Abdomen is soft. There is no mass.     Tenderness: There is no abdominal  tenderness. There is no guarding or rebound.  Musculoskeletal:        General: No tenderness. Normal range of motion.     Cervical back: Normal range of motion and neck supple.  Lymphadenopathy:     Cervical: No cervical adenopathy.  Skin:    General: Skin is warm and dry.     Findings: No erythema or rash.  Neurological:     Mental Status: She is alert and oriented to person, place, and time.     Cranial Nerves: No cranial nerve deficit.     Motor: No abnormal muscle tone.     Coordination: Coordination normal.     Deep Tendon Reflexes: Reflexes are normal and symmetric. Reflexes normal.  Psychiatric:        Behavior: Behavior normal.        Thought Content: Thought content normal.        Judgment: Judgment normal.          Assessment & Plan:  Well exam. We discussed diet and exercise. Get fasting labs. For the neuropathy, we will stop Gabapentin and try Lyrica 200 mg at bedtime.  [name], MD

## 2020-09-18 NOTE — Addendum Note (Signed)
Addended by: Amanda Cockayne on: 09/18/2020 01:49 PM   Modules accepted: Orders

## 2020-09-19 ENCOUNTER — Other Ambulatory Visit: Payer: Self-pay

## 2020-09-19 DIAGNOSIS — E119 Type 2 diabetes mellitus without complications: Secondary | ICD-10-CM

## 2020-09-19 MED ORDER — SITAGLIPTIN PHOSPHATE 100 MG PO TABS: 100.0000 mg | ORAL_TABLET | Freq: Every day | ORAL | 3 refills | Status: DC

## 2020-09-21 ENCOUNTER — Ambulatory Visit (INDEPENDENT_AMBULATORY_CARE_PROVIDER_SITE_OTHER): Payer: Medicare HMO | Admitting: Family Medicine

## 2020-09-21 ENCOUNTER — Encounter: Payer: Self-pay | Admitting: Family Medicine

## 2020-09-21 ENCOUNTER — Other Ambulatory Visit: Payer: Self-pay

## 2020-09-21 VITALS — BP 140/70 | HR 91 | Temp 98.5°F | Wt 165.6 lb

## 2020-09-21 DIAGNOSIS — M5440 Lumbago with sciatica, unspecified side: Secondary | ICD-10-CM | POA: Diagnosis not present

## 2020-09-21 DIAGNOSIS — F1190 Opioid use, unspecified, uncomplicated: Secondary | ICD-10-CM

## 2020-09-21 DIAGNOSIS — G8929 Other chronic pain: Secondary | ICD-10-CM | POA: Diagnosis not present

## 2020-09-21 LAB — DRUG MONITOR, PANEL 1, W/CONF, URINE
Amphetamines: NEGATIVE ng/mL (ref <500)
Barbiturates: NEGATIVE ng/mL (ref <300)
Benzodiazepines: NEGATIVE ng/mL (ref <100)
Cocaine Metabolite: NEGATIVE ng/mL (ref <150)
Codeine: NEGATIVE ng/mL (ref <50)
Creatinine: 58.9 mg/dL (ref >=20.0)
Hydrocodone: 771 ng/mL — ABNORMAL HIGH (ref <50)
Hydromorphone: 270 ng/mL — ABNORMAL HIGH (ref <50)
Marijuana Metabolite: 84 ng/mL — ABNORMAL HIGH (ref <5)
Marijuana Metabolite: POSITIVE ng/mL — AB (ref <20)
Methadone Metabolite: NEGATIVE ng/mL (ref <100)
Morphine: NEGATIVE ng/mL (ref <50)
Norhydrocodone: 614 ng/mL — ABNORMAL HIGH (ref <50)
Opiates: POSITIVE ng/mL — AB (ref <100)
Oxidant: NEGATIVE ug/mL (ref <200)
Oxycodone: NEGATIVE ng/mL (ref <100)
Phencyclidine: NEGATIVE ng/mL (ref <25)
pH: 5.3 (ref 4.5–9.0)

## 2020-09-21 LAB — DM TEMPLATE

## 2020-09-21 MED ORDER — HYDROCODONE-ACETAMINOPHEN 10-325 MG PO TABS: 1.0000 | ORAL_TABLET | Freq: Four times a day (QID) | ORAL | 0 refills | Status: DC | PRN

## 2020-09-21 NOTE — Progress Notes (Signed)
   Subjective:    Patient ID: Wendy [name], female    DOB: 1946-01-21, 75 y.o.   MRN: [id number]  HPI Here for pain management. She is doing well. We recently switched from Gabapentin to Lyrica, and this in combination with the Norco has been working well.    Review of Systems  Constitutional: Negative.   Respiratory: Negative.    Cardiovascular: Negative.   Musculoskeletal:  Positive for back pain.      Objective:   Physical Exam Constitutional:      Appearance: Normal appearance.  Neurological:     Mental Status: She is alert.          Assessment & Plan:  Pain management. Indication for chronic opioid: low back pain Medication and dose: Norco 10-325 # pills per month: 120 Last UDS date: 09-18-20 Opioid Treatment Agreement signed (Y/N): 06-19-17 Opioid Treatment Agreement last reviewed with patient:  09-21-20 [place] reviewed this encounter (include red flags): Yes Meds were refilled.  [name], MD

## 2020-09-25 ENCOUNTER — Encounter: Payer: Self-pay | Admitting: Family Medicine

## 2020-09-25 ENCOUNTER — Other Ambulatory Visit: Payer: Self-pay

## 2020-09-25 ENCOUNTER — Ambulatory Visit (INDEPENDENT_AMBULATORY_CARE_PROVIDER_SITE_OTHER): Payer: Medicare HMO | Admitting: Family Medicine

## 2020-09-25 VITALS — BP 140/82 | HR 98 | Temp 99.2°F | Ht 64.0 in | Wt 160.6 lb

## 2020-09-25 DIAGNOSIS — M542 Cervicalgia: Secondary | ICD-10-CM | POA: Diagnosis not present

## 2020-09-25 DIAGNOSIS — M792 Neuralgia and neuritis, unspecified: Secondary | ICD-10-CM

## 2020-09-25 MED ORDER — METHOCARBAMOL 500 MG PO TABS: 500.0000 mg | ORAL_TABLET | Freq: Four times a day (QID) | ORAL | 3 refills | Status: DC | PRN

## 2020-09-25 MED ORDER — PREGABALIN 100 MG PO CAPS: 100.0000 mg | ORAL_CAPSULE | Freq: Every day | ORAL | 5 refills | Status: DC

## 2020-09-25 MED ORDER — METHYLPREDNISOLONE ACETATE 40 MG/ML IJ SUSP
40.0000 mg | Freq: Once | INTRAMUSCULAR | Status: AC
  Administered 2020-09-25: 40 mg via INTRAMUSCULAR

## 2020-09-25 MED ORDER — METHYLPREDNISOLONE 4 MG PO TBPK: ORAL_TABLET | ORAL | 0 refills | Status: DC

## 2020-09-25 MED ORDER — METHYLPREDNISOLONE ACETATE 80 MG/ML IJ SUSP
80.0000 mg | Freq: Once | INTRAMUSCULAR | Status: AC
  Administered 2020-09-25: 80 mg via INTRAMUSCULAR

## 2020-09-25 NOTE — Progress Notes (Signed)
   Subjective:    Patient ID: Wendy [name], female    DOB: 19-Oct-1945, 75 y.o.   MRN: [id number]  HPI Here for 2 days of sharp pains in the left neck area that shoot all the way down the right arm to the hand. No recent trauma. Her neck is very stiff and she tries to keep from moving it. She is applying heat . No numbness or weakness in the arm.    Review of Systems  Constitutional: Negative.   Respiratory: Negative.    Cardiovascular: Negative.   Musculoskeletal:  Positive for neck pain and neck stiffness.      Objective:   Physical Exam Constitutional:      Comments: In pain   Cardiovascular:     Rate and Rhythm: Normal rate and regular rhythm.     Pulses: Normal pulses.     Heart sounds: Normal heart sounds.  Pulmonary:     Effort: Pulmonary effort is normal.     Breath sounds: Normal breath sounds.  Musculoskeletal:     Comments: She is very tender in the left posterior neck, especially over the C5-C6 level. The right trapezius is tender and very tight. ROM of her neck is quite limited due to the pain  Neurological:     Mental Status: She is alert.          Assessment & Plan:  Neck pain with radicular pain. She will continue the heat. Add Methocarbamol every 6 hours as needed. She is given a shot of DepoMedrol and she will follow this with a Medrol dose pack. She is written out of work from 09-24-20 through 09-30-20.  [name], MD

## 2020-09-25 NOTE — Addendum Note (Signed)
Addended by: Geradine Girt D on: 09/25/2020 03:03 PM   Modules accepted: Orders

## 2020-09-26 ENCOUNTER — Telehealth: Payer: Self-pay | Admitting: Family Medicine

## 2020-09-26 NOTE — Telephone Encounter (Signed)
Left message for patient to call back and schedule Medicare Annual Wellness Visit (AWV) either virtually or in office.   Last AWV 10/11/19 please schedule at anytime with [facility] Nurse Health Advisor 1 or 2  Can be calendar year [place] insurance   This should be a 45 minute visit.

## 2020-10-11 ENCOUNTER — Telehealth: Payer: Self-pay | Admitting: Pharmacist

## 2020-10-11 NOTE — Chronic Care Management (AMB) (Signed)
    Chronic Care Management Pharmacy Assistant   Name: EMBREY LEGGITT  MRN: [id number] DOB: Jul 10, 1945  10/11/20-  Called patient to remind of appointment with [name]) on (10/12/20 at 11:30am via telephone.)   No answer, left message of appointment date, time and type of appointment (either telephone or in person). Left message to have all medications, supplements, blood pressure and/or blood sugar logs available during appointment and to return call if need to reschedule.   Care Gaps:  AWV - completed 8/21 -  message sent to Ramond Craver to reach out and schedule.  Tetanus/TDAP - never done Urine microalbumin - overdue since 10/20/14 Foot exam - overdue since 10/27/14 Influenza vaccine -  overdue since 10/01/20   Star Rating Drug:  Atorvastatin '10mg'$  - [facility]  Glipizide '5mg'$  - last filled [facility] 09/02/20 [facility] Sitagliptin '100mg'$  - not listed in fill history   Any gaps in medications fill history? No.  [place]  Clinical Pharmacist Assistant [phone]

## 2020-10-12 ENCOUNTER — Ambulatory Visit (INDEPENDENT_AMBULATORY_CARE_PROVIDER_SITE_OTHER): Payer: Medicare HMO | Admitting: Pharmacist

## 2020-10-12 DIAGNOSIS — I10 Essential (primary) hypertension: Secondary | ICD-10-CM

## 2020-10-12 DIAGNOSIS — E119 Type 2 diabetes mellitus without complications: Secondary | ICD-10-CM | POA: Diagnosis not present

## 2020-10-12 NOTE — Progress Notes (Signed)
Chronic Care Management Pharmacy Note  10/27/2020 Name:  Wendy [name] MRN:  [id number] DOB:  08-Nov-1945  Summary: A1c increased and not at goal < 7% Pts blood sugars have been very elevated post steroid injection  Recommendations/Changes made from today's visit: -Recommended continued daily monitoring of blood sugars -Recommended increasing intake of vegetables and aiming for healthy plate model -Recommended checking blood pressure regularly at home  Plan: Follow up DM assessment in 3-4 weeks  Subjective: Wendy [name] is an 75 y.o. year old female who is a primary patient of Laurey Morale, MD.  The CCM team was consulted for assistance with disease management and care coordination needs.    Engaged with patient by telephone for follow up visit in response to provider referral for pharmacy case management and/or care coordination services.   Consent to Services:  The patient was given information about Chronic Care Management services, agreed to services, and gave verbal consent prior to initiation of services.  Please see initial visit note for detailed documentation.   Patient Care Team: Laurey Morale, MD as PCP - General Viona Gilmore, [facility] as Pharmacist (Pharmacist)  Recent office visits: 09/25/20 [name], MD: Patient presented for sharp pain in her left neck area. Prescribed methocarbamol every 6 hours as needed and administered Depomedrol.  09/21/20 [name], MD: Patient presented for video visit for pain management. Refilled hydrocodone-APAP 10-3257m.   09/18/20 [name]: Patient presented for annual exam. D/C'd gabapentin and switched to Lyrica 200 mg at bedtime. A1c increased to 8.8% and prescribed Januvia 100 mg.  08/07/20 SAlysia PennaMD (PCP) - seen for hypertension and other chronic conditions. Changed glipizide to 157mtwice daily from 57m66mwice daily. Follow up in 1 month.   06/19/20 SteAlysia PennaD: Patient presented for video visit for pain  management. Refilled hydrocodone-APAP 10-3257m2m 03/21/20 StepAlysia Penna: Patient presented for video visit for pain management. Refilled hydrocodone-APAP 10-3257mg44mRecent consult visits: 03/02/20 Patient completed mammogram.  Hospital visits: None in previous 6 months  Objective:  Lab Results  Component Value Date   CREATININE 0.71 09/18/2020   BUN 12 09/18/2020   GFR 84.19 09/18/2020   GFRNONAA 59 (L) 12/24/2016   GFRAA >60 12/24/2016   NA 140 09/18/2020   K 3.9 09/18/2020   CALCIUM 9.3 09/18/2020   CO2 28 09/18/2020   GLUCOSE 138 (H) 09/18/2020    Lab Results  Component Value Date/Time   HGBA1C 8.8 (H) 09/18/2020 01:42 PM   HGBA1C 6.8 (H) 09/16/2019 03:11 PM   GFR 84.19 09/18/2020 01:42 PM   GFR 63.15 01/07/2017 04:50 PM   MICROALBUR 0.3 10/19/2013 09:44 AM   MICROALBUR 1.2 03/30/2009 03:00 PM    Last diabetic Eye exam:  Lab Results  Component Value Date/Time   HMDIABEYEEXA No Retinopathy 05/22/2015 10:11 AM    Last diabetic Foot exam: No results found for: HMDIABFOOTEX   Lab Results  Component Value Date   CHOL 142 09/18/2020   HDL 57.20 09/18/2020   LDLCALC 72 09/18/2020   LDLDIRECT 143.6 06/05/2010   TRIG 64.0 09/18/2020   CHOLHDL 2 09/18/2020    Hepatic Function Latest Ref Rng & Units 09/18/2020 09/16/2019 12/24/2016  Total Protein 6.0 - 8.3 g/dL 6.9 6.7 7.9  Albumin 3.5 - 5.2 g/dL 3.9 - 3.7  AST 0 - 37 U/L _0 ALT 0 - 35 U/L 14 15 13(L)  Alk Phosphatase 39 - 117 U/L 66 - 69  Total Bilirubin 0.2 - 1.2 mg/dL 0.6 0.8 1.2  Bilirubin, Direct 0.0 - 0.3 mg/dL 0.1 0.2 -    Lab Results  Component Value Date/Time   TSH 1.30 09/18/2020 01:42 PM   TSH 0.80 09/16/2019 03:11 PM   FREET4 0.74 09/18/2020 01:42 PM   FREET4 0.74 08/25/2011 12:17 PM    CBC Latest Ref Rng & Units 09/18/2020 09/16/2019 12/24/2016  WBC 4.0 - 10.5 K/uL 5.5 5.3 10.3  Hemoglobin 12.0 - 15.0 g/dL 12.7 13.2 13.9  Hematocrit 36.0 - 46.0 % 38.4 40.8 42.1  Platelets 150.0 -  400.0 K/uL 197.0 205 197    No results found for: VD25OH  Clinical ASCVD: No  The ASCVD Risk score Mikey Bussing DC Jr., et al., 2013) failed to calculate for the following reasons:   Unable to determine if patient is Non-Hispanic African American    Depression screen [facility] 2/9 10/11/2019 12/21/2017 04/18/2016  Decreased Interest 0 1 0  Down, Depressed, Hopeless 0 0 0  PHQ - 2 Score 0 1 0  Altered sleeping 0 - -  Tired, decreased energy 0 - -  Change in appetite 0 - -  Feeling bad or failure about yourself  0 - -  Trouble concentrating 0 - -  Moving slowly or fidgety/restless 0 - -  Suicidal thoughts 0 - -  PHQ-9 Score 0 - -  Difficult doing work/chores Not difficult at all - -      Social History   Tobacco Use  Smoking Status Some Days   Packs/day: 0.03   Years: 45.00   Pack years: 1.35   Types: Cigars, Cigarettes  Smokeless Tobacco Never  Tobacco Comments   1 black and mild every 2-3 days- no cigs - doesnt smoke the whole thing    BP Readings from Last 3 Encounters:  09/25/20 140/82  09/21/20 140/70  09/18/20 130/90   Pulse Readings from Last 3 Encounters:  09/25/20 98  09/21/20 91  09/18/20 76   Wt Readings from Last 3 Encounters:  09/25/20 160 lb 9.6 oz (72.8 kg)  09/21/20 165 lb 9.6 oz (75.1 kg)  09/18/20 163 lb 9.6 oz (74.2 kg)   BMI Readings from Last 3 Encounters:  09/25/20 27.57 kg/m  09/21/20 28.43 kg/m  09/18/20 28.08 kg/m    Assessment/Interventions: Review of patient past medical history, allergies, medications, health status, including review of consultants reports, laboratory and other test data, was performed as part of comprehensive evaluation and provision of chronic care management services.   SDOH:  (Social Determinants of Health) assessments and interventions performed: No   CCM Care Plan  Allergies  Allergen Reactions   Aspirin Nausea And Vomiting   Tramadol Nausea And Vomiting    Medications Reviewed Today     Reviewed by Laurey Morale, MD (Physician) on 09/25/20 at 1340  Med List Status: <None>   Medication Order Taking? Sig Documenting Provider Last Dose Status Informant  ACCU-CHEK GUIDE test strip [id number] Yes USE TO TEST ONCE DAILY Laurey Morale, MD Taking Active   ALPRAZolam Duanne Moron) 1 MG tablet [id number] Yes Take 1 tablet (1 mg total) by mouth at bedtime as needed for anxiety or sleep. Laurey Morale, MD Taking Active   amLODipine (NORVASC) 10 MG tablet [id number] Yes TAKE 1 TABLET BY MOUTH EVERY DAY Laurey Morale, MD Taking Active   atorvastatin (LIPITOR) 10 MG tablet [id number] Yes Take 1 tablet (10 mg total) by mouth daily. Laurey Morale, MD Taking Active   b complex  vitamins tablet [id number] Yes Take 1 tablet by mouth daily. [provider] Taking Active Self  Blood Glucose Monitoring Suppl (ACCU-CHEK GUIDE) w/Device KIT [id number] Yes Use to test sugars daily. Laurey Morale, MD Taking Active   Blood Pressure Monitor KIT [id number] Yes Use to check blood pressure. Laurey Morale, MD Taking Active   CALCIUM-VITAMIN D PO [id number] Yes Take 1 tablet by mouth daily as needed (supplemental).  [provider] Taking Active Self  Cholecalciferol (VITAMIN D3) 20 MCG (800 UNIT) TABS [id number] Yes Take 1 tablet by mouth daily.  [provider] Taking Active Self  fluticasone (FLONASE) 50 MCG/ACT nasal spray [id number] Yes SPRAY 2 SPRAYS INTO EACH NOSTRIL EVERY DAY Laurey Morale, MD Taking Active   furosemide (LASIX) 20 MG tablet [id number] Yes TAKE 1 TABLET (20 MG TOTAL) BY MOUTH DAILY AS NEEDED (FLUID RETENTION). Laurey Morale, MD Taking Active   Ginkgo Biloba (GNP GINGKO BILOBA EXTRACT PO) [id number] Yes Take by mouth. [provider] Taking Active   glipiZIDE (GLUCOTROL) 5 MG tablet [id number] Yes Take 2 tablets (10 mg total) by mouth 2 (two) times daily before a meal. Laurey Morale, MD Taking Active   HYDROcodone-acetaminophen [facility]) 10-325 MG tablet [id number] Yes Take 1 tablet by  mouth every 6 (six) hours as needed for moderate pain. Laurey Morale, MD Taking Active   hydroxychloroquine (PLAQUENIL) 200 MG tablet [id number] Yes TAKE 1 TABLET BY MOUTH EVERY DAY Laurey Morale, MD Taking Active   Lancets (ACCU-CHEK SOFT [facility]) lancets [id number] Yes Test once per day and diagnosis code is E 11.9 and dispense for Aviva plus Laurey Morale, MD Taking Active   levocetirizine (XYZAL) 5 MG tablet [id number] Yes TAKE 1 TABLET BY MOUTH EVERY DAY IN THE Billee Cashing, MD Taking Active   methocarbamol (ROBAXIN) 500 MG tablet [id number] Yes TAKE 1 TABLET (500 MG TOTAL) BY MOUTH AT BEDTIME. Laurey Morale, MD Taking Active   Misc Natural Products [facility] COMPLEX PO) [id number] Yes Take by mouth. As needed for energy [provider] Taking Active   naproxen sodium (ALEVE) 220 MG tablet [id number] Yes Take 220 mg by mouth 2 (two) times daily as needed. [provider] Taking Active   Omega-3 Fatty Acids (OMEGA 3 PO) [id number] Yes Take by mouth daily. [provider] Taking Active   omeprazole (PRILOSEC) 40 MG capsule [id number] Yes Take 1 capsule (40 mg total) by mouth daily. Laurey Morale, MD Taking Active   ondansetron (ZOFRAN-ODT) 4 MG disintegrating tablet [id number] Yes TAKE 1 TABLET BY MOUTH EVERY 8 HOURS AS NEEDED FOR NAUSEA AND VOMITING Laurey Morale, MD Taking Active   OVER THE COUNTER MEDICATION [id number] Yes Active Liver Daily [provider] Taking Active   Polyethylene Glycol 3350 (MIRALAX PO) [id number] Yes Take by mouth as needed. [provider] Taking Active   potassium chloride (KLOR-CON) 10 MEQ tablet [id number] Yes Take 1 tablet (10 mEq total) by mouth daily. Laurey Morale, MD Taking Active   pregabalin (LYRICA) 200 MG capsule [id number] No Take 1 capsule (200 mg total) by mouth at bedtime.  Patient not taking: Reported on 09/25/2020   Laurey Morale, MD Not Taking Active   sitaGLIPtin (JANUVIA) 100 MG tablet [id number] No Take  1 tablet (100 mg total) by mouth daily.  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 She got off at 10:15 this morning. [place].  Conditions to be addressed/monitored:  Hypertension, Diabetes, Anxiety, Osteoporosis, Tobacco use, Allergic Rhinitis and pain, neuropathy, edema  Conditions addressed this visit: Hypertension, diabetes  Care Plan : CCM Pharmacy Care Plan  Updates made by Viona Gilmore, [place] since 10/27/2020 12:00 AM     Problem: Problem: Hypertension, Diabetes, Anxiety, Osteoporosis, Tobacco use, Allergic Rhinitis and pain, neuropathy, edema      Long-Range Goal: Patient-Specific Goal   Start Date: 05/17/2020  Expected End Date: 05/17/2021  Recent Progress: On track  Priority: High  Note:   Current Barriers:  Unable to independently monitor therapeutic efficacy Unable to achieve control of cholesterol and blood pressure  Does not adhere  to prescribed medication regimen  Pharmacist Clinical Goal(s):  Patient will achieve adherence to monitoring guidelines and medication adherence to achieve therapeutic efficacy achieve control of blood pressure as evidenced by blood pressure readings  through collaboration with PharmD and provider.   Interventions: 1:1 collaboration with Laurey Morale, MD regarding development and update of comprehensive plan of care as evidenced by provider attestation and co-signature Inter-disciplinary care team collaboration (see longitudinal plan of care) Comprehensive medication review performed; medication list updated in electronic medical record  Hypertension (BP goal <140/90) -Uncontrolled -Current treatment: Lisinopril-HCTZ 20-567m, 1 tablet once daily  Furosemide 20 mg 1 tablet daily -Medications previously tried: none  -Current home readings: could not provide readings -Current dietary habits: did not discuss -Current exercise habits: walks at work and doing arm rotations and leg rotations; using an exercise book at home from PT -Denies hypotensive/hypertensive symptoms -Educated on Exercise goal of 150 minutes per week; Importance of home blood pressure monitoring; Proper BP monitoring technique; -Counseled to monitor BP at home at least weekly, document, and provide log at future appointments -Counseled on diet and exercise extensively Recommended to continue current medication  Hyperlipidemia: (LDL goal < 70) -Not ideally controlled -Current treatment: Atorvastatin 1167m 1 tablet once daily -Medications previously tried: none  -Current dietary patterns: did not discuss -Current exercise habits: walks at work and doing arm rotations and leg rotations; using an exercise book at home from PT -Educated on Cholesterol goals;  Benefits of statin for ASCVD risk reduction; Importance of limiting foods high in cholesterol; Exercise goal of 150 minutes per week; -Counseled on diet and  exercise extensively Recommended to continue current medication   Diabetes (A1c goal <7%) -Controlled -Current medications: Glipizide 67m3m1 tablet twice daily before a meal Januvia -Medications previously tried: metformin (stomach upset), Januvia (low BGs)  -Current home glucose readings fasting glucose: 10/12/20 298, 372 (7am), 295 (this morning),  10/11/20 377, 356, 370, 314, 254, 10/08/20 370, 8/7 276 (am), 8/6 294 (am), 8/5 231, 8/4 304, 334, 313 Prior to steroid injection: 7/19 126 (250 pm), 7/23 143 (am), 7/25 186 (am), 6/1 112 post prandial glucose: n/a -Denies hypoglycemic/hyperglycemic symptoms -Current meal patterns:  breakfast: n/a  lunch: PB crackers and cheese and bologna; sandwich or crackers  dinner: brussel sprouts, broccoli, green giant mixed veggies snacks: patient was eating more candy; eating more vegetables and fruit drinks: water; cranberry juice occassionally  -Current exercise: walks at work and doing arm rotations and leg rotations; using an exercise book at home from PT -Educated on A1c and blood sugar goals; Exercise goal of 150 minutes per week; Carbohydrate counting and/or plate method -Counseled to check feet daily and get yearly eye exams -Counseled on diet and exercise extensively Recommended to continue current medication Recommended adding Januvia to pillbox with other medications.  Edema (Goal: minimize swelling) -Controlled -Current treatment  Furosemide 40m, 1 tablet once daily as needed for fluid retention Potassium chloride 173m, 1 tablet once daily  -Medications previously tried: none  -Recommended to continue current medication  Rheumatoid arthritis (Goal: minimize symptoms and manage pain) -Controlled -Current treatment  Hydroxychloroquine 20061m1 tablet once daily  -Medications previously tried: none  -Recommended to continue current medication  Neuropathy (Goal: minimize pain) -Controlled -Current treatment  Gabapentin 100m42m  capsule twice daily -Medications previously tried: none  -Recommended to continue current medication  Pain (Goal: minimize pain) -Controlled -Current treatment  Hydrocodone/ APAP 10/325mg72mtablet every six hours as needed for moderate pain (takes 3-4x per day)  Methocarbamol 500 mg 1 tablet at bedtime -Medications previously tried: n/a  -Recommended to continue current medication Counseled on not taking hydrocodone at the same time as alprazolam due to sedation Patient backed down on use of methocarbamol due to sluggishness.  Anxiety (Goal: minimize symptoms) -Controlled -Current treatment: Alprazolam (Xanax) 1mg, 54mablet at bedtime as needed for anxiety or sleep  -Medications previously tried/failed: n/a -GAD7: n/a -Educated on Benefits of medication for symptom control Benefits of cognitive-behavioral therapy with or without medication -Recommended to continue current medication  Tobacco use (Goal quit smoking) -Controlled -Previous quit attempts: cold turkey[place]ent treatment  No medications -Patient smokes After 30 minutes of waking -Patient triggers include:  habit -On a scale of 1-10, reports MOTIVATION to quit is 10 -On a scale of 1-10, reports CONFIDENCE in quitting is 10 - Patient quit smoking a month ago and congratulated her on this accomplishment -Recommended to continue without smoking  Allergic rhinitis (Goal: minimize symptoms of allergies) -Controlled -Current treatment  levocetirizine 5mg, 159mblet every evening  Fluticasone 50mcg/a1masal spray, 2 sprays into each nostril every day  -Medications previously tried: none  -Recommended to continue current medication Counseled on avoiding allergy triggers  GERD (Goal: minimize symptoms) -Controlled -Current treatment  Omeprazole 40 mg 1 capsule daily -Medications previously tried: none  -Recommended to continue current medication   Health Maintenance -Vaccine gaps: tetanus, shingles -Current  therapy:  Vitamin B complex 1 tablet daily Ginkgo biloba 1 tablet daily Ginseng complex daily Active liver daily Vitamin D3 1000 units, 1 tablet once daily Calcium-mag-zinc daily  -Educated on Herbal supplement research is limited and benefits usually cannot be proven -Patient is satisfied with current therapy and denies issues -Educated on risk vs benefit of supplementation  Patient Goals/Self-Care Activities Patient will:  - take medications as prescribed check glucose daily, document, and provide at future appointments check blood pressure weekly, document,  and provide at future appointments  Follow Up Plan: Telephone follow up appointment with care management team member scheduled for: 3 months         Medication Assistance: None required.  Patient affirms current coverage meets needs.  Compliance/Adherence/Medication fill history: Care Gaps: Tetanus, influenza, urine microalbumin, foot exam  Star-Rating Drugs: Atorvastatin 54m - [facility]  Glipizide 566m- last [facility] Sitagliptin 10035m not listed in fill history  [facility]  [id number]: [phone]: [phone] pill box? No - reports medications in separate bags. Has own management system.Reports never forgetting to take it.   Pt endorses 100% compliance  We discussed: Current pharmacy is preferred with insurance plan and patient is satisfied with pharmacy services Patient decided to: Continue current medication management strategy  Care Plan and Follow Up Patient Decision:  Patient agrees to Care Plan and Follow-up.   Plan: Telephone follow up appointment with care management team member scheduled for:  3 months  MadeJeni SallesarmD BCACMinnehaharmacist LeBaHopeBrasPablo-[phone]

## 2020-10-23 ENCOUNTER — Other Ambulatory Visit: Payer: Self-pay | Admitting: Family Medicine

## 2020-10-24 ENCOUNTER — Telehealth: Payer: Self-pay

## 2020-10-24 DIAGNOSIS — Z209 Contact with and (suspected) exposure to unspecified communicable disease: Secondary | ICD-10-CM

## 2020-10-24 NOTE — Telephone Encounter (Signed)
Please advise 

## 2020-10-24 NOTE — Telephone Encounter (Signed)
Patient called in stating she is starting a new job and one of the requirements is to have a CHEST X-RAY  to rule out TB. Wanting to know if she can come here and have that done.

## 2020-10-25 NOTE — Telephone Encounter (Signed)
Called patient to inform order has been placed.

## 2020-10-25 NOTE — Addendum Note (Signed)
Addended by: [name] A on: 10/25/2020 12:12 PM   Modules accepted: Orders

## 2020-10-25 NOTE — Telephone Encounter (Signed)
I sent the order to [facility], so she can go there anytime

## 2020-10-30 ENCOUNTER — Ambulatory Visit (INDEPENDENT_AMBULATORY_CARE_PROVIDER_SITE_OTHER)
Admission: RE | Admit: 2020-10-30 | Discharge: 2020-10-30 | Disposition: A | Discharge status: Home / Self Care | Payer: Medicare HMO | Source: Ambulatory Visit | Attending: Family Medicine | Admitting: Family Medicine

## 2020-10-30 ENCOUNTER — Other Ambulatory Visit: Payer: Self-pay

## 2020-10-30 DIAGNOSIS — Z0389 Encounter for observation for other suspected diseases and conditions ruled out: Secondary | ICD-10-CM | POA: Diagnosis not present

## 2020-10-30 DIAGNOSIS — M47814 Spondylosis without myelopathy or radiculopathy, thoracic region: Secondary | ICD-10-CM | POA: Diagnosis not present

## 2020-10-30 DIAGNOSIS — Z209 Contact with and (suspected) exposure to unspecified communicable disease: Secondary | ICD-10-CM | POA: Diagnosis not present

## 2020-10-31 ENCOUNTER — Telehealth: Payer: Self-pay

## 2020-10-31 NOTE — Telephone Encounter (Signed)
Patient called requesting  copy of the chest x-ray patient stated she needs it for her job

## 2020-11-01 NOTE — Telephone Encounter (Signed)
The report is ready to be picked up

## 2020-11-01 NOTE — Telephone Encounter (Signed)
Spoke with patient, results of chest x-ray given.   She is requesting to pick up copy of results on 11/02/20 after 11am.

## 2020-11-01 NOTE — Telephone Encounter (Signed)
Spoke with pt aware to pick up Chest X-ray report at the front office

## 2020-11-06 ENCOUNTER — Ambulatory Visit (INDEPENDENT_AMBULATORY_CARE_PROVIDER_SITE_OTHER): Payer: Medicare HMO

## 2020-11-06 ENCOUNTER — Telehealth: Payer: Self-pay | Admitting: Family Medicine

## 2020-11-06 DIAGNOSIS — Z0000 Encounter for general adult medical examination without abnormal findings: Secondary | ICD-10-CM | POA: Diagnosis not present

## 2020-11-06 NOTE — Telephone Encounter (Signed)
Please advise 

## 2020-11-06 NOTE — Progress Notes (Signed)
Subjective:   Wendy [name] is a 75 y.o. female who presents for Medicare Annual (Subsequent) preventive examination.  I connected with Baron Sane today by telephone and verified that I am speaking with the correct person using two identifiers. Location patient: home Location provider: work Persons participating in the virtual visit: patient, provider.   I discussed the limitations, risks, security and privacy concerns of performing an evaluation and management service by telephone and the availability of in person appointments. I also discussed with the patient that there may be a patient responsible charge related to this service. The patient expressed understanding and verbally consented to this telephonic visit.    Interactive audio and video telecommunications were attempted between this provider and patient, however failed, due to patient having technical difficulties OR patient did not have access to video capability.  We continued and completed visit with audio only.    Review of Systems    N/a Cardiac Risk Factors include: advanced age (>38mn, >>23women);diabetes mellitus;dyslipidemia;hypertension     Objective:    Today's Vitals   There is no height or weight on file to calculate BMI.  Advanced Directives 11/06/2020 10/11/2019 02/20/2017 04/18/2016  Does Patient Have a Medical Advance Directive? No No No No  Would patient like information on creating a medical advance directive? No - Patient declined No - Patient declined No - Patient declined -    Current Medications (verified) Outpatient Encounter Medications as of 11/06/2020  Medication Sig   ACCU-CHEK GUIDE test strip USE TO TEST ONCE DAILY   ALPRAZolam (XANAX) 1 MG tablet Take 1 tablet (1 mg total) by mouth at bedtime as needed for anxiety or sleep.   amLODipine (NORVASC) 10 MG tablet TAKE 1 TABLET BY MOUTH EVERY DAY   atorvastatin (LIPITOR) 10 MG tablet Take 1 tablet (10 mg total) by mouth daily.   b complex  vitamins tablet Take 1 tablet by mouth daily.   Blood Glucose Monitoring Suppl (ACCU-CHEK GUIDE) w/Device KIT Use to test sugars daily.   Blood Pressure Monitor KIT Use to check blood pressure.   CALCIUM-VITAMIN D PO Take 1 tablet by mouth daily as needed (supplemental).    Cholecalciferol (VITAMIN D3) 20 MCG (800 UNIT) TABS Take 1 tablet by mouth daily.    fluticasone (FLONASE) 50 MCG/ACT nasal spray SPRAY 2 SPRAYS INTO EACH NOSTRIL EVERY DAY   furosemide (LASIX) 20 MG tablet TAKE 1 TABLET (20 MG TOTAL) BY MOUTH DAILY AS NEEDED (FLUID RETENTION).   Ginkgo Biloba (GNP GINGKO BILOBA EXTRACT PO) Take by mouth.   glipiZIDE (GLUCOTROL) 5 MG tablet Take 2 tablets (10 mg total) by mouth 2 (two) times daily before a meal.   [START ON 11/22/2020] HYDROcodone-acetaminophen (NORCO) 10-325 MG tablet Take 1 tablet by mouth every 6 (six) hours as needed for moderate pain.   hydroxychloroquine (PLAQUENIL) 200 MG tablet TAKE 1 TABLET BY MOUTH EVERY DAY   Lancets (ACCU-CHEK SOFT TOUCH) lancets Test once per day and diagnosis code is E 11.9 and dispense for Aviva plus   levocetirizine (XYZAL) 5 MG tablet TAKE 1 TABLET BY MOUTH EVERY DAY IN THE EVENING   methocarbamol (ROBAXIN) 500 MG tablet Take 1 tablet (500 mg total) by mouth every 6 (six) hours as needed for muscle spasms.   Misc Natural Products (GINSENG COMPLEX PO) Take by mouth. As needed for energy   Omega-3 Fatty Acids (OMEGA 3 PO) Take by mouth daily.   omeprazole (PRILOSEC) 40 MG capsule TAKE 1 CAPSULE BY MOUTH EVERY  DAY   ondansetron (ZOFRAN-ODT) 4 MG disintegrating tablet TAKE 1 TABLET BY MOUTH EVERY 8 HOURS AS NEEDED FOR NAUSEA AND VOMITING   OVER THE COUNTER MEDICATION Active Liver Daily   Polyethylene Glycol 3350 (MIRALAX PO) Take by mouth as needed.   potassium chloride (KLOR-CON) 10 MEQ tablet Take 1 tablet (10 mEq total) by mouth daily.   pregabalin (LYRICA) 100 MG capsule Take 1 capsule (100 mg total) by mouth at bedtime.   sitaGLIPtin  (JANUVIA) 100 MG tablet Take 1 tablet (100 mg total) by mouth daily.   methylPREDNISolone (MEDROL DOSEPAK) 4 MG TBPK tablet As directed (Patient not taking: Reported on 11/06/2020)   naproxen sodium (ALEVE) 220 MG tablet Take 220 mg by mouth 2 (two) times daily as needed. (Patient not taking: Reported on 11/06/2020)   No facility-administered encounter medications on file as of 11/06/2020.    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 Gavin Pound    Past Surgical History:  Procedure Laterality Date   COLONOSCOPY  11/23/2019   per Dr. Ardis Hughs, adenmatous polyp, repeat in 7 yrs   ESOPHAGOGASTRODUODENOSCOPY  06/20/2008   at [facility], clear    [place] ENDOSCOPY  2010   [facility]    Family History  Problem Relation Age of Onset   Arthritis Other    Diabetes Other    Hypertension Other    Cancer Other        lung   Lung cancer Mother    Colon cancer Neg Hx    Colon polyps Neg Hx    Esophageal cancer Neg Hx    Rectal cancer Neg Hx    Stomach cancer Neg Hx    Social History   Socioeconomic History   Marital status: Widowed    Spouse name: Not on file   Number of children: Not on file   Years of education: Not on file   Highest education level: Not on file  Occupational History   Not on file  Tobacco Use   Smoking status: Some Days    Packs/day: 0.03    Years: 45.00    Pack years:  1.35    Types: Cigars, Cigarettes   Smokeless tobacco: Never   Tobacco comments:    1 black and mild every 2-3 days- no cigs - doesnt smoke the whole thing   Vaping Use   Vaping Use: Never used  Substance and Sexual Activity   Alcohol use: Yes    Alcohol/week: 0.0 standard drinks    Comment: occ   Drug use: Yes    Types: Marijuana   Sexual activity: Not on file  Other Topics Concern   Not on file  Social History Narrative   ** Merged History Encounter **  Social Determinants of Health   Financial Resource Strain: Low Risk    Difficulty of Paying Living Expenses: Not hard at all  Food Insecurity: No Food Insecurity   Worried About Charity fundraiser in the Last Year: Never true   [place] in the Last Year: Never true  Transportation Needs: No Transportation Needs   Lack of Transportation (Medical): No   Lack of Transportation (Non-Medical): No  Physical Activity: Sufficiently Active   Days of Exercise per Week: 5 days   Minutes of Exercise per Session: 60 min  Stress: No Stress Concern Present   Feeling of Stress : Not at all  Social Connections: Moderately Isolated   Frequency of Communication with Friends and Family: More than three times a week   Frequency of Social Gatherings with Friends and Family: More than three times a week   Attends Religious Services: More than 4 times per year   Active Member of [employer] or Organizations: No   Attends Archivist Meetings: Never   Marital Status: Widowed    Tobacco Counseling Ready to quit: Not Answered Counseling given: Not Answered Tobacco comments: 1 black and mild every 2-3 days- no cigs - doesnt smoke the whole thing    Clinical Intake:  Pre-visit preparation completed: Yes  Pain : No/denies pain     Nutritional Risks: None Diabetes: Yes CBG done?: No Did pt. bring in CBG monitor from home?: No  How often do you need to have someone help you when you read instructions, pamphlets, or  other written materials from your doctor or pharmacy?: 1 - Never What is the last grade level you completed in school?: high school  Diabetic?yes Nutrition Risk Assessment:  Has the patient had any N/V/D within the last 2 months?  No  Does the patient have any non-healing wounds?  No  Has the patient had any unintentional weight loss or weight gain?  No   Diabetes:  Is the patient diabetic?  Yes  If diabetic, was a CBG obtained today?  No  Did the patient bring in their glucometer from home?  No  How often do you monitor your CBG's? Daily .   Financial Strains and Diabetes Management:  Are you having any financial strains with the device, your supplies or your medication? No .  Does the patient want to be seen by Chronic Care Management for management of their diabetes?  No  Would the patient like to be referred to a Nutritionist or for Diabetic Management?  No   Diabetic Exams:  Diabetic Eye Exam: Completed 12/2019 Diabetic Foot Exam: Overdue, Pt has been advised about the importance in completing this exam. Pt is scheduled for diabetic foot exam on next office visit .   Interpreter Needed?: No  Information entered by :: [place] of Daily Living In your present state of health, do you have any difficulty performing the following activities: 11/06/2020  Hearing? N  Vision? N  Difficulty concentrating or making decisions? N  Walking or climbing stairs? N  Dressing or bathing? N  Doing errands, shopping? N  Preparing Food and eating ? N  Using the Toilet? N  In the past six months, have you accidently leaked urine? N  Do you have problems with loss of bowel control? N  Managing your Medications? N  Managing your Finances? N  Housekeeping or managing your Housekeeping? N  Some recent data might be hidden    Patient Care Team: Sarajane Jews,  Ishmael Holter, MD as PCP - General Kipp Brood Mariam Dollar, [facility] as Pharmacist (Pharmacist)  Indicate any recent Medical Services you  may have received from other than [facility] providers in the past year (date may be approximate).     Assessment:   This is a routine wellness examination for [place].  Hearing/Vision screen Vision Screening - Comments:: Annual eye exam wear glasses   Dietary issues and exercise activities discussed: Current Exercise Habits: Home exercise routine, Type of exercise: walking, Time (Minutes): 45, Frequency (Times/Week): 4, Weekly Exercise (Minutes/Week): 180, Intensity: Mild   Goals Addressed   None    Depression Screen PHQ 2/9 Scores 11/06/2020 11/06/2020 10/11/2019 12/21/2017 04/18/2016 04/20/2015  PHQ - 2 Score 0 0 0 1 0 0  PHQ- 9 Score - - 0 - - -    Fall Risk Fall Risk  11/06/2020 09/18/2020 10/11/2019 12/21/2017 04/18/2016  Falls in the past year? 0 0 0 No No  Number falls in past yr: 0 0 0 - -  Injury with Fall? 0 - 0 - -  Risk for fall due to : No Fall Risks - Medication side effect;Orthopedic patient - -  Follow up Falls evaluation completed - Falls evaluation completed;Falls prevention discussed - -    FALL RISK PREVENTION PERTAINING TO THE HOME:  Any stairs in or around the home? No  If so, are there any without handrails? No  Home free of loose throw rugs in walkways, pet beds, electrical cords, etc? Yes  Adequate lighting in your home to reduce risk of falls? Yes   ASSISTIVE DEVICES UTILIZED TO PREVENT FALLS:  Life alert? No  Use of a cane, walker or w/c? No  Grab bars in the bathroom? No  Shower chair or bench in shower? No  Elevated toilet seat or a handicapped toilet? No    Cognitive Function: Normal cognitive status assessed by direct observation by this Nurse Health Advisor. No abnormalities found.   MMSE - Mini Mental State Exam 04/18/2016  Not completed: (No Data)     6CIT Screen 10/11/2019  What Year? 0 points  What month? 0 points  What time? 0 points  Count back from 20 0 points  Months in reverse 0 points  Repeat phrase 2 points  Total Score 2     Immunizations Immunization History  Administered Date(s) Administered   Fluad Quad(high Dose 65+) 12/25/2018, 12/16/2019   Influenza Whole 02/08/2007, 12/01/2008   Influenza, High Dose Seasonal PF 01/07/2016, 12/17/2016, 12/21/2017   Influenza,inj,Quad PF,6+ Mos 12/21/2012, 10/26/2013   Influenza-Unspecified 12/17/2016   Moderna SARS-COV2 Booster Vaccination 02/06/2020, 06/19/2020   Moderna Sars-Covid-2 Vaccination 05/13/2019, 06/13/2019   Pneumococcal Conjugate-13 01/07/2016   Pneumococcal Polysaccharide-23 12/17/2016    TDAP status: Due, Education has been provided regarding the importance of this vaccine. Advised may receive this vaccine at local pharmacy or Health Dept. Aware to provide a copy of the vaccination record if obtained from local pharmacy or Health Dept. Verbalized acceptance and understanding.  Flu Vaccine status: Up to date  Pneumococcal vaccine status: Up to date  Covid-19 vaccine status: Completed vaccines  Qualifies for Shingles Vaccine? Yes   Zostavax completed No   Shingrix Completed?: No.    Education has been provided regarding the importance of this vaccine. Patient has been advised to call insurance company to determine out of pocket expense if they have not yet received this vaccine. Advised may also receive vaccine at local pharmacy or Health Dept. Verbalized acceptance and understanding.  Screening Tests Health Maintenance  Topic Date Due   TETANUS/TDAP  Never done   URINE MICROALBUMIN  10/20/2014   FOOT EXAM  10/27/2014   INFLUENZA VACCINE  10/01/2020   OPHTHALMOLOGY EXAM  12/19/2020 (Originally 05/21/2016)   Zoster Vaccines- Shingrix (1 of 2) 03/28/2021 (Originally 01/07/1997)   HEMOGLOBIN A1C  03/21/2021   MAMMOGRAM  03/02/2022   COLONOSCOPY (Pts 45-29yr Insurance coverage will need to be confirmed)  11/23/2026   DEXA SCAN  Completed   COVID-19 Vaccine  Completed   Hepatitis C Screening  Completed   PNA vac Low Risk Adult  Completed    HPV VACCINES  Aged Out    Health Maintenance  Health Maintenance Due  Topic Date Due   TETANUS/TDAP  Never done   URINE MICROALBUMIN  10/20/2014   FOOT EXAM  10/27/2014   INFLUENZA VACCINE  10/01/2020    Colorectal cancer screening: Type of screening: Colonoscopy. Completed 11/23/2019. Repeat every 7 years  Mammogram status: Completed 12/. Repeat every year  Bone Density status: Completed 01/27/2018. Results reflect: Bone density results: OSTEOPENIA. Repeat every 5 years.  Lung Cancer Screening: (Low Dose CT Chest recommended if Age 74-80years, 30 pack-year currently smoking OR have quit w/in 15years.) does not qualify.   Lung Cancer Screening Referral: n/a  Additional Screening:  Hepatitis C Screening: /does not qualify; Completed 05/18/2014  Vision Screening: Recommended annual ophthalmology exams for early detection of glaucoma and other disorders of the eye. Is the patient up to date with their annual eye exam?  Yes  Who is the provider or what is the name of the office in which the patient attends annual eye exams? Dr.Rowan  If pt is not established with a provider, would they like to be referred to a provider to establish care? No .   Dental Screening: Recommended annual dental exams for proper oral hygiene  Community Resource Referral / Chronic Care Management: CRR required this visit?  No   CCM required this visit?  No      Plan:     I have personally reviewed and noted the following in the patient's chart:   Medical and social history Use of alcohol, tobacco or illicit drugs  Current medications and supplements including opioid prescriptions.  Functional ability and status Nutritional status Physical activity Advanced directives List of other physicians Hospitalizations, surgeries, and ER visits in previous 12 months Vitals Screenings to include cognitive, depression, and falls Referrals and appointments  In addition, I have reviewed and discussed  with patient certain preventive protocols, quality metrics, and best practice recommendations. A written personalized care plan for preventive services as well as general preventive health recommendations were provided to patient.     [name]   [id number]  Nurse Notes: none

## 2020-11-06 NOTE — Telephone Encounter (Signed)
PT called into the office today to request a Dr Note for the time she was out of work that states why and how long she was out. PT was out from June 25th to August 11th per Dr she states. PT would like a call when it is ready.

## 2020-11-06 NOTE — Patient Instructions (Signed)
Wendy [name] , Thank you for taking time to come for your Medicare Wellness Visit. I appreciate your ongoing commitment to your health goals. Please review the following plan we discussed and let me know if I can assist you in the future.   Screening recommendations/referrals: Colonoscopy: 11/23/2019  due 2028 Mammogram: 03/02/2020 Bone Density: 01/27/2018 Recommended yearly ophthalmology/optometry visit for glaucoma screening and checkup Recommended yearly dental visit for hygiene and checkup  Vaccinations: Influenza vaccine: due in fall 2022  Pneumococcal vaccine: completed series  Tdap vaccine: due with injury  Shingles vaccine: declined     Advanced directives: none   Conditions/risks identified: none   Next appointment: none    Preventive Care 33 Years and Older, Female Preventive care refers to lifestyle choices and visits with your health care provider that can promote health and wellness. What does preventive care include? A yearly physical exam. This is also called an annual well check. Dental exams once or twice a year. Routine eye exams. Ask your health care provider how often you should have your eyes checked. Personal lifestyle choices, including: Daily care of your teeth and gums. Regular physical activity. Eating a healthy diet. Avoiding tobacco and drug use. Limiting alcohol use. Practicing safe sex. Taking low-dose aspirin every day. Taking vitamin and mineral supplements as recommended by your health care provider. What happens during an annual well check? The services and screenings done by your health care provider during your annual well check will depend on your age, overall health, lifestyle risk factors, and family history of disease. Counseling  Your health care provider may ask you questions about your: Alcohol use. Tobacco use. Drug use. Emotional well-being. Home and relationship well-being. Sexual activity. Eating habits. History of  falls. Memory and ability to understand (cognition). Work and work Statistician. Reproductive health. Screening  You may have the following tests or measurements: Height, weight, and BMI. Blood pressure. Lipid and cholesterol levels. These may be checked every 5 years, or more frequently if you are over 3 years old. Skin check. Lung cancer screening. You may have this screening every year starting at age 70 if you have a 30-pack-year history of smoking and currently smoke or have quit within the past 15 years. Fecal occult blood test (FOBT) of the stool. You may have this test every year starting at age 68. Flexible sigmoidoscopy or colonoscopy. You may have a sigmoidoscopy every 5 years or a colonoscopy every 10 years starting at age 72. Hepatitis C blood test. Hepatitis B blood test. Sexually transmitted disease (STD) testing. Diabetes screening. This is done by checking your blood sugar (glucose) after you have not eaten for a while (fasting). You may have this done every 1-3 years. Bone density scan. This is done to screen for osteoporosis. You may have this done starting at age 42. Mammogram. This may be done every 1-2 years. Talk to your health care provider about how often you should have regular mammograms. Talk with your health care provider about your test results, treatment options, and if necessary, the need for more tests. Vaccines  Your health care provider may recommend certain vaccines, such as: Influenza vaccine. This is recommended every year. Tetanus, diphtheria, and acellular pertussis (Tdap, Td) vaccine. You may need a Td booster every 10 years. Zoster vaccine. You may need this after age 25. Pneumococcal 13-valent conjugate (PCV13) vaccine. One dose is recommended after age 34. Pneumococcal polysaccharide (PPSV23) vaccine. One dose is recommended after age 44. Talk to your health care provider about  which screenings and vaccines you need and how often you need  them. This information is not intended to replace advice given to you by your health care provider. Make sure you discuss any questions you have with your health care provider. Document Released: 03/16/2015 Document Revised: 11/07/2015 Document Reviewed: 12/19/2014 Elsevier Interactive Patient Education  2017 [employer] Prevention in the Home Falls can cause injuries. They can happen to people of all ages. There are many things you can do to make your home safe and to help prevent falls. What can I do on the outside of my home? Regularly fix the edges of walkways and driveways and fix any cracks. Remove anything that might make you trip as you walk through a door, such as a raised step or threshold. Trim any bushes or trees on the path to your home. Use bright outdoor lighting. Clear any walking paths of anything that might make someone trip, such as rocks or tools. Regularly check to see if handrails are loose or broken. Make sure that both sides of any steps have handrails. Any raised decks and porches should have guardrails on the edges. Have any leaves, snow, or ice cleared regularly. Use sand or salt on walking paths during winter. Clean up any spills in your garage right away. This includes oil or grease spills. What can I do in the bathroom? Use night lights. Install grab bars by the toilet and in the tub and shower. Do not use towel bars as grab bars. Use non-skid mats or decals in the tub or shower. If you need to sit down in the shower, use a plastic, non-slip stool. Keep the floor dry. Clean up any water that spills on the floor as soon as it happens. Remove soap buildup in the tub or shower regularly. Attach bath mats securely with double-sided non-slip rug tape. Do not have throw rugs and other things on the floor that can make you trip. What can I do in the bedroom? Use night lights. Make sure that you have a light by your bed that is easy to reach. Do not use  any sheets or blankets that are too big for your bed. They should not hang down onto the floor. Have a firm chair that has side arms. You can use this for support while you get dressed. Do not have throw rugs and other things on the floor that can make you trip. What can I do in the kitchen? Clean up any spills right away. Avoid walking on wet floors. Keep items that you use a lot in easy-to-reach places. If you need to reach something above you, use a strong step stool that has a grab bar. Keep electrical cords out of the way. Do not use floor polish or wax that makes floors slippery. If you must use wax, use non-skid floor wax. Do not have throw rugs and other things on the floor that can make you trip. What can I do with my stairs? Do not leave any items on the stairs. Make sure that there are handrails on both sides of the stairs and use them. Fix handrails that are broken or loose. Make sure that handrails are as long as the stairways. Check any carpeting to make sure that it is firmly attached to the stairs. Fix any carpet that is loose or worn. Avoid having throw rugs at the top or bottom of the stairs. If you do have throw rugs, attach them to the floor with  carpet tape. Make sure that you have a light switch at the top of the stairs and the bottom of the stairs. If you do not have them, ask someone to add them for you. What else can I do to help prevent falls? Wear shoes that: Do not have high heels. Have rubber bottoms. Are comfortable and fit you well. Are closed at the toe. Do not wear sandals. If you use a stepladder: Make sure that it is fully opened. Do not climb a closed stepladder. Make sure that both sides of the stepladder are locked into place. Ask someone to hold it for you, if possible. Clearly mark and make sure that you can see: Any grab bars or handrails. First and last steps. Where the edge of each step is. Use tools that help you move around (mobility aids)  if they are needed. These include: Canes. Walkers. Scooters. Crutches. Turn on the lights when you go into a [name] area. Replace any light bulbs as soon as they burn out. Set up your furniture so you have a clear path. Avoid moving your furniture around. If any of your floors are uneven, fix them. If there are any pets around you, be aware of where they are. Review your medicines with your doctor. Some medicines can make you feel dizzy. This can increase your chance of falling. Ask your doctor what other things that you can do to help prevent falls. This information is not intended to replace advice given to you by your health care provider. Make sure you discuss any questions you have with your health care provider. Document Released: 12/14/2008 Document Revised: 07/26/2015 Document Reviewed: 03/24/2014 Elsevier Interactive Patient Education  2017 [employer].

## 2020-11-07 NOTE — Telephone Encounter (Signed)
NO. I saw her on 09-24-20 and wrote her out of work for 5 days. We never heard from her again after that. I cannot cover her for 6 weeks

## 2020-11-07 NOTE — Telephone Encounter (Signed)
Left pt a detailed message to call the office back

## 2020-11-12 ENCOUNTER — Telehealth: Payer: Self-pay | Admitting: Pharmacist

## 2020-11-12 NOTE — Chronic Care Management (AMB) (Signed)
Chronic Care Management Pharmacy Assistant   Name: Wendy [name]  MRN: [id number] DOB: 1945-07-12   Reason for Encounter: Disease State/ Diabetes Assessment Call.   Conditions to be addressed/monitored: DMII   Recent office visits:  None.  Recent consult visits:  None.  Hospital visits:  None in previous 6 months  Medications: Outpatient Encounter Medications as of 11/12/2020  Medication Sig   ACCU-CHEK GUIDE test strip USE TO TEST ONCE DAILY   ALPRAZolam (XANAX) 1 MG tablet Take 1 tablet (1 mg total) by mouth at bedtime as needed for anxiety or sleep.   amLODipine (NORVASC) 10 MG tablet TAKE 1 TABLET BY MOUTH EVERY DAY   atorvastatin (LIPITOR) 10 MG tablet Take 1 tablet (10 mg total) by mouth daily.   b complex vitamins tablet Take 1 tablet by mouth daily.   Blood Glucose Monitoring Suppl (ACCU-CHEK GUIDE) w/Device KIT Use to test sugars daily.   Blood Pressure Monitor KIT Use to check blood pressure.   CALCIUM-VITAMIN D PO Take 1 tablet by mouth daily as needed (supplemental).    Cholecalciferol (VITAMIN D3) 20 MCG (800 UNIT) TABS Take 1 tablet by mouth daily.    fluticasone (FLONASE) 50 MCG/ACT nasal spray SPRAY 2 SPRAYS INTO EACH NOSTRIL EVERY DAY   furosemide (LASIX) 20 MG tablet TAKE 1 TABLET (20 MG TOTAL) BY MOUTH DAILY AS NEEDED (FLUID RETENTION).   Ginkgo Biloba (GNP GINGKO BILOBA EXTRACT PO) Take by mouth.   glipiZIDE (GLUCOTROL) 5 MG tablet Take 2 tablets (10 mg total) by mouth 2 (two) times daily before a meal.   [START ON 11/22/2020] HYDROcodone-acetaminophen (NORCO) 10-325 MG tablet Take 1 tablet by mouth every 6 (six) hours as needed for moderate pain.   hydroxychloroquine (PLAQUENIL) 200 MG tablet TAKE 1 TABLET BY MOUTH EVERY DAY   Lancets (ACCU-CHEK SOFT TOUCH) lancets Test once per day and diagnosis code is E 11.9 and dispense for Aviva plus   levocetirizine (XYZAL) 5 MG tablet TAKE 1 TABLET BY MOUTH EVERY DAY IN THE EVENING   methocarbamol (ROBAXIN)  500 MG tablet Take 1 tablet (500 mg total) by mouth every 6 (six) hours as needed for muscle spasms.   methylPREDNISolone (MEDROL DOSEPAK) 4 MG TBPK tablet As directed (Patient not taking: Reported on 11/06/2020)   Misc Natural Products (GINSENG COMPLEX PO) Take by mouth. As needed for energy   naproxen sodium (ALEVE) 220 MG tablet Take 220 mg by mouth 2 (two) times daily as needed. (Patient not taking: Reported on 11/06/2020)   Omega-3 Fatty Acids (OMEGA 3 PO) Take by mouth daily.   omeprazole (PRILOSEC) 40 MG capsule TAKE 1 CAPSULE BY MOUTH EVERY DAY   ondansetron (ZOFRAN-ODT) 4 MG disintegrating tablet TAKE 1 TABLET BY MOUTH EVERY 8 HOURS AS NEEDED FOR NAUSEA AND VOMITING   OVER THE COUNTER MEDICATION Active Liver Daily   Polyethylene Glycol 3350 (MIRALAX PO) Take by mouth as needed.   potassium chloride (KLOR-CON) 10 MEQ tablet Take 1 tablet (10 mEq total) by mouth daily.   pregabalin (LYRICA) 100 MG capsule Take 1 capsule (100 mg total) by mouth at bedtime.   sitaGLIPtin (JANUVIA) 100 MG tablet Take 1 tablet (100 mg total) by mouth daily.   No facility-administered encounter medications on file as of 11/12/2020.   Fill History: atorvastatin (LIPITOR) tablet 08/18/2020 90   fluticasone (FLONASE) nasal spray 08/17/2020 90   furosemide (LASIX) tablet 08/18/2020 90   ACCU-CHEK GUIDE VI STRP 08/08/2020 90   HYDROcodone-acetaminophen (NORCO) tablet 10-325 mg  08/27/2020 30   hydroxychloroquine (PLAQUENIL) tablet 200 mg 09/06/2020 90   levocetirizine (XYZAL) tablet 5 mg 08/18/2020 90   lisinopril-hydrochlorothiazide (PRINZIDE,ZESTORETIC) tablet 20-25 mg 08/18/2020 90   methocarbamol (ROBAXIN) tablet 05/31/2020 90   omeprazole (PRILOSEC) capsule 06/23/2020 90   ondansetron (ZOFRAN-ODT) disintegrating tablet 08/29/2020 10   potassium chloride (K-DUR,KLOR-CON) CR tablet 08/18/2020 90   amLODIpine (NORVASC) tablet 07/13/2020 90   glipiZIDE (GLUCOTROL) tablet 09/02/2020 90   Recent  Relevant Labs: Lab Results  Component Value Date/Time   HGBA1C 8.8 (H) 09/18/2020 01:42 PM   HGBA1C 6.8 (H) 09/16/2019 03:11 PM   MICROALBUR 0.3 10/19/2013 09:44 AM   MICROALBUR 1.2 03/30/2009 03:00 PM    Kidney Function Lab Results  Component Value Date/Time   CREATININE 0.71 09/18/2020 01:42 PM   CREATININE 1.07 (H) 09/16/2019 03:11 PM   CREATININE 1.10 01/07/2017 04:50 PM   CREATININE 0.88 04/18/2016 03:35 PM   GFR 84.19 09/18/2020 01:42 PM   GFRNONAA 59 (L) 12/24/2016 02:03 PM   GFRAA >60 12/24/2016 02:03 PM    Current antihyperglycemic regimen:  Glipizide 71m - take 2 tablet twice daily before meals. Januvia 1086m- take 1 tablet by mouth daily.  What recent interventions/DTPs have been made to improve glycemic control:  None. Have there been any recent hospitalizations or ED visits since last visit with CPP? No Patient denies hypoglycemic symptoms, including None Patient reports hyperglycemic symptoms, including excessive thirst How often are you checking your blood sugar? once daily and 3-4 times daily What are your blood sugars ranging? Running 197 on 11/18/20. The highest reading lately was 267.  During the week, how often does your blood glucose drop below 70? Never Are you checking your feet daily/regularly? Yes. Daily.  Adherence Review: Is the patient currently on a STATIN medication? No Is the patient currently on ACE/ARB medication? No Does the patient have >5 day gap between last estimated fill dates? No  Notes: Spoke with patient and reviewed all medications as prescribed. Patient reports as taking all medications except the fish oil tablet. Patient reports no issues with medications or pharmacy's currently. Patient eats a bowl of cereal or oatmeal for breakfast. She will have a sandwich usually for lunch and she likes to have chicken or fish with vegetables for dinner. Patient does not add salt to her food and she drinks plenty of water daily. Patient works 6  days a week and gets plenty of activity. Patient thanked me for my call.   Care Gaps:  AWV - completed on 11/06/20 Tetanus/TDAP - never done Urine microalbumin - overdue since 10/20/14 Foot exam - overdue since 10/27/14 Influenza vaccine -  overdue since 10/01/20  Star Rating Drugs:  Atorvastatin 1032m last filled on 08/18/20 90DS at [facility]  Glipizide 5mg103mlast filled on 09/02/20 90DS at [facility] Sitagliptin 100mg4mot listed in fill history  [place] Lakemacist Assistant [phone]

## 2020-11-13 NOTE — Telephone Encounter (Signed)
I understand. Please generate a letter to prove she was out of work from 09-24-20 through 09-30-20

## 2020-11-13 NOTE — Telephone Encounter (Signed)
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 Ok to provide pt with the letter

## 2020-11-16 NOTE — Telephone Encounter (Cosign Needed)
2nd attempt

## 2020-11-16 NOTE — Telephone Encounter (Signed)
Pt letter is completed and pt was notified to pick up letter from the office, letter placed in the front office cabinet

## 2020-11-24 ENCOUNTER — Other Ambulatory Visit: Payer: Self-pay | Admitting: Family Medicine

## 2020-11-26 ENCOUNTER — Other Ambulatory Visit: Payer: Self-pay | Admitting: Family Medicine

## 2020-12-05 ENCOUNTER — Ambulatory Visit (INDEPENDENT_AMBULATORY_CARE_PROVIDER_SITE_OTHER): Payer: Medicare HMO | Admitting: Family Medicine

## 2020-12-05 ENCOUNTER — Other Ambulatory Visit: Payer: Self-pay

## 2020-12-05 ENCOUNTER — Encounter: Payer: Self-pay | Admitting: Family Medicine

## 2020-12-05 VITALS — BP 132/80 | HR 82 | Temp 98.6°F | Wt 159.0 lb

## 2020-12-05 DIAGNOSIS — F1190 Opioid use, unspecified, uncomplicated: Secondary | ICD-10-CM | POA: Diagnosis not present

## 2020-12-05 DIAGNOSIS — G8929 Other chronic pain: Secondary | ICD-10-CM | POA: Diagnosis not present

## 2020-12-05 DIAGNOSIS — E1140 Type 2 diabetes mellitus with diabetic neuropathy, unspecified: Secondary | ICD-10-CM

## 2020-12-05 DIAGNOSIS — M5440 Lumbago with sciatica, unspecified side: Secondary | ICD-10-CM

## 2020-12-05 MED ORDER — METFORMIN HCL 500 MG PO TABS: 500.0000 mg | ORAL_TABLET | Freq: Two times a day (BID) | ORAL | 3 refills | Status: DC

## 2020-12-05 MED ORDER — HYDROCODONE-ACETAMINOPHEN 10-325 MG PO TABS: 1.0000 | ORAL_TABLET | Freq: Four times a day (QID) | ORAL | 0 refills | Status: DC | PRN

## 2020-12-05 NOTE — Progress Notes (Signed)
   Subjective:    Patient ID: Wendy [name], female    DOB: 1945/07/12, 75 y.o.   MRN: [id number]  HPI Here for pain management, she is doing well.    Review of Systems     Objective:   Physical Exam        Assessment & Plan:  Pain management. Indication for chronic opioid: low back pain Medication and dose: Norco 10-325 # pills per month: 120 Last UDS date: 09-18-20 Opioid Treatment Agreement signed (Y/N): 06-19-17 Opioid Treatment Agreement last reviewed with patient:  12-05-20 [facility] reviewed this encounter (include red flags): Yes Meds were refilled.  [name], MD

## 2020-12-14 DIAGNOSIS — H04123 Dry eye syndrome of bilateral lacrimal glands: Secondary | ICD-10-CM | POA: Diagnosis not present

## 2020-12-14 DIAGNOSIS — H524 Presbyopia: Secondary | ICD-10-CM | POA: Diagnosis not present

## 2020-12-14 DIAGNOSIS — E119 Type 2 diabetes mellitus without complications: Secondary | ICD-10-CM | POA: Diagnosis not present

## 2020-12-21 ENCOUNTER — Other Ambulatory Visit: Payer: Medicare HMO

## 2020-12-21 ENCOUNTER — Other Ambulatory Visit (INDEPENDENT_AMBULATORY_CARE_PROVIDER_SITE_OTHER): Payer: Medicare HMO

## 2020-12-21 ENCOUNTER — Other Ambulatory Visit: Payer: Self-pay

## 2020-12-21 ENCOUNTER — Telehealth: Payer: Self-pay | Admitting: Family Medicine

## 2020-12-21 DIAGNOSIS — E119 Type 2 diabetes mellitus without complications: Secondary | ICD-10-CM

## 2020-12-21 LAB — HEMOGLOBIN A1C: Hgb A1c MFr Bld: 11.4 % — ABNORMAL HIGH (ref 4.6–6.5)

## 2020-12-21 NOTE — Telephone Encounter (Signed)
I referred her to Endocrinology for the diabetes

## 2020-12-22 ENCOUNTER — Other Ambulatory Visit: Payer: Self-pay | Admitting: Family Medicine

## 2020-12-26 DIAGNOSIS — E1140 Type 2 diabetes mellitus with diabetic neuropathy, unspecified: Secondary | ICD-10-CM | POA: Insufficient documentation

## 2020-12-26 NOTE — Addendum Note (Signed)
Addended by: [name] A on: 12/26/2020 04:33 PM   Modules accepted: Orders

## 2020-12-27 ENCOUNTER — Telehealth: Payer: Self-pay | Admitting: Family Medicine

## 2020-12-27 NOTE — Telephone Encounter (Signed)
Patient wants to know if she is due for any other vaccines since she has a flu shot scheduled for 11/2.       Good callback number is [phone]    Please advise

## 2020-12-27 NOTE — Telephone Encounter (Signed)
Documented vaccines are: Influenza to receive in 01/02/21 COVID-19, and boosters Pneumonia vacc 13+23.    No Shingrix vaccine and Tetanus vaccine documented.   Please advise which vaccines are needed for patient.

## 2020-12-28 NOTE — Telephone Encounter (Signed)
Spoke with patient about message.   Voiced understanding, will call pharmacy to schedule Covid booster, and shingles vaccine.

## 2020-12-28 NOTE — Telephone Encounter (Signed)
She should get the flu shot and a TDaP here. Then get the shingles vaccine and the new bivalent Covid vaccine at her pharmacy

## 2020-12-28 NOTE — Telephone Encounter (Signed)
FYI Nurse Nelida Gores informed me that due to patient have Medicare insurance .  Medicare will NOT pay for TDAP injection here in the office.    Called patient left voicemail to have patient check with local preferred pharmacy to receive injection

## 2021-01-02 ENCOUNTER — Other Ambulatory Visit: Payer: Self-pay

## 2021-01-02 ENCOUNTER — Ambulatory Visit: Payer: Medicare HMO

## 2021-01-02 ENCOUNTER — Telehealth: Payer: Self-pay | Admitting: Family Medicine

## 2021-01-02 ENCOUNTER — Ambulatory Visit (INDEPENDENT_AMBULATORY_CARE_PROVIDER_SITE_OTHER): Payer: Medicare HMO

## 2021-01-02 DIAGNOSIS — Z23 Encounter for immunization: Secondary | ICD-10-CM

## 2021-01-02 NOTE — Telephone Encounter (Signed)
Disability Parking Placard to be filled out--placed in dr's folder.  Call [phone] upon completion.

## 2021-01-07 NOTE — Telephone Encounter (Signed)
Called pt advised that her Disability parking placard is ready for pick up at the office, pt state that she will pick it up tomorrow 01/08/2021. Form has been placed at the front office cabinet, copy sent to scanning

## 2021-01-10 ENCOUNTER — Other Ambulatory Visit: Payer: Self-pay | Admitting: Family Medicine

## 2021-01-22 ENCOUNTER — Telehealth: Payer: Self-pay | Admitting: Pharmacist

## 2021-01-22 NOTE — Chronic Care Management (AMB) (Signed)
    Chronic Care Management Pharmacy Assistant   Name: Wendy [name]  MRN: [id number] DOB: 1945-04-02  01/23/2021 APPOINTMENT REMINDER   Wendy [name] was reminded to have all medications, supplements and any blood glucose and blood pressure readings available for review with [name], Pharm. D, at her telephone visit on 01/23/2021  at 11:30.   Questions: Have you had any recent office visit or specialist visit outside of [place]? No  Are there any concerns you would like to discuss during your office visit? No  Are you having any problems obtaining your medications? (Whether it pharmacy issues or cost) No  If patient has any PAP medications ask if they are having any problems getting their PAP medication or refill? No  Care Gaps: AWV - completed on 11/06/20 Last BP - 132/80 on 12/05/2020 A1C - 11.4 on 12/21/2020 Tetanus/TDAP - never done Urine microalbumin - overdue  Foot exam - overdue  Influenza vaccine -  overdue   Star Rating Drug: Atorvastatin 10mg  - last [facility]17/2020 [facility]  Glipizide 5mg  - last filled on 11/24/2020 [facility] Sitagliptin 100mg  - 12/09/2020 [facility]  Any gaps in medications fill history? No  [place]  Catering manager [phone]

## 2021-01-23 ENCOUNTER — Ambulatory Visit (INDEPENDENT_AMBULATORY_CARE_PROVIDER_SITE_OTHER): Payer: Medicare HMO | Admitting: Pharmacist

## 2021-01-23 DIAGNOSIS — E1140 Type 2 diabetes mellitus with diabetic neuropathy, unspecified: Secondary | ICD-10-CM

## 2021-01-23 DIAGNOSIS — I10 Essential (primary) hypertension: Secondary | ICD-10-CM

## 2021-01-23 NOTE — Progress Notes (Signed)
Chronic Care Management Pharmacy Note  01/23/2021 Name:  Wendy [name] MRN:  [id number] DOB:  1945-05-07  Summary: A1c increased and not at goal < 7% Pts blood sugars have been decreasing recently  Recommendations/Changes made from today's visit: -Recommended checking blood pressure at least weekly at home -Recommended bringing BP cuff to the office to ensure accuracy -Recommend repeat A1c in 2 months and consider switching DPP4 to GLP1  Plan: Follow up DM assessment in 1 month Scheduled PCP follow up in 2 months  Subjective: Wendy [name] is an 75 y.o. year old female who is a primary patient of Laurey Morale, MD.  The CCM team was consulted for assistance with disease management and care coordination needs.    Engaged with patient by telephone for follow up visit in response to provider referral for pharmacy case management and/or care coordination services.   Consent to Services:  The patient was given information about Chronic Care Management services, agreed to services, and gave verbal consent prior to initiation of services.  Please see initial visit note for detailed documentation.   Patient Care Team: Laurey Morale, MD as PCP - General Viona Gilmore, [facility] as Pharmacist (Pharmacist)  Recent office visits: 12/05/20 [name], MD: Patient presented for video visit for pain management.  Prescribed metformin 500 mg BID. Refilled hydrocodone-APAP 10-385m. Referred to endocrinology.  11/06/20 [name]: Patient presented for AWV.   09/25/20 [name]: Patient presented for sharp pain in her left neck area. Prescribed methocarbamol every 6 hours as needed and administered Depomedrol.  09/21/20 [name]: Patient presented for video visit for pain management. Refilled hydrocodone-APAP 10-3261m   09/18/20 [name]: Patient presented for annual exam. D/C'd gabapentin and switched to Lyrica 200 mg at bedtime. A1c increased to 8.8% and  prescribed Januvia 100 mg.  08/07/20 [name] (PCP) - seen for hypertension and other chronic conditions. Changed glipizide to 1029mwice daily from 5mg82mice daily. Follow up in 1 month.   Recent consult visits: 03/02/20 Patient completed mammogram.  Hospital visits: None in previous 6 months  Objective:  Lab Results  Component Value Date   CREATININE 0.71 09/18/2020   BUN 12 09/18/2020   GFR 84.19 09/18/2020   GFRNONAA 59 (L) 12/24/2016   GFRAA >60 12/24/2016   NA 140 09/18/2020   K 3.9 09/18/2020   CALCIUM 9.3 09/18/2020   CO2 28 09/18/2020   GLUCOSE 138 (H) 09/18/2020    Lab Results  Component Value Date/Time   HGBA1C 11.4 (H) 12/21/2020 02:20 PM   HGBA1C 8.8 (H) 09/18/2020 01:42 PM   GFR 84.19 09/18/2020 01:42 PM   GFR 63.15 01/07/2017 04:50 PM   MICROALBUR 0.3 10/19/2013 09:44 AM   MICROALBUR 1.2 03/30/2009 03:00 PM    Last diabetic Eye exam:  Lab Results  Component Value Date/Time   HMDIABEYEEXA No Retinopathy 05/22/2015 10:11 AM    Last diabetic Foot exam: No results found for: HMDIABFOOTEX   Lab Results  Component Value Date   CHOL 142 09/18/2020   HDL 57.20 09/18/2020   LDLCALC 72 09/18/2020   LDLDIRECT 143.6 06/05/2010   TRIG 64.0 09/18/2020   CHOLHDL 2 09/18/2020    Hepatic Function Latest Ref Rng & Units 09/18/2020 09/16/2019 12/24/2016  Total Protein 6.0 - 8.3 g/dL 6.9 6.7 7.9  Albumin 3.5 - 5.2 g/dL 3.9 - 3.7  AST 0 - 37 U/L _0 ALT 0 - 35 U/L 14 15 13(L)  Alk Phosphatase 39 - 117 U/L 66 - 69  Total Bilirubin 0.2 - 1.2 mg/dL 0.6 0.8 1.2  Bilirubin, Direct 0.0 - 0.3 mg/dL 0.1 0.2 -    Lab Results  Component Value Date/Time   TSH 1.30 09/18/2020 01:42 PM   TSH 0.80 09/16/2019 03:11 PM   FREET4 0.74 09/18/2020 01:42 PM   FREET4 0.74 08/25/2011 12:17 PM    CBC Latest Ref Rng & Units 09/18/2020 09/16/2019 12/24/2016  WBC 4.0 - 10.5 K/uL 5.5 5.3 10.3  Hemoglobin 12.0 - 15.0 g/dL 12.7 13.2 13.9  Hematocrit 36.0 - 46.0 % 38.4 40.8  42.1  Platelets 150.0 - 400.0 K/uL 197.0 205 197    No results found for: VD25OH  Clinical ASCVD: No  The ASCVD Risk score (Arnett DK, et al., 2019) failed to calculate for the following reasons:   Unable to determine if patient is Non-Hispanic African American    Depression screen [facility] 2/9 11/06/2020 11/06/2020 10/11/2019  Decreased Interest 0 0 0  Down, Depressed, Hopeless 0 0 0  PHQ - 2 Score 0 0 0  Altered sleeping - - 0  Tired, decreased energy - - 0  Change in appetite - - 0  Feeling bad or failure about yourself  - - 0  Trouble concentrating - - 0  Moving slowly or fidgety/restless - - 0  Suicidal thoughts - - 0  PHQ-9 Score - - 0  Difficult doing work/chores - - Not difficult at all      Social History   Tobacco Use  Smoking Status Some Days   Packs/day: 0.03   Years: 45.00   Pack years: 1.35   Types: Cigars, Cigarettes  Smokeless Tobacco Never  Tobacco Comments   1 black and mild every 2-3 days- no cigs - doesnt smoke the whole thing    BP Readings from Last 3 Encounters:  12/05/20 132/80  09/25/20 140/82  09/21/20 140/70   Pulse Readings from Last 3 Encounters:  12/05/20 82  09/25/20 98  09/21/20 91   Wt Readings from Last 3 Encounters:  12/05/20 159 lb (72.1 kg)  09/25/20 160 lb 9.6 oz (72.8 kg)  09/21/20 165 lb 9.6 oz (75.1 kg)   BMI Readings from Last 3 Encounters:  12/05/20 27.29 kg/m  09/25/20 27.57 kg/m  09/21/20 28.43 kg/m    Assessment/Interventions: Review of patient past medical history, allergies, medications, health status, including review of consultants reports, laboratory and other test data, was performed as part of comprehensive evaluation and provision of chronic care management services.   SDOH:  (Social Determinants of Health) assessments and interventions performed: No   CCM Care Plan  Allergies  Allergen Reactions   Aspirin Nausea And Vomiting   Tramadol Nausea And Vomiting    Medications Reviewed Today      Reviewed by Laurey Morale, MD (Physician) on 12/05/20 at 1346  Med List Status: <None>   Medication Order Taking? Sig Documenting Provider Last Dose Status Informant  ACCU-CHEK GUIDE test strip [id number] No USE TO TEST ONCE DAILY Laurey Morale, MD Taking Active   ALPRAZolam Duanne Moron) 1 MG tablet [id number] No Take 1 tablet (1 mg total) by mouth at bedtime as needed for anxiety or sleep. Laurey Morale, MD Taking Active   amLODipine (NORVASC) 10 MG tablet [id number] No TAKE 1 TABLET BY MOUTH EVERY DAY Laurey Morale, MD Taking Active   atorvastatin (LIPITOR) 10 MG tablet [id number]  TAKE 1 TABLET BY MOUTH EVERY DAY Laurey Morale, MD  Active   b complex vitamins tablet [id number] No Take 1 tablet by mouth daily. [provider] Taking Active Self  Blood Glucose Monitoring Suppl (ACCU-CHEK GUIDE) w/Device KIT [id number] No Use to test sugars daily. Laurey Morale, MD Taking Active   Blood Pressure Monitor KIT [id number] No Use to check blood pressure. Laurey Morale, MD Taking Active   CALCIUM-VITAMIN D PO [id number] No Take 1 tablet by mouth daily as needed (supplemental).  [provider] Taking Active Self  Cholecalciferol (VITAMIN D3) 20 MCG (800 UNIT) TABS [id number] No Take 1 tablet by mouth daily.  [provider] Taking Active Self  fluticasone (FLONASE) 50 MCG/ACT nasal spray [id number]  SPRAY 2 SPRAYS INTO EACH NOSTRIL EVERY DAY Laurey Morale, MD  Active   furosemide (LASIX) 20 MG tablet [id number]  TAKE 1 TABLET (20 MG TOTAL) BY MOUTH DAILY AS NEEDED (FLUID RETENTION). Laurey Morale, MD  Active   Ginkgo Biloba (GNP GINGKO BILOBA EXTRACT PO) [id number] No Take by mouth. [provider] Taking Active   glipiZIDE (GLUCOTROL) 5 MG tablet [id number] No Take 2 tablets (10 mg total) by mouth 2 (two) times daily before a meal. Laurey Morale, MD Taking Active   HYDROcodone-acetaminophen [facility]) 10-325 MG tablet [id number] No Take 1 tablet by mouth every 6 (six) hours as  needed for moderate pain. Laurey Morale, MD Taking Active   hydroxychloroquine (PLAQUENIL) 200 MG tablet [id number] No TAKE 1 TABLET BY MOUTH EVERY DAY Laurey Morale, MD Taking Active   Lancets (ACCU-CHEK SOFT [facility]) lancets [id number] No Test once per day and diagnosis code is E 11.9 and dispense for Aviva plus Laurey Morale, MD Taking Active   levocetirizine (XYZAL) 5 MG tablet [id number]  TAKE 1 TABLET BY MOUTH EVERY DAY IN THE Billee Cashing, MD  Active   methocarbamol (ROBAXIN) 500 MG tablet [id number] No Take 1 tablet (500 mg total) by mouth every 6 (six) hours as needed for muscle spasms. Laurey Morale, MD Taking Active   methylPREDNISolone (MEDROL DOSEPAK) 4 MG TBPK tablet [id number] No As directed  Patient not taking: Reported on 11/06/2020   Laurey Morale, MD Not Taking Active   Misc Natural Products [facility] COMPLEX PO) [id number] No Take by mouth. As needed for energy [provider] Taking Active   naproxen sodium (ALEVE) 220 MG tablet [id number] No Take 220 mg by mouth 2 (two) times daily as needed.  Patient not taking: Reported on 11/06/2020   [provider] Not Taking Active   Omega-3 Fatty Acids (OMEGA 3 PO) [id number] No Take by mouth daily. [provider] Taking Active   omeprazole (PRILOSEC) 40 MG capsule [id number] No TAKE 1 CAPSULE BY MOUTH EVERY DAY Laurey Morale, MD Taking Active   ondansetron (ZOFRAN-ODT) 4 MG disintegrating tablet [id number]  TAKE 1 TABLET BY MOUTH EVERY 8 HOURS AS NEEDED FOR NAUSEA AND VOMITING Laurey Morale, MD  Active   OVER THE COUNTER MEDICATION [id number] No Active Liver Daily [provider] Taking Active   Polyethylene Glycol 3350 (MIRALAX PO) [id number] No Take by mouth as needed. [provider] Taking Active   potassium chloride (KLOR-CON) 10 MEQ tablet [id number]  TAKE 1 TABLET BY MOUTH EVERY DAY Laurey Morale, MD  Active   pregabalin (LYRICA) 100 MG capsule [id number] No Take 1 capsule (100 mg  total) by mouth at bedtime. Laurey Morale, MD Taking Active   sitaGLIPtin [facility]) 100 MG tablet [id number] No Take  1 tablet (100 mg total) by mouth daily. 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    Immunization History  Administered Date(s) Administered   Fluad Quad(high Dose 65+) 12/25/2018, 12/16/2019, 01/02/2021   Influenza Whole 02/08/2007, 12/01/2008   Influenza, High Dose Seasonal PF 01/07/2016, 12/17/2016, 12/21/2017   Influenza,inj,Quad PF,6+ Mos 12/21/2012, 10/26/2013   Influenza-Unspecified 12/17/2016   Moderna SARS-COV2 Booster Vaccination 02/06/2020, 06/19/2020   Moderna Sars-Covid-2 Vaccination 05/13/2019, 06/13/2019   Pneumococcal Conjugate-13 01/07/2016   Pneumococcal Polysaccharide-23 12/17/2016   Patient reports her blood sugars have been decreasing and thinks her A1c increase is all related to her steroid injection. She was hoping to not have to see endocrinology and hasn't heard anything from them after the initial phone call.  Conditions to be addressed/monitored:  Hypertension, Diabetes, Anxiety, Osteoporosis, Tobacco use, Allergic Rhinitis and pain, neuropathy, edema  Conditions addressed this visit: Hypertension, diabetes  Care Plan : CCM Pharmacy Care Plan  Updates made by Viona Gilmore, [place] since 01/23/2021 12:00 AM     Problem: Problem: Hypertension, Diabetes, Anxiety, Osteoporosis, Tobacco use, Allergic Rhinitis and pain, neuropathy, edema      Long-Range Goal: Patient-Specific Goal   Start Date:  05/17/2020  Expected End Date: 05/17/2021  Recent Progress: On track  Priority: High  Note:   Current Barriers:  Unable to independently monitor therapeutic efficacy Unable to achieve control of cholesterol and blood pressure  Does not adhere to prescribed medication regimen  Pharmacist Clinical Goal(s):  Patient will achieve adherence to monitoring guidelines and medication adherence to achieve therapeutic efficacy achieve control of blood pressure as evidenced by blood pressure readings  through collaboration with PharmD and provider.   Interventions: 1:1 collaboration with Laurey Morale, MD regarding development and update of comprehensive plan of care as evidenced by provider attestation and co-signature Inter-disciplinary care team collaboration (see longitudinal plan of care) Comprehensive medication review performed; medication list updated in electronic medical record  Hypertension (BP goal <140/90) -Uncontrolled -Current treatment: Lisinopril-HCTZ 20-46m, 1 tablet once daily  Furosemide 20 mg 1 tablet daily -Medications previously tried: none  -Current home readings: could not provide readings (not sure where her cuff is) -Current dietary habits: did not discuss -Current exercise habits: walks at work and doing arm rotations and leg rotations; using an exercise book at home from PT -Denies hypotensive/hypertensive symptoms -Educated on Exercise goal of 150 minutes per week; Importance of home blood pressure monitoring; Proper BP monitoring technique; -Counseled to monitor BP at home at least weekly, document, and provide log at future appointments -Counseled on diet and exercise extensively Recommended to continue current medication  Hyperlipidemia: (LDL goal < 70) -Not ideally controlled -Current treatment: Atorvastatin 123m 1 tablet once daily -Medications previously tried: none  -Current dietary patterns: doesn't eat pork; limits eggs -Current exercise habits:  walks at work and doing arm rotations and leg rotations; using an exercise book at home from PT -Educated on Cholesterol goals;  Benefits of statin for ASCVD risk reduction; Importance of limiting foods high in  cholesterol; Exercise goal of 150 minutes per week; -Counseled on diet and exercise extensively Recommended to continue current medication   Diabetes (A1c goal <7%) -Uncontrolled -Current medications: Glipizide 75m, 2 tablets twice daily before a meal Januvia 100 mg 1 tablet daily Metformin 500 mg 1 tablet twice daily -Medications previously tried: metformin (stomach upset), Januvia (low BGs)  -Current home glucose readings fasting glucose: 139, 115, 102, 69 (one time), 114, 66, 137, 96, 97, 120, 86, 11, 129, 152, 130, 95, 101 (checking 2-3 times a day) post prandial glucose: n/a -Denies hypoglycemic/hyperglycemic symptoms -Current meal patterns:  breakfast: Ensure  lunch: PB crackers and cheese and bologna; sandwich or crackers  dinner: brussel sprouts, broccoli, green giant mixed veggies snacks: patient was eating more candy; eating more vegetables and fruit drinks: water; cranberry juice occasionally; more and water and no fruit juice and soda  -Current exercise: walks at work and doing arm rotations and leg rotations; using an exercise book at home from PT -Educated on A1c and blood sugar goals; Exercise goal of 150 minutes per week; Carbohydrate counting and/or plate method -Counseled to check feet daily and get yearly eye exams -Counseled on diet and exercise extensively Recommended to continue current medication  Edema (Goal: minimize swelling) -Controlled -Current treatment  Furosemide 273m 1 tablet once daily as needed for fluid retention Potassium chloride 1033m 1 tablet once daily  -Medications previously tried: none  -Recommended to continue current medication  Rheumatoid arthritis (Goal: minimize symptoms and manage pain) -Controlled -Current treatment   Hydroxychloroquine 200m79m tablet once daily  -Medications previously tried: none  -Recommended to continue current medication  Neuropathy (Goal: minimize pain) -Controlled -Current treatment  Gabapentin 100mg43mcapsule twice daily -Medications previously tried: none  -Recommended to continue current medication  Pain (Goal: minimize pain) -Controlled -Current treatment  Hydrocodone/ APAP 10/325mg,63mablet every six hours as needed for moderate pain (takes 3-4x per day)  Methocarbamol 500 mg 1 tablet at bedtime -Medications previously tried: n/a  -Recommended to continue current medication Counseled on not taking hydrocodone at the same time as alprazolam due to sedation Patient backed down on use of methocarbamol due to sluggishness.  Anxiety (Goal: minimize symptoms) -Controlled -Current treatment: Alprazolam (Xanax) 1mg, 170mblet at bedtime as needed for anxiety or sleep  -Medications previously tried/failed: n/a -GAD7: n/a -Educated on Benefits of medication for symptom control Benefits of cognitive-behavioral therapy with or without medication -Recommended to continue current medication  Tobacco use (Goal quit smoking) -Controlled -Previous quit attempts: cold turkey [place] treatment  No medications -Patient smokes After 30 minutes of waking -Patient triggers include:  habit -On a scale of 1-10, reports MOTIVATION to quit is 10 -On a scale of 1-10, reports CONFIDENCE in quitting is 10 - Patient quit smoking a month ago and congratulated her on this accomplishment -Recommended to continue without smoking  Allergic rhinitis (Goal: minimize symptoms of allergies) -Controlled -Current treatment  levocetirizine 5mg, 1 47mlet every evening  Fluticasone 50mcg/ac34msal spray, 2 sprays into each nostril every day  -Medications previously tried: none  -Recommended to continue current medication Counseled on avoiding allergy triggers  GERD (Goal: minimize  symptoms) -Controlled -Current treatment  Omeprazole 40 mg 1 capsule daily -Medications previously tried: none  -Recommended to continue current medication   Health Maintenance -Vaccine gaps: tetanus, shingles -Current therapy:  Vitamin B complex 1 tablet daily Ginkgo biloba 1 tablet daily Ginseng complex daily Active liver daily Vitamin D3 1000 units, 1 tablet once daily Calcium-mag-zinc daily  -  Educated on Herbal supplement research is limited and benefits usually cannot be proven -Patient is satisfied with current therapy and denies issues -Educated on risk vs benefit of supplementation  Patient Goals/Self-Care Activities Patient will:  - take medications as prescribed check glucose daily, document, and provide at future appointments check blood pressure weekly, document, and provide at future appointments  Follow Up Plan: Telephone follow up appointment with care management team member scheduled for: 3 months        Medication Assistance: None required.  Patient affirms current coverage meets needs.  Compliance/Adherence/Medication fill history: Care Gaps: Tetanus, COVID booster, eye exam, urine microalbumin, foot exam  Star-Rating Drugs: Atorvastatin 57m - last [facility]17/2020 [facility]  Glipizide 582m- last filled on 11/24/2020 [facility] Sitagliptin 10077m 12/09/2020 90[facility] at [facility]  Patient's preferred pharmacy is:  [id number] [id number]: [phone]: [phone] pill box? No - reports medications in separate bags. Has own management system.Reports never forgetting to take it.   Pt endorses 100% compliance  We discussed: Current pharmacy is preferred with insurance plan and patient is satisfied with pharmacy services Patient decided to: Continue current medication management strategy  Care Plan and Follow Up Patient Decision:  Patient agrees to  Care Plan and Follow-up.  Plan: Telephone follow up appointment with care management team member scheduled for:  3 months  MadeJeni SallesarmD BCACWoodlakermacist [phone]

## 2021-01-30 DIAGNOSIS — F17210 Nicotine dependence, cigarettes, uncomplicated: Secondary | ICD-10-CM

## 2021-01-30 DIAGNOSIS — E785 Hyperlipidemia, unspecified: Secondary | ICD-10-CM

## 2021-01-30 DIAGNOSIS — I10 Essential (primary) hypertension: Secondary | ICD-10-CM | POA: Diagnosis not present

## 2021-01-30 DIAGNOSIS — E1140 Type 2 diabetes mellitus with diabetic neuropathy, unspecified: Secondary | ICD-10-CM | POA: Diagnosis not present

## 2021-02-07 ENCOUNTER — Other Ambulatory Visit: Payer: Self-pay

## 2021-02-07 ENCOUNTER — Telehealth: Payer: Self-pay | Admitting: Family Medicine

## 2021-02-07 MED ORDER — ACCU-CHEK GUIDE VI STRP: ORAL_STRIP | 1 refills | Status: DC

## 2021-02-07 MED ORDER — ACCU-CHEK GUIDE W/DEVICE KIT: PACK | 0 refills | Status: AC

## 2021-02-07 NOTE — Telephone Encounter (Signed)
Meter and test strips sent to [facility] [address]   Pharmacy will notify patient when ready for pick up.

## 2021-02-07 NOTE — Telephone Encounter (Signed)
Pt has lost her accu chek guide meter and would like another meter and testing strips [facility] to  [id number] [address] Phone:  [phone]  Fax:  [phone]

## 2021-02-18 ENCOUNTER — Telehealth: Payer: Self-pay | Admitting: Pharmacist

## 2021-02-18 NOTE — Chronic Care Management (AMB) (Signed)
Chronic Care Management Pharmacy Assistant   Name: Wendy [name]  MRN: [id number] DOB: [birth year]  Reason for Encounter: Disease State / Diabetes Assessment Call   Conditions to be addressed/monitored: DMII  Recent office visits:  None  Recent consult visits:  None  Hospital visits:  None  Medications: Outpatient Encounter Medications as of 02/18/2021  Medication Sig   atorvastatin (LIPITOR) 10 MG tablet TAKE 1 TABLET BY MOUTH EVERY DAY   fluticasone (FLONASE) 50 MCG/ACT nasal spray SPRAY 2 SPRAYS INTO EACH NOSTRIL EVERY DAY   furosemide (LASIX) 20 MG tablet TAKE 1 TABLET (20 MG TOTAL) BY MOUTH DAILY AS NEEDED (FLUID RETENTION).   levocetirizine (XYZAL) 5 MG tablet TAKE 1 TABLET BY MOUTH EVERY DAY IN THE EVENING   potassium chloride (KLOR-CON) 10 MEQ tablet TAKE 1 TABLET BY MOUTH EVERY DAY   ALPRAZolam (XANAX) 1 MG tablet Take 1 tablet (1 mg total) by mouth at bedtime as needed for anxiety or sleep.   amLODipine (NORVASC) 10 MG tablet TAKE 1 TABLET BY MOUTH EVERY DAY   b complex vitamins tablet Take 1 tablet by mouth daily.   Blood Glucose Monitoring Suppl (ACCU-CHEK GUIDE) w/Device KIT Use to test sugars daily.   Blood Pressure Monitor KIT Use to check blood pressure.   CALCIUM-VITAMIN D PO Take 1 tablet by mouth daily as needed (supplemental).    Cholecalciferol (VITAMIN D3) 20 MCG (800 UNIT) TABS Take 1 tablet by mouth daily.    Ginkgo Biloba (GNP GINGKO BILOBA EXTRACT PO) Take by mouth.   glipiZIDE (GLUCOTROL) 5 MG tablet Take 2 tablets (10 mg total) by mouth 2 (two) times daily before a meal.   glucose blood (ACCU-CHEK GUIDE) test strip USE TO TEST ONCE DAILY   [START ON 02/21/2021] HYDROcodone-acetaminophen (NORCO) 10-325 MG tablet Take 1 tablet by mouth every 6 (six) hours as needed for moderate pain.   hydroxychloroquine (PLAQUENIL) 200 MG tablet TAKE 1 TABLET BY MOUTH EVERY DAY   Lancets (ACCU-CHEK SOFT TOUCH) lancets Test once per day and diagnosis code is E  11.9 and dispense for Aviva plus   metFORMIN (GLUCOPHAGE) 500 MG tablet Take 1 tablet (500 mg total) by mouth 2 (two) times daily with a meal.   methocarbamol (ROBAXIN) 500 MG tablet Take 1 tablet (500 mg total) by mouth every 6 (six) hours as needed for muscle spasms.   Misc Natural Products (GINSENG COMPLEX PO) Take by mouth. As needed for energy   naproxen sodium (ALEVE) 220 MG tablet Take 220 mg by mouth 2 (two) times daily as needed. (Patient not taking: Reported on 11/06/2020)   Omega-3 Fatty Acids (OMEGA 3 PO) Take by mouth daily.   omeprazole (PRILOSEC) 40 MG capsule TAKE 1 CAPSULE BY MOUTH EVERY DAY   ondansetron (ZOFRAN-ODT) 4 MG disintegrating tablet TAKE 1 TABLET BY MOUTH EVERY 8 HOURS AS NEEDED FOR NAUSEA AND VOMITING   OVER THE COUNTER MEDICATION Active Liver Daily   Polyethylene Glycol 3350 (MIRALAX PO) Take by mouth as needed.   pregabalin (LYRICA) 100 MG capsule Take 1 capsule (100 mg total) by mouth at bedtime.   sitaGLIPtin (JANUVIA) 100 MG tablet Take 1 tablet (100 mg total) by mouth daily.   No facility-administered encounter medications on file as of 02/18/2021.  Fill History: atorvastatin (LIPITOR) tablet 11/17/2020 90   fluticasone (FLONASE) nasal spray 11/17/2020 90   furosemide (LASIX) tablet 11/17/2020 90   GABAPENTIN 100 MG CAPSULE 12/28/2019 90   hydroxychloroquine (PLAQUENIL) tablet 200 mg 11/24/2020 90  levocetirizine (XYZAL) tablet 5 mg 11/17/2020 90   omeprazole (PRILOSEC) capsule 12/08/2020 90   potassium chloride (K-DUR,KLOR-CON) CR tablet 11/17/2020 90   JANUVIA 100 MG PO TABS 12/09/2020 90   amLODIpine (NORVASC) tablet 12/16/2020 90   glipiZIDE (GLUCOTROL) tablet 11/24/2020 90   metFORMIN (GLUCOPHAGE) tablet 12/08/2020 90   Recent Relevant Labs: Lab Results  Component Value Date/Time   HGBA1C 11.4 (H) 12/21/2020 02:20 PM   HGBA1C 8.8 (H) 09/18/2020 01:42 PM   MICROALBUR 0.3 10/19/2013 09:44 AM   MICROALBUR 1.2 03/30/2009 03:00 PM     Kidney Function Lab Results  Component Value Date/Time   CREATININE 0.71 09/18/2020 01:42 PM   CREATININE 1.07 (H) 09/16/2019 03:11 PM   CREATININE 1.10 01/07/2017 04:50 PM   CREATININE 0.88 04/18/2016 03:35 PM   GFR 84.19 09/18/2020 01:42 PM   GFRNONAA 59 (L) 12/24/2016 02:03 PM   GFRAA >60 12/24/2016 02:03 PM    Current antihyperglycemic regimen:  Glipizide 5 mg twice daily Metformin 500 mg twice daily Januvia 100 mg daily  What recent interventions/DTPs have been made to improve glycemic control:  No recent interventions  Have there been any recent hospitalizations or ED visits since last visit with CPP? Patient denies any recent hospital visits.  Patient reports hypoglycemic symptoms, including Shaky  Patient denies hyperglycemic symptoms, including none  How often are you checking your blood sugar? Patient states she is checking her blood sugars in the morning prior to eating and from time to time later in the day.   What are your blood sugars ranging?  Fasting: recent readings, 111, 117, 97, 109 After meals: 144  During the week, how often does your blood glucose drop below 70? Patient recently had her blood sugar drop to 68, she states she had taken her medication without eating.  Once it started to drop she ate something and she felt better and blood sugar recovered quickly.  Are you checking your feet daily/regularly? Patient is checking her feet regularly.  Adherence Review: Is the patient currently on a STATIN medication? Yes Is the patient currently on ACE/ARB medication? No Does the patient have >5 day gap between last estimated fill dates? No  Care Gaps: AWV - completed on 11/06/20 Last BP - 132/80 on 12/05/2020 A1C - 11.4 on 12/21/2020 Tetanus/TDAP - never done Urine microalbumin - overdue  Foot exam - overdue  Eye exam - overdue Covid vaccine - overdue  Star Rating Drugs: Atorvastatin 34m - [facility]  Glipizide 550m-  last filled on [facility]/24/2020 [facility] Sitagliptin 10060m last [facility]09/2020 [facility] Metformin 500 mg - [facility] [place] [phone]

## 2021-03-13 ENCOUNTER — Encounter: Payer: Self-pay | Admitting: Family Medicine

## 2021-03-13 ENCOUNTER — Ambulatory Visit (INDEPENDENT_AMBULATORY_CARE_PROVIDER_SITE_OTHER): Payer: Medicare HMO | Admitting: Family Medicine

## 2021-03-13 VITALS — BP 132/82 | HR 89 | Temp 98.6°F | Wt 168.0 lb

## 2021-03-13 DIAGNOSIS — F1190 Opioid use, unspecified, uncomplicated: Secondary | ICD-10-CM

## 2021-03-13 DIAGNOSIS — G8929 Other chronic pain: Secondary | ICD-10-CM

## 2021-03-13 DIAGNOSIS — E119 Type 2 diabetes mellitus without complications: Secondary | ICD-10-CM

## 2021-03-13 DIAGNOSIS — M5440 Lumbago with sciatica, unspecified side: Secondary | ICD-10-CM

## 2021-03-13 LAB — POCT GLYCOSYLATED HEMOGLOBIN (HGB A1C): Hemoglobin A1C: 7.3 % — AB (ref 4.0–5.6)

## 2021-03-13 MED ORDER — HYDROCODONE-ACETAMINOPHEN 10-325 MG PO TABS
1.0000 | ORAL_TABLET | Freq: Four times a day (QID) | ORAL | 0 refills | Status: DC | PRN
Start: 2021-04-23 — End: 2021-03-13

## 2021-03-13 MED ORDER — HYDROCODONE-ACETAMINOPHEN 10-325 MG PO TABS: 1.0000 | ORAL_TABLET | Freq: Four times a day (QID) | ORAL | 0 refills | Status: DC | PRN

## 2021-03-13 NOTE — Progress Notes (Signed)
° °  Subjective:    Patient ID: [name], female    DOB: [birth year], 76 y.o.   MRN: [id number]  HPI Here for pain management. She is doing fairly. Her back bothers her every day, especially if she spends much time on her feet. She rates her average pain level as 3 out of 10. She is able to sleep well.    Review of Systems  Constitutional: Negative.   Respiratory: Negative.    Cardiovascular: Negative.   Musculoskeletal:  Positive for back pain.      Objective:   Physical Exam Constitutional:      Appearance: Normal appearance.  Cardiovascular:     Rate and Rhythm: Normal rate and regular rhythm.     Pulses: Normal pulses.     Heart sounds: Normal heart sounds.  Pulmonary:     Effort: Pulmonary effort is normal.     Breath sounds: Normal breath sounds.  Musculoskeletal:     Comments: She is mildly tender in the lower back with full ROM. Negative SLR on each side   Neurological:     Mental Status: She is alert.          Assessment & Plan:  Pain management. Indication for chronic opioid: low back pain Medication and dose: Norco 10-325 # pills per month: 120 Last UDS date: 09-18-20 Opioid Treatment Agreement signed (Y/N): 06-19-17 Opioid Treatment Agreement last reviewed with patient:  03-13-21 [facility] reviewed this encounter (include red flags): Yes Meds were refilled. [name], MD

## 2021-03-19 ENCOUNTER — Ambulatory Visit: Payer: Medicare HMO | Admitting: Family Medicine

## 2021-03-22 ENCOUNTER — Other Ambulatory Visit: Payer: Self-pay | Admitting: Family Medicine

## 2021-03-22 ENCOUNTER — Other Ambulatory Visit: Payer: Self-pay

## 2021-03-22 MED ORDER — AMLODIPINE BESYLATE 10 MG PO TABS: 10.0000 mg | ORAL_TABLET | Freq: Every day | ORAL | 1 refills | Status: DC

## 2021-03-22 MED ORDER — HYDROXYCHLOROQUINE SULFATE 200 MG PO TABS: 200.0000 mg | ORAL_TABLET | Freq: Every day | ORAL | 1 refills | Status: DC

## 2021-03-26 ENCOUNTER — Other Ambulatory Visit: Payer: Self-pay | Admitting: Family Medicine

## 2021-03-28 ENCOUNTER — Other Ambulatory Visit: Payer: Self-pay | Admitting: Family Medicine

## 2021-03-28 ENCOUNTER — Telehealth: Payer: Self-pay | Admitting: Family Medicine

## 2021-03-28 NOTE — Telephone Encounter (Signed)
Refill request for this medication has been sent to Dr. Sarajane Jews.

## 2021-03-28 NOTE — Telephone Encounter (Signed)
Patient called needing a refill of ondansetron (ZOFRAN-ODT) 4 MG disintegrating tablet to be sent to [id number] - [place], [place] - [address]. She says that she has two other prescriptions at the pharmacy that need to be picked up and is wanting this one sent as soon as possible in order to pick them all up together.  Please advise.

## 2021-03-28 NOTE — Telephone Encounter (Signed)
Last OV PMV- 03/13/21 Last refill-01/10/21--30 tabs, 1 refill  No future OV scheduled.   Can this patient receive a refill?

## 2021-03-29 NOTE — Telephone Encounter (Signed)
Dr. Sarajane Jews sent medication to pharmacy this am.  Advised patient in future to call 5 to 7 days before last tablets to be sure not to run out to prevent sickness.

## 2021-03-29 NOTE — Telephone Encounter (Signed)
Patient is requesting for Dr.Fry to send this in because she is really sick.  Please advise.

## 2021-04-07 ENCOUNTER — Other Ambulatory Visit: Payer: Self-pay | Admitting: Family Medicine

## 2021-04-09 ENCOUNTER — Other Ambulatory Visit: Payer: Self-pay | Admitting: Family Medicine

## 2021-04-10 DIAGNOSIS — Z1231 Encounter for screening mammogram for malignant neoplasm of breast: Secondary | ICD-10-CM | POA: Diagnosis not present

## 2021-04-10 LAB — HM MAMMOGRAPHY

## 2021-04-10 NOTE — Telephone Encounter (Signed)
Last OV-03/13/2021 Last refill- 09/18/2020--90 tabs, 1 refill  No future OV scheduled.

## 2021-04-15 ENCOUNTER — Encounter: Payer: Self-pay | Admitting: Family Medicine

## 2021-04-24 ENCOUNTER — Telehealth: Payer: Self-pay | Admitting: Pharmacist

## 2021-04-24 NOTE — Chronic Care Management (AMB) (Signed)
° ° °  Chronic Care Management Pharmacy Assistant   Name: TANITA PALINKAS  MRN: [id number] DOB: June 05, 1945  04/26/2021 APPOINTMENT REMINDER  Called [name], No answer, left message of appointment on 04/26/2021 at 11:30 via telephone visit with [name], Pharm D. Notified to have all medications, supplements, blood pressure and/or blood sugar logs available during appointment and to return call if need to reschedule.   Care Gaps: AWV - completed on 11/06/20 Last BP - 132/82 on 03/13/2021 A1C - 11.4 on 12/21/2020 Tetanus/TDAP - never done Shingrix - never done Urine microalbumin - overdue  Foot exam - overdue  Eye exam - overdue Covid vaccine - overdue   Star Rating Drugs: Atorvastatin 10mg  - [facility]06[facility]2021 [facility]  Glipizide 5mg  - last filled on 02/21/2021 [facility] Sitagliptin 100mg  - last filled 03/02/2021 [facility] Metformin 500 mg - last filled 03/09/2021 [facility]   Any gaps in medications fill history? No  Gennie Alma [facility]  Catering manager [phone]

## 2021-04-26 ENCOUNTER — Ambulatory Visit (INDEPENDENT_AMBULATORY_CARE_PROVIDER_SITE_OTHER): Payer: Medicare HMO | Admitting: Pharmacist

## 2021-04-26 DIAGNOSIS — E1140 Type 2 diabetes mellitus with diabetic neuropathy, unspecified: Secondary | ICD-10-CM

## 2021-04-26 DIAGNOSIS — I10 Essential (primary) hypertension: Secondary | ICD-10-CM

## 2021-04-26 NOTE — Progress Notes (Signed)
Chronic Care Management Pharmacy Note  04/26/2021 Name:  LINDAMARIE MACLACHLAN MRN:  [id number] DOB:  1945/12/01  Summary: A1c increased and not quite at goal < 7%  Recommendations/Changes made from today's visit: -Recommended checking blood pressure at least weekly at home -Recommended bringing BP cuff to the office to ensure accuracy  Plan: Follow up DM/BP assessment in 2 months Follow up in 4-5 months  Subjective: Wendy [name] is an 76 y.o. year old female who is a primary patient of Laurey Morale, MD.  The CCM team was consulted for assistance with disease management and care coordination needs.    Engaged with patient by telephone for follow up visit in response to provider referral for pharmacy case management and/or care coordination services.   Consent to Services:  The patient was given information about Chronic Care Management services, agreed to services, and gave verbal consent prior to initiation of services.  Please see initial visit note for detailed documentation.   Patient Care Team: Laurey Morale, MD as PCP - General Viona Gilmore, [facility] as Pharmacist (Pharmacist)  Recent office visits: 03/13/21 [name], MD: Patient presented for pain management and A1c recheck. A1c decreased to 7.3%.   12/05/20 [name], MD: Patient presented for video visit for pain management.  Prescribed metformin 500 mg BID. Refilled hydrocodone-APAP 10-361m. Referred to endocrinology.  11/06/20 [name]: Patient presented for AWV.   Recent consult visits: None  Hospital visits: None in previous 6 months  Objective:  Lab Results  Component Value Date   CREATININE 0.71 09/18/2020   BUN 12 09/18/2020   GFR 84.19 09/18/2020   GFRNONAA 59 (L) 12/24/2016   GFRAA >60 12/24/2016   NA 140 09/18/2020   K 3.9 09/18/2020   CALCIUM 9.3 09/18/2020   CO2 28 09/18/2020   GLUCOSE 138 (H) 09/18/2020    Lab Results  Component Value Date/Time   HGBA1C 7.3 (A) 03/13/2021  01:53 PM   HGBA1C 11.4 (H) 12/21/2020 02:20 PM   HGBA1C 8.8 (H) 09/18/2020 01:42 PM   GFR 84.19 09/18/2020 01:42 PM   GFR 63.15 01/07/2017 04:50 PM   MICROALBUR 0.3 10/19/2013 09:44 AM   MICROALBUR 1.2 03/30/2009 03:00 PM    Last diabetic Eye exam:  Lab Results  Component Value Date/Time   HMDIABEYEEXA No Retinopathy 05/22/2015 10:11 AM    Last diabetic Foot exam: No results found for: HMDIABFOOTEX   Lab Results  Component Value Date   CHOL 142 09/18/2020   HDL 57.20 09/18/2020   LDLCALC 72 09/18/2020   LDLDIRECT 143.6 06/05/2010   TRIG 64.0 09/18/2020   CHOLHDL 2 09/18/2020    Hepatic Function Latest Ref Rng & Units 09/18/2020 09/16/2019 12/24/2016  Total Protein 6.0 - 8.3 g/dL 6.9 6.7 7.9  Albumin 3.5 - 5.2 g/dL 3.9 - 3.7  AST 0 - 37 U/L _0 ALT 0 - 35 U/L 14 15 13(L)  Alk Phosphatase 39 - 117 U/L 66 - 69  Total Bilirubin 0.2 - 1.2 mg/dL 0.6 0.8 1.2  Bilirubin, Direct 0.0 - 0.3 mg/dL 0.1 0.2 -    Lab Results  Component Value Date/Time   TSH 1.30 09/18/2020 01:42 PM   TSH 0.80 09/16/2019 03:11 PM   FREET4 0.74 09/18/2020 01:42 PM   FREET4 0.74 08/25/2011 12:17 PM    CBC Latest Ref Rng & Units 09/18/2020 09/16/2019 12/24/2016  WBC 4.0 - 10.5 K/uL 5.5 5.3 10.3  Hemoglobin 12.0 - 15.0 g/dL 12.7 13.2 13.9  Hematocrit 36.0 - 46.0 % 38.4 40.8 42.1  Platelets 150.0 - 400.0 K/uL 197.0 205 197    No results found for: VD25OH  Clinical ASCVD: No  The ASCVD Risk score (Arnett DK, et al., 2019) failed to calculate for the following reasons:   Unable to determine if patient is Non-Hispanic African American    Depression screen [facility] 2/9 11/06/2020 11/06/2020 10/11/2019  Decreased Interest 0 0 0  Down, Depressed, Hopeless 0 0 0  PHQ - 2 Score 0 0 0  Altered sleeping - - 0  Tired, decreased energy - - 0  Change in appetite - - 0  Feeling bad or failure about yourself  - - 0  Trouble concentrating - - 0  Moving slowly or fidgety/restless - - 0  Suicidal thoughts - - 0   PHQ-9 Score - - 0  Difficult doing work/chores - - Not difficult at all      Social History   Tobacco Use  Smoking Status Some Days   Packs/day: 0.03   Years: 45.00   Pack years: 1.35   Types: Cigars, Cigarettes  Smokeless Tobacco Never  Tobacco Comments   1 black and mild every 2-3 days- no cigs - doesnt smoke the whole thing    BP Readings from Last 3 Encounters:  03/13/21 132/82  12/05/20 132/80  09/25/20 140/82   Pulse Readings from Last 3 Encounters:  03/13/21 89  12/05/20 82  09/25/20 98   Wt Readings from Last 3 Encounters:  03/13/21 168 lb (76.2 kg)  12/05/20 159 lb (72.1 kg)  09/25/20 160 lb 9.6 oz (72.8 kg)   BMI Readings from Last 3 Encounters:  03/13/21 28.84 kg/m  12/05/20 27.29 kg/m  09/25/20 27.57 kg/m    Assessment/Interventions: Review of patient past medical history, allergies, medications, health status, including review of consultants reports, laboratory and other test data, was performed as part of comprehensive evaluation and provision of chronic care management services.   SDOH:  (Social Determinants of Health) assessments and interventions performed: No   CCM Care Plan  Allergies  Allergen Reactions   Aspirin Nausea And Vomiting   Tramadol Nausea And Vomiting    Medications Reviewed Today     Reviewed by Viona Gilmore, [facility] (Pharmacist) on 04/26/21 at 1153  Med List Status: <None>   Medication Order Taking? Sig Documenting Provider Last Dose Status Informant  ACCU-CHEK GUIDE test strip [id number]  USE TO TEST ONCE DAILY Laurey Morale, MD  Active   ALPRAZolam Duanne Moron) 1 MG tablet [id number]  TAKE 1 TABLET (1 MG TOTAL) BY MOUTH AT BEDTIME AS NEEDED FOR ANXIETY OR SLEEP. Laurey Morale, MD  Active   amLODipine (NORVASC) 10 MG tablet [id number]  Take 1 tablet (10 mg total) by mouth daily. Laurey Morale, MD  Active   atorvastatin (LIPITOR) 10 MG tablet [id number]  TAKE 1 TABLET BY MOUTH EVERY DAY Laurey Morale, MD  Active   b  complex vitamins tablet [id number] No Take 1 tablet by mouth daily. [provider] Taking Active Self  Blood Glucose Monitoring Suppl (ACCU-CHEK GUIDE) w/Device KIT [id number]  Use to test sugars daily. Laurey Morale, MD  Active   Blood Pressure Monitor KIT [id number] No Use to check blood pressure. Laurey Morale, MD Taking Active   CALCIUM-VITAMIN D PO [id number] No Take 1 tablet by mouth daily as needed (supplemental).  [provider] Taking Active Self  Cholecalciferol (VITAMIN D3) 20 MCG (800 UNIT) TABS [id number] No Take  1 tablet by mouth daily.  [provider] Taking Active Self  fluticasone (FLONASE) 50 MCG/ACT nasal spray [id number]  SPRAY 2 SPRAYS INTO EACH NOSTRIL EVERY DAY Laurey Morale, MD  Active   furosemide (LASIX) 20 MG tablet [id number]  TAKE 1 TABLET (20 MG TOTAL) BY MOUTH DAILY AS NEEDED (FLUID RETENTION). Laurey Morale, MD  Active   Ginkgo Biloba (GNP GINGKO BILOBA EXTRACT PO) [id number] No Take by mouth. [provider] Taking Active   glipiZIDE (GLUCOTROL) 5 MG tablet [id number] No Take 2 tablets (10 mg total) by mouth 2 (two) times daily before a meal. Laurey Morale, MD Taking Active   HYDROcodone-acetaminophen [facility]) 10-325 MG tablet [id number]  Take 1 tablet by mouth every 6 (six) hours as needed for moderate pain. Laurey Morale, MD  Active   hydroxychloroquine (PLAQUENIL) 200 MG tablet [id number]  Take 1 tablet (200 mg total) by mouth daily. Laurey Morale, MD  Active   Lancets (ACCU-CHEK SOFT [facility]) lancets [id number] No Test once per day and diagnosis code is E 11.9 and dispense for Aviva plus Laurey Morale, MD Taking Active   levocetirizine (XYZAL) 5 MG tablet [id number]  TAKE 1 TABLET BY MOUTH EVERY DAY IN THE Billee Cashing, MD  Active   metFORMIN (GLUCOPHAGE) 500 MG tablet [id number]  Take 1 tablet (500 mg total) by mouth 2 (two) times daily with a meal. Laurey Morale, MD  Active   methocarbamol (ROBAXIN) 500 MG tablet [id number]  No Take 1 tablet (500 mg total) by mouth every 6 (six) hours as needed for muscle spasms. Laurey Morale, MD Taking Active   Misc Natural Products [facility] COMPLEX PO) [id number] No Take by mouth. As needed for energy [provider] Taking Active   naproxen sodium (ALEVE) 220 MG tablet [id number] No Take 220 mg by mouth 2 (two) times daily as needed.  Patient not taking: Reported on 11/06/2020   [provider] Not Taking Active   Omega-3 Fatty Acids (OMEGA 3 PO) [id number] No Take by mouth daily. [provider] Taking Active   omeprazole (PRILOSEC) 40 MG capsule [id number] No TAKE 1 CAPSULE BY MOUTH EVERY DAY Laurey Morale, MD Taking Active   ondansetron (ZOFRAN-ODT) 4 MG disintegrating tablet [id number]  TAKE 1 TABLET BY MOUTH EVERY 8 HOURS AS NEEDED FOR NAUSEA AND VOMITING Laurey Morale, MD  Active   OVER THE COUNTER MEDICATION [id number] No Active Liver Daily [provider] Taking Active   Polyethylene Glycol 3350 (MIRALAX PO) [id number] No Take by mouth as needed. [provider] Taking Active   potassium chloride (KLOR-CON) 10 MEQ tablet [id number]  TAKE 1 TABLET BY MOUTH EVERY DAY Laurey Morale, MD  Active   pregabalin (LYRICA) 100 MG capsule [id number]  TAKE 1 CAPSULE BY MOUTH AT BEDTIME. Laurey Morale, MD  Active   sitaGLIPtin (JANUVIA) 100 MG tablet [id number] No Take 1 tablet (100 mg total) by mouth daily. 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    Immunization History  Administered Date(s) Administered    Fluad Quad(high Dose 65+) 12/25/2018, 12/16/2019, 01/02/2021   Influenza Whole 02/08/2007, 12/01/2008   Influenza, High Dose Seasonal PF 01/07/2016, 12/17/2016, 12/21/2017   Influenza,inj,Quad PF,6+ Mos 12/21/2012, 10/26/2013   Influenza-Unspecified 12/17/2016   Moderna Covid-19 Vaccine Bivalent Booster 64yr & up 01/05/2021   Moderna SARS-COV2 Booster Vaccination 02/06/2020, 06/19/2020   Moderna Sars-Covid-2 Vaccination 05/13/2019, 06/13/2019   Pneumococcal Conjugate-13 01/07/2016   Pneumococcal Polysaccharide-23 12/17/2016   Tdap 03/06/2021   Zoster Recombinat (Shingrix) 03/06/2021   Patient reports her blood sugars have been much better lately. The highest she has seen recently was 179 and it was in the evening.  She just moved on the 21st and purchased a new house. She hasn't been cooking right now and because she isn't situated in her new house yet. She has been very active with the move. She also hasn't been checking her blood pressures because of the move as her machine is packed in a box.    Conditions to be addressed/monitored:  Hypertension, Diabetes, Anxiety, Osteoporosis, Tobacco use, Allergic Rhinitis and pain, neuropathy, edema  Conditions addressed this visit: Hypertension, diabetes  Care Plan : CCM Pharmacy Care Plan  Updates made by PViona Gilmore RNeahkahniesince 04/26/2021 12:00 AM     Problem: Problem: Hypertension, Diabetes, Anxiety, Osteoporosis, Tobacco use, Allergic Rhinitis and pain, neuropathy, edema      Long-Range Goal: Patient-Specific Goal   Start Date: 05/17/2020  Expected End Date: 05/17/2021  Recent Progress: On track  Priority: High  Note:   Current Barriers:  Unable to independently monitor therapeutic efficacy Unable to achieve control of diabetes and blood pressure  Does not adhere to prescribed medication regimen  Pharmacist Clinical Goal(s):  Patient will achieve adherence to monitoring guidelines and medication adherence to achieve  therapeutic efficacy achieve control of blood pressure as evidenced by blood pressure readings  through collaboration with PharmD and provider.   Interventions: 1:1 collaboration with FLaurey Morale MD regarding development and update of comprehensive plan of care as evidenced by provider attestation and co-signature Inter-disciplinary care team collaboration (see longitudinal plan of care) Comprehensive medication review performed; medication list updated in electronic medical record  Hypertension (BP goal <140/90) -Uncontrolled -Current treatment: Lisinopril-HCTZ 20-220m 1 tablet once daily - Appropriate, Effective, Safe, Accessible Furosemide 20 mg 1 tablet daily - Appropriate, Effective, Safe, Accessible -Medications previously tried: none  -Current home readings: could not provide readings (not sure where her cuff is) - hasn't checked in a few weeks -Current dietary habits: did not discuss -Current exercise habits: walks at work and doing arm rotations and leg rotations; using an exercise book at home from PT -Denies hypotensive/hypertensive symptoms -Educated on Exercise goal of 150 minutes per week; Importance of home blood pressure monitoring; Proper BP monitoring technique; -Counseled to monitor BP at home at least weekly, document, and provide log at future appointments -Counseled on diet and exercise extensively Recommended to continue current medication  Hyperlipidemia: (LDL goal < 70) -Not ideally controlled -Current treatment: Atorvastatin 1057m1 tablet once daily - Appropriate, Effective, Safe, Accessible -Medications previously tried: none  -Current dietary patterns: doesn't eat pork; limits eggs -Current exercise habits: walks at work and doing arm rotations and leg rotations; using an exercise book at home from PT -Educated on Cholesterol goals;  Benefits of statin for ASCVD risk reduction; Importance of limiting foods high in cholesterol; Exercise goal of 150  minutes per week; -Counseled on diet and exercise  extensively Recommended to continue current medication   Diabetes (A1c goal <7%) -Uncontrolled -Current medications: Glipizide 79m, 2 tablets twice daily before a meal - Appropriate, Query effective, Safe, Accessible Januvia 100 mg 1 tablet daily - Appropriate, Query effective, Safe, Accessible Metformin 500 mg 1 tablet twice daily - Appropriate, Query effective, Safe, Accessible -Medications previously tried: metformin (stomach upset), Januvia (low BGs)  -Current home glucose readings fasting glucose: 90s, 179 (highest it has been in the evening) - checking 2-3 times a day post prandial glucose: n/a -Reports hypoglycemic/hyperglycemic symptoms - just one event of 66 -Current meal patterns: cut down on rice and bread and pasta breakfast: ensure  lunch: PB crackers and cheese and bologna; sandwich or crackers  dinner: brussel sprouts, broccoli, green giant mixed veggies snacks: patient was eating more candy; eating more raw vegetables and fruit drinks: water; cranberry juice occasionally; more and water and no fruit juice and soda  -Current exercise: walks at work and doing arm rotations and leg rotations; using an exercise book at home from PT -Educated on A1c and blood sugar goals; Exercise goal of 150 minutes per week; Carbohydrate counting and/or plate method -Counseled to check feet daily and get yearly eye exams -Counseled on diet and exercise extensively Recommended to continue current medication  Edema (Goal: minimize swelling) -Controlled -Current treatment  Furosemide 242m 1 tablet once daily as needed for fluid retention - Appropriate, Effective, Safe, Accessible Potassium chloride 1059m 1 tablet once daily - Appropriate, Effective, Safe, Accessible  -Medications previously tried: none  -Recommended to continue current medication  Rheumatoid arthritis (Goal: minimize symptoms and manage pain) -Controlled -Current  treatment  Hydroxychloroquine 200m31m tablet once daily - Appropriate, Effective, Safe, Accessible  -Medications previously tried: none  -Recommended to continue current medication  Neuropathy (Goal: minimize pain) -Controlled -Current treatment  Gabapentin 100mg3mcapsule twice daily - Appropriate, Effective, Safe, Accessible -Medications previously tried: none  -Recommended to continue current medication  Pain (Goal: minimize pain) -Controlled -Current treatment  Hydrocodone/ APAP 10/325mg,50mablet every six hours as needed for moderate pain (takes 3-4x per day)  - Appropriate, Effective, Query Safe, Accessible Methocarbamol 500 mg 1 tablet at bedtime - Appropriate, Effective, Query Safe, Accessible -Medications previously tried: n/a  -Recommended to continue current medication Counseled on not taking hydrocodone at the same time as alprazolam due to sedation Patient backed down on use of methocarbamol due to sluggishness.  Anxiety (Goal: minimize symptoms) -Controlled -Current treatment: Alprazolam (Xanax) 1mg, 167mblet at bedtime as needed for anxiety or sleep - Appropriate, Effective, Query Safe, Accessible -Medications previously tried/failed: n/a -GAD7: n/a -Educated on Benefits of medication for symptom control Benefits of cognitive-behavioral therapy with or without medication -Recommended to continue current medication  Tobacco use (Goal quit smoking) -Controlled -Previous quit attempts: cold turkey [place] treatment  No medications -Patient smokes After 30 minutes of waking -Patient triggers include:  habit -On a scale of 1-10, reports MOTIVATION to quit is 10 -On a scale of 1-10, reports CONFIDENCE in quitting is 10 - Patient quit smoking a month ago and congratulated her on this accomplishment -Recommended to continue without smoking  Allergic rhinitis (Goal: minimize symptoms of allergies) -Controlled -Current treatment  levocetirizine 5mg, 1 51mlet  every evening  - Appropriate, Effective, Safe, Accessible  Fluticasone 50mcg/ac63msal spray, 2 sprays into each nostril every day - Appropriate, Effective, Safe, Accessible -Medications previously tried: none  -Recommended to continue current medication Counseled on avoiding allergy triggers  GERD (Goal: minimize symptoms) -Controlled -Current  treatment  Omeprazole 40 mg 1 capsule daily - Appropriate, Effective, Safe, Accessible -Medications previously tried: none  -Recommended to continue current medication   Health Maintenance -Vaccine gaps: none -Current therapy:  Vitamin B complex 1 tablet daily Ginkgo biloba 1 tablet daily Ginseng complex daily Active liver daily Vitamin D3 1000 units, 1 tablet once daily Calcium-mag-zinc daily  -Educated on Herbal supplement research is limited and benefits usually cannot be proven -Patient is satisfied with current therapy and denies issues -Educated on risk vs benefit of supplementation  Patient Goals/Self-Care Activities Patient will:  - take medications as prescribed check glucose daily, document, and provide at future appointments check blood pressure weekly, document, and provide at future appointments  Follow Up Plan: Telephone follow up appointment with care management team member scheduled for: 4-5 months      Medication Assistance: None required.  Patient affirms current coverage meets needs.  Compliance/Adherence/Medication fill history: Care Gaps: Eye exam, urine microalbumin, foot exam Last BP - 132/82 on 03/13/2021 A1C - 7.3 03/13/2021  Star-Rating Drugs: Atorvastatin 26m - last [facility]  Glipizide 528m- last filled on [facility]/21/2021 [facility] Sitagliptin 10041m last filled 03/02/2021 [facility] at [facility] Metformin 500 mg - last filled [facility]/09/2021 [facility]  [facility]  [id number] [place] [id number]: [phone]: [phone] pill box? No - reports medications in separate bags. Has own management system.Reports never forgetting to take it.   Pt endorses 100% compliance  We discussed: Current pharmacy is preferred with insurance plan and patient is satisfied with pharmacy services Patient decided to: Continue current medication management strategy  Care Plan and Follow Up Patient Decision:  Patient agrees to Care Plan and Follow-up.  Plan: Telephone follow up appointment with care management team member scheduled for:  4 months  MadeJeni SallesarmD BCACChautauquarmacist LeBaBajandasBrasOwl Ranch-[phone]

## 2021-04-30 DIAGNOSIS — E1140 Type 2 diabetes mellitus with diabetic neuropathy, unspecified: Secondary | ICD-10-CM | POA: Diagnosis not present

## 2021-04-30 DIAGNOSIS — I10 Essential (primary) hypertension: Secondary | ICD-10-CM

## 2021-06-05 ENCOUNTER — Encounter: Payer: Self-pay | Admitting: Family Medicine

## 2021-06-05 ENCOUNTER — Ambulatory Visit (INDEPENDENT_AMBULATORY_CARE_PROVIDER_SITE_OTHER): Payer: Medicare HMO | Admitting: Family Medicine

## 2021-06-05 VITALS — BP 130/82 | Temp 99.0°F | Wt 166.1 lb

## 2021-06-05 DIAGNOSIS — G8929 Other chronic pain: Secondary | ICD-10-CM

## 2021-06-05 DIAGNOSIS — M5440 Lumbago with sciatica, unspecified side: Secondary | ICD-10-CM

## 2021-06-05 DIAGNOSIS — F1190 Opioid use, unspecified, uncomplicated: Secondary | ICD-10-CM

## 2021-06-05 MED ORDER — HYDROCODONE-ACETAMINOPHEN 10-325 MG PO TABS
1.0000 | ORAL_TABLET | Freq: Four times a day (QID) | ORAL | 0 refills | Status: DC | PRN
Start: 2021-07-20 — End: 2021-06-05

## 2021-06-05 MED ORDER — ONDANSETRON 4 MG PO TBDP: ORAL_TABLET | ORAL | 5 refills | Status: DC

## 2021-06-05 MED ORDER — PREGABALIN 100 MG PO CAPS
100.0000 mg | ORAL_CAPSULE | Freq: Every day | ORAL | 3 refills | Status: DC
Start: 2021-06-05 — End: 2021-12-16

## 2021-06-05 MED ORDER — HYDROCODONE-ACETAMINOPHEN 10-325 MG PO TABS: 1.0000 | ORAL_TABLET | Freq: Four times a day (QID) | ORAL | 0 refills | Status: AC | PRN

## 2021-06-05 MED ORDER — HYDROCODONE-ACETAMINOPHEN 10-325 MG PO TABS
1.0000 | ORAL_TABLET | Freq: Four times a day (QID) | ORAL | 0 refills | Status: DC | PRN
Start: 2021-06-20 — End: 2021-06-05

## 2021-06-05 NOTE — Progress Notes (Signed)
? ?  Subjective:  ? ? Patient ID: Wendy [name], female    DOB: [birth year], 76 y.o.   MRN: [id number] ? ?HPI ?Here for pain management. She is doing well. Last time we added Pregabalin to her regimen, and she says this has helped her pain quite a bit.  ? ? ?Review of Systems  ?Constitutional: Negative.   ?HENT: Negative.    ?Eyes: Negative.   ?Respiratory: Negative.    ?Cardiovascular: Negative.   ?Gastrointestinal: Negative.   ?Genitourinary:  Negative for decreased urine volume, difficulty urinating, dyspareunia, dysuria, enuresis, flank pain, frequency, hematuria, pelvic pain and urgency.  ?Musculoskeletal:  Positive for back pain.  ?Skin: Negative.   ?Neurological: Negative.  Negative for headaches.  ?Psychiatric/Behavioral: Negative.    ? ?   ?Objective:  ? Physical Exam ?Constitutional:   ?   Appearance: Normal appearance.  ?Neurological:  ?   Mental Status: She is alert.  ? ? ? ? ? ?   ?Assessment & Plan:  ?Pain management. ?Indication for chronic opioid: low back pain ?Medication and dose: Norco 10-325 ?# pills per month: 120 ?Last UDS date: 09-18-20 ?Opioid Treatment Agreement signed (Y/N): 06-19-17 ?Opioid Treatment Agreement last reviewed with patient:  06-05-21 ?NCCSRS reviewed this encounter (include red flags): Yes ?Meds were refilled.  ?[name], MD ? ? ?

## 2021-06-08 ENCOUNTER — Other Ambulatory Visit: Payer: Self-pay | Admitting: Family Medicine

## 2021-06-12 ENCOUNTER — Telehealth: Payer: Self-pay | Admitting: Pharmacist

## 2021-06-12 NOTE — Chronic Care Management (AMB) (Signed)
? ? ?Chronic Care Management ?Pharmacy Assistant  ? ?Name: Wendy [name]  MRN: [id number] DOB: 01/14/1946 ? ?Reason for Encounter: Disease State / Hypertension and Diabetes Assessment Calls ?  ?Conditions to be addressed/monitored: ?HTN and DMII ? ?Recent office visits:  ?06/05/2021 [name] MD - Patient was seen for chronic narcotic use and additional issues. Changed Norco 10/325 to 1 tablet every 6 hours as needed. No follow up needed.  ? ?Recent consult visits:  ?None ? ?Hospital visits:  ?None ? ?Medications: ?Outpatient Encounter Medications as of 06/12/2021  ?Medication Sig  ? ACCU-CHEK GUIDE test strip USE TO TEST ONCE DAILY  ? ALPRAZolam (XANAX) 1 MG tablet TAKE 1 TABLET (1 MG TOTAL) BY MOUTH AT BEDTIME AS NEEDED FOR ANXIETY OR SLEEP.  ? amLODipine (NORVASC) 10 MG tablet Take 1 tablet (10 mg total) by mouth daily.  ? atorvastatin (LIPITOR) 10 MG tablet TAKE 1 TABLET BY MOUTH EVERY DAY  ? b complex vitamins tablet Take 1 tablet by mouth daily.  ? Blood Glucose Monitoring Suppl (ACCU-CHEK GUIDE) w/Device KIT Use to test sugars daily.  ? Blood Pressure Monitor KIT Use to check blood pressure.  ? CALCIUM-VITAMIN D PO Take 1 tablet by mouth daily as needed (supplemental).   ? Cholecalciferol (VITAMIN D3) 20 MCG (800 UNIT) TABS Take 1 tablet by mouth daily.   ? fluticasone (FLONASE) 50 MCG/ACT nasal spray SPRAY 2 SPRAYS INTO EACH NOSTRIL EVERY DAY  ? furosemide (LASIX) 20 MG tablet TAKE 1 TABLET (20 MG TOTAL) BY MOUTH DAILY AS NEEDED (FLUID RETENTION).  ? Ginkgo Biloba (GNP GINGKO BILOBA EXTRACT PO) Take by mouth.  ? glipiZIDE (GLUCOTROL) 5 MG tablet TAKE 1 TABLET (5 MG TOTAL) BY MOUTH 2 (TWO) TIMES DAILY BEFORE A MEAL.  ? [START ON 08/20/2021] HYDROcodone-acetaminophen (NORCO) 10-325 MG tablet Take 1 tablet by mouth every 6 (six) hours as needed for moderate pain.  ? hydroxychloroquine (PLAQUENIL) 200 MG tablet Take 1 tablet (200 mg total) by mouth daily.  ? JANUVIA 100 MG tablet TAKE 1 TABLET BY MOUTH EVERY  DAY  ? Lancets (ACCU-CHEK SOFT TOUCH) lancets Test once per day and diagnosis code is E 11.9 and dispense for Aviva plus  ? levocetirizine (XYZAL) 5 MG tablet TAKE 1 TABLET BY MOUTH EVERY DAY IN THE EVENING  ? metFORMIN (GLUCOPHAGE) 500 MG tablet Take 1 tablet (500 mg total) by mouth 2 (two) times daily with a meal.  ? methocarbamol (ROBAXIN) 500 MG tablet Take 1 tablet (500 mg total) by mouth every 6 (six) hours as needed for muscle spasms.  ? Misc Natural Products (GINSENG COMPLEX PO) Take by mouth. As needed for energy  ? naproxen sodium (ALEVE) 220 MG tablet Take 220 mg by mouth 2 (two) times daily as needed.  ? Omega-3 Fatty Acids (OMEGA 3 PO) Take by mouth daily.  ? omeprazole (PRILOSEC) 40 MG capsule TAKE 1 CAPSULE BY MOUTH EVERY DAY  ? ondansetron (ZOFRAN-ODT) 4 MG disintegrating tablet TAKE 1 TABLET BY MOUTH EVERY 8 HOURS AS NEEDED FOR NAUSEA AND VOMITING  ? OVER THE COUNTER MEDICATION Active Liver Daily  ? Polyethylene Glycol 3350 (MIRALAX PO) Take by mouth as needed.  ? potassium chloride (KLOR-CON) 10 MEQ tablet TAKE 1 TABLET BY MOUTH EVERY DAY  ? pregabalin (LYRICA) 100 MG capsule Take 1 capsule (100 mg total) by mouth at bedtime.  ? ?No facility-administered encounter medications on file as of 06/12/2021.  ?Fill History: ?ALPRAZOLAM 1 MG TABLET 05/08/2021 30  ? ?ATORVASTATIN 10 MG  TABLET 05/08/2021 90  ? ?FLUTICASONE PROP 50 MCG SPRAY 05/08/2021 90  ? ?FUROSEMIDE 20 MG TABLET 05/07/2021 90  ? ?HYDROCODONE-ACETAMIN 10-325 MG 05/23/2021 30  ? ?HYDROXYCHLOROQUINE 200 MG TAB 05/13/2021 90  ? ?LEVOCETIRIZINE 5 MG TABLET 05/08/2021 90  ? ?omeprazole 40 mg capsule,delayed release 06/04/2021 90  ? ?ONDANSETRON ODT 4 MG TABLET 05/22/2021 10  ? ?POTASSIUM CL ER 10 MEQ TABLET 05/08/2021 90  ? ?PREGABALIN 100 MG CAPSULE 04/08/2021 30  ? ?Januvia 100 mg tablet 06/04/2021 90  ? ?amlodipine 10 mg tablet 05/27/2021 90  ? ?RESTASIS 0.05% EYE EMULSION 03/16/2021 90  ? ?GLIPIZIDE 5 MG TABLET 05/20/2021 90  ? ?metformin  500 mg tablet 06/04/2021 90  ? ?Reviewed chart prior to disease state call. Spoke with patient regarding BP ? ?Recent Office Vitals: ?BP Readings from Last 3 Encounters:  ?06/05/21 130/82  ?03/13/21 132/82  ?12/05/20 132/80  ? ?Pulse Readings from Last 3 Encounters:  ?03/13/21 89  ?12/05/20 82  ?09/25/20 98  ?  ?Wt Readings from Last 3 Encounters:  ?06/05/21 166 lb 2 oz (75.4 kg)  ?03/13/21 168 lb (76.2 kg)  ?12/05/20 159 lb (72.1 kg)  ?  ? ?Kidney Function ?Lab Results  ?Component Value Date/Time  ? CREATININE 0.71 09/18/2020 01:42 PM  ? CREATININE 1.07 (H) 09/16/2019 03:11 PM  ? CREATININE 1.10 01/07/2017 04:50 PM  ? CREATININE 0.88 04/18/2016 03:35 PM  ? GFR 84.19 09/18/2020 01:42 PM  ? GFRNONAA 59 (L) 12/24/2016 02:03 PM  ? GFRAA >60 12/24/2016 02:03 PM  ? ? ? ?  Latest Ref Rng & Units 09/18/2020  ?  1:42 PM 09/16/2019  ?  3:11 PM 01/07/2017  ?  4:50 PM  ?BMP  ?Glucose 70 - 99 mg/dL 138   99   114    ?BUN 6 - 23 mg/dL _0 ?Creatinine 0.40 - 1.20 mg/dL 0.71   1.07   1.10    ?BUN/Creat Ratio 6 - 22 (calc)  12     ?Sodium 135 - 145 mEq/L 140   142   139    ?Potassium 3.5 - 5.1 mEq/L 3.9   4.0   4.3    ?Chloride 96 - 112 mEq/L 105   103   103    ?CO2 19 - 32 mEq/L _1 ?Calcium 8.4 - 10.5 mg/dL 9.3   9.5   9.7    ? ? ?Recent Relevant Labs: ?Lab Results  ?Component Value Date/Time  ? HGBA1C 7.3 (A) 03/13/2021 01:53 PM  ? HGBA1C 11.4 (H) 12/21/2020 02:20 PM  ? HGBA1C 8.8 (H) 09/18/2020 01:42 PM  ? MICROALBUR 0.3 10/19/2013 09:44 AM  ? MICROALBUR 1.2 03/30/2009 03:00 PM  ?  ?Kidney Function ?Lab Results  ?Component Value Date/Time  ? CREATININE 0.71 09/18/2020 01:42 PM  ? CREATININE 1.07 (H) 09/16/2019 03:11 PM  ? CREATININE 1.10 01/07/2017 04:50 PM  ? CREATININE 0.88 04/18/2016 03:35 PM  ? GFR 84.19 09/18/2020 01:42 PM  ? GFRNONAA 59 (L) 12/24/2016 02:03 PM  ? GFRAA >60 12/24/2016 02:03 PM  ? ? ?Current antihyperglycemic regimen:  ?Glipizide 5 mg 1 tablet twice daily ?Januvia 100 mg  daily ?Metformin 500 mg 1 tablet twice daily ?What recent interventions/DTPs have been made to improve glycemic control: No recent interventions made ?Have there been any recent hospitalizations or ED visits since last visit with CPP? No recent hospital visits.  ?Patient hypoglycemic symptoms ?Patient  hyperglycemic symptoms ?How often are you checking your blood sugar?  ?What are your blood sugars ranging?  ?Fasting:  ?Before meals:  ?After meals:  ?Bedtime:  ?During the week, how often does your blood glucose drop below 70?  ?Are you checking your feet daily/regularly?  ? ?Adherence Review: ?Is the patient currently on a STATIN medication? Yes ?Is the patient currently on ACE/ARB medication? Yes ?Does the patient have >5 day gap between last estimated fill dates? No ? ?Current antihypertensive regimen:  ?Amlodipine 10 mg daily ?How often are you checking your Blood Pressure?  ?Current home BP readings:  ?What recent interventions/DTPs have been made by any provider to improve Blood Pressure control since last CPP Visit: No recent interventions ?Any recent hospitalizations or ED visits since last visit with CPP? No recent hospital visits.  ?What diet changes have been made to improve Blood Pressure Control?  ?Patient follows ?Breakfast - patient will have ?Lunch - patient will have ?Dinner - patient will have ?What exercise is being done to improve your Blood Pressure Control?  ?Adherence Review: ?Is the patient currently on ACE/ARB medication? Yes ?Does the patient have >5 day gap between last estimated fill dates? No ? ?Unable to reach patient after several attempts.  ? ?Care Gaps: ?AWV - completed on 11/06/20 ?Last BP - 130/82 on 06/05/2021 ?A1C - 11.4 on 12/21/2020 ?Shingrix - never done ?Malb - overdue  ?Foot exam - overdue  ?Eye exam - overdue ?  ?Star Rating Drugs: ?Atorvastatin 47m - last filled on 05/08/2021 90[facility] at [facility]  ?Glipizide 569m- last filled on 05/20/2021 90[facility] at [facility] ?Sitagliptin 10044m last filled  06/04/2021 [facility] at [facility] ?Metformin 500 mg - last filled 06/04/2021 90 DS at [facility] ? ?JanGennie AlmaA  ?Clinical Pharmacist Assistant ?[phone]?

## 2021-08-15 DIAGNOSIS — Z111 Encounter for screening for respiratory tuberculosis: Secondary | ICD-10-CM | POA: Diagnosis not present

## 2021-08-25 ENCOUNTER — Other Ambulatory Visit: Payer: Self-pay | Admitting: Family Medicine

## 2021-08-25 DIAGNOSIS — E1140 Type 2 diabetes mellitus with diabetic neuropathy, unspecified: Secondary | ICD-10-CM

## 2021-09-05 ENCOUNTER — Telehealth: Payer: Self-pay | Admitting: Pharmacist

## 2021-09-05 NOTE — Progress Notes (Unsigned)
Chronic Care Management Pharmacy Note  09/06/2021 Name:  Wendy [name] MRN:  [id number] DOB:  [birth year]  Summary: A1c increased and not quite at goal < 7%  Recommendations/Changes made from today's visit: -Recommended checking blood pressure at least weekly at home -Recommended bringing BP cuff to the office to ensure accuracy -Recommended scheduling CPE after July 19th  Plan: Follow up DM/BP assessment in 2 months Follow up in 4-5 months   Subjective: Wendy [name] is an 76 y.o. year old female who is a primary patient of Laurey Morale, MD.  The CCM team was consulted for assistance with disease management and care coordination needs.    Engaged with patient by telephone for follow up visit in response to provider referral for pharmacy case management and/or care coordination services.   Consent to Services:  The patient was given information about Chronic Care Management services, agreed to services, and gave verbal consent prior to initiation of services.  Please see initial visit note for detailed documentation.   Patient Care Team: Laurey Morale, MD as PCP - General Viona Gilmore, [facility] as Pharmacist (Pharmacist)  Recent office visits: 06/05/21 [name], MD: Patient presented for pain management visit. Refilled Norco.  03/13/21 [name], MD: Patient presented for pain management and A1c recheck. A1c decreased to 7.3%.   12/05/20 [name], MD: Patient presented for video visit for pain management.  Prescribed metformin 500 mg BID. Refilled hydrocodone-APAP 10-366m. Referred to endocrinology.  11/06/20 [name]: Patient presented for AWV.   Recent consult visits: None  Hospital visits: None in previous 6 months  Objective:  Lab Results  Component Value Date   CREATININE 0.71 09/18/2020   BUN 12 09/18/2020   GFR 84.19 09/18/2020   GFRNONAA 59 (L) 12/24/2016   GFRAA >60 12/24/2016   NA 140 09/18/2020   K 3.9 09/18/2020   CALCIUM 9.3  09/18/2020   CO2 28 09/18/2020   GLUCOSE 138 (H) 09/18/2020    Lab Results  Component Value Date/Time   HGBA1C 7.3 (A) 03/13/2021 01:53 PM   HGBA1C 11.4 (H) 12/21/2020 02:20 PM   HGBA1C 8.8 (H) 09/18/2020 01:42 PM   GFR 84.19 09/18/2020 01:42 PM   GFR 63.15 01/07/2017 04:50 PM   MICROALBUR 0.3 10/19/2013 09:44 AM   MICROALBUR 1.2 03/30/2009 03:00 PM    Last diabetic Eye exam:  Lab Results  Component Value Date/Time   HMDIABEYEEXA No Retinopathy 05/22/2015 10:11 AM    Last diabetic Foot exam: No results found for: "HMDIABFOOTEX"   Lab Results  Component Value Date   CHOL 142 09/18/2020   HDL 57.20 09/18/2020   LDLCALC 72 09/18/2020   LDLDIRECT 143.6 06/05/2010   TRIG 64.0 09/18/2020   CHOLHDL 2 09/18/2020       Latest Ref Rng & Units 09/18/2020    1:42 PM 09/16/2019    3:11 PM 12/24/2016    2:03 PM  Hepatic Function  Total Protein 6.0 - 8.3 g/dL 6.9  6.7  7.9   Albumin 3.5 - 5.2 g/dL 3.9   3.7   AST 0 - 37 U/L 15  17  20    ALT 0 - 35 U/L 14  15  13    Alk Phosphatase 39 - 117 U/L 66   69   Total Bilirubin 0.2 - 1.2 mg/dL 0.6  0.8  1.2   Bilirubin, Direct 0.0 - 0.3 mg/dL 0.1  0.2      Lab Results  Component Value Date/Time  TSH 1.30 09/18/2020 01:42 PM   TSH 0.80 09/16/2019 03:11 PM   FREET4 0.74 09/18/2020 01:42 PM   FREET4 0.74 08/25/2011 12:17 PM       Latest Ref Rng & Units 09/18/2020    1:42 PM 09/16/2019    3:11 PM 12/24/2016    2:03 PM  CBC  WBC 4.0 - 10.5 K/uL 5.5  5.3  10.3   Hemoglobin 12.0 - 15.0 g/dL 12.7  13.2  13.9   Hematocrit 36.0 - 46.0 % 38.4  40.8  42.1   Platelets 150.0 - 400.0 K/uL 197.0  205  197     No results found for: "VD25OH"  Clinical ASCVD: No  The ASCVD Risk score (Arnett DK, et al., 2019) failed to calculate for the following reasons:   Unable to determine if patient is Non-Hispanic African American       06/05/2021    1:49 PM 11/06/2020    3:25 PM 11/06/2020    3:22 PM  Depression screen PHQ 2/9  Decreased Interest 0 0  0  Down, Depressed, Hopeless 0 0 0  PHQ - 2 Score 0 0 0  Altered sleeping 0    Tired, decreased energy 0    Change in appetite 0    Feeling bad or failure about yourself  0    Trouble concentrating 0    Moving slowly or fidgety/restless 0    Suicidal thoughts 0    PHQ-9 Score 0    Difficult doing work/chores Not difficult at all        Social History   Tobacco Use  Smoking Status Some Days   Packs/day: 0.03   Years: 45.00   Total pack years: 1.35   Types: Cigars, Cigarettes  Smokeless Tobacco Never  Tobacco Comments   1 black and mild every 2-3 days- no cigs - doesnt smoke the whole thing    BP Readings from Last 3 Encounters:  06/05/21 130/82  03/13/21 132/82  12/05/20 132/80   Pulse Readings from Last 3 Encounters:  03/13/21 89  12/05/20 82  09/25/20 98   Wt Readings from Last 3 Encounters:  06/05/21 166 lb 2 oz (75.4 kg)  03/13/21 168 lb (76.2 kg)  12/05/20 159 lb (72.1 kg)   BMI Readings from Last 3 Encounters:  06/05/21 28.52 kg/m  03/13/21 28.84 kg/m  12/05/20 27.29 kg/m    Assessment/Interventions: Review of patient past medical history, allergies, medications, health status, including review of consultants reports, laboratory and other test data, was performed as part of comprehensive evaluation and provision of chronic care management services.   SDOH:  (Social Determinants of Health) assessments and interventions performed: No   CCM Care Plan  Allergies  Allergen Reactions   Aspirin Nausea And Vomiting   Tramadol Nausea And Vomiting    Medications Reviewed Today     Reviewed by Laurey Morale, MD (Physician) on 06/05/21 at 1356  Med List Status: <None>   Medication Order Taking? Sig Documenting Provider Last Dose Status Informant  ACCU-CHEK GUIDE test strip [id number] Yes USE TO TEST ONCE DAILY Laurey Morale, MD Taking Active   ALPRAZolam Duanne Moron) 1 MG tablet [id number] Yes TAKE 1 TABLET (1 MG TOTAL) BY MOUTH AT BEDTIME AS NEEDED FOR  ANXIETY OR SLEEP. Laurey Morale, MD Taking Active   amLODipine (NORVASC) 10 MG tablet [id number] Yes Take 1 tablet (10 mg total) by mouth daily. Laurey Morale, MD Taking Active   atorvastatin (LIPITOR) 10 MG tablet [id number] Yes TAKE  1 TABLET BY MOUTH EVERY DAY Laurey Morale, MD Taking Active   b complex vitamins tablet [id number] Yes Take 1 tablet by mouth daily. [provider] Taking Active Self  Blood Glucose Monitoring Suppl (ACCU-CHEK GUIDE) w/Device KIT [id number] Yes Use to test sugars daily. Laurey Morale, MD Taking Active   Blood Pressure Monitor KIT [id number] Yes Use to check blood pressure. Laurey Morale, MD Taking Active   CALCIUM-VITAMIN D PO [id number] Yes Take 1 tablet by mouth daily as needed (supplemental).  [provider] Taking Active Self  Cholecalciferol (VITAMIN D3) 20 MCG (800 UNIT) TABS [id number] Yes Take 1 tablet by mouth daily.  [provider] Taking Active Self  fluticasone (FLONASE) 50 MCG/ACT nasal spray [id number] Yes SPRAY 2 SPRAYS INTO EACH NOSTRIL EVERY DAY Laurey Morale, MD Taking Active   furosemide (LASIX) 20 MG tablet [id number] Yes TAKE 1 TABLET (20 MG TOTAL) BY MOUTH DAILY AS NEEDED (FLUID RETENTION). Laurey Morale, MD Taking Active   Ginkgo Biloba (GNP GINGKO BILOBA EXTRACT PO) [id number] Yes Take by mouth. [provider] Taking Active   glipiZIDE (GLUCOTROL) 5 MG tablet [id number] Yes Take 2 tablets (10 mg total) by mouth 2 (two) times daily before a meal. Laurey Morale, MD Taking Active   HYDROcodone-acetaminophen [facility]) 10-325 MG tablet [id number] Yes Take 1 tablet by mouth every 6 (six) hours as needed for moderate pain. Laurey Morale, MD Taking Active   hydroxychloroquine (PLAQUENIL) 200 MG tablet [id number] Yes Take 1 tablet (200 mg total) by mouth daily. Laurey Morale, MD Taking Active   Lancets (ACCU-CHEK SOFT [facility]) lancets [id number] Yes Test once per day and diagnosis code is E 11.9 and dispense for Aviva plus  Laurey Morale, MD Taking Active   levocetirizine (XYZAL) 5 MG tablet [id number] Yes TAKE 1 TABLET BY MOUTH EVERY DAY IN THE Billee Cashing, MD Taking Active   metFORMIN (GLUCOPHAGE) 500 MG tablet [id number] Yes Take 1 tablet (500 mg total) by mouth 2 (two) times daily with a meal. Laurey Morale, MD Taking Active   methocarbamol (ROBAXIN) 500 MG tablet [id number] Yes Take 1 tablet (500 mg total) by mouth every 6 (six) hours as needed for muscle spasms. Laurey Morale, MD Taking Active   Misc Natural Products [facility] COMPLEX PO) [id number] Yes Take by mouth. As needed for energy [provider] Taking Active   naproxen sodium (ALEVE) 220 MG tablet [id number] Yes Take 220 mg by mouth 2 (two) times daily as needed. [provider] Taking Active   Omega-3 Fatty Acids (OMEGA 3 PO) [id number] Yes Take by mouth daily. [provider] Taking Active   omeprazole (PRILOSEC) 40 MG capsule [id number] Yes TAKE 1 CAPSULE BY MOUTH EVERY DAY Laurey Morale, MD Taking Active   ondansetron (ZOFRAN-ODT) 4 MG disintegrating tablet [id number] Yes TAKE 1 TABLET BY MOUTH EVERY 8 HOURS AS NEEDED FOR NAUSEA AND VOMITING Laurey Morale, MD Taking Active   OVER THE COUNTER MEDICATION [id number] Yes Active Liver Daily [provider] Taking Active   Polyethylene Glycol 3350 (MIRALAX PO) [id number] Yes Take by mouth as needed. [provider] Taking Active   potassium chloride (KLOR-CON) 10 MEQ tablet [id number] Yes TAKE 1 TABLET BY MOUTH EVERY DAY Laurey Morale, MD Taking Active   pregabalin (LYRICA) 100 MG capsule [id number] Yes TAKE 1 CAPSULE BY MOUTH AT BEDTIME. Laurey Morale, MD Taking Active   sitaGLIPtin [facility]) 100 MG tablet [id number] Yes Take  1 tablet (100 mg total) by mouth daily. 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    Immunization History  Administered Date(s) Administered   Fluad Quad(high Dose 65+) 12/25/2018, 12/16/2019, 01/02/2021   Influenza Whole 02/08/2007, 12/01/2008   Influenza, High Dose Seasonal PF 01/07/2016, 12/17/2016, 12/21/2017   Influenza,inj,Quad PF,6+ Mos 12/21/2012, 10/26/2013   Influenza-Unspecified 12/17/2016   Moderna Covid-19 Vaccine Bivalent Booster 28yr & up 01/05/2021   Moderna SARS-COV2 Booster Vaccination 02/06/2020, 06/19/2020   Moderna Sars-Covid-2 Vaccination 05/13/2019, 06/13/2019   Pneumococcal Conjugate-13 01/07/2016   Pneumococcal Polysaccharide-23 12/17/2016   Tdap 03/06/2021   Zoster Recombinat (Shingrix) 03/06/2021, 05/06/2021    Conditions to be addressed/monitored:  Hypertension, Diabetes, Anxiety, Osteoporosis, Tobacco use, Allergic Rhinitis and pain, neuropathy, edema  Conditions addressed this visit: Hypertension, diabetes  Care Plan : CCM Pharmacy Care Plan  Updates made by PViona Gilmore RRochellesince 09/06/2021 12:00 AM     Problem: Problem: Hypertension, Diabetes, Anxiety, Osteoporosis, Tobacco use, Allergic Rhinitis and pain, neuropathy, edema      Long-Range Goal: Patient-Specific Goal   Start Date: 05/17/2020  Expected End Date: 05/17/2021  Recent Progress: On track  Priority: High  Note:   Current Barriers:  Unable to independently monitor therapeutic efficacy Unable to achieve control of diabetes and blood pressure  Does not adhere to prescribed medication regimen  Pharmacist Clinical Goal(s):  Patient will achieve adherence to monitoring guidelines and medication adherence to achieve therapeutic efficacy achieve control of blood pressure  as evidenced by blood pressure readings  through collaboration with PharmD and provider.   Interventions: 1:1 collaboration with FLaurey Morale MD regarding development and update of comprehensive plan of care as evidenced by provider attestation and co-signature Inter-disciplinary care team collaboration (see longitudinal plan of care) Comprehensive medication review performed; medication list updated in electronic medical record  Hypertension (BP goal <140/90) -Uncontrolled -Current treatment: Lisinopril-HCTZ 20-266m 1 tablet once daily - Appropriate, Effective, Safe, Accessible Furosemide 20 mg 1 tablet daily - Appropriate, Effective, Safe, Accessible -Medications previously tried: none  -Current home readings: 117/77, 113/79, 120/88 (checking almost every evening) -Current dietary habits: did not discuss -Current exercise habits: walks at work and doing arm rotations and leg rotations; using an exercise book at home from PT -Denies hypotensive/hypertensive symptoms -Educated on Exercise goal of 150 minutes per week; Importance of home blood pressure monitoring; Proper BP monitoring technique; -Counseled to monitor BP at home at least weekly, document, and provide log at future appointments -Counseled on diet and exercise extensively Recommended to continue current medication  Hyperlipidemia: (LDL goal < 70) -Not ideally controlled -Current treatment: Atorvastatin 1048m1 tablet once daily - Appropriate, Effective, Safe, Accessible -Medications previously tried: none  -Current dietary patterns: doesn't eat pork; limits eggs -Current exercise habits: walks at work and doing arm rotations and leg rotations; using an exercise book at home from PT -Educated on Cholesterol goals;  Benefits of statin for ASCVD risk reduction; Importance of limiting foods high in cholesterol; Exercise goal of 150 minutes per  week; -Counseled on diet and exercise extensively Recommended to continue  current medication   Diabetes (A1c goal <7%) -Uncontrolled -Current medications: Glipizide 66m, 2 tablets twice daily before a meal - Appropriate, Query effective, Safe, Accessible Januvia 100 mg 1 tablet daily - Appropriate, Query effective, Safe, Accessible Metformin 500 mg 1 tablet twice daily - Appropriate, Query effective, Safe, Accessible -Medications previously tried: metformin (stomach upset), Januvia (low BGs)  -Current home glucose readings fasting glucose: 123, 112, 170 (highest it has been in the evening) - checking 2-3 times a day (morning and evening) post prandial glucose: n/a -Reports hypoglycemic/hyperglycemic symptoms - just one event of 66 because she didn't eat -Current meal patterns: cut down on rice and bread and pasta breakfast: ensure  lunch: PB crackers and cheese and bologna; sandwich or crackers  dinner: brussel sprouts, broccoli, green giant mixed veggies snacks: patient was eating more candy; eating more raw vegetables and fruit drinks: water; cranberry juice occasionally; more and water and no fruit juice and soda  -Current exercise: walks at work and doing arm rotations and leg rotations; using an exercise book at home from PT -Educated on A1c and blood sugar goals; Exercise goal of 150 minutes per week; Carbohydrate counting and/or plate method -Counseled to check feet daily and get yearly eye exams -Counseled on diet and exercise extensively Recommended to continue current medication  Edema (Goal: minimize swelling) -Controlled -Current treatment  Furosemide 257m 1 tablet once daily as needed for fluid retention - Appropriate, Effective, Safe, Accessible Potassium chloride 108m 1 tablet once daily - Appropriate, Effective, Safe, Accessible  -Medications previously tried: none  -Recommended to continue current medication  Rheumatoid arthritis (Goal: minimize symptoms and manage pain) -Controlled -Current treatment  Hydroxychloroquine 200m48m  tablet once daily - Appropriate, Effective, Safe, Accessible  -Medications previously tried: none  -Recommended to continue current medication  Neuropathy (Goal: minimize pain) -Controlled -Current treatment  Gabapentin 100mg45mcapsule twice daily - Appropriate, Effective, Safe, Accessible -Medications previously tried: none  -Recommended to continue current medication  Pain (Goal: minimize pain) -Controlled -Current treatment  Hydrocodone/ APAP 10/325mg,61mablet every six hours as needed for moderate pain (takes 3-4x per day)  - Appropriate, Effective, Query Safe, Accessible Methocarbamol 500 mg 1 tablet at bedtime - Appropriate, Effective, Query Safe, Accessible -Medications previously tried: n/a  -Recommended to continue current medication Counseled on not taking hydrocodone at the same time as alprazolam due to sedation Patient backed down on use of methocarbamol due to sluggishness.  Anxiety (Goal: minimize symptoms) -Controlled -Current treatment: Alprazolam (Xanax) 1mg, 123mblet at bedtime as needed for anxiety or sleep - Appropriate, Effective, Query Safe, Accessible -Medications previously tried/failed: n/a -GAD7: n/a -Educated on Benefits of medication for symptom control Benefits of cognitive-behavioral therapy with or without medication -Recommended to continue current medication  Tobacco use (Goal quit smoking) -Controlled -Previous quit attempts: cold turkey [place] treatment  No medications -Patient smokes After 30 minutes of waking -Patient triggers include:  habit -On a scale of 1-10, reports MOTIVATION to quit is 10 -On a scale of 1-10, reports CONFIDENCE in quitting is 10 - Patient quit smoking a month ago and congratulated her on this accomplishment -Recommended to continue without smoking  Allergic rhinitis (Goal: minimize symptoms of allergies) -Controlled -Current treatment  levocetirizine 5mg, 1 65mlet every evening  - Appropriate, Effective,  Safe, Accessible  Fluticasone 50mcg/ac72msal spray, 2 sprays into each nostril every day - Appropriate, Effective, Safe, Accessible -Medications previously tried: none  -Recommended to  continue current medication Counseled on avoiding allergy triggers  GERD (Goal: minimize symptoms) -Controlled -Current treatment  Omeprazole 40 mg 1 capsule daily - Appropriate, Effective, Safe, Accessible -Medications previously tried: none  -Recommended to continue current medication   Health Maintenance -Vaccine gaps: none -Current therapy:  Vitamin B complex 1 tablet daily Ginkgo biloba 1 tablet daily Ginseng complex daily Active liver daily Vitamin D3 1000 units, 1 tablet once daily Calcium-mag-zinc daily  -Educated on Herbal supplement research is limited and benefits usually cannot be proven -Patient is satisfied with current therapy and denies issues -Educated on risk vs benefit of supplementation  Patient Goals/Self-Care Activities Patient will:  - take medications as prescribed check glucose daily, document, and provide at future appointments check blood pressure weekly, document, and provide at future appointments  Follow Up Plan: Telephone follow up appointment with care management team member scheduled for: 4-5 months       Medication Assistance: None required.  Patient affirms current coverage meets needs.  Compliance/Adherence/Medication fill history: Care Gaps: Eye exam, urine microalbumin, foot exam, COVID booster Last BP - 130/82 on 06/05/2021 A1C - 7.3 on 03/13/2021  Star-Rating Drugs: Atorvastatin 73m - last filled [facility]/04/2021 [facility] Glipizide 566m- last filled 08/09/2021 [facility] Januvia 10024m last filled 08/24/2021 [facility] Metformin 500 mg - last filled 06/06/2021 [facility] verified with KelLambert Ketoatient's preferred pharmacy is:  [id number]: [phone]: [phone] pill box? No - reports medications in separate bags. Has own management system.Reports never forgetting to take it.   Pt endorses 100% compliance  We discussed: Current pharmacy is preferred with insurance plan and patient is satisfied with pharmacy services Patient decided to: Continue current medication management strategy  Care Plan and Follow Up Patient Decision:  Patient agrees to Care Plan and Follow-up.  Plan: Telephone follow up appointment with care management team member scheduled for:  5 months  MadeJeni SallesarmD BCACOdessarmacist [place]-[phone]

## 2021-09-05 NOTE — Chronic Care Management (AMB) (Signed)
    Chronic Care Management Pharmacy Assistant   Name: CHANTILLE NAVARRETE  MRN: [id number] DOB: 1946-01-30  09/06/2021 APPOINTMENT REMINDER  Called [name], No answer, left message of appointment on 09/06/2021 at 11:30 via telephone visit with [name], Pharm D. Notified to have all medications, supplements, blood pressure and/or blood sugar logs available during appointment and to return call if need to reschedule.  Care Gaps: AWV - completed 11/06/20 Last BP - 130/82 on 06/05/2021 A1C - 7.3 on 03/13/2021 Malb - overdue Foot exam - overdue Eye exam - overdue Shingrix - overdue Covid booster - overdue  Star Rating Drug: Atorvastatin '10mg'$  - last filled [facility]/04/2021 [facility] Glipizide '5mg'$  - last filled 08/09/2021 [facility] Januvia '100mg'$  - last filled 08/24/2021 [facility] Metformin 500 mg - last filled [facility]/06/2021 [facility] verified with Lambert Keto  Any gaps in medications fill history? No  Gennie Alma [facility]  Catering manager [phone]

## 2021-09-06 ENCOUNTER — Ambulatory Visit (INDEPENDENT_AMBULATORY_CARE_PROVIDER_SITE_OTHER): Payer: Medicare HMO | Admitting: Pharmacist

## 2021-09-06 DIAGNOSIS — I10 Essential (primary) hypertension: Secondary | ICD-10-CM

## 2021-09-06 DIAGNOSIS — E1140 Type 2 diabetes mellitus with diabetic neuropathy, unspecified: Secondary | ICD-10-CM

## 2021-09-09 ENCOUNTER — Telehealth: Payer: Self-pay | Admitting: Family Medicine

## 2021-09-09 DIAGNOSIS — I10 Essential (primary) hypertension: Secondary | ICD-10-CM

## 2021-09-09 DIAGNOSIS — E1140 Type 2 diabetes mellitus with diabetic neuropathy, unspecified: Secondary | ICD-10-CM

## 2021-09-09 NOTE — Telephone Encounter (Signed)
Done

## 2021-09-23 ENCOUNTER — Encounter: Payer: Self-pay | Admitting: Family Medicine

## 2021-09-23 ENCOUNTER — Other Ambulatory Visit: Payer: Self-pay | Admitting: Family Medicine

## 2021-09-23 ENCOUNTER — Ambulatory Visit (INDEPENDENT_AMBULATORY_CARE_PROVIDER_SITE_OTHER): Payer: Medicare HMO | Admitting: Family Medicine

## 2021-09-23 VITALS — BP 132/80 | Temp 98.6°F | Wt 170.0 lb

## 2021-09-23 DIAGNOSIS — G8929 Other chronic pain: Secondary | ICD-10-CM | POA: Diagnosis not present

## 2021-09-23 DIAGNOSIS — F1190 Opioid use, unspecified, uncomplicated: Secondary | ICD-10-CM

## 2021-09-23 DIAGNOSIS — M5440 Lumbago with sciatica, unspecified side: Secondary | ICD-10-CM | POA: Diagnosis not present

## 2021-09-23 MED ORDER — HYDROCODONE-ACETAMINOPHEN 10-325 MG PO TABS: 1.0000 | ORAL_TABLET | Freq: Four times a day (QID) | ORAL | 0 refills | Status: AC | PRN

## 2021-09-23 MED ORDER — HYDROCODONE-ACETAMINOPHEN 10-325 MG PO TABS: 1.0000 | ORAL_TABLET | Freq: Four times a day (QID) | ORAL | 0 refills | Status: DC | PRN

## 2021-09-23 MED ORDER — METFORMIN HCL 500 MG PO TABS: 500.0000 mg | ORAL_TABLET | Freq: Two times a day (BID) | ORAL | 3 refills | Status: DC

## 2021-09-23 MED ORDER — FUROSEMIDE 20 MG PO TABS: 20.0000 mg | ORAL_TABLET | Freq: Every day | ORAL | 3 refills | Status: DC | PRN

## 2021-09-23 NOTE — Progress Notes (Signed)
    Subjective:    Patient ID: Wendy [name], female    DOB: 31-Oct-1945, 76 y.o.   MRN: [id number]  HPI Here for pain management. Her back pain is about the same as before. She still works as a Actuary.    Review of Systems  Constitutional: Negative.   Musculoskeletal:  Positive for back pain.       Objective:   Physical Exam Constitutional:      Appearance: Normal appearance.  Neurological:     Mental Status: She is alert.           Assessment & Plan:  Pain management. Indication for chronic opioid: low back pain Medication and dose: Norco 10-325 # pills per month: 120  Last UDS date: 09-23-21 Opioid Treatment Agreement signed (Y/N): 06-19-17 Opioid Treatment Agreement last reviewed with patient:  09-23-21 NCCSRS reviewed this encounter (include red flags): Yes Meds were refilled.  [name], MD

## 2021-09-26 ENCOUNTER — Other Ambulatory Visit: Payer: Self-pay | Admitting: Family Medicine

## 2021-09-26 LAB — DRUG MONITOR, PANEL 1, W/CONF, URINE
Amphetamines: NEGATIVE ng/mL (ref <500)
Barbiturates: NEGATIVE ng/mL (ref <300)
Benzodiazepines: NEGATIVE ng/mL (ref <100)
Cocaine Metabolite: NEGATIVE ng/mL (ref <150)
Codeine: NEGATIVE ng/mL (ref <50)
Creatinine: 139.9 mg/dL (ref >=20.0)
Hydrocodone: 2222 ng/mL — ABNORMAL HIGH (ref <50)
Hydromorphone: 274 ng/mL — ABNORMAL HIGH (ref <50)
Marijuana Metabolite: 191 ng/mL — ABNORMAL HIGH (ref <5)
Marijuana Metabolite: POSITIVE ng/mL — AB (ref <20)
Methadone Metabolite: NEGATIVE ng/mL (ref <100)
Morphine: NEGATIVE ng/mL (ref <50)
Norhydrocodone: 2441 ng/mL — ABNORMAL HIGH (ref <50)
Opiates: POSITIVE ng/mL — AB (ref <100)
Oxidant: NEGATIVE ug/mL (ref <200)
Oxycodone: NEGATIVE ng/mL (ref <100)
Phencyclidine: NEGATIVE ng/mL (ref <25)
pH: 5.1 (ref 4.5–9.0)

## 2021-09-26 LAB — DM TEMPLATE

## 2021-09-30 DIAGNOSIS — E1140 Type 2 diabetes mellitus with diabetic neuropathy, unspecified: Secondary | ICD-10-CM

## 2021-09-30 DIAGNOSIS — I10 Essential (primary) hypertension: Secondary | ICD-10-CM

## 2021-10-08 ENCOUNTER — Ambulatory Visit (INDEPENDENT_AMBULATORY_CARE_PROVIDER_SITE_OTHER): Payer: Medicare HMO | Admitting: Family Medicine

## 2021-10-08 ENCOUNTER — Encounter: Payer: Self-pay | Admitting: Family Medicine

## 2021-10-08 VITALS — BP 136/80 | Temp 98.3°F | Ht 64.0 in | Wt 167.4 lb

## 2021-10-08 DIAGNOSIS — E1140 Type 2 diabetes mellitus with diabetic neuropathy, unspecified: Secondary | ICD-10-CM | POA: Diagnosis not present

## 2021-10-08 DIAGNOSIS — I10 Essential (primary) hypertension: Secondary | ICD-10-CM

## 2021-10-08 DIAGNOSIS — G8929 Other chronic pain: Secondary | ICD-10-CM | POA: Diagnosis not present

## 2021-10-08 DIAGNOSIS — M5440 Lumbago with sciatica, unspecified side: Secondary | ICD-10-CM

## 2021-10-08 DIAGNOSIS — R609 Edema, unspecified: Secondary | ICD-10-CM

## 2021-10-08 DIAGNOSIS — K219 Gastro-esophageal reflux disease without esophagitis: Secondary | ICD-10-CM | POA: Diagnosis not present

## 2021-10-08 DIAGNOSIS — M069 Rheumatoid arthritis, unspecified: Secondary | ICD-10-CM

## 2021-10-08 LAB — HEPATIC FUNCTION PANEL
ALT: 13 U/L (ref 0–35)
AST: 20 U/L (ref 0–37)
Albumin: 4.1 g/dL (ref 3.5–5.2)
Alkaline Phosphatase: 61 U/L (ref 39–117)
Bilirubin, Direct: 0.1 mg/dL (ref 0.0–0.3)
Total Bilirubin: 0.7 mg/dL (ref 0.2–1.2)
Total Protein: 7.9 g/dL (ref 6.0–8.3)

## 2021-10-08 LAB — LIPID PANEL
Cholesterol: 152 mg/dL (ref 0–200)
HDL: 56.2 mg/dL (ref >39.00)
LDL Cholesterol: 82 mg/dL (ref 0–99)
NonHDL: 95.98
Total CHOL/HDL Ratio: 3
Triglycerides: 68 mg/dL (ref 0.0–149.0)
VLDL: 13.6 mg/dL (ref 0.0–40.0)

## 2021-10-08 LAB — BASIC METABOLIC PANEL
BUN: 10 mg/dL (ref 6–23)
CO2: 30 mEq/L (ref 19–32)
Calcium: 9.7 mg/dL (ref 8.4–10.5)
Chloride: 102 mEq/L (ref 96–112)
Creatinine, Ser: 0.76 mg/dL (ref 0.40–1.20)
GFR: 77.01 mL/min (ref >60.00)
Glucose, Bld: 97 mg/dL (ref 70–99)
Potassium: 4.3 mEq/L (ref 3.5–5.1)
Sodium: 144 mEq/L (ref 135–145)

## 2021-10-08 LAB — CBC WITH DIFFERENTIAL/PLATELET
Basophils Absolute: 0 10*3/uL (ref 0.0–0.1)
Basophils Relative: 0.3 % (ref 0.0–3.0)
Eosinophils Absolute: 0 10*3/uL (ref 0.0–0.7)
Eosinophils Relative: 0.6 % (ref 0.0–5.0)
HCT: 40.8 % (ref 36.0–46.0)
Hemoglobin: 13.6 g/dL (ref 12.0–15.0)
Lymphocytes Relative: 32 % (ref 12.0–46.0)
Lymphs Abs: 2.4 10*3/uL (ref 0.7–4.0)
MCHC: 33.3 g/dL (ref 30.0–36.0)
MCV: 79.2 fl (ref 78.0–100.0)
Monocytes Absolute: 0.9 10*3/uL (ref 0.1–1.0)
Monocytes Relative: 12.2 % — ABNORMAL HIGH (ref 3.0–12.0)
Neutro Abs: 4 10*3/uL (ref 1.4–7.7)
Neutrophils Relative %: 54.9 % (ref 43.0–77.0)
Platelets: 215 10*3/uL (ref 150.0–400.0)
RBC: 5.14 Mil/uL — ABNORMAL HIGH (ref 3.87–5.11)
RDW: 17.4 % — ABNORMAL HIGH (ref 11.5–15.5)
WBC: 7.4 10*3/uL (ref 4.0–10.5)

## 2021-10-08 LAB — TSH: TSH: 1.18 u[IU]/mL (ref 0.35–5.50)

## 2021-10-08 LAB — HEMOGLOBIN A1C: Hgb A1c MFr Bld: 7.5 % — ABNORMAL HIGH (ref 4.6–6.5)

## 2021-10-08 NOTE — Progress Notes (Signed)
   Subjective:    Patient ID: Wendy [name], female    DOB: Nov 29, 1945, 76 y.o.   MRN: [id number]  HPI Here to follow up on issues. She feels well other than her chronic back pain. Her BP has been stable at home. Her glucoses are also well controlled. Her fasting glucose this morning was 100.    Review of Systems  Constitutional: Negative.   HENT: Negative.    Eyes: Negative.   Respiratory: Negative.    Cardiovascular: Negative.   Gastrointestinal: Negative.   Genitourinary:  Negative for decreased urine volume, difficulty urinating, dyspareunia, dysuria, enuresis, flank pain, frequency, hematuria, pelvic pain and urgency.  Musculoskeletal:  Positive for arthralgias and back pain.  Skin: Negative.   Neurological: Negative.  Negative for headaches.  Psychiatric/Behavioral: Negative.         Objective:   Physical Exam Constitutional:      General: She is not in acute distress.    Appearance: Normal appearance. She is well-developed.  HENT:     Head: Normocephalic and atraumatic.     Right Ear: External ear normal.     Left Ear: External ear normal.     Nose: Nose normal.     Mouth/Throat:     Pharynx: No oropharyngeal exudate.  Eyes:     General: No scleral icterus.    Conjunctiva/sclera: Conjunctivae normal.     Pupils: Pupils are equal, round, and reactive to light.  Neck:     Thyroid: No thyromegaly.     Vascular: No JVD.  Cardiovascular:     Rate and Rhythm: Normal rate and regular rhythm.     Heart sounds: Normal heart sounds. No murmur heard.    No friction rub. No gallop.  Pulmonary:     Effort: Pulmonary effort is normal. No respiratory distress.     Breath sounds: Normal breath sounds. No wheezing or rales.  Chest:     Chest wall: No tenderness.  Abdominal:     General: Bowel sounds are normal. There is no distension.     Palpations: Abdomen is soft. There is no mass.     Tenderness: There is no abdominal tenderness. There is no guarding or rebound.   Musculoskeletal:        General: No tenderness. Normal range of motion.     Cervical back: Normal range of motion and neck supple.  Lymphadenopathy:     Cervical: No cervical adenopathy.  Skin:    General: Skin is warm and dry.     Findings: No erythema or rash.  Neurological:     Mental Status: She is alert and oriented to person, place, and time.     Cranial Nerves: No cranial nerve deficit.     Motor: No abnormal muscle tone.     Coordination: Coordination normal.     Deep Tendon Reflexes: Reflexes are normal and symmetric. Reflexes normal.  Psychiatric:        Behavior: Behavior normal.        Thought Content: Thought content normal.        Judgment: Judgment normal.           Assessment & Plan:  Her HTN is well controlled. We will get fasting labs to check her A1c, lipids, renal function, etc. Her RA and back pain are stable. Her GERD is stable.  [name], MD

## 2021-10-11 ENCOUNTER — Other Ambulatory Visit: Payer: Self-pay | Admitting: Family Medicine

## 2021-10-11 ENCOUNTER — Other Ambulatory Visit: Payer: Self-pay

## 2021-10-11 DIAGNOSIS — K219 Gastro-esophageal reflux disease without esophagitis: Secondary | ICD-10-CM

## 2021-10-11 MED ORDER — OMEPRAZOLE 40 MG PO CPDR: DELAYED_RELEASE_CAPSULE | ORAL | 0 refills | Status: DC

## 2021-10-11 MED ORDER — GLIPIZIDE 10 MG PO TABS: 10.0000 mg | ORAL_TABLET | Freq: Two times a day (BID) | ORAL | 3 refills | Status: DC

## 2021-10-11 MED ORDER — METHOCARBAMOL 500 MG PO TABS: 500.0000 mg | ORAL_TABLET | Freq: Four times a day (QID) | ORAL | 0 refills | Status: DC | PRN

## 2021-10-11 NOTE — Telephone Encounter (Signed)
Last OV-10/08/21 Last refill- 04/11/21-90 tabs, 1 refills  No future Ov scheduled.

## 2021-10-11 NOTE — Addendum Note (Signed)
Addended by: Agnes Lawrence on: 10/11/2021 09:42 AM   Modules accepted: Orders

## 2021-10-14 NOTE — Telephone Encounter (Signed)
Pt was last seen on 10-08-2021 and would like a refill

## 2021-10-15 ENCOUNTER — Telehealth: Payer: Self-pay | Admitting: Family Medicine

## 2021-10-15 NOTE — Telephone Encounter (Signed)
Spoke with patient aware Dr. Sarajane Jews sent the prescription for Xanax '1mg'$   to [facility].

## 2021-10-15 NOTE — Telephone Encounter (Signed)
Pt called to follow up on her request for a refill of the  ALPRAZolam Duanne Moron) 1 MG tablet  Pt states she has been calling since 10/11/21.  Last OV: 10/08/21 (CPE)  Please advise. [id number]  [phone] Fax:  [phone]

## 2021-10-31 ENCOUNTER — Other Ambulatory Visit: Payer: Self-pay | Admitting: Family Medicine

## 2021-11-15 ENCOUNTER — Telehealth: Payer: Self-pay

## 2021-11-15 NOTE — Telephone Encounter (Signed)
Contacted patient on preferred number listed in notes for scheduled AWV.  Patient stated unable to complete visit at this time will call back to reschedule 

## 2021-11-20 ENCOUNTER — Other Ambulatory Visit: Payer: Self-pay | Admitting: Family Medicine

## 2021-11-20 DIAGNOSIS — E1140 Type 2 diabetes mellitus with diabetic neuropathy, unspecified: Secondary | ICD-10-CM

## 2021-11-29 ENCOUNTER — Ambulatory Visit (INDEPENDENT_AMBULATORY_CARE_PROVIDER_SITE_OTHER): Payer: Medicare HMO

## 2021-11-29 VITALS — Ht 64.0 in | Wt 167.0 lb

## 2021-11-29 DIAGNOSIS — Z0000 Encounter for general adult medical examination without abnormal findings: Secondary | ICD-10-CM

## 2021-11-29 NOTE — Patient Instructions (Addendum)
[name] , Thank you for taking time to come for your Medicare Wellness Visit. I appreciate your ongoing commitment to your health goals. Please review the following plan we discussed and let me know if I can assist you in the future.   These are the goals we discussed:  Goals       Patient Stated      I want to lose this belly fat       Quit smoking / using tobacco      Smoking;  Educated to avoid secondary smoke Smoking cessation in [place]: [place] at [phone] -day Smoking cessation in : Evening; [phone] Smoking cessation at [facility]: [phone] Smoking cessation at [facility]: [phone] Classes offered a couple of times a month;  Will work with the patient as far as registration and location  Meds may help; chatix (Varenicline); Zyban (Bupropion SR); Nicotine Replacement (gum; lozenges; patches; etc.)   30 pack yr smoking hx: Educated regarding LDCT; To discuss with MD at next fup. Also educated on AAA screening for men 65-75 who have smoked  [place] Quit; line; 1-800-QUIT-NOW [phone])       Stay Active (pt-stated)      Weight (lb) < 148 lb (67.1 kg)      Cut back on breads;  Eat more lean meat; chicken; fish; tuna Will cut back on fruits and more vegetables   Fat free or low fat dairy products Fish high in omega-3 acids ( salmon, tuna, trout) Fruits, such as apples, bananas, oranges, pears, prunes Legumes, such as kidney beans, lentils, checkpeas, black-eyed peas and lima beans Vegetables; broccoli, cabbage, carrots Whole grains;   Plant fats are better; decrease "white" foods as pasta, rice, bread and desserts, sugar; Avoid red meat (limiting) palm and coconut oils; sugary foods and beverages  Two nutrients that raise blood chol levels are saturated fats and trans fat; in hydrogenated oils and fats, as stick margarine, baked goods (cookes, cakes, pies, crackers; frosting; and coffee creamers;   Some Fats lower cholesterol:  Monounsaturated and polyunsaturated  Avocados Corn, sunflower, and soybean oils Nuts and seeds, such as walnuts Olive, canola, peanut, safflower, and sesame oils Peanut butter Salmon and trout Tofu         Weight (lb) < 200 lb (90.7 kg)        This is a list of the screening recommended for you and due dates:  Health Maintenance  Topic Date Due   Yearly kidney health urinalysis for diabetes  10/20/2014   Complete foot exam   10/27/2014   Eye exam for diabetics  05/21/2016   Flu Shot  06/01/2022*   COVID-19 Vaccine (4 - Moderna series) 11/01/2022*   Hemoglobin A1C  04/10/2022   Yearly kidney function blood test for diabetes  10/09/2022   Mammogram  04/11/2023   Colon Cancer Screening  11/23/2026   Tetanus Vaccine  03/07/2031   Pneumonia Vaccine  Completed   DEXA scan (bone density measurement)  Completed   Hepatitis C Screening: USPSTF Recommendation to screen - Ages 93-79 yo.  Completed   Zoster (Shingles) Vaccine  Completed   HPV Vaccine  Aged Out  *Topic was postponed. The date shown is not the original due date.  Opioid Pain Medicine Management Opioids are powerful medicines that are used to treat moderate to severe pain. When used for short periods of time, they can help you to: Sleep better. Do better in physical or occupational therapy. Feel better in the first few days after an  injury. Recover from surgery. Opioids should be taken with the supervision of a trained health care provider. They should be taken for the shortest period of time possible. This is because opioids can be addictive, and the longer you take opioids, the greater your risk of addiction. This addiction can also be called opioid use disorder. What are the risks? Using opioid pain medicines for longer than 3 days increases your risk of side effects. Side effects include: Constipation. Nausea and vomiting. Breathing difficulties (respiratory depression). Drowsiness. Confusion. Opioid use  disorder. Itching. Taking opioid pain medicine for a long period of time can affect your ability to do daily tasks. It also puts you at risk for: Motor vehicle crashes. Depression. Suicide. Heart attack. Overdose, which can be life-threatening. What is a pain treatment plan? A pain treatment plan is an agreement between you and your health care provider. Pain is unique to each person, and treatments vary depending on your condition. To manage your pain, you and your health care provider need to work together. To help you do this: Discuss the goals of your treatment, including how much pain you might expect to have and how you will manage the pain. Review the risks and benefits of taking opioid medicines. Remember that a good treatment plan uses more than one approach and minimizes the chance of side effects. Be honest about the amount of medicines you take and about any drug or alcohol use. Get pain medicine prescriptions from only one health care provider. Pain can be managed with many types of alternative treatments. Ask your health care provider to refer you to one or more specialists who can help you manage pain through: Physical or occupational therapy. Counseling (cognitive behavioral therapy). Good nutrition. Biofeedback. Massage. Meditation. Non-opioid medicine. Following a gentle exercise program. How to use opioid pain medicine Taking medicine Take your pain medicine exactly as told by your health care provider. Take it only when you need it. If your pain gets less severe, you may take less than your prescribed dose if your health care provider approves. If you are not having pain, do nottake pain medicine unless your health care provider tells you to take it. If your pain is severe, do nottry to treat it yourself by taking more pills than instructed on your prescription. Contact your health care provider for help. Write down the times when you take your pain medicine. It is  easy to become confused while on pain medicine. Writing the time can help you avoid overdose. Take other over-the-counter or prescription medicines only as told by your health care provider. Keeping yourself and others safe  While you are taking opioid pain medicine: Do not drive, use machinery, or power tools. Do not sign legal documents. Do not drink alcohol. Do not take sleeping pills. Do not supervise children by yourself. Do not do activities that require climbing or being in high places. Do not go to a lake, river, ocean, spa, or swimming pool. Do not share your pain medicine with anyone. Keep pain medicine in a locked cabinet or in a secure area where pets and children cannot reach it. Stopping your use of opioids If you have been taking opioid medicine for more than a few weeks, you may need to slowly decrease (taper) how much you take until you stop completely. Tapering your use of opioids can decrease your risk of symptoms of withdrawal, such as: Pain and cramping in the abdomen. Nausea. Sweating. Sleepiness. Restlessness. Uncontrollable shaking (tremors). Cravings for the  medicine. Do not attempt to taper your use of opioids on your own. Talk with your health care provider about how to do this. Your health care provider may prescribe a step-down schedule based on how much medicine you are taking and how long you have been taking it. Getting rid of leftover pills Do not save any leftover pills. Get rid of leftover pills safely by: Taking the medicine to a prescription take-back program. This is usually offered by the county or law enforcement. Bringing them to a pharmacy that has a drug disposal container. Flushing them down the toilet. Check the label or package insert of your medicine to see whether this is safe to do. Throwing them out in the trash. Check the label or package insert of your medicine to see whether this is safe to do. If it is safe to throw it out, remove  the medicine from the original container, put it into a sealable bag or container, and mix it with used coffee grounds, food scraps, dirt, or cat litter before putting it in the trash. Follow these instructions at home: Activity Do exercises as told by your health care provider. Avoid activities that make your pain worse. Return to your normal activities as told by your health care provider. Ask your health care provider what activities are safe for you. General instructions You may need to take these actions to prevent or treat constipation: Drink enough fluid to keep your urine pale yellow. Take over-the-counter or prescription medicines. Eat foods that are high in fiber, such as beans, whole grains, and fresh fruits and vegetables. Limit foods that are high in fat and processed sugars, such as fried or sweet foods. Keep all follow-up visits. This is important. Where to find support If you have been taking opioids for a long time, you may benefit from receiving support for quitting from a local support group or counselor. Ask your health care provider for a referral to these resources in your area. Where to find more information Centers for Disease Control and Prevention (CDC): http://www.wolf.info/ [place] Food and Drug Administration (FDA): [place] Get help right away if: You may have taken too much of an opioid (overdosed). Common symptoms of an overdose: Your breathing is slower or more shallow than normal. You have a very slow heartbeat (pulse). You have slurred speech. You have nausea and vomiting. Your pupils become very small. You have other potential symptoms: You are very confused. You faint or feel like you will faint. You have cold, clammy skin. You have blue lips or fingernails. You have thoughts of harming yourself or harming others. These symptoms may represent a serious problem that is an emergency. Do not wait to see if the symptoms will go away. Get medical help right away.  Call your local emergency services (911 in the [place] Do not drive yourself to the hospital.  If you ever feel like you may hurt yourself or others, or have thoughts about taking your own life, get help right away. Go to your nearest emergency department or: Call your local emergency services (911 in the [place] Call the [facility] 2347743080 in the [place] Call a suicide crisis helpline, such as the [facility] at 743 006 6080 or 988 in the [place]. This is open 24 hours a day in the [place] Text the Crisis Text Line at 773-153-5132 (in the [place] Summary Opioid medicines can help you manage moderate to severe pain for a short period of time. A pain treatment  plan is an agreement between you and your health care provider. Discuss the goals of your treatment, including how much pain you might expect to have and how you will manage the pain. If you think that you or someone else may have taken too much of an opioid, get medical help right away. This information is not intended to replace advice given to you by your health care provider. Make sure you discuss any questions you have with your health care provider. Document Revised: 09/12/2020 Document Reviewed: 05/30/2020 Elsevier Patient Education  [place] directives: Advance directive discussed with you today. Even though you declined this today, please call our office should you change your mind, and we can give you the proper paperwork for you to fill out.   Conditions/risks identified: None  Next appointment: Follow up in one year for your annual wellness visit     Preventive Care 65 Years and Older, Female Preventive care refers to lifestyle choices and visits with your health care provider that can promote health and wellness. What does preventive care include? A yearly physical exam. This is also called an annual well check. Dental exams once or twice a year. Routine eye  exams. Ask your health care provider how often you should have your eyes checked. Personal lifestyle choices, including: Daily care of your teeth and gums. Regular physical activity. Eating a healthy diet. Avoiding tobacco and drug use. Limiting alcohol use. Practicing safe sex. Taking low-dose aspirin every day. Taking vitamin and mineral supplements as recommended by your health care provider. What happens during an annual well check? The services and screenings done by your health care provider during your annual well check will depend on your age, overall health, lifestyle risk factors, and family history of disease. Counseling  Your health care provider may ask you questions about your: Alcohol use. Tobacco use. Drug use. Emotional well-being. Home and relationship well-being. Sexual activity. Eating habits. History of falls. Memory and ability to understand (cognition). Work and work Statistician. Reproductive health. Screening  You may have the following tests or measurements: Height, weight, and BMI. Blood pressure. Lipid and cholesterol levels. These may be checked every 5 years, or more frequently if you are over 49 years old. Skin check. Lung cancer screening. You may have this screening every year starting at age 75 if you have a 30-pack-year history of smoking and currently smoke or have quit within the past 15 years. Fecal occult blood test (FOBT) of the stool. You may have this test every year starting at age 33. Flexible sigmoidoscopy or colonoscopy. You may have a sigmoidoscopy every 5 years or a colonoscopy every 10 years starting at age 82. Hepatitis C blood test. Hepatitis B blood test. Sexually transmitted disease (STD) testing. Diabetes screening. This is done by checking your blood sugar (glucose) after you have not eaten for a while (fasting). You may have this done every 1-3 years. Bone density scan. This is done to screen for osteoporosis. You may have  this done starting at age 40. Mammogram. This may be done every 1-2 years. Talk to your health care provider about how often you should have regular mammograms. Talk with your health care provider about your test results, treatment options, and if necessary, the need for more tests. Vaccines  Your health care provider may recommend certain vaccines, such as: Influenza vaccine. This is recommended every year. Tetanus, diphtheria, and acellular pertussis (Tdap, Td) vaccine. You may need a Td booster every 10  years. Zoster vaccine. You may need this after age 13. Pneumococcal 13-valent conjugate (PCV13) vaccine. One dose is recommended after age 22. Pneumococcal polysaccharide (PPSV23) vaccine. One dose is recommended after age 49. Talk to your health care provider about which screenings and vaccines you need and how often you need them. This information is not intended to replace advice given to you by your health care provider. Make sure you discuss any questions you have with your health care provider. Document Released: 03/16/2015 Document Revised: 11/07/2015 Document Reviewed: 12/19/2014 Elsevier Interactive Patient Education  2017 [place] Prevention in the Home Falls can cause injuries. They can happen to people of all ages. There are many things you can do to make your home safe and to help prevent falls. What can I do on the outside of my home? Regularly fix the edges of walkways and driveways and fix any cracks. Remove anything that might make you trip as you walk through a door, such as a raised step or threshold. Trim any bushes or trees on the path to your home. Use bright outdoor lighting. Clear any walking paths of anything that might make someone trip, such as rocks or tools. Regularly check to see if handrails are loose or broken. Make sure that both sides of any steps have handrails. Any raised decks and porches should have guardrails on the edges. Have any leaves,  snow, or ice cleared regularly. Use sand or salt on walking paths during winter. Clean up any spills in your garage right away. This includes oil or grease spills. What can I do in the bathroom? Use night lights. Install grab bars by the toilet and in the tub and shower. Do not use towel bars as grab bars. Use non-skid mats or decals in the tub or shower. If you need to sit down in the shower, use a plastic, non-slip stool. Keep the floor dry. Clean up any water that spills on the floor as soon as it happens. Remove soap buildup in the tub or shower regularly. Attach bath mats securely with double-sided non-slip rug tape. Do not have throw rugs and other things on the floor that can make you trip. What can I do in the bedroom? Use night lights. Make sure that you have a light by your bed that is easy to reach. Do not use any sheets or blankets that are too big for your bed. They should not hang down onto the floor. Have a firm chair that has side arms. You can use this for support while you get dressed. Do not have throw rugs and other things on the floor that can make you trip. What can I do in the kitchen? Clean up any spills right away. Avoid walking on wet floors. Keep items that you use a lot in easy-to-reach places. If you need to reach something above you, use a strong step stool that has a grab bar. Keep electrical cords out of the way. Do not use floor polish or wax that makes floors slippery. If you must use wax, use non-skid floor wax. Do not have throw rugs and other things on the floor that can make you trip. What can I do with my stairs? Do not leave any items on the stairs. Make sure that there are handrails on both sides of the stairs and use them. Fix handrails that are broken or loose. Make sure that handrails are as long as the stairways. Check any carpeting to make sure that  it is firmly attached to the stairs. Fix any carpet that is loose or worn. Avoid having throw  rugs at the top or bottom of the stairs. If you do have throw rugs, attach them to the floor with carpet tape. Make sure that you have a light switch at the top of the stairs and the bottom of the stairs. If you do not have them, ask someone to add them for you. What else can I do to help prevent falls? Wear shoes that: Do not have high heels. Have rubber bottoms. Are comfortable and fit you well. Are closed at the toe. Do not wear sandals. If you use a stepladder: Make sure that it is fully opened. Do not climb a closed stepladder. Make sure that both sides of the stepladder are locked into place. Ask someone to hold it for you, if possible. Clearly mark and make sure that you can see: Any grab bars or handrails. First and last steps. Where the edge of each step is. Use tools that help you move around (mobility aids) if they are needed. These include: Canes. Walkers. Scooters. Crutches. Turn on the lights when you go into a dark area. Replace any light bulbs as soon as they burn out. Set up your furniture so you have a clear path. Avoid moving your furniture around. If any of your floors are uneven, fix them. If there are any pets around you, be aware of where they are. Review your medicines with your doctor. Some medicines can make you feel dizzy. This can increase your chance of falling. Ask your doctor what other things that you can do to help prevent falls. This information is not intended to replace advice given to you by your health care provider. Make sure you discuss any questions you have with your health care provider. Document Released: 12/14/2008 Document Revised: 07/26/2015 Document Reviewed: 03/24/2014 Elsevier Interactive Patient Education  2017 [employer].

## 2021-11-29 NOTE — Progress Notes (Addendum)
Subjective:   Wendy [name] is a 76 y.o. female who presents for Medicare Annual (Subsequent) preventive examination.  Review of Systems    Virtual Visit via Telephone Note  I connected with  Wendy [name] on 11/29/21 at  1:00 PM EDT by telephone and verified that I am speaking with the correct person using two identifiers.  Location: Patient: Home Provider: Office Persons participating in the virtual visit: patient/Nurse Health Advisor   I discussed the limitations, risks, security and privacy concerns of performing an evaluation and management service by telephone and the availability of in person appointments. The patient expressed understanding and agreed to proceed.  Interactive audio and video telecommunications were attempted between this nurse and patient, however failed, due to patient having technical difficulties OR patient did not have access to video capability.  We continued and completed visit with audio only.  Some vital signs may be absent or patient reported.   [name], LPN  Cardiac Risk Factors include: advanced age (>20mn, >>60women);diabetes mellitus;hypertension;smoking/ tobacco exposure     Objective:    Today's Vitals   11/29/21 1312  Weight: 167 lb (75.8 kg)  Height: 5' 4"  (1.626 m)   Body mass index is 28.67 kg/m.     11/29/2021    1:20 PM 11/06/2020    3:24 PM 10/11/2019   11:29 AM 02/20/2017    5:32 PM 04/18/2016    3:49 PM  Advanced Directives  Does Patient Have a Medical Advance Directive? No No No No No  Would patient like information on creating a medical advance directive? No - Patient declined No - Patient declined No - Patient declined No - Patient declined     Current Medications (verified) Outpatient Encounter Medications as of 11/29/2021  Medication Sig   ACCU-CHEK GUIDE test strip USE TO TEST ONCE DAILY   ALPRAZolam (XANAX) 1 MG tablet TAKE 1 TABLET (1 MG TOTAL) BY MOUTH AT BEDTIME AS NEEDED FOR ANXIETY OR SLEEP.    amLODipine (NORVASC) 10 MG tablet TAKE 1 TABLET BY MOUTH EVERY DAY   atorvastatin (LIPITOR) 10 MG tablet TAKE 1 TABLET BY MOUTH EVERY DAY   b complex vitamins tablet Take 1 tablet by mouth daily.   Blood Glucose Monitoring Suppl (ACCU-CHEK GUIDE) w/Device KIT Use to test sugars daily.   Blood Pressure Monitor KIT Use to check blood pressure.   CALCIUM-VITAMIN D PO Take 1 tablet by mouth daily as needed (supplemental).    Cholecalciferol (VITAMIN D3) 20 MCG (800 UNIT) TABS Take 1 tablet by mouth daily.    fluticasone (FLONASE) 50 MCG/ACT nasal spray SPRAY 2 SPRAYS INTO EACH NOSTRIL EVERY DAY   furosemide (LASIX) 20 MG tablet Take 1 tablet (20 mg total) by mouth daily as needed (fluid retention).   Ginkgo Biloba (GNP GINGKO BILOBA EXTRACT PO) Take by mouth.   glipiZIDE (GLUCOTROL) 10 MG tablet Take 1 tablet (10 mg total) by mouth 2 (two) times daily before a meal.   HYDROcodone-acetaminophen (NORCO) 10-325 MG tablet Take 1 tablet by mouth every 6 (six) hours as needed for severe pain.   hydroxychloroquine (PLAQUENIL) 200 MG tablet TAKE 1 TABLET BY MOUTH EVERY DAY   JANUVIA 100 MG tablet TAKE 1 TABLET BY MOUTH EVERY DAY   Lancets (ACCU-CHEK SOFT TOUCH) lancets Test once per day and diagnosis code is E 11.9 and dispense for Aviva plus   levocetirizine (XYZAL) 5 MG tablet TAKE 1 TABLET BY MOUTH EVERY DAY IN THE EVENING   metFORMIN (GLUCOPHAGE)  500 MG tablet Take 1 tablet (500 mg total) by mouth 2 (two) times daily with a meal.   methocarbamol (ROBAXIN) 500 MG tablet Take 1 tablet (500 mg total) by mouth every 6 (six) hours as needed for muscle spasms.   Misc Natural Products (GINSENG COMPLEX PO) Take by mouth. As needed for energy   naproxen sodium (ALEVE) 220 MG tablet Take 220 mg by mouth 2 (two) times daily as needed.   Omega-3 Fatty Acids (OMEGA 3 PO) Take by mouth daily.   omeprazole (PRILOSEC) 40 MG capsule TAKE 1 CAPSULE BY MOUTH EVERY DAY   ondansetron (ZOFRAN-ODT) 4 MG disintegrating tablet  TAKE 1 TABLET BY MOUTH EVERY 8 HOURS AS NEEDED FOR NAUSEA AND VOMITING   OVER THE COUNTER MEDICATION Active Liver Daily   Polyethylene Glycol 3350 (MIRALAX PO) Take by mouth as needed.   potassium chloride (KLOR-CON) 10 MEQ tablet TAKE 1 TABLET BY MOUTH EVERY DAY   pregabalin (LYRICA) 100 MG capsule Take 1 capsule (100 mg total) by mouth at bedtime.   No facility-administered encounter medications on file as of 11/29/2021.    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 Gavin Pound    Past Surgical History:  Procedure Laterality Date   COLONOSCOPY  11/23/2019   per Dr. Ardis Hughs, adenmatous polyp, repeat in 7 yrs   ESOPHAGOGASTRODUODENOSCOPY  06/20/2008   at [facility], clear    [facility] ENDOSCOPY  2010   [facility]    Family History  Problem Relation Age of Onset   Arthritis Other    Diabetes Other    Hypertension Other    Cancer Other        lung   Lung cancer Mother    Colon cancer Neg Hx    Colon polyps Neg Hx    Esophageal cancer Neg Hx    Rectal cancer Neg Hx    Stomach cancer Neg Hx    Social History   Socioeconomic History   Marital status: Widowed    Spouse name: Not on file   Number of children: Not on file   Years of education: Not on file   Highest education level: Not on file  Occupational History   Not on file  Tobacco Use   Smoking status: Some Days     Packs/day: 0.03    Years: 45.00    Total pack years: 1.35    Types: Cigars, Cigarettes   Smokeless tobacco: Never   Tobacco comments:    1 black and mild every 2-3 days- no cigs - doesnt smoke the whole thing   Vaping Use   Vaping Use: Never used  Substance and Sexual Activity   Alcohol use: Yes    Alcohol/week: 0.0 standard drinks of  alcohol    Comment: occ   Drug use: Yes    Types: Marijuana   Sexual activity: Not on file  Other Topics Concern   Not on file  Social History Narrative   ** Merged History Encounter **       Social Determinants of Health   Financial Resource Strain: Low Risk  (11/29/2021)   Overall Financial Resource Strain (CARDIA)    Difficulty of Paying Living Expenses: Not hard at all  Food Insecurity: No Food Insecurity (11/29/2021)   Hunger Vital Sign    Worried About Running Out of Food in the Last Year: Never true    Ran Out of Food in the Last Year: Never true  Transportation Needs: No Transportation Needs (11/29/2021)   PRAPARE - Hydrologist (Medical): No    Lack of Transportation (Non-Medical): No  Physical Activity: Insufficiently Active (11/29/2021)   Exercise Vital Sign    Days of Exercise per Week: 7 days    Minutes of Exercise per Session: 20 min  Stress: No Stress Concern Present (11/29/2021)   [place]    Feeling of Stress : Not at all  Social Connections: [place] (11/29/2021)   Social Connection and Isolation Panel [NHANES]    Frequency of Communication with Friends and Family: More than three times a week    Frequency of Social Gatherings with Friends and Family: More than three times a week    Attends Religious Services: More than 4 times per year    Active Member of [employer] or Organizations: Yes    Attends Music therapist: More than 4 times per year    Marital Status: Married    Tobacco Counseling Ready to quit:  Yes Counseling given: Yes Tobacco comments: 1 black and mild every 2-3 days- no cigs - doesnt smoke the whole thing    Clinical Intake:  Pre-visit preparation completed: NoNutrition Risk Assessment:  Has the patient had any N/V/D within the last 2 months?  No  Does the patient have any non-healing wounds?  No  Has the patient had any unintentional weight loss or weight gain?  No   Diabetes:  Is the patient diabetic?  Yes  If diabetic, was a CBG obtained today?  No  Did the patient bring in their glucometer from home?  No  How often do you monitor your CBG's? 2 X Weekly.   Financial Strains and Diabetes Management:  Are you having any financial strains with the device, your supplies or your medication? No .  Does the patient want to be seen by Chronic Care Management for management of their diabetes?  No  Would the patient like to be referred to a Nutritionist or for Diabetic Management?  No   Diabetic Exams:  Diabetic Eye Exam: Completed No. Overdue for diabetic eye exam. Pt has been advised about the importance in completing this exam. A referral has been placed today. Message sent to referral coordinator for scheduling purposes. Advised pt to expect a call from office referred to regarding appt.  Diabetic Foot Exam: Completed No. Pt has been advised about the importance in completing this exam. Pt is scheduled for diabetic foot exam on Followed byPCP.    Pain : No/denies pain     BMI - recorded: 28.67 Nutritional Status: BMI 25 -29 Overweight Nutritional Risks: None Diabetes: Yes CBG done?: No Did pt. bring in CBG monitor from home?: No  How often  do you need to have someone help you when you read instructions, pamphlets, or other written materials from your doctor or pharmacy?: 1 - Never  Diabetic?  Yes     Information entered by :: Rolene Arbour LPN   Activities of Daily Living    11/29/2021    1:19 PM  In your present state of health, do you have any  difficulty performing the following activities:  Hearing? 0  Vision? 0  Difficulty concentrating or making decisions? 0  Walking or climbing stairs? 0  Dressing or bathing? 0  Doing errands, shopping? 0  Preparing Food and eating ? N  Using the Toilet? N  In the past six months, have you accidently leaked urine? N  Do you have problems with loss of bowel control? N  Managing your Medications? N  Managing your Finances? N  Housekeeping or managing your Housekeeping? N    Patient Care Team: Laurey Morale, MD as PCP - General Kipp Brood Mariam Dollar, [facility] as Pharmacist (Pharmacist)  Indicate any recent Medical Services you may have received from other than [facility] providers in the past year (date may be approximate).     Assessment:   This is a routine wellness examination for [place].  Hearing/Vision screen Hearing Screening - Comments:: Denies hearing difficulties   Vision Screening - Comments:: Wears rx glasses - up to date with routine eye exams with  Dr Twanna Hy  Dietary issues and exercise activities discussed: Current Exercise Habits: Home exercise routine, Type of exercise: walking;stretching, Time (Minutes): 20, Frequency (Times/Week): 7, Weekly Exercise (Minutes/Week): 140, Intensity: Moderate, Exercise limited by: None identified   Goals Addressed               This Visit's Progress     Stay Active (pt-stated)         Depression Screen    11/29/2021    1:18 PM 10/08/2021    1:33 PM 06/05/2021    1:49 PM 11/06/2020    3:25 PM 11/06/2020    3:22 PM 10/11/2019   11:34 AM 12/21/2017    2:09 PM  PHQ 2/9 Scores  PHQ - 2 Score 0 0 0 0 0 0 1  PHQ- 9 Score 0 3 0   0     Fall Risk    11/29/2021    1:20 PM 10/08/2021    1:32 PM 06/05/2021    1:48 PM 11/06/2020    3:25 PM 09/18/2020    1:11 PM  Fall Risk   Falls in the past year? 0 0 0 0 0  Number falls in past yr: 0 0 0 0 0  Injury with Fall? 0 0 0 0   Risk for fall due to : No Fall Risks No Fall Risks No Fall Risks No Fall Risks    Follow up Falls prevention discussed Falls evaluation completed Falls evaluation completed Falls evaluation completed     [place]:  Any stairs in or around the home? Yes  If so, are there any without handrails? No  Home free of loose throw rugs in walkways, pet beds, electrical cords, etc? Yes  Adequate lighting in your home to reduce risk of falls? Yes   ASSISTIVE DEVICES UTILIZED TO PREVENT FALLS:  Life alert? No  Use of a cane, walker or w/c? No  Grab bars in the bathroom? No  Shower chair or bench in shower? No  Elevated toilet seat or a handicapped toilet? No   TIMED UP  AND GO:  Was the test performed? No . Audio Visit   Cognitive Function:        11/29/2021    1:21 PM 10/11/2019   11:37 AM  6CIT Screen  What Year? 0 points 0 points  What month? 0 points 0 points  What time? 0 points 0 points  Count back from 20 0 points 0 points  Months in reverse 0 points 0 points  Repeat phrase 0 points 2 points  Total Score 0 points 2 points    Immunizations Immunization History  Administered Date(s) Administered   Fluad Quad(high Dose 65+) 12/25/2018, 12/16/2019, 01/02/2021   Influenza Whole 02/08/2007, 12/01/2008   Influenza, High Dose Seasonal PF 01/07/2016, 12/17/2016, 12/21/2017   Influenza,inj,Quad PF,6+ Mos 12/21/2012, 10/26/2013   Influenza-Unspecified 12/17/2016   Moderna Covid-19 Vaccine Bivalent Booster 3yr & up 01/05/2021   Moderna SARS-COV2 Booster Vaccination 02/06/2020, 06/19/2020   Moderna Sars-Covid-2 Vaccination 05/13/2019, 06/13/2019   Pneumococcal Conjugate-13 01/07/2016   Pneumococcal Polysaccharide-23 12/17/2016   Tdap 03/06/2021   Zoster Recombinat (Shingrix) 03/06/2021, 05/06/2021    TDAP status: Up to date  Flu Vaccine status: Up to date  Pneumococcal vaccine status: Up to date  Covid-19 vaccine status: Completed vaccines  Qualifies for Shingles Vaccine? Yes   Zostavax completed Yes   Shingrix  Completed?: Yes  Screening Tests Health Maintenance  Topic Date Due   Diabetic kidney evaluation - Urine ACR  10/20/2014   FOOT EXAM  10/27/2014   OPHTHALMOLOGY EXAM  05/21/2016   INFLUENZA VACCINE  06/01/2022 (Originally 10/01/2021)   COVID-19 Vaccine (4 - Moderna series) 11/01/2022 (Originally 05/05/2021)   HEMOGLOBIN A1C  04/10/2022   Diabetic kidney evaluation - GFR measurement  10/09/2022   MAMMOGRAM  04/11/2023   COLONOSCOPY (Pts 45-49yrInsurance coverage will need to be confirmed)  11/23/2026   TETANUS/TDAP  03/07/2031   Pneumonia Vaccine 6543Years old  Completed   DEXA SCAN  Completed   Hepatitis C Screening  Completed   Zoster Vaccines- Shingrix  Completed   HPV VACCINES  Aged Out    Health Maintenance  Health Maintenance Due  Topic Date Due   Diabetic kidney evaluation - Urine ACR  10/20/2014   FOOT EXAM  10/27/2014   OPHTHALMOLOGY EXAM  05/21/2016    Colorectal cancer screening: Type of screening: Colonoscopy. Completed 11/23/19. Repeat every 7 years  Mammogram status: Completed 04/10/21. Repeat every year  Bone Density status: Completed 01/27/18. Results reflect: Bone density results: OSTEOPOROSIS. Repeat every   years.  Lung Cancer Screening: (Low Dose CT Chest recommended if Age 75-80ears, 30 pack-year currently smoking OR have quit w/in 15years.) does qualify.   Lung Cancer Screening Referral: Patient deferred  Additional Screening:  Hepatitis C Screening: does qualify; Completed 05/18/14  Vision Screening: Recommended annual ophthalmology exams for early detection of glaucoma and other disorders of the eye. Is the patient up to date with their annual eye exam?  Yes  Who is the provider or what is the name of the office in which the patient attends annual eye exams? Dr BoTwanna Hyf pt is not established with a provider, would they like to be referred to a provider to establish care? No .   Dental Screening: Recommended annual dental exams for proper oral  hygiene  Community Resource Referral / Chronic Care Management:  CRR required this visit?  No   CCM required this visit?  No      Plan:     I have personally reviewed and noted the  following in the patient's chart:   Medical and social history Use of alcohol, tobacco or illicit drugs  Current medications and supplements including opioid prescriptions. Patient is currently taking opioid prescriptions. Information provided to patient regarding non-opioid alternatives. Patient advised to discuss non-opioid treatment plan with their provider. Functional ability and status Nutritional status Physical activity Advanced directives List of other physicians Hospitalizations, surgeries, and ER visits in previous 12 months Vitals Screenings to include cognitive, depression, and falls Referrals and appointments  In addition, I have reviewed and discussed with patient certain preventive protocols, quality metrics, and best practice recommendations. A written personalized care plan for preventive services as well as general preventive health recommendations were provided to patient.     [name], LPN   [id number]   Nurse Notes: Patient due Diabetic Kidney evaluatin- Urine ARC

## 2021-12-12 ENCOUNTER — Emergency Department (HOSPITAL_COMMUNITY): Payer: Medicare HMO

## 2021-12-12 ENCOUNTER — Other Ambulatory Visit: Payer: Self-pay

## 2021-12-12 ENCOUNTER — Encounter (HOSPITAL_COMMUNITY): Payer: Self-pay

## 2021-12-12 ENCOUNTER — Emergency Department (HOSPITAL_COMMUNITY)
Admission: EM | Admit: 2021-12-12 | Discharge: 2021-12-13 | Disposition: A | Discharge status: Home / Self Care | Payer: Medicare HMO | Attending: Emergency Medicine | Admitting: Emergency Medicine

## 2021-12-12 DIAGNOSIS — M545 Low back pain, unspecified: Secondary | ICD-10-CM | POA: Diagnosis not present

## 2021-12-12 DIAGNOSIS — Z041 Encounter for examination and observation following transport accident: Secondary | ICD-10-CM | POA: Diagnosis not present

## 2021-12-12 DIAGNOSIS — F17200 Nicotine dependence, unspecified, uncomplicated: Secondary | ICD-10-CM | POA: Insufficient documentation

## 2021-12-12 DIAGNOSIS — S39012A Strain of muscle, fascia and tendon of lower back, initial encounter: Secondary | ICD-10-CM | POA: Insufficient documentation

## 2021-12-12 DIAGNOSIS — S0990XA Unspecified injury of head, initial encounter: Secondary | ICD-10-CM | POA: Diagnosis not present

## 2021-12-12 DIAGNOSIS — I10 Essential (primary) hypertension: Secondary | ICD-10-CM | POA: Insufficient documentation

## 2021-12-12 DIAGNOSIS — R1030 Lower abdominal pain, unspecified: Secondary | ICD-10-CM | POA: Diagnosis not present

## 2021-12-12 DIAGNOSIS — E119 Type 2 diabetes mellitus without complications: Secondary | ICD-10-CM | POA: Insufficient documentation

## 2021-12-12 DIAGNOSIS — M549 Dorsalgia, unspecified: Secondary | ICD-10-CM | POA: Diagnosis present

## 2021-12-12 DIAGNOSIS — N281 Cyst of kidney, acquired: Secondary | ICD-10-CM | POA: Diagnosis not present

## 2021-12-12 DIAGNOSIS — R519 Headache, unspecified: Secondary | ICD-10-CM | POA: Insufficient documentation

## 2021-12-12 DIAGNOSIS — M4316 Spondylolisthesis, lumbar region: Secondary | ICD-10-CM | POA: Diagnosis not present

## 2021-12-12 DIAGNOSIS — Y900 Blood alcohol level of less than 20 mg/100 ml: Secondary | ICD-10-CM | POA: Diagnosis not present

## 2021-12-12 DIAGNOSIS — S161XXA Strain of muscle, fascia and tendon at neck level, initial encounter: Secondary | ICD-10-CM | POA: Insufficient documentation

## 2021-12-12 DIAGNOSIS — K449 Diaphragmatic hernia without obstruction or gangrene: Secondary | ICD-10-CM | POA: Diagnosis not present

## 2021-12-12 DIAGNOSIS — Y92410 Unspecified street and highway as the place of occurrence of the external cause: Secondary | ICD-10-CM | POA: Diagnosis not present

## 2021-12-12 DIAGNOSIS — M7989 Other specified soft tissue disorders: Secondary | ICD-10-CM | POA: Diagnosis not present

## 2021-12-12 LAB — CBC
HCT: 43.4 % (ref 36.0–46.0)
Hemoglobin: 13.8 g/dL (ref 12.0–15.0)
MCH: 25.9 pg — ABNORMAL LOW (ref 26.0–34.0)
MCHC: 31.8 g/dL (ref 30.0–36.0)
MCV: 81.6 fL (ref 80.0–100.0)
Platelets: 194 10*3/uL (ref 150–400)
RBC: 5.32 MIL/uL — ABNORMAL HIGH (ref 3.87–5.11)
RDW: 17.8 % — ABNORMAL HIGH (ref 11.5–15.5)
WBC: 6.6 10*3/uL (ref 4.0–10.5)
nRBC: 0 % (ref 0.0–0.2)

## 2021-12-12 LAB — I-STAT CHEM 8, ED
BUN: 18 mg/dL (ref 8–23)
Calcium, Ion: 1.18 mmol/L (ref 1.15–1.40)
Chloride: 106 mmol/L (ref 98–111)
Creatinine, Ser: 0.8 mg/dL (ref 0.44–1.00)
Glucose, Bld: 104 mg/dL — ABNORMAL HIGH (ref 70–99)
HCT: 44 % (ref 36.0–46.0)
Hemoglobin: 15 g/dL (ref 12.0–15.0)
Potassium: 3.9 mmol/L (ref 3.5–5.1)
Sodium: 142 mmol/L (ref 135–145)
TCO2: 27 mmol/L (ref 22–32)

## 2021-12-12 LAB — COMPREHENSIVE METABOLIC PANEL
ALT: 16 U/L (ref 0–44)
AST: 21 U/L (ref 15–41)
Albumin: 3.7 g/dL (ref 3.5–5.0)
Alkaline Phosphatase: 63 U/L (ref 38–126)
Anion gap: 10 (ref 5–15)
BUN: 14 mg/dL (ref 8–23)
CO2: 25 mmol/L (ref 22–32)
Calcium: 9.6 mg/dL (ref 8.9–10.3)
Chloride: 106 mmol/L (ref 98–111)
Creatinine, Ser: 0.88 mg/dL (ref 0.44–1.00)
GFR, Estimated: >60 mL/min (ref >60)
Glucose, Bld: 109 mg/dL — ABNORMAL HIGH (ref 70–99)
Potassium: 3.9 mmol/L (ref 3.5–5.1)
Sodium: 141 mmol/L (ref 135–145)
Total Bilirubin: 0.6 mg/dL (ref 0.3–1.2)
Total Protein: 7.6 g/dL (ref 6.5–8.1)

## 2021-12-12 LAB — LACTIC ACID, PLASMA: Lactic Acid, Venous: 0.8 mmol/L (ref 0.5–1.9)

## 2021-12-12 LAB — ETHANOL: Alcohol, Ethyl (B): <10 mg/dL (ref <10)

## 2021-12-12 LAB — SAMPLE TO BLOOD BANK

## 2021-12-12 LAB — CBG MONITORING, ED: Glucose-Capillary: 98 mg/dL (ref 70–99)

## 2021-12-12 MED ORDER — IOHEXOL 350 MG/ML SOLN
75.0000 mL | Freq: Once | INTRAVENOUS | Status: AC | PRN
  Administered 2021-12-12: 75 mL via INTRAVENOUS

## 2021-12-12 MED ORDER — ACETAMINOPHEN 325 MG PO TABS: 650.0000 mg | ORAL_TABLET | Freq: Once | ORAL | Status: DC
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
## 2021-12-13 MED ORDER — DICLOFENAC SODIUM 1 % EX GEL: 2.0000 g | Freq: Four times a day (QID) | CUTANEOUS | 0 refills | Status: DC

## 2021-12-13 MED ORDER — HYDROCODONE-ACETAMINOPHEN 5-325 MG PO TABS
2.0000 | ORAL_TABLET | Freq: Once | ORAL | Status: AC
  Administered 2021-12-13: 2 via ORAL
  Filled 2021-12-13: qty 2
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
## 2021-12-16 ENCOUNTER — Encounter: Payer: Self-pay | Admitting: Family Medicine

## 2021-12-16 ENCOUNTER — Ambulatory Visit (INDEPENDENT_AMBULATORY_CARE_PROVIDER_SITE_OTHER): Payer: Medicare HMO | Admitting: Family Medicine

## 2021-12-16 VITALS — BP 136/80 | HR 51 | Temp 98.3°F | Wt 171.0 lb

## 2021-12-16 DIAGNOSIS — S29019D Strain of muscle and tendon of unspecified wall of thorax, subsequent encounter: Secondary | ICD-10-CM

## 2021-12-16 DIAGNOSIS — S161XXD Strain of muscle, fascia and tendon at neck level, subsequent encounter: Secondary | ICD-10-CM | POA: Diagnosis not present

## 2021-12-16 DIAGNOSIS — S39012D Strain of muscle, fascia and tendon of lower back, subsequent encounter: Secondary | ICD-10-CM

## 2021-12-16 MED ORDER — PREGABALIN 300 MG PO CAPS: 300.0000 mg | ORAL_CAPSULE | Freq: Every day | ORAL | 1 refills | Status: DC
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
## 2021-12-18 ENCOUNTER — Telehealth: Payer: Self-pay | Admitting: *Deleted

## 2021-12-26 ENCOUNTER — Telehealth: Payer: Self-pay | Admitting: Family Medicine

## 2021-12-26 NOTE — Telephone Encounter (Signed)
Ok to extend pt time off work for another week? Please advise

## 2021-12-26 NOTE — Telephone Encounter (Signed)
Please write this note

## 2021-12-26 NOTE — Telephone Encounter (Signed)
Patient requesting a work excuse to give her another week off of work as she is still in pain.

## 2021-12-26 NOTE — Telephone Encounter (Signed)
Pt excuse note ready for pick up at the office, pt is aware to pick up at the front office. Letter placed in the cabinet at the front desk

## 2021-12-27 ENCOUNTER — Other Ambulatory Visit: Payer: Self-pay | Admitting: Family Medicine

## 2021-12-27 DIAGNOSIS — E1140 Type 2 diabetes mellitus with diabetic neuropathy, unspecified: Secondary | ICD-10-CM

## 2021-12-27 DIAGNOSIS — K219 Gastro-esophageal reflux disease without esophagitis: Secondary | ICD-10-CM

## 2022-01-01 ENCOUNTER — Emergency Department (HOSPITAL_COMMUNITY)
Admission: EM | Admit: 2022-01-01 | Discharge: 2022-01-02 | Disposition: A | Discharge status: Home / Self Care | Payer: Medicare HMO | Attending: Emergency Medicine | Admitting: Emergency Medicine

## 2022-01-01 ENCOUNTER — Other Ambulatory Visit: Payer: Self-pay

## 2022-01-01 ENCOUNTER — Other Ambulatory Visit: Payer: Self-pay | Admitting: Family Medicine

## 2022-01-01 ENCOUNTER — Encounter (HOSPITAL_COMMUNITY): Payer: Self-pay | Admitting: Emergency Medicine

## 2022-01-01 DIAGNOSIS — Z79899 Other long term (current) drug therapy: Secondary | ICD-10-CM | POA: Insufficient documentation

## 2022-01-01 DIAGNOSIS — Z7984 Long term (current) use of oral hypoglycemic drugs: Secondary | ICD-10-CM | POA: Diagnosis not present

## 2022-01-01 DIAGNOSIS — S060X0A Concussion without loss of consciousness, initial encounter: Secondary | ICD-10-CM | POA: Diagnosis not present

## 2022-01-01 DIAGNOSIS — E119 Type 2 diabetes mellitus without complications: Secondary | ICD-10-CM | POA: Diagnosis not present

## 2022-01-01 DIAGNOSIS — Y92410 Unspecified street and highway as the place of occurrence of the external cause: Secondary | ICD-10-CM | POA: Insufficient documentation

## 2022-01-01 DIAGNOSIS — S060XAA Concussion with loss of consciousness status unknown, initial encounter: Secondary | ICD-10-CM | POA: Insufficient documentation

## 2022-01-01 DIAGNOSIS — S0990XA Unspecified injury of head, initial encounter: Secondary | ICD-10-CM | POA: Diagnosis present

## 2022-01-01 DIAGNOSIS — R42 Dizziness and giddiness: Secondary | ICD-10-CM

## 2022-01-01 DIAGNOSIS — I10 Essential (primary) hypertension: Secondary | ICD-10-CM | POA: Diagnosis not present

## 2022-01-01 MED ORDER — MECLIZINE HCL 25 MG PO TABS
25.0000 mg | ORAL_TABLET | Freq: Once | ORAL | Status: AC
  Administered 2022-01-01: 25 mg via ORAL
  Filled 2022-01-01: qty 1

## 2022-01-01 MED ORDER — ONDANSETRON 8 MG PO TBDP
8.0000 mg | ORAL_TABLET | Freq: Once | ORAL | Status: AC
  Administered 2022-01-02: 8 mg via ORAL
  Filled 2022-01-01: qty 1
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
## 2022-01-02 ENCOUNTER — Telehealth: Payer: Self-pay | Admitting: Family Medicine

## 2022-01-02 NOTE — Telephone Encounter (Signed)
Pt is calling and she has found hydrocodone 10-325 mg at  [facility], [place] Phone: [phone]  Fax: [phone]    Please send
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
## 2022-01-02 NOTE — Telephone Encounter (Signed)
Pharmacy updated.

## 2022-01-03 MED ORDER — HYDROCODONE-ACETAMINOPHEN 10-325 MG PO TABS: 1.0000 | ORAL_TABLET | Freq: Four times a day (QID) | ORAL | 0 refills | Status: AC | PRN

## 2022-01-03 NOTE — Telephone Encounter (Signed)
Done

## 2022-01-08 ENCOUNTER — Telehealth: Payer: Self-pay | Admitting: Licensed Clinical Social Worker

## 2022-01-08 ENCOUNTER — Telehealth: Payer: Self-pay

## 2022-01-08 NOTE — Telephone Encounter (Signed)
     Patient  visit on 11/2  at [facility] you been able to follow up with your primary care physician? YES  The patient was or was not able to obtain any needed medicine or equipment. YES   Are there diet recommendations that you are having difficulty following? NA  Patient expresses understanding of discharge instructions and education provided has no other needs at this time. [place], [facility], Care Management  [phone] [address], [place], [place] [address] Phone: [phone] Email: [email or web address]

## 2022-01-09 ENCOUNTER — Telehealth: Payer: Self-pay | Admitting: Pharmacist
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
## 2022-01-09 NOTE — Progress Notes (Signed)
Chronic Care Management Pharmacy Note  01/16/2022 Name:  Wendy [name] MRN:  [id number] DOB:  Jun 03, 1945  Summary: A1c increased and not at goal < 7% Pt was still filling glipizide 5 mg tablet rx Pt had some confusion with her potassium and stopped taking it LDL not at goal < 70  Recommendations/Changes made from today's visit: -Recommended switching Januvia to GLP1 for further A1c lowering and weight loss benefit -Canceled glipizide 35m rx with pharmacy -Resume potassium daily -Recommend increasing atorvastatin to 40 mg daily  Plan: Follow up DM/BP assessment in 2 months Follow up in 4-5 months  Subjective: Wendy RUMPis an 76y.o. year old female who is a primary patient of FLaurey Morale MD.  The CCM team was consulted for assistance with disease management and care coordination needs.    Engaged with patient by telephone for follow up visit in response to provider referral for pharmacy case management and/or care coordination services.   Consent to Services:  The patient was given information about Chronic Care Management services, agreed to services, and gave verbal consent prior to initiation of services.  Please see initial visit note for detailed documentation.   Patient Care Team: FLaurey Morale MD as PCP - General PViona Gilmore [facility] Pharmac[facility] (Pharmacist)  Recent office visits: 12/16/21 [name]: Patient presented for neck muscle strain. Increased Lyrica from 100 mg to 300 mg daily.  11/29/21 BRolene Arbour LPN: Patient presented for AWV.   10/08/21 [name]: Patient presented for DM follow up. A1c increased to 7.5%. Increased glipizide to 10 mg  BID. Follow up in 3 months for repeat A1c.  09/23/21 [name]: Patient presented for pain management follow up. Refilled Norco. Drug monitor panel negative for benzos and positive for opiates and marijuana.   Recent consult visits: None in previous 6 months  Hospital  visits: [facility]11/2/23 [facility].  [facility]12[facility]10/13/23 [facility] Prescribed Voltaren gel.   Objective:  Lab Results  Component Value Date   CREATININE 0.80 12/12/2021   BUN 18 12/12/2021   GFR 77.01 10/08/2021   GFRNONAA >60 12/12/2021   GFRAA >60 12/24/2016   NA 142 12/12/2021   K 3.9 12/12/2021   CALCIUM 9.6 12/12/2021   CO2 25 12/12/2021   GLUCOSE 104 (H) 12/12/2021    Lab Results  Component Value Date/Time   HGBA1C 7.5 (H) 10/08/2021 01:28 PM   HGBA1C 7.3 (A) 03/13/2021 01:53 PM   HGBA1C 11.4 (H) 12/21/2020 02:20 PM   GFR 77.01 10/08/2021 01:28 PM   GFR 84.19 09/18/2020 01:42 PM   MICROALBUR 0.3 10/19/2013 09:44 AM   MICROALBUR 1.2 03/30/2009 03:00 PM    Last diabetic Eye exam:  Lab Results  Component Value Date/Time   HMDIABEYEEXA No Retinopathy 05/22/2015 10:11 AM    Last diabetic Foot exam: No results found for: "HMDIABFOOTEX"   Lab Results  Component Value Date   CHOL 152 10/08/2021   HDL 56.20 10/08/2021   LDLCALC 82 10/08/2021   LDLDIRECT 143.6 06/05/2010   TRIG 68.0 10/08/2021   CHOLHDL 3 10/08/2021       Latest Ref Rng & Units 12/12/2021    6:53 PM 10/08/2021    1:28 PM 09/18/2020    1:42 PM  Hepatic Function  Total Protein 6.5 - 8.1 g/dL 7.6  7.9  6.9   Albumin 3.5 -  5.0 g/dL 3.7  4.1  3.9   AST 15 - 41 U/L _0 ALT 0 - 44 U/L _1 Alk Phosphatase 38 - 126 U/L 63  61  66   Total Bilirubin 0.3 - 1.2 mg/dL 0.6  0.7  0.6   Bilirubin, Direct 0.0 - 0.3 mg/dL  0.1  0.1     Lab Results  Component Value Date/Time   TSH 1.18 10/08/2021 01:28 PM   TSH 1.30 09/18/2020 01:42 PM   FREET4 0.74 09/18/2020 01:42 PM   FREET4 0.74 08/25/2011 12:17 PM       Latest Ref Rng & Units 12/12/2021    7:14 PM 12/12/2021    6:53 PM 10/08/2021    1:28 PM  CBC  WBC 4.0 - 10.5 K/uL  6.6  7.4   Hemoglobin  12.0 - 15.0 g/dL 15.0  13.8  13.6   Hematocrit 36.0 - 46.0 % 44.0  43.4  40.8   Platelets 150 - 400 K/uL  194  215.0     No results found for: "VD25OH"  Clinical ASCVD: No  The ASCVD Risk score (Arnett DK, et al., 2019) failed to calculate for the following reasons:   Unable to determine if patient is Non-Hispanic African American       12/16/2021    2:07 PM 11/29/2021    1:18 PM 10/08/2021    1:33 PM  Depression screen PHQ 2/9  Decreased Interest 3 0 0  Down, Depressed, Hopeless 1 0 0  PHQ - 2 Score 4 0 0  Altered sleeping 2 0 0  Tired, decreased energy 3 0 3  Change in appetite 0 0 0  Feeling bad or failure about yourself  0 0 0  Trouble concentrating 0 0 0  Moving slowly or fidgety/restless 0 0 0  Suicidal thoughts 0 0 0  PHQ-9 Score 9 0 3  Difficult doing work/chores Not difficult at all Not difficult at all Not difficult at all      Social History   Tobacco Use  Smoking Status Some Days   Packs/day: 0.03   Years: 45.00   Total pack years: 1.35   Types: Cigars, Cigarettes  Smokeless Tobacco Never  Tobacco Comments   1 black and mild every 2-3 days- no cigs - doesnt smoke the whole thing    BP Readings from Last 3 Encounters:  01/02/22 (!) 147/77  12/16/21 136/80  12/13/21 (!) 150/92   Pulse Readings from Last 3 Encounters:  01/02/22 84  12/16/21 (!) 51  12/13/21 72   Wt Readings from Last 3 Encounters:  01/02/22 170 lb (77.1 kg)  12/16/21 171 lb (77.6 kg)  12/12/21 167 lb (75.8 kg)   BMI Readings from Last 3 Encounters:  01/02/22 29.18 kg/m  12/16/21 29.35 kg/m  12/12/21 28.67 kg/m    Assessment/Interventions: Review of patient past medical history, allergies, medications, health status, including review of consultants reports, laboratory and other test data, was performed as part of comprehensive evaluation and provision of chronic care management services.   SDOH:  (Social Determinants of Health) assessments and interventions performed: Yes   SDOH Interventions    Flowsheet Row Chronic Care Management from 01/10/2022 in [place] at [place] from 11/29/2021 in [place] at [place] from 11/06/2020 in [place] at [place] from 10/11/2019 in [place] at [place] Management from 08/24/2019 in [place] at [place]  Interventions       Food Insecurity Interventions -- Intervention Not Indicated Intervention Not Indicated Intervention Not Indicated --  Housing Interventions -- Intervention Not Indicated Intervention Not Indicated Intervention Not Indicated --  Transportation Interventions -- Intervention Not Indicated Intervention Not Indicated -- Intervention Not Indicated  Alcohol Usage Interventions -- Intervention Not Indicated (Score <7) -- -- --  Depression Interventions/Treatment  -- -- -- PHQ2-9 Score <4 Follow-up Not Indicated --  Financial Strain Interventions Intervention Not Indicated Intervention Not Indicated Intervention Not Indicated -- Intervention Not Indicated  Physical Activity Interventions -- Intervention Not Indicated Intervention Not Indicated Other (Comments)  [Patient's states that her job is very physical states that she does not exercise outside of work] --  Stress Interventions -- Intervention Not Indicated Intervention Not Indicated Intervention Not Indicated --  Social Connections Interventions -- Intervention Not Indicated Intervention Not Indicated Intervention Not Indicated --       CCM Care Plan  Allergies  Allergen Reactions   Aspirin Nausea And Vomiting   Tramadol Nausea And Vomiting    Medications Reviewed Today     Reviewed by Viona Gilmore, [facility] (Pharmacist) on 01/10/22 at 1312  Med List Status: <None>   Medication Order Taking? Sig Documenting Provider Last Dose Status Informant  ACCU-CHEK GUIDE test strip [id number]  USE TO TEST ONCE DAILY Laurey Morale, MD  Active    ALPRAZolam Duanne Moron) 1 MG tablet [id number]  TAKE 1 TABLET (1 MG TOTAL) BY MOUTH AT BEDTIME AS NEEDED FOR ANXIETY OR SLEEP. Laurey Morale, MD  Active   amLODipine (NORVASC) 10 MG tablet [id number]  TAKE 1 TABLET BY MOUTH EVERY DAY Laurey Morale, MD  Active   atorvastatin (LIPITOR) 10 MG tablet [id number]  TAKE 1 TABLET BY MOUTH EVERY DAY Laurey Morale, MD  Active   b complex vitamins tablet [id number]  Take 1 tablet by mouth daily. [provider]  Active Self  Blood Glucose Monitoring Suppl (ACCU-CHEK GUIDE) w/Device KIT [id number]  Use to test sugars daily. Laurey Morale, MD  Active   Blood Pressure Monitor KIT [id number]  Use to check blood pressure. Laurey Morale, MD  Active   CALCIUM-VITAMIN D PO [id number]  Take 1 tablet by mouth daily as needed (supplemental).  [provider]  Active Self  Cholecalciferol (VITAMIN D3) 20 MCG (800 UNIT) TABS [id number]  Take 1 tablet by mouth daily.  [provider]  Active Self  diclofenac Sodium (VOLTAREN) 1 % GEL [id number]  Apply 2 g topically 4 (four) times daily. Margette Fast, MD  Active   fluticasone [facility]) 50 MCG/ACT nasal spray [id number]  SPRAY 2 SPRAYS INTO EACH NOSTRIL EVERY DAY Laurey Morale, MD  Active   furosemide (LASIX) 20 MG tablet [id number]  Take 1 tablet (20 mg total) by mouth daily as needed (fluid retention). Laurey Morale, MD  Active   Ginkgo Biloba (GNP GINGKO BILOBA EXTRACT PO) [id number]  Take by mouth. [provider]  Active   glipiZIDE (GLUCOTROL) 10 MG tablet [id number]  Take 1 tablet (10 mg total) by mouth 2 (two) times daily before a meal. Laurey Morale, MD  Active     Discontinued 01/10/22 1312 (Dose change)   HYDROcodone-acetaminophen (NORCO) 10-325 MG tablet [id number] Yes Take 1 tablet by mouth every 6 (six) hours as needed for moderate pain. Laurey Morale, MD Taking Active   hydroxychloroquine (PLAQUENIL) 200 MG tablet [id number]  TAKE 1 TABLET BY MOUTH EVERY DAY [name]  A, MD   Active   JANUVIA 100 MG tablet [id number]  TAKE 1 TABLET BY MOUTH EVERY DAY Laurey Morale, MD  Active   Lancets (ACCU-CHEK SOFT [facility]) lancets [id number]  Test once per day and diagnosis code is E 11.9 and dispense for Aviva plus Laurey Morale, MD  Active   levocetirizine (XYZAL) 5 MG tablet [id number]  TAKE 1 TABLET BY MOUTH EVERY DAY IN THE Billee Cashing, MD  Active   metFORMIN (GLUCOPHAGE) 500 MG tablet [id number] Yes Take 1 tablet (500 mg total) by mouth 2 (two) times daily with a meal. Laurey Morale, MD Taking Active   methocarbamol (ROBAXIN) 500 MG tablet [id number]  TAKE 1 TABLET BY MOUTH EVERY 6 HOURS AS NEEDED FOR MUSCLE SPASMS. Laurey Morale, MD  Active   Misc Natural Products [facility] COMPLEX PO) [id number]  Take by mouth. As needed for energy [provider]  Active   naproxen sodium (ALEVE) 220 MG tablet [id number]  Take 220 mg by mouth 2 (two) times daily as needed. [provider]  Active   Omega-3 Fatty Acids (OMEGA 3 PO) [id number]  Take by mouth daily. [provider]  Active   omeprazole (PRILOSEC) 40 MG capsule [id number]  TAKE 1 CAPSULE BY MOUTH EVERY DAY Laurey Morale, MD  Active   ondansetron (ZOFRAN-ODT) 4 MG disintegrating tablet [id number]  TAKE 1 TABLET BY MOUTH EVERY 8 HOURS AS NEEDED FOR NAUSEA AND VOMITING Laurey Morale, MD  Active   OVER THE COUNTER MEDICATION [id number]  Active Liver Daily [provider]  Active   polyethylene glycol powder (GLYCOLAX/MIRALAX) 17 GM/SCOOP powder [id number] Yes Take 1 Container by mouth as needed. [provider] Taking Active   potassium chloride (KLOR-CON) 10 MEQ tablet [id number]  TAKE 1 TABLET BY MOUTH EVERY DAY Laurey Morale, MD  Active   pregabalin (LYRICA) 300 MG capsule [id number]  Take 1 capsule (300 mg total) by mouth at bedtime. Laurey Morale, MD  Active   Psyllium [facility] DAILY FIBER PO) [id number] Yes Take 1 capsule by mouth as needed. [provider] Taking Active              Patient Active Problem List   Diagnosis Date Noted   Type 2 diabetes mellitus with diabetic neuropathy, unspecified ([place]) 12/26/2020   FOOT PAIN 05/23/2009   LOW BACK PAIN SYNDROME 07/21/2007   DEPENDENT EDEMA 07/21/2007   HEPATITIS C 02/08/2007   GERD 11/20/2006   Diabetic neuropathy, type II diabetes mellitus ([place]) 11/05/2006   Essential hypertension 11/05/2006   Rheumatoid arthritis ([place]) 11/05/2006   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    Immunization History  Administered Date(s) Administered   Fluad Quad(high Dose 65+) 12/25/2018, 12/16/2019, 01/02/2021   Influenza Whole 02/08/2007, 12/01/2008   Influenza, High Dose Seasonal PF 01/07/2016, 12/17/2016, 12/21/2017   Influenza,inj,Quad PF,6+ Mos 12/21/2012, 10/26/2013   Influenza-Unspecified 12/17/2016   Moderna Covid-19 Vaccine Bivalent Booster 10yr & up 01/05/2021   Moderna SARS-COV2 Booster Vaccination 02/06/2020, 06/19/2020   Moderna Sars-Covid-2 Vaccination 05/13/2019, 06/13/2019   Pneumococcal Conjugate-13 01/07/2016   Pneumococcal Polysaccharide-23 12/17/2016   Tdap 03/06/2021   Zoster Recombinat (Shingrix) 03/06/2021, 05/06/2021   Patient got sick the last couple of days.  She has been vomiting since yesterday and is still not feeling well. Offered to reschedule and she said she was feeling better.   Patient stated she is having some issues with getting her medications and the right ones with the pharmacy. Called pharmacy to  cancel the glipizide 5 mg rx as they continued to fill that despite having the 10 mg sent in. Patient also stated they told her that her potassium was on backorder or the pharmacy was unable to get. They reported this was not the case and she had already picked it up so it was too soon to fill.  Patient reports her blood sugars are doing better with the change in glipizide but patient is feeling weighted and down with the glipizide.  She does have constipation often. She takes Miralax  sometimes but just about every 2-3 weeks. Recommended stool softener daily as this is more gentle but patient declined and stated she "knows her body" and didn't want to add anything to her bowel regimen.  Conditions to be addressed/monitored:  Hypertension, Hyperlipidemia, Diabetes, Anxiety, Tobacco use, Allergic Rhinitis, and pain, neuropathy, edema  Conditions addressed this visit: Hypertension, diabetes  Care Plan : CCM Pharmacy Care Plan  Updates made by Viona Gilmore, [place] since 01/16/2022 12:00 AM     Problem: Problem: Hypertension, Diabetes, Anxiety, Osteoporosis, Tobacco use, Allergic Rhinitis and pain, neuropathy, edema      Long-Range Goal: Patient-Specific Goal   Start Date: 05/17/2020  Expected End Date: 05/17/2021  Recent Progress: On track  Priority: High  Note:   Current Barriers:  Unable to independently monitor therapeutic efficacy Unable to achieve control of diabetes and blood pressure  Does not adhere to prescribed medication regimen  Pharmacist Clinical Goal(s):  Patient will achieve adherence to monitoring guidelines and medication adherence to achieve therapeutic efficacy achieve control of blood pressure as evidenced by blood pressure readings  through collaboration with PharmD and provider.   Interventions: 1:1 collaboration with Laurey Morale, MD regarding development and update of comprehensive plan of care as evidenced by provider attestation and co-signature Inter-disciplinary care team collaboration (see longitudinal plan of care) Comprehensive medication review performed; medication list updated in electronic medical record  Hypertension (BP goal <140/90) -Uncontrolled -Current treatment: Lisinopril-HCTZ 20-2m, 1 tablet once daily - Appropriate, Effective, Safe, Accessible Furosemide 20 mg 1 tablet daily - Appropriate, Effective, Safe, Accessible -Medications previously tried: none  -Current home readings: 117/77, 113/79, 120/88 (checking  almost every evening) -Current dietary habits: did not discuss -Current exercise habits: walks at work and doing arm rotations and leg rotations; using an exercise book at home from PT -Denies hypotensive/hypertensive symptoms -Educated on Exercise goal of 150 minutes per week; Importance of home blood pressure monitoring; Proper BP monitoring technique; -Counseled to monitor BP at home at least weekly, document, and provide log at future appointments -Counseled on diet and exercise extensively Recommended to continue current medication  Hyperlipidemia: (LDL goal < 70) -Uncontrolled -Current treatment: Atorvastatin 128m 1 tablet once daily - Appropriate, Query effective, Safe, Accessible -Medications previously tried: none  -Current dietary patterns: doesn't eat pork; limits eggs -Current exercise habits: walks at work and doing arm rotations and leg rotations; using an exercise book at home from PT -Educated on Cholesterol goals;  Benefits of statin for ASCVD risk reduction; Importance of limiting foods high in cholesterol; Exercise goal of 150 minutes per week; -Counseled on diet and exercise extensively Recommended to continue current medication Recommended increasing to high intensity statin.    Diabetes (A1c goal <7%) -Uncontrolled -Current medications: Glipizide 44m71m2 tablets twice daily before a meal - Appropriate, Query effective, Safe, Accessible Januvia 100 mg 1 tablet daily - Appropriate, Query effective, Safe, Accessible Metformin 500 mg 1 tablet twice daily - Appropriate, Query effective, Safe,  Accessible -Medications previously tried: metformin (stomach upset), Januvia (low BGs)  -Current home glucose readings fasting glucose: 90, 101 (highest it has been in the evening) - checking 2-3 times a day (morning and evening) post prandial glucose: n/a Bedtime: low 100s -Reports hypoglycemic/hyperglycemic symptoms - just one event of 66 because she didn't eat -Current  meal patterns: cut down on rice and bread and pasta breakfast: ensure  lunch: PB crackers and cheese and bologna; sandwich or crackers  dinner: brussel sprouts, broccoli, green giant mixed veggies snacks: patient was eating more candy; eating more raw vegetables and fruit drinks: water; cranberry juice occasionally; more and water and no fruit juice and soda  -Current exercise: walks at work and doing arm rotations and leg rotations; using an exercise book at home from PT -Educated on A1c and blood sugar goals; Exercise goal of 150 minutes per week; Carbohydrate counting and/or plate method -Counseled to check feet daily and get yearly eye exams -Counseled on diet and exercise extensively Recommended to continue current medication  Edema (Goal: minimize swelling) -Controlled -Current treatment  Furosemide 28m, 1 tablet once daily as needed for fluid retention - Appropriate, Effective, Safe, Accessible Potassium chloride 162m, 1 tablet once daily - Appropriate, Effective, Safe, Accessible  -Medications previously tried: none  -Recommended to continue current medication  Rheumatoid arthritis (Goal: minimize symptoms and manage pain) -Controlled -Current treatment  Hydroxychloroquine 20027m1 tablet once daily - Appropriate, Effective, Safe, Accessible  -Medications previously tried: none  -Recommended to continue current medication  Neuropathy (Goal: minimize pain) -Controlled -Current treatment  Gabapentin 100m8m capsule twice daily - Appropriate, Effective, Safe, Accessible -Medications previously tried: none  -Recommended to continue current medication  Pain (Goal: minimize pain) -Controlled -Current treatment  Hydrocodone/ APAP 10/325mg23mtablet every six hours as needed for moderate pain (takes 3-4x per day)  - Appropriate, Effective, Query Safe, Accessible Methocarbamol 500 mg 1 tablet at bedtime - Appropriate, Effective, Query Safe, Accessible -Medications  previously tried: n/a  -Recommended to continue current medication Counseled on not taking hydrocodone at the same time as alprazolam due to sedation Patient backed down on use of methocarbamol due to sluggishness.  Anxiety (Goal: minimize symptoms) -Controlled -Current treatment: Alprazolam (Xanax) 1mg, 23mablet at bedtime as needed for anxiety or sleep - Appropriate, Effective, Query Safe, Accessible -Medications previously tried/failed: n/a -GAD7: n/a -Educated on Benefits of medication for symptom control Benefits of cognitive-behavioral therapy with or without medication -Recommended to continue current medication  Tobacco use (Goal quit smoking) -Controlled -Previous quit attempts: cold turkeyKuwaitent treatment  No medications -Patient smokes After 30 minutes of waking -Patient triggers include:  habit -On a scale of 1-10, reports MOTIVATION to quit is 10 -On a scale of 1-10, reports CONFIDENCE in quitting is 10 - Patient quit smoking a month ago and congratulated her on this accomplishment -Recommended to continue without smoking  Allergic rhinitis (Goal: minimize symptoms of allergies) -Controlled -Current treatment  levocetirizine 5mg, 155mblet every evening  - Appropriate, Effective, Safe, Accessible  Fluticasone 50mcg/a55masal spray, 2 sprays into each nostril every day - Appropriate, Effective, Safe, Accessible -Medications previously tried: none  -Recommended to continue current medication Counseled on avoiding allergy triggers  GERD (Goal: minimize symptoms) -Controlled -Current treatment  Omeprazole 40 mg 1 capsule daily - Appropriate, Effective, Safe, Accessible -Medications previously tried: none  -Recommended to continue current medication   Health Maintenance -Vaccine gaps: none -Current therapy:  Vitamin B complex 1 tablet daily Ginkgo biloba 1 tablet daily  Ginseng complex daily Active liver daily Vitamin D3 1000 units, 1 tablet once  daily Calcium-mag-zinc daily  -Educated on Herbal supplement research is limited and benefits usually cannot be proven -Patient is satisfied with current therapy and denies issues -Educated on risk vs benefit of supplementation  Patient Goals/Self-Care Activities Patient will:  - take medications as prescribed check glucose daily, document, and provide at future appointments check blood pressure weekly, document, and provide at future appointments  Follow Up Plan: Telephone follow up appointment with care management team member scheduled for: 4-5 months        Medication Assistance: None required.  Patient affirms current coverage meets needs.  Compliance/Adherence/Medication fill history: Care Gaps: Eye exam, urine microalbumin, foot exam, COVID booster, influenza Last BP - 136/80 on 12/16/2021 A1C - 7.5 on 10/08/2021  Star-Rating Drugs: Atorvastatin 64m - last filled [facility]07/2021 [facility] Glipizide 150m- last filled 10/29/2021 [facility] Januvia 10060m last filled [facility]/14/2021 [facility] Metformin 500 mg - last filled [facility]/13/2021 [facility]  Patient's preferred pharmacy is:  [facility] [place] [place] [address]one: [phone]: [phone] pill box? No - reports medications in separate bags. Has own management system.Reports never forgetting to take it.   Pt endorses 100% compliance  We discussed: Current pharmacy is preferred with insurance plan and patient is satisfied with pharmacy services Patient decided to: Continue current medication management strategy  Care Plan and Follow Up Patient Decision:  Patient agrees to Care Plan and Follow-up.  Plan: Telephone follow up appointment with care management team member scheduled for:  4 months  MadJeni SallesharmD BCAWaxhawarmacist LeBAgency  [phone]

## 2022-01-09 NOTE — Chronic Care Management (AMB) (Signed)
    Chronic Care Management Pharmacy Assistant   01/10/2022 APPOINTMENT REMINDER  Called ADDYLIN MANKE, No answer, left message of appointment on 01/10/2022 at 11:30 via telephone visit with [name], Pharm D. Notified to have all medications, supplements, blood pressure and/or blood sugar logs available during appointment and to return call if need to reschedule.  Care Gaps: AWV - scheduled 12/02/2022 Last BP - 136/80 on 12/16/2021 A1C - 7.5 on 10/08/2021 Urine ACR - overdue Foot exam - overdue Eye exam - overdue Flu - postponed  Star Rating Drug: Atorvastatin '10mg'$  - last filled [facility]/2021 [facility] Glipizide '10mg'$  - last filled 10/29/2021 [facility] Januvia '100mg'$  - last filled [facility]/14/2021 [facility] Metformin 500 mg - last filled [facility]/13/2021 [facility]   Any gaps in medications fill history? No  [facility]  Catering manager [phone]
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
## 2022-01-10 ENCOUNTER — Ambulatory Visit (INDEPENDENT_AMBULATORY_CARE_PROVIDER_SITE_OTHER): Payer: Medicare HMO | Admitting: Pharmacist

## 2022-01-10 DIAGNOSIS — E1140 Type 2 diabetes mellitus with diabetic neuropathy, unspecified: Secondary | ICD-10-CM

## 2022-01-10 DIAGNOSIS — I10 Essential (primary) hypertension: Secondary | ICD-10-CM

## 2022-01-15 ENCOUNTER — Ambulatory Visit: Payer: Self-pay | Admitting: Licensed Clinical Social Worker

## 2022-01-16 ENCOUNTER — Telehealth: Payer: Self-pay | Admitting: Licensed Clinical Social Worker

## 2022-01-16 NOTE — Patient Outreach (Signed)
  Care Coordination   01/16/2022 Name: AMERIS AKAMINE MRN: [id number] DOB: 05/25/1945   Care Coordination Outreach Attempts:  An unsuccessful telephone outreach was attempted today to offer the patient information about available care coordination services as a benefit of their health plan.   Follow Up Plan:  Additional outreach attempts will be made to offer the patient care coordination information and services.   Encounter Outcome:  No Answer  Care Coordination Interventions Activated:  No   Care Coordination Interventions:  No, not indicated    Christa See, MSW, [email or web address] Phone [phone] 11:22 AM

## 2022-01-17 NOTE — Patient Instructions (Signed)
Visit Information  Thank you for taking time to visit with me today. Please don't hesitate to contact me if I can be of assistance to you.   Following are the goals we discussed today:   Goals Addressed             This Visit's Progress    Managment of Stress       Care Coordination Interventions: Solution-Focused Strategies employed:  Mindfulness or Relaxation training provided Active listening / Reflection utilized  Emotional Support Provided Verbalization of feelings encouraged  Patient reports difficulty managing pain Patient continues to participate ins sessions with chiropractor. Has a scheduled appt today Pt reports that she is interested in getting an extension from out of work due to ongoing dizziness and headaches LCSW encouraged pt to contact PCP and/or Chiropractor about completing extension ppwk to provide to employer Pt's Neurologist appt is scheduled for 02/04/22 Pt is concerned about stability of job, states she has been there for over 25 years. LCSW informed pt about FMLA and options that may protect her position while she is receiving tx Pt agreed to contact LCSW after her appt today to discuss needs for PCP appt regarding extension         If you are experiencing a Mental Health or [place] or need someone to talk to, please call the Suicide and Crisis Lifeline: 988 call 911   The patient verbalized understanding of instructions, educational materials, and care plan provided today and DECLINED offer to receive copy of patient instructions, educational materials, and care plan.   Christa See, MSW, [email or web address] Phone [phone] 5:54 AM

## 2022-01-17 NOTE — Patient Outreach (Signed)
  Care Coordination   Follow Up Visit Note   01/17/2022 Name: JAVIANA ANWAR MRN: [id number] DOB: 04/03/1945  SHONTAY WALLNER is a 76 y.o. year old female who sees Laurey Morale, MD for primary care. I spoke with  [name] by phone today.  What matters to the patients health and wellness today?  Stress management    Goals Addressed             This Visit's Progress    Managment of Stress       Care Coordination Interventions: Solution-Focused Strategies employed:  Mindfulness or Relaxation training provided Active listening / Reflection utilized  Emotional Support Provided Verbalization of feelings encouraged  Patient reports difficulty managing pain Patient continues to participate ins sessions with chiropractor. Has a scheduled appt today Pt reports that she is interested in getting an extension from out of work due to ongoing dizziness and headaches LCSW encouraged pt to contact PCP and/or Chiropractor about completing extension ppwk to provide to employer Pt's Neurologist appt is scheduled for 02/04/22 Pt is concerned about stability of job, states she has been there for over 25 years. LCSW informed pt about FMLA and options that may protect her position while she is receiving tx Pt agreed to contact LCSW after her appt today to discuss needs for PCP appt regarding extension         SDOH assessments and interventions completed:  No     Care Coordination Interventions Activated:  Yes  Care Coordination Interventions:  Yes, provided   Follow up plan: Follow up call scheduled for 1-2 weeks    Encounter Outcome:  Pt. Visit Completed   Christa See, MSW, [email or web address] Phone [phone] 5:53 AM

## 2022-01-27 ENCOUNTER — Telehealth: Payer: Self-pay | Admitting: Family Medicine
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
## 2022-01-28 NOTE — Telephone Encounter (Signed)
Called pt to check on how she was doing.stated that she feels good, pt says she has not experienced headaches or Dizziness. Advised pt to call the office if has any questions. Verbalized understanding

## 2022-01-28 NOTE — Telephone Encounter (Signed)
Please check on her this morning and let me know how she is doing

## 2022-01-28 NOTE — Telephone Encounter (Signed)
FYI

## 2022-01-30 DIAGNOSIS — I10 Essential (primary) hypertension: Secondary | ICD-10-CM

## 2022-01-30 DIAGNOSIS — E1140 Type 2 diabetes mellitus with diabetic neuropathy, unspecified: Secondary | ICD-10-CM

## 2022-01-31 ENCOUNTER — Other Ambulatory Visit: Payer: Self-pay | Admitting: Family Medicine

## 2022-02-04 ENCOUNTER — Ambulatory Visit: Payer: Medicare HMO | Admitting: Psychiatry

## 2022-02-05 ENCOUNTER — Telehealth: Payer: Self-pay | Admitting: Family Medicine

## 2022-02-05 NOTE — Telephone Encounter (Signed)
Pt requests refill of HYDROcodone-acetaminophen (NORCO) 10-325 MG per tablet be sent to  [id number], [place] Phone: [phone]  Fax: [phone]

## 2022-02-05 NOTE — Telephone Encounter (Addendum)
Last OV-12/16/21 Medication currently not listed on med list  No future OV scheduled.

## 2022-02-06 NOTE — Telephone Encounter (Signed)
Pt called to FU on this request...  and now she is asking for a refill of the  glipiZIDE (GLUCOTROL) 10 MG tablet as well, asking that it be sent to the  [id number] Phone: [phone] Fax: [phone]

## 2022-02-07 MED ORDER — GLIPIZIDE 10 MG PO TABS: 10.0000 mg | ORAL_TABLET | Freq: Two times a day (BID) | ORAL | 3 refills | Status: DC

## 2022-02-07 NOTE — Telephone Encounter (Signed)
Spoke with patient,  prescription for Glipizide is sent to the pharmacy.   PMV is required for refill for Hydrocodone.   Pt stated she will call back to schedule appointment.

## 2022-02-07 NOTE — Telephone Encounter (Signed)
I refilled the Glipizide, but she needs another PMV for the hydrocodone

## 2022-02-07 NOTE — Telephone Encounter (Signed)
Patient called to follow up on refills. I let patient know that we were still waiting to hear something back from Dr.Fry. Patient stated she is out of her Glipizide.    Patient would like Hydrocodone to go to [facility] [address] and [facility] on [address]

## 2022-02-14 ENCOUNTER — Encounter: Payer: Self-pay | Admitting: Family Medicine

## 2022-02-14 ENCOUNTER — Ambulatory Visit (INDEPENDENT_AMBULATORY_CARE_PROVIDER_SITE_OTHER): Payer: Medicare HMO | Admitting: Family Medicine

## 2022-02-14 VITALS — BP 130/80 | Temp 98.1°F | Wt 175.8 lb

## 2022-02-14 DIAGNOSIS — M5440 Lumbago with sciatica, unspecified side: Secondary | ICD-10-CM | POA: Diagnosis not present

## 2022-02-14 DIAGNOSIS — F1190 Opioid use, unspecified, uncomplicated: Secondary | ICD-10-CM | POA: Diagnosis not present

## 2022-02-14 DIAGNOSIS — G8929 Other chronic pain: Secondary | ICD-10-CM | POA: Diagnosis not present

## 2022-02-14 MED ORDER — HYDROCODONE-ACETAMINOPHEN 10-325 MG PO TABS: 1.0000 | ORAL_TABLET | Freq: Four times a day (QID) | ORAL | 0 refills | Status: DC | PRN

## 2022-02-14 MED ORDER — DULOXETINE HCL 30 MG PO CPEP: 30.0000 mg | ORAL_CAPSULE | Freq: Every day | ORAL | 3 refills | Status: DC
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
## 2022-03-17 ENCOUNTER — Encounter: Payer: Self-pay | Admitting: Sports Medicine

## 2022-03-17 ENCOUNTER — Ambulatory Visit (INDEPENDENT_AMBULATORY_CARE_PROVIDER_SITE_OTHER): Payer: Medicare HMO | Admitting: Family Medicine

## 2022-03-17 ENCOUNTER — Ambulatory Visit (INDEPENDENT_AMBULATORY_CARE_PROVIDER_SITE_OTHER): Payer: Medicare HMO | Admitting: Sports Medicine

## 2022-03-17 ENCOUNTER — Encounter: Payer: Self-pay | Admitting: Family Medicine

## 2022-03-17 VITALS — BP 130/100 | HR 82 | Temp 98.2°F | Ht 64.0 in | Wt 174.0 lb

## 2022-03-17 DIAGNOSIS — Z981 Arthrodesis status: Secondary | ICD-10-CM | POA: Diagnosis not present

## 2022-03-17 DIAGNOSIS — M47816 Spondylosis without myelopathy or radiculopathy, lumbar region: Secondary | ICD-10-CM | POA: Diagnosis not present

## 2022-03-17 DIAGNOSIS — M545 Low back pain, unspecified: Secondary | ICD-10-CM

## 2022-03-17 DIAGNOSIS — G8929 Other chronic pain: Secondary | ICD-10-CM

## 2022-03-17 DIAGNOSIS — S90822A Blister (nonthermal), left foot, initial encounter: Secondary | ICD-10-CM

## 2022-03-17 MED ORDER — HYDROXYCHLOROQUINE SULFATE 200 MG PO TABS: 200.0000 mg | ORAL_TABLET | Freq: Every day | ORAL | 3 refills | Status: DC

## 2022-03-17 MED ORDER — MELOXICAM 15 MG PO TABS: 15.0000 mg | ORAL_TABLET | Freq: Every day | ORAL | 1 refills | Status: DC

## 2022-03-17 MED ORDER — CEPHALEXIN 500 MG PO CAPS: 500.0000 mg | ORAL_CAPSULE | Freq: Three times a day (TID) | ORAL | 0 refills | Status: AC
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
## 2022-03-17 NOTE — Progress Notes (Signed)
   Subjective:    Patient ID: Wendy [name], female    DOB: 08/22/1945, 77 y.o.   MRN: [id number]  HPI Here for a sore blister on the left heel that appeared 5 days ago. She sterilized a pin and popped it to release clear fluid. She does not know how it got there. She denies any numbness in her feet.    Review of Systems  Constitutional: Negative.   Respiratory: Negative.    Cardiovascular: Negative.   Skin:  Positive for wound.       Objective:   Physical Exam Constitutional:      Comments: In a wheelchair   Cardiovascular:     Rate and Rhythm: Normal rate and regular rhythm.     Pulses: Normal pulses.     Heart sounds: Normal heart sounds.  Pulmonary:     Effort: Pulmonary effort is normal.     Breath sounds: Normal breath sounds.  Skin:    Comments: There is a flat 1.5 cm blister on the bottom of the left heel. No signs of infection   Neurological:     Mental Status: She is alert.           Assessment & Plan:  Blister. We will cover this with 10 days of Cephalexin. Recheck as needed.  [name], MD
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
## 2022-03-18 ENCOUNTER — Ambulatory Visit (INDEPENDENT_AMBULATORY_CARE_PROVIDER_SITE_OTHER): Payer: Medicare HMO

## 2022-03-18 ENCOUNTER — Encounter: Payer: Self-pay | Admitting: Orthopedic Surgery

## 2022-03-18 ENCOUNTER — Ambulatory Visit (INDEPENDENT_AMBULATORY_CARE_PROVIDER_SITE_OTHER): Payer: Medicare HMO | Admitting: Orthopedic Surgery

## 2022-03-18 VITALS — BP 138/77 | HR 91 | Ht 64.0 in | Wt 174.0 lb

## 2022-03-18 DIAGNOSIS — M545 Low back pain, unspecified: Secondary | ICD-10-CM

## 2022-03-18 DIAGNOSIS — M48062 Spinal stenosis, lumbar region with neurogenic claudication: Secondary | ICD-10-CM | POA: Diagnosis not present

## 2022-03-18 DIAGNOSIS — G8929 Other chronic pain: Secondary | ICD-10-CM | POA: Diagnosis not present
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
## 2022-03-26 ENCOUNTER — Telehealth: Payer: Self-pay | Admitting: Orthopedic Surgery

## 2022-03-26 ENCOUNTER — Ambulatory Visit: Payer: Medicare HMO | Admitting: Physical Therapy

## 2022-03-26 ENCOUNTER — Other Ambulatory Visit: Payer: Self-pay | Admitting: Family Medicine

## 2022-03-26 ENCOUNTER — Other Ambulatory Visit: Payer: Self-pay

## 2022-03-26 ENCOUNTER — Encounter: Payer: Self-pay | Admitting: Physical Therapy

## 2022-03-26 DIAGNOSIS — R262 Difficulty in walking, not elsewhere classified: Secondary | ICD-10-CM

## 2022-03-26 DIAGNOSIS — M6281 Muscle weakness (generalized): Secondary | ICD-10-CM

## 2022-03-26 DIAGNOSIS — M5459 Other low back pain: Secondary | ICD-10-CM | POA: Diagnosis not present

## 2022-03-26 NOTE — Telephone Encounter (Signed)
I called and lmom that we could do a note for Light duty- Advised her that to call us back to let us know if this will work for her.
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
## 2022-03-26 NOTE — Addendum Note (Signed)
Addended by: Debbe Odea on: 03/26/2022 03:43 PM   Modules accepted: Orders

## 2022-03-26 NOTE — Telephone Encounter (Signed)
Patient was here she would like a note to be out of work until she is seen by Dr Laurance Flatten again. Her call back number is [phone]

## 2022-04-01 ENCOUNTER — Telehealth: Payer: Self-pay | Admitting: Orthopedic Surgery

## 2022-04-01 ENCOUNTER — Encounter: Payer: Self-pay | Admitting: Radiology

## 2022-04-01 NOTE — Telephone Encounter (Signed)
Note written and advised that it will be at the front desk down stairs

## 2022-04-01 NOTE — Telephone Encounter (Signed)
Pt called and states they is no light work and asking for revised letter to put her out of work until her visit with Dr. Laurance Flatten. Please call pt at [phone]

## 2022-04-03 ENCOUNTER — Ambulatory Visit (INDEPENDENT_AMBULATORY_CARE_PROVIDER_SITE_OTHER): Payer: Medicare HMO | Admitting: Rehabilitative and Restorative Service Providers"

## 2022-04-03 ENCOUNTER — Encounter: Payer: Self-pay | Admitting: Rehabilitative and Restorative Service Providers"

## 2022-04-03 DIAGNOSIS — R262 Difficulty in walking, not elsewhere classified: Secondary | ICD-10-CM | POA: Diagnosis not present

## 2022-04-03 DIAGNOSIS — M6281 Muscle weakness (generalized): Secondary | ICD-10-CM | POA: Diagnosis not present

## 2022-04-03 DIAGNOSIS — M5459 Other low back pain: Secondary | ICD-10-CM | POA: Diagnosis not present
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
## 2022-04-07 ENCOUNTER — Telehealth: Payer: Self-pay

## 2022-04-07 NOTE — Progress Notes (Unsigned)
Care Management & Coordination Services Pharmacy Team  Reason for Encounter: Hypertension and Diabetes  Contacted patient to discuss hypertension disease state. {US HC Outreach:[id number]}  Current antihyperglycemic regimen:  Glipizide 10 mg twice daily Januvia 100 mg daily Metformin 500 mg twice daily  Patient verbally confirms she is taking the above medications as directed. {yes/no:20286}   Current antihypertensive regimen:  Amlodipine 10 mg daily Lasix 20 mg as needed for fluid retension Patient verbally confirms she is taking the above medications as directed. {yes/no:20286}  How often are you checking your Blood Pressure? {CHL HP BP Monitoring [phone]  she checks her blood pressure {timing:25218} {before/after:25217} taking her medication.  Current home BP readings: ***  DATE:             BP               PULSE   Wrist or arm cuff:  OTC medications including pseudoephedrine or NSAIDs?  Any readings above 180/100? {yes/no:20286} If yes any symptoms of hypertensive emergency? {hypertensive emergency symptoms:25354}  What recent interventions/DTPs have been made by any provider to improve Blood Pressure control since last CPP Visit: No recent interventions noted  Any recent hospitalizations or ED visits since last visit with CPP? No recent hospital visits noted.   What diet changes have been made to improve Blood Pressure and Blood Sugar Control?  Dietary changes Breakfast - patient will have Lunch - patient will have Dinner - patient will have Caffeine intake: Salt intake:  What exercise is being done to improve your Blood Pressure Control?  ***   Patient {reports/denies:24182} hypoglycemic symptoms, including {Hypoglycemic Symptoms:3049003}  Patient {reports/denies:24182} hyperglycemic symptoms, including {symptoms; hyperglycemia:17903}  How often are you checking your blood sugar? {BG Testing frequency:23922}  What are your blood sugars ranging?   Fasting: *** Before meals: *** After meals: *** Bedtime: ***  During the week, how often does your blood glucose drop below 70? {LowBGfrequency:24142}  Are you checking your feet daily/regularly? {yes/no:20286}  Adherence Review: Is the patient currently on a STATIN medication? Yes Is the patient currently on ACE/ARB medication? No Does the patient have >5 day gap between last estimated fill dates? No   Chart Updates:  Recent office visits:  03/17/2022 [name] MD - Patient was seen for Blister of left heel, initial encounter. Started Cephalexin 500 mg three times daily.   02/14/2022 [name] MD - Patient was seen for chronic narcotic use and an additional concern. Started Duloxetine 30 mg daily and Norco 10/325 mg 1 tablet q 6 hours prn.   Recent consult visits:  03/18/2022 Ileene Rubens MD (ortho surgery) - Patient was seen for chronic bilateral low back pain without sciatica and an additional concern. No medication changes.   03/17/2022 Elba Barman DO (sport med) - Patient was seen for Chronic bilateral low back pain without sciatica and additional concerns. Started Meloxicam 15 mg daily.  Hospital visits:  None   Recent Office Vitals: BP Readings from Last 3 Encounters:  03/18/22 138/77  03/17/22 (!) 130/100  02/14/22 130/80   Pulse Readings from Last 3 Encounters:  03/18/22 91  03/17/22 82  01/02/22 84    Wt Readings from Last 3 Encounters:  03/18/22 174 lb (78.9 kg)  03/17/22 174 lb (78.9 kg)  02/14/22 175 lb 12.8 oz (79.7 kg)     Kidney Function Lab Results  Component Value Date/Time   CREATININE 0.80 12/12/2021 07:14 PM   CREATININE 0.88 12/12/2021 06:53 PM   CREATININE 1.07 (H) 09/16/2019 03:11  PM   CREATININE 0.88 04/18/2016 03:35 PM   GFR 77.01 10/08/2021 01:28 PM   GFRNONAA >60 12/12/2021 06:53 PM   GFRAA >60 12/24/2016 02:03 PM       Latest Ref Rng & Units 12/12/2021    7:14 PM 12/12/2021    6:53 PM 10/08/2021    1:28 PM  BMP   Glucose 70 - 99 mg/dL 104  109  97   BUN 8 - 23 mg/dL '18  14  10   '$ Creatinine 0.44 - 1.00 mg/dL 0.80  0.88  0.76   Sodium 135 - 145 mmol/L 142  141  144   Potassium 3.5 - 5.1 mmol/L 3.9  3.9  4.3   Chloride 98 - 111 mmol/L 106  106  102   CO2 22 - 32 mmol/L  25  30   Calcium 8.9 - 10.3 mg/dL  9.6  9.7      Medications: Outpatient Encounter Medications as of 04/07/2022  Medication Sig   ALPRAZolam (XANAX) 1 MG tablet TAKE 1 TABLET (1 MG TOTAL) BY MOUTH AT BEDTIME AS NEEDED FOR ANXIETY OR SLEEP.   amLODipine (NORVASC) 10 MG tablet TAKE 1 TABLET BY MOUTH EVERY DAY   atorvastatin (LIPITOR) 10 MG tablet TAKE 1 TABLET BY MOUTH EVERY DAY   b complex vitamins tablet Take 1 tablet by mouth daily.   Blood Glucose Monitoring Suppl (ACCU-CHEK GUIDE) w/Device KIT Use to test sugars daily.   Blood Pressure Monitor KIT Use to check blood pressure.   CALCIUM-VITAMIN D PO Take 1 tablet by mouth daily as needed (supplemental).    Cholecalciferol (VITAMIN D3) 20 MCG (800 UNIT) TABS Take 1 tablet by mouth daily.    diclofenac Sodium (VOLTAREN) 1 % GEL Apply 2 g topically 4 (four) times daily.   DULoxetine (CYMBALTA) 30 MG capsule Take 1 capsule (30 mg total) by mouth daily.   fluticasone (FLONASE) 50 MCG/ACT nasal spray SPRAY 2 SPRAYS INTO EACH NOSTRIL EVERY DAY   furosemide (LASIX) 20 MG tablet Take 1 tablet (20 mg total) by mouth daily as needed (fluid retention).   Ginkgo Biloba (GNP GINGKO BILOBA EXTRACT PO) Take by mouth.   glipiZIDE (GLUCOTROL) 10 MG tablet Take 1 tablet (10 mg total) by mouth 2 (two) times daily before a meal.   glucose blood (ACCU-CHEK GUIDE) test strip USE TO TEST ONCE DAILY   [START ON 04/17/2022] HYDROcodone-acetaminophen (NORCO) 10-325 MG tablet Take 1 tablet by mouth every 6 (six) hours as needed for moderate pain.   hydroxychloroquine (PLAQUENIL) 200 MG tablet Take 1 tablet (200 mg total) by mouth daily.   JANUVIA 100 MG tablet TAKE 1 TABLET BY MOUTH EVERY DAY   Lancets  (ACCU-CHEK SOFT TOUCH) lancets Test once per day and diagnosis code is E 11.9 and dispense for Aviva plus   levocetirizine (XYZAL) 5 MG tablet TAKE 1 TABLET BY MOUTH EVERY DAY IN THE EVENING   meloxicam (MOBIC) 15 MG tablet Take 1 tablet (15 mg total) by mouth daily.   metFORMIN (GLUCOPHAGE) 500 MG tablet Take 1 tablet (500 mg total) by mouth 2 (two) times daily with a meal.   methocarbamol (ROBAXIN) 500 MG tablet TAKE 1 TABLET BY MOUTH EVERY 6 HOURS AS NEEDED FOR MUSCLE SPASMS.   Misc Natural Products (GINSENG COMPLEX PO) Take by mouth. As needed for energy   naproxen sodium (ALEVE) 220 MG tablet Take 220 mg by mouth 2 (two) times daily as needed.   Omega-3 Fatty Acids (OMEGA 3 PO) Take by  mouth daily.   omeprazole (PRILOSEC) 40 MG capsule TAKE 1 CAPSULE BY MOUTH EVERY DAY   ondansetron (ZOFRAN-ODT) 4 MG disintegrating tablet TAKE 1 TABLET BY MOUTH EVERY 8 HOURS AS NEEDED FOR NAUSEA AND VOMITING   OVER THE COUNTER MEDICATION Active Liver Daily   polyethylene glycol powder (GLYCOLAX/MIRALAX) 17 GM/SCOOP powder Take 1 Container by mouth as needed.   potassium chloride (KLOR-CON) 10 MEQ tablet TAKE 1 TABLET BY MOUTH EVERY DAY   pregabalin (LYRICA) 300 MG capsule Take 1 capsule (300 mg total) by mouth at bedtime.   Psyllium (ONELAX DAILY FIBER PO) Take 1 capsule by mouth as needed.   RESTASIS 0.05 % ophthalmic emulsion 1 drop 2 (two) times daily.   No facility-administered encounter medications on file as of 04/07/2022.  Fill History:   Dispensed Days Supply Quantity Provider Pharmacy  ALPRAZOLAM 1 MG TABLET 03/21/2022 30 30 each      Dispensed Days Supply Quantity Provider Pharmacy  AMLODIPINE BESYLATE 10 MG TAB 01/08/2022 90 90 each      Dispensed Days Supply Quantity Provider Pharmacy  ATORVASTATIN 10 MG TABLET 02/04/2022 90 90 each      Dispensed Days Supply Quantity Provider Pharmacy  DICLOFENAC SODIUM 1% GEL 12/13/2021 12 100 g      Dispensed Days Supply Quantity Provider Pharmacy   DULOXETINE DR '30MG'$  CAPSULES 03/25/2022 30 30 each      Dispensed Days Supply Quantity Provider Pharmacy  FLUTICASONE PROP 50 MCG SPRAY 02/13/2022 90 48 g      Dispensed Days Supply Quantity Provider Pharmacy  FUROSEMIDE 20 MG TABLET 01/25/2022 90 90 each      Dispensed Days Supply Quantity Provider Pharmacy  GLIPIZIDE 10 MG TABLET 02/07/2022 90 180 each      Dispensed Days Supply Quantity Provider Pharmacy  HYDROCODONE/ACETAMINOPHEN 10-325 T 03/21/2022 30 120 each      Dispensed Days Supply Quantity Provider Pharmacy  HYDROXYCHLOROQUINE '200MG'$  TABLETS 03/17/2022 90 90 each      Dispensed Days Supply Quantity Provider Pharmacy  LEVOCETIRIZINE 5 MG TABLET 02/13/2022 90 90 each      Dispensed Days Supply Quantity Provider Pharmacy  MELOXICAM '15MG'$  TABLETS 03/17/2022 30 30 each      Dispensed Days Supply Quantity Provider Pharmacy  METFORMIN HCL 500 MG TABLET 03/10/2022 90 180 each      Dispensed Days Supply Quantity Provider Pharmacy  METHOCARBAMOL 500 MG TABLET 10/20/2021 30 120 each      Dispensed Days Supply Quantity Provider Pharmacy  OMEPRAZOLE DR 40 MG CAPSULE 03/08/2022 90 90 each      Dispensed Days Supply Quantity Provider Pharmacy  ONDANSETRON ODT 4 MG TABLET 03/26/2022 10 30 each      Dispensed Days Supply Quantity Provider Pharmacy  POTASSIUM CL ER 10 MEQ TABLET 02/04/2022 14 14 each      Dispensed Days Supply Quantity Provider Pharmacy  PREGABALIN 300 MG CAPSULE 12/16/2021 90 90 each      Dispensed Days Supply Quantity Provider Pharmacy  JANUVIA 100 MG TABLET 02/13/2022 90 90 each     Recent Relevant Labs: Lab Results  Component Value Date/Time   HGBA1C 7.5 (H) 10/08/2021 01:28 PM   HGBA1C 7.3 (A) 03/13/2021 01:53 PM   HGBA1C 11.4 (H) 12/21/2020 02:20 PM   MICROALBUR 0.3 10/19/2013 09:44 AM   MICROALBUR 1.2 03/30/2009 03:00 PM    Kidney Function Lab Results  Component Value Date/Time   CREATININE 0.80 12/12/2021 07:14 PM   CREATININE 0.88 12/12/2021  06:53 PM   CREATININE 1.07 (H)  09/16/2019 03:11 PM   CREATININE 0.88 04/18/2016 03:35 PM   GFR 77.01 10/08/2021 01:28 PM   GFRNONAA >60 12/12/2021 06:53 PM   GFRAA >60 12/24/2016 02:03 PM    Star Rating Drugs:  Atorvastatin 10 mg - last filled 02/04/2022 90 [facility] Glipizide 10 mg - last filled 02/13/2022 90 DS at [facility] Januvia 100 mg - last filled 02/13/2022 90 [facility] at [facility] Metformin 500 mg - last filled 03/10/2022 90 DS at [facility]  Care Gaps: AWV - completed 11/29/2021, scheduled 12/02/2022 Last eye exam / retinopathy screening: 05/22/2015, overdue Last diabetic foot exam: 10/27/2023, overdue Urine ACR - overdue Covid - overdue Flu - postponed   [place] Pharmacist Assistant [phone]

## 2022-04-10 ENCOUNTER — Encounter: Payer: Self-pay | Admitting: Rehabilitative and Restorative Service Providers"

## 2022-04-10 ENCOUNTER — Ambulatory Visit (INDEPENDENT_AMBULATORY_CARE_PROVIDER_SITE_OTHER): Payer: Medicare HMO | Admitting: Rehabilitative and Restorative Service Providers"

## 2022-04-10 DIAGNOSIS — M6281 Muscle weakness (generalized): Secondary | ICD-10-CM | POA: Diagnosis not present

## 2022-04-10 DIAGNOSIS — R262 Difficulty in walking, not elsewhere classified: Secondary | ICD-10-CM

## 2022-04-10 DIAGNOSIS — M5459 Other low back pain: Secondary | ICD-10-CM | POA: Diagnosis not present
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
## 2022-04-15 DIAGNOSIS — Z1231 Encounter for screening mammogram for malignant neoplasm of breast: Secondary | ICD-10-CM | POA: Diagnosis not present

## 2022-04-15 LAB — HM MAMMOGRAPHY

## 2022-04-16 ENCOUNTER — Ambulatory Visit (INDEPENDENT_AMBULATORY_CARE_PROVIDER_SITE_OTHER): Payer: Medicare HMO | Admitting: Physical Therapy

## 2022-04-16 ENCOUNTER — Encounter: Payer: Self-pay | Admitting: Physical Therapy

## 2022-04-16 DIAGNOSIS — M6281 Muscle weakness (generalized): Secondary | ICD-10-CM | POA: Diagnosis not present

## 2022-04-16 DIAGNOSIS — M5459 Other low back pain: Secondary | ICD-10-CM

## 2022-04-16 DIAGNOSIS — R262 Difficulty in walking, not elsewhere classified: Secondary | ICD-10-CM
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
## 2022-04-18 ENCOUNTER — Other Ambulatory Visit: Payer: Self-pay | Admitting: Family Medicine

## 2022-04-19 ENCOUNTER — Ambulatory Visit
Admission: RE | Admit: 2022-04-19 | Discharge: 2022-04-19 | Disposition: A | Discharge status: Home / Self Care | Payer: Medicare HMO | Source: Ambulatory Visit | Attending: Orthopedic Surgery | Admitting: Orthopedic Surgery

## 2022-04-19 DIAGNOSIS — M48061 Spinal stenosis, lumbar region without neurogenic claudication: Secondary | ICD-10-CM | POA: Diagnosis not present

## 2022-04-19 DIAGNOSIS — M5416 Radiculopathy, lumbar region: Secondary | ICD-10-CM | POA: Diagnosis not present

## 2022-04-19 DIAGNOSIS — M47816 Spondylosis without myelopathy or radiculopathy, lumbar region: Secondary | ICD-10-CM | POA: Diagnosis not present

## 2022-04-19 DIAGNOSIS — M545 Low back pain, unspecified: Secondary | ICD-10-CM | POA: Diagnosis not present

## 2022-04-19 DIAGNOSIS — M48062 Spinal stenosis, lumbar region with neurogenic claudication: Secondary | ICD-10-CM

## 2022-04-22 NOTE — Telephone Encounter (Signed)
Pt LOV was on 03/17/22 Last refill was done on 10/15/21 Please advise

## 2022-04-23 ENCOUNTER — Ambulatory Visit (INDEPENDENT_AMBULATORY_CARE_PROVIDER_SITE_OTHER): Payer: Medicare HMO

## 2022-04-23 ENCOUNTER — Ambulatory Visit: Payer: Medicare HMO | Admitting: Orthopedic Surgery

## 2022-04-23 DIAGNOSIS — M545 Low back pain, unspecified: Secondary | ICD-10-CM

## 2022-04-23 DIAGNOSIS — Z981 Arthrodesis status: Secondary | ICD-10-CM | POA: Diagnosis not present

## 2022-04-23 DIAGNOSIS — G8929 Other chronic pain: Secondary | ICD-10-CM

## 2022-04-23 DIAGNOSIS — M4036 Flatback syndrome, lumbar region: Secondary | ICD-10-CM

## 2022-04-23 DIAGNOSIS — M48062 Spinal stenosis, lumbar region with neurogenic claudication: Secondary | ICD-10-CM | POA: Diagnosis not present

## 2022-04-23 DIAGNOSIS — M810 Age-related osteoporosis without current pathological fracture: Secondary | ICD-10-CM | POA: Diagnosis not present
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
## 2022-04-23 NOTE — Progress Notes (Signed)
Pre-operative Scores  ODI: 24 VAS back: 6 VAS leg: 8 SF-36:   -Physical functioning: 5  -Role limitations due to physical health: 0  -Role limitations due to emotional problems: 0  -Energy/fatigue: 25  -Emotional well being: 81  -Social functioning: 25  -Pain: 47.5  -General health: [place], MD Orthopedic Surgeon

## 2022-04-26 ENCOUNTER — Other Ambulatory Visit: Payer: Self-pay | Admitting: Family Medicine

## 2022-04-30 NOTE — Telephone Encounter (Signed)
Last refill-06/05/21-30 tabs, 5 refills Last OV-03/17/22  Next OV PMV-05/16/22

## 2022-05-05 ENCOUNTER — Telehealth: Payer: Self-pay | Admitting: Orthopedic Surgery

## 2022-05-05 NOTE — Telephone Encounter (Signed)
Patient states that she needs a note to be written out for work because her job does not allowy her to have limited jobs duty's.

## 2022-05-06 ENCOUNTER — Encounter: Payer: Self-pay | Admitting: Radiology

## 2022-05-06 NOTE — Telephone Encounter (Signed)
I called and advised that the note has been written and that I will put it at the desk downstairs

## 2022-05-09 DIAGNOSIS — Z13820 Encounter for screening for osteoporosis: Secondary | ICD-10-CM | POA: Diagnosis not present

## 2022-05-09 LAB — HM DEXA SCAN: HM Dexa Scan: NORMAL

## 2022-05-12 ENCOUNTER — Other Ambulatory Visit: Payer: Medicare HMO

## 2022-05-12 ENCOUNTER — Encounter: Payer: Self-pay | Admitting: Family Medicine

## 2022-05-13 ENCOUNTER — Other Ambulatory Visit: Payer: Self-pay | Admitting: Family Medicine

## 2022-05-13 NOTE — Progress Notes (Unsigned)
Care Management & Coordination Services Pharmacy Note  05/13/2022 Name:  CHERENA MALAVE MRN:  [id number] DOB:  [birth year]  Summary: ***  Recommendations/Changes made from today's visit: ***  Follow up plan: ***   Subjective: Wendy [name] is an 77 y.o. year old female who is a primary patient of Laurey Morale, MD.  The care coordination team was consulted for assistance with disease management and care coordination needs.    {CCMTELEPHONEFACETOFACE:21091510} for follow up visit.  Recent office visits: 04/15/22 Emmit Pomfret in Radiology - Encounter for [facility]  03/17/2022 [name] MD - Patient was seen for Blister of left heel, initial encounter. Started Cephalexin 500 mg three times daily.    02/14/2022 [name] MD - Patient was seen for chronic narcotic use and an additional concern. Started Duloxetine 30 mg daily and Norco 10/325 mg 1 tablet q 6 hours prn.     Recent consult visits: 03/18/2022 Ileene Rubens MD (ortho surgery) - Patient was seen for chronic bilateral low back pain without sciatica and an additional concern. No medication changes.    03/17/2022 Elba Barman DO (sport med) - Patient was seen for Chronic bilateral low back pain without sciatica and additional concerns. Started Meloxicam 15 mg daily.  Hospital visits: None in previous 6 months   Objective:  Lab Results  Component Value Date   CREATININE 0.80 12/12/2021   BUN 18 12/12/2021   GFR 77.01 10/08/2021   GFRNONAA >60 12/12/2021   GFRAA >60 12/24/2016   NA 142 12/12/2021   K 3.9 12/12/2021   CALCIUM 9.6 12/12/2021   CO2 25 12/12/2021   GLUCOSE 104 (H) 12/12/2021    Lab Results  Component Value Date/Time   HGBA1C 7.5 (H) 10/08/2021 01:28 PM   HGBA1C 7.3 (A) 03/13/2021 01:53 PM   HGBA1C 11.4 (H) 12/21/2020 02:20 PM   GFR 77.01 10/08/2021 01:28 PM   GFR 84.19 09/18/2020 01:42 PM   MICROALBUR 0.3 10/19/2013 09:44 AM   MICROALBUR 1.2 03/30/2009 03:00 PM    Last diabetic  Eye exam:  Lab Results  Component Value Date/Time   HMDIABEYEEXA No Retinopathy 05/22/2015 10:11 AM    Last diabetic Foot exam: No results found for: "HMDIABFOOTEX"   Lab Results  Component Value Date   CHOL 152 10/08/2021   HDL 56.20 10/08/2021   LDLCALC 82 10/08/2021   LDLDIRECT 143.6 06/05/2010   TRIG 68.0 10/08/2021   CHOLHDL 3 10/08/2021       Latest Ref Rng & Units 12/12/2021    6:53 PM 10/08/2021    1:28 PM 09/18/2020    1:42 PM  Hepatic Function  Total Protein 6.5 - 8.1 g/dL 7.6  7.9  6.9   Albumin 3.5 - 5.0 g/dL 3.7  4.1  3.9   AST 15 - 41 U/L '21  20  15   '$ ALT 0 - 44 U/L '16  13  14   '$ Alk Phosphatase 38 - 126 U/L 63  61  66   Total Bilirubin 0.3 - 1.2 mg/dL 0.6  0.7  0.6   Bilirubin, Direct 0.0 - 0.3 mg/dL  0.1  0.1     Lab Results  Component Value Date/Time   TSH 1.18 10/08/2021 01:28 PM   TSH 1.30 09/18/2020 01:42 PM   FREET4 0.74 09/18/2020 01:42 PM   FREET4 0.74 08/25/2011 12:17 PM       Latest Ref Rng & Units 12/12/2021    7:14 PM 12/12/2021    6:53 PM 10/08/2021    1:28  PM  CBC  WBC 4.0 - 10.5 K/uL  6.6  7.4   Hemoglobin 12.0 - 15.0 g/dL 15.0  13.8  13.6   Hematocrit 36.0 - 46.0 % 44.0  43.4  40.8   Platelets 150 - 400 K/uL  194  215.0     No results found for: "VD25OH", "VITAMINB12"  Clinical ASCVD: No  The ASCVD Risk score (Arnett DK, et al., 2019) failed to calculate for the following reasons:   Unable to determine if patient is Non-Hispanic African American         03/17/2022    1:49 PM 02/14/2022    2:52 PM 12/16/2021    2:07 PM  Depression screen PHQ 2/9  Decreased Interest 0 0 3  Down, Depressed, Hopeless 2 0 1  PHQ - 2 Score 2 0 4  Altered sleeping 1 0 2  Tired, decreased energy '3 3 3  '$ Change in appetite 1  0  Feeling bad or failure about yourself  1  0  Trouble concentrating 2  0  Moving slowly or fidgety/restless 0  0  Suicidal thoughts 0  0  PHQ-9 Score '10 3 9  '$ Difficult doing work/chores Extremely dIfficult  Not  difficult at all     Social History   Tobacco Use  Smoking Status Some Days   Packs/day: 0.03   Years: 45.00   Total pack years: 1.35   Types: Cigars, Cigarettes  Smokeless Tobacco Never  Tobacco Comments   1 black and mild every 2-3 days- no cigs - doesnt smoke the whole thing    BP Readings from Last 3 Encounters:  03/18/22 138/77  03/17/22 (!) 130/100  02/14/22 130/80   Pulse Readings from Last 3 Encounters:  03/18/22 91  03/17/22 82  01/02/22 84   Wt Readings from Last 3 Encounters:  03/18/22 174 lb (78.9 kg)  03/17/22 174 lb (78.9 kg)  02/14/22 175 lb 12.8 oz (79.7 kg)   BMI Readings from Last 3 Encounters:  03/18/22 29.87 kg/m  03/17/22 29.87 kg/m  02/14/22 30.18 kg/m    Allergies  Allergen Reactions   Aspirin Nausea And Vomiting   Tramadol Nausea And Vomiting    Medications Reviewed Today     Reviewed by Gennaro Africa, [place], Student-PT (Student-PT) on 04/16/22 at 1307  Med List Status: <None>   Medication Order Taking? Sig Documenting Provider Last Dose Status Informant  ALPRAZolam (XANAX) 1 MG tablet [id number] No TAKE 1 TABLET (1 MG TOTAL) BY MOUTH AT BEDTIME AS NEEDED FOR ANXIETY OR SLEEP. Laurey Morale, MD Taking Active   amLODipine (NORVASC) 10 MG tablet [id number] No TAKE 1 TABLET BY MOUTH EVERY DAY Laurey Morale, MD Taking Active   atorvastatin (LIPITOR) 10 MG tablet [id number] No TAKE 1 TABLET BY MOUTH EVERY DAY Laurey Morale, MD Taking Active   b complex vitamins tablet [id number] No Take 1 tablet by mouth daily. [provider] Taking Active Self  Blood Glucose Monitoring Suppl (ACCU-CHEK GUIDE) w/Device KIT [id number] No Use to test sugars daily. Laurey Morale, MD Taking Active   Blood Pressure Monitor KIT [id number] No Use to check blood pressure. Laurey Morale, MD Taking Active   CALCIUM-VITAMIN D PO [id number] No Take 1 tablet by mouth daily as needed (supplemental).  [provider] Taking Active Self  Cholecalciferol  (VITAMIN D3) 20 MCG (800 UNIT) TABS [id number] No Take 1 tablet by mouth daily.  [provider] Taking Active Self  diclofenac Sodium (VOLTAREN) 1 % GEL [id number]  No Apply 2 g topically 4 (four) times daily. Margette Fast, MD Taking Active   DULoxetine (CYMBALTA) 30 MG capsule [id number] No Take 1 capsule (30 mg total) by mouth daily. Laurey Morale, MD Taking Active   fluticasone [facility]) 50 MCG/ACT nasal spray [id number] No SPRAY 2 SPRAYS INTO EACH NOSTRIL EVERY DAY Laurey Morale, MD Taking Active   furosemide (LASIX) 20 MG tablet [id number] No Take 1 tablet (20 mg total) by mouth daily as needed (fluid retention). Laurey Morale, MD Taking Active   Ginkgo Biloba (GNP GINGKO BILOBA EXTRACT PO) [id number] No Take by mouth. [provider] Taking Active   glipiZIDE (GLUCOTROL) 10 MG tablet [id number] No Take 1 tablet (10 mg total) by mouth 2 (two) times daily before a meal. Laurey Morale, MD Taking Active   glucose blood (ACCU-CHEK GUIDE) test strip [id number]  USE TO TEST ONCE DAILY Laurey Morale, MD  Active   HYDROcodone-acetaminophen [facility]) 10-325 MG tablet [id number] No Take 1 tablet by mouth every 6 (six) hours as needed for moderate pain. Laurey Morale, MD Taking Active   hydroxychloroquine (PLAQUENIL) 200 MG tablet [id number]  Take 1 tablet (200 mg total) by mouth daily. Laurey Morale, MD  Active   JANUVIA 100 MG tablet [id number] No TAKE 1 TABLET BY MOUTH EVERY DAY Laurey Morale, MD Taking Active   Lancets (ACCU-CHEK SOFT [facility]) lancets [id number] No Test once per day and diagnosis code is E 11.9 and dispense for Aviva plus Laurey Morale, MD Taking Active   levocetirizine (XYZAL) 5 MG tablet [id number] No TAKE 1 TABLET BY MOUTH EVERY DAY IN THE Billee Cashing, MD Taking Active   meloxicam (MOBIC) 15 MG tablet [id number]  Take 1 tablet (15 mg total) by mouth daily. Elba Barman, DO  Active   metFORMIN (GLUCOPHAGE) 500 MG tablet [id number] No Take 1 tablet (500 mg total) by  mouth 2 (two) times daily with a meal. Laurey Morale, MD Taking Active   methocarbamol (ROBAXIN) 500 MG tablet [id number] No TAKE 1 TABLET BY MOUTH EVERY 6 HOURS AS NEEDED FOR MUSCLE SPASMS. Laurey Morale, MD Taking Active   Misc Natural Products [facility] COMPLEX PO) [id number] No Take by mouth. As needed for energy [provider] Taking Active   naproxen sodium (ALEVE) 220 MG tablet [id number] No Take 220 mg by mouth 2 (two) times daily as needed. [provider] Taking Active   Omega-3 Fatty Acids (OMEGA 3 PO) [id number] No Take by mouth daily. [provider] Taking Active   omeprazole (PRILOSEC) 40 MG capsule [id number] No TAKE 1 CAPSULE BY MOUTH EVERY DAY Laurey Morale, MD Taking Active   ondansetron (ZOFRAN-ODT) 4 MG disintegrating tablet [id number] No TAKE 1 TABLET BY MOUTH EVERY 8 HOURS AS NEEDED FOR NAUSEA AND VOMITING Laurey Morale, MD Taking Active   OVER THE COUNTER MEDICATION [id number] No Active Liver Daily [provider] Taking Active   polyethylene glycol powder (GLYCOLAX/MIRALAX) 17 GM/SCOOP powder [id number] No Take 1 Container by mouth as needed. [provider] Taking Active   potassium chloride (KLOR-CON) 10 MEQ tablet [id number] No TAKE 1 TABLET BY MOUTH EVERY DAY Laurey Morale, MD Taking Active   pregabalin (LYRICA) 300 MG capsule [id number] No Take 1 capsule (300 mg total) by mouth at bedtime. Laurey Morale, MD Taking Active   Psyllium [facility] DAILY FIBER PO) [id number] No Take 1 capsule by mouth as needed. [provider] Taking Active  RESTASIS 0.05 % ophthalmic emulsion [id number]  1 drop 2 (two) times daily. [provider]  Active             SDOH:  (Social Determinants of Health) assessments and interventions performed: Yes SDOH Interventions    Flowsheet Row Chronic Care Management from 01/10/2022 in [place] at [place] from 11/29/2021 in [place] at [place] from 11/06/2020 in [place] at [place] from 10/11/2019 in [place] at [place] Management from 08/24/2019 in [place] at [place] Interventions -- Intervention Not Indicated Intervention Not Indicated Intervention Not Indicated --  Housing Interventions -- Intervention Not Indicated Intervention Not Indicated Intervention Not Indicated --  Transportation Interventions -- Intervention Not Indicated Intervention Not Indicated -- Intervention Not Indicated  Alcohol Usage Interventions -- Intervention Not Indicated (Score <7) -- -- --  Depression Interventions/Treatment  -- -- -- PHQ2-9 Score <4 Follow-up Not Indicated --  Financial Strain Interventions Intervention Not Indicated Intervention Not Indicated Intervention Not Indicated -- Intervention Not Indicated  Physical Activity Interventions -- Intervention Not Indicated Intervention Not Indicated Other (Comments)  [Patient's states that her job is very physical states that she does not exercise outside of work] --  Stress Interventions -- Intervention Not Indicated Intervention Not Indicated Intervention Not Indicated --  Social Connections Interventions -- Intervention Not Indicated Intervention Not Indicated Intervention Not Indicated --       Medication Assistance: {MEDASSISTANCEINFO:25044}  Medication Access: Within the past 30 days, how often has patient missed a dose of medication? *** Is a pillbox or other method used to improve adherence? {YES/NO:21197} Factors that may affect medication adherence? {CHL DESC; BARRIERS:21522} Are meds synced by current pharmacy? {YES/NO:21197} Are meds delivered by current pharmacy? {YES/NO:21197} Does patient experience delays in picking up medications due to transportation concerns? {YES/NO:21197}  [facility]  Reviewed: Is patient disadvantaged to use [facility]?: {YES/NO:21197} Current Rx insurance plan: [employer] Name and location of Current pharmacy:  [facility] [place], [place] - [address] [place] [address] Phone: [phone] Fax: [phone]  [id number] [address] [address]: [phone]: [phone] [facility] [address]Phone: [phone]: [phone] [facility] services reviewed with patient today?: {YES/NO:21197} Patient requests to transfer care to [facility]?: {YES/NO:21197} Reason patient declined to change pharmacies: {US patient preference:27474}  Compliance/Adherence/Medication fill history: Care Gaps: A1C -  last 10/08/21 Diabetic Kidney Eval (Urine microalbumin - last showing 2015) Diabetic foot exam Covid Vaccine - Booster  Star-Rating Drugs: Atorvastatin '10mg'$  PDC 100% Glipizide '10mg'$  PDC 92% Januvia '100mg'$  PDC 100% Metformin '500mg'$  PDC 100%   Assessment/Plan   Hypertension (BP goal <130/80) -{US controlled/uncontrolled:25276} -Current treatment: Amlodipine '10mg'$  1 qd Lasix '20mg'$  1 daily prn -Medications previously tried: Lisnopril/HCTZ  -Current home readings: *** -Current dietary habits: *** -Current exercise habits: *** -{ACTIONS;DENIES/REPORTS:21021675::"Denies"} hypotensive/hypertensive symptoms -Educated on {CCM BP Counseling:25124} -Counseled to monitor BP at home ***, document, and provide log at future appointments -{CCMPHARMDINTERVENTION:25122}  Diabetes (A1c goal <7%) -Uncontrolled -Current medications: Glipizide '10mg'$  BID Januvia '100mg'$  1 qd Metformin '500mg'$  1 BID -Medications previously tried: ***  -Current  home glucose readings fasting glucose:  65 post prandial glucose: *** -{ACTIONS;DENIES/REPORTS:21021675::"Denies"} hypoglycemic/hyperglycemic symptoms -Current meal patterns:  breakfast: ***  lunch: ***  dinner: *** snacks: *** drinks: *** -Current exercise: *** -Educated on {CCM DM COUNSELING:25123} -Counseled to check feet daily and get yearly eye exams -{CCMPHARMDINTERVENTION:25122}  [facility] Pharmacist [phone]

## 2022-05-15 ENCOUNTER — Telehealth: Payer: Self-pay

## 2022-05-15 NOTE — Progress Notes (Signed)
Patient ID: Wendy [name], female   DOB: February 11, 1946, 77 y.o.   MRN: [id number]  Care Management & Coordination Services Pharmacy Team  Reason for Encounter: Appointment Reminder  Contacted patient to confirm telephone appointment with Theo Dills, PharmD on 05/16/22 at 11:30. Spoke with patient on 05/15/2022     Star Rating Drugs:  Atorvastatin 10 mg - [facility] Glipizide 10 mg - last filled [facility]13[facility] Januvia 100 mg - last filled [facility]/13/2022 [facility] Metformin 500 mg - last filled 03/10/2022 90 DS at [facility]   Care Gaps: Diabetic Urine - Overdue Foot Exam - Overdue Eye Exam - Overdue COVID Booster - Overdue A1C - Overdue Flu Vaccine - Esko Aldrich Clinical Pharmacist Assistant [phone]

## 2022-05-16 ENCOUNTER — Ambulatory Visit (INDEPENDENT_AMBULATORY_CARE_PROVIDER_SITE_OTHER): Payer: Medicare HMO | Admitting: Family Medicine

## 2022-05-16 ENCOUNTER — Encounter: Payer: Self-pay | Admitting: Family Medicine

## 2022-05-16 VITALS — BP 134/82 | HR 86 | Temp 98.4°F | Wt 174.0 lb

## 2022-05-16 DIAGNOSIS — G8929 Other chronic pain: Secondary | ICD-10-CM

## 2022-05-16 DIAGNOSIS — M5440 Lumbago with sciatica, unspecified side: Secondary | ICD-10-CM

## 2022-05-16 DIAGNOSIS — F1190 Opioid use, unspecified, uncomplicated: Secondary | ICD-10-CM | POA: Diagnosis not present

## 2022-05-16 DIAGNOSIS — E1140 Type 2 diabetes mellitus with diabetic neuropathy, unspecified: Secondary | ICD-10-CM

## 2022-05-16 LAB — POCT GLYCOSYLATED HEMOGLOBIN (HGB A1C): Hemoglobin A1C: 6.6 % — AB (ref 4.0–5.6)

## 2022-05-16 MED ORDER — GLIPIZIDE 10 MG PO TABS: 5.0000 mg | ORAL_TABLET | Freq: Two times a day (BID) | ORAL | 3 refills | Status: DC

## 2022-05-16 MED ORDER — HYDROCODONE-ACETAMINOPHEN 10-325 MG PO TABS: 1.0000 | ORAL_TABLET | Freq: Four times a day (QID) | ORAL | 0 refills | Status: DC | PRN

## 2022-05-16 NOTE — Progress Notes (Signed)
   Subjective:    Patient ID: Wendy [name], female    DOB: Jun 20, 1945, 77 y.o.   MRN: [id number]  HPI Here for pain management. Her pain has not changed. She is now scheduled for a lumbar fusion surgery with Dr. Ileene Rubens on 07-29-22. She is also concerned about low blood sugars. Over the past month she has been getting glucose readings in the 50's and 60's in the mornings or late in the evenings. She has been eating something sweet to bring them back up.    Review of Systems  Constitutional: Negative.   Musculoskeletal:  Positive for back pain.       Objective:   Physical Exam Constitutional:      Appearance: Normal appearance.  Neurological:     Mental Status: She is alert.           Assessment & Plan:  Pain management.  Indication for chronic opioid: low back pain Medication and dose: Norco 10-325 # pills per month: 120 Last UDS date: 09-23-21 Opioid Treatment Agreement signed (Y/N): 06-19-17 Opioid Treatment Agreement last reviewed with patient:  05-16-22 NCCSRS reviewed this encounter (include red flags): Yes We refilled the Norco. As for the diabetes, we will decrease the Glipizide to 1/2 tablet (or 5 mg) BID.  [name], MD

## 2022-05-27 ENCOUNTER — Other Ambulatory Visit: Payer: Self-pay | Admitting: Sports Medicine

## 2022-06-06 ENCOUNTER — Other Ambulatory Visit: Payer: Self-pay | Admitting: Family Medicine

## 2022-06-06 ENCOUNTER — Telehealth: Payer: Self-pay | Admitting: Orthopedic Surgery

## 2022-06-06 MED ORDER — DULOXETINE HCL 30 MG PO CPEP: 30.0000 mg | ORAL_CAPSULE | Freq: Every day | ORAL | 3 refills | Status: DC

## 2022-06-06 MED ORDER — ONDANSETRON 4 MG PO TBDP: ORAL_TABLET | ORAL | 5 refills | Status: DC

## 2022-06-06 MED ORDER — PREGABALIN 300 MG PO CAPS: 300.0000 mg | ORAL_CAPSULE | Freq: Every day | ORAL | 1 refills | Status: DC

## 2022-06-06 NOTE — Telephone Encounter (Signed)
Pt requests refills  DULoxetine (CYMBALTA) 30 MG capsule   ALPRAZolam (XANAX) 1 MG tablet  pregabalin (LYRICA) 300 MG capsule  atorvastatin (LIPITOR) 10 MG tablet  ondansetron (ZOFRAN-ODT) 4 MG disintegrating tablet   [id number] [place], [place] - [address] AT [facility] Phone: [phone]  Fax: [phone]

## 2022-06-06 NOTE — Telephone Encounter (Signed)
Patient's DEXA scan reviewed. T score of -0.3. can proceed with surgery at date/time that is convenient for her without need for pre-operative bone density treatment.   London Sheer, MD Orthopedic Surgeon

## 2022-06-06 NOTE — Telephone Encounter (Addendum)
1 year supply for Atorvastatin 10mg   was sent to the pharmacy on 12/27/21  Last PMV-05/16/22 Last OV-03/17/22

## 2022-06-06 NOTE — Addendum Note (Signed)
Addended by: Milus Mallick on: 06/06/2022 04:06 PM   Modules accepted: Orders

## 2022-06-15 ENCOUNTER — Other Ambulatory Visit: Payer: Self-pay | Admitting: Family Medicine

## 2022-06-15 DIAGNOSIS — K219 Gastro-esophageal reflux disease without esophagitis: Secondary | ICD-10-CM

## 2022-06-25 ENCOUNTER — Telehealth: Payer: Self-pay | Admitting: Family Medicine

## 2022-06-25 ENCOUNTER — Other Ambulatory Visit: Payer: Self-pay

## 2022-06-25 DIAGNOSIS — E1140 Type 2 diabetes mellitus with diabetic neuropathy, unspecified: Secondary | ICD-10-CM

## 2022-06-25 MED ORDER — LEVOCETIRIZINE DIHYDROCHLORIDE 5 MG PO TABS: 5.0000 mg | ORAL_TABLET | Freq: Every evening | ORAL | 3 refills | Status: DC

## 2022-06-25 MED ORDER — ACCU-CHEK SOFTCLIX LANCETS MISC: 12 refills | Status: AC

## 2022-06-25 MED ORDER — POTASSIUM CHLORIDE ER 10 MEQ PO TBCR: 10.0000 meq | EXTENDED_RELEASE_TABLET | Freq: Every day | ORAL | 3 refills | Status: DC

## 2022-06-25 MED ORDER — ATORVASTATIN CALCIUM 10 MG PO TABS: 10.0000 mg | ORAL_TABLET | Freq: Every day | ORAL | 3 refills | Status: DC

## 2022-06-25 NOTE — Telephone Encounter (Unsigned)
Prescription Request  06/25/2022  LOV: 05/16/2022  What is the name of the medication or equipment? Atorvastation and potassium chloride ,levocetirizineBlood Glucose Monitoring Suppl (ACCU-CHEK GUIDE) w/Device KIT  Lancets (ACCU-CHEK SOFT TOUCH) lancets  Have you contacted your pharmacy to request a refill? {yes/no:20286}   Which pharmacy would you like this sent to?   [facility] #[id number] - [place], [place] - [address] AT [place] Phone: [phone]  Fax: [phone]      Patient notified that their request is being sent to the clinical staff for review and that they should receive a response within 2 business days.   Please advise at {HOME/MOBILE:28343}

## 2022-06-25 NOTE — Telephone Encounter (Signed)
Pt refills sent

## 2022-07-17 NOTE — Progress Notes (Signed)
Surgical Instructions    Your procedure is scheduled on Tuesday, 07/29/22.  Report to [facility] Main Entrance "A" at 5:30 A.M., then check in with the Admitting office.  Call this number if you have problems the morning of surgery:  [phone]   If you have any questions prior to your surgery date call [phone]: Open Monday-Friday 8am-4pm If you experience any cold or flu symptoms such as cough, fever, chills, shortness of breath, etc. between now and your scheduled surgery, please notify us at the above number     Remember:  Do not eat after midnight the night before your surgery  You may drink clear liquids until 4:30am the morning of your surgery.   Clear liquids allowed are: Water, Non-Citrus Juices (without pulp), Carbonated Beverages, Clear Tea, Black Coffee ONLY (NO MILK, CREAM OR POWDERED CREAMER of any kind), and Gatorade  Patient Instructions  The night before surgery:  No food after midnight. ONLY clear liquids after midnight   The day of surgery (if you have diabetes): Drink ONE (1) 12 oz G2 given to you in your pre admission testing appointment by 4:30am the morning of surgery. Drink in one sitting. Do not sip.  This drink was given to you during your hospital  pre-op appointment visit.  Nothing else to drink after completing the  12 oz bottle of G2.         If you have questions, please contact your surgeon's office.     Take these medicines the morning of surgery with A SIP OF WATER:  amLODipine (NORVASC)  atorvastatin (LIPITOR)  DULoxetine (CYMBALTA)  fluticasone (FLONASE)  hydroxychloroquine (PLAQUENIL)  omeprazole (PRILOSEC)  RESTASIS eye drops  IF NEEDED: ALPRAZolam (XANAX)  HYDROcodone-acetaminophen (NORCO)  methocarbamol (ROBAXIN)  ondansetron (ZOFRAN-ODT)   As of today, STOP taking any Aspirin (unless otherwise instructed by your surgeon) Aleve, Naproxen, Ibuprofen, Motrin, Advil, Goody's, BC's, all herbal medications, fish oil, and all  vitamins. This includes diclofenac Sodium (VOLTAREN) 1 % GEL or meloxicam (MOBIC).  WHAT DO I DO ABOUT MY DIABETES MEDICATION?   Do not take oral diabetes medicines (pills) the morning of surgery.  THE MORNING OF SURGERY, do not take glipiZIDE (GLUCOTROL), JANUVIA or metFORMIN (GLUCOPHAGE).  The day of surgery, do not take other diabetes injectables, including Byetta (exenatide), Bydureon (exenatide ER), Victoza (liraglutide), or Trulicity (dulaglutide).  If your CBG is greater than 220 mg/dL, you may take  of your sliding scale (correction) dose of insulin.   HOW TO MANAGE YOUR DIABETES BEFORE AND AFTER SURGERY  Why is it important to control my blood sugar before and after surgery? Improving blood sugar levels before and after surgery helps healing and can limit problems. A way of improving blood sugar control is eating a healthy diet by:  Eating less sugar and carbohydrates  Increasing activity/exercise  Talking with your doctor about reaching your blood sugar goals High blood sugars (greater than 180 mg/dL) can raise your risk of infections and slow your recovery, so you will need to focus on controlling your diabetes during the weeks before surgery. Make sure that the doctor who takes care of your diabetes knows about your planned surgery including the date and location.  How do I manage my blood sugar before surgery? Check your blood sugar at least 4 times a day, starting 2 days before surgery, to make sure that the level is not too high or low.  Check your blood sugar the morning of your surgery when you wake up and  every 2 hours until you get to the Short Stay unit.  If your blood sugar is less than 70 mg/dL, you will need to treat for low blood sugar: Do not take insulin. Treat a low blood sugar (less than 70 mg/dL) with  cup of clear juice (cranberry or apple), 4 glucose tablets, OR glucose gel. Recheck blood sugar in 15 minutes after treatment (to make sure it is  greater than 70 mg/dL). If your blood sugar is not greater than 70 mg/dL on recheck, call [phone] for further instructions. Report your blood sugar to the short stay nurse when you get to Short Stay.  If you are admitted to the hospital after surgery: Your blood sugar will be checked by the staff and you will probably be given insulin after surgery (instead of oral diabetes medicines) to make sure you have good blood sugar levels. The goal for blood sugar control after surgery is 80-180 mg/dL.            Do not wear jewelry or makeup. Do not wear lotions, powders, perfumes or deodorant. Do not shave 48 hours prior to surgery.   Do not bring valuables to the hospital. Do not wear nail polish, gel polish, artificial nails, or any other type of covering on natural nails (fingers and toes) If you have artificial nails or gel coating that need to be removed by a nail salon, please have this removed prior to surgery. Artificial nails or gel coating may interfere with anesthesia's ability to adequately monitor your vital signs.  [facility] is not responsible for any belongings or valuables.    Do NOT Smoke (Tobacco/Vaping)  24 hours prior to your procedure  If you use a CPAP at night, you may bring your mask for your overnight stay.   Contacts, glasses, hearing aids, dentures or partials may not be worn into surgery, please bring cases for these belongings   For patients admitted to the hospital, discharge time will be determined by your treatment team.   Patients discharged the day of surgery will not be allowed to drive home, and someone needs to stay with them for 24 hours.   SURGICAL WAITING ROOM VISITATION Patients having surgery or a procedure may have no more than 2 support people in the waiting area - these visitors may rotate.   Children under the age of 47 must have an adult with them who is not the patient. If the patient needs to stay at the hospital during part of their  recovery, the visitor guidelines for inpatient rooms apply. Pre-op nurse will coordinate an appropriate time for 1 support person to accompany patient in pre-op.  This support person may not rotate.   Please refer to [email or web address] for the visitor guidelines for Inpatients (after your surgery is over and you are in a regular room).    Special instructions:    Oral Hygiene is also important to reduce your risk of infection.  Remember - BRUSH YOUR TEETH THE MORNING OF SURGERY WITH YOUR REGULAR TOOTHPASTE   [facility]- Preparing For Surgery  Before surgery, you can play an important role. Because skin is not sterile, your skin needs to be as free of germs as possible. You can reduce the number of germs on your skin by washing with CHG (chlorahexidine gluconate) Soap before surgery.  CHG is an antiseptic cleaner which kills germs and bonds with the skin to continue killing germs even after washing.     Please do not use if  you have an allergy to CHG or antibacterial soaps. If your skin becomes reddened/irritated stop using the CHG.  Do not shave (including legs and underarms) for at least 48 hours prior to first CHG shower. It is OK to shave your face.  Please follow these instructions carefully.     Shower the NIGHT BEFORE SURGERY and the MORNING OF SURGERY with CHG Soap.   If you chose to wash your hair, wash your hair first as usual with your normal shampoo. After you shampoo, rinse your hair and body thoroughly to remove the shampoo.  Then [employer] and genitals (private parts) with your normal soap and rinse thoroughly to remove soap.  After that Use CHG Soap as you would any other liquid soap. You can apply CHG directly to the skin and wash gently with a scrungie or a clean washcloth.   Apply the CHG Soap to your body ONLY FROM THE NECK DOWN.  Do not use on open wounds or open sores. Avoid contact with your eyes, ears, mouth and  genitals (private parts). Wash Face and genitals (private parts)  with your normal soap.   Wash thoroughly, paying special attention to the area where your surgery will be performed.  Thoroughly rinse your body with warm water from the neck down.  DO NOT shower/wash with your normal soap after using and rinsing off the CHG Soap.  Pat yourself dry with a CLEAN TOWEL.  Wear CLEAN PAJAMAS to bed the night before surgery  Place CLEAN SHEETS on your bed the night before your surgery  DO NOT SLEEP WITH PETS.   Day of Surgery: Take a shower with CHG soap. Wear Clean/Comfortable clothing the morning of surgery Do not apply any deodorants/lotions.   Remember to brush your teeth WITH YOUR REGULAR TOOTHPASTE.    If you received a COVID test during your pre-op visit, it is requested that you wear a mask when out in public, stay away from anyone that may not be feeling well, and notify your surgeon if you develop symptoms. If you have been in contact with anyone that has tested positive in the last 10 days, please notify your surgeon.    Please read over the following fact sheets that you were given.

## 2022-07-18 ENCOUNTER — Other Ambulatory Visit: Payer: Self-pay

## 2022-07-18 ENCOUNTER — Encounter (HOSPITAL_COMMUNITY): Payer: Self-pay

## 2022-07-18 ENCOUNTER — Encounter (HOSPITAL_COMMUNITY)
Admission: RE | Admit: 2022-07-18 | Discharge: 2022-07-18 | Disposition: A | Discharge status: Home / Self Care | Payer: Medicare HMO | Source: Ambulatory Visit | Attending: Orthopedic Surgery | Admitting: Orthopedic Surgery

## 2022-07-18 VITALS — BP 172/92 | HR 83 | Temp 98.7°F | Resp 18 | Ht 62.0 in | Wt 178.5 lb

## 2022-07-18 DIAGNOSIS — M48062 Spinal stenosis, lumbar region with neurogenic claudication: Secondary | ICD-10-CM | POA: Insufficient documentation

## 2022-07-18 DIAGNOSIS — F17200 Nicotine dependence, unspecified, uncomplicated: Secondary | ICD-10-CM | POA: Insufficient documentation

## 2022-07-18 DIAGNOSIS — Z720 Tobacco use: Secondary | ICD-10-CM | POA: Insufficient documentation

## 2022-07-18 DIAGNOSIS — Z01812 Encounter for preprocedural laboratory examination: Secondary | ICD-10-CM | POA: Insufficient documentation

## 2022-07-18 DIAGNOSIS — E119 Type 2 diabetes mellitus without complications: Secondary | ICD-10-CM | POA: Diagnosis not present

## 2022-07-18 DIAGNOSIS — B1710 Acute hepatitis C without hepatic coma: Secondary | ICD-10-CM | POA: Diagnosis not present

## 2022-07-18 DIAGNOSIS — Z01818 Encounter for other preprocedural examination: Secondary | ICD-10-CM

## 2022-07-18 HISTORY — DX: Anxiety disorder, unspecified: F41.9

## 2022-07-18 LAB — CBC
HCT: 39.5 % (ref 36.0–46.0)
Hemoglobin: 12.4 g/dL (ref 12.0–15.0)
MCH: 25.8 pg — ABNORMAL LOW (ref 26.0–34.0)
MCHC: 31.4 g/dL (ref 30.0–36.0)
MCV: 82.3 fL (ref 80.0–100.0)
Platelets: 238 10*3/uL (ref 150–400)
RBC: 4.8 MIL/uL (ref 3.87–5.11)
RDW: 17.2 % — ABNORMAL HIGH (ref 11.5–15.5)
WBC: 5.5 10*3/uL (ref 4.0–10.5)
nRBC: 0 % (ref 0.0–0.2)

## 2022-07-18 LAB — COMPREHENSIVE METABOLIC PANEL
ALT: 16 U/L (ref 0–44)
AST: 20 U/L (ref 15–41)
Albumin: 3.5 g/dL (ref 3.5–5.0)
Alkaline Phosphatase: 55 U/L (ref 38–126)
Anion gap: 8 (ref 5–15)
BUN: 11 mg/dL (ref 8–23)
CO2: 28 mmol/L (ref 22–32)
Calcium: 9.3 mg/dL (ref 8.9–10.3)
Chloride: 104 mmol/L (ref 98–111)
Creatinine, Ser: 0.87 mg/dL (ref 0.44–1.00)
GFR, Estimated: >60 mL/min (ref >60)
Glucose, Bld: 113 mg/dL — ABNORMAL HIGH (ref 70–99)
Potassium: 3.8 mmol/L (ref 3.5–5.1)
Sodium: 140 mmol/L (ref 135–145)
Total Bilirubin: 0.8 mg/dL (ref 0.3–1.2)
Total Protein: 7.4 g/dL (ref 6.5–8.1)

## 2022-07-18 LAB — HEMOGLOBIN A1C
Hgb A1c MFr Bld: 6.4 % — ABNORMAL HIGH (ref 4.8–5.6)
Mean Plasma Glucose: 136.98 mg/dL

## 2022-07-18 LAB — GLUCOSE, CAPILLARY: Glucose-Capillary: 114 mg/dL — ABNORMAL HIGH (ref 70–99)

## 2022-07-18 LAB — SURGICAL PCR SCREEN
MRSA, PCR: POSITIVE — AB
Staphylococcus aureus: POSITIVE — AB

## 2022-07-18 LAB — TYPE AND SCREEN

## 2022-07-18 NOTE — Progress Notes (Signed)
PCP - Dr. Gershon Crane Cardiologist - denies  PPM/ICD - denies   Chest x-ray - 12/12/21 EKG - 01/01/22 Stress Test - 10+ years ago per pt (at [name]- normal per pt) ECHO - 01/10/2000 Cardiac Cath - denies  Sleep Study - denies   Fasting Blood Sugar - 100-140 Checks Blood Sugar 1-3 times a day  Last dose of GLP1 agonist-  n/a   ASA/Blood Thinner Instructions: n/a   ERAS Protcol - yes PRE-SURGERY G2- given at PAT  COVID TEST- n/a   Anesthesia review: no  Patient denies shortness of breath, fever, cough and chest pain at PAT appointment   All instructions explained to the patient, with a verbal understanding of the material. Patient agrees to go over the instructions while at home for a better understanding.  The opportunity to ask questions was provided.

## 2022-07-18 NOTE — Progress Notes (Signed)
Surgical Instructions    Your procedure is scheduled on Tuesday, 07/29/22.  Report to [facility] Main Entrance "A" at 5:30 A.M., then check in with the Admitting office.  Call this number if you have problems the morning of surgery:  [phone]   If you have any questions prior to your surgery date call [phone]: Open Monday-Friday 8am-4pm If you experience any cold or flu symptoms such as cough, fever, chills, shortness of breath, etc. between now and your scheduled surgery, please notify us at the above number     Remember:  Do not eat after midnight the night before your surgery  You may drink clear liquids until 4:30am the morning of your surgery.   Clear liquids allowed are: Water, Non-Citrus Juices (without pulp), Carbonated Beverages, Clear Tea, Black Coffee ONLY (NO MILK, CREAM OR POWDERED CREAMER of any kind), and Gatorade  Patient Instructions  The night before surgery:  No food after midnight. ONLY clear liquids after midnight   The day of surgery (if you have diabetes): Drink ONE (1) 12 oz G2 given to you in your pre admission testing appointment by 4:30am the morning of surgery. Drink in one sitting. Do not sip.  This drink was given to you during your hospital  pre-op appointment visit.  Nothing else to drink after completing the  12 oz bottle of G2.         If you have questions, please contact your surgeon's office.     Take these medicines the morning of surgery with A SIP OF WATER:  amLODipine (NORVASC)  atorvastatin (LIPITOR)  DULoxetine (CYMBALTA)  omeprazole (PRILOSEC)  RESTASIS eye drops  IF NEEDED: ALPRAZolam (XANAX)  fluticasone (FLONASE)  HYDROcodone-acetaminophen (NORCO)  methocarbamol (ROBAXIN)  ondansetron (ZOFRAN-ODT)   As of today, STOP taking any Aspirin (unless otherwise instructed by your surgeon) Aleve, Naproxen, Ibuprofen, Motrin, Advil, Goody's, BC's, all herbal medications, fish oil, and all vitamins. This includes diclofenac  Sodium (VOLTAREN) 1 % GEL or meloxicam (MOBIC).  WHAT DO I DO ABOUT MY DIABETES MEDICATION?   THE MORNING OF SURGERY, do not take glipiZIDE (GLUCOTROL), JANUVIA or metFORMIN (GLUCOPHAGE).     HOW TO MANAGE YOUR DIABETES BEFORE AND AFTER SURGERY  Why is it important to control my blood sugar before and after surgery? Improving blood sugar levels before and after surgery helps healing and can limit problems. A way of improving blood sugar control is eating a healthy diet by:  Eating less sugar and carbohydrates  Increasing activity/exercise  Talking with your doctor about reaching your blood sugar goals High blood sugars (greater than 180 mg/dL) can raise your risk of infections and slow your recovery, so you will need to focus on controlling your diabetes during the weeks before surgery. Make sure that the doctor who takes care of your diabetes knows about your planned surgery including the date and location.  How do I manage my blood sugar before surgery? Check your blood sugar at least 4 times a day, starting 2 days before surgery, to make sure that the level is not too high or low.  Check your blood sugar the morning of your surgery when you wake up and every 2 hours until you get to the Short Stay unit.  If your blood sugar is less than 70 mg/dL, you will need to treat for low blood sugar: Do not take insulin. Treat a low blood sugar (less than 70 mg/dL) with  cup of clear juice (cranberry or apple), 4 glucose tablets, OR  glucose gel. Recheck blood sugar in 15 minutes after treatment (to make sure it is greater than 70 mg/dL). If your blood sugar is not greater than 70 mg/dL on recheck, call [phone] for further instructions. Report your blood sugar to the short stay nurse when you get to Short Stay.  If you are admitted to the hospital after surgery: Your blood sugar will be checked by the staff and you will probably be given insulin after surgery (instead of oral diabetes  medicines) to make sure you have good blood sugar levels. The goal for blood sugar control after surgery is 80-180 mg/dL.            Do not wear jewelry or makeup. Do not wear lotions, powders, perfumes or deodorant. Do not shave 48 hours prior to surgery.   Do not bring valuables to the hospital. Do not wear nail polish, gel polish, artificial nails, or any other type of covering on natural nails (fingers and toes) If you have artificial nails or gel coating that need to be removed by a nail salon, please have this removed prior to surgery. Artificial nails or gel coating may interfere with anesthesia's ability to adequately monitor your vital signs.  [facility] is not responsible for any belongings or valuables.    Do NOT Smoke (Tobacco/Vaping)  24 hours prior to your procedure  If you use a CPAP at night, you may bring your mask for your overnight stay.   Contacts, glasses, hearing aids, dentures or partials may not be worn into surgery, please bring cases for these belongings   For patients admitted to the hospital, discharge time will be determined by your treatment team.   Patients discharged the day of surgery will not be allowed to drive home, and someone needs to stay with them for 24 hours.   SURGICAL WAITING ROOM VISITATION Patients having surgery or a procedure may have no more than 2 support people in the waiting area - these visitors may rotate.   Children under the age of 33 must have an adult with them who is not the patient. If the patient needs to stay at the hospital during part of their recovery, the visitor guidelines for inpatient rooms apply. Pre-op nurse will coordinate an appropriate time for 1 support person to accompany patient in pre-op.  This support person may not rotate.   Please refer to [email or web address] for the visitor guidelines for Inpatients (after your surgery is over and you are in a regular  room).    Special instructions:    Oral Hygiene is also important to reduce your risk of infection.  Remember - BRUSH YOUR TEETH THE MORNING OF SURGERY WITH YOUR REGULAR TOOTHPASTE   [facility]- Preparing For Surgery  Before surgery, you can play an important role. Because skin is not sterile, your skin needs to be as free of germs as possible. You can reduce the number of germs on your skin by washing with CHG (chlorahexidine gluconate) Soap before surgery.  CHG is an antiseptic cleaner which kills germs and bonds with the skin to continue killing germs even after washing.     Please do not use if you have an allergy to CHG or antibacterial soaps. If your skin becomes reddened/irritated stop using the CHG.  Do not shave (including legs and underarms) for at least 48 hours prior to first CHG shower. It is OK to shave your face.  Please follow these instructions carefully.     Shower the [employer]  BEFORE SURGERY and the MORNING OF SURGERY with CHG Soap.   If you chose to wash your hair, wash your hair first as usual with your normal shampoo. After you shampoo, rinse your hair and body thoroughly to remove the shampoo.  Then [employer] and genitals (private parts) with your normal soap and rinse thoroughly to remove soap.  After that Use CHG Soap as you would any other liquid soap. You can apply CHG directly to the skin and wash gently with a scrungie or a clean washcloth.   Apply the CHG Soap to your body ONLY FROM THE NECK DOWN.  Do not use on open wounds or open sores. Avoid contact with your eyes, ears, mouth and genitals (private parts). Wash Face and genitals (private parts)  with your normal soap.   Wash thoroughly, paying special attention to the area where your surgery will be performed.  Thoroughly rinse your body with warm water from the neck down.  DO NOT shower/wash with your normal soap after using and rinsing off the CHG Soap.  Pat yourself dry with a CLEAN TOWEL.  Wear  CLEAN PAJAMAS to bed the night before surgery  Place CLEAN SHEETS on your bed the night before your surgery  DO NOT SLEEP WITH PETS.   Day of Surgery: Take a shower with CHG soap. Wear Clean/Comfortable clothing the morning of surgery Do not apply any deodorants/lotions.   Remember to brush your teeth WITH YOUR REGULAR TOOTHPASTE.    If you received a COVID test during your pre-op visit, it is requested that you wear a mask when out in public, stay away from anyone that may not be feeling well, and notify your surgeon if you develop symptoms. If you have been in contact with anyone that has tested positive in the last 10 days, please notify your surgeon.    Please read over the following fact sheets that you were given.

## 2022-07-18 NOTE — Telephone Encounter (Signed)
error 

## 2022-07-19 LAB — NICOTINE SCREEN, URINE: Cotinine Ql Scrn, Ur: NEGATIVE ng/mL

## 2022-07-21 NOTE — Progress Notes (Signed)
April, Dr. [facility] scheduler, called this morning to report lab results, but no answer. Voicemail left with following results: Surgical PCR positive for MSSA and MRSA. Callback number left as well.

## 2022-07-28 NOTE — Anesthesia Preprocedure Evaluation (Addendum)
Anesthesia Evaluation  Patient identified by MRN, date of birth, ID band Patient awake    Reviewed: Allergy & Precautions, NPO status , Patient's Chart, lab work & pertinent test results  Airway Mallampati: I       Dental no notable dental hx.    Pulmonary former smoker   Pulmonary exam normal        Cardiovascular hypertension, Pt. on medications Normal cardiovascular exam     Neuro/Psych   Anxiety      Neuromuscular disease    GI/Hepatic ,GERD  Medicated,,  Endo/Other  diabetes, Type 2, Oral Hypoglycemic Agents    Renal/GU   negative genitourinary   Musculoskeletal  (+) Arthritis , Rheumatoid disorders,    Abdominal  (+) + obese  Peds  Hematology   Anesthesia Other Findings   Reproductive/Obstetrics                             Anesthesia Physical Anesthesia Plan  ASA: 2  Anesthesia Plan: General   Post-op Pain Management:    Induction: Intravenous  PONV Risk Score and Plan: 4 or greater and Ondansetron and Treatment may vary due to age or medical condition  Airway Management Planned: Oral ETT  Additional Equipment: Arterial line  Intra-op Plan:   Post-operative Plan: Possible Post-op intubation/ventilation  Informed Consent: I have reviewed the patients History and Physical, chart, labs and discussed the procedure including the risks, benefits and alternatives for the proposed anesthesia with the patient or authorized representative who has indicated his/her understanding and acceptance.     Dental advisory given  Plan Discussed with: CRNA  Anesthesia Plan Comments: (2 PIV + A-Line)       Anesthesia Quick Evaluation

## 2022-07-29 ENCOUNTER — Inpatient Hospital Stay (HOSPITAL_COMMUNITY): Payer: Medicare HMO | Admitting: Anesthesiology

## 2022-07-29 ENCOUNTER — Inpatient Hospital Stay (HOSPITAL_COMMUNITY): Payer: Medicare HMO

## 2022-07-29 ENCOUNTER — Inpatient Hospital Stay (HOSPITAL_COMMUNITY)
Admission: RE | Disposition: A | Discharge status: Skilled Nursing Facility | Payer: Self-pay | Source: Home / Self Care | Attending: Orthopedic Surgery

## 2022-07-29 ENCOUNTER — Other Ambulatory Visit: Payer: Self-pay

## 2022-07-29 ENCOUNTER — Inpatient Hospital Stay (HOSPITAL_COMMUNITY)
Admission: RE | Admit: 2022-07-29 | Discharge: 2022-08-08 | DRG: 454 | Disposition: A | Discharge status: Skilled Nursing Facility | Payer: Medicare HMO | Attending: Orthopedic Surgery | Admitting: Orthopedic Surgery

## 2022-07-29 ENCOUNTER — Encounter (HOSPITAL_COMMUNITY): Payer: Self-pay | Admitting: Orthopedic Surgery

## 2022-07-29 DIAGNOSIS — M48062 Spinal stenosis, lumbar region with neurogenic claudication: Secondary | ICD-10-CM | POA: Diagnosis not present

## 2022-07-29 DIAGNOSIS — E669 Obesity, unspecified: Secondary | ICD-10-CM | POA: Diagnosis not present

## 2022-07-29 DIAGNOSIS — K219 Gastro-esophageal reflux disease without esophagitis: Secondary | ICD-10-CM | POA: Diagnosis present

## 2022-07-29 DIAGNOSIS — E1149 Type 2 diabetes mellitus with other diabetic neurological complication: Secondary | ICD-10-CM

## 2022-07-29 DIAGNOSIS — J90 Pleural effusion, not elsewhere classified: Secondary | ICD-10-CM | POA: Diagnosis not present

## 2022-07-29 DIAGNOSIS — M5136 Other intervertebral disc degeneration, lumbar region: Secondary | ICD-10-CM | POA: Diagnosis not present

## 2022-07-29 DIAGNOSIS — Z4789 Encounter for other orthopedic aftercare: Secondary | ICD-10-CM | POA: Diagnosis not present

## 2022-07-29 DIAGNOSIS — I959 Hypotension, unspecified: Secondary | ICD-10-CM | POA: Diagnosis not present

## 2022-07-29 DIAGNOSIS — E785 Hyperlipidemia, unspecified: Secondary | ICD-10-CM | POA: Diagnosis present

## 2022-07-29 DIAGNOSIS — M532X6 Spinal instabilities, lumbar region: Secondary | ICD-10-CM | POA: Diagnosis present

## 2022-07-29 DIAGNOSIS — E1141 Type 2 diabetes mellitus with diabetic mononeuropathy: Secondary | ICD-10-CM | POA: Diagnosis present

## 2022-07-29 DIAGNOSIS — R110 Nausea: Secondary | ICD-10-CM | POA: Diagnosis not present

## 2022-07-29 DIAGNOSIS — F32A Depression, unspecified: Secondary | ICD-10-CM | POA: Diagnosis present

## 2022-07-29 DIAGNOSIS — Z981 Arthrodesis status: Secondary | ICD-10-CM

## 2022-07-29 DIAGNOSIS — Z8261 Family history of arthritis: Secondary | ICD-10-CM | POA: Diagnosis not present

## 2022-07-29 DIAGNOSIS — G8929 Other chronic pain: Secondary | ICD-10-CM | POA: Diagnosis present

## 2022-07-29 DIAGNOSIS — E119 Type 2 diabetes mellitus without complications: Secondary | ICD-10-CM | POA: Diagnosis not present

## 2022-07-29 DIAGNOSIS — M47816 Spondylosis without myelopathy or radiculopathy, lumbar region: Secondary | ICD-10-CM | POA: Diagnosis not present

## 2022-07-29 DIAGNOSIS — Z801 Family history of malignant neoplasm of trachea, bronchus and lung: Secondary | ICD-10-CM

## 2022-07-29 DIAGNOSIS — J9811 Atelectasis: Secondary | ICD-10-CM | POA: Diagnosis not present

## 2022-07-29 DIAGNOSIS — Z6832 Body mass index (BMI) 32.0-32.9, adult: Secondary | ICD-10-CM | POA: Diagnosis not present

## 2022-07-29 DIAGNOSIS — M438X9 Other specified deforming dorsopathies, site unspecified: Secondary | ICD-10-CM

## 2022-07-29 DIAGNOSIS — E1140 Type 2 diabetes mellitus with diabetic neuropathy, unspecified: Secondary | ICD-10-CM | POA: Diagnosis not present

## 2022-07-29 DIAGNOSIS — D62 Acute posthemorrhagic anemia: Secondary | ICD-10-CM | POA: Diagnosis not present

## 2022-07-29 DIAGNOSIS — Z978 Presence of other specified devices: Secondary | ICD-10-CM | POA: Diagnosis not present

## 2022-07-29 DIAGNOSIS — Z7984 Long term (current) use of oral hypoglycemic drugs: Secondary | ICD-10-CM

## 2022-07-29 DIAGNOSIS — M4036 Flatback syndrome, lumbar region: Secondary | ICD-10-CM | POA: Diagnosis not present

## 2022-07-29 DIAGNOSIS — Z79899 Other long term (current) drug therapy: Secondary | ICD-10-CM

## 2022-07-29 DIAGNOSIS — M6281 Muscle weakness (generalized): Secondary | ICD-10-CM | POA: Diagnosis not present

## 2022-07-29 DIAGNOSIS — M4035 Flatback syndrome, thoracolumbar region: Secondary | ICD-10-CM | POA: Diagnosis not present

## 2022-07-29 DIAGNOSIS — F419 Anxiety disorder, unspecified: Secondary | ICD-10-CM | POA: Diagnosis present

## 2022-07-29 DIAGNOSIS — Z720 Tobacco use: Secondary | ICD-10-CM

## 2022-07-29 DIAGNOSIS — M069 Rheumatoid arthritis, unspecified: Secondary | ICD-10-CM | POA: Diagnosis not present

## 2022-07-29 DIAGNOSIS — I10 Essential (primary) hypertension: Secondary | ICD-10-CM | POA: Diagnosis not present

## 2022-07-29 DIAGNOSIS — Z8249 Family history of ischemic heart disease and other diseases of the circulatory system: Secondary | ICD-10-CM | POA: Diagnosis not present

## 2022-07-29 DIAGNOSIS — M5459 Other low back pain: Secondary | ICD-10-CM | POA: Diagnosis not present

## 2022-07-29 DIAGNOSIS — Z833 Family history of diabetes mellitus: Secondary | ICD-10-CM

## 2022-07-29 DIAGNOSIS — Z87891 Personal history of nicotine dependence: Secondary | ICD-10-CM

## 2022-07-29 HISTORY — PX: SPINAL FUSION: SHX223

## 2022-07-29 LAB — POCT I-STAT 7, (LYTES, BLD GAS, ICA,H+H)
Acid-base deficit: 3 mmol/L — ABNORMAL HIGH (ref 0.0–2.0)
Acid-base deficit: 6 mmol/L — ABNORMAL HIGH (ref 0.0–2.0)
Acid-base deficit: 7 mmol/L — ABNORMAL HIGH (ref 0.0–2.0)
Bicarbonate: 22.7 mmol/L (ref 20.0–28.0)
Bicarbonate: 19.4 mmol/L — ABNORMAL LOW (ref 20.0–28.0)
Bicarbonate: 19.6 mmol/L — ABNORMAL LOW (ref 20.0–28.0)
Calcium, Ion: 1.09 mmol/L — ABNORMAL LOW (ref 1.15–1.40)
Calcium, Ion: 1.03 mmol/L — ABNORMAL LOW (ref 1.15–1.40)
Calcium, Ion: 1.07 mmol/L — ABNORMAL LOW (ref 1.15–1.40)
HCT: 32 % — ABNORMAL LOW (ref 36.0–46.0)
HCT: 25 % — ABNORMAL LOW (ref 36.0–46.0)
HCT: 23 % — ABNORMAL LOW (ref 36.0–46.0)
Hemoglobin: 10.9 g/dL — ABNORMAL LOW (ref 12.0–15.0)
Hemoglobin: 8.5 g/dL — ABNORMAL LOW (ref 12.0–15.0)
Hemoglobin: 7.8 g/dL — ABNORMAL LOW (ref 12.0–15.0)
O2 Saturation: 100 %
O2 Saturation: 100 %
O2 Saturation: 100 %
Patient temperature: 36.2
Potassium: 3.8 mmol/L (ref 3.5–5.1)
Potassium: 4 mmol/L (ref 3.5–5.1)
Potassium: 4.9 mmol/L (ref 3.5–5.1)
Sodium: 134 mmol/L — ABNORMAL LOW (ref 135–145)
Sodium: 136 mmol/L (ref 135–145)
Sodium: 135 mmol/L (ref 135–145)
TCO2: 24 mmol/L (ref 22–32)
TCO2: 21 mmol/L — ABNORMAL LOW (ref 22–32)
TCO2: 21 mmol/L — ABNORMAL LOW (ref 22–32)
pCO2 arterial: 40.2 mmHg (ref 32–48)
pCO2 arterial: 38 mmHg (ref 32–48)
pCO2 arterial: 43.1 mmHg (ref 32–48)
pH, Arterial: 7.356 (ref 7.35–7.45)
pH, Arterial: 7.316 — ABNORMAL LOW (ref 7.35–7.45)
pH, Arterial: 7.267 — ABNORMAL LOW (ref 7.35–7.45)
pO2, Arterial: 228 mmHg — ABNORMAL HIGH (ref 83–108)
pO2, Arterial: 290 mmHg — ABNORMAL HIGH (ref 83–108)
pO2, Arterial: 364 mmHg — ABNORMAL HIGH (ref 83–108)

## 2022-07-29 LAB — BPAM RBC: Unit Type and Rh: 7300

## 2022-07-29 LAB — GLUCOSE, CAPILLARY
Glucose-Capillary: 256 mg/dL — ABNORMAL HIGH (ref 70–99)
Glucose-Capillary: 222 mg/dL — ABNORMAL HIGH (ref 70–99)
Glucose-Capillary: 243 mg/dL — ABNORMAL HIGH (ref 70–99)
Glucose-Capillary: 292 mg/dL — ABNORMAL HIGH (ref 70–99)
Glucose-Capillary: 260 mg/dL — ABNORMAL HIGH (ref 70–99)
Glucose-Capillary: 255 mg/dL — ABNORMAL HIGH (ref 70–99)
Glucose-Capillary: 233 mg/dL — ABNORMAL HIGH (ref 70–99)

## 2022-07-29 LAB — TYPE AND SCREEN

## 2022-07-29 LAB — ABO/RH: ABO/RH(D): B POS

## 2022-07-29 LAB — PREPARE RBC (CROSSMATCH)

## 2022-07-29 SURGERY — OBLIQUE LUMBAR INTERBODY FUSION 2 LEVEL
Anesthesia: General

## 2022-07-29 MED ORDER — KETAMINE HCL 10 MG/ML IJ SOLN
INTRAMUSCULAR | Status: DC | PRN
  Administered 2022-07-29: 20 mg via INTRAVENOUS

## 2022-07-29 MED ORDER — CEFAZOLIN SODIUM-DEXTROSE 2-4 GM/100ML-% IV SOLN
2.0000 g | INTRAVENOUS | Status: AC
  Administered 2022-07-29 (×3): 2 g via INTRAVENOUS
  Filled 2022-07-29: qty 100

## 2022-07-29 MED ORDER — FENTANYL CITRATE (PF) 100 MCG/2ML IJ SOLN
25.0000 ug | Freq: Once | INTRAMUSCULAR | Status: AC | PRN
  Administered 2022-07-29: 100 ug via INTRAVENOUS

## 2022-07-29 MED ORDER — POLYETHYLENE GLYCOL 3350 17 G PO PACK: 17.0000 g | PACK | Freq: Every day | ORAL | Status: DC

## 2022-07-29 MED ORDER — CHLORHEXIDINE GLUCONATE CLOTH 2 % EX PADS
6.0000 | MEDICATED_PAD | Freq: Every day | CUTANEOUS | Status: DC
  Administered 2022-07-30 – 2022-08-08 (×8): 6 via TOPICAL

## 2022-07-29 MED ORDER — INSULIN ASPART 100 UNIT/ML IJ SOLN
INTRAMUSCULAR | Status: AC
  Administered 2022-07-29: 3 [IU] via SUBCUTANEOUS
  Filled 2022-07-29: qty 1

## 2022-07-29 MED ORDER — DEXMEDETOMIDINE HCL IN NACL 80 MCG/20ML IV SOLN
INTRAVENOUS | Status: AC
  Filled 2022-07-29: qty 20

## 2022-07-29 MED ORDER — LABETALOL HCL 5 MG/ML IV SOLN
INTRAVENOUS | Status: DC | PRN
  Administered 2022-07-29 (×2): 2.5 mg via INTRAVENOUS
  Administered 2022-07-29: 5 mg via INTRAVENOUS

## 2022-07-29 MED ORDER — HYDROMORPHONE HCL 1 MG/ML IJ SOLN
INTRAMUSCULAR | Status: AC
  Filled 2022-07-29: qty 0.5

## 2022-07-29 MED ORDER — FAMOTIDINE 20 MG PO TABS: 20.0000 mg | ORAL_TABLET | Freq: Two times a day (BID) | ORAL | Status: DC

## 2022-07-29 MED ORDER — FENTANYL CITRATE (PF) 250 MCG/5ML IJ SOLN
INTRAMUSCULAR | Status: AC
  Filled 2022-07-29: qty 5

## 2022-07-29 MED ORDER — LABETALOL HCL 5 MG/ML IV SOLN
INTRAVENOUS | Status: AC
  Filled 2022-07-29: qty 4

## 2022-07-29 MED ORDER — SODIUM CHLORIDE 0.9% IV SOLUTION: Freq: Once | INTRAVENOUS | Status: AC

## 2022-07-29 MED ORDER — ACETAMINOPHEN 10 MG/ML IV SOLN
INTRAVENOUS | Status: AC
  Filled 2022-07-29: qty 100

## 2022-07-29 MED ORDER — LACTATED RINGERS IV SOLN: INTRAVENOUS | Status: DC

## 2022-07-29 MED ORDER — ALBUMIN HUMAN 5 % IV SOLN: INTRAVENOUS | Status: DC | PRN

## 2022-07-29 MED ORDER — ONDANSETRON HCL 4 MG/2ML IJ SOLN
INTRAMUSCULAR | Status: AC
  Filled 2022-07-29: qty 2

## 2022-07-29 MED ORDER — INSULIN ASPART 100 UNIT/ML IJ SOLN
0.0000 [IU] | INTRAMUSCULAR | Status: AC | PRN
  Administered 2022-07-29: 5 [IU] via SUBCUTANEOUS
  Administered 2022-07-29: 3 [IU] via SUBCUTANEOUS
  Administered 2022-07-29: 5 [IU] via SUBCUTANEOUS

## 2022-07-29 MED ORDER — ACETAMINOPHEN 10 MG/ML IV SOLN
INTRAVENOUS | Status: DC | PRN
  Administered 2022-07-29: 1000 mg via INTRAVENOUS

## 2022-07-29 MED ORDER — SUCCINYLCHOLINE CHLORIDE 200 MG/10ML IV SOSY
PREFILLED_SYRINGE | INTRAVENOUS | Status: AC
  Filled 2022-07-29: qty 10

## 2022-07-29 MED ORDER — PROPOFOL 10 MG/ML IV BOLUS
INTRAVENOUS | Status: AC
  Filled 2022-07-29: qty 20

## 2022-07-29 MED ORDER — LIDOCAINE 2% (20 MG/ML) 5 ML SYRINGE
INTRAMUSCULAR | Status: AC
  Filled 2022-07-29: qty 5

## 2022-07-29 MED ORDER — INSULIN ASPART 100 UNIT/ML IJ SOLN
0.0000 [IU] | INTRAMUSCULAR | Status: DC
  Administered 2022-07-30: 2 [IU] via SUBCUTANEOUS
  Administered 2022-07-30 (×2): 3 [IU] via SUBCUTANEOUS
  Administered 2022-07-30: 2 [IU] via SUBCUTANEOUS
  Administered 2022-07-30: 5 [IU] via SUBCUTANEOUS
  Administered 2022-07-30: 2 [IU] via SUBCUTANEOUS
  Administered 2022-07-31: 3 [IU] via SUBCUTANEOUS
  Administered 2022-07-31 (×2): 2 [IU] via SUBCUTANEOUS
  Administered 2022-07-31 – 2022-08-01 (×2): 3 [IU] via SUBCUTANEOUS
  Administered 2022-08-01: 5 [IU] via SUBCUTANEOUS
  Administered 2022-08-01 – 2022-08-02 (×3): 3 [IU] via SUBCUTANEOUS
  Administered 2022-08-02: 5 [IU] via SUBCUTANEOUS
  Administered 2022-08-02: 2 [IU] via SUBCUTANEOUS
  Administered 2022-08-02 – 2022-08-03 (×2): 5 [IU] via SUBCUTANEOUS
  Administered 2022-08-03: 3 [IU] via SUBCUTANEOUS
  Administered 2022-08-03: 2 [IU] via SUBCUTANEOUS
  Administered 2022-08-04: 5 [IU] via SUBCUTANEOUS
  Administered 2022-08-04: 2 [IU] via SUBCUTANEOUS

## 2022-07-29 MED ORDER — 0.9 % SODIUM CHLORIDE (POUR BTL) OPTIME
TOPICAL | Status: DC | PRN
  Administered 2022-07-29 (×2): 1000 mL

## 2022-07-29 MED ORDER — ACETAMINOPHEN 10 MG/ML IV SOLN: 1000.0000 mg | Freq: Once | INTRAVENOUS | Status: DC | PRN

## 2022-07-29 MED ORDER — PROPOFOL 1000 MG/100ML IV EMUL
INTRAVENOUS | Status: AC
  Filled 2022-07-29: qty 100

## 2022-07-29 MED ORDER — MIDAZOLAM HCL 2 MG/2ML IJ SOLN
INTRAMUSCULAR | Status: AC
  Filled 2022-07-29: qty 2

## 2022-07-29 MED ORDER — PROPOFOL 500 MG/50ML IV EMUL
INTRAVENOUS | Status: DC | PRN
  Administered 2022-07-29: 25 ug/kg/min via INTRAVENOUS
  Administered 2022-07-29: 50 ug/kg/min via INTRAVENOUS
  Administered 2022-07-29: 100 ug/kg/min via INTRAVENOUS

## 2022-07-29 MED ORDER — KETAMINE HCL 50 MG/5ML IJ SOSY
PREFILLED_SYRINGE | INTRAMUSCULAR | Status: AC
  Filled 2022-07-29: qty 5

## 2022-07-29 MED ORDER — ONDANSETRON HCL 4 MG/2ML IJ SOLN
INTRAMUSCULAR | Status: DC | PRN
  Administered 2022-07-29: 4 mg via INTRAVENOUS

## 2022-07-29 MED ORDER — DEXMEDETOMIDINE HCL IN NACL 400 MCG/100ML IV SOLN
INTRAVENOUS | Status: AC
  Administered 2022-07-30: 0.4 ug/kg/h via INTRAVENOUS
  Filled 2022-07-29: qty 100

## 2022-07-29 MED ORDER — VANCOMYCIN HCL 1000 MG IV SOLR
INTRAVENOUS | Status: DC | PRN
  Administered 2022-07-29 (×2): 1000 mg

## 2022-07-29 MED ORDER — VANCOMYCIN HCL 1000 MG IV SOLR
INTRAVENOUS | Status: AC
  Filled 2022-07-29: qty 20

## 2022-07-29 MED ORDER — THROMBIN 20000 UNITS EX SOLR: CUTANEOUS | Status: DC | PRN

## 2022-07-29 MED ORDER — HYDROMORPHONE HCL 1 MG/ML IJ SOLN
INTRAMUSCULAR | Status: DC | PRN
  Administered 2022-07-29 (×3): .5 mg via INTRAVENOUS

## 2022-07-29 MED ORDER — TRANEXAMIC ACID-NACL 1000-0.7 MG/100ML-% IV SOLN
INTRAVENOUS | Status: AC
  Filled 2022-07-29: qty 100

## 2022-07-29 MED ORDER — DEXAMETHASONE SODIUM PHOSPHATE 10 MG/ML IJ SOLN
INTRAMUSCULAR | Status: AC
  Filled 2022-07-29: qty 1

## 2022-07-29 MED ORDER — HYDROMORPHONE HCL 1 MG/ML IJ SOLN: 0.2500 mg | INTRAMUSCULAR | Status: DC | PRN

## 2022-07-29 MED ORDER — PHENYLEPHRINE HCL-NACL 20-0.9 MG/250ML-% IV SOLN
INTRAVENOUS | Status: DC | PRN
  Administered 2022-07-29: 25 ug/min via INTRAVENOUS

## 2022-07-29 MED ORDER — LIDOCAINE 2% (20 MG/ML) 5 ML SYRINGE
INTRAMUSCULAR | Status: DC | PRN
  Administered 2022-07-29: 100 mg via INTRAVENOUS

## 2022-07-29 MED ORDER — CEFAZOLIN SODIUM-DEXTROSE 2-4 GM/100ML-% IV SOLN
INTRAVENOUS | Status: AC
  Filled 2022-07-29: qty 100

## 2022-07-29 MED ORDER — SUCCINYLCHOLINE CHLORIDE 200 MG/10ML IV SOSY
PREFILLED_SYRINGE | INTRAVENOUS | Status: DC | PRN
  Administered 2022-07-29: 130 mg via INTRAVENOUS

## 2022-07-29 MED ORDER — FENTANYL CITRATE PF 50 MCG/ML IJ SOSY: 25.0000 ug | PREFILLED_SYRINGE | INTRAMUSCULAR | Status: DC | PRN

## 2022-07-29 MED ORDER — ONDANSETRON HCL 4 MG/2ML IJ SOLN: 4.0000 mg | Freq: Once | INTRAMUSCULAR | Status: DC | PRN

## 2022-07-29 MED ORDER — FENTANYL CITRATE (PF) 250 MCG/5ML IJ SOLN
INTRAMUSCULAR | Status: DC | PRN
  Administered 2022-07-29 (×5): 50 ug via INTRAVENOUS
  Administered 2022-07-29: 25 ug via INTRAVENOUS
  Administered 2022-07-29 (×4): 50 ug via INTRAVENOUS
  Administered 2022-07-29: 25 ug via INTRAVENOUS

## 2022-07-29 MED ORDER — THROMBIN (RECOMBINANT) 20000 UNITS EX SOLR
CUTANEOUS | Status: AC
  Filled 2022-07-29: qty 20000

## 2022-07-29 MED ORDER — CHLORHEXIDINE GLUCONATE 0.12 % MT SOLN
15.0000 mL | Freq: Once | OROMUCOSAL | Status: AC
  Administered 2022-07-29: 15 mL via OROMUCOSAL
  Filled 2022-07-29: qty 15

## 2022-07-29 MED ORDER — DEXAMETHASONE SODIUM PHOSPHATE 10 MG/ML IJ SOLN
10.0000 mg | Freq: Once | INTRAMUSCULAR | Status: AC
  Administered 2022-07-29: 5 mg via INTRAVENOUS
  Filled 2022-07-29: qty 1

## 2022-07-29 MED ORDER — FENTANYL CITRATE (PF) 100 MCG/2ML IJ SOLN
INTRAMUSCULAR | Status: AC
  Filled 2022-07-29: qty 2

## 2022-07-29 MED ORDER — LACTATED RINGERS IV SOLN: INTRAVENOUS | Status: DC | PRN

## 2022-07-29 MED ORDER — POVIDONE-IODINE 10 % EX SWAB
2.0000 | Freq: Once | CUTANEOUS | Status: AC
  Administered 2022-07-29: 2 via TOPICAL

## 2022-07-29 MED ORDER — ORAL CARE MOUTH RINSE: 15.0000 mL | Freq: Once | OROMUCOSAL | Status: AC

## 2022-07-29 MED ORDER — DEXMEDETOMIDINE HCL IN NACL 400 MCG/100ML IV SOLN
0.0000 ug/kg/h | INTRAVENOUS | Status: DC
  Administered 2022-07-30 (×2): 0.7 ug/kg/h via INTRAVENOUS
  Administered 2022-07-30: 0.4 ug/kg/h via INTRAVENOUS
  Filled 2022-07-29: qty 100

## 2022-07-29 MED ORDER — PHENYLEPHRINE 80 MCG/ML (10ML) SYRINGE FOR IV PUSH (FOR BLOOD PRESSURE SUPPORT)
PREFILLED_SYRINGE | INTRAVENOUS | Status: DC | PRN
  Administered 2022-07-29: 80 ug via INTRAVENOUS
  Administered 2022-07-29: 40 ug via INTRAVENOUS
  Administered 2022-07-29 (×6): 80 ug via INTRAVENOUS

## 2022-07-29 MED ORDER — DEXMEDETOMIDINE HCL IN NACL 80 MCG/20ML IV SOLN
INTRAVENOUS | Status: DC | PRN
  Administered 2022-07-29 (×2): 4 ug via INTRAVENOUS
  Administered 2022-07-29: 8 ug via INTRAVENOUS
  Administered 2022-07-29: 4 ug via INTRAVENOUS
  Administered 2022-07-29: 8 ug via INTRAVENOUS

## 2022-07-29 MED ORDER — MIDAZOLAM HCL 2 MG/2ML IJ SOLN
INTRAMUSCULAR | Status: DC | PRN
  Administered 2022-07-29 (×2): 1 mg via INTRAVENOUS

## 2022-07-29 MED ORDER — TRANEXAMIC ACID-NACL 1000-0.7 MG/100ML-% IV SOLN
1000.0000 mg | INTRAVENOUS | Status: AC
  Administered 2022-07-29 (×2): 1000 mg via INTRAVENOUS
  Filled 2022-07-29: qty 100

## 2022-07-29 MED ORDER — FENTANYL CITRATE PF 50 MCG/ML IJ SOSY
25.0000 ug | PREFILLED_SYRINGE | INTRAMUSCULAR | Status: DC | PRN
  Administered 2022-07-30 (×2): 100 ug via INTRAVENOUS
  Filled 2022-07-29 (×2): qty 2

## 2022-07-29 MED ORDER — ROCURONIUM BROMIDE 10 MG/ML (PF) SYRINGE
PREFILLED_SYRINGE | INTRAVENOUS | Status: DC | PRN
  Administered 2022-07-29: 30 mg via INTRAVENOUS

## 2022-07-29 MED ORDER — PROPOFOL 10 MG/ML IV BOLUS
INTRAVENOUS | Status: DC | PRN
  Administered 2022-07-29: 50 mg via INTRAVENOUS
  Administered 2022-07-29: 10 mg via INTRAVENOUS
  Administered 2022-07-29: 200 mg via INTRAVENOUS
  Administered 2022-07-29: 30 mg via INTRAVENOUS
  Administered 2022-07-29: 25 mg via INTRAVENOUS
  Administered 2022-07-29: 30 mg via INTRAVENOUS
  Administered 2022-07-29: 50 mg via INTRAVENOUS
  Administered 2022-07-29: 30 mg via INTRAVENOUS
  Administered 2022-07-29: 50 mg via INTRAVENOUS
  Administered 2022-07-29: 30 mg via INTRAVENOUS
  Administered 2022-07-29: 40 mg via INTRAVENOUS
  Administered 2022-07-29: 30 mg via INTRAVENOUS

## 2022-07-29 MED ORDER — DOCUSATE SODIUM 50 MG/5ML PO LIQD: 100.0000 mg | Freq: Two times a day (BID) | ORAL | Status: DC

## 2022-07-29 MED ORDER — ROCURONIUM BROMIDE 10 MG/ML (PF) SYRINGE
PREFILLED_SYRINGE | INTRAVENOUS | Status: AC
  Filled 2022-07-29: qty 10

## 2022-07-29 SURGICAL SUPPLY — 96 items
ADH SKN CLS LQ APL DERMABOND (GAUZE/BANDAGES/DRESSINGS) ×1
APL SKNCLS STERI-STRIP NONHPOA (GAUZE/BANDAGES/DRESSINGS) ×3
BENZOIN TINCTURE PRP APPL 2/3 (GAUZE/BANDAGES/DRESSINGS) ×1 IMPLANT
BONE FIBERS PLIAFX 10 (Bone Implant) ×3 IMPLANT
BUR NEURO DRILL SOFT 3.0X3.8M (BURR) ×1 IMPLANT
CAGE MODULUS XL 10X18X45 - 10 (Cage) IMPLANT
CAGE MODULUS XL 8X18X45 - 10 (Cage) IMPLANT
CANISTER SUCT 3000ML PPV (MISCELLANEOUS) ×1 IMPLANT
CATH FOLEY 2WAY SLVR 5CC 14FR (CATHETERS) IMPLANT
CLSR STERI-STRIP ANTIMIC 1/2X4 (GAUZE/BANDAGES/DRESSINGS) ×1 IMPLANT
CNTNR URN SCR LID CUP LEK RST (MISCELLANEOUS) IMPLANT
CONT SPEC 4OZ STRL OR WHT (MISCELLANEOUS) ×1
CORD BIPOLAR FORCEPS 12FT (ELECTRODE) IMPLANT
COUNTER NDL MAGNETIC 40 RED (SET/KITS/TRAYS/PACK) IMPLANT
COUNTER NEEDLE MAGNETIC 40 RED (SET/KITS/TRAYS/PACK) ×1 IMPLANT
COVER SURGICAL LIGHT HANDLE (MISCELLANEOUS) ×1 IMPLANT
DERMABOND ADVANCED .7 DNX6 (GAUZE/BANDAGES/DRESSINGS) IMPLANT
DIGITIZER BENDINI (MISCELLANEOUS) IMPLANT
DISSECTOR STICK (MISCELLANEOUS) IMPLANT
DRAIN HEMOVAC 7FR (DRAIN) ×2 IMPLANT
DRAPE C-ARM 42X72 X-RAY (DRAPES) ×1 IMPLANT
DRAPE C-ARMOR (DRAPES) IMPLANT
DRAPE INCISE IOBAN 66X45 STRL (DRAPES) IMPLANT
DRAPE UTILITY XL STRL (DRAPES) ×2 IMPLANT
DRESSING MEPILEX FLEX 4X4 (GAUZE/BANDAGES/DRESSINGS) ×1 IMPLANT
DRSG MEPILEX FLEX 4X4 (GAUZE/BANDAGES/DRESSINGS) ×1
DRSG MEPILEX POST OP 4X12 (GAUZE/BANDAGES/DRESSINGS) IMPLANT
DRSG TEGADERM 4X10 (GAUZE/BANDAGES/DRESSINGS) ×1 IMPLANT
DRSG TEGADERM 4X4.75 (GAUZE/BANDAGES/DRESSINGS) IMPLANT
DRSG TELFA 3X8 NADH STRL (GAUZE/BANDAGES/DRESSINGS) IMPLANT
DURAPREP 26ML APPLICATOR (WOUND CARE) ×1 IMPLANT
ELECT COATED BLADE 2.86 ST (ELECTRODE) ×1 IMPLANT
ELECT NVM5 SURFACE MEP/EMG (ELECTRODE) IMPLANT
ELECT PENCIL ROCKER SW 15FT (MISCELLANEOUS) ×1 IMPLANT
ELECT REM PT RETURN 9FT ADLT (ELECTROSURGICAL) ×1
ELECTRODE REM PT RTRN 9FT ADLT (ELECTROSURGICAL) ×1 IMPLANT
EVACUATOR 1/8 PVC DRAIN (DRAIN) IMPLANT
GAUZE 4X4 16PLY ~~LOC~~+RFID DBL (SPONGE) IMPLANT
GAUZE SPONGE 4X4 12PLY STRL (GAUZE/BANDAGES/DRESSINGS) ×1 IMPLANT
GLOVE BIO SURGEON STRL SZ7.5 (GLOVE) ×1 IMPLANT
GLOVE BIOGEL PI IND STRL 7.5 (GLOVE) ×1 IMPLANT
GLOVE INDICATOR 8.0 STRL GRN (GLOVE) ×1 IMPLANT
GOWN STRL REUS W/ TWL XL LVL3 (GOWN DISPOSABLE) ×1 IMPLANT
GOWN STRL REUS W/TWL XL LVL3 (GOWN DISPOSABLE) ×1
GRAFT BNE FBR PLIAFX PRIME 10 (Bone Implant) IMPLANT
GUIDEWIRE NITINOL BEVEL TIP (WIRE) IMPLANT
KIT BASIN OR (CUSTOM PROCEDURE TRAY) ×1 IMPLANT
KIT DILATOR XLIF 5 (KITS) IMPLANT
KIT POSITION SURG JACKSON T1 (MISCELLANEOUS) ×1 IMPLANT
KIT SURGICAL ACCESS MAXCESS 4 (KITS) IMPLANT
KIT TURNOVER KIT B (KITS) ×1 IMPLANT
MANIFOLD NEPTUNE II (INSTRUMENTS) IMPLANT
MIX DBX 10CC 35% BONE (Bone Implant) IMPLANT
MODULE EMG NDL SSEP NVM5 (NEUROSURGERY SUPPLIES) IMPLANT
MODULE EMG NEEDLE SSEP NVM5 (NEUROSURGERY SUPPLIES) ×1 IMPLANT
NDL 22X1.5 STRL (OR ONLY) (MISCELLANEOUS) ×1 IMPLANT
NEEDLE 22X1.5 STRL (OR ONLY) (MISCELLANEOUS) ×1 IMPLANT
NS IRRIG 1000ML POUR BTL (IV SOLUTION) ×1 IMPLANT
PACK LAMINECTOMY ORTHO (CUSTOM PROCEDURE TRAY) ×1 IMPLANT
PATTIES SURGICAL .5 X.5 (GAUZE/BANDAGES/DRESSINGS) IMPLANT
PLATE XLIF MODULUS 10 (Plate) ×2 IMPLANT
RASP HELIOCORDIAL MED (MISCELLANEOUS) IMPLANT
ROD RELINE-O 5.5X300 STRT NS (Rod) IMPLANT
ROD RELINE-O 5.5X300MM STRT (Rod) ×2 IMPLANT
SCREW CERV RELINE PA 8.5X70 (Screw) IMPLANT
SCREW LOCK RELINE 5.5 TULIP (Screw) IMPLANT
SCREW PA RELINE-O 8.5X35 (Screw) IMPLANT
SCREW RELINE-O POLY 5.5X40 (Screw) IMPLANT
SCREW RELINE-O POLY 5.5X45MM (Screw) IMPLANT
SCREW RELINE-O POLY 6.5X40 (Screw) IMPLANT
SCREW RELINE-O POLY 6.5X45 (Screw) IMPLANT
SCREW RELINE-O POLY 7.5X40 (Screw) IMPLANT
SCREW VA COROENT XL 5.5X40 (Screw) IMPLANT
SCREW VA COROENT XL-F 5.5X25 (Screw) IMPLANT
SEALER BIPOLAR AQUA 6.0 (INSTRUMENTS) IMPLANT
SPONGE SURGIFOAM ABS GEL 100 (HEMOSTASIS) ×1 IMPLANT
SPONGE T-LAP 18X18 ~~LOC~~+RFID (SPONGE) IMPLANT
SPONGE T-LAP 4X18 ~~LOC~~+RFID (SPONGE) ×1 IMPLANT
STRIP CLOSURE SKIN 1/2X4 (GAUZE/BANDAGES/DRESSINGS) IMPLANT
SUCTION TUBE FRAZIER 10FR DISP (MISCELLANEOUS) ×1 IMPLANT
SUT BONE WAX W31G (SUTURE) ×1 IMPLANT
SUT ETHILON 3 0 PS 1 (SUTURE) IMPLANT
SUT MNCRL AB 3-0 PS2 18 (SUTURE) IMPLANT
SUT MNCRL AB 4-0 PS2 18 (SUTURE) IMPLANT
SUT VIC AB 0 CT1 18XCR BRD8 (SUTURE) ×1 IMPLANT
SUT VIC AB 0 CT1 27 (SUTURE) ×5
SUT VIC AB 0 CT1 27XCR 8 STRN (SUTURE) IMPLANT
SUT VIC AB 0 CT1 8-18 (SUTURE) ×5
SUT VIC AB 2-0 CT1 18 (SUTURE) ×1 IMPLANT
SYR BULB IRRIG 60ML STRL (SYRINGE) ×1 IMPLANT
SYR CONTROL 10ML LL (SYRINGE) ×1 IMPLANT
TOWEL GREEN STERILE (TOWEL DISPOSABLE) ×1 IMPLANT
TOWEL GREEN STERILE FF (TOWEL DISPOSABLE) ×1 IMPLANT
TUBE CONNECTING 12X1/4 (SUCTIONS) IMPLANT
TUBING FEATHERFLOW (TUBING) ×1 IMPLANT
WATER STERILE IRR 1000ML POUR (IV SOLUTION) ×1 IMPLANT

## 2022-07-29 NOTE — Brief Op Note (Signed)
07/29/2022  10:31 PM  PATIENT:  Wendy [name]  77 y.o. female  PRE-OPERATIVE DIAGNOSIS:  LUMBAR STENOSIS WITH NEUROGENIC CLAUDICATION, SAGITTAL IMBALANCE  POST-OPERATIVE DIAGNOSIS:  LUMBAR STENOSIS WITH NEUROGENIC CLAUDICATION, SAGITTAL IMBALANCE  PROCEDURE:  Procedure(s): L2-3, L3-4 EXTREME LUMBAR INTERBODY FUSION / REMOVAL PRIOR INSTRUMENTATION/ L1-4 LAMINECTOMIES AND FORAMINOTOMIES (N/A) T10-PELVIS INSTRUMENTATION/ T10-L4 POSTERIOR SPINAL FUSION (N/A)  SURGEON:  Surgeon(s) and Role:    * London Sheer, MD - Primary  PHYSICIAN ASSISTANT: NONE  ASSISTANTS: NONE  ANESTHESIA:   general  EBL:  650 mL   BLOOD ADMINISTERED:none  DRAINS:  2 medium hemovac drains    LOCAL MEDICATIONS USED:  NONE  SPECIMEN:  No Specimen  DISPOSITION OF SPECIMEN:  N/A  COUNTS:  YES  TOURNIQUET: NONE  DICTATION: .Note written in EPIC  PLAN OF CARE: Admit to inpatient   PATIENT DISPOSITION:  PACU - hemodynamically stable.   Delay start of Pharmacological VTE agent (>24hrs) due to surgical blood loss or risk of bleeding: yes

## 2022-07-29 NOTE — Progress Notes (Signed)
Pt transported on vent to 71M 13.  No events to report.

## 2022-07-29 NOTE — Anesthesia Procedure Notes (Addendum)
Procedure Name: Intubation Date/Time: 07/29/2022 7:54 AM  Performed by: Shary Decamp, CRNAPre-anesthesia Checklist: Patient identified, Patient being monitored, Timeout performed, Emergency Drugs available and Suction available Patient Re-evaluated:Patient Re-evaluated prior to induction Oxygen Delivery Method: Circle System Utilized Preoxygenation: Pre-oxygenation with 100% oxygen Induction Type: IV induction Ventilation: Mask ventilation without difficulty Laryngoscope Size: Miller and 2 Grade View: Grade I Tube type: Oral Tube size: 7.0 mm Number of attempts: 1 Airway Equipment and Method: Stylet Placement Confirmation: ETT inserted through vocal cords under direct vision, positive ETCO2 and breath sounds checked- equal and bilateral Secured at: 21 cm Tube secured with: Tape Dental Injury: Teeth and Oropharynx as per pre-operative assessment

## 2022-07-29 NOTE — Consult Note (Signed)
NAME:  Wendy [name], MRN:  [id number], DOB:  10-31-1945, LOS: 0 ADMISSION DATE:  07/29/2022, CONSULTATION DATE:  5/28 REFERRING MD:  Dr. 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 She has been following with orthopedics for the back pain and has unfortunately failed conservative management including chiropractor, activity modification, narcotics, and physical therapy. She presented 5/28 for elective L 2-3, L3-4, extreme lumbar interbody fusion, L1-4 laminectomies and foraminotomies. T10-L4 posterior spinal fusion. It was a protracted, but uncomplicated procedure. Post-operatively she was slow to arouse from sedation and could not be safely extubated in the PACU. PCCM was asked to consult for ventilator management.   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   Significant Hospital Events: Including procedures, antibiotic start and stop dates in addition to other pertinent events   5/28 long back surgery. Left on vent post op  Interim History / Subjective:    Objective   Blood pressure 138/79, pulse 87, temperature (!) 96.2 F (35.7 C), temperature source Axillary, resp. rate 14, height 5\' 2"  (1.575 m), weight 81.6 kg, SpO2 100 %.    Vent Mode: PRVC FiO2 (%):  [40 %-70 %] 40 % Set Rate:  [14 bmp] 14 bmp Vt Set:  [500 mL] 500 mL PEEP:  [5 cmH20] 5 cmH20 Plateau Pressure:  [21 cmH20] 21 cmH20   Intake/Output  Summary (Last 24 hours) at 07/29/2022 2336 Last data filed at 07/29/2022 2000 Gross per 24 hour  Intake 5250 ml  Output 1475 ml  Net 3775 ml   Filed Weights   07/29/22 0543  Weight: 81.6 kg    Examination: General: overweight female in NAD on vent HENT: [place]/AT, PERRL, no JVD Lungs: Clear bilateral breath sounds Cardiovascular: RRR, no MRG Abdomen: Soft, NT, ND Extremities: No acute deformity Neuro: Sedated  Resolved Hospital Problem list     Assessment & Plan:   Post op ventilator management:  - Transfer to ICU - Full vent support - Increase RR > repeat ABG in one hour - Precedex, PRN fentanyl for RASS goal -1 to -2 - Daily SBT, Hopefully we can extubate in the morning  DM - CBG monitoring and SSI - holding home glipizide, metformin  Acute blood loss anemia - Transfusing on unit PRBC post op - Repeat ABG in the morning  RA - continue plaquenil when able to take PO  HTN HLD - holding home amlodipine, atorvastatin, lasix     Best Practice (right click and "Reselect all SmartList Selections" daily)   Diet/type: NPO DVT prophylaxis: other - hold with anemia post OP. SCD GI prophylaxis: H2B Lines: N/A Foley:  N/A Code Status:  full code Last date of multidisciplinary goals of care discussion [5/29]  Labs   CBC: Recent Labs  Lab 07/29/22 1534 07/29/22 1924 07/29/22 2209  HGB 10.9* 8.5* 7.8*  HCT 32.0* 25.0* 23.0*    Basic Metabolic Panel: Recent Labs  Lab 07/29/22 1534 07/29/22 1924 07/29/22 2209  NA 134* 136 135  K 3.8 4.0 4.9   GFR: Estimated Creatinine Clearance:  55.3 mL/min (by C-G formula based on SCr of 0.87 mg/dL). No results for input(s): "PROCALCITON", "WBC", "LATICACIDVEN" in the last 168 hours.  Liver Function Tests: No results for input(s): "AST", "ALT", "ALKPHOS", "BILITOT", "PROT", "ALBUMIN" in the last 168 hours. No results for input(s): "LIPASE", "AMYLASE" in the last 168 hours. No results for input(s): "AMMONIA" in the  last 168 hours.  ABG    Component Value Date/Time   PHART 7.267 (L) 07/29/2022 2209   PCO2ART 43.1 07/29/2022 2209   PO2ART 364 (H) 07/29/2022 2209   HCO3 19.6 (L) 07/29/2022 2209   TCO2 21 (L) 07/29/2022 2209   ACIDBASEDEF 7.0 (H) 07/29/2022 2209   O2SAT 100 07/29/2022 2209     Coagulation Profile: No results for input(s): "INR", "PROTIME" in the last 168 hours.  Cardiac Enzymes: No results for input(s): "CKTOTAL", "CKMB", "CKMBINDEX", "TROPONINI" in the last 168 hours.  HbA1C: Hemoglobin A1C  Date/Time Value Ref Range Status  05/16/2022 01:44 PM 6.6 (A) 4.0 - 5.6 % Final   Hgb A1c MFr Bld  Date/Time Value Ref Range Status  07/18/2022 03:02 PM 6.4 (H) 4.8 - 5.6 % Final    Comment:    (NOTE) Pre diabetes:          5.7%-6.4%  Diabetes:              >6.4%  Glycemic control for   <7.0% adults with diabetes   10/08/2021 01:28 PM 7.5 (H) 4.6 - 6.5 % Final    Comment:    Glycemic Control Guidelines for People with Diabetes:Non Diabetic:  <6%Goal of Therapy: <7%Additional Action Suggested:  >8%     CBG: Recent Labs  Lab 07/29/22 1536 07/29/22 1703 07/29/22 1810 07/29/22 2205 07/29/22 2230  GLUCAP 243* 292* 260* 255* 233*    Review of Systems:   Patient is encephalopathic and/or intubated; therefore, history has been obtained from chart review.    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   Surgical History:   Past Surgical History:  Procedure Laterality Date   CATARACT EXTRACTION     COLONOSCOPY  11/23/2019   per Dr. Christella Hartigan, adenmatous polyp, repeat in 7 yrs    ESOPHAGOGASTRODUODENOSCOPY  06/20/2008   at [facility], clear    LUMBAR LAMINECTOMY     1995   ROTATOR CUFF REPAIR Right    UPPER GASTROINTESTINAL ENDOSCOPY  2010   [facility]      Social History:   reports that she quit smoking about 3 months ago. Her smoking use included cigarettes and cigars. She has a 1.35 pack-year smoking history. She has never used smokeless tobacco. She reports current alcohol use. She reports that she does not currently use drugs.   Family History:  Her family history includes Arthritis in an other family member; Cancer in an other family member; Diabetes in an other family member; Hypertension in an other family member; Lung cancer in her mother. There is no history of Colon cancer, Colon polyps, Esophageal cancer, Rectal cancer, or Stomach cancer.   Allergies Allergies  Allergen Reactions   Aspirin Nausea And Vomiting   Tramadol Nausea And Vomiting     Home Medications  Prior to Admission medications   Medication Sig Start Date End Date Taking? Authorizing Provider  ALPRAZolam (XANAX) 1 MG tablet TAKE 1 TABLET (1 MG TOTAL) BY MOUTH AT BEDTIME AS NEEDED FOR ANXIETY OR SLEEP. 04/28/22  Yes [name], MD  amLODipine (NORVASC) 10 MG tablet TAKE 1 TABLET BY MOUTH EVERY DAY 01/31/22  Yes [name], MD  atorvastatin (LIPITOR) 10 MG tablet Take 1 tablet (10 mg total) by mouth daily. 06/25/22  Yes [name], MD  b complex vitamins tablet Take 1 tablet by mouth daily.   Yes [provider]  Cholecalciferol (VITAMIN D3) 20 MCG (800 UNIT) TABS Take 800 Units by mouth daily.   Yes [provider]  DULoxetine (CYMBALTA) 30 MG capsule Take 1 capsule (30 mg total) by mouth daily. Patient taking differently: Take 30 mg by mouth at bedtime. 06/06/22  Yes [name], MD  fluticasone (FLONASE) 50 MCG/ACT nasal spray SPRAY 2 SPRAYS INTO EACH NOSTRIL EVERY DAY Patient taking differently: Place 1 spray into both nostrils daily as needed for  allergies or rhinitis. 12/27/21  Yes [name], MD  furosemide (LASIX) 20 MG tablet Take 1 tablet (20 mg total) by mouth daily as needed (fluid retention). 09/23/21  Yes [name], MD  glipiZIDE (GLUCOTROL) 10 MG tablet Take 0.5 tablets (5 mg total) by mouth 2 (two) times daily before a meal. 05/16/22  Yes [name], MD  HYDROcodone-acetaminophen (NORCO) 10-325 MG tablet Take 1 tablet by mouth every 6 (six) hours as needed for moderate pain. 07/16/22 08/15/22 Yes [name], MD  hydroxychloroquine (PLAQUENIL) 200 MG tablet Take 1 tablet (200 mg total) by mouth daily. 03/17/22  Yes [name], MD  JANUVIA 100 MG tablet TAKE 1 TABLET BY MOUTH EVERY DAY Patient taking differently: Take 100 mg by mouth at bedtime. 05/13/22  Yes [name], MD  levocetirizine (XYZAL) 5 MG tablet Take 1 tablet (5 mg total) by mouth every evening. 06/25/22  Yes [name], MD  metFORMIN (GLUCOPHAGE) 500 MG tablet Take 1 tablet (500 mg total) by mouth 2 (two) times daily with a meal. 09/23/21  Yes [name], MD  methocarbamol (ROBAXIN) 500 MG tablet TAKE 1 TABLET BY MOUTH EVERY 6 HOURS AS NEEDED FOR MUSCLE SPASMS. 01/01/22  Yes [name], MD  naproxen sodium (ALEVE) 220 MG tablet Take 220 mg by mouth 2 (two) times daily as needed (pain).   Yes [provider]  omeprazole (PRILOSEC) 40 MG capsule TAKE 1 CAPSULE BY MOUTH EVERY DAY 06/16/22  Yes [name], MD  ondansetron (ZOFRAN-ODT) 4 MG disintegrating tablet TAKE 1 TABLET BY MOUTH EVERY 8 HOURS AS NEEDED FOR NAUSEA AND VOMITING 06/06/22  Yes [name], MD  polyethylene glycol (MIRALAX / GLYCOLAX) 17 g packet Take 17 g by mouth daily as needed for mild constipation.   Yes [provider]  potassium chloride (KLOR-CON) 10 MEQ tablet Take 1 tablet (10 mEq total) by mouth daily. 06/25/22  Yes [name], MD  pregabalin (LYRICA) 300 MG capsule Take 1 capsule (300 mg total) by mouth at bedtime. 06/06/22  Yes [name],  MD  RESTASIS 0.05 % ophthalmic emulsion Place 1 drop into both eyes 2 (two) times daily. 02/01/22  Yes [provider]  Accu-Chek Softclix Lancets lancets Test once per day and diagnosis code is E 11.9 and dispense for (ACCU-CHEK Soft Touch) 06/25/22   [name], MD  Blood Glucose Monitoring Suppl (ACCU-CHEK GUIDE) w/Device KIT Use to test sugars daily. 02/07/21  [name], MD  Blood Pressure Monitor KIT Use to check blood pressure. 02/17/20   [name], MD  glucose blood (ACCU-CHEK GUIDE) test strip USE TO TEST ONCE DAILY 03/26/22   [name], MD  Lancets (ACCU-CHEK SOFT [facility]) lancets Test once per day and diagnosis code is E 11.9 and dispense for Aviva plus 02/17/20   [name], MD     Critical care time: 32 minutes     Joneen Roach, AGACNP-BC [facility]  See Amion for personal pager PCCM on call pager [phone] until 7pm. Please call Elink 7p-7a. [phone]  07/30/2022 12:35 AM

## 2022-07-29 NOTE — Anesthesia Procedure Notes (Signed)
Arterial Line Insertion Start/End5/28/2024 8:00 AM, 07/29/2022 8:03 AM Performed by: Shelton Silvas, MD, anesthesiologist  Patient location: Pre-op. Preanesthetic checklist: patient identified, IV checked, site marked, risks and benefits discussed, surgical consent, monitors and equipment checked, pre-op evaluation, timeout performed and anesthesia consent Lidocaine 1% used for infiltration Right, radial was placed Catheter size: 20 G Hand hygiene performed  and maximum sterile barriers used   Attempts: 1 Procedure performed without using ultrasound guided technique. Following insertion, dressing applied and Biopatch. Post procedure assessment: normal and unchanged  Post procedure complications: second provider assisted. Patient tolerated the procedure well with no immediate complications.

## 2022-07-29 NOTE — Transfer of Care (Signed)
Immediate Anesthesia Transfer of Care Note  Patient: TIMIYA GERGEL  Procedure(s) Performed: L2-3, L3-4 EXTREME LUMBAR INTERBODY FUSION / REMOVAL PRIOR INSTRUMENTATION/ L1-4 LAMINECTOMIES AND FORAMINOTOMIES T10-PELVIS INSTRUMENTATION/ T10-L4 POSTERIOR SPINAL FUSION  Patient Location: PACU  Anesthesia Type:General  Level of Consciousness: Patient remains intubated per anesthesia plan  Airway & Oxygen Therapy: Patient Spontanous Breathing, Patient remains intubated per anesthesia plan, and Patient placed on Ventilator (see vital sign flow sheet for setting)  Post-op Assessment: Report given to RN and Post -op Vital signs reviewed and stable  Post vital signs: Reviewed and stable  Last Vitals:  Vitals Value Taken Time  BP 119/80 07/29/22 2230  Temp    Pulse 81 07/29/22 2229  Resp 20 07/29/22 2232  SpO2 92 % 07/29/22 2229  Vitals shown include unvalidated device data.  Last Pain:  Vitals:   07/29/22 0624  TempSrc:   PainSc: 8          Complications: No notable events documented.

## 2022-07-29 NOTE — Consult Note (Incomplete)
NAME:  Wendy [name], MRN:  [id number], DOB:  April 06, 1945, LOS: 0 ADMISSION DATE:  07/29/2022, CONSULTATION DATE:  5/28 REFERRING MD:  Dr. 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 She has been following with orthopedics for the back pain and has unfortunately failed conservative management including chiropractor, activity modification, narcotics, and physical therapy. She presented 5/28 for elective L 2-3, L3-4, extreme lumbar interbody fusion, L1-4 laminectomies and foraminotomies. T10-L4 posterior spinal fusion. It was a protracted, but uncomplicated procedure. Post-operatively she was slow to arouse from sedation and could not be safely extubated in the PACU. PCCM was asked to consult for ventilator management.   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   Significant Hospital Events: Including procedures, antibiotic start and stop dates in addition to other pertinent events   5/28 long back surgery. Left on vent post op  Interim History / Subjective:    Objective   Blood pressure 138/79, pulse 87, temperature (!) 96.2 F (35.7 C), temperature source Axillary, resp. rate 14, height 5\' 2"  (1.575 m), weight 81.6 kg, SpO2 100 %.    Vent Mode: PRVC FiO2 (%):  [40 %-70 %] 40 % Set Rate:  [14 bmp] 14 bmp Vt Set:  [500 mL] 500 mL PEEP:  [5 cmH20] 5 cmH20 Plateau Pressure:  [21 cmH20] 21 cmH20   Intake/Output  Summary (Last 24 hours) at 07/29/2022 2336 Last data filed at 07/29/2022 2000 Gross per 24 hour  Intake 5250 ml  Output 1475 ml  Net 3775 ml   Filed Weights   07/29/22 0543  Weight: 81.6 kg    Examination: General: overweight female in NAD on vent HENT: [place]/AT, PERRL, no JVD Lungs: Clear bilateral breath sounds Cardiovascular: RRR, no MRG Abdomen: Soft, NT, ND Extremities: No acute deformity Neuro: Sedated  Resolved Hospital Problem list   ***  Assessment & Plan:  ***  Best Practice (right click and "Reselect all SmartList Selections" daily)   Diet/type: {diet type:25684} DVT prophylaxis: {anticoagulation (Optional):25687} GI prophylaxis: {WU:98119} Lines: {Central Venous Access:25771} Foley:  {Central Venous Access:25691} Code Status:  {Code Status:26939} Last date of multidisciplinary goals of care discussion [***]  Labs   CBC: Recent Labs  Lab 07/29/22 1534 07/29/22 1924 07/29/22 2209  HGB 10.9* 8.5* 7.8*  HCT 32.0* 25.0* 23.0*    Basic Metabolic Panel: Recent Labs  Lab 07/29/22 1534 07/29/22 1924 07/29/22 2209  NA 134* 136 135  K 3.8 4.0 4.9   GFR: Estimated Creatinine Clearance: 55.3 mL/min (by C-G formula based on SCr of 0.87 mg/dL). No results for input(s): "PROCALCITON", "WBC", "LATICACIDVEN" in the last 168 hours.  Liver Function Tests: No results for input(s): "AST", "ALT", "ALKPHOS", "BILITOT", "PROT", "ALBUMIN" in the last 168 hours. No results for input(s): "LIPASE", "AMYLASE" in the last 168 hours. No results for input(s): "AMMONIA" in the last 168 hours.  ABG    Component Value Date/Time   PHART 7.267 (L) 07/29/2022 2209   PCO2ART 43.1 07/29/2022 2209   PO2ART 364 (H) 07/29/2022 2209   HCO3 19.6 (  L) 07/29/2022 2209   TCO2 21 (L) 07/29/2022 2209   ACIDBASEDEF 7.0 (H) 07/29/2022 2209   O2SAT 100 07/29/2022 2209     Coagulation Profile: No results for input(s): "INR", "PROTIME" in the last 168 hours.  Cardiac Enzymes: No  results for input(s): "CKTOTAL", "CKMB", "CKMBINDEX", "TROPONINI" in the last 168 hours.  HbA1C: Hemoglobin A1C  Date/Time Value Ref Range Status  05/16/2022 01:44 PM 6.6 (A) 4.0 - 5.6 % Final   Hgb A1c MFr Bld  Date/Time Value Ref Range Status  07/18/2022 03:02 PM 6.4 (H) 4.8 - 5.6 % Final    Comment:    (NOTE) Pre diabetes:          5.7%-6.4%  Diabetes:              >6.4%  Glycemic control for   <7.0% adults with diabetes   10/08/2021 01:28 PM 7.5 (H) 4.6 - 6.5 % Final    Comment:    Glycemic Control Guidelines for People with Diabetes:Non Diabetic:  <6%Goal of Therapy: <7%Additional Action Suggested:  >8%     CBG: Recent Labs  Lab 07/29/22 1536 07/29/22 1703 07/29/22 1810 07/29/22 2205 07/29/22 2230  GLUCAP 243* 292* 260* 255* 233*    Review of Systems:   ***  Past Medical History:  She,  has a past medical history of Allergy, Anemia, Anxiety, Arthritis, Cataract, DEPENDENT EDEMA (07/21/2007), DIABETES MELLITUS, TYPE II (11/05/2006), FOOT PAIN (05/23/2009), GERD (11/20/2006), HEPATITIS C (02/08/2007), Hyperlipidemia, HYPERTENSION (11/05/2006), LOW BACK PAIN SYNDROME (07/21/2007), Neuromuscular disorder (HCC), 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   Surgical History:   Past Surgical History:  Procedure Laterality Date  . CATARACT EXTRACTION    . COLONOSCOPY  11/23/2019   per Dr. Christella Hartigan, adenmatous polyp, repeat in 7 yrs  . ESOPHAGOGASTRODUODENOSCOPY  06/20/2008   at [facility], clear   . LUMBAR LAMINECTOMY     1995  . ROTATOR CUFF REPAIR Right   . UPPER GASTROINTESTINAL ENDOSCOPY  2010   [facility]      Social History:   reports that she quit smoking about 3 months ago. Her smoking use included cigarettes and cigars. She has a 1.35 pack-year smoking history. She has never used smokeless tobacco. She reports current alcohol use. She reports that she does not currently use drugs.   Family History:  Her family history  includes Arthritis in an other family member; Cancer in an other family member; Diabetes in an other family member; Hypertension in an other family member; Lung cancer in her mother. There is no history of Colon cancer, Colon polyps, Esophageal cancer, Rectal cancer, or Stomach cancer.   Allergies Allergies  Allergen Reactions  . Aspirin Nausea And Vomiting  . Tramadol Nausea And Vomiting     Home Medications  Prior to Admission medications   Medication Sig Start Date End Date Taking? Authorizing Provider  ALPRAZolam (XANAX) 1 MG tablet TAKE 1 TABLET (1 MG TOTAL) BY MOUTH AT BEDTIME AS NEEDED FOR ANXIETY OR SLEEP. 04/28/22  Yes [name], MD  amLODipine (NORVASC) 10 MG tablet TAKE 1 TABLET BY MOUTH EVERY DAY 01/31/22  Yes [name], MD  atorvastatin (LIPITOR) 10 MG tablet Take 1 tablet (10 mg total) by mouth daily. 06/25/22  Yes [name], MD  b complex vitamins tablet Take 1 tablet by mouth daily.   Yes [provider]  Cholecalciferol (VITAMIN D3) 20 MCG (800 UNIT) TABS Take 800 Units by mouth daily.   Yes [provider]  DULoxetine (CYMBALTA) 30 MG capsule Take 1 capsule (30 mg total) by mouth daily. Patient taking differently: Take 30 mg by mouth at bedtime. 06/06/22  Yes [name], MD  fluticasone (FLONASE) 50 MCG/ACT nasal spray SPRAY 2 SPRAYS INTO EACH NOSTRIL EVERY DAY Patient taking differently: Place 1 spray into both nostrils daily as needed for allergies or rhinitis. 12/27/21  Yes [name], MD  furosemide (LASIX) 20 MG tablet Take 1 tablet (20 mg total) by mouth daily as needed (fluid retention). 09/23/21  Yes [name], MD  glipiZIDE (GLUCOTROL) 10 MG tablet Take 0.5 tablets (5 mg total) by mouth 2 (two) times daily before a meal. 05/16/22  Yes [name], MD  HYDROcodone-acetaminophen (NORCO) 10-325 MG tablet Take 1 tablet by mouth every 6 (six) hours as needed for moderate pain. 07/16/22 08/15/22 Yes [name], MD   hydroxychloroquine (PLAQUENIL) 200 MG tablet Take 1 tablet (200 mg total) by mouth daily. 03/17/22  Yes [name], MD  JANUVIA 100 MG tablet TAKE 1 TABLET BY MOUTH EVERY DAY Patient taking differently: Take 100 mg by mouth at bedtime. 05/13/22  Yes [name], MD  levocetirizine (XYZAL) 5 MG tablet Take 1 tablet (5 mg total) by mouth every evening. 06/25/22  Yes [name], MD  metFORMIN (GLUCOPHAGE) 500 MG tablet Take 1 tablet (500 mg total) by mouth 2 (two) times daily with a meal. 09/23/21  Yes [name], MD  methocarbamol (ROBAXIN) 500 MG tablet TAKE 1 TABLET BY MOUTH EVERY 6 HOURS AS NEEDED FOR MUSCLE SPASMS. 01/01/22  Yes [name], MD  naproxen sodium (ALEVE) 220 MG tablet Take 220 mg by mouth 2 (two) times daily as needed (pain).   Yes [provider]  omeprazole (PRILOSEC) 40 MG capsule TAKE 1 CAPSULE BY MOUTH EVERY DAY 06/16/22  Yes [name], MD  ondansetron (ZOFRAN-ODT) 4 MG disintegrating tablet TAKE 1 TABLET BY MOUTH EVERY 8 HOURS AS NEEDED FOR NAUSEA AND VOMITING 06/06/22  Yes [name], MD  polyethylene glycol (MIRALAX / GLYCOLAX) 17 g packet Take 17 g by mouth daily as needed for mild constipation.   Yes [provider]  potassium chloride (KLOR-CON) 10 MEQ tablet Take 1 tablet (10 mEq total) by mouth daily. 06/25/22  Yes [name], MD  pregabalin (LYRICA) 300 MG capsule Take 1 capsule (300 mg total) by mouth at bedtime. 06/06/22  Yes [name], MD  RESTASIS 0.05 % ophthalmic emulsion Place 1 drop into both eyes 2 (two) times daily. 02/01/22  Yes [provider]  Accu-Chek Softclix Lancets lancets Test once per day and diagnosis code is E 11.9 and dispense for (ACCU-CHEK Soft Touch) 06/25/22   [name], MD  Blood Glucose Monitoring Suppl (ACCU-CHEK GUIDE) w/Device KIT Use to test sugars daily. 02/07/21   [name], MD  Blood Pressure Monitor KIT Use to check blood pressure. 02/17/20   [name], MD  glucose  blood (ACCU-CHEK GUIDE) test strip USE TO TEST ONCE DAILY 03/26/22   [name], MD  Lancets (ACCU-CHEK SOFT [facility]) lancets Test once per day and diagnosis code is E 11.9 and dispense for Aviva plus 02/17/20   [name], MD     Critical care time: ***

## 2022-07-29 NOTE — H&P (Signed)
Orthopedic Spine Surgery H&P Note  Assessment: Patient is a 77 y.o. female with low back pain that radiates into her bilateral lower extremities. 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    Plan: -Out of bed as tolerated, activity as tolerated, no brace -Covered the risks of surgery one more time with the patient and patient elected to proceed with planned surgery -Written consent verified -Hold anticoagulation in anticipation of surgery -Ancef on all to OR -NPO for procedure -Site marked -To OR when ready   The patient has symptoms consistent with lumbar stenosis and neurogenic claudication. She also had sagittal imbalance. The patient's symptoms were not getting improvement with conservative treatment so operative management was discussed in the form of L1-4 decompression, L2/3 and L3/4 XLIF, extension of fusion to T10 and pelvis. The risks including but not limited to dural tear, nerve root injury, paralysis, persistent pain, pseudarthrosis, infection, bleeding, hardware failure, adjacent segment disease, vascular injury, lumbar plexus injury, proximal junctional kyphosis, psoas weakness, heart attack, death, stroke, fracture, blindness, and need for additional procedures were discussed with the patient. I discussed that since her case would be revision her risk for infection and durotomy would be higher than a non-revision surgery. The benefit of the surgery would be improvement in the patient's leg and back pain. I explained that her leg will be more predictably relieved with this surgery than her back pain. The alternatives to surgical management were covered with the patient and included continued monitoring, physical therapy, over-the-counter pain medications, ambulatory aids, lyrica/gabapentin, pain management, and activity modification. All the patient's questions were answered to her satisfaction. After this discussion, the patient expressed understanding and elected to proceed  with surgical intervention.    ___________________________________________________________________________  Chief Complaint: low back pain that radiates into bilateral lower extremities  History: Patient is 77 y.o. female who has been previously seen in the office for low back pain that radiates into her bilateral lower extremities. Pain improved with lumbar flexion. Work up showed multiple levels with lumbar stenosis causing her neurogenic claudication. She also had constant right sided groin pain that was not related to activity and exam was not consistent with hip as etiology. She was having significant back pain and has sagittal imbalance. She comes in today still with significant low back pain radiating into her bilateral lower extremities. She does not feel there have been any changes in her symptoms since the last time she was seen in the office.    She has quit all nicotine containing products. Pre-operative screening was negative for nicotine. DEXA scan with T score of -0.3  Review of systems: General: denies fevers and chills, myalgias Neurologic: denies recent changes in vision, slurred speech Abdomen: denies nausea, vomiting, hematemesis Respiratory: denies cough, shortness of breath  Past medical history: Hyperlipidemia Hypertension GERD Depression Anxiety Chronic pain Diabetes (last A1C was 6.4 on 07/18/2022)   Allergies: NKDA   Past surgical history:  L4-S1 decompression and fusion with TLIF's and instrumentation   Social history: Denies use of nicotine product (smoking, vaping, patches, smokeless) Alcohol use: denies Denies recreational drug use  Family history: -reviewed and not pertinent to lumbar stenosis with neurogenic claudication and sagittal imbalance   Physical Exam:  General: no acute distress, appears stated age Neurologic: alert, answering questions appropriately, following commands Cardiovascular: regular rate, no cyanosis Respiratory: unlabored  breathing on room air, symmetric chest rise Psychiatric: appropriate affect, normal cadence to speech   MSK (spine):  -Strength exam      Left  Right  EHL    4/5  4/5 TA    5/5  5/5 GSC    5/5  5/5 Knee extension  5/5  5/5 Knee flexion   5/5  5/5 Hip flexion   4+/5  4+/5  -Sensory exam    Sensation intact to light touch in L3-S1 nerve distributions of bilateral lower extremities   Patient name: Wendy [name] Patient MRN: [id number] Date: 07/29/22

## 2022-07-29 NOTE — Progress Notes (Signed)
Pt received in PACU placed on ventilator for airway protection.  Patient had back surgery today.  Will wean patient as tolerated when she wakes up.

## 2022-07-30 ENCOUNTER — Inpatient Hospital Stay (HOSPITAL_COMMUNITY): Payer: Medicare HMO

## 2022-07-30 DIAGNOSIS — Z978 Presence of other specified devices: Secondary | ICD-10-CM

## 2022-07-30 DIAGNOSIS — E119 Type 2 diabetes mellitus without complications: Secondary | ICD-10-CM

## 2022-07-30 DIAGNOSIS — M48062 Spinal stenosis, lumbar region with neurogenic claudication: Principal | ICD-10-CM

## 2022-07-30 DIAGNOSIS — Z981 Arthrodesis status: Secondary | ICD-10-CM | POA: Diagnosis not present

## 2022-07-30 LAB — CBC WITH DIFFERENTIAL/PLATELET
Abs Immature Granulocytes: 0.12 10*3/uL — ABNORMAL HIGH (ref 0.00–0.07)
Basophils Absolute: 0 10*3/uL (ref 0.0–0.1)
Basophils Relative: 0 %
Eosinophils Absolute: 0 10*3/uL (ref 0.0–0.5)
Eosinophils Relative: 0 %
HCT: 24.6 % — ABNORMAL LOW (ref 36.0–46.0)
Hemoglobin: 8 g/dL — ABNORMAL LOW (ref 12.0–15.0)
Immature Granulocytes: 1 %
Lymphocytes Relative: 11 %
Lymphs Abs: 1.2 10*3/uL (ref 0.7–4.0)
MCH: 26.5 pg (ref 26.0–34.0)
MCHC: 32.5 g/dL (ref 30.0–36.0)
MCV: 81.5 fL (ref 80.0–100.0)
Monocytes Absolute: 1.5 10*3/uL — ABNORMAL HIGH (ref 0.1–1.0)
Monocytes Relative: 13 %
Neutro Abs: 8.3 10*3/uL — ABNORMAL HIGH (ref 1.7–7.7)
Neutrophils Relative %: 75 %
Platelets: 114 10*3/uL — ABNORMAL LOW (ref 150–400)
RBC: 3.02 MIL/uL — ABNORMAL LOW (ref 3.87–5.11)
RDW: 16.1 % — ABNORMAL HIGH (ref 11.5–15.5)
WBC: 11.1 10*3/uL — ABNORMAL HIGH (ref 4.0–10.5)
nRBC: 0 % (ref 0.0–0.2)

## 2022-07-30 LAB — GLUCOSE, CAPILLARY
Glucose-Capillary: 114 mg/dL — ABNORMAL HIGH (ref 70–99)
Glucose-Capillary: 122 mg/dL — ABNORMAL HIGH (ref 70–99)
Glucose-Capillary: 125 mg/dL — ABNORMAL HIGH (ref 70–99)
Glucose-Capillary: 128 mg/dL — ABNORMAL HIGH (ref 70–99)
Glucose-Capillary: 195 mg/dL — ABNORMAL HIGH (ref 70–99)
Glucose-Capillary: 195 mg/dL — ABNORMAL HIGH (ref 70–99)
Glucose-Capillary: 219 mg/dL — ABNORMAL HIGH (ref 70–99)

## 2022-07-30 LAB — POCT I-STAT 7, (LYTES, BLD GAS, ICA,H+H)
Acid-base deficit: 4 mmol/L — ABNORMAL HIGH (ref 0.0–2.0)
Bicarbonate: 20.3 mmol/L (ref 20.0–28.0)
Calcium, Ion: 1.01 mmol/L — ABNORMAL LOW (ref 1.15–1.40)
HCT: 22 % — ABNORMAL LOW (ref 36.0–46.0)
Hemoglobin: 7.5 g/dL — ABNORMAL LOW (ref 12.0–15.0)
O2 Saturation: 98 %
Patient temperature: 98.2
Potassium: 4.4 mmol/L (ref 3.5–5.1)
Sodium: 135 mmol/L (ref 135–145)
TCO2: 21 mmol/L — ABNORMAL LOW (ref 22–32)
pCO2 arterial: 33.3 mmHg (ref 32–48)
pH, Arterial: 7.391 (ref 7.35–7.45)
pO2, Arterial: 104 mmHg (ref 83–108)

## 2022-07-30 LAB — BASIC METABOLIC PANEL
Anion gap: 12 (ref 5–15)
BUN: 15 mg/dL (ref 8–23)
CO2: 20 mmol/L — ABNORMAL LOW (ref 22–32)
Calcium: 7.2 mg/dL — ABNORMAL LOW (ref 8.9–10.3)
Chloride: 102 mmol/L (ref 98–111)
Creatinine, Ser: 0.9 mg/dL (ref 0.44–1.00)
GFR, Estimated: >60 mL/min (ref >60)
Glucose, Bld: 220 mg/dL — ABNORMAL HIGH (ref 70–99)
Potassium: 4.2 mmol/L (ref 3.5–5.1)
Sodium: 134 mmol/L — ABNORMAL LOW (ref 135–145)

## 2022-07-30 LAB — TYPE AND SCREEN
ABO/RH(D): B POS
Antibody Screen: NEGATIVE
Unit division: 0

## 2022-07-30 LAB — BPAM RBC
Blood Product Expiration Date: 202406112359
ISSUE DATE / TIME: 202405282231

## 2022-07-30 MED ORDER — TRANEXAMIC ACID-NACL 1000-0.7 MG/100ML-% IV SOLN
1000.0000 mg | Freq: Once | INTRAVENOUS | Status: AC
  Administered 2022-07-30: 1000 mg via INTRAVENOUS
  Filled 2022-07-30: qty 100

## 2022-07-30 MED ORDER — CYCLOSPORINE 0.05 % OP EMUL
1.0000 [drp] | Freq: Two times a day (BID) | OPHTHALMIC | Status: DC
  Administered 2022-07-30 – 2022-08-08 (×19): 1 [drp] via OPHTHALMIC
  Filled 2022-07-30 (×20): qty 30

## 2022-07-30 MED ORDER — METHOCARBAMOL 1000 MG/10ML IJ SOLN
500.0000 mg | Freq: Three times a day (TID) | INTRAVENOUS | Status: DC
  Administered 2022-07-30 – 2022-08-02 (×8): 500 mg via INTRAVENOUS
  Filled 2022-07-30: qty 5
  Filled 2022-07-30: qty 500
  Filled 2022-07-30: qty 5
  Filled 2022-07-30 (×3): qty 500
  Filled 2022-07-30: qty 5
  Filled 2022-07-30 (×3): qty 500
  Filled 2022-07-30: qty 5

## 2022-07-30 MED ORDER — POTASSIUM CHLORIDE CRYS ER 10 MEQ PO TBCR
10.0000 meq | EXTENDED_RELEASE_TABLET | Freq: Every day | ORAL | Status: DC
  Administered 2022-07-30 – 2022-08-08 (×10): 10 meq via ORAL
  Filled 2022-07-30 (×11): qty 1

## 2022-07-30 MED ORDER — POLYETHYLENE GLYCOL 3350 17 G PO PACK
17.0000 g | PACK | Freq: Two times a day (BID) | ORAL | Status: DC
  Administered 2022-07-30 – 2022-08-02 (×8): 17 g via ORAL
  Filled 2022-07-30 (×7): qty 1

## 2022-07-30 MED ORDER — DULOXETINE HCL 30 MG PO CPEP
30.0000 mg | ORAL_CAPSULE | Freq: Every day | ORAL | Status: DC
  Administered 2022-07-30 – 2022-08-07 (×9): 30 mg via ORAL
  Filled 2022-07-30 (×9): qty 1

## 2022-07-30 MED ORDER — ACETAMINOPHEN 500 MG PO TABS: 1000.0000 mg | ORAL_TABLET | Freq: Three times a day (TID) | ORAL | Status: DC

## 2022-07-30 MED ORDER — ORAL CARE MOUTH RINSE: 15.0000 mL | OROMUCOSAL | Status: DC | PRN

## 2022-07-30 MED ORDER — MIDAZOLAM HCL 2 MG/2ML IJ SOLN: 2.0000 mg | Freq: Once | INTRAMUSCULAR | Status: AC

## 2022-07-30 MED ORDER — MUPIROCIN 2 % EX OINT
1.0000 | TOPICAL_OINTMENT | Freq: Two times a day (BID) | CUTANEOUS | Status: AC
  Administered 2022-07-30 – 2022-08-03 (×10): 1 via NASAL
  Filled 2022-07-30 (×2): qty 22

## 2022-07-30 MED ORDER — AMLODIPINE BESYLATE 10 MG PO TABS
10.0000 mg | ORAL_TABLET | Freq: Every day | ORAL | Status: DC
  Filled 2022-07-30: qty 1

## 2022-07-30 MED ORDER — FUROSEMIDE 10 MG/ML IJ SOLN
40.0000 mg | Freq: Once | INTRAMUSCULAR | Status: AC
  Administered 2022-07-30: 40 mg via INTRAVENOUS
  Filled 2022-07-30: qty 4

## 2022-07-30 MED ORDER — AMLODIPINE BESYLATE 10 MG PO TABS: 10.0000 mg | ORAL_TABLET | Freq: Every day | ORAL | Status: DC

## 2022-07-30 MED ORDER — METHOCARBAMOL 750 MG PO TABS
750.0000 mg | ORAL_TABLET | Freq: Four times a day (QID) | ORAL | Status: DC
  Administered 2022-07-30: 750 mg via ORAL
  Filled 2022-07-30 (×2): qty 2

## 2022-07-30 MED ORDER — HYDROMORPHONE HCL 1 MG/ML IJ SOLN
0.5000 mg | INTRAMUSCULAR | Status: DC | PRN
  Administered 2022-07-30: 0.5 mg via INTRAVENOUS
  Administered 2022-07-30: 1 mg via INTRAVENOUS
  Filled 2022-07-30 (×2): qty 1

## 2022-07-30 MED ORDER — HYDROXYCHLOROQUINE SULFATE 200 MG PO TABS
200.0000 mg | ORAL_TABLET | Freq: Every day | ORAL | Status: DC
  Administered 2022-07-30 – 2022-08-08 (×10): 200 mg via ORAL
  Filled 2022-07-30 (×10): qty 1

## 2022-07-30 MED ORDER — INSULIN DETEMIR 100 UNIT/ML ~~LOC~~ SOLN
5.0000 [IU] | Freq: Every day | SUBCUTANEOUS | Status: DC
  Administered 2022-07-30: 5 [IU] via SUBCUTANEOUS
  Filled 2022-07-30 (×2): qty 0.05

## 2022-07-30 MED ORDER — ATORVASTATIN CALCIUM 10 MG PO TABS
10.0000 mg | ORAL_TABLET | Freq: Every day | ORAL | Status: DC
  Administered 2022-07-30 – 2022-08-08 (×10): 10 mg via ORAL
  Filled 2022-07-30 (×10): qty 1

## 2022-07-30 MED ORDER — TRAZODONE HCL 50 MG PO TABS
50.0000 mg | ORAL_TABLET | Freq: Every day | ORAL | Status: DC
  Administered 2022-07-30 – 2022-08-04 (×6): 50 mg via ORAL
  Filled 2022-07-30 (×6): qty 1

## 2022-07-30 MED ORDER — PROPOFOL 1000 MG/100ML IV EMUL
5.0000 ug/kg/min | INTRAVENOUS | Status: DC
  Administered 2022-07-30: 5 ug/kg/min via INTRAVENOUS

## 2022-07-30 MED ORDER — INSULIN ASPART 100 UNIT/ML IJ SOLN: 0.0000 [IU] | Freq: Every day | INTRAMUSCULAR | Status: DC

## 2022-07-30 MED ORDER — DOCUSATE SODIUM 100 MG PO CAPS
100.0000 mg | ORAL_CAPSULE | Freq: Two times a day (BID) | ORAL | Status: DC
  Administered 2022-07-30 – 2022-08-01 (×6): 100 mg via ORAL
  Filled 2022-07-30 (×6): qty 1

## 2022-07-30 MED ORDER — METFORMIN HCL 500 MG PO TABS: 500.0000 mg | ORAL_TABLET | Freq: Two times a day (BID) | ORAL | Status: DC

## 2022-07-30 MED ORDER — ONDANSETRON HCL 4 MG PO TABS: 4.0000 mg | ORAL_TABLET | Freq: Four times a day (QID) | ORAL | Status: DC | PRN

## 2022-07-30 MED ORDER — OXYCODONE HCL 5 MG PO TABS
5.0000 mg | ORAL_TABLET | ORAL | Status: DC | PRN
  Administered 2022-07-30: 5 mg via ORAL
  Filled 2022-07-30: qty 1

## 2022-07-30 MED ORDER — INSULIN DETEMIR 100 UNIT/ML ~~LOC~~ SOLN
8.0000 [IU] | Freq: Two times a day (BID) | SUBCUTANEOUS | Status: DC
  Administered 2022-07-30 – 2022-08-06 (×15): 8 [IU] via SUBCUTANEOUS
  Filled 2022-07-30 (×20): qty 0.08

## 2022-07-30 MED ORDER — CALCIUM GLUCONATE-NACL 2-0.675 GM/100ML-% IV SOLN
2.0000 g | Freq: Once | INTRAVENOUS | Status: AC
  Administered 2022-07-30: 2000 mg via INTRAVENOUS
  Filled 2022-07-30: qty 100

## 2022-07-30 MED ORDER — INSULIN ASPART 100 UNIT/ML IJ SOLN: 0.0000 [IU] | Freq: Three times a day (TID) | INTRAMUSCULAR | Status: DC

## 2022-07-30 MED ORDER — CEFAZOLIN SODIUM-DEXTROSE 2-4 GM/100ML-% IV SOLN
2.0000 g | Freq: Four times a day (QID) | INTRAVENOUS | Status: AC
  Administered 2022-07-30 (×3): 2 g via INTRAVENOUS
  Filled 2022-07-30 (×3): qty 100

## 2022-07-30 MED ORDER — DOCUSATE SODIUM 100 MG PO CAPS: 100.0000 mg | ORAL_CAPSULE | Freq: Two times a day (BID) | ORAL | Status: DC

## 2022-07-30 MED ORDER — SODIUM CHLORIDE 0.9 % IV SOLN
12.5000 mg | Freq: Four times a day (QID) | INTRAVENOUS | Status: DC | PRN
  Administered 2022-07-30: 12.5 mg via INTRAVENOUS
  Filled 2022-07-30: qty 0.5
  Filled 2022-07-30: qty 12.5

## 2022-07-30 MED ORDER — LACTATED RINGERS IV BOLUS
1000.0000 mL | Freq: Once | INTRAVENOUS | Status: AC
  Administered 2022-07-30: 1000 mL via INTRAVENOUS

## 2022-07-30 MED ORDER — FUROSEMIDE 10 MG/ML IJ SOLN
INTRAMUSCULAR | Status: AC
  Filled 2022-07-30: qty 4

## 2022-07-30 MED ORDER — ONDANSETRON HCL 4 MG/2ML IJ SOLN
4.0000 mg | Freq: Four times a day (QID) | INTRAMUSCULAR | Status: DC | PRN
  Administered 2022-07-30 (×2): 4 mg via INTRAVENOUS
  Filled 2022-07-30 (×2): qty 2

## 2022-07-30 MED ORDER — HYDROMORPHONE HCL 1 MG/ML IJ SOLN
2.0000 mg | INTRAMUSCULAR | Status: DC | PRN
  Administered 2022-07-30 (×2): 2 mg via INTRAVENOUS
  Filled 2022-07-30 (×2): qty 2

## 2022-07-30 MED ORDER — ORAL CARE MOUTH RINSE
15.0000 mL | OROMUCOSAL | Status: DC
  Administered 2022-07-30 (×4): 15 mL via OROMUCOSAL

## 2022-07-30 MED ORDER — LORATADINE 10 MG PO TABS
10.0000 mg | ORAL_TABLET | Freq: Every evening | ORAL | Status: DC
  Administered 2022-07-31 – 2022-08-07 (×8): 10 mg via ORAL
  Filled 2022-07-30 (×8): qty 1

## 2022-07-30 MED ORDER — ACETAMINOPHEN 10 MG/ML IV SOLN
1000.0000 mg | Freq: Three times a day (TID) | INTRAVENOUS | Status: DC
  Administered 2022-07-30 (×2): 1000 mg via INTRAVENOUS
  Filled 2022-07-30 (×3): qty 100

## 2022-07-30 MED ORDER — PROPOFOL 1000 MG/100ML IV EMUL
INTRAVENOUS | Status: AC
  Filled 2022-07-30: qty 100

## 2022-07-30 MED ORDER — FLUTICASONE PROPIONATE 50 MCG/ACT NA SUSP: 1.0000 | Freq: Every day | NASAL | Status: DC | PRN

## 2022-07-30 MED ORDER — PANTOPRAZOLE SODIUM 40 MG PO TBEC
40.0000 mg | DELAYED_RELEASE_TABLET | Freq: Every day | ORAL | Status: DC
  Administered 2022-07-30 – 2022-08-08 (×10): 40 mg via ORAL
  Filled 2022-07-30 (×10): qty 1

## 2022-07-30 MED ORDER — MIDAZOLAM HCL 2 MG/2ML IJ SOLN
INTRAMUSCULAR | Status: AC
  Administered 2022-07-30: 2 mg via INTRAVENOUS
  Filled 2022-07-30: qty 2

## 2022-07-30 MED ORDER — FUROSEMIDE 40 MG PO TABS: 20.0000 mg | ORAL_TABLET | Freq: Every day | ORAL | Status: DC | PRN

## 2022-07-30 MED FILL — Thrombin (Recombinant) For Soln 20000 Unit: CUTANEOUS | Qty: 1 | Status: AC

## 2022-07-30 NOTE — Progress Notes (Signed)
NAME:  Wendy [name], MRN:  [id number], DOB:  [birth year], LOS: 1 ADMISSION DATE:  07/29/2022, CONSULTATION DATE:  5/28 REFERRING MD:  Dr. 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 She has been following with orthopedics for the back pain and has unfortunately failed conservative management including chiropractor, activity modification, narcotics, and physical therapy. She presented 5/28 for elective L 2-3, L3-4, extreme lumbar interbody fusion, L1-4 laminectomies and foraminotomies. T10-L4 posterior spinal fusion. It was a protracted, but uncomplicated procedure. Post-operatively she was slow to arouse from sedation and could not be safely extubated in the PACU. PCCM was asked to consult for medical management.   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   Significant Hospital Events: Including procedures, antibiotic start and stop dates in addition to other pertinent events   5/28 long back surgery. Left on vent post op  Interim History / Subjective:  Patient is tolerating spontaneous breathing trial, will try to extubate Denies any complaint  Objective   Blood pressure 120/75, pulse 67, temperature (!) 97.4 F (36.3 C), temperature source Oral, resp. rate 18, height 5\' 2"  (1.575 m), weight 81.6 kg, SpO2 100 %.    Vent Mode: PCV FiO2 (%):  [40 %-80 %] 40 % Set Rate:  [14 bmp-18 bmp] 18 bmp Vt Set:  [500 mL]  500 mL PEEP:  [5 cmH20] 5 cmH20 Plateau Pressure:  [20 cmH20-21 cmH20] 20 cmH20   Intake/Output Summary (Last 24 hours) at 07/30/2022 0753 Last data filed at 07/30/2022 0730 Gross per 24 hour  Intake 6967.61 ml  Output 2215 ml  Net 4752.61 ml   Filed Weights   07/29/22 0543  Weight: 81.6 kg    Examination: Physical exam: General: Elderly female female, lying on the bed, orally intubated HEENT: [place]/AT, eyes anicteric.  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 T10-L4 posterior spinal fusion Management deferred to orthopedics  DM type II Continue CBG monitoring and SSI with a goal 140-180 Holding home glipizide, metformin  Acute blood loss anemia, perioperative Patient received 1 unit of PRBCs overnight Monitor H&H and transfuse if less than 7  RA Continue plaquenil  HTN HLD Continue amlodipine, atorvastatin  Obesity Diet and exercise counseling provided  Best Practice (right click and "Reselect all SmartList Selections" daily)   Diet/type: NPO speech and swallow evaluation DVT prophylaxis: other - hold with anemia post OP. SCD GI prophylaxis: Protonix Lines: N/A Foley:  N/A Code Status:  full code Last date of multidisciplinary  goals of care discussion [5/29]  Labs   CBC: Recent Labs  Lab 07/29/22 1534  07/29/22 1924 07/29/22 2209 07/30/22 0223 07/30/22 0602  WBC  --   --   --   --  11.1*  NEUTROABS  --   --   --   --  8.3*  HGB 10.9* 8.5* 7.8* 7.5* 8.0*  HCT 32.0* 25.0* 23.0* 22.0* 24.6*  MCV  --   --   --   --  81.5  PLT  --   --   --   --  114*    Basic Metabolic Panel: Recent Labs  Lab 07/29/22 1534 07/29/22 1924 07/29/22 2209 07/30/22 0223 07/30/22 0602  NA 134* 136 135 135 134*  K 3.8 4.0 4.9 4.4 4.2  CL  --   --   --   --  102  CO2  --   --   --   --  20*  GLUCOSE  --   --   --   --  220*  BUN  --   --   --   --  15  CREATININE  --   --   --   --  0.90  CALCIUM  --   --   --   --  7.2*   GFR: Estimated Creatinine Clearance: 53.5 mL/min (by C-G formula based on SCr of 0.9 mg/dL). Recent Labs  Lab 07/30/22 0602  WBC 11.1*    Liver Function Tests: No results for input(s): "AST", "ALT", "ALKPHOS", "BILITOT", "PROT", "ALBUMIN" in the last 168 hours. No results for input(s): "LIPASE", "AMYLASE" in the last 168 hours. No results for input(s): "AMMONIA" in the last 168 hours.  ABG    Component Value Date/Time   PHART 7.391 07/30/2022 0223   PCO2ART 33.3 07/30/2022 0223   PO2ART 104 07/30/2022 0223   HCO3 20.3 07/30/2022 0223   TCO2 21 (L) 07/30/2022 0223   ACIDBASEDEF 4.0 (H) 07/30/2022 0223   O2SAT 98 07/30/2022 0223     Coagulation Profile: No results for input(s): "INR", "PROTIME" in the last 168 hours.  Cardiac Enzymes: No results for input(s): "CKTOTAL", "CKMB", "CKMBINDEX", "TROPONINI" in the last 168 hours.  HbA1C: Hemoglobin A1C  Date/Time Value Ref Range Status  05/16/2022 01:44 PM 6.6 (A) 4.0 - 5.6 % Final   Hgb A1c MFr Bld  Date/Time Value Ref Range Status  07/18/2022 03:02 PM 6.4 (H) 4.8 - 5.6 % Final    Comment:    (NOTE) Pre diabetes:          5.7%-6.4%  Diabetes:              >6.4%  Glycemic control for   <7.0% adults with diabetes   10/08/2021 01:28 PM 7.5 (H) 4.6 - 6.5 % Final    Comment:    Glycemic Control Guidelines  for People with Diabetes:Non Diabetic:  <6%Goal of Therapy: <7%Additional Action Suggested:  >8%     CBG: Recent Labs  Lab 07/29/22 2205 07/29/22 2230 07/29/22 2359 07/30/22 0315 07/30/22 0726  GLUCAP 255* 233* 195* 195* 219*    This patient is critically ill with multiple organ system failure which requires frequent high complexity decision making, assessment, support, evaluation, and titration of therapies. This was completed through the application of advanced monitoring technologies and extensive interpretation of multiple databases.  During this encounter critical care time was devoted to patient care services described in this note for 35 minutes.    Cheri Fowler, MD [facility]  Critical Care See Amion for pager If no response to pager, please call [phone] until 7pm After 7pm, Please call E-link [phone]

## 2022-07-30 NOTE — Progress Notes (Signed)
eLink Physician-Brief Progress Note Patient Name: GINA CERVANTEZ DOB: Jun 06, 1945 MRN: [id number]   Date of Service  07/30/2022  HPI/Events of Note  77 year old female in ICU postop lumbar spine surgery.  She was to slow to arouse from sedation and lethargic could not be safely extubated so she is in the ICU intubated.  eICU Interventions  Patient seen.  Labs and imaging reviewed.  Vent settings adjusted.  Endotracheal tube repositioned.  Very dyssynchronous with the vent and as such sedation had to be adjusted as well.  Mildly hypotensive and is receiving a liter of fluids for hypotension.  To be evaluated in the morning for extubation.     Intervention Category Evaluation Type: New Patient Evaluation  Carilyn Goodpasture 07/30/2022, 1:18 AM

## 2022-07-30 NOTE — Progress Notes (Signed)
Orthopedic Surgery Post-operative Progress Note  Assessment: Patient is a 77 y.o. female who is currently admitted after undergoing L2/3 and L3/4 XLIF, L1-4 laminectomies, T10-pelvis PSIF   Plan: -Operative plans complete -Possible extubation today, will defer to critical care -Needs upright films when able -Drains to be maintained until output slows -Out of bed as tolerated, no brace -No bending/lifting/twisting greater than 10 pounds -PT evaluate and treat -Pain control -Diabetic diet -No chemoprophylaxis for dvt or antiplatelets for 72 hours after surgery -Ancef x2 post-operative doses -Disposition: remain ICU, likely to floor after extubation   Diabetes -diabetic diet -sliding scale insulin  Acute anemia from surgical blood loss -hgb 8.0 this morning  -will continue to monitor  Hypocalcemia -Ca of 7.2 -getting calcium gluconate  HTN -continue home amlodipine  HLD -continue home lipitor  GERD -continue home PPI  Depression -continue home cymbalta  ___________________________________________________________________________   Subjective: Patient was lethargic and unable to protect her own airway so she remained intubated post-operatively and went to the ICU. She is in the ICU this morning. No acute events overnight. Is sedated so no further subjective.  Objective:  General: laying in bed, intubated Neurologic: sedated, follows command with repeat instruction, not answering questions  Respiratory: breathing by ventilator Skin: dressing clear/dry/intact, drains with serosanguinous output  MSK (spine):  -Strength exam      Right  Left  EHL    3/5  3/5 TA    3/5  3/5 GSC    3/5  3/5 Knee extension  3/5  3/5 Hip flexion  3/5  3/5  Was able to show antigravity strength but unable to participate fully with motor exam due to sedation  -Sensory exam    Sensation intact to light touch in L3-S1 nerve distributions of bilateral lower  extremities   Patient name: Wendy [name] Patient MRN: [id number] Date: 07/30/22

## 2022-07-30 NOTE — Anesthesia Postprocedure Evaluation (Signed)
Anesthesia Post Note  Patient: Wendy [name]  Procedure(s) Performed: L2-3, L3-4 EXTREME LUMBAR INTERBODY FUSION / REMOVAL PRIOR INSTRUMENTATION/ L1-4 LAMINECTOMIES AND FORAMINOTOMIES T10-PELVIS INSTRUMENTATION/ T10-L4 POSTERIOR SPINAL FUSION     Patient location during evaluation: SICU Anesthesia Type: General Level of consciousness: sedated Pain management: pain level controlled Vital Signs Assessment: post-procedure vital signs reviewed and stable Respiratory status: patient remains intubated per anesthesia plan Cardiovascular status: stable Postop Assessment: no apparent nausea or vomiting Anesthetic complications: no   No notable events documented.  Last Vitals:  Vitals:   07/30/22 0500 07/30/22 0526  BP: 116/75 (!) 113/92  Pulse: 67   Resp: 18   Temp:    SpO2: 100%     Last Pain:  Vitals:   07/30/22 0321  TempSrc: Axillary  PainSc:                  Kennieth Rad

## 2022-07-30 NOTE — Plan of Care (Signed)
  Problem: Education: Goal: Knowledge of General Education information will improve Description: Including pain rating scale, medication(s)/side effects and non-pharmacologic comfort measures Outcome: Progressing   Problem: Health Behavior/Discharge Planning: Goal: Ability to manage health-related needs will improve Outcome: Progressing   Problem: Clinical Measurements: Goal: Ability to maintain clinical measurements within normal limits will improve Outcome: Progressing Goal: Will remain free from infection Outcome: Progressing Goal: Diagnostic test results will improve Outcome: Progressing Goal: Respiratory complications will improve Outcome: Progressing Goal: Cardiovascular complication will be avoided Outcome: Progressing   Problem: Activity: Goal: Risk for activity intolerance will decrease Outcome: Progressing   Problem: Nutrition: Goal: Adequate nutrition will be maintained Outcome: Progressing   Problem: Coping: Goal: Level of anxiety will decrease Outcome: Progressing   Problem: Elimination: Goal: Will not experience complications related to bowel motility Outcome: Progressing Goal: Will not experience complications related to urinary retention Outcome: Progressing   Problem: Pain Managment: Goal: General experience of comfort will improve Outcome: Progressing   Problem: Safety: Goal: Ability to remain free from injury will improve Outcome: Progressing   Problem: Skin Integrity: Goal: Risk for impaired skin integrity will decrease Outcome: Progressing   Problem: Education: Goal: Ability to describe self-care measures that may prevent or decrease complications (Diabetes Survival Skills Education) will improve Outcome: Progressing Goal: Individualized Educational Video(s) Outcome: Progressing   Problem: Coping: Goal: Ability to adjust to condition or change in health will improve Outcome: Progressing   Problem: Fluid Volume: Goal: Ability to  maintain a balanced intake and output will improve Outcome: Progressing   Problem: Health Behavior/Discharge Planning: Goal: Ability to identify and utilize available resources and services will improve Outcome: Progressing Goal: Ability to manage health-related needs will improve Outcome: Progressing   Problem: Metabolic: Goal: Ability to maintain appropriate glucose levels will improve Outcome: Progressing   Problem: Nutritional: Goal: Maintenance of adequate nutrition will improve Outcome: Progressing Goal: Progress toward achieving an optimal weight will improve Outcome: Progressing   Problem: Skin Integrity: Goal: Risk for impaired skin integrity will decrease Outcome: Progressing   Problem: Tissue Perfusion: Goal: Adequacy of tissue perfusion will improve Outcome: Progressing   Problem: Activity: Goal: Ability to tolerate increased activity will improve Outcome: Progressing   Problem: Respiratory: Goal: Ability to maintain a clear airway and adequate ventilation will improve Outcome: Progressing   Problem: Role Relationship: Goal: Method of communication will improve Outcome: Progressing   Problem: Safety: Goal: Non-violent Restraint(s) Outcome: Progressing   

## 2022-07-30 NOTE — Evaluation (Signed)
Occupational Therapy Evaluation Patient Details Name: Wendy [name] MRN: [id number] DOB: January 16, 1946 Today's Date: 07/30/2022   History of Present Illness 77 y.o. female presents to [facility] hospital on 07/29/2022 with low back pain radiating into BLE. 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 Pt underwent L2-4 XLIF, L1-4 laminectomies, T10-pelvis PSIF 5/28. Pt extubated on 5/29. PMH includes HLD, HTN, GERD, depression, anxiety, chronic pain, DM.   Clinical Impression   PTA, pt lived with her friend and independent with ADL; inconsistent report regarding assist level with IADL. Upon eval, pt performing UB ADL with min guard A and LB ADL with up to max A with cues for precautions. Pt with decreased memory, awareness, safety, problem solving, strength, and balance affecting safe participation in ADL. Pt motivated to return to PLOF and able to stand EOB x3 this session. Due to significant change in functional status, support, and motivation, recommending continued post acutely >3 hours/day. Will follow acutely.      Recommendations for follow up therapy are one component of a multi-disciplinary discharge planning process, led by the attending physician.  Recommendations may be updated based on patient status, additional functional criteria and insurance authorization.   Assistance Recommended at Discharge Intermittent Supervision/Assistance  Patient can return home with the following A lot of help with walking and/or transfers;A lot of help with bathing/dressing/bathroom;Assistance with cooking/housework;Help with stairs or ramp for entrance;Direct supervision/assist for financial management;Direct supervision/assist for medications management;Assist for transportation    Functional Status Assessment  Patient has had a recent decline in their functional status and demonstrates the ability to make significant improvements in function in a reasonable and predictable amount of time.  Equipment Recommendations  Other  (comment) (defer)    Recommendations for Other Services Rehab consult     Precautions / Restrictions Precautions Precautions: Fall;Back Precaution Booklet Issued: Yes (comment) Restrictions Weight Bearing Restrictions: No      Mobility Bed Mobility Overal bed mobility: Needs Assistance Bed Mobility: Rolling, Sidelying to Sit, Sit to Sidelying Rolling: Min assist, +2 for physical assistance Sidelying to sit: Min assist, +2 for physical assistance, HOB elevated     Sit to sidelying: Min assist, +2 for physical assistance General bed mobility comments: light assist and cueing for all portions.    Transfers Overall transfer level: Needs assistance Equipment used: 2 person hand held assist, Rolling walker (2 wheels) Transfers: Sit to/from Stand Sit to Stand: Mod assist, +2 physical assistance, From elevated surface (modA x2 progressing to minA x2 with RW in 3rd transfer attempt)           General transfer comment: assist for stability and to ascend into standing due to LE weakness      Balance Overall balance assessment: Needs assistance Sitting-balance support: Single extremity supported, Feet supported Sitting balance-Leahy Scale: Poor Sitting balance - Comments: minG   Standing balance support: Bilateral upper extremity supported Standing balance-Leahy Scale: Poor Standing balance comment: minA x2 with RW                           ADL either performed or assessed with clinical judgement   ADL Overall ADL's : Needs assistance/impaired     Grooming: Min guard;Sitting   Upper Body Bathing: Min guard;Sitting   Lower Body Bathing: Maximal assistance   Upper Body Dressing : Min guard;Sitting   Lower Body Dressing: Maximal assistance;Sit to/from stand   Toilet Transfer: Minimal assistance;+2 for safety/equipment;+2 for physical assistance;Rolling walker (2 wheels);Stand-pivot;Moderate assistance;BSC/3in1  Functional mobility during  ADLs: Minimal assistance;Moderate assistance;+2 for physical assistance;+2 for safety/equipment       Vision   Vision Assessment?: No apparent visual deficits     Perception     Praxis      Pertinent Vitals/Pain Pain Assessment Pain Assessment: Faces Faces Pain Scale: Hurts even more Pain Location: back Pain Descriptors / Indicators: Grimacing Pain Intervention(s): Limited activity within patient's tolerance, Monitored during session     Hand Dominance     Extremity/Trunk Assessment Upper Extremity Assessment Upper Extremity Assessment: RUE deficits/detail;LUE deficits/detail RUE Deficits / Details: Pt reports s/p rotator cuff tear repair. Not fully assessed this session LUE Deficits / Details: Pt reports rotator cuff repair. Not fully assessed this session   Lower Extremity Assessment Lower Extremity Assessment: Defer to PT evaluation   Cervical / Trunk Assessment Cervical / Trunk Assessment: Back Surgery   Communication Communication Communication: No difficulties   Cognition Arousal/Alertness: Awake/alert Behavior During Therapy: Flat affect Overall Cognitive Status: Impaired/Different from baseline Area of Impairment: Memory, Awareness, Safety/judgement, Problem solving                     Memory: Decreased short-term memory, Decreased recall of precautions   Safety/Judgement: Decreased awareness of deficits, Decreased awareness of safety Awareness: Emergent Problem Solving: Requires verbal cues General Comments: unable to recall precautions after initial education, and requiring cues to maintain throughout. Pt requriing multimodal cues for sequencing of transfers.     General Comments  VSS on RA    Exercises     Shoulder Instructions      Home Living Family/patient expects to be discharged to:: Private residence Living Arrangements: Non-relatives/Friends Available Help at Discharge: Family;Friend(s);Available 24 hours/day Type of Home:  House Home Access: Stairs to enter [employer] of Steps: 1+1 Entrance Stairs-Rails: None Home Layout: Multi-level Alternate Level Stairs-Number of Steps: 1 step down to reach bathroom   Bathroom Shower/Tub: Chief Strategy Officer: Standard     Home Equipment: Agricultural consultant (2 wheels);Cane - single point          Prior Functioning/Environment Prior Level of Function : Needs assist             Mobility Comments: ambulatory with SPC ADLs Comments: pt reports performing most ADLs independently, later seems to indicate some assistance with IADLs from her son        OT Problem List: Decreased strength;Impaired balance (sitting and/or standing);Decreased cognition;Decreased safety awareness;Decreased knowledge of use of DME or AE;Decreased knowledge of precautions;Obesity      OT Treatment/Interventions: Self-care/ADL training;Therapeutic exercise;DME and/or AE instruction;Balance training;Patient/family education;Cognitive remediation/compensation;Therapeutic activities    OT Goals(Current goals can be found in the care plan section) Acute Rehab OT Goals Patient Stated Goal: get better OT Goal Formulation: With patient Time For Goal Achievement: 08/13/22 Potential to Achieve Goals: Good  OT Frequency: Min 2X/week    Co-evaluation PT/OT/SLP Co-Evaluation/Treatment: Yes Reason for Co-Treatment: Complexity of the patient's impairments (multi-system involvement);Necessary to address cognition/behavior during functional activity;For patient/therapist safety;To address functional/ADL transfers PT goals addressed during session: Mobility/safety with mobility;Balance;Proper use of DME;Strengthening/ROM OT goals addressed during session: ADL's and self-care      AM-PAC OT "6 Clicks" Daily Activity     Outcome Measure Help from another person eating meals?: None Help from another person taking care of personal grooming?: A Little Help from another person  toileting, which includes using toliet, bedpan, or urinal?: A Lot Help from another person bathing (including washing, rinsing, drying)?: A  Lot Help from another person to put on and taking off regular upper body clothing?: A Little Help from another person to put on and taking off regular lower body clothing?: A Lot 6 Click Score: 16   End of Session Nurse Communication: Mobility status  Activity Tolerance: Patient tolerated treatment well;Other (comment) (nausea) Patient left: in bed;with call bell/phone within reach  OT Visit Diagnosis: Unsteadiness on feet (R26.81);Muscle weakness (generalized) (M62.81);Other abnormalities of gait and mobility (R26.89);Other symptoms and signs involving cognitive function                Time: 1320-1345 OT Time Calculation (min): 25 min Charges:  OT General Charges $OT Visit: 1 Visit OT Evaluation $OT Eval Low Complexity: 1 Low  Tyler Deis, OTR/L [facility] Acute Rehabilitation Office: [phone]   Myrla Halsted 07/30/2022, 4:04 PM

## 2022-07-30 NOTE — Evaluation (Signed)
Physical Therapy Evaluation Patient Details Name: Wendy [name] MRN: [id number] DOB: December 25, 1945 Today's Date: 07/30/2022  History of Present Illness  77 y.o. female presents to [facility] hospital on 07/29/2022 with low back pain radiating into BLE. 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 Pt underwent L2-4 XLIF, L1-4 laminectomies, T10-pelvis PSIF 5/28. Pt extubated on 5/29. PMH includes HLD, HTN, GERD, depression, anxiety, chronic pain, DM.  Clinical Impression  Pt presents to PT with deficits in functional mobility, gait, balance, strength, power, endurance. Pt demonstrates BLE weakness, with instability in standing and with pre-gait activities requiring physical assistance to stabilize. With consecutive transfer attempts pt begins to require less assistance. Pt will benefit from frequent mobilization in an effort to improve mobility quality and to reduce falls risk. PT recommends high intensity inpatient PT services at the time of discharge to aide in a return to independence.       Recommendations for follow up therapy are one component of a multi-disciplinary discharge planning process, led by the attending physician.  Recommendations may be updated based on patient status, additional functional criteria and insurance authorization.  Follow Up Recommendations       Assistance Recommended at Discharge Intermittent Supervision/Assistance  Patient can return home with the following  A lot of help with walking and/or transfers;A lot of help with bathing/dressing/bathroom;Assistance with cooking/housework;Assist for transportation;Help with stairs or ramp for entrance    Equipment Recommendations BSC/3in1  Recommendations for Other Services  Rehab consult    Functional Status Assessment Patient has had a recent decline in their functional status and demonstrates the ability to make significant improvements in function in a reasonable and predictable amount of time.     Precautions / Restrictions  Precautions Precautions: Fall;Back Precaution Booklet Issued: Yes (comment) Restrictions Weight Bearing Restrictions: No      Mobility  Bed Mobility Overal bed mobility: Needs Assistance Bed Mobility: Rolling, Sidelying to Sit, Sit to Sidelying Rolling: Min assist, +2 for physical assistance Sidelying to sit: Min assist, +2 for physical assistance, HOB elevated     Sit to sidelying: Min assist, +2 for physical assistance      Transfers Overall transfer level: Needs assistance Equipment used: 2 person hand held assist, Rolling walker (2 wheels) Transfers: Sit to/from Stand Sit to Stand: Mod assist, +2 physical assistance, From elevated surface (modA x2 progressing to minA x2 with RW in 3rd transfer attempt)           General transfer comment: assist for stability and to ascend into standing due to LE weakness    Ambulation/Gait             Pre-gait activities: pt takes one step forward,backward, widened BOS, increased knee flexion with mild buckling intermittently during standing    Stairs            Wheelchair Mobility    Modified Rankin (Stroke Patients Only)       Balance Overall balance assessment: Needs assistance Sitting-balance support: Single extremity supported, Feet supported Sitting balance-Leahy Scale: Poor Sitting balance - Comments: minG   Standing balance support: Bilateral upper extremity supported Standing balance-Leahy Scale: Poor Standing balance comment: minA x2 with RW                             Pertinent Vitals/Pain Pain Assessment Pain Assessment: Faces Faces Pain Scale: Hurts even more Pain Location: back Pain Descriptors / Indicators: Grimacing Pain Intervention(s): Monitored during session    Home Living Family/patient  expects to be discharged to:: Private residence Living Arrangements: Non-relatives/Friends Available Help at Discharge: Family;Friend(s);Available 24 hours/day Type of Home:  House Home Access: Stairs to enter Entrance Stairs-Rails: None Entrance Stairs-Number of Steps: 1+1 Alternate Level Stairs-Number of Steps: 1 step down to reach bathroom Home Layout: Multi-level Home Equipment: Agricultural consultant (2 wheels);Cane - single point      Prior Function Prior Level of Function : Needs assist             Mobility Comments: ambulatory with SPC ADLs Comments: pt reports performing most ADLs independently, later seems to indicate some assistance with IADLs     Hand Dominance        Extremity/Trunk Assessment   Upper Extremity Assessment Upper Extremity Assessment: Defer to OT evaluation    Lower Extremity Assessment Lower Extremity Assessment: Generalized weakness    Cervical / Trunk Assessment Cervical / Trunk Assessment: Back Surgery  Communication   Communication: No difficulties  Cognition Arousal/Alertness: Awake/alert Behavior During Therapy: Flat affect Overall Cognitive Status: Impaired/Different from baseline Area of Impairment: Memory, Awareness, Safety/judgement, Problem solving                     Memory: Decreased short-term memory, Decreased recall of precautions   Safety/Judgement: Decreased awareness of deficits, Decreased awareness of safety Awareness: Emergent Problem Solving: Requires verbal cues          General Comments General comments (skin integrity, edema, etc.): VSS on RA    Exercises     Assessment/Plan    PT Assessment Patient needs continued PT services  PT Problem List Decreased strength;Decreased activity tolerance;Decreased balance;Decreased mobility;Decreased knowledge of use of DME;Decreased knowledge of precautions;Decreased safety awareness;Pain       PT Treatment Interventions DME instruction;Gait training;Stair training;Functional mobility training;Therapeutic activities;Therapeutic exercise;Balance training;Neuromuscular re-education;Patient/family education    PT Goals (Current goals  can be found in the Care Plan section)  Acute Rehab PT Goals Patient Stated Goal: to return to independence PT Goal Formulation: With patient Time For Goal Achievement: 08/13/22 Potential to Achieve Goals: Good    Frequency Min 5X/week     Co-evaluation PT/OT/SLP Co-Evaluation/Treatment: Yes Reason for Co-Treatment: Complexity of the patient's impairments (multi-system involvement);Necessary to address cognition/behavior during functional activity;For patient/therapist safety;To address functional/ADL transfers PT goals addressed during session: Mobility/safety with mobility;Balance;Proper use of DME;Strengthening/ROM         AM-PAC PT "6 Clicks" Mobility  Outcome Measure Help needed turning from your back to your side while in a flat bed without using bedrails?: A Little Help needed moving from lying on your back to sitting on the side of a flat bed without using bedrails?: A Lot Help needed moving to and from a bed to a chair (including a wheelchair)?: A Lot Help needed standing up from a chair using your arms (e.g., wheelchair or bedside chair)?: A Lot Help needed to walk in hospital room?: Total Help needed climbing 3-5 steps with a railing? : Total 6 Click Score: 11    End of Session   Activity Tolerance: Patient tolerated treatment well Patient left: in bed;with call bell/phone within reach;with bed alarm set Nurse Communication: Mobility status PT Visit Diagnosis: Other abnormalities of gait and mobility (R26.89);Muscle weakness (generalized) (M62.81);Pain Pain - part of body:  (back)    Time: 5784-6962 PT Time Calculation (min) (ACUTE ONLY): 37 min   Charges:   PT Evaluation $PT Eval Moderate Complexity: 1 Mod          Arlyss Gandy, PT,  DPT Acute Rehabilitation Office [phone]   Arlyss Gandy 07/30/2022, 3:54 PM

## 2022-07-30 NOTE — Op Note (Signed)
Orthopedic Spine Surgery Operative Report  Procedure: L2/3 and L3/4 lateral lumbar interbody arthrodesis with insertion of interbody device Application of demineralized bone matrix  Modifier: none  Date of procedure: 07/29/2022  Patient name: Wendy [name] MRN: [id number] DOB: [birth year]  Surgeon: Willia Craze, MD Assistant: None Pre-operative diagnosis: sagittal imbalance, lumbar stenosis Post-operative diagnosis: same as above Findings: L2/3 and L3/4 degenerative disc with height loss  Specimens: none Anesthesia: general EBL: 30cc Complications: none Pre-incision antibiotic: ancef TXA given prior to incision as well  Implants:  L2/3: 10x18x37mm 10 degree Nuvasive titanium cage L3/4: 8x18x78mm 10 degree Nuvasive titanium cage   Indication for procedure: Patient is a 77 y.o. female who presented to the office with symptoms of low back pain radiating into her bilateral legs. The patient had tried conservative treatments that did not provide any lasting relief. As result, operative management was discussed. The pre-operative MRI showed L1-4 stenosis. 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 In order to address the symptoms, lateral lumbar interbody arthrodesis at L2/3 and L3/4 was discussed as a part of one of the treatment options. The risks including but not limited to dural tear, nerve root injury, paralysis, pseudarthrosis, persistent pain, infection, bleeding, hardware failure, adjacent segment disease, vascular injury, psoas weakness, lumbar plexus injury, heart attack, death, stroke, fracture, and need for additional procedures were discussed with the patient. The benefit of the surgery would be improvement in the patient's alignment. The alternatives to surgical management were covered with the patient and included continued monitoring, physical therapy, over-the-counter pain medications, ambulatory aids, steroid injections, and activity modification. All the patient's  questions were answered to her satisfaction. After this discussion, the patient expressed understanding and elected to proceed with surgical intervention.  Procedure Description: The patient was met in the pre-operative holding area. The patient's identity and consent were verified. The operative site was marked. The patient's remaining questions about the surgery were answered. The patient was brought back to the operating room. General anesthesia was induced and an endotracheal tube was placed by the anesthesia staff. Neuromonitoring leads were attached to the patient by the neuromonitoring technologist. The patient was transferred to the prone [place] table in the prone position. All bony prominences were well padded. The head of the bed was slightly elevated and the eyes were free from compression by the face pillow. The surgical area was cleansed with alcohol.  Baseline neuromonitoring signals were obtained. Fluoroscopy was then brought in to check rotation on the AP image and to mark the anterior, posterior 1/3, and posterior aspects of the disc spaces on the lateral image. The patient's skin was then prepped and draped in a standard, sterile fashion. A time out was performed that identified the patient, the procedure, and the operative levels. All team members agreed with what was stated in the time out.  An oblique incision over the abdominal wall was made connecting the two marked out disc spaces. Incision was carried down sharply through the skin and dermis. Electrocautery was used to continue the dissection through the subcutaneous tissue and fat to the level of the fascia overlying the external oblique muscle. Electrocautery was used to make an incision through the fascia in line with the muscle fibers. Metzenbaum scissors were used to dissect in line with the muscle fibers of the external oblique, the internal oblique, and then the transversus abdominis muscles. Metzenbaum scissors were used to open  the transversalis fascia. A sponge stick and long kidner were utilized to sweep the retroperitoneal fat ventral  to eventually visualize the psoas muscle.   The initial dilator was placed on the psoas fibers. An AP and lateral fluoroscopic image were taken to confirm dilator was at correct level and in proper position. The dilator was stimulated at that level and no reading was under 8 in any direction. The dilator was then rotated to bluntly advance it through the psoas muscle. The position of the dilator was checked and adjusted under fluoroscopy until it was in the proper position near the middle of the disc space. The dilator was again stimulated and no reading was under 8 in any direction. A K wire was advanced through the dilator into the disc space to secure the dilator's position. Sequential dilators were placed over the initial dilator and rotated to bluntly dissect to the level of the disc space. Each time a new dilator was placed, it was stimulated in all directions and no readings were under 8. The retractor was then advanced over the dilators to the level of the disc space. Fluoroscopy was used to adjust and align the retractor to be parallel to the disc space. The retractor was locked into place using the bed attachment. The position of the retractor was checked again on AP and lateral fluoroscopy. The retractor was in a satisfactory position. The stimulator probe was used to test in all directors, including under the posterior retractor blade, and no reading was under 8. The shim was placed and advanced down the retractor into the disc space. The dilators and K wire were removed. The cranial/caudal aspect of the retractor was opened and the anterior blade of the retractor was advanced ventrally by six clicks. A bipolar and long kidner were used to remove the remaining soft tissue off of the disc space. A rectangular annulotomy was made in the disc space. A pituitary was used to remove some of the  disc. A Cobb was placed into the disc space and advanced along the endplate to clear cartilaginous and disc material off of the endplate under fluoroscopic guidance. The Cobb was impacted to advance it through the annulus on the contralateral side. The Cobb was rotated and the other end plate was prepared in the same fashion. More disc was removed with a pituitary. Then, a combination of ringer curettes, straight curettes, curved curettes, and pituitaries were used to remove the remaining disc until the endplates were well prepared with cartilaginous and disc material removed off of them. A series of trials were advanced across the disc space under AP fluoroscopic guidance until a good fit was obtained. This occurred with a 8mm trial. Neuromonitoring signals were checked and were unchanged from baseline. This size interbody was selected for implantation (see implant section above for specifics). Demineralized bone matrix was packed into interbody device. The interbody device was placed into the disc space and advanced under fluoroscopic guidance until it was across the disc space. AP and lateral fluoroscopic images were taken and confirmed satisfactory position of the implant. Neuromonitoring signals were checked and were unchanged from baseline. The retractor blades were then closed for a total retractor time of .   The retractor blades were removed and the same process in the above paragraph was repeated to place an interbody device at L2/3. The only differences were than it took of retractor time, a good it was obtained with a 10mm trial, and the retractor moved ventrally with gravity so the interbody and trials were placed in a more ventral position. They were contained within the disc  space. A plate was added during the final implantation of the cage for added stability. The retractor was then slowly removed and any bleeding was coagulated with a bipolar. Final AP and lateral fluoroscopic images  were taken and confirmed satisfactory position of the implants.   The transversalis fascia was reapproximated with 0 vicryl. The internal and external oblique muscular layers were reapproximated with 0 vicryl. The fascia overlying the external oblique muscle was reapproximated with 0 vicryl. The deep dermal layer was reapproximated with 2-0 vicryl. The skin was closed with 3-0 monocryl. The incision was dressed with dermabond and an island dressing.   Post-operative plan: The patient remained intubated and in the operating room after this procedure as there was plan for further posterior surgery. The drapes were left in place since the posterior spine was draped in at the beginning of this procedure. The posterior portion of the case was completed in the same trip to the operating room and will be dictated separately.   Willia Craze, MD Orthopedic Surgeon

## 2022-07-30 NOTE — Op Note (Incomplete)
Orthopedic Spine Surgery Operative Report  Procedure: L1, L2, L3, L4 lumbar laminectomies with medial facetectomies T10-L4 posterior lumbar spinal arthrodesis Insertion of pelvic screw fixation bilaterally T10, T11, T12, L1, L2, L3, L5, S1 pedicle screw instrumentation with rods Application of locally harvested autograft (spinous processes, removed lamina) Application of allograft morselized bone and demineralized bone matrix  Modifier: none  Date of procedure: 07/29/2022  Patient name: Wendy [name] MRN: [id number] DOB: 22-Dec-1945  Surgeon: Willia Craze, MD Assistant: None Pre-operative diagnosis: lumbar stenosis with neurogenic claudication, sagittal imbalance Post-operative diagnosis: same as above Findings: L2/3 and L3/4 hypertrophic facets, hypertrophied ligamentum at L1/2, L2/3, L3/4, calcification of the ligamentum with adhesion to the dura at L1/2 and L3/4, solid fusion from L4-S1  Specimens: none Anesthesia: general EBL: 650cc Complications: none Pre-incision antibiotic: ancef  Implants:  Implant Name Type Inv. Item Serial No. Manufacturer Lot No. LRB No. Used Action  MIX DBX 10CC 35% BONE - [id number] Bone Implant MIX DBX 10CC 35% BONE [id number] [employer]ANS[employer] FNDN  N/A 1 Implanted  CAGE MODULUS XL [id number] - 10 - [id number] Cage CAGE MODULUS XL [id number] - 10  [employer] [id number] N/A 1 Implanted  PLATE XLIF MODULUS 10 - [id number] Plate PLATE XLIF MODULUS 10  [employer] [id number] N/A 1 Implanted  CAGE MODULUS XL [id number] - 10 - [id number] Cage CAGE MODULUS XL [id number] - 10  [employer] [id number] N/A 1 Implanted  SCREW RELINE-O POLY 5.5X40 - [id number] Screw SCREW RELINE-O POLY 5.5X40  [employer]  N/A 2 Implanted  SCREW RELINE-O POLY 5.5X45MM - [id number] Screw SCREW RELINE-O POLY 5.5X45MM  [employer]  N/A 2 Implanted  SCREW RELINE-O POLY 6.5X40 - [id number] Screw SCREW RELINE-O POLY 6.5X40  [employer]  N/A 2 Implanted  SCREW RELINE-O  POLY 6.5X45 - [id number] Screw SCREW RELINE-O POLY 6.5X45  [employer]  N/A 6 Implanted  SCREW RELINE-O POLY 7.5X40 - [id number] Screw SCREW RELINE-O POLY 7.5X40  [employer]  N/A 2 Implanted  SCREW LOCK RELINE 5.5 [place] - [id number] Screw SCREW LOCK RELINE 5.5 TULIP  [employer]  N/A 19 Implanted  ROD RELINE-O 5.5X300MM STRT - [id number] Rod ROD RELINE-O 5.5X300MM STRT  [employer]  N/A 2 Implanted  PLATE XLIF MODULUS 10 - [id number] Plate PLATE XLIF MODULUS 10  [employer]  N/A 1 Implanted  SCREW CERV RELINE PA 8.5X70 - [id number] Screw SCREW CERV RELINE PA 8.5X70  [employer]  N/A 2 Implanted  BONE FIBERS PLIAFX 10 - [id number] Bone Implant BONE FIBERS PLIAFX 10 [id number] [facility]  N/A 1 Implanted  BONE FIBERS PLIAFX 10 - [id number] Bone Implant BONE FIBERS PLIAFX 10 [id number] [employer]  N/A 1 Implanted  BONE FIBERS PLIAFX 10 - [id number] Bone Implant BONE FIBERS PLIAFX 10 [id number] [employer]  N/A 1 Implanted      Indication for procedure: Patient is a 77 y.o. female who presented to the office with symptoms consistent with neurogenic claudication. The patient had tried conservative treatments that did not provide any lasting relief. As result, operative management was discussed. The pre-operative MRI showed stenosis from L1-L4 above her prior fusion. She also had sagittal imbalance with PI-LL mismatch. A L1, L2, L3, L4 laminectomy, L2/3 and L3/4 XLIF, and T10-pelvis posterior instrumented spinal fusion was presented as a treatment option. The risks including but not limited to dural tear, nerve root injury, paralysis, persistent pain, pseudarthrosis, infection, bleeding, hardware failure, adjacent segment disease, proximal junctional kyphosis, heart attack, death, stroke, fracture, blindness, and need for additional  procedures were discussed with the patient. The benefit of the surgery would be relief of the patient's radiating leg pain. Explained that back  pain relief is not reliable with this surgery although she may get some improvement in terms of pain from correction of her sagittal imbalance. The alternatives to surgical management were covered with the patient and included continued monitoring, physical therapy, over-the-counter pain medications, ambulatory aids, repeat injections, and activity modification. All the patient's questions were answered to her satisfaction. After this discussion, the patient expressed understanding and elected to proceed with surgical intervention.    Procedure Description: The patient had previously been positioned and the posterior thoracolumbar had been prepped and draped. The neuromonitoring leads and baseline signals had already been obtained as part of the XLIF procedure. At this point, the XLIF portion had been completed and time out had already been performed. I had the anesthesiologist check the eyes and make sure that they were still free from compression by the face pillow. The head of the bed was slightly elevated for this procedure. alcohol. Fluoroscopy was then brought in to check rotation on the AP image and to mark the levels on the lateral image.   A midline incision over the spinous processes of the previously marked levels was made and sharp dissection was continued down through the skin and dermis. Electrocautery was then used to continue the midline dissection down to the level of the spinous process. A 1mm cuff of tissue was preserved around the spinous processes at the cranial aspect of the wound. Subperiosteal dissection was performed using electrocautery to expose the lamina and transverse processes at the lower thoracic spine. A lateral fluoroscopic image was taken to confirm the T12 level. From there, subperiosteal dissection with electrocautery was then done to expose all the lamina and transverse processes at T11 and T10. Then, electrocautery was used to continue the subperiosteal dissection to  expose the lamina, facet joints, pars, and transverse processes at L1, L2, L3. The cranial aspect of L4 was exposed with electrocautery. Electrocautery was then used to carry the more caudal incisions down to the approximate level of the former lamina at L4, L5, and S1. The dissection was then taken out laterally towards the prior instrumentation to avoid a durotomy at the prior laminectomy sites. The prior instrumentation was found and exposed at L4, L5, and S1. There was bridging bone mass seen between these levels. Finally, the cranial sacrum distal to the S1 screw was exposed with electrocautery in the area of the planned S2AI start point on both sides. Aquamantis was used to achieve hemostasis during the exposure.   The prior instrumentation was removed next. The locking nuts were removed off of the L4, L5, and S1 screws bilaterally. Next, the plates were successfully removed bilaterally. A rongeur was used to test the screws purchase and look for intersegment motion. The screws still had good purchase and no intersegment motion was seen. The L5 and S1 screws were able to be removed bilaterally with the medtronic screw drivers. The L4 screws were stripped bilaterally. Several different drivers were tried and light malleting of the drivers to get some purchase was tried. Then, a pliers was used to try to manually rotate and back the screw out. None of these methods were able to back the screw out. The screw post from this old system was proud and in the area where the final rod would be, so decision was made to use a metal cutting burr to remove the proud  portion of these screws bilaterally. Sterile lubrication gel was applied over the screws. The metal cutting burr was used to remove the proud portion of the L4 screws bilaterally. 1L of sterile saline was poured into the wound to clear the metal debris. The L5 and S1 screws did not show any breaches on the pre-operative CT. The screw tracks were palpated with  a ballpoint feeler and confirmed no breaches. The previous screws were 6.5x40 at L5 and 7.5 by 35 at S1. New screws were placed following the same trajectory as the prior screws but were upsized by 1mm in diameter. So, bilateral 7.5x83mm screws were placed at L5 and 8.5x46mm at S1.  Next, the new pedicle screws were placed. The starting points for the pedicle screws were identified using anatomical landmarks. Fluoroscopy was brought in in the AP view to confirm the start points. Each pedicle screw was placed under AP fluoroscopy. A true AP was obtained of the vertebra where screw was being placed. Then, a pilot hole was made with a 3mm matchstick burr at the start point. A pedicle finder was advanced into the pilot hole. AP fluoroscopy was used to confirm proper starting point. A image was taken when the pedicle finder had been advanced 10mm, 15mm, and at 20mm. The pedicle finder remained within the pedicle and did not breach medially at any of those markers. The pedicle finder was then advanced to 35mm and then withdrawn. A feeler was placed into the hole and confirmed no pedicle wall breaches. It was advanced to the ventral cortex by palpation. The screw length was then estimated off of the depth of the feeler. The widths of the screws had been predetermined as they were measured pre-operatively on the patient's advanced imaging. A tap measuring 1mm less in diameter then the planned screw was advanced down the same trajectory as the pedicle finder under the same fluoroscopic guidance. The track was again palpated to confirm no breaches using a ballpoint feeler. The measured length using the feeler and pre-determined width screw was then inserted under AP fluoroscopic guidance. The screw was advanced approximately 10mm, 15mm, and then 20mm. At each one of those intervals an image was taken to confirm that the screw was following the trajectory of the pedicle and had not breached medially. The screw was then  advanced fully until there was good purchase. This process was repeated for every pedicle screw.     The screws that were inserted were NuVasive Reline:               Left                  Right T10 6.5x40  6.5x40 T11 6.5x45  6.5x45 T12 6.5x45  6.5x45 L1 6.5x45  6.5x45 L2 5.5x45  5.5x45 L3 5.5x40  5.5x40 L4       -        - L5 7.5x40  7.5x40 S1 8.5x35  8.5x35   AP and lateral fluoroscopic images were then taken to visualize all the screws. The screws were in satisfactory position. All the pedicle screws were stimulated bilaterally and no screw stimulated under 20.   A tear drop view was then obtained using the C arm. An awl was placed in line with the S1 screw at the S2AI start point. A fluoroscopic image was obtained to confirm proper start point. The awl was then advanced to the about 70mm under fluoroscopic guidance. The awl remained within the tear drop throughout while it was  advanced to 70mm. At 70mm it was near the cortex, so it was not advanced further. A guidewire was then placed through the awl. The awl was removed. A 7.67mm tap was placed over the guidewire and was used to tap the same trajectory. The tap was removed and a 8.5x71mm pelvic screw was inserted over the guide wire and was advanced until their was good bony purchase. Fluoroscopy confirmed satisfactory position of that screw. The same processes was then repeated for the contralateral side.   A rongeur was used to remove the spinous processes and interspinous ligaments from the T12/L1 interspinous ligament to the cranial aspect of the L4 spinous process. The spinous processes, but not the interspinous ligaments or soft tissue, was saved and morselized on the back table. Bone wax was used to obtain hemostasis at the bleeding bony surfaces. A high-speed burr was used to thin the lamina at L1, L2, L3, and the cranial aspect of L4 to the level of the ligamentum flavum. Above the level of the ligamentum, the lamina was thinned with  the burr to the approximate level of the ligamentum. A series of Kerrison rongeurs were used to remove the remaining lamina and ligamentum overlying the thecal sac. The ligamentum was calcified and adherent to the dura at L1/2 and L3/4 so a penfield and curette had to be used to separate the ligamentum off of the dura prior to using the kerrison to remove those portions of ligamentum. A woodsen was used to protect the thecal sac as a kerrison was used to remove the medial aspect of the facets bilaterally. A woodsen was then used to find the foramen and protect the exiting nerve root as a kerrison was used to remove the facet and soft tissue overlying the exiting nerve root at the L2/3 and L3/4 foramen bilaterally.   A woodsen was placed into the laminectomy site to palpate for any remaining areas of stenosis. Once it was confirmed with the woodsen that decompression had been completed from the medial pedicle wall to the contralateral medial pedicle wall and from the pedicle of L1 to the pedicle of L4, decompression was determined to be completed. Bendini was used to register the pedicle screws and estimate the countour of the needed rod. After doing this, a 5.70mm titanium rod was fashioned on the back table. The rod was placed into the pedicle screws and set screws were tightened over the rod into each pedicle screw. The same process was repeated for the contralateral side. All the set screws were final tightened. Final AP and lateral fluoroscopic images showed satisfactory position of the instrumentation and the laminectomy defect from L1-4. 1L of sterile saline was used to irrigate the wound.   A high speed burr was used to decorticate the facet joints, pars interarticularis, and the transverse processes at the planned arthrodesis levels. The locally harvested and morselized autograft was mixed with demineralized bone matrix and placed over the decorticated bone. All bony fragments that inadvertently went into  the laminectomy defect were removed. The decompressed area was again palpated with a woodsen which confirmed satisfactory decompression.   1g of vancomycin powder was placed into the wound. A medium hemovac drain was placed deep to the fascia. The fascia was reapproximated with 0 vicryl suture. Neuromonitoring was checked and there were no changes from baseline. Neuromonitoring was discontinued at this time. A subcutaneous hemovac drain was placed above the fascia. The subcutaneous fat was reapproximated with 0 vicryl suture. The deep dermal layer was reapproximated  with 2-0 viryl. The skin as closed with a 3-0 running moncryl. All counts were correct at the end of the case. The incision was dressed with steri strips and benzoine. An island dressing was placed over the wound. The patient was transferred back to a bed and brought to the post-anesthesia care unit by anesthesia staff in stable condition.  Post-operative plan: The patient was slow to wake up in the operating room so plan was made for ICU overnight. She will then transfer to the floor after extubation in the morning. The patient will receive two post-operative doses of ancef. She did receive another dose of TXA intra-operatively and will get one post-operatively as well. The patient will be out of bed as tolerated with no brace. The patient will work with physical therapy. The drains will be removed prior to discharge when the output slows. She will be admitted for several days after surgery and may need SNF/rehab depending on how she does on the floor post-operatively.   Willia Craze, MD Orthopedic Surgeon

## 2022-07-30 NOTE — Procedures (Signed)
Extubation Procedure Note  Patient Details:   Name: MONTIE ABBASI DOB: 04/02/1945 MRN: [id number]   Airway Documentation:  Cuff leak test with minimal leak, MD notified and aware.     Vent end date: 07/30/22 Vent end time: 0815   Evaluation  O2 sats: stable throughout Complications: No apparent complications Patient did tolerate procedure well. Bilateral Breath Sounds: Diminished, Clear, Other (Comment) (coarse)   Yes pt able to speak, no distress noted, no stridor noted. Pt denies SOB.  Jennette Kettle 07/30/2022, 8:18 AM

## 2022-07-31 LAB — BASIC METABOLIC PANEL
Anion gap: 7 (ref 5–15)
BUN: 17 mg/dL (ref 8–23)
CO2: 24 mmol/L (ref 22–32)
Calcium: 7.5 mg/dL — ABNORMAL LOW (ref 8.9–10.3)
Chloride: 104 mmol/L (ref 98–111)
Creatinine, Ser: 0.92 mg/dL (ref 0.44–1.00)
GFR, Estimated: >60 mL/min (ref >60)
Glucose, Bld: 132 mg/dL — ABNORMAL HIGH (ref 70–99)
Potassium: 3.7 mmol/L (ref 3.5–5.1)
Sodium: 135 mmol/L (ref 135–145)

## 2022-07-31 LAB — CBC
HCT: 25.2 % — ABNORMAL LOW (ref 36.0–46.0)
Hemoglobin: 8.1 g/dL — ABNORMAL LOW (ref 12.0–15.0)
MCH: 26.4 pg (ref 26.0–34.0)
MCHC: 32.1 g/dL (ref 30.0–36.0)
MCV: 82.1 fL (ref 80.0–100.0)
Platelets: 125 10*3/uL — ABNORMAL LOW (ref 150–400)
RBC: 3.07 MIL/uL — ABNORMAL LOW (ref 3.87–5.11)
RDW: 16.6 % — ABNORMAL HIGH (ref 11.5–15.5)
WBC: 14.6 10*3/uL — ABNORMAL HIGH (ref 4.0–10.5)
nRBC: 0 % (ref 0.0–0.2)

## 2022-07-31 LAB — GLUCOSE, CAPILLARY
Glucose-Capillary: 121 mg/dL — ABNORMAL HIGH (ref 70–99)
Glucose-Capillary: 123 mg/dL — ABNORMAL HIGH (ref 70–99)
Glucose-Capillary: 136 mg/dL — ABNORMAL HIGH (ref 70–99)
Glucose-Capillary: 153 mg/dL — ABNORMAL HIGH (ref 70–99)
Glucose-Capillary: 188 mg/dL — ABNORMAL HIGH (ref 70–99)
Glucose-Capillary: 58 mg/dL — ABNORMAL LOW (ref 70–99)
Glucose-Capillary: 90 mg/dL (ref 70–99)
Glucose-Capillary: 93 mg/dL (ref 70–99)

## 2022-07-31 MED ORDER — AMLODIPINE BESYLATE 10 MG PO TABS: 10.0000 mg | ORAL_TABLET | Freq: Every day | ORAL | Status: DC

## 2022-07-31 MED ORDER — HYDROMORPHONE HCL 2 MG/ML IJ SOLN: 4.0000 mg | INTRAMUSCULAR | Status: DC | PRN

## 2022-07-31 MED ORDER — NALOXONE HCL 0.4 MG/ML IJ SOLN: 0.4000 mg | INTRAMUSCULAR | Status: DC | PRN

## 2022-07-31 MED ORDER — CALCIUM CARBONATE 1250 (500 CA) MG PO TABS
1.0000 | ORAL_TABLET | Freq: Two times a day (BID) | ORAL | Status: DC
  Administered 2022-07-31 – 2022-08-05 (×11): 1250 mg via ORAL
  Filled 2022-07-31 (×11): qty 1

## 2022-07-31 MED ORDER — SODIUM CHLORIDE 0.9% FLUSH: 9.0000 mL | INTRAVENOUS | Status: DC | PRN

## 2022-07-31 MED ORDER — HYDROMORPHONE HCL 1 MG/ML IJ SOLN: 4.0000 mg | INTRAMUSCULAR | Status: DC | PRN

## 2022-07-31 MED ORDER — HYDROMORPHONE 1 MG/ML IV SOLN
INTRAVENOUS | Status: DC
  Administered 2022-07-31: 30 mg via INTRAVENOUS
  Administered 2022-07-31: 1.2 mg via INTRAVENOUS
  Administered 2022-08-01: 0.3 mg via INTRAVENOUS
  Administered 2022-08-01: 1.5 mg via INTRAVENOUS
  Administered 2022-08-01: 0.3 mg via INTRAVENOUS
  Administered 2022-08-02: 0.6 mg via INTRAVENOUS
  Filled 2022-07-31: qty 30

## 2022-07-31 MED ORDER — SODIUM CHLORIDE 0.9 % IV SOLN
12.5000 mg | Freq: Four times a day (QID) | INTRAVENOUS | Status: DC
  Administered 2022-07-31 – 2022-08-02 (×7): 12.5 mg via INTRAVENOUS
  Filled 2022-07-31: qty 0.5
  Filled 2022-07-31 (×2): qty 12.5
  Filled 2022-07-31 (×3): qty 0.5
  Filled 2022-07-31 (×2): qty 12.5
  Filled 2022-07-31 (×2): qty 0.5

## 2022-07-31 MED ORDER — ACETAMINOPHEN 500 MG PO TABS
1000.0000 mg | ORAL_TABLET | Freq: Three times a day (TID) | ORAL | Status: DC
  Administered 2022-07-31 – 2022-08-08 (×20): 1000 mg via ORAL
  Filled 2022-07-31 (×22): qty 2

## 2022-07-31 NOTE — Progress Notes (Signed)
Orthopedic Surgery Post-operative Progress Note  Assessment: Patient is a 77 y.o. female who is currently admitted after undergoing L2/3 and L3/4 XLIF, L1-4 laminectomies, T10-pelvis PSIF   Plan: -Operative plans complete -Needs upright films when able -Drains to be maintained until output slows -Out of bed as tolerated, no brace -No bending/lifting/twisting greater than 10 pounds -PT evaluate and treat -Diabetic diet -No chemoprophylaxis for dvt or antiplatelets for 72 hours after surgery -Ancef x2 post-operative doses -Disposition: remain floor status   Pain control -PCA has been helpful, pain is under better control  Nausea  -her nausea has improved with the scheduled phenergan -she is eating dinner tonight without any nausea or emesis  Diabetes -diabetic diet -sliding scale insulin  Acute anemia from surgical blood loss -hgb was 8.1 this morning  -will check again tomorrow, if stable will stop monitoring  Hypocalcemia -Ca of 7.5 this morning -oral supplementation started  HTN -continue home amlodipine  HLD -continue home lipitor  GERD -continue home PPI  Depression -continue home cymbalta  ___________________________________________________________________________   Subjective: No acute events since she was seen this morning. Pain is under better control since starting the PCA this morning. She is not having as much nausea as she was this morning. Still has some pain in her left hip with hip flexion but it is much better than this morning. Denies paresthesias and numbness.   Objective:  General: sitting up in bed, eating dinner Neurologic: alert, following commands, answering questions appropriately Respiratory: unlabored breathing on room air Skin: dressings clear/dry/intact, drains with serosanguinous output  MSK (spine):  -Strength exam      Right  Left  EHL    5/5  5/5 TA    5/5  5/5 GSC    5/5  5/5 Knee extension  5/5  5/5 Hip  flexion  5/5  4/5   -Sensory exam    Sensation intact to light touch in L3-S1 nerve distributions of bilateral lower extremities   Patient name: Wendy [name] Patient MRN: [id number] Date: 07/31/22

## 2022-07-31 NOTE — Progress Notes (Signed)
IV team consulted for PIV placement. Upon arrival to pt room IV RN noticed PIV already in place. This RN spoke to the care RN about need for second PIV. Care RN stated the PIV order was placed before her shift and she did not know why the pt needed second IV. IV nurse advised care RN to follow up with provider and clarify PIV order and to contact VAT team if PIV needs to be placed.

## 2022-07-31 NOTE — Progress Notes (Signed)
Pt was ordered PCA pump around noon. Pt does not keep CO2 and O2 on and channel states pt not breathing when pt is breathing normally (pt breathes through mouth). I spoke with the charge RN to ask for advice; was told to turn off channel and remove tubing from pt. Also, pt has fake nails and blue nail polish on, SpO2 is unable to read oxygen. Placed sensor on toe which still has a difficult time reading. Pt urinated several times this morning but not urinating well this afternoon. Pt has requested to get out of bed but don't feel pt is safe enough to stand. Gave bed pan with no success. Padded pt and told pt to urinate in bed and we will change her immediately (pt was urinating in bed this morning). Pt moves a lot in bed. Pt is AO4.

## 2022-07-31 NOTE — Progress Notes (Addendum)
Orthopedic Surgery Post-operative Progress Note  Assessment: Patient is a 77 y.o. female who is currently admitted after undergoing L2/3 and L3/4 XLIF, L1-4 laminectomies, T10-pelvis PSIF   Plan: -Operative plans complete -Needs upright films when able -Drains to be maintained until output slows -Out of bed as tolerated, no brace -No bending/lifting/twisting greater than 10 pounds -PT evaluate and treat -Diabetic diet -No chemoprophylaxis for dvt or antiplatelets for 72 hours after surgery -Ancef x2 post-operative doses -Disposition: remain floor status   Pain control -has been having a lot pain but has not been asking for her PRN medication -switched her to a PCA this morning  Nausea  -ha shad nausea post-operatively but also has not been asking for her PRN, so switched her to scheduled phenergan  Diabetes -diabetic diet -sliding scale insulin  Acute anemia from surgical blood loss -hgb 8.1 this morning  -will check again tomorrow, if stable will stop monitoring  Hypocalcemia -Ca of 7.5 this morning -oral supplementation started  HTN -continue home amlodipine  HLD -continue home lipitor  GERD -continue home PPI  Depression -continue home cymbalta  ___________________________________________________________________________   Subjective: Was extubated yesterday morning and transferred to the floor in the afternoon. Was able to walk around the room yesterday with PT. No acute events overnight. Has had a lot of pain in her back. Reporting left thigh pain with hip flexion this morning. No pain radiating past the thigh in the left leg. No pain radiating into the right lower extremity. Most of her pain is in her back. Has had a lot of nausea since surgery. No emesis. She has not noticed improvement with zofran.   Objective:  General: laying on her side in bed, NAD Neurologic: alert, following commands, answering questions appropriately Respiratory: unlabored  breathing on room air Skin: dressings clear/dry/intact, drains with serosanguinous output  MSK (spine):  -Strength exam      Right  Left  EHL    5/5  5/5 TA    5/5  5/5 GSC    5/5  5/5 Knee extension  5/5  4/5 Hip flexion  5/5  3/5  Had pain with hip flexion testing in her left thigh that was limiting her  -Sensory exam    Sensation intact to light touch in L3-S1 nerve distributions of bilateral lower extremities   Patient name: Wendy [name] Patient MRN: [id number] Date: 07/31/22

## 2022-07-31 NOTE — Progress Notes (Signed)
Dr. Christell Constant notified of  low blood sugar 58 this am; Oranje juice, and gram crackers was given, rechecked blood sugar went up to 90. Patient had barely urine output 50 ml on the purewick, got her up and sat on the commode, with minimal urine output. Surgical incision(  midback) with dressing dry and intact, bilateral hemo vac, rt side with 200 output and Lt side 20 output, still bloody. Ongoing nausea, prn phenergan was given.

## 2022-08-01 LAB — GLUCOSE, CAPILLARY
Glucose-Capillary: 116 mg/dL — ABNORMAL HIGH (ref 70–99)
Glucose-Capillary: 120 mg/dL — ABNORMAL HIGH (ref 70–99)
Glucose-Capillary: 155 mg/dL — ABNORMAL HIGH (ref 70–99)
Glucose-Capillary: 162 mg/dL — ABNORMAL HIGH (ref 70–99)
Glucose-Capillary: 189 mg/dL — ABNORMAL HIGH (ref 70–99)
Glucose-Capillary: 231 mg/dL — ABNORMAL HIGH (ref 70–99)

## 2022-08-01 LAB — BASIC METABOLIC PANEL
Anion gap: 9 (ref 5–15)
BUN: 25 mg/dL — ABNORMAL HIGH (ref 8–23)
CO2: 23 mmol/L (ref 22–32)
Calcium: 7.7 mg/dL — ABNORMAL LOW (ref 8.9–10.3)
Chloride: 103 mmol/L (ref 98–111)
Creatinine, Ser: 1.25 mg/dL — ABNORMAL HIGH (ref 0.44–1.00)
GFR, Estimated: 45 mL/min — ABNORMAL LOW (ref >60)
Glucose, Bld: 133 mg/dL — ABNORMAL HIGH (ref 70–99)
Potassium: 3.5 mmol/L (ref 3.5–5.1)
Sodium: 135 mmol/L (ref 135–145)

## 2022-08-01 LAB — CBC
HCT: 23.5 % — ABNORMAL LOW (ref 36.0–46.0)
Hemoglobin: 7.6 g/dL — ABNORMAL LOW (ref 12.0–15.0)
MCH: 26.8 pg (ref 26.0–34.0)
MCHC: 32.3 g/dL (ref 30.0–36.0)
MCV: 82.7 fL (ref 80.0–100.0)
Platelets: 128 10*3/uL — ABNORMAL LOW (ref 150–400)
RBC: 2.84 MIL/uL — ABNORMAL LOW (ref 3.87–5.11)
RDW: 17 % — ABNORMAL HIGH (ref 11.5–15.5)
WBC: 15.3 10*3/uL — ABNORMAL HIGH (ref 4.0–10.5)
nRBC: 0.3 % — ABNORMAL HIGH (ref 0.0–0.2)

## 2022-08-01 MED ORDER — AMLODIPINE BESYLATE 10 MG PO TABS: 10.0000 mg | ORAL_TABLET | Freq: Every day | ORAL | Status: DC

## 2022-08-01 MED ORDER — RIVAROXABAN 10 MG PO TABS
10.0000 mg | ORAL_TABLET | Freq: Every day | ORAL | Status: DC
  Administered 2022-08-02 – 2022-08-08 (×7): 10 mg via ORAL
  Filled 2022-08-01 (×7): qty 1

## 2022-08-01 NOTE — Care Management Important Message (Signed)
Important Message  Patient Details  Name: Wendy [name] MRN: [id number] Date of Birth: May 25, 1945   Medicare Important Message Given:  Yes     Sherilyn Banker 08/01/2022, 3:19 PM

## 2022-08-01 NOTE — Progress Notes (Addendum)
Orthopedic Surgery Post-operative Progress Note  Assessment: Patient is a 77 y.o. female who is currently admitted after undergoing L2/3 and L3/4 XLIF, L1-4 laminectomies, T10-pelvis PSIF   Plan: -Operative plans complete -Needs upright films when able -Plan to pull drains tomorrow -Out of bed as tolerated, no brace -No bending/lifting/twisting greater than 10 pounds -PT evaluate and treat -Diabetic diet -Will start xarelto tomorrow for dvt ppx -Disposition: remain floor status   Pain control -continue PCA for now -may discontinue tomorrow  Nausea  -her nausea has improved with the scheduled phenergan, will switch to PRN tomorrow  Diabetes -diabetic diet -sliding scale insulin  Acute anemia from surgical blood loss -hgb was 7.6 this morning  -will check again tomorrow, if stable will stop monitoring  Hypocalcemia -Ca of 7.7 this morning -continue oral supplementation  HTN -continue home amlodipine  HLD -continue home lipitor  GERD -continue home PPI  Depression -continue home cymbalta  ___________________________________________________________________________   Subjective: No acute events overnight. Pain adequately controlled with the PCA. No pain radiating into her legs. Was able to get some sleep last night. Reports decreased sensation in her left anterior thigh. No other numbness or paresthesias.   Objective:  General: sitting up in bed, eating breakfast  Neurologic: alert, following commands, answering questions appropriately Respiratory: unlabored breathing on room air Skin: dressings clear/dry/intact, drains with serosanguinous output  MSK (spine):  -Strength exam      Right  Left  EHL    5/5  5/5 TA    5/5  5/5 GSC    5/5  5/5 Knee extension  5/5  5/5 Hip flexion  5/5  3/5   -Sensory exam    Sensation intact to light touch in L3-S1 nerve distributions of bilateral lower extremities (decreased in left anterior thigh)   Patient name:  Wendy [name] Patient MRN: [id number] Date: 08/01/22

## 2022-08-01 NOTE — TOC Initial Note (Signed)
Transition of Care [facility]) - Initial/Assessment Note    Patient Details  Name: KARMAH JEANGILLES MRN: [id number] Date of Birth: June 20, 1945  Transition of Care [facility]) CM/SW Contact:    Carley Hammed, LCSW Phone Number: 08/01/2022, 11:10 AM  Clinical Narrative:                  Transition of Care [facility]) - Inpatient Brief Assessment   Patient Details  Name: KAYBRI MCROBERTS MRN: [id number] Date of Birth: [birth year]  Transition of Care [facility]) CM/SW Contact:    Carley Hammed, LCSW Phone Number: 08/01/2022, 11:15 AM   Clinical Narrative:   Transition of Care [facility]) Screening Note   Patient Details  Name: CLARAMAE MCCAY Date of Birth: [birth year]   Transition of Care [facility]) CM/SW Contact:    Carley Hammed, LCSW Phone Number: 08/01/2022, 11:15 AM    Transition of Care Department [facility]) has reviewed patient and no TOC needs have been identified at this time. Patients current recommendations are for CIR level of therapies. We will continue to monitor patient advancement through interdisciplinary progression rounds, and assess for disposition changes. If new patient transition needs arise, please place a TOC consult.    Transition of Care Asessment: Insurance and Status: Insurance coverage has been reviewed Patient has primary care physician: Yes Home environment has been reviewed: Home with friends Prior level of function:: modified independent   Social Determinants of Health Reivew: SDOH reviewed no interventions necessary Readmission risk has been reviewed: Yes Transition of care needs: no transition of care needs at this time        Patient Goals and CMS Choice            Expected Discharge Plan and Services                                              Prior Living Arrangements/Services                       Activities of Daily Living      Permission Sought/Granted                  Emotional Assessment               Admission diagnosis:  S/P lumbar fusion [Z98.1] Lumbar stenosis with neurogenic claudication [M48.062] Patient Active Problem List   Diagnosis Date Noted   Type 2 diabetes mellitus treated without insulin (HCC) 07/30/2022   Endotracheal tube present 07/30/2022   S/P lumbar fusion 07/29/2022   Lumbar stenosis with neurogenic claudication 07/29/2022   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   Type 2 diabetes mellitus with diabetic neuropathy, unspecified (HCC) 12/26/2020   FOOT PAIN 05/23/2009   LOW BACK PAIN SYNDROME 07/21/2007   DEPENDENT EDEMA 07/21/2007   HEPATITIS C 02/08/2007   GERD 11/20/2006   Diabetic neuropathy, type II diabetes mellitus (HCC) 11/05/2006   Essential hypertension 11/05/2006   Rheumatoid arthritis (HCC) 11/05/2006   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   PCP:  [name], MD Pharmacy:   [facility] [phone] [place], [place] - [address] AT [address] [place] [address] Phone: [phone] Fax: [phone]  [id number] - [place], [place] - [address] [place] [address] Phone: [phone] Fax: [phone]     Social Determinants of Health (  SDOH) Social History: SDOH Screenings   Food Insecurity: No Food Insecurity (11/29/2021)  Housing: Low Risk  (11/29/2021)  Transportation Needs: No Transportation Needs (11/29/2021)  Alcohol Screen: Low Risk  (11/29/2021)  Depression (PHQ2-9): Medium Risk (03/17/2022)  Financial Resource Strain: Low Risk  (01/16/2022)  Physical Activity: Insufficiently Active (11/29/2021)  Social Connections: Socially Integrated (11/29/2021)  Stress: No Stress Concern Present (11/29/2021)  Tobacco Use: Medium Risk (07/29/2022)   SDOH Interventions:     Readmission Risk Interventions     No data to display

## 2022-08-01 NOTE — Progress Notes (Signed)
Physical Therapy Treatment Patient Details Name: Wendy [name] MRN: [id number] DOB: August 17, 1945 Today's Date: 08/01/2022   History of Present Illness 77 y.o. female presents to [facility] hospital on 07/29/2022 with low back pain radiating into BLE. 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 Pt underwent L2-4 XLIF, L1-4 laminectomies, T10-pelvis PSIF 5/28. Pt extubated on 5/29. PMH includes HLD, HTN, GERD, depression, anxiety, chronic pain, DM.    PT Comments    Pt received in supine and agreeable to session. Pt reporting that her LLE feels "numb" at the beginning of the session. Pt requires grossly min A+2-mod A +2 for all mobility tasks due to weakness and pain. Pt able to improve power up to standing with increased time and cues for technique. Pt able to tolerate 2 standing trials from the bed and 1 from the stedy paddles for BLE strengthening. Pt was also able to remain standing for ~1 min each trial without buckling and lift each LE with min guard for safety. Pt reports that "the feeling is coming back" referring to the LLE at the end of the session. Pt continues to benefit from PT services to progress toward functional mobility goals.    Recommendations for follow up therapy are one component of a multi-disciplinary discharge planning process, led by the attending physician.  Recommendations may be updated based on patient status, additional functional criteria and insurance authorization.     Assistance Recommended at Discharge Intermittent Supervision/Assistance  Patient can return home with the following A lot of help with walking and/or transfers;A lot of help with bathing/dressing/bathroom;Assistance with cooking/housework;Assist for transportation;Help with stairs or ramp for entrance   Equipment Recommendations  BSC/3in1    Recommendations for Other Services       Precautions / Restrictions Precautions Precautions: Fall;Back Precaution Booklet Issued: Yes (comment) Restrictions Weight Bearing  Restrictions: Yes RLE Weight Bearing: Weight bearing as tolerated LLE Weight Bearing: Weight bearing as tolerated     Mobility  Bed Mobility Overal bed mobility: Needs Assistance Bed Mobility: Rolling, Sidelying to Sit Rolling: Min assist, +2 for physical assistance Sidelying to sit: Mod assist, +2 for physical assistance       General bed mobility comments: Mod A +2 for trunk elevation and BLE advancement to EOB    Transfers Overall transfer level: Needs assistance Equipment used: Ambulation equipment used Transfers: Sit to/from Stand, Bed to chair/wheelchair/BSC Sit to Stand: Mod assist, +2 physical assistance, From elevated surface           General transfer comment: From elevated EOB x2 with increased time and mod A +2 for power up. Pt able to stand from the stedy paddles with min guard. Transfer via Lift Equipment: Stedy  Ambulation/Gait             Pre-gait activities: Pt able to elevate each LE         Balance Overall balance assessment: Needs assistance Sitting-balance support: Single extremity supported, Feet supported Sitting balance-Leahy Scale: Fair Sitting balance - Comments: Initial min A progressing to min guard   Standing balance support: Bilateral upper extremity supported, Reliant on assistive device for balance Standing balance-Leahy Scale: Poor Standing balance comment: min guard once standing with stedy support                            Cognition Arousal/Alertness: Awake/alert Behavior During Therapy: Flat affect Overall Cognitive Status: Within Functional Limits for tasks assessed  Exercises      General Comments General comments (skin integrity, edema, etc.): O2 noted to be 85% on RA at the beginning of the session improving to 94% with 2L O2. Pt remained on 2L throughout session. SpO2 at 92% on RA at the end of the session sitting in the recliner.       Pertinent Vitals/Pain Pain Assessment Pain Assessment: Faces Faces Pain Scale: Hurts little more Pain Location: back Pain Descriptors / Indicators: Grimacing Pain Intervention(s): Monitored during session, Repositioned     PT Goals (current goals can now be found in the care plan section) Acute Rehab PT Goals Patient Stated Goal: to return to independence PT Goal Formulation: With patient Time For Goal Achievement: 08/13/22 Potential to Achieve Goals: Good Progress towards PT goals: Progressing toward goals    Frequency    Min 5X/week      PT Plan Current plan remains appropriate       AM-PAC PT "6 Clicks" Mobility   Outcome Measure  Help needed turning from your back to your side while in a flat bed without using bedrails?: A Little Help needed moving from lying on your back to sitting on the side of a flat bed without using bedrails?: A Lot Help needed moving to and from a bed to a chair (including a wheelchair)?: Total Help needed standing up from a chair using your arms (e.g., wheelchair or bedside chair)?: Total Help needed to walk in hospital room?: Total Help needed climbing 3-5 steps with a railing? : Total 6 Click Score: 9    End of Session Equipment Utilized During Treatment: Gait belt Activity Tolerance: Patient tolerated treatment well Patient left: with call bell/phone within reach;in chair;with family/visitor present Nurse Communication: Mobility status PT Visit Diagnosis: Other abnormalities of gait and mobility (R26.89);Muscle weakness (generalized) (M62.81);Pain Pain - part of body:  (back)     Time: 9147-8295 PT Time Calculation (min) (ACUTE ONLY): 37 min  Charges:  $Therapeutic Activity: 23-37 mins                     Johny Shock, PTA Acute Rehabilitation Services Secure Chat Preferred  [phone]    Johny Shock 08/01/2022, 12:12 PM

## 2022-08-02 ENCOUNTER — Inpatient Hospital Stay (HOSPITAL_COMMUNITY): Payer: Medicare HMO

## 2022-08-02 LAB — GLUCOSE, CAPILLARY
Glucose-Capillary: 119 mg/dL — ABNORMAL HIGH (ref 70–99)
Glucose-Capillary: 121 mg/dL — ABNORMAL HIGH (ref 70–99)
Glucose-Capillary: 177 mg/dL — ABNORMAL HIGH (ref 70–99)
Glucose-Capillary: 178 mg/dL — ABNORMAL HIGH (ref 70–99)
Glucose-Capillary: 222 mg/dL — ABNORMAL HIGH (ref 70–99)
Glucose-Capillary: 267 mg/dL — ABNORMAL HIGH (ref 70–99)
Glucose-Capillary: 91 mg/dL (ref 70–99)

## 2022-08-02 LAB — BASIC METABOLIC PANEL
Anion gap: 7 (ref 5–15)
BUN: 25 mg/dL — ABNORMAL HIGH (ref 8–23)
CO2: 22 mmol/L (ref 22–32)
Calcium: 7.5 mg/dL — ABNORMAL LOW (ref 8.9–10.3)
Chloride: 106 mmol/L (ref 98–111)
Creatinine, Ser: 1.07 mg/dL — ABNORMAL HIGH (ref 0.44–1.00)
GFR, Estimated: 54 mL/min — ABNORMAL LOW (ref >60)
Glucose, Bld: 131 mg/dL — ABNORMAL HIGH (ref 70–99)
Potassium: 3.8 mmol/L (ref 3.5–5.1)
Sodium: 135 mmol/L (ref 135–145)

## 2022-08-02 LAB — CBC
HCT: 22 % — ABNORMAL LOW (ref 36.0–46.0)
Hemoglobin: 6.8 g/dL — CL (ref 12.0–15.0)
MCH: 27 pg (ref 26.0–34.0)
MCHC: 30.9 g/dL (ref 30.0–36.0)
MCV: 87.3 fL (ref 80.0–100.0)
Platelets: 131 10*3/uL — ABNORMAL LOW (ref 150–400)
RBC: 2.52 MIL/uL — ABNORMAL LOW (ref 3.87–5.11)
RDW: 17.4 % — ABNORMAL HIGH (ref 11.5–15.5)
WBC: 10.9 10*3/uL — ABNORMAL HIGH (ref 4.0–10.5)
nRBC: 0.5 % — ABNORMAL HIGH (ref 0.0–0.2)

## 2022-08-02 LAB — BPAM RBC
Blood Product Expiration Date: 202406262359
ISSUE DATE / TIME: 202406011511
Unit Type and Rh: 7300

## 2022-08-02 LAB — TYPE AND SCREEN: Unit division: 0

## 2022-08-02 LAB — PREPARE RBC (CROSSMATCH)

## 2022-08-02 LAB — HEMOGLOBIN AND HEMATOCRIT, BLOOD
HCT: 26 % — ABNORMAL LOW (ref 36.0–46.0)
Hemoglobin: 8.4 g/dL — ABNORMAL LOW (ref 12.0–15.0)

## 2022-08-02 MED ORDER — SODIUM CHLORIDE 0.9% IV SOLUTION: Freq: Once | INTRAVENOUS | Status: AC

## 2022-08-02 MED ORDER — METHOCARBAMOL 750 MG PO TABS: 750.0000 mg | ORAL_TABLET | Freq: Three times a day (TID) | ORAL | Status: DC

## 2022-08-02 MED ORDER — OXYCODONE HCL 5 MG PO TABS
5.0000 mg | ORAL_TABLET | ORAL | Status: DC | PRN
  Administered 2022-08-02 – 2022-08-04 (×12): 10 mg via ORAL
  Filled 2022-08-02 (×12): qty 2

## 2022-08-02 MED ORDER — SENNA 8.6 MG PO TABS
1.0000 | ORAL_TABLET | Freq: Two times a day (BID) | ORAL | Status: DC
  Administered 2022-08-02 – 2022-08-05 (×7): 8.6 mg via ORAL
  Filled 2022-08-02 (×7): qty 1

## 2022-08-02 MED ORDER — METHOCARBAMOL 750 MG PO TABS
750.0000 mg | ORAL_TABLET | Freq: Four times a day (QID) | ORAL | Status: DC
  Administered 2022-08-02 – 2022-08-08 (×22): 750 mg via ORAL
  Filled 2022-08-02 (×25): qty 1

## 2022-08-02 MED ORDER — SODIUM CHLORIDE 0.9 % IV SOLN
12.5000 mg | Freq: Four times a day (QID) | INTRAVENOUS | Status: DC | PRN
  Administered 2022-08-05: 12.5 mg via INTRAVENOUS
  Filled 2022-08-02 (×3): qty 0.5

## 2022-08-02 NOTE — Progress Notes (Signed)
This nurse called the [facility] Orthopedic operator line to report critical low Hgb of 6.8. This nurse left left a message for a call back and no call back was received. Will pass on to day shift nurse and charge nurse.

## 2022-08-02 NOTE — Progress Notes (Signed)
Inpatient Rehab Admissions:  Inpatient Rehab Consult received.  I met with patient and friend Wendy [name] at the bedside for rehabilitation assessment and to discuss goals and expectations of an inpatient rehab admission.  Discussed average length of stay, insurance authorization requirement, discharge home after completion of CIR. Both acknowledged understanding. Chrissie Noa confirmed that he will be able to provide 24/7 support for pt after discharge. Will continue to follow.   Signed: Wolfgang Phoenix, MS, CCC-SLP Admissions Coordinator [phone]

## 2022-08-02 NOTE — Progress Notes (Signed)
Orthopedic Surgery Post-operative Progress Note  Assessment: Patient is a 77 y.o. female who is currently admitted after undergoing L2/3 and L3/4 XLIF, L1-4 laminectomies, T10-pelvis PSIF   Plan: -Operative plans complete -Needs upright films when able -Drains removed this morning -Out of bed as tolerated, no brace -No bending/lifting/twisting greater than 10 pounds -PT evaluate and treat -Up to chair with nursing, incentive spirometer -Diabetic diet -DVT ppx: xarelto -Disposition: remain floor status   Pain control -discontinue PCA, switched to oral medication  Nausea  -discontinued scheduled phenergan and switched to PRN  Diabetes -diabetic diet -sliding scale insulin  Acute anemia from surgical blood loss -hgb was 6.8 this morning  -patient is asymptomatic and does not want a transfusion after covering pros/cons, so will continue to monitor  Hypocalcemia -Ca of 7.5 this morning -continue oral supplementation  HTN -continue home amlodipine  HLD -continue home lipitor  GERD -continue home PPI  Depression -continue home cymbalta  ___________________________________________________________________________   Subjective: No acute events overnight. Pain has slowly been getting better. Still reporting decreased sensation in left anterior thigh. No other numbness/paresthesias.   Assisted patient to chair this morning. I was the only assistance she needed. She had the most difficulty going from seated position to standing. After that, she needed minimal assistance.   Objective:  General: sitting up in bed, hershey bars and regular sprite at bedside  Neurologic: alert, following commands, answering questions appropriately Respiratory: unlabored breathing on room air Skin: dressings clear/dry/intact, deep drain with 40mL of output over the last shift, 0mL of output out of the superficial drain  MSK (spine):  -Strength exam      Right  Left  EHL     5/5  5/5 TA    5/5  5/5 GSC    5/5  5/5 Knee extension  5/5  5/5 Hip flexion  5/5  3/5   -Sensory exam    Sensation intact to light touch in L3-S1 nerve distributions of bilateral lower extremities (decreased in left anterior thigh)   Patient name: Wendy [name] Patient MRN: [id number] Date: 08/02/22

## 2022-08-03 LAB — BASIC METABOLIC PANEL
Anion gap: 5 (ref 5–15)
BUN: 16 mg/dL (ref 8–23)
CO2: 24 mmol/L (ref 22–32)
Calcium: 7.7 mg/dL — ABNORMAL LOW (ref 8.9–10.3)
Chloride: 108 mmol/L (ref 98–111)
Creatinine, Ser: 0.88 mg/dL (ref 0.44–1.00)
GFR, Estimated: >60 mL/min (ref >60)
Glucose, Bld: 145 mg/dL — ABNORMAL HIGH (ref 70–99)
Potassium: 3.8 mmol/L (ref 3.5–5.1)
Sodium: 137 mmol/L (ref 135–145)

## 2022-08-03 LAB — BPAM RBC

## 2022-08-03 LAB — CBC
HCT: 25 % — ABNORMAL LOW (ref 36.0–46.0)
Hemoglobin: 7.9 g/dL — ABNORMAL LOW (ref 12.0–15.0)
MCH: 27.1 pg (ref 26.0–34.0)
MCHC: 31.6 g/dL (ref 30.0–36.0)
MCV: 85.6 fL (ref 80.0–100.0)
Platelets: 177 10*3/uL (ref 150–400)
RBC: 2.92 MIL/uL — ABNORMAL LOW (ref 3.87–5.11)
RDW: 16.6 % — ABNORMAL HIGH (ref 11.5–15.5)
WBC: 9.3 10*3/uL (ref 4.0–10.5)
nRBC: 0.3 % — ABNORMAL HIGH (ref 0.0–0.2)

## 2022-08-03 LAB — TYPE AND SCREEN
ABO/RH(D): B POS
Antibody Screen: NEGATIVE

## 2022-08-03 LAB — GLUCOSE, CAPILLARY
Glucose-Capillary: 107 mg/dL — ABNORMAL HIGH (ref 70–99)
Glucose-Capillary: 146 mg/dL — ABNORMAL HIGH (ref 70–99)
Glucose-Capillary: 160 mg/dL — ABNORMAL HIGH (ref 70–99)
Glucose-Capillary: 161 mg/dL — ABNORMAL HIGH (ref 70–99)
Glucose-Capillary: 205 mg/dL — ABNORMAL HIGH (ref 70–99)
Glucose-Capillary: 87 mg/dL (ref 70–99)

## 2022-08-03 MED ORDER — POLYETHYLENE GLYCOL 3350 17 G PO PACK
17.0000 g | PACK | Freq: Two times a day (BID) | ORAL | Status: DC
  Administered 2022-08-05: 17 g via ORAL
  Filled 2022-08-03 (×4): qty 1

## 2022-08-03 MED ORDER — MAGNESIUM CITRATE PO SOLN
1.0000 | Freq: Two times a day (BID) | ORAL | Status: DC
  Administered 2022-08-03: 1 via ORAL
  Filled 2022-08-03: qty 296

## 2022-08-03 NOTE — Progress Notes (Addendum)
Orthopedic Surgery Post-operative Progress Note  Assessment: Patient is a 77 y.o. female who is currently admitted after undergoing L2/3 and L3/4 XLIF, L1-4 laminectomies, T10-pelvis PSIF   Plan: -Operative plans complete -Drains have been removed -Out of bed as tolerated, no brace -No bending/lifting/twisting greater than 10 pounds -PT evaluate and treat -Up to chair with nursing, incentive spirometer -Diabetic diet -Explained that her numbness and weakness with hip flexion is likely due to the lateral approach and that we will continue to monitor it -DVT ppx: xarelto -Disposition: IPR  Pain control -well controlled with oral medications only  Diabetes -diabetic diet -sliding scale insulin  Acute anemia from surgical blood loss -hgb was 7.9 this morning after transfusion yesterday, appropriate response  Hypocalcemia -Ca of 7.7 this morning -continue oral supplementation  HTN -continue home amlodipine  HLD -continue home lipitor  GERD -continue home PPI  Depression -continue home cymbalta  ___________________________________________________________________________   Subjective: No acute events overnight. Pain is well controlled with current oral regimen. Says the decreased sensation in her left thigh is improving but is still present. No other numbness/paresthesias.No pain radiating into her legs.   Patient was able to ambulate in the halls with a walker this morning with myself as the only assist  Objective:  General: sitting up in bed, eating breakfast Neurologic: alert, following commands, answering questions appropriately Respiratory: unlabored breathing on room air Skin: dressings clear/dry/intact  MSK (spine):  -Strength exam      Right  Left  EHL    5/5  5/5 TA    5/5  5/5 GSC    5/5  5/5 Knee extension  5/5  5/5 Hip flexion  5/5  3/5   -Sensory exam    Sensation intact to light touch in L3-S1 nerve distributions of bilateral lower  extremities (decreased in left anterior thigh)   Patient name: Wendy [name] Patient MRN: [id number] Date: 08/03/22

## 2022-08-03 NOTE — Progress Notes (Signed)
Brief Orthopedic Note  No acute events since seen this morning. Patient had 2 bowel movements today. Has been ambulating around the room on her own with a walker to go to the restroom. Had her walk the halls with me this evening. She was able to walk to the end of the hall and back with her walker. Pain was tolerable while she was walking.  London Sheer, MD Orthopedic Surgeon

## 2022-08-04 DIAGNOSIS — M48062 Spinal stenosis, lumbar region with neurogenic claudication: Secondary | ICD-10-CM | POA: Diagnosis not present

## 2022-08-04 DIAGNOSIS — Z981 Arthrodesis status: Secondary | ICD-10-CM | POA: Diagnosis not present

## 2022-08-04 LAB — GLUCOSE, CAPILLARY
Glucose-Capillary: 57 mg/dL — ABNORMAL LOW (ref 70–99)
Glucose-Capillary: 137 mg/dL — ABNORMAL HIGH (ref 70–99)
Glucose-Capillary: 117 mg/dL — ABNORMAL HIGH (ref 70–99)
Glucose-Capillary: 282 mg/dL — ABNORMAL HIGH (ref 70–99)
Glucose-Capillary: 180 mg/dL — ABNORMAL HIGH (ref 70–99)

## 2022-08-04 MED ORDER — OXYCODONE HCL 5 MG PO TABS
10.0000 mg | ORAL_TABLET | ORAL | Status: DC
  Administered 2022-08-04 – 2022-08-08 (×24): 10 mg via ORAL
  Filled 2022-08-04 (×24): qty 2

## 2022-08-04 MED ORDER — INSULIN ASPART 100 UNIT/ML IJ SOLN
0.0000 [IU] | Freq: Three times a day (TID) | INTRAMUSCULAR | Status: DC
  Administered 2022-08-04: 5 [IU] via SUBCUTANEOUS
  Administered 2022-08-05: 1 [IU] via SUBCUTANEOUS
  Administered 2022-08-05 – 2022-08-06 (×3): 2 [IU] via SUBCUTANEOUS
  Administered 2022-08-06: 1 [IU] via SUBCUTANEOUS

## 2022-08-04 NOTE — Progress Notes (Signed)
Occupational Therapy Treatment Patient Details Name: Wendy [name] MRN: [id number] DOB: [birth year] Today's Date: 08/04/2022   History of present illness 77 y.o. female presents to [facility] hospital on 07/29/2022 with low back pain radiating into BLE. 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 Pt underwent L2-4 XLIF, L1-4 laminectomies, T10-pelvis PSIF 5/28. Pt extubated on 5/29. PMH includes HLD, HTN, GERD, depression, anxiety, chronic pain, DM.   OT comments  Pt making very limited progress towards OT goals at this time. Rating pain as 10/10 despite pre-medication and education. Pt declined OOB transfer to [facility] for urination, instead min A for rolling at bed level for bed pan. Pt able to recall 1/3 back precautions, re-educated. Pt then max A for peri care at bed level and she handed washcloth to granddaughter who was present, who did perform peri care. OOB offered again and Pt declined, stating that she has been OOB several times already today. Pt insistent that she continues to desire post-acute rehab to maximize safety and independence in ADL and functional transfers.    Recommendations for follow up therapy are one component of a multi-disciplinary discharge planning process, led by the attending physician.  Recommendations may be updated based on patient status, additional functional criteria and insurance authorization.    Assistance Recommended at Discharge Intermittent Supervision/Assistance  Patient can return home with the following  A lot of help with walking and/or transfers;A lot of help with bathing/dressing/bathroom;Assistance with cooking/housework;Help with stairs or ramp for entrance;Direct supervision/assist for financial management;Direct supervision/assist for medications management;Assist for transportation   Equipment Recommendations  Other (comment) (defer to next venue of care)    Recommendations for Other Services Rehab consult    Precautions / Restrictions Precautions Precautions:  Fall;Back Precaution Comments: able to recall 1/3 back precautions, re-educated Restrictions Weight Bearing Restrictions: Yes RLE Weight Bearing: Weight bearing as tolerated LLE Weight Bearing: Weight bearing as tolerated       Mobility Bed Mobility Overal bed mobility: Needs Assistance Bed Mobility: Rolling Rolling: Min assist         General bed mobility comments: use of rail to assist with log roll for bed pan placement    Transfers                   General transfer comment: declined OOB this session due to pain     Balance               Standing balance comment: NT this session                           ADL either performed or assessed with clinical judgement   ADL Overall ADL's : Needs assistance/impaired                         Toilet Transfer: Minimal assistance (bed level rolling)   Toileting- Clothing Manipulation and Hygiene: Maximal assistance Toileting - Clothing Manipulation Details (indicate cue type and reason): Pt provided with warm wash cloth and she had her grandaughter perform peri care at bed level       General ADL Comments: Pt stating that pain had caught up too much with her and that she was unable to perform OOB at the moment    Extremity/Trunk Assessment              Vision       Perception     Praxis      Cognition  Arousal/Alertness: Awake/alert Behavior During Therapy: WFL for tasks assessed/performed Overall Cognitive Status: Impaired/Different from baseline Area of Impairment: Awareness, Problem solving, Memory                     Memory: Decreased short-term memory, Decreased recall of precautions     Awareness: Emergent Problem Solving: Requires verbal cues, Decreased initiation General Comments: recall 1/3 back precautions, poor implementation, requires cues. self-limiting  to a certain extent        Exercises      Shoulder Instructions       General Comments       Pertinent Vitals/ Pain       Pain Assessment Pain Assessment: 0-10 Pain Score: 10-Worst pain ever Pain Location: back Pain Descriptors / Indicators: Constant, Grimacing, Guarding, Numbness Pain Intervention(s): Monitored during session, Repositioned, Premedicated before session  Home Living                                          Prior Functioning/Environment              Frequency  Min 2X/week        Progress Toward Goals  OT Goals(current goals can now be found in the care plan section)  Progress towards OT goals: Progressing toward goals (limited by pain)  Acute Rehab OT Goals Patient Stated Goal: get to rehab OT Goal Formulation: With patient/family Time For Goal Achievement: 08/13/22 Potential to Achieve Goals: Good  Plan Discharge plan remains appropriate    Co-evaluation                 AM-PAC OT "6 Clicks" Daily Activity     Outcome Measure   Help from another person eating meals?: None Help from another person taking care of personal grooming?: A Little Help from another person toileting, which includes using toliet, bedpan, or urinal?: A Lot Help from another person bathing (including washing, rinsing, drying)?: A Lot Help from another person to put on and taking off regular upper body clothing?: A Little Help from another person to put on and taking off regular lower body clothing?: A Lot 6 Click Score: 16    End of Session Equipment Utilized During Treatment: Oxygen (2L)  OT Visit Diagnosis: Unsteadiness on feet (R26.81);Muscle weakness (generalized) (M62.81);Other abnormalities of gait and mobility (R26.89);Other symptoms and signs involving cognitive function   Activity Tolerance Patient limited by pain (limited to bed level this session)   Patient Left in bed;with call bell/phone within reach   Nurse Communication Mobility status        Time: 7829-5621 OT Time Calculation (min): 21 min  Charges: OT  General Charges $OT Visit: 1 Visit OT Treatments $Self Care/Home Management : 8-22 mins  Nyoka Cowden OTR/L Acute Rehabilitation Services Office: [phone]  Evern Bio [facility] 08/04/2022, 1:18 PM

## 2022-08-04 NOTE — Progress Notes (Signed)
Orthopedic Surgery Post-operative Progress Note  Assessment: Patient is a 77 y.o. female who is currently admitted after undergoing L2/3 and L3/4 XLIF, L1-4 laminectomies, T10-pelvis PSIF   Plan: -Operative plans complete -Out of bed as tolerated, no brace -No bending/lifting/twisting greater than 10 pounds -PT evaluate and treat -Nursing to ambulate patient BID -Diabetic diet -Pain control -DVT ppx: xarelto -Disposition: pending PT reevaluation  Diabetes -diabetic diet -sliding scale insulin  Hypocalcemia -continue oral supplementation  HTN -continue home amlodipine  HLD -continue home lipitor  GERD -continue home PPI  Depression -continue home cymbalta  ___________________________________________________________________________   Subjective: No acute events overnight. Was sleeping comfortably this morning. Pain is well controlled this afternoon. Numbness in her left thigh has improved but not resolved. No pain radiating into either leg. Has been up to the restroom on her own with the walker several times today.   Objective:  General: laying in bed, NAD Neurologic: alert, following commands, answering questions appropriately Respiratory: unlabored breathing on room air Skin: dressings clear/dry/intact  MSK (spine):  -Strength exam      Right  Left  EHL    5/5  5/5 TA    5/5  5/5 GSC    5/5  5/5 Knee extension  5/5  5/5 Hip flexion  5/5  4/5   -Sensory exam    Sensation intact to light touch in L3-S1 nerve distributions of bilateral lower extremities (decreased in left anterior thigh)   Patient name: Wendy [name] Patient MRN: [id number] Date: 08/04/22

## 2022-08-04 NOTE — Discharge Instructions (Addendum)
Orthopedic Surgery Discharge Instructions  Patient name: Wendy [name] Procedure Performed: L2/3 and L3/4 lateral interbody fusion, L1-4 laminectomies, T10-pelvis posterior instrumented spinal fusion Date of Surgery: 07/29/2022 Surgeon: Willia Craze, MD  Pre-operative Diagnosis: lumbar stenosis with neurogenic claudication, sagittal imbalance Post-operative Diagnosis: same as above  Discharge Date: 08/08/2022 Discharged to: SNF Discharge Condition: stable  Activity: You should refrain from bending, lifting, or twisting with objects greater than ten pounds until three months after surgery. You are encouraged to walk as much as desired. You can perform household activities such as cleaning dishes, doing laundry, vacuuming, etc. as long as the ten-pound restriction is followed. You do not need to wear a brace during the post-operative period.   Incision Care: You do not need to wear a dressing over your incisions. There are pieces of tape over the incision. You should leave these pieces of tape in place. They will fall off with time. Do not pick, rub, or scrub at them. Do not put cream or lotion over the surgical areas. It is okay to let soap and water run over your incision. Again, do not pick, scrub, or rub at the pieces of tape when bathing. Do not submerge (e.g., take a bath, swim, go in a hot tub, etc.) until six weeks after surgery.   Medications: You have been prescribed oxycodone. This is a narcotic pain medication and should only be taken as prescribed. You should not drink alcohol or operate heavy machinery (including driving) while taking this medication. The oxycodone can cause constipation as a side effect. For that reason, you have been prescribed miralax to be used as needed for constipation. Should you remain constipated even while taking the miralax, please increase the dose of miralax to twice daily. Tylenol has been prescribed to be taken every 8 hours, which will give you  additional pain relief. Robaxin is a muscle relaxer that has been prescribed to you for muscle spasm type pain. Take this medication as needed.   Do not take NSAIDs (ibuprofen, Aleve, Celebrex, naproxen, meloxicam, etc.) for the first 6 weeks after surgery as there is some evidence that their use may decrease the chances of successful fusion.   In order to set expectations for opioid prescriptions, you will only be prescribed opioids for a total of six weeks after surgery and, at two-weeks after surgery, your opioid prescription will start to tapered (decreased dosage and number of pills). If you have ongoing need for opioid medication six weeks after surgery, you will be referred to pain management. If you are already established with a provider that is giving you opioid medications, you should schedule an appointment with them for six weeks after surgery if you feel you are going to need another prescription. State law only allows for opioid prescriptions one week at a time. If you are running out of opioid medication near the end of the week, please call the office during business hours before running out so I can send you another prescription.   You have been prescribed a blood thinner called xarelto. You should refrain from using other blood thinners (aspirin, warfarin, apixaban, plavix, xarelto, etc.) while using the xarelto. You take 10mg  of this medication daily. When you return home from the rehab facility, you can discontinue your use of this medication.  Driving: You should not drive while taking narcotic pain medications. You should start getting back to driving slowly and you may want to try driving in a parking lot before doing anything more.  Diet: You are safe to resume your regular diet after surgery.   Reasons to Call the Office After Surgery: You should feel free to call the office with any concerns or questions you have in the post-operative period, but you should definitely notify  the office if you develop: -shortness of breath, chest pain, or trouble breathing -excessive bleeding, drainage, redness, or swelling around the surgical site -fevers, chills, or pain that is getting worse with each passing day -persistent nausea or vomiting -new weakness in either leg -new or worsening numbness or tingling in either leg -numbness in the groin, bowel or bladder incontinence -other concerns about your surgery  Follow Up Appointments: You have a follow up appointment with Dr. Christell Constant on 7/08 at 3:15pm.  Office Information:  -Willia Craze, MD -Phone number: [phone] -Address: [address]       [place], [place] [address]

## 2022-08-04 NOTE — Progress Notes (Signed)
PT Cancellation Note  Patient Details Name: Wendy [name] MRN: [id number] DOB: Jun 21, 1945   Cancelled Treatment:    Reason Eval/Treat Not Completed: (P) Pain limiting ability to participate (Pt reporting 10/10 pain and declining mobility at this time. Pt requesting PT return tomorrow.)   Johny Shock 08/04/2022, 3:59 PM
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
## 2022-08-04 NOTE — Progress Notes (Signed)
Inpatient Rehab Admissions Coordinator:  Saw pt and granddaughter at bedside. Await medical readiness. Also await updated therapy notes. Will continue to follow.  Wolfgang Phoenix, MS, CCC-SLP Admissions Coordinator [phone]

## 2022-08-04 NOTE — Discharge Summary (Addendum)
Orthopedic Surgery Discharge Summary  Patient name: Wendy [name] Patient MRN: [id number] Admit today: 07/29/2022 Discharge date: 08/08/2022  Attending physician: Willia Craze, MD Final diagnosis: lumbar stenosis with neurogenic claudication, sagittal imbalance Findings: L2/3 and L3/4 hypertrophic facets, hypertrophied ligamentum at L1/2, L2/3, L3/4, calcification of the ligamentum with adhesion to the dura at L1/2 and L3/4, solid fusion from L4-S1   Hospital course: Patient is a 77 y.o. female who was admitted after undergoing L2/3 and L3/4 XLIF, L1-4 laminectomies, T10-pelvis PSIF. The patient was not able to be extubated after surgery so she went to the ICU post-operatively. She was extubated the next day and then transferred to the floor. The patient had significant pain immediately after surgery that required IV narcotics including a PCA. She also had significant nausea that required scheduled phenergan. By POD#4, her pain was well controlled and her PCA was discontinued and she was switched to PRN phenergan. Her pain was well controlled at this time with only PO medication, including oxycodone. Labs during the hospitalization revealed hypocalcemia for which oral supplementation was started. She was anemic post-operatively from surgical blood loss. Her hemoglobin downtrended to 6.8 on POD#4. She received a transfusion of 1 unit pRBCs on POD#4. She responded appropriately with a hemoglobin of 7.9. The patient worked with physical therapy who recommended discharge to SNF. The patient was tolerating an oral diet without issue and was voiding spontaneously after surgery. The patient's vitals were stable on the day of discharge. The patient's drains were removed on POD#4. The patient was medically ready for discharge and was discharge to SNF on post-operative day 10.  Instructions:   Orthopedic Surgery Discharge Instructions  Patient name: Wendy [name] Procedure Performed: L2/3 and L3/4 lateral  interbody fusion, L1-4 laminectomies, T10-pelvis posterior instrumented spinal fusion Date of Surgery: 07/29/2022 Surgeon: Willia Craze, MD  Pre-operative Diagnosis: lumbar stenosis with neurogenic claudication, sagittal imbalance Post-operative Diagnosis: same as above  Discharge Date: 08/08/2022 Discharged to: SNF Discharge Condition: stable  Activity: You should refrain from bending, lifting, or twisting with objects greater than ten pounds until three months after surgery. You are encouraged to walk as much as desired. You can perform household activities such as cleaning dishes, doing laundry, vacuuming, etc. as long as the ten-pound restriction is followed. You do not need to wear a brace during the post-operative period.   Incision Care: You do not need to wear a dressing over your incisions. There are pieces of tape over the incision. You should leave these pieces of tape in place. They will fall off with time. Do not pick, rub, or scrub at them. Do not put cream or lotion over the surgical areas. It is okay to let soap and water run over your incision. Again, do not pick, scrub, or rub at the pieces of tape when bathing. Do not submerge (e.g., take a bath, swim, go in a hot tub, etc.) until six weeks after surgery.   Medications: You have been prescribed oxycodone. This is a narcotic pain medication and should only be taken as prescribed. You should not drink alcohol or operate heavy machinery (including driving) while taking this medication. The oxycodone can cause constipation as a side effect. For that reason, you have been prescribed miralax to be used as needed for constipation. Should you remain constipated even while taking the miralax, please increase the dose of miralax to twice daily. Tylenol has been prescribed to be taken every 8 hours, which will give you additional pain relief. Robaxin is  a muscle relaxer that has been prescribed to you for muscle spasm type pain. Take this  medication as needed.   Do not take NSAIDs (ibuprofen, Aleve, Celebrex, naproxen, meloxicam, etc.) for the first 6 weeks after surgery as there is some evidence that their use may decrease the chances of successful fusion.   In order to set expectations for opioid prescriptions, you will only be prescribed opioids for a total of six weeks after surgery and, at two-weeks after surgery, your opioid prescription will start to tapered (decreased dosage and number of pills). If you have ongoing need for opioid medication six weeks after surgery, you will be referred to pain management. If you are already established with a provider that is giving you opioid medications, you should schedule an appointment with them for six weeks after surgery if you feel you are going to need another prescription. State law only allows for opioid prescriptions one week at a time. If you are running out of opioid medication near the end of the week, please call the office during business hours before running out so I can send you another prescription.   You have been prescribed a blood thinner called xarelto. You should refrain from using other blood thinners (aspirin, warfarin, apixaban, plavix, xarelto, etc.) while using the xarelto. You take 10mg  of this medication daily. When you return home from the rehab facility, you can discontinue your use of this medication.  Driving: You should not drive while taking narcotic pain medications. You should start getting back to driving slowly and you may want to try driving in a parking lot before doing anything more.   Diet: You are safe to resume your regular diet after surgery.   Reasons to Call the Office After Surgery: You should feel free to call the office with any concerns or questions you have in the post-operative period, but you should definitely notify the office if you develop: -shortness of breath, chest pain, or trouble breathing -excessive bleeding, drainage, redness,  or swelling around the surgical site -fevers, chills, or pain that is getting worse with each passing day -persistent nausea or vomiting -new weakness in either leg -new or worsening numbness or tingling in either leg -numbness in the groin, bowel or bladder incontinence -other concerns about your surgery  Follow Up Appointments: You have a follow up appointment with Dr. Christell Constant on 7/08 at 3:15pm.  Office Information:  -Willia Craze, MD -Phone number: [phone] -Address: [address]       [place], [place] [address]      Medication Instructions  START taking these medications  START taking these medications  Morning Noon Evening Bedtime As Needed  acetaminophen 500 MG tablet Commonly known as: TYLENOL Take 2 tablets (1,000 mg total) by mouth every 8 (eight) hours for 21 days.         oxyCODONE 5 MG immediate release tablet Commonly known as: Roxicodone Take 1-2 tablets (5-10 mg total) by mouth every 4 (four) hours as needed for up to 7 days for severe pain.         rivaroxaban 10 MG Tabs tablet Commonly known as: XARELTO Take 1 tablet (10 mg total) by mouth daily. Start taking on: August 09, 2022         senna 8.6 MG Tabs tablet Commonly known as: SENOKOT Take 1 tablet (8.6 mg total) by mouth 2 (two) times daily.  CONTINUE taking these medications  CONTINUE taking these medications  Morning Noon Evening Bedtime As Needed  Accu-Chek Guide test strip Generic drug: glucose blood USE TO TEST ONCE DAILY         Accu-Chek Guide w/Device Kit Use to test sugars daily.         * accu-chek soft touch lancets Test once per day and diagnosis code is E 11.9 and dispense for Aviva plus         * Accu-Chek Softclix Lancets lancets Test once per day and diagnosis code is E 11.9 and dispense for (ACCU-CHEK Soft Touch)         ALPRAZolam 1 MG tablet Commonly known as: XANAX TAKE 1 TABLET (1 MG TOTAL) BY MOUTH AT BEDTIME AS NEEDED FOR ANXIETY OR  SLEEP. Doctor's comments: This request is for a new prescription for a controlled substance as required by Federal/State law.HELLO THIS RX HAS EXPIRED A NEW RX IS NEEDED PLEASE.         amLODipine 10 MG tablet Commonly known as: NORVASC TAKE 1 TABLET BY MOUTH EVERY DAY         atorvastatin 10 MG tablet Commonly known as: LIPITOR Take 1 tablet (10 mg total) by mouth daily.         b complex vitamins tablet Take 1 tablet by mouth daily.         Blood Pressure Monitor Kit Use to check blood pressure.         furosemide 20 MG tablet Commonly known as: LASIX Take 1 tablet (20 mg total) by mouth daily as needed (fluid retention).         glipiZIDE 10 MG tablet Commonly known as: GLUCOTROL Take 0.5 tablets (5 mg total) by mouth 2 (two) times daily before a meal.         hydroxychloroquine 200 MG tablet Commonly known as: PLAQUENIL Take 1 tablet (200 mg total) by mouth daily.         levocetirizine 5 MG tablet Commonly known as: XYZAL Take 1 tablet (5 mg total) by mouth every evening.         metFORMIN 500 MG tablet Commonly known as: GLUCOPHAGE Take 1 tablet (500 mg total) by mouth 2 (two) times daily with a meal.         omeprazole 40 MG capsule Commonly known as: PRILOSEC TAKE 1 CAPSULE BY MOUTH EVERY DAY         ondansetron 4 MG disintegrating tablet Commonly known as: ZOFRAN-ODT TAKE 1 TABLET BY MOUTH EVERY 8 HOURS AS NEEDED FOR NAUSEA AND VOMITING         polyethylene glycol 17 g packet Commonly known as: MIRALAX / GLYCOLAX Take 17 g by mouth daily as needed for mild constipation.         potassium chloride 10 MEQ tablet Commonly known as: KLOR-CON Take 1 tablet (10 mEq total) by mouth daily.         pregabalin 300 MG capsule Commonly known as: LYRICA Take 1 capsule (300 mg total) by mouth at bedtime.         Restasis 0.05 % ophthalmic emulsion Generic drug: cycloSPORINE Place 1 drop into both eyes 2 (two) times daily.         Vitamin D3 20 MCG (800 UNIT)  Tabs Take 800 Units by mouth daily.                   STOP taking these medications  STOP taking these  medications HYDROcodone-acetaminophen 10-325 MG tablet Commonly known as: NORCO    naproxen sodium 220 MG tablet Commonly known as: ALEVE

## 2022-08-04 NOTE — Progress Notes (Signed)
Brief Orthopedic Note  Patient sleeping comfortably this morning. She did not sleep well at the beginning of her hospital stay so I did not wake her. Will return around noon to round on her. Plan for today: PT evaluate and treat, nursing to ambulate patient in halls, to IPR when able.  London Sheer, MD Orthopedic Surgeon

## 2022-08-04 NOTE — Inpatient Diabetes Management (Signed)
Inpatient Diabetes Program Recommendations  AACE/ADA: New Consensus Statement on Inpatient Glycemic Control (2015)  Target Ranges:  Prepandial:   less than 140 mg/dL      Peak postprandial:   less than 180 mg/dL (1-2 hours)      Critically ill patients:  140 - 180 mg/dL   Lab Results  Component Value Date   GLUCAP 117 (H) 08/04/2022   HGBA1C 6.4 (H) 07/18/2022    Review of Glycemic Control  Diabetes history: DM 2 Outpatient Diabetes medications: Glipizide 5 mg bid, Januvia 100 mg Daily, Metformin 500 mg bid Current orders for Inpatient glycemic control:  Levemir 8 units bid Novolog 0-15 units Q4 hours  Note: Glucose 54 last night.   Inpatient Diabetes Program Recommendations:    -   May consider decreasing Novolog Correction scale to 0-9 units tid + hs from Q4 hours currently ordered.  Thanks, Christena Deem RN, MSN, BC-ADM Inpatient Diabetes Coordinator Team Pager [phone] (8a-5p)

## 2022-08-05 LAB — GLUCOSE, CAPILLARY
Glucose-Capillary: 115 mg/dL — ABNORMAL HIGH (ref 70–99)
Glucose-Capillary: 133 mg/dL — ABNORMAL HIGH (ref 70–99)
Glucose-Capillary: 151 mg/dL — ABNORMAL HIGH (ref 70–99)
Glucose-Capillary: 185 mg/dL — ABNORMAL HIGH (ref 70–99)
Glucose-Capillary: 241 mg/dL — ABNORMAL HIGH (ref 70–99)
Glucose-Capillary: 90 mg/dL (ref 70–99)

## 2022-08-05 MED ORDER — CALCIUM CARBONATE 1250 (500 CA) MG PO TABS
1.0000 | ORAL_TABLET | Freq: Two times a day (BID) | ORAL | Status: AC
  Administered 2022-08-05 – 2022-08-06 (×3): 1250 mg via ORAL
  Filled 2022-08-05 (×3): qty 1

## 2022-08-05 MED ORDER — AMLODIPINE BESYLATE 10 MG PO TABS
10.0000 mg | ORAL_TABLET | Freq: Every day | ORAL | Status: DC
  Administered 2022-08-05 – 2022-08-08 (×4): 10 mg via ORAL
  Filled 2022-08-05 (×4): qty 1

## 2022-08-05 MED ORDER — LIP MEDEX EX OINT: TOPICAL_OINTMENT | CUTANEOUS | Status: DC | PRN

## 2022-08-05 NOTE — Progress Notes (Signed)
Physical Therapy Treatment Patient Details Name: Wendy [name] MRN: [id number] DOB: 05-Nov-1945 Today's Date: 08/05/2022   History of Present Illness 77 y.o. female presents to [facility] on 07/29/2022 with low back pain radiating into BLE. 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 Pt underwent L2-4 XLIF, L1-4 laminectomies, T10-pelvis PSIF 5/28. Pt extubated on 5/29. PMH includes HLD, HTN, GERD, depression, anxiety, chronic pain, DM.    PT Comments    Pt received in supine and agreeable to session. Pt reporting improved mobility once receiving pain medicine. Pt able to ambulate to the bathroom and back to EOB with min guard for safety. Pt requiring assist for pericare and BUE support to perform hand hygiene at the sink. Pt able to tolerate increased gait distance with rest breaks and min guard for safety. Pt unable to reach a full upright posture due to back pain. Pt requiring increased assist for balance and standing from EOB without RW support due to increased unsteadiness. Pt requiring min A to return to supine for BLE elevation to EOB and to perform logroll technique. Pt continues to be appropriate for intensive inpatient follow up therapy, >3 hours/day to optimize functional mobility and independence. Acutely, pt continues to benefit from PT services to progress toward functional mobility goals.     Recommendations for follow up therapy are one component of a multi-disciplinary discharge planning process, led by the attending physician.  Recommendations may be updated based on patient status, additional functional criteria and insurance authorization.     Assistance Recommended at Discharge Intermittent Supervision/Assistance  Patient can return home with the following A lot of help with walking and/or transfers;A lot of help with bathing/dressing/bathroom;Assistance with cooking/housework;Assist for transportation;Help with stairs or ramp for entrance   Equipment Recommendations  BSC/3in1     Recommendations for Other Services       Precautions / Restrictions Precautions Precautions: Fall;Back Precaution Booklet Issued: Yes (comment) Restrictions Weight Bearing Restrictions: Yes RLE Weight Bearing: Weight bearing as tolerated LLE Weight Bearing: Weight bearing as tolerated     Mobility  Bed Mobility Overal bed mobility: Needs Assistance Bed Mobility: Rolling, Sit to Sidelying Rolling: Min assist       Sit to sidelying: Min assist General bed mobility comments: Min A for BLE elevation to EOB and use of bedrail. Min A for logroll to maintain back precautions    Transfers Overall transfer level: Needs assistance Equipment used: Rolling walker (2 wheels) Transfers: Sit to/from Stand Sit to Stand: Min guard, Min assist           General transfer comment: From EOB and toilet with min guard for safety and cues for hand placement with pt placing hands on RW despite cues. Min A to stand from EOB with HHA without RW and pt demonstrating increased unsteadiness    Ambulation/Gait Ambulation/Gait assistance: Min guard Gait Distance (Feet): 350 Feet (+200) Assistive device: Rolling walker (2 wheels) Gait Pattern/deviations: Trunk flexed, Step-through pattern, Antalgic       General Gait Details: Pt demonstrating a slow step-through pattern with trunk flexed despite cues due to pain. Pt requiring a seated and 2 standing rest breaks due to pain and fatigue.       Balance Overall balance assessment: Needs assistance Sitting-balance support: Single extremity supported, Feet supported Sitting balance-Leahy Scale: Good Sitting balance - Comments: sitting EOB   Standing balance support: Bilateral upper extremity supported, Reliant on assistive device for balance Standing balance-Leahy Scale: Fair Standing balance comment: With RW support. Pt able to stand at  sink with elbows on counter to perform hand hygiene despite cues for back precautions.                             Cognition Arousal/Alertness: Awake/alert Behavior During Therapy: WFL for tasks assessed/performed Overall Cognitive Status: Within Functional Limits for tasks assessed                                          Exercises      General Comments        Pertinent Vitals/Pain Pain Assessment Pain Assessment: 0-10 Pain Score: 5  Pain Location: back Pain Descriptors / Indicators: Constant, Grimacing, Guarding, Numbness Pain Intervention(s): Monitored during session, Patient requesting pain meds-RN notified     PT Goals (current goals can now be found in the care plan section) Acute Rehab PT Goals Patient Stated Goal: to return to independence PT Goal Formulation: With patient Time For Goal Achievement: 08/13/22 Potential to Achieve Goals: Good Progress towards PT goals: Progressing toward goals    Frequency    Min 5X/week      PT Plan Current plan remains appropriate       AM-PAC PT "6 Clicks" Mobility   Outcome Measure  Help needed turning from your back to your side while in a flat bed without using bedrails?: A Little Help needed moving from lying on your back to sitting on the side of a flat bed without using bedrails?: A Lot Help needed moving to and from a bed to a chair (including a wheelchair)?: A Little Help needed standing up from a chair using your arms (e.g., wheelchair or bedside chair)?: A Little Help needed to walk in hospital room?: A Little Help needed climbing 3-5 steps with a railing? : A Lot 6 Click Score: 16    End of Session   Activity Tolerance: Patient tolerated treatment well Patient left: in bed;with call bell/phone within reach Nurse Communication: Mobility status PT Visit Diagnosis: Other abnormalities of gait and mobility (R26.89);Muscle weakness (generalized) (M62.81);Pain     Time: 1610-9604 PT Time Calculation (min) (ACUTE ONLY): 46 min  Charges:  $Gait Training: 23-37 mins $Therapeutic  Activity: 8-22 mins                     Johny Shock, PTA Acute Rehabilitation Services Secure Chat Preferred  [phone]    Johny Shock 08/05/2022, 3:25 PM

## 2022-08-05 NOTE — Progress Notes (Signed)
Orthopedic Surgery Post-operative Progress Note  Assessment: Patient is a 77 y.o. female who is currently admitted after undergoing L2/3 and L3/4 XLIF, L1-4 laminectomies, T10-pelvis PSIF   Plan: -Operative plans complete -Out of bed as tolerated, no brace -No bending/lifting/twisting greater than 10 pounds -PT evaluate and treat -Nursing to ambulate patient BID -Diabetic diet -Pain control -DVT ppx: xarelto -Disposition: IPR  Diabetes -diabetic diet -sliding scale insulin  Hypocalcemia -continue oral supplementation  HTN -continue home amlodipine  HLD -continue home lipitor  GERD -continue home PPI  Depression -continue home cymbalta  ___________________________________________________________________________   Subjective: No acute events overnight. Pain adequately controlled with current medications. Has been up twice this morning to the restroom. Had a bowel movement today. Not having any leg pain. Numbness in her left thigh improving. No other numbness or paresthesias.   Objective:  General: NAD, was able to do a full lap around the unit with her walker Neurologic: alert, following commands, answering questions appropriately Respiratory: unlabored breathing on room air Skin: dressings clear/dry/intact  MSK (spine):  -Strength exam      Right  Left  EHL    5/5  5/5 TA    5/5  5/5 GSC    5/5  5/5 Knee extension  5/5  5/5 Hip flexion  5/5  4/5   -Sensory exam    Sensation intact to light touch in L3-S1 nerve distributions of bilateral lower extremities (decreased in left anterior thigh)   Patient name: Wendy [name] Patient MRN: [id number] Date: 08/05/22

## 2022-08-05 NOTE — Plan of Care (Signed)
  Problem: Education: Goal: Knowledge of General Education information will improve Description: Including pain rating scale, medication(s)/side effects and non-pharmacologic comfort measures Outcome: Progressing   Problem: Pain Managment: Goal: General experience of comfort will improve Outcome: Progressing   Problem: Metabolic: Goal: Ability to maintain appropriate glucose levels will improve Outcome: Progressing

## 2022-08-05 NOTE — Progress Notes (Addendum)
   Inpatient Rehabilitation Admissions Coordinator   Met with patient at bedside for rehab assessment. We discussed goals and expectations of a possible CIR admit. She prefers CIR for rehab. Boyfriend can provide expected caregiver support that is recommended of supervision level. I will begin insurance Auth with [facility] Medicare for possible CIR admit pending approval. Please call me with any questions. Please refer to Dr Rosalyn Charters consult.  Ottie Glazier, RN, MSN Rehab Admissions Coordinator [phone]

## 2022-08-06 LAB — GLUCOSE, CAPILLARY
Glucose-Capillary: 135 mg/dL — ABNORMAL HIGH (ref 70–99)
Glucose-Capillary: 150 mg/dL — ABNORMAL HIGH (ref 70–99)
Glucose-Capillary: 184 mg/dL — ABNORMAL HIGH (ref 70–99)
Glucose-Capillary: 190 mg/dL — ABNORMAL HIGH (ref 70–99)
Glucose-Capillary: 219 mg/dL — ABNORMAL HIGH (ref 70–99)
Glucose-Capillary: 225 mg/dL — ABNORMAL HIGH (ref 70–99)
Glucose-Capillary: 250 mg/dL — ABNORMAL HIGH (ref 70–99)

## 2022-08-06 MED ORDER — POLYETHYLENE GLYCOL 3350 17 G PO PACK
17.0000 g | PACK | Freq: Every day | ORAL | Status: DC | PRN
  Administered 2022-08-06: 17 g via ORAL
  Filled 2022-08-06: qty 1

## 2022-08-06 MED ORDER — FUROSEMIDE 20 MG PO TABS
20.0000 mg | ORAL_TABLET | Freq: Every day | ORAL | Status: DC | PRN
  Administered 2022-08-06 – 2022-08-07 (×2): 20 mg via ORAL
  Filled 2022-08-06 (×2): qty 1

## 2022-08-06 NOTE — Plan of Care (Signed)

## 2022-08-06 NOTE — Progress Notes (Signed)
Inpatient Rehabilitation Admissions Coordinator   I met at bedside with patient and grand daughter. I reviewed estimated cost of care IF [facility] Medicare approved CIR. I await their determination. But, noted patient ambulating 350 feet + 200 feet with PT yesterday. Granddaughter reports ambulating patient to bathroom when her pain is controlled. Grand daughter concerned with her ability to use steps for discharge home and her pain management. I recommend therapy working on steps and for nursing to work with her pain management to work on progressing home if insurance does not approve CIR, or if CIR bed not available.  Ottie Glazier, RN, MSN Rehab Admissions Coordinator [phone] 08/06/2022 11:38 AM

## 2022-08-06 NOTE — Progress Notes (Signed)
Orthopedic Surgery Post-operative Progress Note  Assessment: Patient is a 77 y.o. female who is currently admitted after undergoing L2/3 and L3/4 XLIF, L1-4 laminectomies, T10-pelvis PSIF   Plan: -Operative plans complete -Out of bed as tolerated, no brace -No bending/lifting/twisting greater than 10 pounds -PT evaluate and treat -Nursing to ambulate patient BID -Diabetic diet -Pain control -DVT ppx: xarelto -Disposition: IPR  Diabetes -diabetic diet -sliding scale insulin  Hypocalcemia -continue oral supplementation  HTN -continue home amlodipine  HLD -continue home lipitor  GERD -continue home PPI  Depression -continue home cymbalta  ___________________________________________________________________________   Subjective: No acute events overnight. Was able to get some sleep last night. Pain is getting better. Rates pain as a 4/10 this morning. Has been ambulating around the room on her own with a walker.   Objective:  General: NAD, walked a full lap around the unit with her walker Neurologic: alert, following commands, answering questions appropriately Respiratory: unlabored breathing on room air Skin: dressings clear/dry/intact  MSK (spine):  -Strength exam      Right  Left  EHL    5/5  5/5 TA    5/5  5/5 GSC    5/5  5/5 Knee extension  5/5  5/5 Hip flexion  5/5  4/5   -Sensory exam    Sensation intact to light touch in L3-S1 nerve distributions of bilateral lower extremities (decreased in left anterior thigh)   Patient name: Wendy [name] Patient MRN: [id number] Date: 08/06/22

## 2022-08-06 NOTE — Progress Notes (Signed)
Physical Therapy Treatment Patient Details Name: Wendy [name] MRN: [id number] DOB: 11/25/1945 Today's Date: 08/06/2022   History of Present Illness 77 y.o. female presents to [facility] hospital on 07/29/2022 with low back pain radiating into BLE. 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 Pt underwent L2-4 XLIF, L1-4 laminectomies, T10-pelvis PSIF 5/28. Pt extubated on 5/29. PMH includes HLD, HTN, GERD, depression, anxiety, chronic pain, DM.    PT Comments    Pt received in supine and agreeable to session with encouragement. Pt reporting increased pain this session and demonstrates increased difficulty with mobility tasks. Pt able to complete a curb step with min guard. Pt able to tolerate shorter gait trials this session with frequent standing rest breaks due to DOE and increased pain. Pt requesting to use the bathroom x2 during the session and requires assist for pericare. Pt requires UE support when standing due to pt demonstrating slight LOB when washing hands without support briefly. Pt able to correct LOB with holding onto the counter and min A. Pt continues to benefit from PT services to progress toward functional mobility goals.     Recommendations for follow up therapy are one component of a multi-disciplinary discharge planning process, led by the attending physician.  Recommendations may be updated based on patient status, additional functional criteria and insurance authorization.     Assistance Recommended at Discharge Intermittent Supervision/Assistance  Patient can return home with the following A lot of help with walking and/or transfers;A lot of help with bathing/dressing/bathroom;Assistance with cooking/housework;Assist for transportation;Help with stairs or ramp for entrance   Equipment Recommendations  BSC/3in1    Recommendations for Other Services       Precautions / Restrictions Precautions Precautions: Fall;Back Precaution Booklet Issued: Yes (comment) Restrictions Weight Bearing  Restrictions: Yes RLE Weight Bearing: Weight bearing as tolerated LLE Weight Bearing: Weight bearing as tolerated     Mobility  Bed Mobility Overal bed mobility: Needs Assistance Bed Mobility: Rolling, Sidelying to Sit   Sidelying to sit: Mod assist       General bed mobility comments: Mod A for trunk elevation    Transfers Overall transfer level: Needs assistance Equipment used: Rolling walker (2 wheels) Transfers: Sit to/from Stand Sit to Stand: From elevated surface, Min assist           General transfer comment: Pt requesting elevated EOB due to L hip pain, however pt able to perform with min guard for safety. From low toilet with min A for power up and cues to maintain back precautions.    Ambulation/Gait Ambulation/Gait assistance: Min guard, Supervision Gait Distance (Feet): 75 Feet (+50) Assistive device: Rolling walker (2 wheels) Gait Pattern/deviations: Trunk flexed, Step-through pattern, Antalgic       General Gait Details: Pt demonstrating a slow step-through pattern and unable to improve upright posture due to pain. Pt requiring increased standing rest breaks due to DOE.   Stairs Stairs: Yes Stairs assistance: Min guard Stair Management: With walker Number of Stairs: 1 General stair comments: Pt able to perform stair trial with 1 curb step with min guard for safety and cues for sequencing.      Balance Overall balance assessment: Needs assistance Sitting-balance support: No upper extremity supported, Feet supported Sitting balance-Leahy Scale: Good Sitting balance - Comments: sitting EOB   Standing balance support: Bilateral upper extremity supported, Reliant on assistive device for balance Standing balance-Leahy Scale: Fair Standing balance comment: with RW support  Cognition Arousal/Alertness: Awake/alert Behavior During Therapy: WFL for tasks assessed/performed Overall Cognitive Status: Within  Functional Limits for tasks assessed                                          Exercises      General Comments General comments (skin integrity, edema, etc.): Pt demonstrating 3/5 DOE during gait trials requiring increased standing rest breaks. Unable to obtain SpO2 reading from pulse oximeter due to nail polish.      Pertinent Vitals/Pain Pain Assessment Pain Assessment: 0-10 Pain Score: 10-Worst pain ever Pain Location: back Pain Descriptors / Indicators: Constant, Grimacing, Guarding, Numbness Pain Intervention(s): Monitored during session, Repositioned, Limited activity within patient's tolerance     PT Goals (current goals can now be found in the care plan section) Acute Rehab PT Goals Patient Stated Goal: to return to independence PT Goal Formulation: With patient Time For Goal Achievement: 08/13/22 Potential to Achieve Goals: Good Progress towards PT goals: Progressing toward goals    Frequency    Min 5X/week      PT Plan Current plan remains appropriate       AM-PAC PT "6 Clicks" Mobility   Outcome Measure  Help needed turning from your back to your side while in a flat bed without using bedrails?: A Little Help needed moving from lying on your back to sitting on the side of a flat bed without using bedrails?: A Lot Help needed moving to and from a bed to a chair (including a wheelchair)?: A Little Help needed standing up from a chair using your arms (e.g., wheelchair or bedside chair)?: A Little Help needed to walk in hospital room?: A Little Help needed climbing 3-5 steps with a railing? : A Lot 6 Click Score: 16    End of Session   Activity Tolerance: Patient tolerated treatment well Patient left: with family/visitor present (In the bathroom with instructions to pull the cord when finished, NT notified) Nurse Communication: Mobility status PT Visit Diagnosis: Other abnormalities of gait and mobility (R26.89);Muscle weakness  (generalized) (M62.81);Pain     Time: 1440-1520 PT Time Calculation (min) (ACUTE ONLY): 40 min  Charges:  $Gait Training: 23-37 mins $Therapeutic Activity: 8-22 mins                     Johny Shock, PTA Acute Rehabilitation Services Secure Chat Preferred  [phone]    Johny Shock 08/06/2022, 3:27 PM

## 2022-08-07 LAB — GLUCOSE, CAPILLARY
Glucose-Capillary: 193 mg/dL — ABNORMAL HIGH (ref 70–99)
Glucose-Capillary: 116 mg/dL — ABNORMAL HIGH (ref 70–99)
Glucose-Capillary: 234 mg/dL — ABNORMAL HIGH (ref 70–99)
Glucose-Capillary: 233 mg/dL — ABNORMAL HIGH (ref 70–99)
Glucose-Capillary: 259 mg/dL — ABNORMAL HIGH (ref 70–99)
Glucose-Capillary: 281 mg/dL — ABNORMAL HIGH (ref 70–99)
Glucose-Capillary: 181 mg/dL — ABNORMAL HIGH (ref 70–99)

## 2022-08-07 MED ORDER — INSULIN ASPART 100 UNIT/ML IJ SOLN
0.0000 [IU] | Freq: Three times a day (TID) | INTRAMUSCULAR | Status: DC
  Administered 2022-08-08: 3 [IU] via SUBCUTANEOUS
  Administered 2022-08-08: 2 [IU] via SUBCUTANEOUS

## 2022-08-07 MED ORDER — METFORMIN HCL 500 MG PO TABS
500.0000 mg | ORAL_TABLET | Freq: Two times a day (BID) | ORAL | Status: DC
  Administered 2022-08-07 – 2022-08-08 (×2): 500 mg via ORAL
  Filled 2022-08-07 (×2): qty 1

## 2022-08-07 MED ORDER — LINAGLIPTIN 5 MG PO TABS
5.0000 mg | ORAL_TABLET | Freq: Every day | ORAL | Status: DC
  Administered 2022-08-07: 5 mg via ORAL
  Filled 2022-08-07 (×2): qty 1

## 2022-08-07 MED ORDER — GLIPIZIDE 5 MG PO TABS
5.0000 mg | ORAL_TABLET | Freq: Two times a day (BID) | ORAL | Status: DC
  Administered 2022-08-07 – 2022-08-08 (×2): 5 mg via ORAL
  Filled 2022-08-07 (×3): qty 1

## 2022-08-07 NOTE — Progress Notes (Signed)
Occupational Therapy Treatment Patient Details Name: Wendy [name] MRN: [id number] DOB: 12-28-1945 Today's Date: 08/07/2022   History of present illness 77 y.o. female presents to [facility] hospital on 07/29/2022 with low back pain radiating into BLE. 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 Pt underwent L2-4 XLIF, L1-4 laminectomies, T10-pelvis PSIF 5/28. Pt extubated on 5/29. PMH includes HLD, HTN, GERD, depression, anxiety, chronic pain, DM.   OT comments  Pt actively participated in skilled therapy, motivated to improve function to return home. Pts daughter present during session. Pt in significant pain, but able to ambulate to toilet using RW with min guard and increased time. Pt continues to require assistance for perineal hygiene and LB ADLs. Pt able to complete grooming standing at sink, min guard. Pt stated she wishes to seek post acute therapy >3 hrs/day, would benefit from post acute OT to quickly improve function and instruct on compensatory strategies prior to return home. Pt is open to other post acute options as well. Pt would greatly benefit from continued skilled therapy during acute stay.    Recommendations for follow up therapy are one component of a multi-disciplinary discharge planning process, led by the attending physician.  Recommendations may be updated based on patient status, additional functional criteria and insurance authorization.    Assistance Recommended at Discharge Intermittent Supervision/Assistance  Patient can return home with the following  A little help with walking and/or transfers;A lot of help with bathing/dressing/bathroom;Assistance with cooking/housework;Assist for transportation;Help with stairs or ramp for entrance   Equipment Recommendations  Other (comment) (defer)    Recommendations for Other Services      Precautions / Restrictions Precautions Precautions: Fall;Back Restrictions Weight Bearing Restrictions: Yes RLE Weight Bearing: Weight bearing as  tolerated LLE Weight Bearing: Weight bearing as tolerated       Mobility Bed Mobility               General bed mobility comments: arrived in chair    Transfers Overall transfer level: Needs assistance Equipment used: Rolling walker (2 wheels) Transfers: Sit to/from Stand Sit to Stand: Min assist           General transfer comment: min A to power sit to stand, min guard for step pivot transfer     Balance Overall balance assessment: Needs assistance Sitting-balance support: No upper extremity supported, Feet supported Sitting balance-Leahy Scale: Good Sitting balance - Comments: sitting in recliner   Standing balance support: Bilateral upper extremity supported, Reliant on assistive device for balance Standing balance-Leahy Scale: Fair Standing balance comment: with RW support, able to lean on sink and perform grooming                           ADL either performed or assessed with clinical judgement   ADL Overall ADL's : Needs assistance/impaired Eating/Feeding: Independent   Grooming: Min guard;Standing               Lower Body Dressing: Maximal assistance;Sit to/from stand   Toilet Transfer: Minimal assistance;Rolling walker (2 wheels)   Toileting- Clothing Manipulation and Hygiene: Maximal assistance       Functional mobility during ADLs: Min guard;Rolling walker (2 wheels) General ADL Comments: able to stand at sink performing grooming, continues to require assistance for perineal hygiene, pain with ambulation during transfers    Extremity/Trunk Assessment              Vision       Perception     Praxis  Cognition Arousal/Alertness: Awake/alert Behavior During Therapy: WFL for tasks assessed/performed Overall Cognitive Status: Within Functional Limits for tasks assessed                                          Exercises      Shoulder Instructions       General Comments      Pertinent  Vitals/ Pain       Pain Assessment Pain Assessment: 0-10 Pain Score: 10-Worst pain ever Faces Pain Scale: Hurts whole lot Pain Location: back Pain Descriptors / Indicators: Constant, Grimacing, Guarding, Numbness Pain Intervention(s): Monitored during session, Patient requesting pain meds-RN notified  Home Living                                          Prior Functioning/Environment              Frequency  Min 2X/week        Progress Toward Goals  OT Goals(current goals can now be found in the care plan section)  Progress towards OT goals: Progressing toward goals  Acute Rehab OT Goals Patient Stated Goal: to reduce pain, complete therapy, and return home OT Goal Formulation: With patient/family Time For Goal Achievement: 08/13/22 Potential to Achieve Goals: Good ADL Goals Pt Will Perform Grooming: with min guard assist;standing Pt Will Perform Upper Body Dressing: with modified independence;sitting Pt Will Perform Lower Body Dressing: with min assist;sit to/from stand Pt Will Transfer to Toilet: with min guard assist;ambulating;regular height toilet  Plan Discharge plan remains appropriate    Co-evaluation                 AM-PAC OT "6 Clicks" Daily Activity     Outcome Measure   Help from another person eating meals?: None Help from another person taking care of personal grooming?: A Little Help from another person toileting, which includes using toliet, bedpan, or urinal?: A Lot Help from another person bathing (including washing, rinsing, drying)?: A Lot Help from another person to put on and taking off regular upper body clothing?: A Little Help from another person to put on and taking off regular lower body clothing?: A Lot 6 Click Score: 16    End of Session Equipment Utilized During Treatment: Gait belt;Rolling walker (2 wheels)  OT Visit Diagnosis: Unsteadiness on feet (R26.81);Muscle weakness (generalized) (M62.81);Other  abnormalities of gait and mobility (R26.89);Other symptoms and signs involving cognitive function   Activity Tolerance Patient tolerated treatment well   Patient Left in chair;with call bell/phone within reach;with family/visitor present   Nurse Communication Mobility status        Time: 1415-1445 OT Time Calculation (min): 30 min  Charges: OT General Charges $OT Visit: 1 Visit OT Treatments $Self Care/Home Management : 23-37 mins  [place], OTR/L   Alexis Goodell 08/07/2022, 2:52 PM

## 2022-08-07 NOTE — Progress Notes (Signed)
Physical Therapy Treatment Patient Details Name: Wendy [name] MRN: [id number] DOB: [birth year] Today's Date: 08/07/2022   History of Present Illness 77 y.o. female presents to [facility] hospital on 07/29/2022 with low back pain radiating into BLE. 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 Pt underwent L2-4 XLIF, L1-4 laminectomies, T10-pelvis PSIF 5/28. Pt extubated on 5/29. PMH includes HLD, HTN, GERD, depression, anxiety, chronic pain, DM.    PT Comments    Pt was seen for short trip on hallway after discussing her unsupported sitting posture.  Assisted pt to recliner with pillow support set up for back, and discussed her needs for changing posture every hour.  Pt has a visitor in her bed and will recommend her to recline on bed when convenient to relax and protect spine.  Follow along with her for goals of PT and encourage frequent postural changes.   Recommendations for follow up therapy are one component of a multi-disciplinary discharge planning process, led by the attending physician.  Recommendations may be updated based on patient status, additional functional criteria and insurance authorization.  Follow Up Recommendations       Assistance Recommended at Discharge Intermittent Supervision/Assistance  Patient can return home with the following Assistance with cooking/housework;Assist for transportation;Help with stairs or ramp for entrance;A little help with walking and/or transfers;A little help with bathing/dressing/bathroom   Equipment Recommendations  BSC/3in1    Recommendations for Other Services       Precautions / Restrictions Precautions Precautions: Fall;Back Precaution Booklet Issued: Yes (comment) Restrictions Weight Bearing Restrictions: Yes RLE Weight Bearing: Weight bearing as tolerated LLE Weight Bearing: Weight bearing as tolerated     Mobility  Bed Mobility               General bed mobility comments: in straight back chair when PT arrives    Transfers Overall  transfer level: Needs assistance Equipment used: Rolling walker (2 wheels) Transfers: Sit to/from Stand Sit to Stand: Min assist, Mod assist           General transfer comment: up to mod to stand due to her fatigue, has a room full of company and a visitor in her bed    Ambulation/Gait Ambulation/Gait assistance: Min guard Gait Distance (Feet): 120 Feet Assistive device: Rolling walker (2 wheels) Gait Pattern/deviations: Trunk flexed, Wide base of support, Step-through pattern Gait velocity: reduced Gait velocity interpretation: <1.31 ft/sec, indicative of household ambulator Pre-gait activities: standing balance and posture ck General Gait Details: Pt is walking slowly with minimal complaints, but received pain meds before PT arrival   Stairs             Wheelchair Mobility    Modified Rankin (Stroke Patients Only)       Balance Overall balance assessment: Needs assistance Sitting-balance support: Feet supported Sitting balance-Leahy Scale: Good Sitting balance - Comments: sitting unsupported on spine in regular straight chair Postural control: Other (comment) (forward lean) Standing balance support: Bilateral upper extremity supported, During functional activity Standing balance-Leahy Scale: Fair Standing balance comment: requires forward support as posture tends to struggle with upright balance                            Cognition Arousal/Alertness: Awake/alert Behavior During Therapy: WFL for tasks assessed/performed Overall Cognitive Status: Within Functional Limits for tasks assessed  Exercises      General Comments General comments (skin integrity, edema, etc.): Pt is up to walk with PT and upon returning to room sat initially in unsupported chair, then in recliner with two pillows for sitting and back support      Pertinent Vitals/Pain Pain Assessment Pain Assessment:  Faces Faces Pain Scale: Hurts little more Pain Location: back Pain Descriptors / Indicators: Guarding, Grimacing Pain Intervention(s): Monitored during session, Repositioned, Premedicated before session    Home Living                          Prior Function            PT Goals (current goals can now be found in the care plan section) Acute Rehab PT Goals Patient Stated Goal: to get home and recover independent gait Progress towards PT goals: Progressing toward goals    Frequency    Min 5X/week      PT Plan Discharge plan needs to be updated    Co-evaluation              AM-PAC PT "6 Clicks" Mobility   Outcome Measure  Help needed turning from your back to your side while in a flat bed without using bedrails?: A Little Help needed moving from lying on your back to sitting on the side of a flat bed without using bedrails?: A Little Help needed moving to and from a bed to a chair (including a wheelchair)?: A Little Help needed standing up from a chair using your arms (e.g., wheelchair or bedside chair)?: A Little Help needed to walk in hospital room?: A Little Help needed climbing 3-5 steps with a railing? : A Lot 6 Click Score: 17    End of Session Equipment Utilized During Treatment: Gait belt Activity Tolerance: Patient tolerated treatment well;Patient limited by fatigue Patient left: in chair;with call bell/phone within reach;with family/visitor present Nurse Communication: Mobility status PT Visit Diagnosis: Other abnormalities of gait and mobility (R26.89);Muscle weakness (generalized) (M62.81);Pain Pain - Right/Left: Right Pain - part of body:  (back)     Time: 1610-9604 PT Time Calculation (min) (ACUTE ONLY): 12 min  Charges:  $Gait Training: 8-22 mins         Ivar Drape 08/07/2022, 6:17 PM  Samul Dada, PT PhD Acute Rehab Dept. Number: [facility] [id number] and [facility] [phone]

## 2022-08-07 NOTE — Progress Notes (Signed)
Inpatient Rehabilitation Admissions Coordinator   I met at bedside with patient, friend, Chrissie Noa and Daughter. I discussed the reasoning for denial from [facility]. They wish to pursue SNF and if not approved, will plan discharge home with [facility]. We are not filing appeal. I will alert acute team and TOC. We will sign off.  Ottie Glazier, RN, MSN Rehab Admissions Coordinator [phone] 08/07/2022 3:22 PM

## 2022-08-07 NOTE — Progress Notes (Signed)
Inpatient Rehabilitation Admissions Coordinator   I have received a denial from [facility] for Cir admit. They feel patient not in need of follow up with daily MD oversight at a hospital level rehab nor felt that she needed 3 hrs per day of therapy at her current functional level.Felt her needs can be met a another level of care of HH, OP or SNF I met with patient and her granddaughter at bedside and they are aware. I have offered to appeal that determination. She would like to speak with her doctor and her daughter before proceeding. I offered to contact her daughter to discuss, but patient prefers to speak with her first. I will follow up with patient later today.  Ottie Glazier, RN, MSN Rehab Admissions Coordinator [phone] 08/07/2022 11:24 AM

## 2022-08-07 NOTE — Progress Notes (Signed)
Orthopedic Surgery Post-operative Progress Note  Assessment: Patient is a 77 y.o. female who is currently admitted after undergoing L2/3 and L3/4 XLIF, L1-4 laminectomies, T10-pelvis PSIF   Plan: -Operative plans complete -Out of bed as tolerated, no brace -No bending/lifting/twisting greater than 10 pounds -PT evaluate and treat -Nursing to ambulate patient BID -Diabetic diet -Pain control -DVT ppx: xarelto -Disposition: IPR  Diabetes -continue home metformin, glipizide, and januvia  Hypocalcemia -continue oral supplementation  HTN -continue home amlodipine  HLD -continue home lipitor  GERD -continue home PPI  Depression -continue home cymbalta  ___________________________________________________________________________   Subjective: No acute events overnight. Pain continues to improve. No pain radiating into her legs.   Objective:  General: NAD, sitting up in chair Neurologic: alert, following commands, answering questions appropriately Respiratory: unlabored breathing on room air Skin: dressings clear/dry/intact  MSK (spine):  -Strength exam      Right  Left  EHL    5/5  5/5 TA    5/5  5/5 GSC    5/5  5/5 Knee extension  5/5  5/5 Hip flexion  5/5  4/5   -Sensory exam    Sensation intact to light touch in L3-S1 nerve distributions of bilateral lower extremities (decreased in left anterior thigh)   Patient name: Wendy [name] Patient MRN: [id number] Date: 08/07/22

## 2022-08-07 NOTE — Progress Notes (Signed)
   08/07/22 1702  Spiritual Encounters  Type of Visit Initial  Care provided to: Pt and family  Referral source Family  Reason for visit Religious ritual  OnCall Visit No  Spiritual Framework  Presenting Themes Meaning/purpose/sources of inspiration;Rituals and practive;Coping tools  Interventions  Spiritual Care Interventions Made Prayer;Established relationship of care and support;Compassionate presence;Reflective listening  Intervention Outcomes  Outcomes Reduced fear;Reduced anxiety   Daughter requested mother receive a visit as her family member was in another room EOL and rest of family with EOL patient and she could not visit. Visited with patient and talked about fear and concerns. Patient feels positive about her recovery and going home. Prayed with patient. Family members from other room began to form in this patients room. Advised patient that is she would like to continue conversation or have an visit she could contact the chaplains office.

## 2022-08-08 ENCOUNTER — Inpatient Hospital Stay (HOSPITAL_COMMUNITY): Payer: Medicare HMO

## 2022-08-08 DIAGNOSIS — Z4789 Encounter for other orthopedic aftercare: Secondary | ICD-10-CM | POA: Diagnosis not present

## 2022-08-08 DIAGNOSIS — M5459 Other low back pain: Secondary | ICD-10-CM | POA: Diagnosis not present

## 2022-08-08 DIAGNOSIS — R5383 Other fatigue: Secondary | ICD-10-CM | POA: Diagnosis not present

## 2022-08-08 DIAGNOSIS — M545 Low back pain, unspecified: Secondary | ICD-10-CM | POA: Diagnosis not present

## 2022-08-08 DIAGNOSIS — M4036 Flatback syndrome, lumbar region: Secondary | ICD-10-CM | POA: Diagnosis not present

## 2022-08-08 DIAGNOSIS — M6281 Muscle weakness (generalized): Secondary | ICD-10-CM | POA: Diagnosis not present

## 2022-08-08 DIAGNOSIS — E119 Type 2 diabetes mellitus without complications: Secondary | ICD-10-CM | POA: Diagnosis not present

## 2022-08-08 DIAGNOSIS — I10 Essential (primary) hypertension: Secondary | ICD-10-CM | POA: Diagnosis not present

## 2022-08-08 DIAGNOSIS — M4800 Spinal stenosis, site unspecified: Secondary | ICD-10-CM | POA: Diagnosis not present

## 2022-08-08 DIAGNOSIS — M438X9 Other specified deforming dorsopathies, site unspecified: Secondary | ICD-10-CM | POA: Diagnosis not present

## 2022-08-08 DIAGNOSIS — M79606 Pain in leg, unspecified: Secondary | ICD-10-CM | POA: Diagnosis not present

## 2022-08-08 DIAGNOSIS — G8929 Other chronic pain: Secondary | ICD-10-CM | POA: Diagnosis not present

## 2022-08-08 DIAGNOSIS — E1159 Type 2 diabetes mellitus with other circulatory complications: Secondary | ICD-10-CM | POA: Diagnosis not present

## 2022-08-08 DIAGNOSIS — R110 Nausea: Secondary | ICD-10-CM | POA: Diagnosis not present

## 2022-08-08 DIAGNOSIS — E569 Vitamin deficiency, unspecified: Secondary | ICD-10-CM | POA: Diagnosis not present

## 2022-08-08 DIAGNOSIS — E0840 Diabetes mellitus due to underlying condition with diabetic neuropathy, unspecified: Secondary | ICD-10-CM | POA: Diagnosis not present

## 2022-08-08 DIAGNOSIS — M069 Rheumatoid arthritis, unspecified: Secondary | ICD-10-CM | POA: Diagnosis not present

## 2022-08-08 DIAGNOSIS — M48062 Spinal stenosis, lumbar region with neurogenic claudication: Secondary | ICD-10-CM | POA: Diagnosis not present

## 2022-08-08 DIAGNOSIS — B182 Chronic viral hepatitis C: Secondary | ICD-10-CM | POA: Diagnosis not present

## 2022-08-08 DIAGNOSIS — Z981 Arthrodesis status: Secondary | ICD-10-CM | POA: Diagnosis not present

## 2022-08-08 DIAGNOSIS — E1140 Type 2 diabetes mellitus with diabetic neuropathy, unspecified: Secondary | ICD-10-CM | POA: Diagnosis not present

## 2022-08-08 LAB — GLUCOSE, CAPILLARY
Glucose-Capillary: 173 mg/dL — ABNORMAL HIGH (ref 70–99)
Glucose-Capillary: 151 mg/dL — ABNORMAL HIGH (ref 70–99)
Glucose-Capillary: 126 mg/dL — ABNORMAL HIGH (ref 70–99)
Glucose-Capillary: 97 mg/dL (ref 70–99)

## 2022-08-08 MED ORDER — METHOCARBAMOL 750 MG PO TABS: 750.0000 mg | ORAL_TABLET | Freq: Four times a day (QID) | ORAL | 0 refills | Status: AC

## 2022-08-08 MED ORDER — OXYCODONE HCL 5 MG PO TABS: 5.0000 mg | ORAL_TABLET | ORAL | 0 refills | Status: AC | PRN

## 2022-08-08 MED ORDER — MAGNESIUM CITRATE PO SOLN
1.0000 | Freq: Once | ORAL | Status: AC
  Administered 2022-08-08: 1 via ORAL
  Filled 2022-08-08: qty 296

## 2022-08-08 MED ORDER — SENNA 8.6 MG PO TABS
1.0000 | ORAL_TABLET | Freq: Two times a day (BID) | ORAL | Status: DC
  Administered 2022-08-08: 8.6 mg via ORAL
  Filled 2022-08-08: qty 1

## 2022-08-08 MED ORDER — RIVAROXABAN 10 MG PO TABS: 10.0000 mg | ORAL_TABLET | Freq: Every day | ORAL | 0 refills | Status: DC

## 2022-08-08 MED ORDER — ACETAMINOPHEN 500 MG PO TABS: 1000.0000 mg | ORAL_TABLET | Freq: Three times a day (TID) | ORAL | 0 refills | Status: AC

## 2022-08-08 MED ORDER — SENNA 8.6 MG PO TABS: 1.0000 | ORAL_TABLET | Freq: Two times a day (BID) | ORAL | 0 refills | Status: AC

## 2022-08-08 MED ORDER — POLYETHYLENE GLYCOL 3350 17 G PO PACK: 17.0000 g | PACK | Freq: Every day | ORAL | Status: DC

## 2022-08-08 MED ORDER — ONDANSETRON HCL 4 MG PO TABS
8.0000 mg | ORAL_TABLET | Freq: Once | ORAL | Status: AC
  Administered 2022-08-08: 8 mg via ORAL

## 2022-08-08 NOTE — Progress Notes (Signed)
Report given to Psychologist, educational at [employer]. Patient being transported by daughter. Discharge instructions reviewed with patient and family.

## 2022-08-08 NOTE — [place] FL2 (Signed)
[place] MEDICAID FL2 LEVEL OF CARE FORM     IDENTIFICATION  Patient Name: Wendy [name] Birthdate: 12/01/1945 Sex: female Admission Date (Current Location): 07/29/2022  [facility] and [place] Number:  Producer, television/film/video and Address:  [facility], [address], [place], [place] [address]      Provider Number: [id number]  Attending Physician Name and Address:  London Sheer, MD  Relative Name and Phone Number:  Bascom Levels  [phone]    Current Level of Care: Hospital Recommended Level of Care: Skilled Nursing Facility Prior Approval Number:    Date Approved/Denied:   PASRR Number: [id number]  Discharge Plan: SNF    Current Diagnoses: Patient Active Problem List   Diagnosis Date Noted   Type 2 diabetes mellitus treated without insulin (HCC) 07/30/2022   Endotracheal tube present 07/30/2022   S/P lumbar fusion 07/29/2022   Lumbar stenosis with neurogenic claudication 07/29/2022   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   Type 2 diabetes mellitus with diabetic neuropathy, unspecified (HCC) 12/26/2020   FOOT PAIN 05/23/2009   LOW BACK PAIN SYNDROME 07/21/2007   DEPENDENT EDEMA 07/21/2007   HEPATITIS C 02/08/2007   GERD 11/20/2006   Diabetic neuropathy, type II diabetes mellitus (HCC) 11/05/2006   Essential hypertension 11/05/2006   Rheumatoid arthritis (HCC) 11/05/2006   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    Orientation RESPIRATION BLADDER Height & Weight     Self, Time, Situation, Place  Normal Continent Weight: 180 lb (81.6 kg) Height:  5\' 2"  (157.5 cm)  BEHAVIORAL SYMPTOMS/MOOD NEUROLOGICAL BOWEL NUTRITION STATUS      Continent Diet (See DC summary)  AMBULATORY STATUS COMMUNICATION OF NEEDS Skin   Limited Assist Verbally Surgical wounds (Back Incision)                       Personal Care Assistance Level of Assistance  Bathing, Feeding, Dressing Bathing Assistance: Limited assistance Feeding assistance: Independent Dressing  Assistance: Limited assistance     Functional Limitations Info  Sight, Hearing, Speech Sight Info: Adequate Hearing Info: Adequate Speech Info: Adequate    SPECIAL CARE FACTORS FREQUENCY  PT (By licensed PT), OT (By licensed OT)     PT Frequency: 5x week OT Frequency: 5x week            Contractures Contractures Info: Not present    Additional Factors Info  Code Status, Allergies, Psychotropic, Insulin Sliding Scale Code Status Info: Full Allergies Info: Asprin, Tramadol Psychotropic Info: Duloxetine Insulin Sliding Scale Info: See DC summary       Current Medications (08/08/2022):  This is the current hospital active medication list Current Facility-Administered Medications  Medication Dose Route Frequency Provider Last Rate Last Admin   acetaminophen (TYLENOL) tablet 1,000 mg  1,000 mg Oral Q8H London Sheer, MD   1,000 mg at 08/08/22 0503   amLODipine (NORVASC) tablet 10 mg  10 mg Oral Daily London Sheer, MD   10 mg at 08/07/22 8469   atorvastatin (LIPITOR) tablet 10 mg  10 mg Oral Daily London Sheer, MD   10 mg at 08/07/22 6295   Chlorhexidine Gluconate Cloth 2 % PADS 6 each  6 each Topical Q2000 London Sheer, MD   6 each at 08/08/22 0543   cycloSPORINE (RESTASIS) 0.05 % ophthalmic emulsion 1 drop  1 drop Both Eyes BID London Sheer, MD   1 drop at 08/07/22 2112   DULoxetine (CYMBALTA) DR capsule 30 mg  30 mg Oral  QHS London Sheer, MD   30 mg at 08/07/22 2112   fluticasone (FLONASE) 50 MCG/ACT nasal spray 1 spray  1 spray Each Nare Daily PRN London Sheer, MD       furosemide (LASIX) tablet 20 mg  20 mg Oral Daily PRN London Sheer, MD   20 mg at 08/07/22 1647   glipiZIDE (GLUCOTROL) tablet 5 mg  5 mg Oral BID AC London Sheer, MD   5 mg at 08/07/22 4098   hydroxychloroquine (PLAQUENIL) tablet 200 mg  200 mg Oral Daily London Sheer, MD   200 mg at 08/07/22 1191   insulin aspart (novoLOG) injection 0-15 Units  0-15 Units Subcutaneous TID  WC London Sheer, MD       linagliptin (TRADJENTA) tablet 5 mg  5 mg Oral QHS London Sheer, MD   5 mg at 08/07/22 2114   lip balm (CARMEX) ointment   Topical PRN London Sheer, MD       loratadine (CLARITIN) tablet 10 mg  10 mg Oral QPM London Sheer, MD   10 mg at 08/07/22 1839   magnesium citrate solution 1 Bottle  1 Bottle Oral Once London Sheer, MD       metFORMIN (GLUCOPHAGE) tablet 500 mg  500 mg Oral BID WC London Sheer, MD   500 mg at 08/07/22 1838   methocarbamol (ROBAXIN) tablet 750 mg  750 mg Oral QID London Sheer, MD   750 mg at 08/07/22 2112   Oral care mouth rinse  15 mL Mouth Rinse PRN London Sheer, MD       oxyCODONE (Oxy IR/ROXICODONE) immediate release tablet 10 mg  10 mg Oral Q4H London Sheer, MD   10 mg at 08/08/22 0503   pantoprazole (PROTONIX) EC tablet 40 mg  40 mg Oral Daily London Sheer, MD   40 mg at 08/07/22 0921   [START ON 08/09/2022] polyethylene glycol (MIRALAX / GLYCOLAX) packet 17 g  17 g Oral Daily London Sheer, MD       potassium chloride (KLOR-CON M) CR tablet 10 mEq  10 mEq Oral Daily London Sheer, MD   10 mEq at 08/07/22 0924   promethazine (PHENERGAN) 12.5 mg in sodium chloride 0.9 % 50 mL IVPB  12.5 mg Intravenous Q6H PRN London Sheer, MD 200 mL/hr at 08/05/22 0604 12.5 mg at 08/05/22 0604   rivaroxaban (XARELTO) tablet 10 mg  10 mg Oral Daily London Sheer, MD   10 mg at 08/07/22 4782   senna (SENOKOT) tablet 8.6 mg  1 tablet Oral BID London Sheer, MD         Discharge Medications: Please see discharge summary for a list of discharge medications.  Relevant Imaging Results:  Relevant Lab Results:   Additional Information SS# [id number], [place]

## 2022-08-08 NOTE — TOC Transition Note (Signed)
Transition of Care [facility]) - CM/SW Discharge Note   Patient Details  Name: Wendy [name] MRN: [id number] Date of Birth: 12-Mar-1945  [facility]) CM/SW Contact:  Carley Hammed, LCSW Phone Number: 08/08/2022, 11:55 AM   Clinical Narrative:    Pt to be transported to [facility] via dtr. Nurse to call report to [phone]   Final next level of care: Skilled Nursing Facility Barriers to Discharge: Barriers Resolved   Patient Goals and CMS Choice CMS Medicare.gov Compare Post Acute Care list provided to:: Patient Choice offered to / list presented to : Patient, Adult Children  Discharge Placement                Patient chooses bed at: [facility] Patient to be transferred to facility by: Daughter Name of family member notified: Angelique Blonder Patient and family notified of of transfer: 08/08/22  Discharge Plan and Services Additional resources added to the After Visit Summary for       Post Acute Care Choice: Skilled Nursing Facility                               Social Determinants of Health (SDOH) Interventions SDOH Screenings   Food Insecurity: No Food Insecurity (11/29/2021)  Housing: Low Risk  (11/29/2021)  Transportation Needs: No Transportation Needs (11/29/2021)  Alcohol Screen: Low Risk  (11/29/2021)  Depression (PHQ2-9): Medium Risk (03/17/2022)  Financial Resource Strain: Low Risk  (01/16/2022)  Physical Activity: Insufficiently Active (11/29/2021)  Social Connections: Socially Integrated (11/29/2021)  Stress: No Stress Concern Present (11/29/2021)  Tobacco Use: Medium Risk (07/29/2022)     Readmission Risk Interventions     No data to display

## 2022-08-08 NOTE — TOC Progression Note (Signed)
Transition of Care [facility]) - Progression Note    Patient Details  Name: Wendy [name] MRN: [id number] Date of Birth: [birth year]  Transition of Care [facility]) CM/SW Contact  Carley Hammed, LCSW Phone Number: 08/08/2022, 11:21 AM  Clinical Narrative:    CSW advised that family will not pursue appeal for CIR. Pt notes she is agreeable to SNF with no preference. She states her daughter will help her make the decision. CSW provided bed offers and Medicare.gov info to dtr. She discussed with the pt and they chose [facility]. CSW notified facility and will initiate insurance authorization. TOC will continue to follow.    Expected Discharge Plan: Skilled Nursing Facility Barriers to Discharge: SNF Pending bed offer, Insurance Authorization, Continued Medical Work up  Expected Discharge Plan and Services     Post Acute Care Choice: Skilled Nursing Facility Living arrangements for the past 2 months: Single Family Home                                       Social Determinants of Health (SDOH) Interventions SDOH Screenings   Food Insecurity: No Food Insecurity (11/29/2021)  Housing: Low Risk  (11/29/2021)  Transportation Needs: No Transportation Needs (11/29/2021)  Alcohol Screen: Low Risk  (11/29/2021)  Depression (PHQ2-9): Medium Risk (03/17/2022)  Financial Resource Strain: Low Risk  (01/16/2022)  Physical Activity: Insufficiently Active (11/29/2021)  Social Connections: Socially Integrated (11/29/2021)  Stress: No Stress Concern Present (11/29/2021)  Tobacco Use: Medium Risk (07/29/2022)    Readmission Risk Interventions     No data to display

## 2022-08-08 NOTE — Progress Notes (Signed)
PT Cancellation Note  Patient Details Name: Wendy [name] MRN: [id number] DOB: Sep 20, 1945   Cancelled Treatment:    Reason Eval/Treat Not Completed: Other (comment).  Declined therapy reporting she was nauseated and vomiting, also leaving and expecting therapy to see her there.   Ivar Drape 08/08/2022, 3:47 PM  Samul Dada, PT PhD Acute Rehab Dept. Number: [facility] [id number] and [facility] [phone]

## 2022-08-08 NOTE — Progress Notes (Addendum)
Orthopedic Surgery Post-operative Progress Note  Assessment: Patient is a 77 y.o. female who is currently admitted after undergoing L2/3 and L3/4 XLIF, L1-4 laminectomies, T10-pelvis PSIF   Plan: -Operative plans complete -Out of bed as tolerated, no brace -No bending/lifting/twisting greater than 10 pounds -PT evaluate and treat -Nursing to ambulate patient BID -Diabetic diet -Pain control -DVT ppx: xarelto -Disposition: SNF  Diabetes -continue home metformin, glipizide, and januvia  Hypocalcemia -continue oral supplementation  HTN -continue home amlodipine  HLD -continue home lipitor  GERD -continue home PPI  Depression -continue home cymbalta  ___________________________________________________________________________   Subjective: No acute events overnight. Slept most of the night. Felt sessions with PT and OT went well yesterday. Sensation in left thigh continues to improve. There is still decreased sensation when compared to the contralateral side. No other numbness or paresthesias. Understands IPR was denied by her insurance company and wants to start process to try to go to SNF.  Objective:  General: laying in bed sleeping, awakes to voice Neurologic: alert, following commands, answering questions appropriately Respiratory: unlabored breathing on room air Skin: incision well approximated with no erythema, induration, active/expressible drainage  MSK (spine):  -Strength exam      Right  Left  EHL    5/5  5/5 TA    5/5  5/5 GSC    5/5  5/5 Knee extension  5/5  5/5 Hip flexion  5/5  4/5   -Sensory exam    Sensation intact to light touch in L3-S1 nerve distributions of bilateral lower extremities (decreased in left anterior thigh)   Patient name: Wendy [name] Patient MRN: [id number] Date: 08/08/22

## 2022-08-09 DIAGNOSIS — M545 Low back pain, unspecified: Secondary | ICD-10-CM | POA: Diagnosis not present

## 2022-08-09 DIAGNOSIS — E1159 Type 2 diabetes mellitus with other circulatory complications: Secondary | ICD-10-CM | POA: Diagnosis not present

## 2022-08-09 DIAGNOSIS — M6281 Muscle weakness (generalized): Secondary | ICD-10-CM | POA: Diagnosis not present

## 2022-08-09 DIAGNOSIS — I10 Essential (primary) hypertension: Secondary | ICD-10-CM | POA: Diagnosis not present

## 2022-08-09 DIAGNOSIS — M48062 Spinal stenosis, lumbar region with neurogenic claudication: Secondary | ICD-10-CM | POA: Diagnosis not present

## 2022-08-10 DIAGNOSIS — M545 Low back pain, unspecified: Secondary | ICD-10-CM | POA: Diagnosis not present

## 2022-08-10 DIAGNOSIS — M79606 Pain in leg, unspecified: Secondary | ICD-10-CM | POA: Diagnosis not present

## 2022-08-10 DIAGNOSIS — M6281 Muscle weakness (generalized): Secondary | ICD-10-CM | POA: Diagnosis not present

## 2022-08-10 DIAGNOSIS — M48062 Spinal stenosis, lumbar region with neurogenic claudication: Secondary | ICD-10-CM | POA: Diagnosis not present

## 2022-08-11 ENCOUNTER — Encounter: Payer: Medicare HMO | Admitting: Orthopedic Surgery

## 2022-08-11 ENCOUNTER — Encounter (HOSPITAL_COMMUNITY): Payer: Self-pay | Admitting: Orthopedic Surgery

## 2022-08-11 DIAGNOSIS — R110 Nausea: Secondary | ICD-10-CM | POA: Diagnosis not present

## 2022-08-11 DIAGNOSIS — G8929 Other chronic pain: Secondary | ICD-10-CM | POA: Diagnosis not present

## 2022-08-11 DIAGNOSIS — R5383 Other fatigue: Secondary | ICD-10-CM | POA: Diagnosis not present

## 2022-08-12 ENCOUNTER — Encounter (HOSPITAL_COMMUNITY): Payer: Self-pay | Admitting: Orthopedic Surgery

## 2022-08-12 DIAGNOSIS — M6281 Muscle weakness (generalized): Secondary | ICD-10-CM | POA: Diagnosis not present

## 2022-08-12 DIAGNOSIS — G8929 Other chronic pain: Secondary | ICD-10-CM | POA: Diagnosis not present

## 2022-08-12 DIAGNOSIS — M4800 Spinal stenosis, site unspecified: Secondary | ICD-10-CM | POA: Diagnosis not present

## 2022-08-12 NOTE — Progress Notes (Unsigned)
Care Management & Coordination Services Pharmacy Note  08/12/2022 Name:  Wendy [name] MRN:  [id number] DOB:  November 25, 1945  Summary: ***  Recommendations/Changes made from today's visit: ***  Follow up plan: ***   Subjective: Wendy [name] is an 77 y.o. year old female who is a primary patient of [name], MD.  The care coordination team was consulted for assistance with disease management and care coordination needs.    {CCMTELEPHONEFACETOFACE:21091510} for follow up visit.  Recent office visits: 05/16/22 Gershon Crane, MD - For chronic narcotic use and other concerns. Decrease Glipizide to 1/2 tab (5mg  BID)  04/15/22 Antonietta Jewel in Radiology - Encounter for Mammogram  03/17/2022 Gershon Crane MD - Patient was seen for Blister of left heel, initial encounter. Started Cephalexin 500 mg three times daily.    02/14/2022 Gershon Crane MD - Patient was seen for chronic narcotic use and an additional concern. Started Duloxetine 30 mg daily and Norco 10/325 mg 1 tablet q 6 hours prn.     Recent consult visits: 03/18/2022 Willia Craze MD (ortho surgery) - Patient was seen for chronic bilateral low back pain without sciatica and an additional concern. No medication changes.    03/17/2022 Madelyn Brunner DO (sport med) - Patient was seen for Chronic bilateral low back pain without sciatica and additional concerns. Started Meloxicam 15 mg daily.  Hospital visits: 07/29/22 [facility] For lumbar fusion, LOS 10 days. START tylenol, oxycodone, xarelto, and senna. Changed methocarbamol. Stop pregabalin, aleve, norco   Objective:  Lab Results  Component Value Date   CREATININE 0.88 08/03/2022   BUN 16 08/03/2022   GFR 77.01 10/08/2021   GFRNONAA >60 08/03/2022   GFRAA >60 12/24/2016   NA 137 08/03/2022   K 3.8 08/03/2022   CALCIUM 7.7 (L) 08/03/2022   CO2 24 08/03/2022   GLUCOSE 145 (H) 08/03/2022    Lab Results  Component Value Date/Time   HGBA1C 6.4 (H) 07/18/2022  03:02 PM   HGBA1C 6.6 (A) 05/16/2022 01:44 PM   HGBA1C 7.5 (H) 10/08/2021 01:28 PM   GFR 77.01 10/08/2021 01:28 PM   GFR 84.19 09/18/2020 01:42 PM   MICROALBUR 0.3 10/19/2013 09:44 AM   MICROALBUR 1.2 03/30/2009 03:00 PM    Last diabetic Eye exam:  Lab Results  Component Value Date/Time   HMDIABEYEEXA No Retinopathy 05/22/2015 10:11 AM    Last diabetic Foot exam: No results found for: "HMDIABFOOTEX"   Lab Results  Component Value Date   CHOL 152 10/08/2021   HDL 56.20 10/08/2021   LDLCALC 82 10/08/2021   LDLDIRECT 143.6 06/05/2010   TRIG 68.0 10/08/2021   CHOLHDL 3 10/08/2021       Latest Ref Rng & Units 07/18/2022    3:01 PM 12/12/2021    6:53 PM 10/08/2021    1:28 PM  Hepatic Function  Total Protein 6.5 - 8.1 g/dL 7.4  7.6  7.9   Albumin 3.5 - 5.0 g/dL 3.5  3.7  4.1   AST 15 - 41 U/L 20  21  20    ALT 0 - 44 U/L 16  16  13    Alk Phosphatase 38 - 126 U/L 55  63  61   Total Bilirubin 0.3 - 1.2 mg/dL 0.8  0.6  0.7   Bilirubin, Direct 0.0 - 0.3 mg/dL   0.1     Lab Results  Component Value Date/Time   TSH 1.18 10/08/2021 01:28 PM   TSH 1.30 09/18/2020 01:42 PM   FREET4 0.74 09/18/2020  01:42 PM   FREET4 0.74 08/25/2011 12:17 PM       Latest Ref Rng & Units 08/03/2022   12:21 AM 08/02/2022    6:14 PM 08/02/2022   12:27 AM  CBC  WBC 4.0 - 10.5 K/uL 9.3   10.9   Hemoglobin 12.0 - 15.0 g/dL 7.9  8.4  6.8   Hematocrit 36.0 - 46.0 % 25.0  26.0  22.0   Platelets 150 - 400 K/uL 177   131     No results found for: "VD25OH", "VITAMINB12"  Clinical ASCVD: No  The ASCVD Risk score (Arnett DK, et al., 2019) failed to calculate for the following reasons:   Unable to determine if patient is Non-Hispanic African American         03/17/2022    1:49 PM 02/14/2022    2:52 PM 12/16/2021    2:07 PM  Depression screen PHQ 2/9  Decreased Interest 0 0 3  Down, Depressed, Hopeless 2 0 1  PHQ - 2 Score 2 0 4  Altered sleeping 1 0 2  Tired, decreased energy 3 3 3   Change in  appetite 1  0  Feeling bad or failure about yourself  1  0  Trouble concentrating 2  0  Moving slowly or fidgety/restless 0  0  Suicidal thoughts 0  0  PHQ-9 Score 10 3 9   Difficult doing work/chores Extremely dIfficult  Not difficult at all     Social History   Tobacco Use  Smoking Status Former   Packs/day: 0.03   Years: 45.00   Additional pack years: 0.00   Total pack years: 1.35   Types: Cigarettes, Cigars   Quit date: 04/19/2022   Years since quitting: 0.3  Smokeless Tobacco Never  Tobacco Comments   1 black and mild every 2-3 days- no cigs - doesnt smoke the whole thing (quit smoking cigarettes around 2019)   BP Readings from Last 3 Encounters:  08/08/22 (!) 134/42  07/18/22 (!) 172/92  05/16/22 134/82   Pulse Readings from Last 3 Encounters:  08/08/22 73  07/18/22 83  05/16/22 86   Wt Readings from Last 3 Encounters:  07/29/22 180 lb (81.6 kg)  07/18/22 178 lb 8 oz (81 kg)  05/16/22 174 lb (78.9 kg)   BMI Readings from Last 3 Encounters:  07/29/22 32.92 kg/m  07/18/22 32.65 kg/m  05/16/22 29.87 kg/m    Allergies  Allergen Reactions   Aspirin Nausea And Vomiting   Tramadol Nausea And Vomiting    Medications Reviewed Today     Reviewed by Shary Decamp, CRNA (Certified Registered Nurse Anesthetist) on 07/29/22 at [phone]  Med List Status: Complete   Medication Order Taking? Sig Documenting Provider Last Dose Status Informant  Accu-Chek Softclix Lancets lancets [id number]  Test once per day and diagnosis code is E 11.9 and dispense for (ACCU-CHEK Soft Touch) [name], MD  Active Self, Pharmacy Records  ALPRAZolam Prudy Feeler) 1 MG tablet [id number] Yes TAKE 1 TABLET (1 MG TOTAL) BY MOUTH AT BEDTIME AS NEEDED FOR ANXIETY OR SLEEP. [name], MD Past Week Active Self, Pharmacy Records  amLODipine [facility]) 10 MG tablet [id number] Yes TAKE 1 TABLET BY MOUTH EVERY DAY [name], MD 07/29/2022 [phone] Active Self, Pharmacy Records  atorvastatin  (LIPITOR) 10 MG tablet [id number] Yes Take 1 tablet (10 mg total) by mouth daily. [name], MD 07/29/2022 [phone] Active Self, Pharmacy Records  b complex vitamins tablet [id number] Yes Take 1 tablet by mouth daily.  [provider] 07/28/2022 Active Self, Pharmacy Records  Blood Glucose Monitoring Suppl (ACCU-CHEK GUIDE) w/Device KIT [id number]  Use to test sugars daily. [name], MD  Active Self, Pharmacy Records  Blood Pressure Monitor KIT [id number]  Use to check blood pressure. [name], MD  Active Self, Pharmacy Records  Cholecalciferol (VITAMIN D3) 20 MCG (800 UNIT) TABS [id number] Yes Take 800 Units by mouth daily. [provider] 07/28/2022 Active Self, Pharmacy Records  DULoxetine (CYMBALTA) 30 MG capsule [id number] Yes Take 1 capsule (30 mg total) by mouth daily.  Patient taking differently: Take 30 mg by mouth at bedtime.   [name], MD 07/29/2022 [phone] Active Self, Pharmacy Records  fluticasone [facility]) 50 MCG/ACT nasal spray [id number] Yes SPRAY 2 SPRAYS INTO EACH NOSTRIL EVERY DAY  Patient taking differently: Place 1 spray into both nostrils daily as needed for allergies or rhinitis.   [name], MD 07/28/2022 Active Self, Pharmacy Records  furosemide (LASIX) 20 MG tablet [id number] Yes Take 1 tablet (20 mg total) by mouth daily as needed (fluid retention). [name], MD Past Week Active Self, Pharmacy Records  glipiZIDE (GLUCOTROL) 10 MG tablet [id number] Yes Take 0.5 tablets (5 mg total) by mouth 2 (two) times daily before a meal. [name], MD 07/28/2022 Active Self, Pharmacy Records  glucose blood (ACCU-CHEK GUIDE) test strip [id number]  USE TO TEST ONCE DAILY [name], MD  Active Self, Pharmacy Records  HYDROcodone-acetaminophen [facility]) 10-325 MG tablet [id number] Yes Take 1 tablet by mouth every 6 (six) hours as needed for moderate pain. [name], MD 07/28/2022 Active Self, Pharmacy Records  hydroxychloroquine (PLAQUENIL) 200 MG  tablet [id number] Yes Take 1 tablet (200 mg total) by mouth daily. [name], MD 07/28/2022 Active Self, Pharmacy Records  JANUVIA 100 MG tablet [id number] Yes TAKE 1 TABLET BY MOUTH EVERY DAY  Patient taking differently: Take 100 mg by mouth at bedtime.   [name], MD 07/28/2022 Active Self, Pharmacy Records  Lancets [facility] SOFT [facility]) lancets [id number]  Test once per day and diagnosis code is E 11.9 and dispense for Aviva plus [name], MD  Active Self, Pharmacy Records  levocetirizine (XYZAL) 5 MG tablet [id number] Yes Take 1 tablet (5 mg total) by mouth every evening. [name], MD 07/28/2022 Active Self, Pharmacy Records  metFORMIN (GLUCOPHAGE) 500 MG tablet [id number] Yes Take 1 tablet (500 mg total) by mouth 2 (two) times daily with a meal. [name], MD 07/28/2022 Active Self, Pharmacy Records  methocarbamol (ROBAXIN) 500 MG tablet [id number] Yes TAKE 1 TABLET BY MOUTH EVERY 6 HOURS AS NEEDED FOR MUSCLE SPASMS. [name], MD 07/29/2022 [phone] Active Self, Pharmacy Records  naproxen sodium (ALEVE) 220 MG tablet [id number] Yes Take 220 mg by mouth 2 (two) times daily as needed (pain). [provider] not taking Active Self, Pharmacy Records  omeprazole (PRILOSEC) 40 MG capsule [id number] Yes TAKE 1 CAPSULE BY MOUTH EVERY DAY [name], MD 07/29/2022 [phone] Active Self, Pharmacy Records  ondansetron (ZOFRAN-ODT) 4 MG disintegrating tablet [id number] Yes TAKE 1 TABLET BY MOUTH EVERY 8 HOURS AS NEEDED FOR NAUSEA AND VOMITING [name], MD 07/29/2022 [phone] Active Self, Pharmacy Records  polyethylene glycol (MIRALAX / GLYCOLAX) 17 g packet [id number] Yes Take 17 g by mouth daily as needed for mild constipation. [provider] Past Month Active Self, Pharmacy Records  potassium chloride (KLOR-CON) 10 MEQ tablet [id number] Yes Take 1 tablet (10 mEq total)  by mouth daily. [name], MD 07/29/2022 [phone] Active Self, Pharmacy Records  pregabalin (LYRICA) 300 MG  capsule [id number] Yes Take 1 capsule (300 mg total) by mouth at bedtime. [name], MD 07/28/2022 Active Self, Pharmacy Records  RESTASIS 0.05 % ophthalmic emulsion [id number] Yes Place 1 drop into both eyes 2 (two) times daily. [provider] 07/28/2022 Active Self, Pharmacy Records            SDOH:  (Social Determinants of Health) assessments and interventions performed: Yes SDOH Interventions    Flowsheet Row Chronic Care Management from 01/10/2022 in [facility] [place] Clinical Support from 11/29/2021 in [facility] [place] Clinical Support from 11/06/2020 in [facility] [place] Clinical Support from 10/11/2019 in [facility] [place] Chronic Care Management from 08/24/2019 in [facility] [place]  SDOH Interventions       Food Insecurity Interventions -- Intervention Not Indicated Intervention Not Indicated Intervention Not Indicated --  Housing Interventions -- Intervention Not Indicated Intervention Not Indicated Intervention Not Indicated --  Transportation Interventions -- Intervention Not Indicated Intervention Not Indicated -- Intervention Not Indicated  Alcohol Usage Interventions -- Intervention Not Indicated (Score <7) -- -- --  Depression Interventions/Treatment  -- -- -- PHQ2-9 Score <4 Follow-up Not Indicated --  Financial Strain Interventions Intervention Not Indicated Intervention Not Indicated Intervention Not Indicated -- Intervention Not Indicated  Physical Activity Interventions -- Intervention Not Indicated Intervention Not Indicated Other (Comments)  [Patient's states that her job is very physical states that she does not exercise outside of work] --  Stress Interventions -- Intervention Not Indicated Intervention Not Indicated Intervention Not Indicated --  Social Connections Interventions -- Intervention Not Indicated Intervention Not  Indicated Intervention Not Indicated --       Medication Assistance: {MEDASSISTANCEINFO:25044}  Medication Access: Within the past 30 days, how often has patient missed a dose of medication? *** Is a pillbox or other method used to improve adherence? {YES/NO:21197} Factors that may affect medication adherence? {CHL DESC; BARRIERS:21522} Are meds synced by current pharmacy? {YES/NO:21197} Are meds delivered by current pharmacy? {YES/NO:21197} Does patient experience delays in picking up medications due to transportation concerns? {YES/NO:21197}  Name and location of Current pharmacy:  [id number] - [place], [place] - [address] AT [address] [place] [address] Phone: [phone] Fax: [phone]  [id number] - [place],  - [address] [place] [address] Phone: [phone] Fax: [phone]    Compliance/Adherence/Medication fill history: Care Gaps: A1C -  last 07/18/22 Diabetic Kidney Eval (Urine microalbumin - last showing 2015) Diabetic foot exam Covid Vaccine - Booster  Star-Rating Drugs: Atorvastatin 10mg  PDC 100% Glipizide 10mg  PDC 92% Januvia 100mg  PDC 100% Metformin 500mg  PDC 100%   Assessment/Plan   Hypertension (BP goal <130/80) -{US controlled/uncontrolled:25276} -Current treatment: Amlodipine 10mg  1 qd Lasix 20mg  1 daily prn -Medications previously tried: Lisnopril/HCTZ  -Current home readings: *** -Current dietary habits: *** -Current exercise habits: *** -{ACTIONS;DENIES/REPORTS:21021675::"Denies"} hypotensive/hypertensive symptoms -Educated on {CCM BP Counseling:25124} -Counseled to monitor BP at home ***, document, and provide log at future appointments -{CCMPHARMDINTERVENTION:25122}  Diabetes (A1c goal <7%) -Controlled -Current medications: Glipizide 5mg  BID Januvia 100mg  1 qd Metformin 500mg  1 BID -Medications previously tried: ***  -Current home  glucose readings fasting glucose: 65 post prandial glucose: *** -{ACTIONS;DENIES/REPORTS:21021675::"Denies"} hypoglycemic/hyperglycemic symptoms -Current meal patterns:  breakfast: ***  lunch: ***  dinner: *** snacks: ***  drinks: *** -Current exercise: *** -Educated on {CCM DM COUNSELING:25123} -Counseled to check feet daily and get yearly eye exams -{CCMPHARMDINTERVENTION:25122}  Sherrill Raring Clinical Pharmacist [phone]

## 2022-08-13 ENCOUNTER — Other Ambulatory Visit: Payer: Self-pay | Admitting: Family Medicine

## 2022-08-13 DIAGNOSIS — B182 Chronic viral hepatitis C: Secondary | ICD-10-CM | POA: Diagnosis not present

## 2022-08-13 DIAGNOSIS — E119 Type 2 diabetes mellitus without complications: Secondary | ICD-10-CM | POA: Diagnosis not present

## 2022-08-13 DIAGNOSIS — E569 Vitamin deficiency, unspecified: Secondary | ICD-10-CM | POA: Diagnosis not present

## 2022-08-13 DIAGNOSIS — E0840 Diabetes mellitus due to underlying condition with diabetic neuropathy, unspecified: Secondary | ICD-10-CM | POA: Diagnosis not present

## 2022-08-14 DIAGNOSIS — M6281 Muscle weakness (generalized): Secondary | ICD-10-CM | POA: Diagnosis not present

## 2022-08-14 DIAGNOSIS — M545 Low back pain, unspecified: Secondary | ICD-10-CM | POA: Diagnosis not present

## 2022-08-14 DIAGNOSIS — M48062 Spinal stenosis, lumbar region with neurogenic claudication: Secondary | ICD-10-CM | POA: Diagnosis not present

## 2022-08-15 DIAGNOSIS — E1159 Type 2 diabetes mellitus with other circulatory complications: Secondary | ICD-10-CM | POA: Diagnosis not present

## 2022-08-15 DIAGNOSIS — R110 Nausea: Secondary | ICD-10-CM | POA: Diagnosis not present

## 2022-08-15 DIAGNOSIS — M545 Low back pain, unspecified: Secondary | ICD-10-CM | POA: Diagnosis not present

## 2022-08-15 DIAGNOSIS — M79606 Pain in leg, unspecified: Secondary | ICD-10-CM | POA: Diagnosis not present

## 2022-08-15 DIAGNOSIS — M48062 Spinal stenosis, lumbar region with neurogenic claudication: Secondary | ICD-10-CM | POA: Diagnosis not present

## 2022-08-15 DIAGNOSIS — M6281 Muscle weakness (generalized): Secondary | ICD-10-CM | POA: Diagnosis not present

## 2022-08-15 DIAGNOSIS — I10 Essential (primary) hypertension: Secondary | ICD-10-CM | POA: Diagnosis not present

## 2022-08-15 DIAGNOSIS — R5383 Other fatigue: Secondary | ICD-10-CM | POA: Diagnosis not present

## 2022-08-15 DIAGNOSIS — G8929 Other chronic pain: Secondary | ICD-10-CM | POA: Diagnosis not present

## 2022-08-16 ENCOUNTER — Encounter (HOSPITAL_COMMUNITY): Payer: Self-pay | Admitting: Orthopedic Surgery

## 2022-08-18 ENCOUNTER — Telehealth: Payer: Self-pay | Admitting: Family Medicine

## 2022-08-18 ENCOUNTER — Telehealth: Payer: Self-pay

## 2022-08-18 NOTE — Progress Notes (Signed)
   08/18/2022  Patient ID: Wendy [name], female   DOB: 1945/07/01, 77 y.o.   MRN: [id number]  Received a message from Dr. Claris Che office that patient had called, and was not really clear on her needs but kept stating a medication/pharmacy change was needed.  Patient outreach attempt to see how I can assist was unsuccessful.  Left my name and direct phone number for patient to call me back at her convenience.  Lenna Gilford, PharmD, DPLA

## 2022-08-18 NOTE — Telephone Encounter (Signed)
Lost meds in uber/lyft requesting refill of ondansetron (ZOFRAN-ODT) 4 MG disintegrating tablet  [id number] [place], [place] - [address] AT [facility] Phone: [phone]  Fax: [phone]

## 2022-08-20 ENCOUNTER — Telehealth: Payer: Self-pay | Admitting: Orthopedic Surgery

## 2022-08-20 DIAGNOSIS — F419 Anxiety disorder, unspecified: Secondary | ICD-10-CM | POA: Diagnosis not present

## 2022-08-20 DIAGNOSIS — E1140 Type 2 diabetes mellitus with diabetic neuropathy, unspecified: Secondary | ICD-10-CM | POA: Diagnosis not present

## 2022-08-20 DIAGNOSIS — B182 Chronic viral hepatitis C: Secondary | ICD-10-CM | POA: Diagnosis not present

## 2022-08-20 DIAGNOSIS — M069 Rheumatoid arthritis, unspecified: Secondary | ICD-10-CM | POA: Diagnosis not present

## 2022-08-20 DIAGNOSIS — I10 Essential (primary) hypertension: Secondary | ICD-10-CM | POA: Diagnosis not present

## 2022-08-20 DIAGNOSIS — Z4789 Encounter for other orthopedic aftercare: Secondary | ICD-10-CM | POA: Diagnosis not present

## 2022-08-20 DIAGNOSIS — M48061 Spinal stenosis, lumbar region without neurogenic claudication: Secondary | ICD-10-CM | POA: Diagnosis not present

## 2022-08-20 DIAGNOSIS — F32A Depression, unspecified: Secondary | ICD-10-CM | POA: Diagnosis not present

## 2022-08-20 DIAGNOSIS — G8929 Other chronic pain: Secondary | ICD-10-CM | POA: Diagnosis not present

## 2022-08-20 NOTE — Telephone Encounter (Signed)
I called and lmovm giving verbal auth for PT

## 2022-08-20 NOTE — Telephone Encounter (Signed)
Spoke w/Kate w/[facility] [phone], she is requesting verbal orders for PT once a week for four weeks.

## 2022-08-21 ENCOUNTER — Telehealth: Payer: Self-pay | Admitting: Family Medicine

## 2022-08-21 ENCOUNTER — Telehealth: Payer: Self-pay | Admitting: Orthopedic Surgery

## 2022-08-21 NOTE — Telephone Encounter (Signed)
I called and lmovm giving verbal Berkley Harvey

## 2022-08-21 NOTE — Telephone Encounter (Signed)
Pt states she was prescribed   oxyCODONE (Oxy IR/ROXICODONE) immediate release tablet 10 mg   after her surgery and the bottle says No Refills.  Pt was asked if she contacted that MD. Pt states she cannot get through.   Pt is wondering if MD could please send a refill of this pain medication to:  [id number] [place], [place] - [address] AT [facility] Phone: [phone]  Fax: [phone]

## 2022-08-21 NOTE — Telephone Encounter (Signed)
Cara called. She is the nurse working in home with patient. She would like verbal orders for home nursing 1x 3wks, her number is [phone]

## 2022-08-22 ENCOUNTER — Telehealth: Payer: Self-pay | Admitting: Orthopedic Surgery

## 2022-08-22 MED ORDER — OXYCODONE HCL 5 MG PO TABS: 5.0000 mg | ORAL_TABLET | ORAL | 0 refills | Status: AC | PRN

## 2022-08-22 NOTE — Telephone Encounter (Signed)
Pt called requesting for refill. Please send oxycodone to pharmacy on file. Pt phone number is [phone]. Please call pt when sent to pharmacy.

## 2022-08-22 NOTE — Addendum Note (Signed)
Addended by: Willia Craze on: 08/22/2022 08:47 AM   Modules accepted: Orders

## 2022-08-25 ENCOUNTER — Other Ambulatory Visit: Payer: Self-pay | Admitting: Family Medicine

## 2022-08-25 ENCOUNTER — Encounter: Payer: Self-pay | Admitting: Pharmacist

## 2022-08-25 NOTE — Progress Notes (Signed)
Patient previously followed by [facility] pharmacist. Per clinical review, no pharmacist appointment needed at this time. Care guide directed to contact patient and cancel appointment and notify pharmacy team of any patient concerns.  

## 2022-08-25 NOTE — Telephone Encounter (Signed)
She was prescribed Oxycodone 5 mg #50 tablets on 08-22-22. Otherwise, she is due for a PMV with [place]

## 2022-08-25 NOTE — Telephone Encounter (Signed)
Prescription Request  08/25/2022  LOV: 05/16/2022  What is the name of the medication or equipment?  ALPRAZolam Prudy Feeler) 1 MG tablet   Have you contacted your pharmacy to request a refill? No   Which pharmacy would you like this sent to?  [id number] - [place], [place] - [address] [place] [address] Phone: [phone] Fax: [phone]     Patient notified that their request is being sent to the clinical staff for review and that they should receive a response within 2 business days.   Please advise at Mobile [phone] (mobile)

## 2022-08-26 MED ORDER — ONDANSETRON 4 MG PO TBDP: ORAL_TABLET | ORAL | 5 refills | Status: DC

## 2022-08-26 NOTE — Telephone Encounter (Signed)
Patient is aware.  Patient will call back and schedule an appointment as soon as she finds out what time her daughter is coming to visit her.

## 2022-08-26 NOTE — Telephone Encounter (Signed)
Done

## 2022-08-26 NOTE — Telephone Encounter (Signed)
Last refill 04/28/22 Last office visit 05/16/22

## 2022-08-26 NOTE — Addendum Note (Signed)
Addended by: Gershon Crane A on: 08/26/2022 05:33 PM   Modules accepted: Orders

## 2022-08-26 NOTE — Addendum Note (Signed)
Addended by: Kern Reap B on: 08/26/2022 01:49 PM   Modules accepted: Orders

## 2022-08-27 DIAGNOSIS — Z4789 Encounter for other orthopedic aftercare: Secondary | ICD-10-CM | POA: Diagnosis not present

## 2022-08-27 DIAGNOSIS — F32A Depression, unspecified: Secondary | ICD-10-CM | POA: Diagnosis not present

## 2022-08-27 DIAGNOSIS — G8929 Other chronic pain: Secondary | ICD-10-CM | POA: Diagnosis not present

## 2022-08-27 DIAGNOSIS — M48061 Spinal stenosis, lumbar region without neurogenic claudication: Secondary | ICD-10-CM | POA: Diagnosis not present

## 2022-08-27 DIAGNOSIS — I10 Essential (primary) hypertension: Secondary | ICD-10-CM | POA: Diagnosis not present

## 2022-08-27 DIAGNOSIS — F419 Anxiety disorder, unspecified: Secondary | ICD-10-CM | POA: Diagnosis not present

## 2022-08-27 DIAGNOSIS — M069 Rheumatoid arthritis, unspecified: Secondary | ICD-10-CM | POA: Diagnosis not present

## 2022-08-27 DIAGNOSIS — B182 Chronic viral hepatitis C: Secondary | ICD-10-CM | POA: Diagnosis not present

## 2022-08-27 DIAGNOSIS — E1140 Type 2 diabetes mellitus with diabetic neuropathy, unspecified: Secondary | ICD-10-CM | POA: Diagnosis not present

## 2022-08-27 MED ORDER — ALPRAZOLAM 1 MG PO TABS: 1.0000 mg | ORAL_TABLET | Freq: Every evening | ORAL | 5 refills | Status: DC | PRN

## 2022-08-27 NOTE — Telephone Encounter (Signed)
Refill sent.

## 2022-08-28 DIAGNOSIS — B182 Chronic viral hepatitis C: Secondary | ICD-10-CM | POA: Diagnosis not present

## 2022-08-28 DIAGNOSIS — G8929 Other chronic pain: Secondary | ICD-10-CM | POA: Diagnosis not present

## 2022-08-28 DIAGNOSIS — M48061 Spinal stenosis, lumbar region without neurogenic claudication: Secondary | ICD-10-CM | POA: Diagnosis not present

## 2022-08-28 DIAGNOSIS — F419 Anxiety disorder, unspecified: Secondary | ICD-10-CM | POA: Diagnosis not present

## 2022-08-28 DIAGNOSIS — E1140 Type 2 diabetes mellitus with diabetic neuropathy, unspecified: Secondary | ICD-10-CM | POA: Diagnosis not present

## 2022-08-28 DIAGNOSIS — F32A Depression, unspecified: Secondary | ICD-10-CM | POA: Diagnosis not present

## 2022-08-28 DIAGNOSIS — I10 Essential (primary) hypertension: Secondary | ICD-10-CM | POA: Diagnosis not present

## 2022-08-28 DIAGNOSIS — M069 Rheumatoid arthritis, unspecified: Secondary | ICD-10-CM | POA: Diagnosis not present

## 2022-08-28 DIAGNOSIS — Z4789 Encounter for other orthopedic aftercare: Secondary | ICD-10-CM | POA: Diagnosis not present

## 2022-08-29 ENCOUNTER — Telehealth (INDEPENDENT_AMBULATORY_CARE_PROVIDER_SITE_OTHER): Payer: Medicare HMO | Admitting: Family Medicine

## 2022-08-29 ENCOUNTER — Encounter: Payer: Self-pay | Admitting: Family Medicine

## 2022-08-29 DIAGNOSIS — F1190 Opioid use, unspecified, uncomplicated: Secondary | ICD-10-CM | POA: Diagnosis not present

## 2022-08-29 DIAGNOSIS — M48062 Spinal stenosis, lumbar region with neurogenic claudication: Secondary | ICD-10-CM | POA: Diagnosis not present

## 2022-08-29 MED ORDER — HYDROCODONE-ACETAMINOPHEN 10-325 MG PO TABS: 1.0000 | ORAL_TABLET | Freq: Four times a day (QID) | ORAL | 0 refills | Status: DC | PRN

## 2022-08-29 NOTE — Progress Notes (Signed)
Subjective:    Patient ID: Wendy [name], female    DOB: 06-17-1945, 77 y.o.   MRN: [id number]  HPI Virtual Visit via Video Note  I connected with the patient on 08/29/22 at  1:45 PM EDT by a video enabled telemedicine application and verified that I am speaking with the correct person using two identifiers.  Location patient: home Location provider:work or home office Persons participating in the virtual visit: patient, provider  I discussed the limitations of evaluation and management by telemedicine and the availability of in person appointments. The patient expressed understanding and agreed to proceed.   HPI: Here for pain management. She had surgery on 07-29-22 per Dr. Willia Craze where the hardware from a previous fusion was removed and she had laminectomies and foraminotomies performed at L1-L4. Apparently the surgery went well, and of course she is still in a fair amount of pain. In addition to the Norco we supply her with. Dr. Christell Constant has been supplying her with Oxycodone 5 mg tablets for breakthrough pain. She picked #30 of these on 08-15-22 and then #50 on 08-22-26. She has begun PT. She will follow up with Dr.Moore on 09-08-22.    ROS: See pertinent positives and negatives per HPI.  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 Zenovia Jordan     Past Surgical History:  Procedure Laterality Date   CATARACT EXTRACTION     COLONOSCOPY  11/23/2019   per Dr.  Christella Hartigan, adenmatous polyp, repeat in 7 yrs   ESOPHAGOGASTRODUODENOSCOPY  06/20/2008   at [facility], clear    LUMBAR LAMINECTOMY     1995   ROTATOR CUFF REPAIR Right    SPINAL FUSION N/A 07/29/2022   Procedure: T10-PELVIS INSTRUMENTATION/ T10-L4 POSTERIOR SPINAL FUSION;  Surgeon: London Sheer, MD;  Location: [facility] OR;  Service: Orthopedics;  Laterality: N/A;   UPPER GASTROINTESTINAL ENDOSCOPY  2010   [facility]     Family History  Problem Relation Age of Onset   Arthritis Other    Diabetes Other    Hypertension Other    Cancer Other        lung   Lung cancer Mother    Colon cancer Neg Hx    Colon polyps Neg Hx    Esophageal cancer Neg Hx    Rectal cancer Neg Hx    Stomach cancer Neg Hx      Current Outpatient Medications:    HYDROcodone-acetaminophen (NORCO) 10-325 MG tablet, Take 1 tablet by mouth every 6 (six) hours as needed for moderate pain., Disp: 120 tablet, Rfl: 0   HYDROcodone-acetaminophen (NORCO) 10-325 MG tablet, Take 1 tablet by mouth every 6 (six) hours as needed for moderate pain., Disp: 120 tablet, Rfl: 0   HYDROcodone-acetaminophen (NORCO) 10-325 MG tablet, Take 1 tablet by mouth every 6 (six) hours  as needed for moderate pain., Disp: 120 tablet, Rfl: 0   Accu-Chek Softclix Lancets lancets, Test once per day and diagnosis code is E 11.9 and dispense for (ACCU-CHEK Soft Touch), Disp: 100 each, Rfl: 12   acetaminophen (TYLENOL) 500 MG tablet, Take 2 tablets (1,000 mg total) by mouth every 8 (eight) hours for 21 days., Disp: 126 tablet, Rfl: 0   ALPRAZolam (XANAX) 1 MG tablet, Take 1 tablet (1 mg total) by mouth at bedtime as needed for anxiety or sleep., Disp: 30 tablet, Rfl: 5   amLODipine (NORVASC) 10 MG tablet, TAKE 1 TABLET BY MOUTH EVERY DAY, Disp: 90 tablet, Rfl: 0   atorvastatin (LIPITOR) 10 MG tablet, Take 1 tablet (10 mg total) by mouth daily., Disp: 90 tablet, Rfl: 3   b complex vitamins tablet, Take 1 tablet by mouth daily., Disp: , Rfl:    Blood  Glucose Monitoring Suppl (ACCU-CHEK GUIDE) w/Device KIT, Use to test sugars daily., Disp: 1 kit, Rfl: 0   Blood Pressure Monitor KIT, Use to check blood pressure., Disp: 1 kit, Rfl: 0   Cholecalciferol (VITAMIN D3) 20 MCG (800 UNIT) TABS, Take 800 Units by mouth daily., Disp: , Rfl:    DULoxetine (CYMBALTA) 30 MG capsule, Take 1 capsule (30 mg total) by mouth daily. (Patient taking differently: Take 30 mg by mouth at bedtime.), Disp: 30 capsule, Rfl: 3   fluticasone (FLONASE) 50 MCG/ACT nasal spray, SPRAY 2 SPRAYS INTO EACH NOSTRIL EVERY DAY (Patient taking differently: Place 1 spray into both nostrils daily as needed for allergies or rhinitis.), Disp: 48 mL, Rfl: 3   furosemide (LASIX) 20 MG tablet, Take 1 tablet (20 mg total) by mouth daily as needed (fluid retention)., Disp: 90 tablet, Rfl: 3   glipiZIDE (GLUCOTROL) 10 MG tablet, Take 0.5 tablets (5 mg total) by mouth 2 (two) times daily before a meal., Disp: 180 tablet, Rfl: 3   glucose blood (ACCU-CHEK GUIDE) test strip, USE TO TEST ONCE DAILY, Disp: 100 strip, Rfl: 1   hydroxychloroquine (PLAQUENIL) 200 MG tablet, Take 1 tablet (200 mg total) by mouth daily., Disp: 90 tablet, Rfl: 3   JANUVIA 100 MG tablet, TAKE 1 TABLET BY MOUTH EVERY DAY, Disp: 90 tablet, Rfl: 0   Lancets (ACCU-CHEK SOFT TOUCH) lancets, Test once per day and diagnosis code is E 11.9 and dispense for Aviva plus, Disp: 100 each, Rfl: 3   levocetirizine (XYZAL) 5 MG tablet, Take 1 tablet (5 mg total) by mouth every evening., Disp: 90 tablet, Rfl: 3   metFORMIN (GLUCOPHAGE) 500 MG tablet, Take 1 tablet (500 mg total) by mouth 2 (two) times daily with a meal., Disp: 180 tablet, Rfl: 3   omeprazole (PRILOSEC) 40 MG capsule, TAKE 1 CAPSULE BY MOUTH EVERY DAY, Disp: 90 capsule, Rfl: 0   ondansetron (ZOFRAN-ODT) 4 MG disintegrating tablet, TAKE 1 TABLET BY MOUTH EVERY 8 HOURS AS NEEDED FOR NAUSEA AND VOMITING, Disp: 60 tablet, Rfl: 5   oxyCODONE (ROXICODONE) 5 MG immediate release  tablet, Take 1-2 tablets (5-10 mg total) by mouth every 4 (four) hours as needed for up to 7 days for severe pain., Disp: 50 tablet, Rfl: 0   polyethylene glycol (MIRALAX / GLYCOLAX) 17 g packet, Take 17 g by mouth daily as needed for mild constipation., Disp: , Rfl:    potassium chloride (KLOR-CON) 10 MEQ tablet, Take 1 tablet (10 mEq total) by mouth daily., Disp: 90 tablet, Rfl: 3   RESTASIS 0.05 % ophthalmic emulsion, Place 1 drop into both  eyes 2 (two) times daily., Disp: , Rfl:    rivaroxaban (XARELTO) 10 MG TABS tablet, Take 1 tablet (10 mg total) by mouth daily., Disp: 30 tablet, Rfl: 0   senna (SENOKOT) 8.6 MG TABS tablet, Take 1 tablet (8.6 mg total) by mouth 2 (two) times daily., Disp: 60 tablet, Rfl: 0  EXAM:  VITALS per patient if applicable:  GENERAL: alert, oriented, appears well and in no acute distress  HEENT: atraumatic, conjunttiva clear, no obvious abnormalities on inspection of external nose and ears  NECK: normal movements of the head and neck  LUNGS: on inspection no signs of respiratory distress, breathing rate appears normal, no obvious gross SOB, gasping or wheezing  CV: no obvious cyanosis  MS: moves all visible extremities without noticeable abnormality  PSYCH/NEURO: pleasant and cooperative, no obvious depression or anxiety, speech and thought processing grossly intact  ASSESSMENT AND PLAN: Pain management. Indication for chronic opioid: low back pain Medication and dose: Norco 10-325 # pills per month: 120 Last UDS date: 09-24-22 Opioid Treatment Agreement signed (Y/N): 06-19-17 Opioid Treatment Agreement last reviewed with patient:  08-29-22 [facility] reviewed this encounter (include red flags): Yes These meds were refilled. She will come to the lab next month for a UDS.  Gershon Crane, MD  Discussed the following assessment and plan:  No diagnosis found.     I discussed the assessment and treatment plan with the patient. The patient was provided an  opportunity to ask questions and all were answered. The patient agreed with the plan and demonstrated an understanding of the instructions.   The patient was advised to call back or seek an in-person evaluation if the symptoms worsen or if the condition fails to improve as anticipated.      Review of Systems     Objective:   Physical Exam        Assessment & Plan:

## 2022-09-02 DIAGNOSIS — E1140 Type 2 diabetes mellitus with diabetic neuropathy, unspecified: Secondary | ICD-10-CM | POA: Diagnosis not present

## 2022-09-02 DIAGNOSIS — M48061 Spinal stenosis, lumbar region without neurogenic claudication: Secondary | ICD-10-CM | POA: Diagnosis not present

## 2022-09-02 DIAGNOSIS — G8929 Other chronic pain: Secondary | ICD-10-CM | POA: Diagnosis not present

## 2022-09-02 DIAGNOSIS — I10 Essential (primary) hypertension: Secondary | ICD-10-CM | POA: Diagnosis not present

## 2022-09-02 DIAGNOSIS — M069 Rheumatoid arthritis, unspecified: Secondary | ICD-10-CM | POA: Diagnosis not present

## 2022-09-02 DIAGNOSIS — F419 Anxiety disorder, unspecified: Secondary | ICD-10-CM | POA: Diagnosis not present

## 2022-09-02 DIAGNOSIS — B182 Chronic viral hepatitis C: Secondary | ICD-10-CM | POA: Diagnosis not present

## 2022-09-02 DIAGNOSIS — Z4789 Encounter for other orthopedic aftercare: Secondary | ICD-10-CM | POA: Diagnosis not present

## 2022-09-02 DIAGNOSIS — F32A Depression, unspecified: Secondary | ICD-10-CM | POA: Diagnosis not present

## 2022-09-05 DIAGNOSIS — M069 Rheumatoid arthritis, unspecified: Secondary | ICD-10-CM | POA: Diagnosis not present

## 2022-09-05 DIAGNOSIS — F32A Depression, unspecified: Secondary | ICD-10-CM | POA: Diagnosis not present

## 2022-09-05 DIAGNOSIS — G8929 Other chronic pain: Secondary | ICD-10-CM | POA: Diagnosis not present

## 2022-09-05 DIAGNOSIS — Z4789 Encounter for other orthopedic aftercare: Secondary | ICD-10-CM | POA: Diagnosis not present

## 2022-09-05 DIAGNOSIS — B182 Chronic viral hepatitis C: Secondary | ICD-10-CM | POA: Diagnosis not present

## 2022-09-05 DIAGNOSIS — F419 Anxiety disorder, unspecified: Secondary | ICD-10-CM | POA: Diagnosis not present

## 2022-09-05 DIAGNOSIS — E1140 Type 2 diabetes mellitus with diabetic neuropathy, unspecified: Secondary | ICD-10-CM | POA: Diagnosis not present

## 2022-09-05 DIAGNOSIS — M48061 Spinal stenosis, lumbar region without neurogenic claudication: Secondary | ICD-10-CM | POA: Diagnosis not present

## 2022-09-05 DIAGNOSIS — I10 Essential (primary) hypertension: Secondary | ICD-10-CM | POA: Diagnosis not present

## 2022-09-08 ENCOUNTER — Other Ambulatory Visit (INDEPENDENT_AMBULATORY_CARE_PROVIDER_SITE_OTHER): Payer: Medicare HMO

## 2022-09-08 ENCOUNTER — Ambulatory Visit (INDEPENDENT_AMBULATORY_CARE_PROVIDER_SITE_OTHER): Payer: Medicare HMO | Admitting: Orthopedic Surgery

## 2022-09-08 DIAGNOSIS — Z981 Arthrodesis status: Secondary | ICD-10-CM

## 2022-09-08 NOTE — Progress Notes (Signed)
Orthopedic Surgery Post-operative Office Visit  Procedure: L2/3 and L3/4 XLIF, L1-4 laminectomies, T10-pelvis PSIF Date of Surgery: 07/29/2022 (~6 weeks post-op)  Assessment: Patient is a 77 y.o. who is doing well after surgery.  Still has numbness and soreness in the left thigh   Plan: -Operative plans complete -Out of bed as tolerated, no brace -No bending/lifting/twisting greater than 10 pounds -Okay to submerge wound -Pain management: Norco per primary care doctor -Will continue to monitor her left thigh numbness and soreness since it has been improving -In regards to her shoulder, provided her with some exercises to work on.  Told her to continue to use the Norco for pain control.  Can use ibuprofen/Tylenol after she runs out of that -If her shoulder is not doing any better at her next visit, will order MRI to evaluate for rotator cuff tear -Return to office in 6 weeks, x-rays needed at next visit: AP/lateral/flex/ex lumbar and right shoulder Grashey/scapular Y/axillary  ___________________________________________________________________________   Subjective: Patient has been doing well since discharge from hospital.  She has been working with physical therapy.  Her preoperative leg pain is better.  She still has some soreness in the left anterior thigh.  She also has decreased sensation in the left anterior thigh.  Feels that this numbness and soreness has ben improving with time. No other leg pain or numbness.  Denies paresthesias.  Is satisfied with the surgical result at this point.  Objective:  General: no acute distress, appropriate affect Neurologic: alert, answering questions appropriately, following commands Respiratory: unlabored breathing on room air Skin: incision appears to be healing as appropriately with no evidence of infection, no drainage  MSK (spine):  -Strength exam      Left  Right  EHL    5/5  5/5 TA    5/5  5/5 GSC    5/5  5/5 Knee extension   5/5  5/5 Hip flexion   4/5  5/5  -Sensory exam    Sensation intact to light touch in L3-S1 nerve distributions of bilateral lower extremities (decreased in left anterior thigh)   Imaging: X-rays of the lumbar spine taken 09/08/2022 was independently reviewed and interpreted, showing instrumentation in place from T10-pelvis. Screws appear in appropriate position. No screws have backed out. There are lateral interbody devices at L2/3 and L3/4. Interbodies appear in appropriate position. PI of 66, LL of 55.   Patient name: Wendy [name] Patient MRN: [id number] Date of visit: 09/08/22

## 2022-09-09 DIAGNOSIS — G8929 Other chronic pain: Secondary | ICD-10-CM | POA: Diagnosis not present

## 2022-09-09 DIAGNOSIS — F419 Anxiety disorder, unspecified: Secondary | ICD-10-CM | POA: Diagnosis not present

## 2022-09-09 DIAGNOSIS — F32A Depression, unspecified: Secondary | ICD-10-CM | POA: Diagnosis not present

## 2022-09-09 DIAGNOSIS — E1140 Type 2 diabetes mellitus with diabetic neuropathy, unspecified: Secondary | ICD-10-CM | POA: Diagnosis not present

## 2022-09-09 DIAGNOSIS — I10 Essential (primary) hypertension: Secondary | ICD-10-CM | POA: Diagnosis not present

## 2022-09-09 DIAGNOSIS — M48061 Spinal stenosis, lumbar region without neurogenic claudication: Secondary | ICD-10-CM | POA: Diagnosis not present

## 2022-09-09 DIAGNOSIS — M069 Rheumatoid arthritis, unspecified: Secondary | ICD-10-CM | POA: Diagnosis not present

## 2022-09-09 DIAGNOSIS — B182 Chronic viral hepatitis C: Secondary | ICD-10-CM | POA: Diagnosis not present

## 2022-09-09 DIAGNOSIS — Z4789 Encounter for other orthopedic aftercare: Secondary | ICD-10-CM | POA: Diagnosis not present

## 2022-09-11 ENCOUNTER — Telehealth: Payer: Self-pay | Admitting: Orthopedic Surgery

## 2022-09-11 DIAGNOSIS — Z4789 Encounter for other orthopedic aftercare: Secondary | ICD-10-CM | POA: Diagnosis not present

## 2022-09-11 DIAGNOSIS — M069 Rheumatoid arthritis, unspecified: Secondary | ICD-10-CM | POA: Diagnosis not present

## 2022-09-11 DIAGNOSIS — F419 Anxiety disorder, unspecified: Secondary | ICD-10-CM | POA: Diagnosis not present

## 2022-09-11 DIAGNOSIS — G8929 Other chronic pain: Secondary | ICD-10-CM | POA: Diagnosis not present

## 2022-09-11 DIAGNOSIS — B182 Chronic viral hepatitis C: Secondary | ICD-10-CM | POA: Diagnosis not present

## 2022-09-11 DIAGNOSIS — I10 Essential (primary) hypertension: Secondary | ICD-10-CM | POA: Diagnosis not present

## 2022-09-11 DIAGNOSIS — E1140 Type 2 diabetes mellitus with diabetic neuropathy, unspecified: Secondary | ICD-10-CM | POA: Diagnosis not present

## 2022-09-11 DIAGNOSIS — F32A Depression, unspecified: Secondary | ICD-10-CM | POA: Diagnosis not present

## 2022-09-11 DIAGNOSIS — M48061 Spinal stenosis, lumbar region without neurogenic claudication: Secondary | ICD-10-CM | POA: Diagnosis not present

## 2022-09-11 MED ORDER — OXYCODONE HCL 5 MG PO TABS: 2.5000 mg | ORAL_TABLET | ORAL | 0 refills | Status: AC | PRN

## 2022-09-11 NOTE — Telephone Encounter (Signed)
Jae Dire (PT) from [facility] called requesting extension PT( verbal orders) for 1wk 5. Please call or no answer leave detailed message on secure line [phone].

## 2022-09-11 NOTE — Telephone Encounter (Signed)
Lvm advising  

## 2022-09-11 NOTE — Telephone Encounter (Signed)
Patient called needing Rx refilled Oxycodone. The number to contact patient is [phone]

## 2022-09-12 NOTE — Telephone Encounter (Signed)
Verbal ok given.

## 2022-09-14 ENCOUNTER — Other Ambulatory Visit: Payer: Self-pay | Admitting: Family Medicine

## 2022-09-14 DIAGNOSIS — K219 Gastro-esophageal reflux disease without esophagitis: Secondary | ICD-10-CM

## 2022-09-17 ENCOUNTER — Telehealth: Payer: Self-pay | Admitting: Orthopedic Surgery

## 2022-09-17 NOTE — Telephone Encounter (Signed)
Vernona Rieger with [facility] is calling to get verbal orders for occupational therapy once a week x 4 weeks for Ms. Mcgriff.  Laura's return call # is [phone]

## 2022-09-18 NOTE — Telephone Encounter (Signed)
Lmom giving verbal Wendy [name]

## 2022-09-19 DIAGNOSIS — I10 Essential (primary) hypertension: Secondary | ICD-10-CM | POA: Diagnosis not present

## 2022-09-19 DIAGNOSIS — F32A Depression, unspecified: Secondary | ICD-10-CM | POA: Diagnosis not present

## 2022-09-19 DIAGNOSIS — G8929 Other chronic pain: Secondary | ICD-10-CM | POA: Diagnosis not present

## 2022-09-19 DIAGNOSIS — B182 Chronic viral hepatitis C: Secondary | ICD-10-CM | POA: Diagnosis not present

## 2022-09-19 DIAGNOSIS — F419 Anxiety disorder, unspecified: Secondary | ICD-10-CM | POA: Diagnosis not present

## 2022-09-19 DIAGNOSIS — M069 Rheumatoid arthritis, unspecified: Secondary | ICD-10-CM | POA: Diagnosis not present

## 2022-09-19 DIAGNOSIS — M48061 Spinal stenosis, lumbar region without neurogenic claudication: Secondary | ICD-10-CM | POA: Diagnosis not present

## 2022-09-19 DIAGNOSIS — Z4789 Encounter for other orthopedic aftercare: Secondary | ICD-10-CM | POA: Diagnosis not present

## 2022-09-19 DIAGNOSIS — E1140 Type 2 diabetes mellitus with diabetic neuropathy, unspecified: Secondary | ICD-10-CM | POA: Diagnosis not present

## 2022-09-23 ENCOUNTER — Telehealth: Payer: Self-pay | Admitting: Radiology

## 2022-09-23 ENCOUNTER — Telehealth: Payer: Self-pay | Admitting: Family Medicine

## 2022-09-23 DIAGNOSIS — Z4789 Encounter for other orthopedic aftercare: Secondary | ICD-10-CM | POA: Diagnosis not present

## 2022-09-23 DIAGNOSIS — B182 Chronic viral hepatitis C: Secondary | ICD-10-CM | POA: Diagnosis not present

## 2022-09-23 DIAGNOSIS — I10 Essential (primary) hypertension: Secondary | ICD-10-CM | POA: Diagnosis not present

## 2022-09-23 DIAGNOSIS — E1140 Type 2 diabetes mellitus with diabetic neuropathy, unspecified: Secondary | ICD-10-CM | POA: Diagnosis not present

## 2022-09-23 DIAGNOSIS — M069 Rheumatoid arthritis, unspecified: Secondary | ICD-10-CM | POA: Diagnosis not present

## 2022-09-23 DIAGNOSIS — F419 Anxiety disorder, unspecified: Secondary | ICD-10-CM | POA: Diagnosis not present

## 2022-09-23 DIAGNOSIS — F32A Depression, unspecified: Secondary | ICD-10-CM | POA: Diagnosis not present

## 2022-09-23 DIAGNOSIS — M48061 Spinal stenosis, lumbar region without neurogenic claudication: Secondary | ICD-10-CM | POA: Diagnosis not present

## 2022-09-23 DIAGNOSIS — G8929 Other chronic pain: Secondary | ICD-10-CM | POA: Diagnosis not present

## 2022-09-23 NOTE — Telephone Encounter (Signed)
Received voicemail from Wendy [name], HHPT with [facility]. Patient is reporting 8/10 pain in her thoracic and lumbar spine. She also has pain from her bilateral thighs to her feet. She currently has rx for Oxycodone 5mg  1/2 -1 tab q4 hours prn per Revonda Standard.  Per chart, it looks like she has Hydrocodone 10/325 from Dr. [name].  CB for Revonda Standard if you need to speak with her is [phone]

## 2022-09-23 NOTE — Telephone Encounter (Signed)
Melchor Amour.,  PTA with [facility] [phone]  Called to report the following:  Pt has a High BP:  160/94 Did not take BP meds (Amlodipine) today  Pt says she was vomiting and not feeling well last few days Pt did take her Metformin and glipizide Pt is complaining of 8 out of 10 pain in legs after surgery and mid back (thoracic area)  Please call Pt directly if you have any advice/concerns

## 2022-09-24 ENCOUNTER — Encounter: Payer: Self-pay | Admitting: Family Medicine

## 2022-09-24 ENCOUNTER — Telehealth: Payer: Self-pay | Admitting: Family Medicine

## 2022-09-24 ENCOUNTER — Ambulatory Visit (INDEPENDENT_AMBULATORY_CARE_PROVIDER_SITE_OTHER): Payer: Medicare HMO | Admitting: Family Medicine

## 2022-09-24 VITALS — BP 152/86 | HR 80 | Temp 98.4°F

## 2022-09-24 DIAGNOSIS — A084 Viral intestinal infection, unspecified: Secondary | ICD-10-CM | POA: Diagnosis not present

## 2022-09-24 DIAGNOSIS — R112 Nausea with vomiting, unspecified: Secondary | ICD-10-CM | POA: Diagnosis not present

## 2022-09-24 MED ORDER — ONDANSETRON 4 MG PO TBDP: ORAL_TABLET | ORAL | 5 refills | Status: DC

## 2022-09-24 MED ORDER — PROMETHAZINE HCL 25 MG PO TABS
25.0000 mg | ORAL_TABLET | Freq: Once | ORAL | Status: AC
Start: 2022-09-24 — End: 2022-09-24
  Administered 2022-09-24: 25 mg via ORAL

## 2022-09-24 NOTE — Addendum Note (Signed)
Addended by: Carola Rhine on: 09/24/2022 12:04 PM   Modules accepted: Orders

## 2022-09-24 NOTE — Telephone Encounter (Signed)
Pt was just seen by MD.  Pt states she is at the pharmacy, and MD forgot to send her Rx to treat a UTI.  Please advise.   [id number] [place], [place] - [address] Phone: [phone]  Fax: [phone]

## 2022-09-24 NOTE — Progress Notes (Signed)
   Subjective:    Patient ID: Wendy [name], female    DOB: 1945-10-10, 77 y.o.   MRN: [id number]  HPI Here for 3 days of nausea and vomiting with some mild central abdominal pains. No fever or diarrhea. She is using Pepto-Bismol and Ondansetron. No one else is sick in her family.    Review of Systems  Constitutional: Negative.   Respiratory: Negative.    Cardiovascular: Negative.   Gastrointestinal:  Positive for abdominal pain, nausea and vomiting. Negative for abdominal distention, blood in stool, constipation and diarrhea.  Genitourinary: Negative.        Objective:   Physical Exam Constitutional:      Appearance: She is ill-appearing.     Comments: In a wheelchair   Cardiovascular:     Rate and Rhythm: Normal rate and regular rhythm.     Pulses: Normal pulses.     Heart sounds: Normal heart sounds.  Pulmonary:     Effort: Pulmonary effort is normal.     Breath sounds: Normal breath sounds.  Abdominal:     General: Abdomen is flat. Bowel sounds are normal. There is no distension.     Palpations: Abdomen is soft. There is no mass.     Tenderness: There is no guarding or rebound.     Hernia: No hernia is present.     Comments: Mildly tender over the umbilicus   Neurological:     Mental Status: She is alert.           Assessment & Plan:  Viral enteritis. She is given a shot of Promethazine. She can use Ondansetron as needed. Drink fluids. This should resolve in the next day or so.  Gershon Crane, MD

## 2022-09-25 ENCOUNTER — Other Ambulatory Visit: Payer: Self-pay

## 2022-09-25 ENCOUNTER — Telehealth: Payer: Self-pay | Admitting: Orthopedic Surgery

## 2022-09-25 MED ORDER — CIPROFLOXACIN HCL 500 MG PO TABS: 500.0000 mg | ORAL_TABLET | Freq: Two times a day (BID) | ORAL | 0 refills | Status: AC

## 2022-09-25 MED ORDER — OXYCODONE HCL 5 MG PO TABS: 5.0000 mg | ORAL_TABLET | ORAL | 0 refills | Status: AC | PRN

## 2022-09-25 NOTE — Telephone Encounter (Signed)
Orthopedic Telephone Call  Spoke with patient this evening.  She has noticed worsening pain in her back going into her legs as she has come off of the oxycodone.  She was doing well at her last office visit and was walking more.  She is still walking more and has transition to a cane but pain is gotten worse recently.  She said it started around the time of her stomach illness for which she has been prescribed ciprofloxacin by her PCP.  She has not noticed any weakness in her legs.  She still has decreased sensation over the lateral aspect of her left thigh.  No new numbness or paresthesias.  No bowel or bladder incontinence.  No saddle anesthesia.  She has not had any falls or any trauma recently.  She has not felt anything pop or snap in her back.  She still feels better than she did before surgery and is able to walk longer distances.  However, she still feels her pain is significant.  She was on Norco prior to surgery. I told her we will do 1 more prescription of the oxycodone to get her through this flare of pain but if pain persists or gets worse, then I would want her to let me know and we will bring her in to get x-rays and reexamine her.  Tyrone Apple Eliette Drumwright,MD Orthopedic Surgeon

## 2022-09-25 NOTE — Telephone Encounter (Signed)
New Rx for Cipro sent to pt pharmacy, pt notified

## 2022-09-25 NOTE — Telephone Encounter (Signed)
Call in Cipro 500 mg BID for 7 days  

## 2022-09-26 DIAGNOSIS — F419 Anxiety disorder, unspecified: Secondary | ICD-10-CM | POA: Diagnosis not present

## 2022-09-26 DIAGNOSIS — G8929 Other chronic pain: Secondary | ICD-10-CM | POA: Diagnosis not present

## 2022-09-26 DIAGNOSIS — Z4789 Encounter for other orthopedic aftercare: Secondary | ICD-10-CM | POA: Diagnosis not present

## 2022-09-26 DIAGNOSIS — I10 Essential (primary) hypertension: Secondary | ICD-10-CM | POA: Diagnosis not present

## 2022-09-26 DIAGNOSIS — M48061 Spinal stenosis, lumbar region without neurogenic claudication: Secondary | ICD-10-CM | POA: Diagnosis not present

## 2022-09-26 DIAGNOSIS — M069 Rheumatoid arthritis, unspecified: Secondary | ICD-10-CM | POA: Diagnosis not present

## 2022-09-26 DIAGNOSIS — B182 Chronic viral hepatitis C: Secondary | ICD-10-CM | POA: Diagnosis not present

## 2022-09-26 DIAGNOSIS — E1140 Type 2 diabetes mellitus with diabetic neuropathy, unspecified: Secondary | ICD-10-CM | POA: Diagnosis not present

## 2022-09-26 DIAGNOSIS — F32A Depression, unspecified: Secondary | ICD-10-CM | POA: Diagnosis not present

## 2022-09-30 DIAGNOSIS — M48061 Spinal stenosis, lumbar region without neurogenic claudication: Secondary | ICD-10-CM | POA: Diagnosis not present

## 2022-09-30 DIAGNOSIS — M069 Rheumatoid arthritis, unspecified: Secondary | ICD-10-CM | POA: Diagnosis not present

## 2022-09-30 DIAGNOSIS — I10 Essential (primary) hypertension: Secondary | ICD-10-CM | POA: Diagnosis not present

## 2022-09-30 DIAGNOSIS — F32A Depression, unspecified: Secondary | ICD-10-CM | POA: Diagnosis not present

## 2022-09-30 DIAGNOSIS — B182 Chronic viral hepatitis C: Secondary | ICD-10-CM | POA: Diagnosis not present

## 2022-09-30 DIAGNOSIS — G8929 Other chronic pain: Secondary | ICD-10-CM | POA: Diagnosis not present

## 2022-09-30 DIAGNOSIS — Z4789 Encounter for other orthopedic aftercare: Secondary | ICD-10-CM | POA: Diagnosis not present

## 2022-09-30 DIAGNOSIS — E1140 Type 2 diabetes mellitus with diabetic neuropathy, unspecified: Secondary | ICD-10-CM | POA: Diagnosis not present

## 2022-09-30 DIAGNOSIS — F419 Anxiety disorder, unspecified: Secondary | ICD-10-CM | POA: Diagnosis not present

## 2022-10-01 DIAGNOSIS — F419 Anxiety disorder, unspecified: Secondary | ICD-10-CM | POA: Diagnosis not present

## 2022-10-01 DIAGNOSIS — M069 Rheumatoid arthritis, unspecified: Secondary | ICD-10-CM | POA: Diagnosis not present

## 2022-10-01 DIAGNOSIS — E1140 Type 2 diabetes mellitus with diabetic neuropathy, unspecified: Secondary | ICD-10-CM | POA: Diagnosis not present

## 2022-10-01 DIAGNOSIS — I10 Essential (primary) hypertension: Secondary | ICD-10-CM | POA: Diagnosis not present

## 2022-10-01 DIAGNOSIS — G8929 Other chronic pain: Secondary | ICD-10-CM | POA: Diagnosis not present

## 2022-10-01 DIAGNOSIS — M48061 Spinal stenosis, lumbar region without neurogenic claudication: Secondary | ICD-10-CM | POA: Diagnosis not present

## 2022-10-01 DIAGNOSIS — B182 Chronic viral hepatitis C: Secondary | ICD-10-CM | POA: Diagnosis not present

## 2022-10-01 DIAGNOSIS — Z4789 Encounter for other orthopedic aftercare: Secondary | ICD-10-CM | POA: Diagnosis not present

## 2022-10-01 DIAGNOSIS — F32A Depression, unspecified: Secondary | ICD-10-CM | POA: Diagnosis not present

## 2022-10-07 ENCOUNTER — Ambulatory Visit (INDEPENDENT_AMBULATORY_CARE_PROVIDER_SITE_OTHER): Payer: Medicare HMO | Admitting: Family Medicine

## 2022-10-07 ENCOUNTER — Encounter: Payer: Self-pay | Admitting: Family Medicine

## 2022-10-07 VITALS — BP 118/78 | HR 78 | Temp 98.4°F | Wt 180.0 lb

## 2022-10-07 DIAGNOSIS — L0292 Furuncle, unspecified: Secondary | ICD-10-CM

## 2022-10-07 MED ORDER — MUPIROCIN 2 % EX OINT: 1.0000 | TOPICAL_OINTMENT | Freq: Two times a day (BID) | CUTANEOUS | 0 refills | Status: DC

## 2022-10-07 MED ORDER — DOXYCYCLINE HYCLATE 100 MG PO CAPS: 100.0000 mg | ORAL_CAPSULE | Freq: Two times a day (BID) | ORAL | 0 refills | Status: AC

## 2022-10-07 NOTE — Progress Notes (Signed)
   Subjective:    Patient ID: Wendy [name], female    DOB: April 10, 1945, 77 y.o.   MRN: [id number]  HPI Here for the appearance 2 days ago of a tender lump on her face. She has never had this before.    Review of Systems  Constitutional: Negative.   Respiratory: Negative.    Cardiovascular: Negative.        Objective:   Physical Exam Constitutional:      Appearance: Normal appearance.  Cardiovascular:     Rate and Rhythm: Normal rate and regular rhythm.     Pulses: Normal pulses.     Heart sounds: Normal heart sounds.  Pulmonary:     Effort: Pulmonary effort is normal.     Breath sounds: Normal breath sounds.  Skin:    Comments: There is a small tender boil on the left side of her face beneath the nose  Neurological:     Mental Status: She is alert.           Assessment & Plan:  Boil, treat with Doxycycline 100 mg BID for 10 days and topical Mupiricin ointment BID. Gershon Crane, MD

## 2022-10-08 ENCOUNTER — Inpatient Hospital Stay: Admission: RE | Admit: 2022-10-08 | Payer: Medicare HMO | Source: Ambulatory Visit

## 2022-10-10 DIAGNOSIS — E1140 Type 2 diabetes mellitus with diabetic neuropathy, unspecified: Secondary | ICD-10-CM | POA: Diagnosis not present

## 2022-10-10 DIAGNOSIS — I10 Essential (primary) hypertension: Secondary | ICD-10-CM | POA: Diagnosis not present

## 2022-10-10 DIAGNOSIS — G8929 Other chronic pain: Secondary | ICD-10-CM | POA: Diagnosis not present

## 2022-10-10 DIAGNOSIS — F32A Depression, unspecified: Secondary | ICD-10-CM | POA: Diagnosis not present

## 2022-10-10 DIAGNOSIS — M48061 Spinal stenosis, lumbar region without neurogenic claudication: Secondary | ICD-10-CM | POA: Diagnosis not present

## 2022-10-10 DIAGNOSIS — F419 Anxiety disorder, unspecified: Secondary | ICD-10-CM | POA: Diagnosis not present

## 2022-10-10 DIAGNOSIS — M069 Rheumatoid arthritis, unspecified: Secondary | ICD-10-CM | POA: Diagnosis not present

## 2022-10-10 DIAGNOSIS — Z4789 Encounter for other orthopedic aftercare: Secondary | ICD-10-CM | POA: Diagnosis not present

## 2022-10-10 DIAGNOSIS — B182 Chronic viral hepatitis C: Secondary | ICD-10-CM | POA: Diagnosis not present

## 2022-10-13 ENCOUNTER — Other Ambulatory Visit (INDEPENDENT_AMBULATORY_CARE_PROVIDER_SITE_OTHER): Payer: Medicare HMO

## 2022-10-13 ENCOUNTER — Ambulatory Visit: Payer: Medicare HMO | Admitting: Orthopedic Surgery

## 2022-10-13 DIAGNOSIS — Z981 Arthrodesis status: Secondary | ICD-10-CM

## 2022-10-13 MED ORDER — PROMETHAZINE HCL 25 MG/ML IJ SOLN
25.0000 mg | Freq: Once | INTRAMUSCULAR | Status: DC
Start: 2022-10-13 — End: 2022-10-13

## 2022-10-13 MED ORDER — PROMETHAZINE HCL 25 MG PO TABS
25.0000 mg | ORAL_TABLET | Freq: Once | ORAL | Status: DC
Start: 2022-10-13 — End: 2022-10-13

## 2022-10-13 MED ORDER — PROMETHAZINE HCL 25 MG/ML IJ SOLN
25.0000 mg | Freq: Once | INTRAMUSCULAR | Status: AC
Start: 2022-10-13 — End: 2022-10-13
  Administered 2022-10-13: 25 mg via INTRAMUSCULAR

## 2022-10-13 NOTE — Addendum Note (Signed)
Addended by: Carola Rhine on: 10/13/2022 12:01 PM   Modules accepted: Orders

## 2022-10-13 NOTE — Addendum Note (Signed)
Addended by: Carola Rhine on: 10/13/2022 11:05 AM   Modules accepted: Orders

## 2022-10-13 NOTE — Progress Notes (Signed)
Orthopedic Surgery Office Visit   Procedure: L2/3 and L3/4 XLIF, L1-4 laminectomies, T10-pelvis PSIF Date of Surgery: 07/29/2022 (~10 weeks post-op)   Assessment: Patient is a 77 y.o. who is doing better than pre-op. Still having some back and leg pain but it has improved since surgery     Plan: -Operative plans complete -Out of bed as tolerated, no brace -No spine specific restrictions -Pain management: Norco per primary care doctor -Return to office in 3 months, x-rays needed at next visit: AP/lateral/flex/ex lumbar   ___________________________________________________________________________     Subjective: Patient had a few weeks where her leg and back pain seemed to be worse.  However, now she reports that her back and leg pain is improved from where it was before surgery. She has now transition from ambulating with a walker to a cane.  She is living independently. She is happy with her surgical outcome. Today, her bigger concern is an outbreak that she has had on her face for which her PCP has prescribed doxycycline and mupirocin cream.    Objective:   General: no acute distress, appropriate affect Neurologic: alert, answering questions appropriately, following commands Respiratory: unlabored breathing on room air Skin: incision appears to be healing as appropriately with no evidence of infection, no drainage   MSK (spine):   -Strength exam                                                   Left                  Right   EHL                              5/5                  5/5 TA                                 5/5                  5/5 GSC                             5/5                  5/5 Knee extension            5/5                  5/5 Hip flexion                    4/5                  5/5   -Sensory exam                           Sensation intact to light touch in L3-S1 nerve distributions of bilateral lower extremities (decreased in left anterior thigh)     Imaging: X-rays of the lumbar spine taken 10/13/2022 were independently reviewed and interpreted, showing instrumentation in place from T10-pelvis. Screws appear in appropriate position. No screws have backed out. There  are lateral interbody devices at L2/3 and L3/4. Interbodies appear in appropriate and similar position to prior films on 09/08/2022.  XRs of the thoracic spine taken 10/13/2022 were dependently reviewed and interpreted, showing maintained disc height above her thoracolumbar fusion.  No fracture or dislocation seen.  No focal kyphosis above her fusion seen.     Patient name: Wendy [name] Patient MRN: [id number] Date of visit: 10/13/22

## 2022-10-14 DIAGNOSIS — F32A Depression, unspecified: Secondary | ICD-10-CM | POA: Diagnosis not present

## 2022-10-14 DIAGNOSIS — Z4789 Encounter for other orthopedic aftercare: Secondary | ICD-10-CM | POA: Diagnosis not present

## 2022-10-14 DIAGNOSIS — M069 Rheumatoid arthritis, unspecified: Secondary | ICD-10-CM | POA: Diagnosis not present

## 2022-10-14 DIAGNOSIS — M48061 Spinal stenosis, lumbar region without neurogenic claudication: Secondary | ICD-10-CM | POA: Diagnosis not present

## 2022-10-14 DIAGNOSIS — E1140 Type 2 diabetes mellitus with diabetic neuropathy, unspecified: Secondary | ICD-10-CM | POA: Diagnosis not present

## 2022-10-14 DIAGNOSIS — G8929 Other chronic pain: Secondary | ICD-10-CM | POA: Diagnosis not present

## 2022-10-14 DIAGNOSIS — F419 Anxiety disorder, unspecified: Secondary | ICD-10-CM | POA: Diagnosis not present

## 2022-10-14 DIAGNOSIS — B182 Chronic viral hepatitis C: Secondary | ICD-10-CM | POA: Diagnosis not present

## 2022-10-14 DIAGNOSIS — I10 Essential (primary) hypertension: Secondary | ICD-10-CM | POA: Diagnosis not present

## 2022-10-16 ENCOUNTER — Other Ambulatory Visit: Payer: Self-pay | Admitting: Family Medicine

## 2022-11-05 ENCOUNTER — Telehealth: Payer: Self-pay | Admitting: Orthopedic Surgery

## 2022-11-05 NOTE — Telephone Encounter (Signed)
Pt called requesting a call. She is asking if is it for her to exercise and use exercise bike 3 times a week. Please call pt at

## 2022-11-06 NOTE — Telephone Encounter (Signed)
I called and advised patient of Dr. Kathi Der message and she states that she understands

## 2022-11-09 ENCOUNTER — Other Ambulatory Visit: Payer: Self-pay | Admitting: Family Medicine

## 2022-11-20 ENCOUNTER — Telehealth: Payer: Self-pay | Admitting: Orthopedic Surgery

## 2022-11-20 NOTE — Telephone Encounter (Signed)
Received call from patient requesting xrays from 09/08/22 & 10/13/22 on CD. Please call when ready [phone]. Place note for authorization to be signed. Thank you

## 2022-11-20 NOTE — Telephone Encounter (Signed)
Left voicemail advising patient CD was ready for pickup ?

## 2022-12-01 ENCOUNTER — Ambulatory Visit: Payer: Medicare HMO | Admitting: Family Medicine

## 2022-12-02 ENCOUNTER — Ambulatory Visit: Payer: Medicare HMO | Admitting: Family Medicine

## 2022-12-02 ENCOUNTER — Encounter: Payer: Self-pay | Admitting: Family Medicine

## 2022-12-02 VITALS — Ht 63.0 in | Wt 178.0 lb

## 2022-12-02 DIAGNOSIS — Z0000 Encounter for general adult medical examination without abnormal findings: Secondary | ICD-10-CM | POA: Diagnosis not present

## 2022-12-02 DIAGNOSIS — E1140 Type 2 diabetes mellitus with diabetic neuropathy, unspecified: Secondary | ICD-10-CM

## 2022-12-02 NOTE — Progress Notes (Signed)
 Per patient no change in vitals since last visit, unable to obtain new vitals due to telehealth visit

## 2022-12-02 NOTE — Patient Instructions (Addendum)
I really enjoyed getting to talk with you today! I am available on Tuesdays and Thursdays for virtual visits if you have any questions or concerns, or if I can be of any further assistance.   CHECKLIST FROM ANNUAL WELLNESS VISIT:  -Follow up (please call to schedule if not scheduled after visit):   -yearly for annual wellness visit with primary care office  Here is a list of your preventive care/health maintenance measures and the plan for each if any are due:  PLAN For any measures below that may be due:  -can get diabetic kidney exam and foot exam next time in office -please schedule eye exam -if you do any vaccines at the pharmacy please let us know so that we can update your record  Health Maintenance  Topic Date Due   Diabetic kidney evaluation - Urine ACR  10/20/2014   FOOT EXAM  10/27/2014   OPHTHALMOLOGY EXAM  05/21/2016   COVID-19 Vaccine (4 - 2023-24 season) 12/18/2022 (Originally 11/02/2022)   INFLUENZA VACCINE  06/01/2023 (Originally 10/02/2022)   HEMOGLOBIN A1C  01/18/2023   Diabetic kidney evaluation - eGFR measurement  08/03/2023   Medicare Annual Wellness (AWV)  12/02/2023   Colonoscopy  11/23/2026   DTaP/Tdap/Td (2 - Td or Tdap) 03/07/2031   Pneumonia Vaccine 80+ Years old  Completed   DEXA SCAN  Completed   Hepatitis C Screening  Completed   Zoster Vaccines- Shingrix  Completed   HPV VACCINES  Aged Out    -See a dentist at least yearly  -Get your eyes checked and then per your eye specialist's recommendations  -Other issues addressed today:   -I have included below further information regarding a healthy whole foods based diet, physical activity guidelines for adults, stress management and opportunities for social connections. I hope you find this information useful.    -----------------------------------------------------------------------------------------------------------------------------------------------------------------------------------------------------------------------------------------------------------  NUTRITION: -eat real food: lots of colorful vegetables (half the plate) and fruits -5-7 servings of vegetables and fruits per day (fresh or steamed is best), exp. 2 servings of vegetables with lunch and dinner and 2 servings of fruit per day. Berries and greens such as kale and collards are great choices.  -consume on a regular basis: whole grains (make sure first ingredient on label contains the word "whole"), fresh fruits, fish, nuts, seeds, healthy oils (such as olive oil, avocado oil, grape seed oil) -may eat small amounts of dairy and lean meat on occasion, but avoid processed meats such as ham, bacon, lunch meat, etc. -drink water -try to avoid fast food and pre-packaged foods, processed meat -most experts advise limiting sodium to < 2300mg  per day, should limit further is any chronic conditions such as high blood pressure, heart disease, diabetes, etc. The American Heart Association advised that < 1500mg  is is ideal -try to avoid foods that contain any ingredients with names you do not recognize  -try to avoid sugar/sweets (except for the natural sugar that occurs in fresh fruit) -try to avoid sweet drinks -try to avoid white rice, white bread, pasta (unless whole grain), white or yellow potatoes  EXERCISE GUIDELINES FOR ADULTS: -if you wish to increase your physical activity, do so gradually and with the approval of your doctor -STOP and seek medical care immediately if you have any chest pain, chest discomfort or trouble breathing when starting or increasing exercise  -move and stretch your body, legs, feet and arms when sitting for long periods -Physical activity guidelines for optimal health in adults: -least 150 minutes per week of  aerobic exercise (can talk, but not sing) once approved by your doctor, 20-30 minutes of sustained activity or two 10 minute episodes of sustained activity every day.  -resistance training at least 2 days per week if approved by your doctor -balance exercises 3+ days per week:   Stand somewhere where you have something sturdy to hold onto if you lose balance.    1) lift up on toes, start with 5x per day and work up to 20x   2) stand and lift on leg straight out to the side so that foot is a few inches of the floor, start with 5x each side and work up to 20x each side   3) stand on one foot, start with 5 seconds each side and work up to 20 seconds on each side  If you need ideas or help with getting more active:  -Silver sneakers https://tools.silversneakers.com  -Walk with a Doc: http://www.duncan-williams.com/  -try to include resistance (weight lifting/strength building) and balance exercises twice per week: or the following link for ideas: http://castillo-powell.com/  BuyDucts.dk  STRESS MANAGEMENT: -can try meditating, or just sitting quietly with deep breathing while intentionally relaxing all parts of your body for 5 minutes daily -if you need further help with stress, anxiety or depression please follow up with your primary doctor or contact the wonderful folks at [facility]: [phone]  SOCIAL CONNECTIONS: -options in [place] if you wish to engage in more social and exercise related activities:  -Silver sneakers https://tools.silversneakers.com  -Walk with a Doc: http://www.duncan-williams.[email or web address]/  -Check out the [facility] Active Adults 50+ section on the [place] [employer] (hiking clubs, book clubs, cards and games, chess, exercise classes, aquatic classes and much more) - see the website for  details: https://www.[place].gov/departments/parks-recreation/active-adults50  -YouTube has lots of exercise videos for different ages and abilities as well  -[facility] (a variety of indoor and outdoor inperson activities for adults). [phone]. [address]  -Virtual Online Classes (a variety of topics): see seniorplanet.org or call [phone]  -consider volunteering at a school, hospice center, church, senior center or elsewhere   ADVANCED HEALTHCARE DIRECTIVES:  [place] Advanced Directives assistance:   ExpressWeek.com.cy  Everyone should have advanced health care directives in place. This is so that you get the care you want, should you ever be in a situation where you are unable to make your own medical decisions.   From the [place] Advanced Directive Website: "Advance Health Care Directives are legal documents in which you give written instructions about your health care if, in the future, you cannot speak for yourself.   A health care power of attorney allows you to name a person you trust to make your health care decisions if you cannot make them yourself. A declaration of a desire for a natural death (or living will) is document, which states that you desire not to have your life prolonged by extraordinary measures if you have a terminal or incurable illness or if you are in a vegetative state. An advance instruction for mental health treatment makes a declaration of instructions, information and preferences regarding your mental health treatment. It also states that you are aware that the advance instruction authorizes a mental health treatment provider to act according to your wishes. It may also outline your consent or refusal of mental health treatment. A declaration of an anatomical gift allows anyone over the age of 28 to make a gift by will, organ donor card or other document."   Please see  the following website or an elder law attorney for forms, FAQs and for completion of advanced directives: [place] TEFL teacher Health Care Directives Advance Health Care Directives (http://guzman.com/)  Or copy and paste the following to your web browser: PoshChat.fi

## 2022-12-02 NOTE — Progress Notes (Signed)
PATIENT CHECK-IN and HEALTH RISK ASSESSMENT QUESTIONNAIRE:  -completed by phone/video for upcoming Medicare Preventive Visit  Pre-Visit Check-in: 1)Vitals (height, wt, BP, etc) - record in vitals section for visit on day of visit Request home vitals (wt, BP, etc.) and enter into vitals, THEN update Vital Signs SmartPhrase below at the top of the HPI. See below.  2)Review and Update Medications, Allergies PMH, Surgeries, Social history in Epic 3)Hospitalizations in the last year with date/reason? Yes 07/29/22 back surgery 4)Review and Update Care Team (patient's specialists) in Epic 5) Complete PHQ9 in Epic  6) Complete Fall Screening in Epic 7)Review all Health Maintenance Due and order under PCP if not done.  8)Medicare Wellness Questionnaire: Answer theses question about your habits: Do you drink alcohol? yes If yes, how many drinks do you have a day? 1 Have you ever smoked?yes  Quit date if applicable? current  How many packs a day do/did you smoke? 1 cigar Do you use smokeless tobacco? no Do you use an illicit drugs? no Do you exercises? Yes IF so, what type and how many days/minutes per week?walking 15 - 20 minutes a day, doing some exercises that PT gave her Are you sexually active? Sort off Number of partners? 1 Typical breakfast: grits eggs sausage cereal banana sandwich  Typical lunch: meatloaf bologna and cheese salad Typical dinner: pork chop burger chicken rice Typical snacks: crackers  Beverages: cool aid  Answer theses question about you: Can you perform most household chores?not too many  Do you find it hard to follow a conversation in a noisy room?no Do you often ask people to speak up or repeat themselves?no Do you feel that you have a problem with memory?a little foggy  Do you balance your checkbook and or bank acounts? yes Do you feel safe at home? yes Last dentist visit? Not sure  Do you need assistance with any of the following: Please note if so   Driving?  no  Feeding yourself? no  Getting from bed to chair? no  Getting to the toilet?no  Bathing or showering?yes - mate  Dressing yourself? Yes   Managing money?no  Climbing a flight of stairs? yes  Preparing meals? yes  Do you have Advanced Directives in place (Living Will, Healthcare Power or Attorney)? no   Last eye Exam and location? Last year Wendy Orson Slick   Do you currently use prescribed or non-prescribed narcotic or opioid pain medications? Yes, reports is cutting back some. And usually only taking at night, and sometimes skips.   Do you have a history or close family history of breast, ovarian, tubal or peritoneal cancer or a family member with BRCA (breast cancer susceptibility 1 and 2) gene mutations? no  Request home vitals (wt, BP, etc.) and enter into vitals, THEN update Vital Signs SmartPhrase below at the top of the HPI. See below.   Nurse/Assistant Credentials/time stamp:  Wendy [name] CMA ----------------------------------------------------------------------------------------------------------------------------------------------------------------------------------------------------------------------  Because this visit was a virtual/telehealth visit, some criteria may be missing or patient reported. Any vitals not documented were not able to be obtained and vitals that have been documented are patient reported.    MEDICARE ANNUAL PREVENTIVE VISIT WITH PROVIDER: (Welcome to Medicare, initial annual wellness or annual wellness exam)  Virtual Visit via Phone Note  I connected with Wendy [name] on 12/02/22 by phone and verified that I am speaking with the correct person using two identifiers.  Location patient: home Location provider:work or home office Persons participating in the virtual visit: patient, provider  Concerns and/or follow up today: no concerns.    See HM section in Epic for other details of completed HM.    ROS: negative for report of fevers,  unintentional weight loss, vision changes, vision loss, hearing loss or change, chest pain, sob, hemoptysis, melena, hematochezia, hematuria, falls, bleeding or bruising, thoughts of suicide or self harm, memory loss  Patient-completed extensive health risk assessment - reviewed and discussed with the patient: See Health Risk Assessment completed with patient prior to the visit either above or in recent phone note. This was reviewed in detailed with the patient today and appropriate recommendations, orders and referrals were placed as needed per Summary below and patient instructions.   Review of Medical History: -PMH, PSH, Family History and current specialty and care providers reviewed and updated and listed below   Patient Care Team: Wendy Salisbury, MD as PCP - General Wendy [name], Wendy [name], [facility] (Inactive) as Pharmacist (Pharmacist)   Past Medical History:  Diagnosis Date   Allergy    year around   Anemia    long time ago    Anxiety    Arthritis    RA    Cataract    removed  both Wendy [name]-    DEPENDENT EDEMA 07/21/2007   DIABETES MELLITUS, TYPE II 11/05/2006   FOOT PAIN 05/23/2009   GERD 11/20/2006   HEPATITIS C 02/08/2007   sees GI @ [facility]   Hyperlipidemia    HYPERTENSION 11/05/2006   LOW BACK PAIN SYNDROME 07/21/2007   Neuromuscular disorder (HCC)    neuropathy legs, feet    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 Christella [name], adenmatous polyp, repeat in 7 yrs   ESOPHAGOGASTRODUODENOSCOPY  06/20/2008   at Wendy [name], clear    LUMBAR LAMINECTOMY     1995   ROTATOR CUFF REPAIR Right    SPINAL FUSION N/A 07/29/2022   Procedure: T10-PELVIS INSTRUMENTATION/ T10-L4 POSTERIOR SPINAL FUSION;  Surgeon: Wendy Sheer, MD;  Location: [facility] OR;  Service: Orthopedics;  Laterality: N/A;   UPPER  GASTROINTESTINAL ENDOSCOPY  2010   [facility]     Social History   Socioeconomic History   Marital status: Widowed    Spouse name: Not on file   Number of children: 2   Years of education: Not on file   Highest education level: Not on file  Occupational History   Not on file  Tobacco Use   Smoking status: Former    Current packs/day: 0.00    Average packs/day: (1.4 ttl pk-yrs)    Types: Cigarettes, Cigars    Start date: 04/19/1977    Quit date: 04/19/2022    Years since quitting: 0.6   Smokeless tobacco: Never   Tobacco comments:    1 black and mild every 2-3 days- no cigs - doesnt smoke the whole thing (quit smoking cigarettes around 2019)  Vaping Use   Vaping status: Never Used  Substance and Sexual Activity   Alcohol use: Yes    Comment: maybe one drink on special occasions   Drug use: Not Currently   Sexual activity: Not on file  Other Topics Concern   Not on file  Social History Narrative   ** Merged History Encounter **       Social Determinants of Corporate investment banker  Strain: Low Risk  (01/16/2022)   Overall Financial Resource Strain (CARDIA)    Difficulty of Paying Living Expenses: Not very hard  Food Insecurity: No Food Insecurity (11/29/2021)   Hunger Vital Sign    Worried About Running Out of Food in the Last Year: Never true    Ran Out of Food in the Last Year: Never true  Transportation Needs: No Transportation Needs (11/29/2021)   PRAPARE - Administrator, Civil Service (Medical): No    Lack of Transportation (Non-Medical): No  Physical Activity: Insufficiently Active (11/29/2021)   Exercise Vital Sign    Days of Exercise per Week: 7 days    Minutes of Exercise per Session: 20 min  Stress: No Stress Concern Present (11/29/2021)   [employer] of Occupational Health - Occupational Stress Questionnaire    Feeling of Stress : Not at all  Social Connections: Unknown (01/17/2022)   Received from [facility], [facility]   Social  Network    Social Network: Not on file  Intimate Partner Violence: Unknown (01/17/2022)   Received from [facility], [facility]   HITS    Physically Hurt: Not on file    Insult or Talk Down To: Not on file    Threaten Physical Harm: Not on file    Scream or Curse: Not on file    Family History  Problem Relation Age of Onset   Arthritis Other    Diabetes Other    Hypertension Other    Cancer Other        lung   Lung cancer Mother    Colon cancer Neg Hx    Colon polyps Neg Hx    Esophageal cancer Neg Hx    Rectal cancer Neg Hx    Stomach cancer Neg Hx     Current Outpatient Medications on File Prior to Visit  Medication Sig Dispense Refill   Accu-Chek Softclix Lancets lancets Test once per day and diagnosis code is E 11.9 and dispense for (ACCU-CHEK Soft Touch) 100 each 12   ALPRAZolam (XANAX) 1 MG tablet Take 1 tablet (1 mg total) by mouth at bedtime as needed for anxiety or sleep. 30 tablet 5   amLODipine (NORVASC) 10 MG tablet TAKE 1 TABLET BY MOUTH EVERY DAY 90 tablet 0   atorvastatin (LIPITOR) 10 MG tablet Take 1 tablet (10 mg total) by mouth daily. 90 tablet 3   b complex vitamins tablet Take 1 tablet by mouth daily.     Blood Glucose Monitoring Suppl (ACCU-CHEK GUIDE) w/Device KIT Use to test sugars daily. 1 kit 0   Blood Pressure Monitor KIT Use to check blood pressure. 1 kit 0   Cholecalciferol (VITAMIN D3) 20 MCG (800 UNIT) TABS Take 800 Units by mouth daily.     DULoxetine (CYMBALTA) 30 MG capsule Take 1 capsule (30 mg total) by mouth daily. (Patient taking differently: Take 30 mg by mouth at bedtime.) 30 capsule 3   fluticasone (FLONASE) 50 MCG/ACT nasal spray SPRAY 2 SPRAYS INTO EACH NOSTRIL EVERY DAY (Patient taking differently: Place 1 spray into both nostrils daily as needed for allergies or rhinitis.) 48 mL 3   furosemide (LASIX) 20 MG tablet Take 1 tablet (20 mg total) by mouth daily as needed (fluid retention). 90 tablet 3   glipiZIDE (GLUCOTROL) 10 MG  tablet Take 0.5 tablets (5 mg total) by mouth 2 (two) times daily before a meal. 180 tablet 3   glucose blood (ACCU-CHEK GUIDE) test strip USE TO TEST  ONCE DAILY 100 strip 1   HYDROcodone-acetaminophen (NORCO) 10-325 MG tablet Take 1 tablet by mouth every 6 (six) hours as needed for moderate pain. 120 tablet 0   HYDROcodone-acetaminophen (NORCO) 10-325 MG tablet Take 1 tablet by mouth every 6 (six) hours as needed for moderate pain. 120 tablet 0   HYDROcodone-acetaminophen (NORCO) 10-325 MG tablet Take 1 tablet by mouth every 6 (six) hours as needed for moderate pain. 120 tablet 0   hydroxychloroquine (PLAQUENIL) 200 MG tablet TAKE 1 TABLET BY MOUTH EVERY DAY 90 tablet 1   JANUVIA 100 MG tablet TAKE 1 TABLET BY MOUTH EVERY DAY 90 tablet 0   Lancets (ACCU-CHEK SOFT TOUCH) lancets Test once per day and diagnosis code is E 11.9 and dispense for Aviva plus 100 each 3   levocetirizine (XYZAL) 5 MG tablet Take 1 tablet (5 mg total) by mouth every evening. 90 tablet 3   metFORMIN (GLUCOPHAGE) 500 MG tablet Take 1 tablet (500 mg total) by mouth 2 (two) times daily with a meal. 180 tablet 3   mupirocin ointment (BACTROBAN) 2 % Apply 1 Application topically 2 (two) times daily. 22 g 0   omeprazole (PRILOSEC) 40 MG capsule TAKE 1 CAPSULE BY MOUTH EVERY DAY 90 capsule 0   ondansetron (ZOFRAN-ODT) 4 MG disintegrating tablet TAKE 1 TABLET BY MOUTH EVERY 8 HOURS AS NEEDED FOR NAUSEA AND VOMITING 60 tablet 5   polyethylene glycol (MIRALAX / GLYCOLAX) 17 g packet Take 17 g by mouth daily as needed for mild constipation.     potassium chloride (KLOR-CON) 10 MEQ tablet Take 1 tablet (10 mEq total) by mouth daily. 90 tablet 3   RESTASIS 0.05 % ophthalmic emulsion Place 1 drop into both eyes 2 (two) times daily.     rivaroxaban (XARELTO) 10 MG TABS tablet Take 1 tablet (10 mg total) by mouth daily. 30 tablet 0   No current facility-administered medications on file prior to visit.    Allergies  Allergen Reactions    Aspirin Nausea And Vomiting   Tramadol Nausea And Vomiting       Physical Exam Vitals requested from patient and listed below if patient had equipment and was able to obtain at home for this virtual visit: There were no vitals filed for this visit. Estimated body mass index is 31.53 kg/m as calculated from the following:   Height as of this encounter: 5\' 3"  (1.6 m).   Weight as of this encounter: 178 lb (80.7 kg).  EKG (optional): deferred due to virtual visit  GENERAL: alert, oriented, no acute distress detected, full vision exam deferred due to pandemic and/or virtual encounter  PSYCH/NEURO: pleasant and cooperative, no obvious depression or anxiety, speech and thought processing grossly intact, Cognitive function grossly intact  Flowsheet Row Office Visit from 12/02/2022 in [facility]  PHQ-9 Total Score 7           12/02/2022    2:53 PM 03/17/2022    1:49 PM 02/14/2022    2:52 PM 12/16/2021    2:07 PM 11/29/2021    1:18 PM  Depression screen PHQ 2/9  Decreased Interest 0 0 0 3 0  Down, Depressed, Hopeless 3 2 0 1 0  PHQ - 2 Score 3 2 0 4 0  Altered sleeping 0 1 0 2 0  Tired, decreased energy 3 3 3 3  0  Change in appetite 0 1  0 0  Feeling bad or failure about yourself  1 1  0  0  Trouble concentrating 0 2  0 0  Moving slowly or fidgety/restless 0 0  0 0  Suicidal thoughts 0 0  0 0  PHQ-9 Score 7 10 3 9  0  Difficult doing work/chores  Extremely dIfficult  Not difficult at all Not difficult at all  She has been a little stressed being out of work for 1 year after surgery. She feels is just related to this. She declines therapy or help as feels is coping ok.      11/29/2021    1:20 PM 12/12/2021    5:33 PM 12/16/2021    2:07 PM 01/01/2022    6:04 PM 12/02/2022    2:53 PM  Fall Risk  Falls in the past year? 0  1  0  Was there an injury with Fall? 0  0  0  Fall Risk Category Calculator 0  1  0  Fall Risk Category (Retired) Low  Low     (RETIRED) Patient Fall Risk Level Low fall risk Low fall risk Low fall risk Low fall risk   Patient at Risk for Falls Due to No Fall Risks  No Fall Risks    Fall risk Follow up Falls prevention discussed  Falls evaluation completed  Falls evaluation completed     SUMMARY AND PLAN:  Type 2 diabetes mellitus with diabetic neuropathy, without long-term current use of insulin (HCC) - Plan: Urine microalbumin-creatinine with uACR   Discussed applicable health maintenance/preventive health measures and advised and referred or ordered per patient preferences: -she agrees to schedule eye exam -labs placed by assistant -plans to get foot exam next office visit -can get vaccines at pharmacy, advised to notify if so so can update  Health Maintenance  Topic Date Due   Diabetic kidney evaluation - Urine ACR  10/20/2014   FOOT EXAM  10/27/2014   OPHTHALMOLOGY EXAM  05/21/2016   COVID-19 Vaccine (4 - 2023-24 season) 12/18/2022 (Originally 11/02/2022)   INFLUENZA VACCINE  06/01/2023 (Originally 10/02/2022)   HEMOGLOBIN A1C  01/18/2023   Diabetic kidney evaluation - eGFR measurement  08/03/2023   Medicare Annual Wellness (AWV)  12/02/2023   Colonoscopy  11/23/2026   DTaP/Tdap/Td (2 - Td or Tdap) 03/07/2031   Pneumonia Vaccine 71+ Years old  Completed   DEXA SCAN  Completed   Hepatitis C Screening  Completed   Zoster Vaccines- Shingrix  Completed   HPV VACCINES  Aged [employer] and counseling on the following was provided based on the above review of health and a plan/checklist for the patient, along with additional information discussed, was provided for the patient in the patient instructions :  -Advised on importance of completing advanced directives, discussed options for completing and provided information in patient instructions as well - she has only two living next of kin and reports they are aware of her preferences -Provided safe balance exercises that can be done at home to  improve balance and discussed exercise guidelines for adults with include balance exercises at least 3 days per week.  -Advised and counseled on a healthy lifestyle - including the importance of a healthy diet, regular physical activity -she declined counseling/care for mood, feels is doing ok and coping - did discuss exercise benefit, number for counseling in pt instructions -Reviewed patient's current diet. Advised and counseled on a whole foods based healthy diet. A summary of a healthy diet was provided in the Patient Instructions.  -reviewed patient's current physical activity level and discussed exercise guidelines  for adults. Discussed community resources and ideas for safe exercise at home to assist in meeting exercise guideline recommendations in a safe and healthy way.  -Advise yearly dental visits at minimum and regular eye exams  Follow up: see patient instructions     Patient Instructions  I really enjoyed getting to talk with you today! I am available on Tuesdays and Thursdays for virtual visits if you have any questions or concerns, or if I can be of any further assistance.   CHECKLIST FROM ANNUAL WELLNESS VISIT:  -Follow up (please call to schedule if not scheduled after visit):   -yearly for annual wellness visit with primary care office  Here is a list of your preventive care/health maintenance measures and the plan for each if any are due:  PLAN For any measures below that may be due:  -can get diabetic kidney exam and foot exam next time in office -please schedule eye exam -if you do any vaccines at the pharmacy please let us know so that we can update your record  Health Maintenance  Topic Date Due   Diabetic kidney evaluation - Urine ACR  10/20/2014   FOOT EXAM  10/27/2014   OPHTHALMOLOGY EXAM  05/21/2016   COVID-19 Vaccine (4 - 2023-24 season) 12/18/2022 (Originally 11/02/2022)   INFLUENZA VACCINE  06/01/2023 (Originally 10/02/2022)   HEMOGLOBIN A1C  01/18/2023    Diabetic kidney evaluation - eGFR measurement  08/03/2023   Medicare Annual Wellness (AWV)  12/02/2023   Colonoscopy  11/23/2026   DTaP/Tdap/Td (2 - Td or Tdap) 03/07/2031   Pneumonia Vaccine 30+ Years old  Completed   DEXA SCAN  Completed   Hepatitis C Screening  Completed   Zoster Vaccines- Shingrix  Completed   HPV VACCINES  Aged Out    -See a dentist at least yearly  -Get your eyes checked and then per your eye specialist's recommendations  -Other issues addressed today:   -I have included below further information regarding a healthy whole foods based diet, physical activity guidelines for adults, stress management and opportunities for social connections. I hope you find this information useful.   -----------------------------------------------------------------------------------------------------------------------------------------------------------------------------------------------------------------------------------------------------------  NUTRITION: -eat real food: lots of colorful vegetables (half the plate) and fruits -5-7 servings of vegetables and fruits per day (fresh or steamed is best), exp. 2 servings of vegetables with lunch and dinner and 2 servings of fruit per day. Berries and greens such as kale and collards are great choices.  -consume on a regular basis: whole grains (make sure first ingredient on label contains the word "whole"), fresh fruits, fish, nuts, seeds, healthy oils (such as olive oil, avocado oil, grape seed oil) -may eat small amounts of dairy and lean meat on occasion, but avoid processed meats such as ham, bacon, lunch meat, etc. -drink water -try to avoid fast food and pre-packaged foods, processed meat -most experts advise limiting sodium to < 2300mg  per day, should limit further is any chronic conditions such as high blood pressure, heart disease, diabetes, etc. The American Heart Association advised that < 1500mg  is is ideal -try to avoid  foods that contain any ingredients with names you do not recognize  -try to avoid sugar/sweets (except for the natural sugar that occurs in fresh fruit) -try to avoid sweet drinks -try to avoid white rice, white bread, pasta (unless whole grain), white or yellow potatoes  EXERCISE GUIDELINES FOR ADULTS: -if you wish to increase your physical activity, do so gradually and with the approval of your doctor -STOP and  seek medical care immediately if you have any chest pain, chest discomfort or trouble breathing when starting or increasing exercise  -move and stretch your body, legs, feet and arms when sitting for long periods -Physical activity guidelines for optimal health in adults: -least 150 minutes per week of aerobic exercise (can talk, but not sing) once approved by your doctor, 20-30 minutes of sustained activity or two 10 minute episodes of sustained activity every day.  -resistance training at least 2 days per week if approved by your doctor -balance exercises 3+ days per week:   Stand somewhere where you have something sturdy to hold onto if you lose balance.    1) lift up on toes, start with 5x per day and work up to 20x   2) stand and lift on leg straight out to the side so that foot is a few inches of the floor, start with 5x each side and work up to 20x each side   3) stand on one foot, start with 5 seconds each side and work up to 20 seconds on each side  If you need ideas or help with getting more active:  -Silver sneakers https://tools.silversneakers.com  -Walk with a Doc: http://www.duncan-williams.com/  -try to include resistance (weight lifting/strength building) and balance exercises twice per week: or the following link for ideas: http://castillo-powell.com/  BuyDucts.dk  STRESS MANAGEMENT: -can try meditating, or just sitting quietly with deep breathing while intentionally relaxing  all parts of your body for 5 minutes daily -if you need further help with stress, anxiety or depression please follow up with your primary doctor or contact the wonderful folks at [facility]: [phone]  SOCIAL CONNECTIONS: -options in [place] if you wish to engage in more social and exercise related activities:  -Silver sneakers https://tools.silversneakers.com  -Walk with a Doc: http://www.duncan-williams.com/  -Check out the [facility] Active Adults 50+ section on the [place] of [employer] (hiking clubs, book clubs, cards and games, chess, exercise classes, aquatic classes and much more) - see the website for details: https://www.[place].gov/departments/parks-recreation/active-adults50  -YouTube has lots of exercise videos for different ages and abilities as well  -[facility] (a variety of indoor and outdoor inperson activities for adults). [phone]. [address]  -Virtual Online Classes (a variety of topics): see seniorplanet.org or call [phone])[phone]  -consider volunteering at a school, hospice center, church, senior center or elsewhere   ADVANCED HEALTHCARE DIRECTIVES:   Advanced Directives assistance:   ExpressWeek.com.cy  Everyone should have advanced health care directives in place. This is so that you get the care you want, should you ever be in a situation where you are unable to make your own medical decisions.   From the [place] Advanced Directive Website: "Advance Health Care Directives are legal documents in which you give written instructions about your health care if, in the future, you cannot speak for yourself.   A health care power of attorney allows you to name a person you trust to make your health care decisions if you cannot make them yourself. A declaration of a desire for a natural death (or living will) is document, which states that you desire not to  have your life prolonged by extraordinary measures if you have a terminal or incurable illness or if you are in a vegetative state. An advance instruction for mental health treatment makes a declaration of instructions, information and preferences regarding your mental health treatment. It also states that you are aware that the advance instruction authorizes a mental health  treatment provider to act according to your wishes. It may also outline your consent or refusal of mental health treatment. A declaration of an anatomical gift allows anyone over the age of 64 to make a gift by will, organ donor card or other document."   Please see the following website or an elder law attorney for forms, FAQs and for completion of advanced directives: [place] TEFL teacher Health Care Directives Advance Health Care Directives (http://guzman.com/)  Or copy and paste the following to your web browser: PoshChat.fi    Terressa Koyanagi, DO

## 2022-12-16 ENCOUNTER — Other Ambulatory Visit: Payer: Self-pay | Admitting: Family Medicine

## 2022-12-17 ENCOUNTER — Ambulatory Visit (INDEPENDENT_AMBULATORY_CARE_PROVIDER_SITE_OTHER): Payer: Medicare HMO | Admitting: Family Medicine

## 2022-12-17 ENCOUNTER — Encounter: Payer: Self-pay | Admitting: Family Medicine

## 2022-12-17 VITALS — BP 124/80 | HR 75 | Temp 98.1°F | Wt 181.0 lb

## 2022-12-17 DIAGNOSIS — M48062 Spinal stenosis, lumbar region with neurogenic claudication: Secondary | ICD-10-CM | POA: Diagnosis not present

## 2022-12-17 DIAGNOSIS — F1190 Opioid use, unspecified, uncomplicated: Secondary | ICD-10-CM

## 2022-12-17 MED ORDER — HYDROCODONE-ACETAMINOPHEN 10-325 MG PO TABS: 1.0000 | ORAL_TABLET | Freq: Four times a day (QID) | ORAL | 0 refills | Status: DC | PRN

## 2022-12-17 NOTE — Progress Notes (Signed)
Subjective:    Patient ID: [name], female    DOB: June 02, 1945, 77 y.o.   MRN: [id number]  HPI Here for pain management. She is doing well. She seems to have recovered from her spinal surgery.   Review of Systems  Constitutional: Negative.   Musculoskeletal:  Positive for back pain.       Objective:   Physical Exam Constitutional:      Appearance: Normal appearance.  Neurological:     Mental Status: She is alert.           Assessment & Plan:  Pain management. Indication for chronic opioid: low back pain Medication and dose: Norco 10-325 # pills per month: 120 Last UDS date: 12-17-22 Opioid Treatment Agreement signed (Y/N): 06-19-17 Opioid Treatment Agreement last reviewed with patient:  12-17-22 [facility] reviewed this encounter (include red flags): Yes Meds were refilled.  Gershon Crane, MD

## 2022-12-18 ENCOUNTER — Other Ambulatory Visit: Payer: Self-pay | Admitting: Family Medicine

## 2022-12-18 NOTE — Progress Notes (Signed)
Wendy [name], please see what this is about

## 2022-12-19 LAB — DRUG MONITOR, PANEL 1, W/CONF, URINE
Amphetamines: NEGATIVE ng/mL (ref <500)
Barbiturates: NEGATIVE ng/mL (ref <300)
Benzodiazepines: NEGATIVE ng/mL (ref <100)
Cocaine Metabolite: NEGATIVE ng/mL (ref <150)
Codeine: NEGATIVE ng/mL (ref <50)
Creatinine: 267.3 mg/dL (ref >=20.0)
Hydrocodone: 3984 ng/mL — ABNORMAL HIGH (ref <50)
Hydromorphone: 220 ng/mL — ABNORMAL HIGH (ref <50)
Marijuana Metabolite: 582 ng/mL — ABNORMAL HIGH (ref <5)
Marijuana Metabolite: POSITIVE ng/mL — AB (ref <20)
Methadone Metabolite: NEGATIVE ng/mL (ref <100)
Morphine: NEGATIVE ng/mL (ref <50)
Norhydrocodone: 4036 ng/mL — ABNORMAL HIGH (ref <50)
Opiates: POSITIVE ng/mL — AB (ref <100)
Oxidant: NEGATIVE ug/mL (ref <200)
Oxycodone: NEGATIVE ng/mL (ref <100)
Phencyclidine: NEGATIVE ng/mL (ref <25)
pH: 5.3 (ref 4.5–9.0)

## 2022-12-19 LAB — HOUSE ACCOUNT TRACKING

## 2022-12-19 LAB — DM TEMPLATE

## 2023-01-09 ENCOUNTER — Ambulatory Visit (INDEPENDENT_AMBULATORY_CARE_PROVIDER_SITE_OTHER): Payer: Medicare HMO | Admitting: Family Medicine

## 2023-01-09 ENCOUNTER — Encounter: Payer: Self-pay | Admitting: Family Medicine

## 2023-01-09 VITALS — BP 128/80 | Temp 98.4°F | Wt 179.8 lb

## 2023-01-09 DIAGNOSIS — H9313 Tinnitus, bilateral: Secondary | ICD-10-CM | POA: Diagnosis not present

## 2023-01-09 DIAGNOSIS — B958 Unspecified staphylococcus as the cause of diseases classified elsewhere: Secondary | ICD-10-CM

## 2023-01-09 MED ORDER — MUPIROCIN 2 % EX OINT: 1.0000 | TOPICAL_OINTMENT | Freq: Two times a day (BID) | CUTANEOUS | 5 refills | Status: DC

## 2023-01-09 MED ORDER — DOXYCYCLINE HYCLATE 100 MG PO TABS: 100.0000 mg | ORAL_TABLET | Freq: Two times a day (BID) | ORAL | 0 refills | Status: DC

## 2023-01-09 NOTE — Progress Notes (Signed)
   Subjective:    Patient ID: Wendy [name], female    DOB: [birth year], 77 y.o.   MRN: [id number]  HPI Here for one week of a painful rash inside her nostrils and on her face beneath her nose. She was treated for a staph infection like this in August and it went away. She also has had nausea for the past few days. No fever or vomiting. She is using Ondansetron for this. Finally she complains of a "buzzing" noise in her head that comes and goes. This started a few months ago. It is worst at night.    Review of Systems  Constitutional: Negative.   HENT:  Positive for tinnitus. Negative for congestion, ear pain, hearing loss, postnasal drip, sinus pressure and sore throat.   Respiratory: Negative.    Cardiovascular: Negative.   Gastrointestinal:  Positive for nausea. Negative for abdominal distention, abdominal pain, blood in stool, constipation, diarrhea and vomiting.  Genitourinary: Negative.  Negative for dyspareunia.  Skin:  Positive for rash.       Objective:   Physical Exam Constitutional:      Appearance: Normal appearance. She is not ill-appearing.  HENT:     Right Ear: Tympanic membrane, ear canal and external ear normal.     Left Ear: Tympanic membrane, ear canal and external ear normal.     Nose:     Comments: There is a papular rash in both nostril that extends to the upper lip    Mouth/Throat:     Pharynx: Oropharynx is clear.  Eyes:     Conjunctiva/sclera: Conjunctivae normal.  Cardiovascular:     Rate and Rhythm: Normal rate and regular rhythm.     Pulses: Normal pulses.     Heart sounds: Normal heart sounds.  Pulmonary:     Effort: Pulmonary effort is normal.     Breath sounds: Normal breath sounds.  Abdominal:     General: Abdomen is flat. Bowel sounds are normal. There is no distension.     Palpations: Abdomen is soft. There is no mass.     Tenderness: There is no abdominal tenderness. There is no guarding or rebound.     Hernia: No hernia is present.   Lymphadenopathy:     Cervical: No cervical adenopathy.  Neurological:     General: No focal deficit present.     Mental Status: She is alert.           Assessment & Plan:  She has another staph infection, so we will treat this with Mupiricin ointment and with 10 days of Doxycycline. Refer to ENT for the tinnitus.  Gershon Crane, MD

## 2023-01-10 ENCOUNTER — Other Ambulatory Visit: Payer: Self-pay

## 2023-01-10 ENCOUNTER — Emergency Department (HOSPITAL_COMMUNITY)
Admission: EM | Admit: 2023-01-10 | Discharge: 2023-01-10 | Disposition: A | Discharge status: Home / Self Care | Payer: Medicare HMO | Attending: Emergency Medicine | Admitting: Emergency Medicine

## 2023-01-10 ENCOUNTER — Encounter (HOSPITAL_COMMUNITY): Payer: Self-pay | Admitting: Emergency Medicine

## 2023-01-10 ENCOUNTER — Emergency Department (HOSPITAL_COMMUNITY): Payer: Medicare HMO

## 2023-01-10 DIAGNOSIS — R918 Other nonspecific abnormal finding of lung field: Secondary | ICD-10-CM | POA: Diagnosis not present

## 2023-01-10 DIAGNOSIS — R638 Other symptoms and signs concerning food and fluid intake: Secondary | ICD-10-CM | POA: Diagnosis not present

## 2023-01-10 DIAGNOSIS — E119 Type 2 diabetes mellitus without complications: Secondary | ICD-10-CM | POA: Diagnosis not present

## 2023-01-10 DIAGNOSIS — R911 Solitary pulmonary nodule: Secondary | ICD-10-CM | POA: Diagnosis not present

## 2023-01-10 DIAGNOSIS — R531 Weakness: Secondary | ICD-10-CM | POA: Insufficient documentation

## 2023-01-10 DIAGNOSIS — K449 Diaphragmatic hernia without obstruction or gangrene: Secondary | ICD-10-CM | POA: Diagnosis not present

## 2023-01-10 DIAGNOSIS — Z7984 Long term (current) use of oral hypoglycemic drugs: Secondary | ICD-10-CM | POA: Insufficient documentation

## 2023-01-10 DIAGNOSIS — D259 Leiomyoma of uterus, unspecified: Secondary | ICD-10-CM | POA: Diagnosis not present

## 2023-01-10 DIAGNOSIS — R110 Nausea: Secondary | ICD-10-CM

## 2023-01-10 DIAGNOSIS — I6523 Occlusion and stenosis of bilateral carotid arteries: Secondary | ICD-10-CM | POA: Diagnosis not present

## 2023-01-10 DIAGNOSIS — I700 Atherosclerosis of aorta: Secondary | ICD-10-CM | POA: Diagnosis not present

## 2023-01-10 DIAGNOSIS — I10 Essential (primary) hypertension: Secondary | ICD-10-CM | POA: Insufficient documentation

## 2023-01-10 DIAGNOSIS — R109 Unspecified abdominal pain: Secondary | ICD-10-CM | POA: Diagnosis not present

## 2023-01-10 DIAGNOSIS — Z79899 Other long term (current) drug therapy: Secondary | ICD-10-CM | POA: Insufficient documentation

## 2023-01-10 DIAGNOSIS — R112 Nausea with vomiting, unspecified: Secondary | ICD-10-CM | POA: Insufficient documentation

## 2023-01-10 LAB — COMPREHENSIVE METABOLIC PANEL
ALT: 18 U/L (ref 0–44)
AST: 19 U/L (ref 15–41)
Albumin: 3.7 g/dL (ref 3.5–5.0)
Alkaline Phosphatase: 94 U/L (ref 38–126)
Anion gap: 8 (ref 5–15)
BUN: 11 mg/dL (ref 8–23)
CO2: 25 mmol/L (ref 22–32)
Calcium: 9 mg/dL (ref 8.9–10.3)
Chloride: 102 mmol/L (ref 98–111)
Creatinine, Ser: 0.86 mg/dL (ref 0.44–1.00)
GFR, Estimated: >60 mL/min (ref >60)
Glucose, Bld: 292 mg/dL — ABNORMAL HIGH (ref 70–99)
Potassium: 3.4 mmol/L — ABNORMAL LOW (ref 3.5–5.1)
Sodium: 135 mmol/L (ref 135–145)
Total Bilirubin: 1.5 mg/dL — ABNORMAL HIGH (ref <1.2)
Total Protein: 8.4 g/dL — ABNORMAL HIGH (ref 6.5–8.1)

## 2023-01-10 LAB — CBC
HCT: 42 % (ref 36.0–46.0)
Hemoglobin: 13 g/dL (ref 12.0–15.0)
MCH: 24.3 pg — ABNORMAL LOW (ref 26.0–34.0)
MCHC: 31 g/dL (ref 30.0–36.0)
MCV: 78.5 fL — ABNORMAL LOW (ref 80.0–100.0)
Platelets: 197 10*3/uL (ref 150–400)
RBC: 5.35 MIL/uL — ABNORMAL HIGH (ref 3.87–5.11)
RDW: 19.4 % — ABNORMAL HIGH (ref 11.5–15.5)
WBC: 5.7 10*3/uL (ref 4.0–10.5)
nRBC: 0 % (ref 0.0–0.2)

## 2023-01-10 LAB — URINALYSIS, ROUTINE W REFLEX MICROSCOPIC
Bilirubin Urine: NEGATIVE
Glucose, UA: >=500 mg/dL — AB
Hgb urine dipstick: NEGATIVE
Ketones, ur: NEGATIVE mg/dL
Leukocytes,Ua: NEGATIVE
Nitrite: NEGATIVE
Protein, ur: 100 mg/dL — AB
Specific Gravity, Urine: 1.021 (ref 1.005–1.030)
pH: 5 (ref 5.0–8.0)

## 2023-01-10 LAB — LIPASE, BLOOD: Lipase: 33 U/L (ref 11–51)

## 2023-01-10 MED ORDER — IOHEXOL 300 MG/ML  SOLN
75.0000 mL | Freq: Once | INTRAMUSCULAR | Status: AC | PRN
  Administered 2023-01-10: 75 mL via INTRAVENOUS

## 2023-01-10 MED ORDER — ONDANSETRON 8 MG PO TBDP: 8.0000 mg | ORAL_TABLET | Freq: Three times a day (TID) | ORAL | 0 refills | Status: DC | PRN

## 2023-01-10 MED ORDER — ONDANSETRON 4 MG PO TBDP
4.0000 mg | ORAL_TABLET | Freq: Once | ORAL | Status: AC | PRN
  Administered 2023-01-10: 4 mg via ORAL
  Filled 2023-01-10: qty 1

## 2023-01-10 MED ORDER — PANTOPRAZOLE SODIUM 40 MG PO TBEC: 40.0000 mg | DELAYED_RELEASE_TABLET | Freq: Every day | ORAL | 0 refills | Status: DC

## 2023-01-10 MED ORDER — ONDANSETRON HCL 4 MG/2ML IJ SOLN
4.0000 mg | Freq: Once | INTRAMUSCULAR | Status: AC
  Administered 2023-01-10: 4 mg via INTRAVENOUS
  Filled 2023-01-10: qty 2

## 2023-01-10 MED ORDER — IOHEXOL 300 MG/ML  SOLN
100.0000 mL | Freq: Once | INTRAMUSCULAR | Status: AC | PRN
  Administered 2023-01-10: 100 mL via INTRAVENOUS

## 2023-01-10 NOTE — Discharge Instructions (Addendum)
The CT scan showed a pulmonary nodule.  As we discussed this has increased in size and will need follow-up evaluation.  I have placed a hematology referral but please call the pulmonary office to schedule a follow-up appointment.  Consider seeing a GI doctor or your primary doctor for further evaluation of the nausea symptoms.

## 2023-01-10 NOTE — ED Provider Notes (Signed)
[facility] EMERGENCY DEPARTMENT AT [facility] Provider Note   CSN: [id number] Arrival date & time: 01/10/23  0945     History {Add pertinent medical, surgical, social history, OB history to HPI:1} Chief Complaint  Patient presents with   Nausea    Wendy [name] is a 77 y.o. female.  77 year old female with a history of chronic back pain on Norco, diabetes, hypertension, and hyperlipidemia who presents emergency department with generalized weakness, decreased appetite, nausea and vomiting for 3 weeks.  Also has had 10 pound weight loss.  Says that her last actual meal was on Monday of this week.  Also says she has diffuse abdominal pain that waxes and wanes.  Has had abdominal bloating.  Last bowel movement was also Monday.  Think she is passing less gas than usual.  No abdominal surgeries.  Went to her primary doctor yesterday and also was describing tenderness that has been longstanding and they were worried about a possible nasal infection so she was placed on mupirocin and doxycycline.  Denies a headache at this time.       Home Medications Prior to Admission medications   Medication Sig Start Date End Date Taking? Authorizing Provider  Accu-Chek Softclix Lancets lancets Test once per day and diagnosis code is E 11.9 and dispense for (ACCU-CHEK Soft Touch) 06/25/22   [name], MD  ALPRAZolam Prudy Feeler) 1 MG tablet Take 1 tablet (1 mg total) by mouth at bedtime as needed for anxiety or sleep. 08/27/22   [name], MD  amLODipine (NORVASC) 10 MG tablet TAKE 1 TABLET BY MOUTH EVERY DAY 01/31/22   [name], MD  atorvastatin (LIPITOR) 10 MG tablet Take 1 tablet (10 mg total) by mouth daily. 06/25/22   [name], MD  b complex vitamins tablet Take 1 tablet by mouth daily.    [provider]  Blood Glucose Monitoring Suppl (ACCU-CHEK GUIDE) w/Device KIT Use to test sugars daily. 02/07/21   [name], MD  Blood Pressure Monitor KIT Use to check  blood pressure. 02/17/20   [name], MD  Cholecalciferol (VITAMIN D3) 20 MCG (800 UNIT) TABS Take 800 Units by mouth daily.    [provider]  doxycycline (VIBRA-TABS) 100 MG tablet Take 1 tablet (100 mg total) by mouth 2 (two) times daily. 01/09/23   [name], MD  DULoxetine (CYMBALTA) 30 MG capsule TAKE 1 CAPSULE(30 MG) BY MOUTH DAILY 12/16/22   [name], MD  fluticasone (FLONASE) 50 MCG/ACT nasal spray SPRAY 2 SPRAYS INTO EACH NOSTRIL EVERY DAY Patient taking differently: Place 1 spray into both nostrils daily as needed for allergies or rhinitis. 12/27/21   [name], MD  furosemide (LASIX) 20 MG tablet Take 1 tablet (20 mg total) by mouth daily as needed (fluid retention). 09/23/21   [name], MD  glipiZIDE (GLUCOTROL) 10 MG tablet Take 0.5 tablets (5 mg total) by mouth 2 (two) times daily before a meal. 05/16/22   [name], MD  glucose blood (ACCU-CHEK GUIDE) test strip USE TO TEST ONCE DAILY 03/26/22   [name], MD  HYDROcodone-acetaminophen (NORCO) 10-325 MG tablet Take 1 tablet by mouth every 6 (six) hours as needed for moderate pain (pain score 4-6). 12/17/22   [name], MD  HYDROcodone-acetaminophen (NORCO) 10-325 MG tablet Take 1 tablet by mouth every 6 (six) hours as needed for moderate pain (pain score 4-6). 12/17/22   [name], MD  HYDROcodone-acetaminophen (NORCO) 10-325 MG tablet Take 1 tablet by mouth every 6 (six) hours as needed for moderate pain (pain score 4-6). 12/17/22   [name], MD  hydroxychloroquine (PLAQUENIL) 200 MG tablet TAKE 1 TABLET BY MOUTH EVERY DAY 10/16/22   [name], MD  JANUVIA 100 MG tablet TAKE 1 TABLET BY MOUTH EVERY DAY 11/10/22   [name], MD  Lancets (ACCU-CHEK SOFT [facility]) lancets Test once per day and diagnosis code is E 11.9 and dispense for Aviva plus 02/17/20   [name], MD  levocetirizine (XYZAL) 5 MG tablet Take 1 tablet (5 mg total) by mouth every evening. 06/25/22   [name], MD  metFORMIN (GLUCOPHAGE) 500 MG tablet Take 1 tablet (500 mg total) by mouth 2 (two) times daily with a meal. 09/23/21   [name], MD  mupirocin ointment (BACTROBAN) 2 % Apply 1 Application topically 2 (two) times daily. 01/09/23   [name], MD  omeprazole (PRILOSEC) 40 MG capsule TAKE 1 CAPSULE BY MOUTH EVERY DAY 09/15/22   [name], MD  ondansetron (ZOFRAN-ODT) 4 MG disintegrating tablet TAKE 1 TABLET BY MOUTH EVERY 8 HOURS AS NEEDED FOR NAUSEA AND VOMITING 09/24/22   [name], MD  polyethylene glycol (MIRALAX / GLYCOLAX) 17 g packet Take 17 g by mouth daily as needed for mild constipation.    [provider]  potassium chloride (KLOR-CON) 10 MEQ tablet Take 1 tablet (10 mEq total) by mouth daily. 06/25/22   [name], MD  RESTASIS 0.05 % ophthalmic emulsion Place 1 drop into both eyes 2 (two) times daily. 02/01/22   [provider]  rivaroxaban (XARELTO) 10 MG TABS tablet Take 1 tablet (10 mg total) by mouth daily. 08/09/22 09/08/22  London Sheer, MD      Allergies    Aspirin and Tramadol    Review of Systems   Review of Systems  Physical Exam Updated Vital Signs BP (!) 153/82 (BP Location: Right Arm)   Pulse 100   Temp 98.1 F (36.7 C)   Resp 18   Ht 5\' 3"  (1.6 m)   Wt 81.2 kg   SpO2 100%   BMI 31.71 kg/m  Physical Exam Vitals and nursing note reviewed.  Constitutional:      General: She is not in acute distress.    Appearance: She is well-developed.  HENT:     Head: Normocephalic and atraumatic.     Right Ear: External ear normal.     Left Ear: External ear normal.     Nose: Nose normal.  Eyes:     Extraocular Movements: Extraocular movements intact.     Conjunctiva/sclera: Conjunctivae normal.     Pupils: Pupils are equal, round, and reactive to light.  Cardiovascular:     Rate and Rhythm: Normal rate and regular rhythm.     Heart sounds: No murmur heard. Pulmonary:     Effort: Pulmonary effort is normal. No  respiratory distress.  Abdominal:     General: Abdomen is flat. There is distension.     Palpations: Abdomen is soft. There is no mass.     Tenderness: There is abdominal tenderness. There is no guarding.  Musculoskeletal:     Cervical back: Normal range of motion and neck supple.     Right lower leg: No edema.     Left lower leg: No edema.  Skin:    General: Skin is warm and dry.  Neurological:  Mental Status: She is alert and oriented to person, place, and time. Mental status is at baseline.  Psychiatric:        Mood and Affect: Mood normal.     ED Results / Procedures / Treatments   Labs (all labs ordered are listed, but only abnormal results are displayed) Labs Reviewed  COMPREHENSIVE METABOLIC PANEL - Abnormal; Notable for the following components:      Result Value   Potassium 3.4 (*)    Glucose, Bld 292 (*)    Total Protein 8.4 (*)    Total Bilirubin 1.5 (*)    All other components within normal limits  CBC - Abnormal; Notable for the following components:   RBC 5.35 (*)    MCV 78.5 (*)    MCH 24.3 (*)    RDW 19.4 (*)    All other components within normal limits  URINALYSIS, ROUTINE W REFLEX MICROSCOPIC - Abnormal; Notable for the following components:   Color, Urine AMBER (*)    APPearance HAZY (*)    Glucose, UA >=500 (*)    Protein, ur 100 (*)    Bacteria, UA RARE (*)    All other components within normal limits  LIPASE, BLOOD    EKG None  Radiology No results found.  Procedures Procedures  {Document cardiac monitor, telemetry assessment procedure when appropriate:1}  Medications Ordered in ED Medications  ondansetron (ZOFRAN) injection 4 mg (has no administration in time range)  ondansetron (ZOFRAN-ODT) disintegrating tablet 4 mg (4 mg Oral Given 01/10/23 1330)    ED Course/ Medical Decision Making/ A&P   {   Click here for ABCD2, HEART and other calculatorsREFRESH Note before signing :1}                              Medical Decision  Making Amount and/or Complexity of Data Reviewed Labs: ordered. Radiology: ordered.  Risk Prescription drug management.   ***  {Document critical care time when appropriate:1} {Document review of labs and clinical decision tools ie heart score, Chads2Vasc2 etc:1}  {Document your independent review of radiology images, and any outside records:1} {Document your discussion with family members, caretakers, and with consultants:1} {Document social determinants of health affecting pt's care:1} {Document your decision making why or why not admission, treatments were needed:1} Final Clinical Impression(s) / ED Diagnoses Final diagnoses:  None    Rx / DC Orders ED Discharge Orders     None

## 2023-01-10 NOTE — ED Notes (Signed)
Patient transported to CT 

## 2023-01-10 NOTE — ED Triage Notes (Signed)
Patient arrives in wheelchair by POV c/o not feeling well x 3 weeks. Reports lack of appetite and acid reflux this week not wanting to eat. Saw PCP yesterday and states he was not able to figure out what was causing this. Here for further evaluation.

## 2023-01-10 NOTE — ED Provider Notes (Signed)
Patient initially seen by Dr. Jarold Motto.  Please see his note.  CT scan of the head does not show any acute process.  Case discussed over the telephone with radiology, Dr Tish Frederickson.  CT scan chest abdomen pelvis does not show any acute process but there is a 2 cm nodule.  Apparently was a 1 cm nodule on a previous CT scan back in October of last year but this was not noted initially.  Case discussed with radiology.  Recommendation is for CT scan of her chest to help with her outpatient follow-up.  Patient would likely require tissue sampling of this pulmonary nodule.  Findings discussed with patient and her family.  Will proceed with the CT scan of the chest but will not make them wait for the results.  Medication she received earlier did help her nausea. Follow-up information given for [facility] pulmonology.  Will also place a referral to hematology.     Linwood Dibbles, MD 01/10/23 [phone]

## 2023-01-12 ENCOUNTER — Telehealth: Payer: Self-pay | Admitting: Family Medicine

## 2023-01-12 DIAGNOSIS — R911 Solitary pulmonary nodule: Secondary | ICD-10-CM

## 2023-01-12 NOTE — Telephone Encounter (Signed)
Done

## 2023-01-13 ENCOUNTER — Encounter (INDEPENDENT_AMBULATORY_CARE_PROVIDER_SITE_OTHER): Payer: Self-pay | Admitting: Otolaryngology

## 2023-01-15 ENCOUNTER — Ambulatory Visit: Payer: Medicare HMO | Admitting: Orthopedic Surgery

## 2023-01-15 ENCOUNTER — Encounter: Payer: Self-pay | Admitting: Emergency Medicine

## 2023-01-15 ENCOUNTER — Other Ambulatory Visit (INDEPENDENT_AMBULATORY_CARE_PROVIDER_SITE_OTHER): Payer: Medicare HMO

## 2023-01-15 ENCOUNTER — Ambulatory Visit (INDEPENDENT_AMBULATORY_CARE_PROVIDER_SITE_OTHER): Payer: Medicare HMO | Admitting: Emergency Medicine

## 2023-01-15 ENCOUNTER — Telehealth: Payer: Self-pay | Admitting: Emergency Medicine

## 2023-01-15 VITALS — BP 155/91 | HR 81 | Temp 98.3°F | Ht 64.0 in | Wt 183.4 lb

## 2023-01-15 DIAGNOSIS — M069 Rheumatoid arthritis, unspecified: Secondary | ICD-10-CM | POA: Diagnosis not present

## 2023-01-15 DIAGNOSIS — Z981 Arthrodesis status: Secondary | ICD-10-CM

## 2023-01-15 DIAGNOSIS — R911 Solitary pulmonary nodule: Secondary | ICD-10-CM | POA: Diagnosis not present
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
## 2023-01-15 NOTE — Progress Notes (Signed)
Orthopedic Surgery Office Visit   Procedure: L2/3 and L3/4 XLIF, L1-4 laminectomies, T10-pelvis PSIF Date of Surgery: 07/29/2022 (~6 months post-op)   Assessment: Patient is a 77 y.o. who is doing well after surgery     Plan: -Operative plans complete -Out of bed as tolerated, no brace -No spine specific restrictions -PCP has been managing her chronic pain -Return to office in 3 months, x-rays needed at next visit: AP/lateral/flex/ex lumbar   ___________________________________________________________________________     Subjective: Patient has been doing well since surgery.  Her back pain is currently tolerable.  She is not having any radiating leg pain.  She does have numbness in the left anterior lateral thigh.  No other numbness or paresthesias.  Is ambulating without assist devices.  Has not noticed any redness or drainage around her incisions.   Objective:   General: no acute distress, appropriate affect Neurologic: alert, answering questions appropriately, following commands Respiratory: unlabored breathing on room air Skin: incisions are well healed   MSK (spine):   -Strength exam                                                   Left                  Right   EHL                              5/5                  5/5 TA                                 5/5                  5/5 GSC                             5/5                  5/5 Knee extension            5/5                  5/5 Hip flexion                    4/5                  5/5   -Sensory exam                           Sensation intact to light touch in L3-S1 nerve distributions of bilateral lower extremities (decreased in left anterior thigh)    Imaging: XRs of the lumbar spine taken 01/15/2023 were independently reviewed and interpreted, showing instrumentation from T10-pelvis. No lucency seen around the screws. None of the screws have backed out. There are interbody devices at L2/3 and L3/4 that are in  appropriate position. PEEK interbody devices seen at L4/5 and L5/S1. No fracture or dislocation seen.      Patient name: Wendy [name] Patient MRN: [id number] Date of visit: 01/15/23

## 2023-01-15 NOTE — Telephone Encounter (Signed)
Patient would like PET Scan and PFT scheduled on the same day. Please call patient to get both scheduled. Patient and daughter would like a call back. [phone]

## 2023-01-15 NOTE — Patient Instructions (Signed)
VISIT SUMMARY:  Today, we discussed the findings of a pulmonary nodule that was noted on your recent CT scan. The nodule has increased in size over the past year. 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  YOUR PLAN:  -PULMONARY NODULE: A pulmonary nodule is a small growth in the lung. Your nodule has increased in size, which could be due to various reasons such as cancer, infection, or a rheumatoid nodule. We will order a PET scan to check the activity of the nodule and see if there is any lymph node involvement. Additionally, we will conduct Pulmonary Function Tests (PFTs) to evaluate your lung capacity and determine if surgery might be needed. We will discuss the results and next steps at your follow-up appointment.  -RHEUMATOID ARTHRITIS: Rheumatoid arthritis is an autoimmune condition that causes joint inflammation. Your condition is stable on hydroxychloroquine, and you should continue taking it as prescribed.  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 There are no current symptoms of active TB, so no changes to your management are needed.  INSTRUCTIONS:  Please schedule a PET scan and Pulmonary Function Tests (PFTs) as soon as possible. We will have a follow-up appointment to discuss the results and determine the next steps, which may include further tests (bronchoscopy) or surgery.

## 2023-01-16 DIAGNOSIS — R911 Solitary pulmonary nodule: Secondary | ICD-10-CM | POA: Insufficient documentation

## 2023-01-16 NOTE — Assessment & Plan Note (Signed)
Currently stable on hydroxychloroquine

## 2023-01-16 NOTE — Assessment & Plan Note (Signed)
History of TB exposure without any clear evidence for active TB.  Evaluation as above for her pulmonary nodule including culture data

## 2023-01-16 NOTE — Assessment & Plan Note (Signed)
2.4 cm right lower lobe pulmonary nodule noted to have increased in size from 8 mm to 10 mm of the past year.  Differential diagnosis includes malignancy, infection, rheumatoid nodule.  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  Plan to obtain PET scan to assess for metabolic activity in the nodule, possible lymph node involvement.  Will also order PFT to assess lung capacity and potential candidacy for surgical intervention.  We will plan follow-up to discuss these results and next steps in evaluation which may include surgical referral for bronchoscopy for tissue diagnosis

## 2023-01-19 NOTE — Progress Notes (Signed)
I let pt know that for now we can cancel her oncology referral as she will not need to be seen unless the results of the biopsy prove malignancy. Pt verbalized understanding. I gave the pt my direct number in case she has any questions or concerns about her upcoming appts.

## 2023-01-20 ENCOUNTER — Encounter (INDEPENDENT_AMBULATORY_CARE_PROVIDER_SITE_OTHER): Payer: Self-pay | Admitting: Otolaryngology

## 2023-01-21 ENCOUNTER — Ambulatory Visit: Payer: Medicare HMO | Admitting: Family Medicine

## 2023-01-21 ENCOUNTER — Encounter: Payer: Self-pay | Admitting: Family Medicine

## 2023-01-21 VITALS — BP 130/82 | HR 90 | Temp 98.2°F | Ht 64.0 in | Wt 178.8 lb

## 2023-01-21 DIAGNOSIS — R911 Solitary pulmonary nodule: Secondary | ICD-10-CM

## 2023-01-21 DIAGNOSIS — E119 Type 2 diabetes mellitus without complications: Secondary | ICD-10-CM

## 2023-01-21 DIAGNOSIS — M069 Rheumatoid arthritis, unspecified: Secondary | ICD-10-CM

## 2023-01-21 DIAGNOSIS — Z23 Encounter for immunization: Secondary | ICD-10-CM

## 2023-01-21 DIAGNOSIS — I10 Essential (primary) hypertension: Secondary | ICD-10-CM

## 2023-01-21 DIAGNOSIS — B1710 Acute hepatitis C without hepatic coma: Secondary | ICD-10-CM | POA: Diagnosis not present

## 2023-01-21 DIAGNOSIS — E1142 Type 2 diabetes mellitus with diabetic polyneuropathy: Secondary | ICD-10-CM

## 2023-01-21 DIAGNOSIS — K219 Gastro-esophageal reflux disease without esophagitis: Secondary | ICD-10-CM

## 2023-01-21 DIAGNOSIS — M48062 Spinal stenosis, lumbar region with neurogenic claudication: Secondary | ICD-10-CM | POA: Diagnosis not present

## 2023-01-21 LAB — CBC WITH DIFFERENTIAL/PLATELET
Basophils Absolute: 0 10*3/uL (ref 0.0–0.1)
Basophils Relative: 0.3 % (ref 0.0–3.0)
Eosinophils Absolute: 0 10*3/uL (ref 0.0–0.7)
Eosinophils Relative: 0.3 % (ref 0.0–5.0)
HCT: 41.7 % (ref 36.0–46.0)
Hemoglobin: 13.3 g/dL (ref 12.0–15.0)
Lymphocytes Relative: 28.8 % (ref 12.0–46.0)
Lymphs Abs: 1.6 10*3/uL (ref 0.7–4.0)
MCHC: 31.8 g/dL (ref 30.0–36.0)
MCV: 77.8 fL — ABNORMAL LOW (ref 78.0–100.0)
Monocytes Absolute: 0.7 10*3/uL (ref 0.1–1.0)
Monocytes Relative: 12.2 % — ABNORMAL HIGH (ref 3.0–12.0)
Neutro Abs: 3.3 10*3/uL (ref 1.4–7.7)
Neutrophils Relative %: 58.4 % (ref 43.0–77.0)
Platelets: 246 10*3/uL (ref 150.0–400.0)
RBC: 5.36 Mil/uL — ABNORMAL HIGH (ref 3.87–5.11)
RDW: 20.1 % — ABNORMAL HIGH (ref 11.5–15.5)
WBC: 5.7 10*3/uL (ref 4.0–10.5)

## 2023-01-21 LAB — HEPATIC FUNCTION PANEL
ALT: 11 U/L (ref 0–35)
AST: 15 U/L (ref 0–37)
Albumin: 4.1 g/dL (ref 3.5–5.2)
Alkaline Phosphatase: 110 U/L (ref 39–117)
Bilirubin, Direct: 0.2 mg/dL (ref 0.0–0.3)
Total Bilirubin: 1.3 mg/dL — ABNORMAL HIGH (ref 0.2–1.2)
Total Protein: 8.2 g/dL (ref 6.0–8.3)

## 2023-01-21 LAB — BASIC METABOLIC PANEL
BUN: 17 mg/dL (ref 6–23)
CO2: 30 meq/L (ref 19–32)
Calcium: 9.7 mg/dL (ref 8.4–10.5)
Chloride: 99 meq/L (ref 96–112)
Creatinine, Ser: 0.83 mg/dL (ref 0.40–1.20)
GFR: 68.66 mL/min (ref >60.00)
Glucose, Bld: 303 mg/dL — ABNORMAL HIGH (ref 70–99)
Potassium: 4.1 meq/L (ref 3.5–5.1)
Sodium: 137 meq/L (ref 135–145)

## 2023-01-21 LAB — LIPID PANEL
Cholesterol: 165 mg/dL (ref 0–200)
HDL: 63.8 mg/dL (ref >39.00)
LDL Cholesterol: 85 mg/dL (ref 0–99)
NonHDL: 101.38
Total CHOL/HDL Ratio: 3
Triglycerides: 82 mg/dL (ref 0.0–149.0)
VLDL: 16.4 mg/dL (ref 0.0–40.0)

## 2023-01-21 LAB — HEMOGLOBIN A1C: Hgb A1c MFr Bld: 9.6 % — ABNORMAL HIGH (ref 4.6–6.5)

## 2023-01-21 LAB — TSH: TSH: 2.08 u[IU]/mL (ref 0.35–5.50)

## 2023-01-21 MED ORDER — METFORMIN HCL 500 MG PO TABS: 500.0000 mg | ORAL_TABLET | Freq: Two times a day (BID) | ORAL | 3 refills | Status: DC

## 2023-01-21 MED ORDER — OMEPRAZOLE 40 MG PO CPDR: DELAYED_RELEASE_CAPSULE | ORAL | 3 refills | Status: DC

## 2023-01-21 MED ORDER — FUROSEMIDE 20 MG PO TABS: 20.0000 mg | ORAL_TABLET | Freq: Every day | ORAL | 3 refills | Status: AC | PRN

## 2023-01-21 MED ORDER — AMLODIPINE BESYLATE 10 MG PO TABS: 10.0000 mg | ORAL_TABLET | Freq: Every day | ORAL | 3 refills | Status: DC

## 2023-01-21 MED ORDER — DULOXETINE HCL 30 MG PO CPEP: 30.0000 mg | ORAL_CAPSULE | Freq: Every day | ORAL | 3 refills | Status: DC

## 2023-01-21 MED ORDER — SITAGLIPTIN PHOSPHATE 100 MG PO TABS: 100.0000 mg | ORAL_TABLET | Freq: Every day | ORAL | 3 refills | Status: DC

## 2023-01-21 NOTE — Progress Notes (Signed)
Subjective:    Patient ID: Wendy [name], female    DOB: 1945/12/20, 77 y.o.   MRN: [id number]  HPI Here to follow up on issues. She feels well in general other than her chronic low back pain. She is on a pain management program with [place] for that. She is seeing Dr. Levy Pupa for a RLL pulmonary nodule that has increased in size from last year to 2.4 cm. She is scheduled for a PET scan to evaluate this further on 02-05-23. He also plans to do a PFT soon. She tries to watch the sugars and carbs in her diet. Her GERD is stable. Her BP is well controlled. Her RA is stable. She gets yearly 3-D mammograms and yearly eye exams.    Review of Systems  Constitutional: Negative.   HENT: Negative.    Eyes: Negative.   Respiratory: Negative.    Cardiovascular: Negative.   Gastrointestinal: Negative.   Genitourinary:  Negative for decreased urine volume, difficulty urinating, dyspareunia, dysuria, enuresis, flank pain, frequency, hematuria, pelvic pain and urgency.  Musculoskeletal:  Positive for arthralgias and back pain.  Skin: Negative.   Neurological: Negative.  Negative for headaches.  Psychiatric/Behavioral: Negative.         Objective:   Physical Exam Constitutional:      General: She is not in acute distress.    Appearance: Normal appearance. She is well-developed.     Comments: Walks with a cane   HENT:     Head: Normocephalic and atraumatic.     Right Ear: External ear normal.     Left Ear: External ear normal.     Nose: Nose normal.     Mouth/Throat:     Pharynx: No oropharyngeal exudate.  Eyes:     General: No scleral icterus.    Conjunctiva/sclera: Conjunctivae normal.     Pupils: Pupils are equal, round, and reactive to light.  Neck:     Thyroid: No thyromegaly.     Vascular: No JVD.  Cardiovascular:     Rate and Rhythm: Normal rate and regular rhythm.     Pulses: Normal pulses.     Heart sounds: Normal heart sounds. No murmur heard.    No friction rub. No gallop.   Pulmonary:     Effort: Pulmonary effort is normal. No respiratory distress.     Breath sounds: Normal breath sounds. No wheezing or rales.  Chest:     Chest wall: No tenderness.  Abdominal:     General: Bowel sounds are normal. There is no distension.     Palpations: Abdomen is soft. There is no mass.     Tenderness: There is no abdominal tenderness. There is no guarding or rebound.  Musculoskeletal:        General: No tenderness. Normal range of motion.     Cervical back: Normal range of motion and neck supple.     Right lower leg: No edema.     Left lower leg: No edema.  Lymphadenopathy:     Cervical: No cervical adenopathy.  Skin:    General: Skin is warm and dry.     Findings: No erythema or rash.  Neurological:     General: No focal deficit present.     Mental Status: She is alert and oriented to person, place, and time.     Cranial Nerves: No cranial nerve deficit.     Motor: No abnormal muscle tone.     Coordination: Coordination normal.     Deep  Tendon Reflexes: Reflexes are normal and symmetric. Reflexes normal.  Psychiatric:        Mood and Affect: Mood normal.        Behavior: Behavior normal.        Thought Content: Thought content normal.        Judgment: Judgment normal.           Assessment & Plan:  Her HTN and diabetes are well controlled. Her GERD and RA are stable. Her low back pain is stable. Her neuropathy is stable. She will follow up with Dr. Delton Coombes for the pulmonary nodule. Get fasting labs for lipids, an A1c, etc. We spent a total of ( 35  ) minutes reviewing records and discussing these issues.  Gershon Crane, MD

## 2023-01-21 NOTE — Addendum Note (Signed)
Addended by: Carola Rhine on: 01/21/2023 04:10 PM   Modules accepted: Orders

## 2023-01-22 ENCOUNTER — Ambulatory Visit: Payer: Medicare HMO | Admitting: Physician Assistant

## 2023-01-22 ENCOUNTER — Encounter: Payer: Self-pay | Admitting: Physician Assistant

## 2023-01-22 VITALS — BP 124/76 | HR 84 | Ht 63.0 in | Wt 183.0 lb

## 2023-01-22 DIAGNOSIS — R112 Nausea with vomiting, unspecified: Secondary | ICD-10-CM | POA: Diagnosis not present

## 2023-01-22 MED ORDER — ONDANSETRON HCL 4 MG PO TABS: 4.0000 mg | ORAL_TABLET | Freq: Three times a day (TID) | ORAL | 5 refills | Status: DC | PRN

## 2023-01-22 NOTE — Progress Notes (Signed)
If still with problems, worthwhile repeating EGD  Minimize narcotics  agree with assessment/plan.  Edman Circle, MD [facility] [phone]

## 2023-01-22 NOTE — Progress Notes (Signed)
Chief Complaint: Nausea and vomiting  HPI:    Wendy [name] is a 77 year old female with a past medical history as listed below including chronic back pain on Norco, rheumatoid arthritis, diabetes, hypertension and hyperlipidemia, known to Dr. Christella Hartigan previously, who was referred to me by [name], MD for a complaint of nausea and vomiting.      11/23/2019 screening colonoscopy with one 4 mm polyp in the transverse colon and diverticulosis in the left colon.  Pathology showed tubular adenoma and repeat recommended 10 years.    01/10/2023 patient seen in the ED for nausea, decreased appetite, vomiting and generalized weakness with a 10 pound weight loss.  Apparently had been to PCP the day before with longstanding discomfort they thought she may have a nasal infection was started on Mupirocin and Doxycycline.  CT scan of the head did not show any acute process.  CT scan of the chest abdomen pelvis did not show any acute process but there was a 2 cm nodule, increased from 1 cm in October of last year in her chest which they recommended outpatient follow-up for.  Potassium 3.4, total bili 1.5 and otherwise normal LFTs.  Lipase normal.  CBC normal.  Patient given Zofran.  Patient referred to hematology/oncology.  Also given Pantoprazole 40 mg daily.    01/15/2023 patient saw pulmonology and at that time they are planning to obtain a PET scan to assess for metabolic activity in the nodule and possible lymph node involvement.  Also ordered PFT.    01/21/2023 patient saw PCP and at that time they discussed her reflux was stable.  Low back pain was stable.    Today, patient tells me at first that she is feeling completely well and having no GI symptoms at all.  She is having 2 daily bowel movements sometimes are a bit smaller than others and occasionally she will use MiraLAX but in general things are good.  When I am about to leave the room though she tells me that she still having some nausea and will use a  Zofran before taking her pills in the morning because other times she may vomit.  The Zofran helps to abate the symptoms.  She did take Pantoprazole for 10 to 14 days and does not feel like this really changed her nausea and was still using Zofran.    Understandably nervous about all of her pulmonary workup coming up.    Denies fever, chills or weight loss.  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 Zenovia Jordan     Past Surgical History:  Procedure Laterality Date   CATARACT EXTRACTION     COLONOSCOPY  11/23/2019   per Dr. Christella Hartigan, adenmatous polyp, repeat in 7 yrs   ESOPHAGOGASTRODUODENOSCOPY  06/20/2008   at [facility], clear  LUMBAR LAMINECTOMY     1995   ROTATOR CUFF REPAIR Right    SPINAL FUSION N/A 07/29/2022   Procedure: T10-PELVIS INSTRUMENTATION/ T10-L4 POSTERIOR SPINAL FUSION;  Surgeon: London Sheer, MD;  Location: [facility] OR;  Service: Orthopedics;  Laterality: N/A;   UPPER GASTROINTESTINAL ENDOSCOPY  2010   [facility]     Current Outpatient Medications  Medication Sig Dispense Refill   Accu-Chek Softclix Lancets lancets Test once per day and diagnosis code is E 11.9 and dispense for (ACCU-CHEK Soft Touch) 100 each 12   ALPRAZolam (XANAX) 1 MG tablet Take 1 tablet (1 mg total) by mouth at bedtime as needed for anxiety or sleep. 30 tablet 5   amLODipine (NORVASC) 10 MG tablet Take 1 tablet (10 mg total) by mouth daily. 90 tablet 3   atorvastatin (LIPITOR)  10 MG tablet Take 1 tablet (10 mg total) by mouth daily. 90 tablet 3   b complex vitamins tablet Take 1 tablet by mouth daily.     Blood Glucose Monitoring Suppl (ACCU-CHEK GUIDE) w/Device KIT Use to test sugars daily. 1 kit 0   Blood Pressure Monitor KIT Use to check blood pressure. 1 kit 0   Cholecalciferol (VITAMIN D3) 20 MCG (800 UNIT) TABS Take 800 Units by mouth daily.     DULoxetine (CYMBALTA) 30 MG capsule Take 1 capsule (30 mg total) by mouth daily. 90 capsule 3   fluticasone (FLONASE) 50 MCG/ACT nasal spray SPRAY 2 SPRAYS INTO EACH NOSTRIL EVERY DAY (Patient taking differently: Place 1 spray into both nostrils daily as needed for allergies or rhinitis.) 48 mL 3   furosemide (LASIX) 20 MG tablet Take 1 tablet (20 mg total) by mouth daily as needed (fluid retention). 90 tablet 3   glipiZIDE (GLUCOTROL) 10 MG tablet Take 0.5 tablets (5 mg total) by mouth 2 (two) times daily before a meal. 180 tablet 3   glucose blood (ACCU-CHEK GUIDE) test strip USE TO TEST ONCE DAILY 100 strip 1   HYDROcodone-acetaminophen (NORCO) 10-325 MG tablet Take 1 tablet by mouth every 6 (six) hours as needed for moderate pain (pain score 4-6). 120 tablet 0   HYDROcodone-acetaminophen (NORCO) 10-325 MG tablet Take 1 tablet by mouth every 6 (six) hours as needed for moderate pain (pain score 4-6). 120 tablet 0   HYDROcodone-acetaminophen (NORCO) 10-325 MG tablet Take 1 tablet by mouth every 6 (six) hours as needed for moderate pain (pain score 4-6). 120 tablet 0   hydroxychloroquine (PLAQUENIL) 200 MG tablet TAKE 1 TABLET BY MOUTH EVERY DAY 90 tablet 1   Lancets (ACCU-CHEK SOFT TOUCH) lancets Test once per day and diagnosis code is E 11.9 and dispense for Aviva plus 100 each 3   levocetirizine (XYZAL) 5 MG tablet Take 1 tablet (5 mg total) by mouth every evening. 90 tablet 3   metFORMIN (GLUCOPHAGE) 500 MG tablet Take 1 tablet (500 mg total) by mouth 2 (two) times daily with a meal. 180 tablet 3   mupirocin ointment  (BACTROBAN) 2 % Apply 1 Application topically 2 (two) times daily. 22 g 5   omeprazole (PRILOSEC) 40 MG capsule TAKE 1 CAPSULE BY MOUTH EVERY DAY 90 capsule 3   ondansetron (ZOFRAN-ODT) 8 MG disintegrating tablet Take 1 tablet (8 mg total) by mouth every 8 (eight) hours as needed for nausea or vomiting. 12 tablet 0   polyethylene glycol (MIRALAX / GLYCOLAX) 17 g packet Take 17 g by mouth daily as needed for mild constipation.     potassium chloride (KLOR-CON)  10 MEQ tablet Take 1 tablet (10 mEq total) by mouth daily. 90 tablet 3   RESTASIS 0.05 % ophthalmic emulsion Place 1 drop into both eyes 2 (two) times daily.     sitaGLIPtin (JANUVIA) 100 MG tablet Take 1 tablet (100 mg total) by mouth daily. 90 tablet 3   No current facility-administered medications for this visit.    Allergies as of 01/22/2023 - Review Complete 01/21/2023  Allergen Reaction Noted   Aspirin Nausea And Vomiting 11/05/2006   Tramadol Nausea And Vomiting 11/05/2006    Family History  Problem Relation Age of Onset   Arthritis Other    Diabetes Other    Hypertension Other    Cancer Other        lung   Lung cancer Mother    Colon cancer Neg Hx    Colon polyps Neg Hx    Esophageal cancer Neg Hx    Rectal cancer Neg Hx    Stomach cancer Neg Hx     Social History   Socioeconomic History   Marital status: Widowed    Spouse name: Not on file   Number of children: 2   Years of education: Not on file   Highest education level: Not on file  Occupational History   Not on file  Tobacco Use   Smoking status: Former    Current packs/day: 0.00    Average packs/day: (1.4 ttl pk-yrs)    Types: Cigarettes, Cigars    Start date: 04/19/1977    Quit date: 04/19/2022    Years since quitting: 0.7   Smokeless tobacco: Never   Tobacco comments:    1 black and mild every 2-3 days- no cigs - doesnt smoke the whole thing (quit smoking cigarettes around 2019)  Vaping Use   Vaping status: Never Used  Substance and Sexual  Activity   Alcohol use: Yes    Comment: maybe one drink on special occasions   Drug use: Not Currently   Sexual activity: Not on file  Other Topics Concern   Not on file  Social History Narrative   ** Merged History Encounter **       Social Determinants of Health   Financial Resource Strain: Low Risk  (01/16/2022)   Overall Financial Resource Strain (CARDIA)    Difficulty of Paying Living Expenses: Not very hard  Food Insecurity: No Food Insecurity (11/29/2021)   Hunger Vital Sign    Worried About Running Out of Food in the Last Year: Never true    Ran Out of Food in the Last Year: Never true  Transportation Needs: No Transportation Needs (11/29/2021)   PRAPARE - Administrator, Civil Service (Medical): No    Lack of Transportation (Non-Medical): No  Physical Activity: Insufficiently Active (11/29/2021)   Exercise Vital Sign    Days of Exercise per Week: 7 days    Minutes of Exercise per Session: 20 min  Stress: No Stress Concern Present (11/29/2021)   [employer] of Occupational Health - Occupational Stress Questionnaire    Feeling of Stress : Not at all  Social Connections: Unknown (01/17/2022)   [facility] from [facility], [facility]   Social Network    Social Network: Not on file  Intimate Partner Violence: Unknown (01/17/2022)   Received from [facility], [facility]   HITS    Physically Hurt: Not on file    Insult or Talk Down To: Not on file    Threaten Physical Harm: Not on file  Scream or Curse: Not on file    Review of Systems:    Constitutional: No weight loss, fever or chills Skin: No rash  Cardiovascular: No chest pain  Respiratory: No SOB  Gastrointestinal: See HPI and otherwise negative Genitourinary: No dysuria  Neurological: No headache, dizziness or syncope Musculoskeletal: No new muscle or joint pain Hematologic: No bleeding  Psychiatric: No history of depression or anxiety   Physical Exam:  Vital signs: BP 124/76    Pulse 84   Ht 5\' 3"  (1.6 m)   Wt 183 lb (83 kg)   BMI 32.42 kg/m    Constitutional:   Pleasant AA female appears to be in NAD, Well developed, Well nourished, alert and cooperative Head:  Normocephalic and atraumatic. Eyes:   PEERL, EOMI. No icterus. Conjunctiva pink. Ears:  Normal auditory acuity. Neck:  Supple Throat: Oral cavity and pharynx without inflammation, swelling or lesion.  Respiratory: Respirations even and unlabored. Lungs clear to auscultation bilaterally.   No wheezes, crackles, or rhonchi.  Cardiovascular: Normal S1, S2. No MRG. Regular rate and rhythm. No peripheral edema, cyanosis or pallor.  Gastrointestinal:  Soft, nondistended, nontender. No rebound or guarding. Normal bowel sounds. No appreciable masses or hepatomegaly. Rectal:  Not performed.  Msk:  Symmetrical without gross deformities. Without edema, no deformity or joint abnormality.  Neurologic:  Alert and  oriented x4;  grossly normal neurologically.  Skin:   Dry and intact without significant lesions or rashes. Psychiatric: Demonstrates good judgement and reason without abnormal affect or behaviors.  RELEVANT LABS AND IMAGING: CBC    Component Value Date/Time   WBC 5.7 01/21/2023 1207   RBC 5.36 (H) 01/21/2023 1207   HGB 13.3 01/21/2023 1207   HCT 41.7 01/21/2023 1207   PLT 246.0 01/21/2023 1207   MCV 77.8 (L) 01/21/2023 1207   MCH 24.3 (L) 01/10/2023 1004   MCHC 31.8 01/21/2023 1207   RDW 20.1 (H) 01/21/2023 1207   LYMPHSABS 1.6 01/21/2023 1207   MONOABS 0.7 01/21/2023 1207   EOSABS 0.0 01/21/2023 1207   BASOSABS 0.0 01/21/2023 1207    CMP     Component Value Date/Time   NA 137 01/21/2023 1207   K 4.1 01/21/2023 1207   CL 99 01/21/2023 1207   CO2 30 01/21/2023 1207   GLUCOSE 303 (H) 01/21/2023 1207   BUN 17 01/21/2023 1207   CREATININE 0.83 01/21/2023 1207   CREATININE 1.07 (H) 09/16/2019 1511   CALCIUM 9.7 01/21/2023 1207   PROT 8.2 01/21/2023 1207   ALBUMIN 4.1 01/21/2023 1207    AST 15 01/21/2023 1207   ALT 11 01/21/2023 1207   ALKPHOS 110 01/21/2023 1207   BILITOT 1.3 (H) 01/21/2023 1207   GFRNONAA >60 01/10/2023 1004   GFRAA >60 12/24/2016 1403    Assessment: 1.  Nausea: Continues per patient, worse when she takes her pills on an empty stomach, helped by Zofran as needed; consider relation to mild gastritis  Plan: 1.  Refilled Zofran 4 mg ODT to be used as needed for nausea.  #30 with 3 refills. 2.  Discussed with patient that if symptoms continue or she continues to require Zofran then may do another trial of Pantoprazole 40 mg daily. 3.  Discussed that she should take her pills with food. 4.  Patient to follow in clinic with [place] as needed.  Assigned to Dr. Chales Abrahams today.  Hyacinth Meeker, PA-C [facility] 01/22/2023, 10:33 AM  Cc: [name], MD

## 2023-01-22 NOTE — Addendum Note (Signed)
Addended by: Gershon Crane A on: 01/22/2023 07:58 AM   Modules accepted: Orders

## 2023-01-22 NOTE — Patient Instructions (Addendum)
We have sent the following medications to your pharmacy for you to pick up at your convenience:  Zofran  Please follow up as needed.  _______________________________________________________  If your blood pressure at your visit was 140/90 or greater, please contact your primary care physician to follow up on this.  _______________________________________________________  If you are age 76 or older, your body mass index should be between 23-30. Your Body mass index is 32.42 kg/m. If this is out of the aforementioned range listed, please consider follow up with your Primary Care Provider.  If you are age 49 or younger, your body mass index should be between 19-25. Your Body mass index is 32.42 kg/m. If this is out of the aformentioned range listed, please consider follow up with your Primary Care Provider.   ________________________________________________________  The [facility] providers would like to encourage you to use [facility] to communicate with providers for non-urgent requests or questions.  Due to long hold times on the telephone, sending your provider a message by [name] may be a faster and more efficient way to get a response.  Please allow 48 business hours for a response.  Please remember that this is for non-urgent requests.  _______________________________________________________

## 2023-02-05 ENCOUNTER — Ambulatory Visit (HOSPITAL_BASED_OUTPATIENT_CLINIC_OR_DEPARTMENT_OTHER): Payer: Medicare HMO | Admitting: Emergency Medicine

## 2023-02-05 ENCOUNTER — Ambulatory Visit (HOSPITAL_COMMUNITY)
Admission: RE | Admit: 2023-02-05 | Discharge: 2023-02-05 | Disposition: A | Discharge status: Home / Self Care | Payer: Medicare HMO | Source: Ambulatory Visit | Attending: Emergency Medicine | Admitting: Emergency Medicine

## 2023-02-05 DIAGNOSIS — R918 Other nonspecific abnormal finding of lung field: Secondary | ICD-10-CM | POA: Diagnosis not present

## 2023-02-05 DIAGNOSIS — R911 Solitary pulmonary nodule: Secondary | ICD-10-CM

## 2023-02-05 LAB — PULMONARY FUNCTION TEST
DL/VA % pred: 85 %
DL/VA: 3.54 ml/min/mmHg/L
DLCO cor % pred: 57 %
DLCO cor: 10.62 ml/min/mmHg
DLCO unc % pred: 57 %
DLCO unc: 10.59 ml/min/mmHg
FEF 25-75 Post: 0.77 L/s
FEF 25-75 Pre: 1.37 L/s
FEF2575-%Change-Post: -43 %
FEF2575-%Pred-Post: 49 %
FEF2575-%Pred-Pre: 88 %
FEV1-%Change-Post: -14 %
FEV1-%Pred-Post: 64 %
FEV1-%Pred-Pre: 75 %
FEV1-Post: 1.27 L
FEV1-Pre: 1.49 L
FEV1FVC-%Change-Post: -12 %
FEV1FVC-%Pred-Pre: 108 %
FEV6-%Change-Post: -5 %
FEV6-%Pred-Post: 68 %
FEV6-%Pred-Pre: 72 %
FEV6-Post: 1.73 L
FEV6-Pre: 1.84 L
FEV6FVC-%Pred-Post: 105 %
FEV6FVC-%Pred-Pre: 105 %
FVC-%Change-Post: -2 %
FVC-%Pred-Post: 67 %
FVC-%Pred-Pre: 69 %
FVC-Post: 1.79 L
FVC-Pre: 1.84 L
Post FEV1/FVC ratio: 71 %
Post FEV6/FVC ratio: 100 %
Pre FEV1/FVC ratio: 81 %
Pre FEV6/FVC Ratio: 100 %
RV % pred: 70 %
RV: 1.59 L
TLC % pred: 77 %
TLC: 3.82 L

## 2023-02-05 LAB — GLUCOSE, CAPILLARY: Glucose-Capillary: 210 mg/dL — ABNORMAL HIGH (ref 70–99)

## 2023-02-05 MED ORDER — FLUDEOXYGLUCOSE F - 18 (FDG) INJECTION
9.1000 | Freq: Once | INTRAVENOUS | Status: AC
  Administered 2023-02-05: 9.1 via INTRAVENOUS

## 2023-02-05 NOTE — Progress Notes (Signed)
Full PFT Performed Today  

## 2023-02-05 NOTE — Patient Instructions (Signed)
Full PFT Performed Today  

## 2023-02-06 ENCOUNTER — Encounter (INDEPENDENT_AMBULATORY_CARE_PROVIDER_SITE_OTHER): Payer: Self-pay

## 2023-02-06 ENCOUNTER — Ambulatory Visit (INDEPENDENT_AMBULATORY_CARE_PROVIDER_SITE_OTHER): Payer: Medicare HMO

## 2023-02-06 ENCOUNTER — Ambulatory Visit (INDEPENDENT_AMBULATORY_CARE_PROVIDER_SITE_OTHER): Payer: Medicare HMO | Admitting: Audiology

## 2023-02-06 VITALS — Ht 64.0 in | Wt 183.0 lb

## 2023-02-06 DIAGNOSIS — H903 Sensorineural hearing loss, bilateral: Secondary | ICD-10-CM

## 2023-02-06 DIAGNOSIS — H9313 Tinnitus, bilateral: Secondary | ICD-10-CM

## 2023-02-06 NOTE — Progress Notes (Signed)
  [address], [address] [place], [place] [address] [phone]  Audiological Evaluation    Name: Wendy [name]     DOB:   [birth year]      MRN:   [id number]                                                                                     Service Date: 02/06/2023     Accompanied by: significant other   Patient comes today after Dr. Suszanne Conners, ENT sent a referral for a hearing evaluation due to concerns with tinnitus.   Symptoms Yes Details  Hearing loss  []    Tinnitus  [x]  Reports a sizzling sound- cannot ay if in both ears or one.  Ear pain/ Ear infections  []    Balance problems  [x]  Somewhat off balance when getting up in the morning (mix of lightheaded with some spinning sensation)  Noise exposure  [x]  Some loud music  Previous ear surgeries  []    Family history  []    Amplification  []    Other  []      Otoscopy: Right ear: Clear external ear canals and notable landmarks visualized on the tympanic membrane. Left ear:  Clear external ear canals and notable landmarks visualized on the tympanic membrane.  Tympanometry: Right ear: Type A- Normal external ear canal volume with normal middle ear pressure and tympanic membrane compliance. Left ear: Type A- Normal external ear canal volume with normal middle ear pressure and tympanic membrane compliance.   Pure tone Audiometry: Right ear- Normal hearing from [phone] Hz, except for a mild presumably sensorineural loss at 8000 Hz.  Left ear-  Normal hearing from [phone] Hz, except for a mild presumably sensorineural loss at 8000 Hz.   The hearing test results were completed under headphones and re-checked with inserts and results are deemed to be of good reliability. Test technique:  conventional     Speech Audiometry: Right ear- Speech Reception Threshold (SRT) was obtained at 25 dBHL Left ear-Speech Reception Threshold (SRT) was obtained at 25 dBHL   Word Recognition Score Tested using NU-6 (MLV) Right ear: 92% was  obtained at a presentation level of 65 dBHL with contralateral masking which is deemed as  excellent. Left ear: 92% was obtained at a presentation level of 65 dBHL with contralateral masking which is deemed as  excellent.    Impression: There is not a significant difference in pure-tone thresholds between ears. There is not a significant difference in the word recognition score in between ears.    Recommendations: Follow up with ENT as scheduled for today. Return for a hearing evaluation if concerns with hearing changes arise or per MD recommendation. Recommend using a fan or sound generator to reduce the perception of the tinnitus, as needed.   Jaydenn Boccio MARIE LEROUX-MARTINEZ, AUD

## 2023-02-07 DIAGNOSIS — H903 Sensorineural hearing loss, bilateral: Secondary | ICD-10-CM | POA: Insufficient documentation

## 2023-02-07 NOTE — Progress Notes (Signed)
Patient ID: Wendy [name], female   DOB: February 20, 1946, 77 y.o.   MRN: [id number]  CC: Bilateral tinnitus  HPI:  Wendy [name] is a 77 y.o. female who presents today complaining of intermittent bilateral tinnitus for the past 3 weeks.  The patient describes the tinnitus as a hissing noise.  It is nonpulsatile.  The patient has no recent history of otitis media or otitis externa.  She has no previous otologic surgery.  She also denies any significant hearing difficulty.  Currently she denies any otalgia, otorrhea, or vertigo.  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 Zenovia Jordan     Past Surgical History:  Procedure Laterality Date   CATARACT EXTRACTION     COLONOSCOPY  11/23/2019   per Dr. Christella Hartigan, adenmatous polyp, repeat in 7 yrs   ESOPHAGOGASTRODUODENOSCOPY  06/20/2008   at [facility], clear    LUMBAR LAMINECTOMY     1995   ROTATOR CUFF REPAIR Right    SPINAL FUSION N/A 07/29/2022   Procedure: T10-PELVIS INSTRUMENTATION/ T10-L4 POSTERIOR SPINAL FUSION;  Surgeon: London Sheer, MD;  Location: [facility] OR;  Service: Orthopedics;  Laterality: N/A;   UPPER GASTROINTESTINAL ENDOSCOPY  2010   [facility]     Family History  Problem Relation Age of Onset   Arthritis Other    Diabetes Other    Hypertension Other    Cancer Other        lung   Lung cancer Mother    Colon cancer Neg Hx    Colon polyps Neg Hx    Esophageal cancer Neg Hx    Rectal cancer Neg Hx     Stomach cancer Neg Hx     Social History:  reports that she quit smoking about 9 months ago. Her smoking use included cigarettes and cigars. She started smoking about 45 years ago. She has a 1.4 pack-year smoking history. She has never used smokeless tobacco. She reports current alcohol use. She reports that she does not currently use drugs.  Allergies:  Allergies  Allergen Reactions   Aspirin Nausea And Vomiting   Tramadol Nausea And Vomiting    Prior to Admission medications   Medication Sig Start Date End Date Taking? Authorizing Provider  Accu-Chek Softclix Lancets lancets Test once per day and diagnosis code is E 11.9 and dispense for (ACCU-CHEK Soft Touch) 06/25/22  Yes [name], MD  ALPRAZolam Prudy Feeler) 1 MG tablet Take 1 tablet (1 mg total) by mouth at bedtime as needed for anxiety or sleep. 08/27/22  Yes [name], MD  amLODipine (NORVASC) 10 MG tablet Take 1 tablet (10 mg total) by mouth daily. 01/21/23  Yes [name], MD  atorvastatin (LIPITOR) 10 MG tablet Take 1 tablet (10 mg total) by mouth daily. 06/25/22  Yes [name], MD  Blood Glucose Monitoring Suppl (ACCU-CHEK GUIDE) w/Device KIT Use to test sugars daily. 02/07/21  Yes [name], MD  Blood Pressure Monitor KIT Use to check blood pressure. 02/17/20  Yes [name], MD  Cholecalciferol (VITAMIN D3) 20 MCG (800 UNIT) TABS Take 800 Units by mouth daily.   Yes [provider]  DULoxetine (CYMBALTA) 30 MG capsule Take 1 capsule (30 mg total) by mouth daily. 01/21/23  Yes [name], MD  fluticasone (FLONASE) 50 MCG/ACT nasal spray SPRAY 2 SPRAYS INTO EACH NOSTRIL EVERY DAY Patient taking differently: Place 1 spray into both nostrils daily as needed for allergies or rhinitis. 12/27/21  Yes [name], MD  furosemide (LASIX) 20 MG tablet Take 1 tablet (20 mg total) by mouth daily as needed (fluid retention). 01/21/23  Yes [name], MD  glipiZIDE (GLUCOTROL) 10 MG tablet Take 0.5  tablets (5 mg total) by mouth 2 (two) times daily before a meal. 05/16/22  Yes [name], MD  glucose blood (ACCU-CHEK GUIDE) test strip USE TO TEST ONCE DAILY 03/26/22  Yes [name], MD  HYDROcodone-acetaminophen (NORCO) 10-325 MG tablet Take 1 tablet by mouth every 6 (six) hours as needed for moderate pain (pain score 4-6). 12/17/22  Yes [name], MD  hydroxychloroquine (PLAQUENIL) 200 MG tablet TAKE 1 TABLET BY MOUTH EVERY DAY 10/16/22  Yes [name], MD  Lancets (ACCU-CHEK SOFT TOUCH) lancets Test once per day and diagnosis code is E 11.9 and dispense for Aviva plus 02/17/20  Yes [name], MD  levocetirizine (XYZAL) 5 MG tablet Take 1 tablet (5 mg total) by mouth every evening. 06/25/22  Yes [name], MD  metFORMIN (GLUCOPHAGE) 500 MG tablet Take 1 tablet (500 mg total) by mouth 2 (two) times daily with a meal. 01/21/23  Yes [name], MD  mupirocin ointment (BACTROBAN) 2 % Apply 1 Application topically 2 (two) times daily. 01/09/23  Yes [name], MD  omeprazole (PRILOSEC) 40 MG capsule TAKE 1 CAPSULE BY MOUTH EVERY DAY 01/21/23  Yes [name], MD  ondansetron (ZOFRAN) 4 MG tablet Take 1 tablet (4 mg total) by mouth every 8 (eight) hours as needed for nausea or vomiting. 01/22/23  Yes Unk Lightning, PA  ondansetron (ZOFRAN-ODT) 8 MG disintegrating tablet Take 1 tablet (8 mg total) by mouth every 8 (eight) hours as needed for nausea or vomiting. 01/10/23  Yes Linwood Dibbles, MD  polyethylene glycol (MIRALAX / GLYCOLAX) 17 g packet Take 17 g by mouth daily as needed for mild constipation.   Yes [provider]  potassium chloride (KLOR-CON) 10 MEQ tablet Take 1 tablet (10 mEq total) by mouth daily. 06/25/22  Yes [name], MD  RESTASIS 0.05 % ophthalmic emulsion Place 1 drop into both eyes 2 (two) times daily. 02/01/22  Yes [provider]  sitaGLIPtin (JANUVIA) 100 MG tablet Take 1 tablet (100 mg total) by mouth daily. 01/21/23   Yes [name], MD    Height 5\' 4"  (1.626 m), weight 183 lb (83 kg). Exam: General: Communicates without difficulty, well nourished, no acute distress. Head: Normocephalic, no evidence injury, no tenderness, facial buttresses intact without stepoff. Face/sinus: No tenderness to palpation and percussion. Facial movement is normal and symmetric. Eyes: PERRL, EOMI. No scleral icterus, conjunctivae clear. Neuro: CN II exam reveals vision grossly intact.  No nystagmus at any point of gaze. Ears: Auricles well formed without lesions.  Ear canals are  intact without mass or lesion.  No erythema or edema is appreciated.  The TMs are intact without fluid. Nose: External evaluation reveals normal support and skin without lesions.  Dorsum is intact.  Anterior rhinoscopy reveals normal mucosa over anterior aspect of inferior turbinates and intact septum.  No purulence noted. Oral:  Oral cavity and oropharynx are intact, symmetric, without erythema or edema.  Mucosa is moist without lesions. Neck: Full range of motion without pain.  There is no significant lymphadenopathy.  No masses palpable.  Thyroid bed within normal limits to palpation.  Parotid glands and submandibular glands equal bilaterally without mass.  Trachea is midline. Neuro:  CN 2-12 grossly intact.   Her hearing test shows bilateral mild sensorineural hearing loss.  Assessment: 1.  Bilateral mild sensorineural hearing loss, likely secondary to routine presbycusis. 2.  Her tinnitus is likely a direct result of her hearing loss. 3.  Her ear canals, tympanic membranes, and middle ear spaces are normal.  Plan: 1.  The physical exam findings and the hearing test results are reviewed with the patient. 2.  The strategies to cope with tinnitus, including the use of masker, hearing aids, tinnitus retraining therapy, and avoidance of caffeine and alcohol are discussed.  3.  The patient will return for reevaluation in 1 year, sooner if needed.  Wendy [name] W  Wendy [name] 02/07/2023, 9:40 PM

## 2023-02-20 ENCOUNTER — Encounter: Payer: Medicare HMO | Admitting: Primary Care

## 2023-02-20 ENCOUNTER — Telehealth: Payer: Self-pay | Admitting: Primary Care

## 2023-02-20 NOTE — Progress Notes (Signed)
 This encounter was created in error - please disregard.

## 2023-02-20 NOTE — Telephone Encounter (Signed)
Dr. Delton Coombes,   This is a patient of yours you saw for consult in November regarding pulmonary nodules. She is a former smoker, hx RA and pulmonary nodules. PET 02/05/23 scan showed hypermetabolic right lower lobe nodule measuring 2.7 x 2.2 cm (SUV max 10.7). Single hypermetabolic metastases to the right paratracheal lymph node. No evidence of distant metastatic disease. Lung function showed moderate restriction with moderate diffusion defect. FEV1 1.27 L (64% predicted).   Which would you consider bronch vs referral to TCTS ?

## 2023-02-20 NOTE — Telephone Encounter (Signed)
Patient was not present for today's virtual visit, called but no answer. LM to reschedule.

## 2023-02-23 NOTE — Telephone Encounter (Signed)
Because she has the mediastinal lymphadenopathy I think she needs bronchoscopy first.  We should place orders for possible navigational bronchoscopy plus EBUS, I could probably do on 03/16/2023.

## 2023-02-23 NOTE — Telephone Encounter (Signed)
Spoke with patient.  Gave information.  Scheduled patient.  Double booked on Beth's schedule for 03/06/2023 at 9:00 am (next to an Dargan check).  I saw where Dr Delton Coombes may want to do a bronchoscopy on 03/16/2023.  Beth, was this okay?  Please advise.

## 2023-02-23 NOTE — Telephone Encounter (Signed)
She no showed for her virtual visit, Amy please reschedule with any APP or Dr. Delton Coombes to discuss

## 2023-02-23 NOTE — Telephone Encounter (Signed)
That's fine, we will go over results and discuss bronchoscopy with her on the 13th

## 2023-02-26 ENCOUNTER — Other Ambulatory Visit: Payer: Self-pay | Admitting: Family Medicine

## 2023-03-05 ENCOUNTER — Other Ambulatory Visit: Payer: Self-pay | Admitting: Family Medicine

## 2023-03-05 DIAGNOSIS — K219 Gastro-esophageal reflux disease without esophagitis: Secondary | ICD-10-CM

## 2023-03-05 NOTE — Telephone Encounter (Signed)
 Copied from CRM [id number]. Topic: Clinical - Medication Refill >> Mar 05, 2023  1:46 PM Thersia BROCKS wrote: Most Recent Primary Care Visit:  Provider: JOHNNY SENIOR A  Department: [facility]  Visit Type: PHYSICAL  Date: 01/21/2023  Medication: fluticasone  (FLONASE ) 50 MCG/ACT nasal spray atorvastatin  (LIPITOR) 10 MG tablet   Has the patient contacted their pharmacy? Yes (Agent: If no, request that the patient contact the pharmacy for the refill. If patient does not wish to contact the pharmacy document the reason why and proceed with request.) (Agent: If yes, when and what did the pharmacy advise?)  Is this the correct pharmacy for this prescription? Yes If no, delete pharmacy and type the correct one.  This is the patient's preferred pharmacy:   [facility] [phone] [address],  - [address] [place] [address] Phone: [phone] Fax: [phone]     Has the prescription been filled recently? Yes  Is the patient out of the medication?   Has the patient been seen for an appointment in the last year OR does the patient have an upcoming appointment?   Can we respond through MyChart?   Agent: Please be advised that Rx refills may take up to 3 business days. We ask that you follow-up with your pharmacy.

## 2023-03-06 ENCOUNTER — Telehealth: Payer: Medicare HMO | Admitting: Primary Care

## 2023-03-09 MED ORDER — ATORVASTATIN CALCIUM 10 MG PO TABS: 10.0000 mg | ORAL_TABLET | Freq: Every day | ORAL | 3 refills | Status: DC

## 2023-03-09 MED ORDER — LEVOCETIRIZINE DIHYDROCHLORIDE 5 MG PO TABS: 5.0000 mg | ORAL_TABLET | Freq: Every evening | ORAL | 3 refills | Status: DC

## 2023-03-09 MED ORDER — SITAGLIPTIN PHOSPHATE 100 MG PO TABS: 100.0000 mg | ORAL_TABLET | Freq: Every day | ORAL | 3 refills | Status: DC

## 2023-03-09 MED ORDER — OMEPRAZOLE 40 MG PO CPDR: DELAYED_RELEASE_CAPSULE | ORAL | 3 refills | Status: AC

## 2023-03-09 MED ORDER — FLUTICASONE PROPIONATE 50 MCG/ACT NA SUSP: NASAL | 3 refills | Status: AC

## 2023-03-09 MED ORDER — GLIPIZIDE 10 MG PO TABS: 5.0000 mg | ORAL_TABLET | Freq: Two times a day (BID) | ORAL | Status: DC

## 2023-03-12 ENCOUNTER — Other Ambulatory Visit: Payer: Self-pay | Admitting: Family Medicine

## 2023-03-12 ENCOUNTER — Telehealth: Payer: Self-pay | Admitting: *Deleted

## 2023-03-12 NOTE — Telephone Encounter (Signed)
 Copied from CRM [id number]. Topic: Clinical - Medication Refill >> Mar 05, 2023  1:46 PM Thersia BROCKS wrote: Most Recent Primary Care Visit:  Provider: JOHNNY SENIOR A  Department: [facility]  Visit Type: PHYSICAL  Date: 01/21/2023  Medication: fluticasone  (FLONASE ) 50 MCG/ACT nasal spray atorvastatin  (LIPITOR) 10 MG tablet glipiZIDE  (GLUCOTROL ) 10 MG tablet levocetirizine (XYZAL ) 5 MG tablet omeprazole  (PRILOSEC) 40 MG capsule sitaGLIPtin  (JANUVIA ) 100 MG tablet    Has the patient contacted their pharmacy? Yes (Agent: If no, request that the patient contact the pharmacy for the refill. If patient does not wish to contact the pharmacy document the reason why and proceed with request.) (Agent: If yes, when and what did the pharmacy advise?)  Is this the correct pharmacy for this prescription? Yes If no, delete pharmacy and type the correct one.  This is the patient's preferred pharmacy:   [facility] [phone] [address], [place] - [address] [place] [address] Phone: [phone] Fax: [phone]     Has the prescription been filled recently? Yes  Is the patient out of the medication? Yes  Has the patient been seen for an appointment in the last year OR does the patient have an upcoming appointment? Yes  Can we respond through MyChart? Yes  Agent: Please be advised that Rx refills may take up to 3 business days. We ask that you follow-up with your pharmacy.

## 2023-03-17 ENCOUNTER — Telehealth: Payer: Self-pay

## 2023-03-17 ENCOUNTER — Encounter: Payer: Self-pay | Admitting: Family Medicine

## 2023-03-17 ENCOUNTER — Other Ambulatory Visit: Payer: Self-pay | Admitting: Family Medicine

## 2023-03-17 ENCOUNTER — Telehealth: Payer: Self-pay | Admitting: Family Medicine

## 2023-03-17 NOTE — Telephone Encounter (Signed)
 Already done

## 2023-03-17 NOTE — Telephone Encounter (Signed)
 Copied from CRM [id number]. Topic: Appointments - Scheduling Inquiry for Clinic >> Mar 17, 2023 10:20 AM Victoria A wrote: Reason for CRM: Patient called because she said that she had left a message and no one has returned her called regarding scheduling a Pain Management Appt with Dr.Fry and she has not received her Lab Results . Agent checked but did not see labs from January only from the ED in December and patient said she had had labs after December

## 2023-03-17 NOTE — Telephone Encounter (Signed)
 Copied from CRM [id number]. Topic: Appointments - Appointment Info/Confirmation >> Mar 17, 2023  9:00 AM Benton KIDD wrote: Patient/patient representative is calling for information regarding an appointment. Patient is calling about when her pain management appointment is suppose to be schedule or when is it . I didn't see anything . Please get back to patient with this information call back number [phone]  LM for patient to call and schedule PMV

## 2023-03-17 NOTE — Telephone Encounter (Signed)
Left pt a message advised to call the office back regarding this message

## 2023-03-18 NOTE — Telephone Encounter (Signed)
 Attempted pt several times with no success.Will keep trying

## 2023-03-19 ENCOUNTER — Other Ambulatory Visit: Payer: Self-pay

## 2023-03-19 MED ORDER — GLIPIZIDE 10 MG PO TABS: 10.0000 mg | ORAL_TABLET | Freq: Two times a day (BID) | ORAL | 1 refills | Status: DC

## 2023-03-19 NOTE — Telephone Encounter (Signed)
Spoke with pt scheduled for a VV on PMV

## 2023-03-20 ENCOUNTER — Telehealth: Payer: Medicare HMO | Admitting: Primary Care

## 2023-03-20 ENCOUNTER — Telehealth: Payer: Self-pay | Admitting: Primary Care

## 2023-03-20 ENCOUNTER — Telehealth: Payer: Self-pay

## 2023-03-20 DIAGNOSIS — R911 Solitary pulmonary nodule: Secondary | ICD-10-CM

## 2023-03-20 NOTE — Progress Notes (Signed)
Care Guide Pharmacy Note  03/20/2023 Name: [name] MRN: [id number] DOB: 1945/06/11  Referred By: [name], MD Reason for referral: Care Coordination ([facility] Diabetes. )   Wendy [name] is a 78 y.o. year old female who is a primary care patient of [name], MD.  [name] was referred to the pharmacist for assistance related to: DMII  Successful contact was made with the patient to discuss pharmacy services.  Patient declines engagement at this time. Contact information was provided to the patient should they wish to reach out for assistance at a later time.  [facility]  [facility], [facility] Care Management Assistant Direct Dial: [phone]  Fax: [phone]

## 2023-03-20 NOTE — Telephone Encounter (Signed)
Spoke with patient today regarding her PET scan results.  We reviewed risks and benefits of bronchoscopy.  She is open to proceeding with procedure. She is no on any blood thinners. Lungs function is moderately obstructed, FEV1 1.27 (64%) predicted post BD.   Dr. Delton Coombes when can you do navigation bronch with possible EBUS.  I will order Super-D SCAN.    Cc: procedural pool
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
## 2023-03-20 NOTE — Patient Instructions (Addendum)
PET scan showed hypermetabolic right lower lobe nodule measuring 2.7 x 2.2 cm (SUV max 10.7), consistent with bronchogenic carcinoma.   Single hypermetabolic metastases to the right paratracheal lymph node.  Because she has the mediastinal lymphadenopathy Dr. Delton Coombes has recommend you have a bronchoscopy    Endobronchial Ultrasound  Endobronchial ultrasound (EBUS) is a procedure done to check your lungs for inflammation, infection, or cancer. During this procedure, a thin and flexible scope (bronchoscope) is passed through your mouth and into one or both of your lungs. The bronchoscope has a video camera and an ultrasound probe on the end. The ultrasound probe uses sound waves to take imaging studies of your lung and the lymph nodes near your lung. The camera will send images of your lung to a video screen in the procedure room. A needle passed through the scope may be used to take lung tissue samples for evaluation and testing (needle biopsy). Tell a health care provider about: Any allergies you have. All medicines you are taking, including vitamins, herbs, eye drops, creams, and over-the-counter medicines. Any problems you or family members have had with anesthetic medicines. Any bleeding problems you have. Any surgeries you have had. Any medical conditions you have. Whether you are pregnant or may be pregnant. What are the risks? Generally, this is a safe procedure. However, problems may occur, including: Infection. Bleeding. Allergic reactions to medicines. Damage to nearby structures or organs. Lung collapse. What happens before the procedure? Medicines Ask your health care provider about: Changing or stopping your regular medicines. This is especially important if you are taking diabetes medicines or blood thinners. Taking medicines such as aspirin and ibuprofen. These medicines can thin your blood. Do not take these medicines unless your health care provider tells you to take  them. Taking over-the-counter medicines, vitamins, herbs, and supplements. General instructions Follow instructions from your health care provider about eating and drinking, which may include having nothing to eat or drink after midnight on the night before the procedure. If you will be going home right after the procedure, plan to have a responsible adult: Take you home from the hospital or clinic. You will not be allowed to drive. Care for you for the time you are told. What happens during the procedure? An IV will be inserted into one of your veins. You will be given one or more of the following: A medicine to help you relax (sedative). This may make you sleepy. A medicine to numb the area (local anesthetic). This medicine may be sprayed into your throat. It will make you feel more comfortable and keep you from gagging or coughing during the procedure. A medicine to make you fall asleep (general anesthetic). When you are sleepy and relaxed, the scope will be passed through your mouth, into your windpipe, and down into your lung or lungs. Your health care provider will view the video images from the camera to look at the inside of your airways. Ultrasound imaging studies may be done to get information about lung tissue or the lymph nodes near your lungs. A needle may be used to take tissue samples. When the procedure is over, the scope will be removed, and you will be taken to a recovery area. The procedure may vary among health care providers and hospitals. What can I expect after the procedure? Your blood pressure, heart rate, breathing rate, and blood oxygen level will be monitored until you leave the hospital or clinic. If a tissue sample was taken, it is up  to you to get the results of your procedure. Ask your health care provider, or the department that is doing the procedure, when your results will be ready. In most cases, you may go home after the procedure. After this procedure, it  is common to have a sore throat and a slight cough. These should go away in 1-2 days. Follow these instructions at home: Medicines Take over-the-counter and prescription medicines only as told by your health care provider. If you were given a sedative during the procedure, it can affect you for several hours. Do not drive or operate machinery until your health care provider says that it is safe. General instructions  Do not eat or drink anything until the numbing medicine (local anesthetic) has worn off and your gag reflex has returned. You will know that the local anesthetic has worn off when you can swallow comfortably. Do not use any products that contain nicotine or tobacco. These products include cigarettes, chewing tobacco, and vaping devices, such as e-cigarettes. If you need help quitting, ask your health care provider. Return to your normal activities as told by your health care provider. Ask your health care provider what activities are safe for you. Keep all follow-up visits. This is important. Contact a health care provider if: You have chills or a fever. You have a cough or sore throat that does not go away. You cough up blood. Get help right away if: You have chest pain or difficulty breathing. These symptoms may represent a serious problem that is an emergency. Do not wait to see if the symptoms will go away. Get medical help right away. Call your local emergency services (911 in the [place] Do not drive yourself to the hospital. Summary EBUS is done to check your lungs for inflammation, infection, or cancer. During this procedure, a bronchoscope is passed through your mouth and into one or both of your lungs. The bronchoscope has a video camera and an ultrasound probe on the end. The camera will send images of your lung to a video screen in the procedure room. A needle biopsy may also be done during the procedure. In most cases, you may go home after the procedure. This  information is not intended to replace advice given to you by your health care provider. Make sure you discuss any questions you have with your health care provider. Document Revised: 08/14/2020 Document Reviewed: 08/14/2020 Elsevier Patient Education  2024 [employer].
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
## 2023-03-23 NOTE — Telephone Encounter (Signed)
Request placed for advanced bronchoscopy, goal on 04/07/23

## 2023-03-24 ENCOUNTER — Telehealth (INDEPENDENT_AMBULATORY_CARE_PROVIDER_SITE_OTHER): Payer: Medicare HMO | Admitting: Family Medicine

## 2023-03-24 ENCOUNTER — Encounter: Payer: Self-pay | Admitting: Emergency Medicine

## 2023-03-24 ENCOUNTER — Encounter: Payer: Self-pay | Admitting: Family Medicine

## 2023-03-24 DIAGNOSIS — M48062 Spinal stenosis, lumbar region with neurogenic claudication: Secondary | ICD-10-CM | POA: Diagnosis not present

## 2023-03-24 DIAGNOSIS — F1190 Opioid use, unspecified, uncomplicated: Secondary | ICD-10-CM | POA: Diagnosis not present

## 2023-03-24 MED ORDER — HYDROCODONE-ACETAMINOPHEN 10-325 MG PO TABS: 1.0000 | ORAL_TABLET | Freq: Four times a day (QID) | ORAL | 0 refills | Status: DC | PRN

## 2023-03-24 MED ORDER — ATORVASTATIN CALCIUM 10 MG PO TABS: 10.0000 mg | ORAL_TABLET | Freq: Every day | ORAL | 3 refills | Status: DC

## 2023-03-24 MED ORDER — AMLODIPINE BESYLATE 10 MG PO TABS: 10.0000 mg | ORAL_TABLET | Freq: Every day | ORAL | 3 refills | Status: DC

## 2023-03-24 MED ORDER — ONDANSETRON HCL 4 MG PO TABS: 4.0000 mg | ORAL_TABLET | Freq: Three times a day (TID) | ORAL | 3 refills | Status: AC | PRN

## 2023-03-24 NOTE — Telephone Encounter (Signed)
Pt is scheduled on 04-15-23 for Sarah, NP. NFN

## 2023-03-24 NOTE — Progress Notes (Signed)
Subjective:    Patient ID: Wendy [name], female    DOB: February 19, 1946, 78 y.o.   MRN: [id number]  HPI Virtual Visit via Video Note  I connected with the patient on 03/24/23 at  1:30 PM EST by a video enabled telemedicine application and verified that I am speaking with the correct person using two identifiers.  Location patient: home Location provider:work or home office Persons participating in the virtual visit: patient, provider  I discussed the limitations of evaluation and management by telemedicine and the availability of in person appointments. The patient expressed understanding and agreed to proceed.   HPI: Here for pain management, she is doing well.    ROS: See pertinent positives and negatives per HPI.  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 Zenovia Jordan     Past Surgical History:  Procedure Laterality Date   CATARACT EXTRACTION     COLONOSCOPY  11/23/2019   per Dr. Christella Hartigan, adenmatous polyp, repeat in 7 yrs   ESOPHAGOGASTRODUODENOSCOPY  06/20/2008   at [facility], clear    LUMBAR LAMINECTOMY     1995   ROTATOR CUFF REPAIR Right    SPINAL FUSION N/A 07/29/2022   Procedure: T10-PELVIS INSTRUMENTATION/ T10-L4 POSTERIOR SPINAL FUSION;  Surgeon: London Sheer, MD;  Location: [facility] OR;  Service: Orthopedics;  Laterality: N/A;   UPPER GASTROINTESTINAL ENDOSCOPY  2010   [facility]     Family History  Problem Relation Age of Onset   Arthritis  Other    Diabetes Other    Hypertension Other    Cancer Other        lung   Lung cancer Mother    Colon cancer Neg Hx    Colon polyps Neg Hx    Esophageal cancer Neg Hx    Rectal cancer Neg Hx    Stomach cancer Neg Hx      Current Outpatient Medications:    Accu-Chek Softclix Lancets lancets, Test once per day and diagnosis code is E 11.9 and dispense for (ACCU-CHEK Soft Touch), Disp: 100 each, Rfl: 12   ALPRAZolam (XANAX) 1 MG tablet, TAKE 1 TABLET(1 MG) BY MOUTH AT BEDTIME AS NEEDED FOR ANXIETY OR SLEEP, Disp: 90 tablet, Rfl: 1   Blood Glucose Monitoring Suppl (ACCU-CHEK GUIDE) w/Device KIT, Use to test sugars daily., Disp: 1 kit, Rfl: 0   Blood Pressure Monitor KIT, Use to check blood pressure., Disp: 1 kit, Rfl: 0   Cholecalciferol (VITAMIN D3) 20 MCG (800 UNIT) TABS, Take 800 Units by mouth daily., Disp: , Rfl:    DULoxetine (CYMBALTA) 30 MG capsule, Take 1 capsule (30 mg total) by mouth daily., Disp: 90 capsule, Rfl: 3   fluticasone (FLONASE) 50 MCG/ACT nasal spray, SPRAY 2 SPRAYS INTO EACH NOSTRIL EVERY DAY, Disp: 48 mL, Rfl: 3   furosemide (LASIX)  20 MG tablet, Take 1 tablet (20 mg total) by mouth daily as needed (fluid retention)., Disp: 90 tablet, Rfl: 3   glipiZIDE (GLUCOTROL) 10 MG tablet, Take 1 tablet (10 mg total) by mouth 2 (two) times daily before a meal., Disp: 60 tablet, Rfl: 1   glucose blood (ACCU-CHEK GUIDE) test strip, USE TO TEST ONCE DAILY, Disp: 100 strip, Rfl: 1   hydroxychloroquine (PLAQUENIL) 200 MG tablet, TAKE 1 TABLET BY MOUTH EVERY DAY, Disp: 90 tablet, Rfl: 1   Lancets (ACCU-CHEK SOFT TOUCH) lancets, Test once per day and diagnosis code is E 11.9 and dispense for Aviva plus, Disp: 100 each, Rfl: 3   levocetirizine (XYZAL) 5 MG tablet, Take 1 tablet (5 mg total) by mouth every evening., Disp: 90 tablet, Rfl: 3   metFORMIN (GLUCOPHAGE) 500 MG tablet, Take 1 tablet (500 mg total) by mouth 2 (two) times daily with a meal., Disp: 180 tablet, Rfl: 3    mupirocin ointment (BACTROBAN) 2 %, Apply 1 Application topically 2 (two) times daily., Disp: 22 g, Rfl: 5   omeprazole (PRILOSEC) 40 MG capsule, TAKE 1 CAPSULE BY MOUTH EVERY DAY, Disp: 90 capsule, Rfl: 3   polyethylene glycol (MIRALAX / GLYCOLAX) 17 g packet, Take 17 g by mouth daily as needed for mild constipation., Disp: , Rfl:    potassium chloride (KLOR-CON) 10 MEQ tablet, Take 1 tablet (10 mEq total) by mouth daily., Disp: 90 tablet, Rfl: 3   RESTASIS 0.05 % ophthalmic emulsion, Place 1 drop into both eyes 2 (two) times daily., Disp: , Rfl:    sitaGLIPtin (JANUVIA) 100 MG tablet, Take 1 tablet (100 mg total) by mouth daily., Disp: 90 tablet, Rfl: 3   amLODipine (NORVASC) 10 MG tablet, Take 1 tablet (10 mg total) by mouth daily., Disp: 90 tablet, Rfl: 3   atorvastatin (LIPITOR) 10 MG tablet, Take 1 tablet (10 mg total) by mouth daily., Disp: 90 tablet, Rfl: 3   HYDROcodone-acetaminophen (NORCO) 10-325 MG tablet, Take 1 tablet by mouth every 6 (six) hours as needed for moderate pain (pain score 4-6)., Disp: 120 tablet, Rfl: 0   HYDROcodone-acetaminophen (NORCO) 10-325 MG tablet, Take 1 tablet by mouth every 6 (six) hours as needed for moderate pain (pain score 4-6)., Disp: 120 tablet, Rfl: 0   HYDROcodone-acetaminophen (NORCO) 10-325 MG tablet, Take 1 tablet by mouth every 6 (six) hours as needed for moderate pain (pain score 4-6)., Disp: 120 tablet, Rfl: 0   ondansetron (ZOFRAN) 4 MG tablet, Take 1 tablet (4 mg total) by mouth every 8 (eight) hours as needed for nausea or vomiting., Disp: 90 tablet, Rfl: 3  EXAM:  VITALS per patient if applicable:  GENERAL: alert, oriented, appears well and in no acute distress  HEENT: atraumatic, conjunttiva clear, no obvious abnormalities on inspection of external nose and ears  NECK: normal movements of the head and neck  LUNGS: on inspection no signs of respiratory distress, breathing rate appears normal, no obvious gross SOB, gasping or  wheezing  CV: no obvious cyanosis  MS: moves all visible extremities without noticeable abnormality  PSYCH/NEURO: pleasant and cooperative, no obvious depression or anxiety, speech and thought processing grossly intact  ASSESSMENT AND PLAN: Pain management. Indication for chronic opioid: low back pain Medication and dose: Norco 10-325 # pills per month: 120 Last UDS date: 12-17-22 Opioid Treatment Agreement signed (Y/N): 06-19-17 Opioid Treatment Agreement last reviewed with patient:  03-24-23 [facility] reviewed this encounter (include red flags): Yes Meds were refilled.  Gershon Crane, MD  Discussed the following assessment and plan:  No diagnosis found.     I discussed the assessment and treatment plan with the patient. The patient was provided an opportunity to ask questions and all were answered. The patient agreed with the plan and demonstrated an understanding of the instructions.   The patient was advised to call back or seek an in-person evaluation if the symptoms worsen or if the condition fails to improve as anticipated.      Review of Systems     Objective:   Physical Exam        Assessment & Plan:

## 2023-03-28 ENCOUNTER — Other Ambulatory Visit: Payer: Self-pay | Admitting: Family Medicine

## 2023-04-01 ENCOUNTER — Ambulatory Visit (HOSPITAL_COMMUNITY)
Admission: RE | Admit: 2023-04-01 | Discharge: 2023-04-01 | Disposition: A | Discharge status: Home / Self Care | Payer: Medicare HMO | Source: Ambulatory Visit | Attending: Emergency Medicine | Admitting: Emergency Medicine

## 2023-04-01 ENCOUNTER — Encounter (HOSPITAL_COMMUNITY): Payer: Self-pay

## 2023-04-01 DIAGNOSIS — R590 Localized enlarged lymph nodes: Secondary | ICD-10-CM | POA: Diagnosis not present

## 2023-04-01 DIAGNOSIS — R911 Solitary pulmonary nodule: Secondary | ICD-10-CM | POA: Diagnosis not present

## 2023-04-01 DIAGNOSIS — R918 Other nonspecific abnormal finding of lung field: Secondary | ICD-10-CM | POA: Diagnosis not present

## 2023-04-01 DIAGNOSIS — I700 Atherosclerosis of aorta: Secondary | ICD-10-CM | POA: Diagnosis not present

## 2023-04-06 ENCOUNTER — Other Ambulatory Visit: Payer: Medicare HMO

## 2023-04-06 ENCOUNTER — Other Ambulatory Visit: Payer: Self-pay

## 2023-04-06 ENCOUNTER — Encounter (HOSPITAL_COMMUNITY): Payer: Self-pay | Admitting: Emergency Medicine

## 2023-04-06 NOTE — Progress Notes (Signed)
SDW call  Patient was given pre-op instructions over the phone. Patient verbalized understanding of instructions provided.     PCP - Dr. Gershon Crane Cardiologist -  Pulmonary:    PPM/ICD - denies Device Orders - na Rep Notified - na   Chest x-ray - 07/30/2022 EKG -  DOS 04/07/2023 Stress Test - ECHO -  Cardiac Cath -   Sleep Study/sleep apnea/CPAP: Denies  Type II Diabetes Fasting Blood sugar range: 190-242 How often check sugars: twice a day Glipizide, instructed to hold the night before surgery and day of surgery Metformin, instructed to hold the day of surgery Januvia, instructed to hold day of surgery   Blood Thinner Instructions: denies Aspirin Instructions:denies   ERAS Protcol - NPO   Anesthesia review: Yes.  HTN, DM, Hep C +, last A1C 9.6   Patient denies shortness of breath, fever, cough and chest pain over the phone call  Your procedure is scheduled on Tuesday April 07, 2023  Report to [facility] Main Entrance "A" at  1015   A.M., then check in with the Admitting office.  Call this number if you have problems the morning of surgery:  [phone]   If you have any questions prior to your surgery date call [phone]: Open Monday-Friday 8am-4pm If you experience any cold or flu symptoms such as cough, fever, chills, shortness of breath, etc. between now and your scheduled surgery, please notify us at the above number    Remember:  Do not eat or drink after midnight the night before your surgery  Take these medicines the morning of surgery with A SIP OF WATER:  Amlodipiine, atorvastatin, flonase, plaquenil, omeprazole, restasis  As needed: Norco, zofran  As of today, STOP taking any Aspirin (unless otherwise instructed by your surgeon) Aleve, Naproxen, Ibuprofen, Motrin, Advil, Goody's, BC's, all herbal medications, fish oil, and all vitamins.

## 2023-04-07 ENCOUNTER — Ambulatory Visit (HOSPITAL_COMMUNITY): Payer: Medicare HMO

## 2023-04-07 ENCOUNTER — Encounter (HOSPITAL_COMMUNITY): Payer: Self-pay | Admitting: Emergency Medicine

## 2023-04-07 ENCOUNTER — Ambulatory Visit (HOSPITAL_COMMUNITY)
Admission: RE | Admit: 2023-04-07 | Discharge: 2023-04-07 | Disposition: A | Discharge status: Home / Self Care | Payer: Medicare HMO | Attending: Emergency Medicine | Admitting: Emergency Medicine

## 2023-04-07 ENCOUNTER — Ambulatory Visit (HOSPITAL_BASED_OUTPATIENT_CLINIC_OR_DEPARTMENT_OTHER): Payer: Self-pay | Admitting: Physician Assistant

## 2023-04-07 ENCOUNTER — Other Ambulatory Visit: Payer: Self-pay

## 2023-04-07 ENCOUNTER — Encounter (HOSPITAL_COMMUNITY)
Admission: RE | Disposition: A | Discharge status: Home / Self Care | Payer: Self-pay | Source: Home / Self Care | Attending: Emergency Medicine

## 2023-04-07 ENCOUNTER — Ambulatory Visit (HOSPITAL_COMMUNITY): Payer: Self-pay | Admitting: Physician Assistant

## 2023-04-07 DIAGNOSIS — Z8619 Personal history of other infectious and parasitic diseases: Secondary | ICD-10-CM | POA: Diagnosis not present

## 2023-04-07 DIAGNOSIS — E1165 Type 2 diabetes mellitus with hyperglycemia: Secondary | ICD-10-CM | POA: Insufficient documentation

## 2023-04-07 DIAGNOSIS — I10 Essential (primary) hypertension: Secondary | ICD-10-CM | POA: Diagnosis not present

## 2023-04-07 DIAGNOSIS — M069 Rheumatoid arthritis, unspecified: Secondary | ICD-10-CM | POA: Diagnosis not present

## 2023-04-07 DIAGNOSIS — Z87891 Personal history of nicotine dependence: Secondary | ICD-10-CM | POA: Diagnosis not present

## 2023-04-07 DIAGNOSIS — Z7984 Long term (current) use of oral hypoglycemic drugs: Secondary | ICD-10-CM | POA: Diagnosis not present

## 2023-04-07 DIAGNOSIS — R590 Localized enlarged lymph nodes: Secondary | ICD-10-CM

## 2023-04-07 DIAGNOSIS — C3431 Malignant neoplasm of lower lobe, right bronchus or lung: Secondary | ICD-10-CM | POA: Insufficient documentation

## 2023-04-07 DIAGNOSIS — R911 Solitary pulmonary nodule: Secondary | ICD-10-CM | POA: Diagnosis not present

## 2023-04-07 DIAGNOSIS — Z48813 Encounter for surgical aftercare following surgery on the respiratory system: Secondary | ICD-10-CM | POA: Diagnosis not present

## 2023-04-07 DIAGNOSIS — C3490 Malignant neoplasm of unspecified part of unspecified bronchus or lung: Secondary | ICD-10-CM | POA: Diagnosis not present

## 2023-04-07 DIAGNOSIS — Z79899 Other long term (current) drug therapy: Secondary | ICD-10-CM | POA: Insufficient documentation

## 2023-04-07 DIAGNOSIS — C969 Malignant neoplasm of lymphoid, hematopoietic and related tissue, unspecified: Secondary | ICD-10-CM

## 2023-04-07 DIAGNOSIS — J9811 Atelectasis: Secondary | ICD-10-CM | POA: Diagnosis not present

## 2023-04-07 DIAGNOSIS — Z1152 Encounter for screening for COVID-19: Secondary | ICD-10-CM | POA: Diagnosis not present

## 2023-04-07 DIAGNOSIS — I700 Atherosclerosis of aorta: Secondary | ICD-10-CM | POA: Diagnosis not present

## 2023-04-07 HISTORY — DX: Inflammatory liver disease, unspecified: K75.9

## 2023-04-07 HISTORY — PX: BRONCHIAL BRUSHINGS: SHX5108

## 2023-04-07 HISTORY — DX: Other specified postprocedural states: Z98.890

## 2023-04-07 HISTORY — PX: BRONCHIAL NEEDLE ASPIRATION BIOPSY: SHX5106

## 2023-04-07 HISTORY — DX: Other complications of anesthesia, initial encounter: T88.59XA

## 2023-04-07 HISTORY — DX: Nausea with vomiting, unspecified: R11.2

## 2023-04-07 HISTORY — PX: VIDEO BRONCHOSCOPY WITH ENDOBRONCHIAL ULTRASOUND: SHX6177

## 2023-04-07 HISTORY — PX: FINE NEEDLE ASPIRATION: SHX5430

## 2023-04-07 LAB — RESP PANEL BY RT-PCR (RSV, FLU A&B, COVID)  RVPGX2
Influenza A by PCR: NEGATIVE
Influenza B by PCR: NEGATIVE
Resp Syncytial Virus by PCR: NEGATIVE
SARS Coronavirus 2 by RT PCR: NEGATIVE

## 2023-04-07 LAB — POCT I-STAT, CHEM 8
BUN: 17 mg/dL (ref 8–23)
Calcium, Ion: 1.06 mmol/L — ABNORMAL LOW (ref 1.15–1.40)
Chloride: 104 mmol/L (ref 98–111)
Creatinine, Ser: 0.9 mg/dL (ref 0.44–1.00)
Glucose, Bld: 298 mg/dL — ABNORMAL HIGH (ref 70–99)
HCT: 48 % — ABNORMAL HIGH (ref 36.0–46.0)
Hemoglobin: 16.3 g/dL — ABNORMAL HIGH (ref 12.0–15.0)
Potassium: 3.8 mmol/L (ref 3.5–5.1)
Sodium: 139 mmol/L (ref 135–145)
TCO2: 24 mmol/L (ref 22–32)

## 2023-04-07 LAB — SURGICAL PCR SCREEN
MRSA, PCR: NEGATIVE
Staphylococcus aureus: NEGATIVE

## 2023-04-07 LAB — GLUCOSE, CAPILLARY
Glucose-Capillary: 257 mg/dL — ABNORMAL HIGH (ref 70–99)
Glucose-Capillary: 188 mg/dL — ABNORMAL HIGH (ref 70–99)

## 2023-04-07 SURGERY — BRONCHOSCOPY, WITH BIOPSY USING ELECTROMAGNETIC NAVIGATION
Anesthesia: General

## 2023-04-07 MED ORDER — SODIUM CHLORIDE 0.9 % IV SOLN: 8.0000 mg | Freq: Once | INTRAVENOUS | Status: DC

## 2023-04-07 MED ORDER — ROCURONIUM BROMIDE 10 MG/ML (PF) SYRINGE
PREFILLED_SYRINGE | INTRAVENOUS | Status: DC | PRN
  Administered 2023-04-07: 40 mg via INTRAVENOUS
  Administered 2023-04-07: 10 mg via INTRAVENOUS

## 2023-04-07 MED ORDER — ONDANSETRON HCL 4 MG/2ML IJ SOLN
INTRAMUSCULAR | Status: DC | PRN
  Administered 2023-04-07: 4 mg via INTRAVENOUS

## 2023-04-07 MED ORDER — ONDANSETRON HCL 4 MG/2ML IJ SOLN
4.0000 mg | Freq: Once | INTRAMUSCULAR | Status: DC
Start: 2023-04-07 — End: 2023-04-07

## 2023-04-07 MED ORDER — SODIUM CHLORIDE 0.9 % IV SOLN
INTRAVENOUS | Status: DC
Start: 2023-04-07 — End: 2023-04-07

## 2023-04-07 MED ORDER — AMISULPRIDE (ANTIEMETIC) 5 MG/2ML IV SOLN
INTRAVENOUS | Status: AC
  Administered 2023-04-07: 5 mg via INTRAVENOUS
  Filled 2023-04-07: qty 2

## 2023-04-07 MED ORDER — SITAGLIPTIN PHOSPHATE 100 MG PO TABS: 100.0000 mg | ORAL_TABLET | Freq: Every evening | ORAL | Status: DC

## 2023-04-07 MED ORDER — ONDANSETRON 4 MG PO TBDP
4.0000 mg | ORAL_TABLET | Freq: Once | ORAL | Status: DC
Start: 2023-04-07 — End: 2023-04-07

## 2023-04-07 MED ORDER — DULOXETINE HCL 30 MG PO CPEP: 30.0000 mg | ORAL_CAPSULE | Freq: Every evening | ORAL | Status: DC

## 2023-04-07 MED ORDER — ACETAMINOPHEN 10 MG/ML IV SOLN
1000.0000 mg | Freq: Once | INTRAVENOUS | Status: DC | PRN
  Filled 2023-04-07: qty 100

## 2023-04-07 MED ORDER — PHENYLEPHRINE 80 MCG/ML (10ML) SYRINGE FOR IV PUSH (FOR BLOOD PRESSURE SUPPORT)
PREFILLED_SYRINGE | INTRAVENOUS | Status: DC | PRN
  Administered 2023-04-07 (×3): 160 ug via INTRAVENOUS
  Administered 2023-04-07 (×2): 80 ug via INTRAVENOUS
  Administered 2023-04-07: 160 ug via INTRAVENOUS

## 2023-04-07 MED ORDER — FENTANYL CITRATE (PF) 100 MCG/2ML IJ SOLN: 25.0000 ug | INTRAMUSCULAR | Status: DC | PRN

## 2023-04-07 MED ORDER — FENTANYL CITRATE (PF) 250 MCG/5ML IJ SOLN
INTRAMUSCULAR | Status: DC | PRN
  Administered 2023-04-07 (×4): 25 ug via INTRAVENOUS

## 2023-04-07 MED ORDER — ONDANSETRON HCL 4 MG/2ML IJ SOLN
INTRAMUSCULAR | Status: AC
  Filled 2023-04-07: qty 2

## 2023-04-07 MED ORDER — AMISULPRIDE (ANTIEMETIC) 5 MG/2ML IV SOLN: 5.0000 mg | Freq: Once | INTRAVENOUS | Status: AC

## 2023-04-07 MED ORDER — PROPOFOL 500 MG/50ML IV EMUL
INTRAVENOUS | Status: DC | PRN
  Administered 2023-04-07: 150 ug/kg/min via INTRAVENOUS

## 2023-04-07 MED ORDER — ACETAMINOPHEN 10 MG/ML IV SOLN
500.0000 mg | Freq: Once | INTRAVENOUS | Status: AC
  Administered 2023-04-07: 500 mg via INTRAVENOUS
  Filled 2023-04-07: qty 100

## 2023-04-07 MED ORDER — INSULIN ASPART 100 UNIT/ML IJ SOLN
0.0000 [IU] | INTRAMUSCULAR | Status: DC | PRN
  Administered 2023-04-07: 4 [IU] via SUBCUTANEOUS
  Filled 2023-04-07: qty 1

## 2023-04-07 MED ORDER — LIDOCAINE 2% (20 MG/ML) 5 ML SYRINGE
INTRAMUSCULAR | Status: DC | PRN
  Administered 2023-04-07: 80 mg via INTRAVENOUS

## 2023-04-07 MED ORDER — CHLORHEXIDINE GLUCONATE 0.12 % MT SOLN: 15.0000 mL | Freq: Once | OROMUCOSAL | Status: AC

## 2023-04-07 MED ORDER — SUCCINYLCHOLINE CHLORIDE 200 MG/10ML IV SOSY
PREFILLED_SYRINGE | INTRAVENOUS | Status: DC | PRN
  Administered 2023-04-07: 160 mg via INTRAVENOUS

## 2023-04-07 MED ORDER — DEXAMETHASONE SODIUM PHOSPHATE 10 MG/ML IJ SOLN
INTRAMUSCULAR | Status: DC | PRN
  Administered 2023-04-07: 10 mg via INTRAVENOUS

## 2023-04-07 MED ORDER — SUGAMMADEX SODIUM 200 MG/2ML IV SOLN
INTRAVENOUS | Status: DC | PRN
  Administered 2023-04-07: 200 mg via INTRAVENOUS

## 2023-04-07 MED ORDER — PROPOFOL 10 MG/ML IV BOLUS
INTRAVENOUS | Status: DC | PRN
  Administered 2023-04-07: 130 mg via INTRAVENOUS

## 2023-04-07 MED ORDER — CHLORHEXIDINE GLUCONATE 0.12 % MT SOLN
OROMUCOSAL | Status: AC
  Administered 2023-04-07: 15 mL via OROMUCOSAL
  Filled 2023-04-07: qty 15

## 2023-04-07 NOTE — Anesthesia Procedure Notes (Signed)
 Procedure Name: Intubation Date/Time: 04/07/2023 12:41 PM  Performed by: Loreli Blima LABOR, CRNAPre-anesthesia Checklist: Patient identified, Emergency Drugs available, Suction available and Patient being monitored Patient Re-evaluated:Patient Re-evaluated prior to induction Oxygen Delivery Method: Circle system utilized Preoxygenation: Pre-oxygenation with 100% oxygen Induction Type: IV induction and Rapid sequence Laryngoscope Size: Mac and 3 Grade View: Grade I Tube type: Oral Tube size: 8.5 mm Number of attempts: 1 Airway Equipment and Method: Stylet Placement Confirmation: ETT inserted through vocal cords under direct vision, positive ETCO2 and breath sounds checked- equal and bilateral Secured at: 21 cm Tube secured with: Tape Dental Injury: Teeth and Oropharynx as per pre-operative assessment

## 2023-04-07 NOTE — Op Note (Signed)
 Video Bronchoscopy with Robotic Assisted Bronchoscopic Navigation and Endobronchial Ultrasound Procedure Note  Date of Operation: 04/07/2023   Pre-op Diagnosis: Right lower lobe nodule, mediastinal adenopathy  Post-op Diagnosis: Same  Surgeon: Lamar Chris  Assistants: None  Anesthesia: General endotracheal anesthesia  Operation: Flexible video fiberoptic bronchoscopy with robotic assistance and biopsies.  Estimated Blood Loss: Minimal  Complications: None  Indications and History: Wendy [name] is a 78 y.o. female with history of tobacco use, rheumatoid arthritis, hypertension, diabetes.  She was found to have an enlarging right lower lobe pulmonary nodule and some mediastinal adenopathy on CT chest.  Recommendation made to achieve a tissue diagnosis via robotic assisted navigational bronchoscopy and endobronchial ultrasound. The risks, benefits, complications, treatment options and expected outcomes were discussed with the patient.  The possibilities of pneumothorax, pneumonia, reaction to medication, pulmonary aspiration, perforation of a viscus, bleeding, failure to diagnose a condition and creating a complication requiring transfusion or operation were discussed with the patient who freely signed the consent.    Description of Procedure: The patient was seen in the Preoperative Area, was examined and was deemed appropriate to proceed.  The patient was taken to [facility] endoscopy room 3, identified as Wendy [name] and the procedure verified as Flexible Video Fiberoptic Bronchoscopy.  A Time Out was held and the above information confirmed.   Prior to the date of the procedure a high-resolution CT scan of the chest was performed. Utilizing ION software program a virtual tracheobronchial tree was generated to allow the creation of distinct navigation pathways to the patient's parenchymal abnormalities. After being taken to the operating room general anesthesia was initiated and the patient   was orally intubated. The video fiberoptic bronchoscope was introduced via the endotracheal tube and a general inspection was performed which showed normal right and left lung anatomy. Aspiration of the bilateral mainstems was completed to remove any remaining secretions. Robotic catheter inserted into patient's endotracheal tube.   Target #1 right lower lobe pulmonary nodule: The distinct navigation pathways prepared prior to this procedure were then utilized to navigate to patient's lesion identified on CT scan. The robotic catheter was secured into place and the vision probe was withdrawn.  Lesion location was approximated using fluoroscopy.  Local registration and targeting was performed using Cios three-dimensional imaging. Under fluoroscopic guidance transbronchial needle brushings from and transbronchial needle biopsies were performed to be sent for cytology and pathology.   The robotic scope was then withdrawn and the endobronchial ultrasound was used to identify and characterize the peritracheal, hilar and bronchial lymph nodes. Inspection showed enlargement at station 4R.  There were no other pathologic nodes seen. Using real-time ultrasound guidance Wang needle biopsies were take from Station 4R node to be sent for cytology.   At the end of the procedure a general airway inspection was performed and there was no evidence of active bleeding. The bronchoscope was removed.  The patient tolerated the procedure well. There was no significant blood loss and there were no obvious complications. A post-procedural chest x-ray is pending.  Samples Target #1: 1. Transbronchial needle brushings from right lower lobe pulmonary nodule 2. Transbronchial Wang needle biopsies from right lower lobe pulmonary nodule  EBUS samples: 1. Wang needle biopsies from 4R node   Plans:  The patient will be discharged from the PACU to home when recovered from anesthesia and after chest x-ray is reviewed. We will  review the cytology, pathology and microbiology results with the patient when they become available. Outpatient followup will  be with Wendy Lites, NP.    Lamar Chris, MD, PhD 04/07/2023, 1:49 PM [facility] [phone] or if no answer before 7:00PM call [phone] For any issues after 7:00PM please call eLink [phone]

## 2023-04-07 NOTE — Anesthesia Preprocedure Evaluation (Addendum)
Anesthesia Evaluation  Patient identified by MRN, date of birth, ID band Patient awake    Reviewed: Allergy & Precautions, NPO status , Patient's Chart, lab work & pertinent test results  History of Anesthesia Complications (+) PONV and history of anesthetic complications  Airway Mallampati: II  TM Distance: >3 FB Neck ROM: Full    Dental no notable dental hx. (+) Upper Dentures, Partial Lower   Pulmonary Patient abstained from smoking., former smoker RLL nodule     + decreased breath sounds      Cardiovascular hypertension, Pt. on medications  Rhythm:Regular Rate:Normal     Neuro/Psych   Anxiety     negative neurological ROS     GI/Hepatic ,GERD  Medicated,,(+) Hepatitis -, C  Endo/Other  diabetes, Poorly Controlled, Type 2, Oral Hypoglycemic Agents    Renal/GU negative Renal ROS  negative genitourinary   Musculoskeletal  (+) Arthritis , Osteoarthritis,    Abdominal Normal abdominal exam  (+)   Peds  Hematology  (+) Blood dyscrasia, anemia   Anesthesia Other Findings   Reproductive/Obstetrics                             Anesthesia Physical Anesthesia Plan  ASA: 3  Anesthesia Plan: General   Post-op Pain Management:    Induction: Intravenous  PONV Risk Score and Plan: 4 or greater and Ondansetron, Dexamethasone and Treatment may vary due to age or medical condition  Airway Management Planned: Mask and Oral ETT  Additional Equipment: None  Intra-op Plan:   Post-operative Plan: Extubation in OR  Informed Consent: I have reviewed the patients History and Physical, chart, labs and discussed the procedure including the risks, benefits and alternatives for the proposed anesthesia with the patient or authorized representative who has indicated his/her understanding and acceptance.     Dental advisory given  Plan Discussed with: CRNA  Anesthesia Plan Comments:         Anesthesia Quick Evaluation

## 2023-04-07 NOTE — Interval H&P Note (Signed)
 History and Physical Interval Note:  04/07/2023 11:48 AM  Wendy [name]  has presented today for surgery, with the diagnosis of RIGHT LOWER LOPE NODULE.  The various methods of treatment have been discussed with the patient and family. After consideration of risks, benefits and other options for treatment, the patient has consented to  Procedure(s): ROBOTIC ASSISTED NAVIGATIONAL BRONCHOSCOPY (N/A) VIDEO BRONCHOSCOPY WITH ENDOBRONCHIAL ULTRASOUND (N/A) as a surgical intervention.    She was Anoro nodule and episode of emesis this morning.  Also reports diarrhea on 2/3, none today.  Finally has had some mild URI symptoms, no known sick contacts.  No fever.  We will plan to test her for RSV/COVID/flu.  If negative then we can proceed with bronchoscopy.  If positive then we will reschedule her.  Questions were answered to the patient's satisfaction.     Lamar GORMAN Chris

## 2023-04-07 NOTE — Anesthesia Postprocedure Evaluation (Signed)
 Anesthesia Post Note  Patient: TAUSHA MILHOAN  Procedure(s) Performed: ROBOTIC ASSISTED NAVIGATIONAL BRONCHOSCOPY VIDEO BRONCHOSCOPY WITH ENDOBRONCHIAL ULTRASOUND BRONCHIAL NEEDLE ASPIRATION BIOPSIES BRONCHIAL BRUSHINGS     Patient location during evaluation: PACU Anesthesia Type: General Level of consciousness: awake and alert Pain management: pain level controlled Vital Signs Assessment: post-procedure vital signs reviewed and stable Respiratory status: spontaneous breathing, nonlabored ventilation, respiratory function stable and patient connected to nasal cannula oxygen Cardiovascular status: blood pressure returned to baseline and stable Postop Assessment: no apparent nausea or vomiting Anesthetic complications: no   No notable events documented.  Last Vitals:  Vitals:   04/07/23 1445 04/07/23 1500  BP: (!) 145/79 139/80  Pulse: 88 89  Resp: 16 16  Temp:  37.4 C  SpO2: 97% 96%    Last Pain:  Vitals:   04/07/23 1415  TempSrc:   PainSc: 0-No pain                 Cordella P Dotty Gonzalo

## 2023-04-07 NOTE — Progress Notes (Signed)
 Pt arrived to pre-op complaining of nausea and vomiting. ST 110 on EKG. Temp 99.9 orally. Pt reports that she did have some diarrhea last night. Dr Shelah and Dr Dorethea assessed patient. Flu/COVID/RSV swab sent to lab. Pt given nausea medication and tylenol  per MD order. Pt resting in room.

## 2023-04-07 NOTE — Transfer of Care (Signed)
 Immediate Anesthesia Transfer of Care Note  Patient: Wendy [name]  Procedure(s) Performed: ROBOTIC ASSISTED NAVIGATIONAL BRONCHOSCOPY VIDEO BRONCHOSCOPY WITH ENDOBRONCHIAL ULTRASOUND BRONCHIAL NEEDLE ASPIRATION BIOPSIES BRONCHIAL BRUSHINGS  Patient Location: PACU  Anesthesia Type:General  Level of Consciousness: awake  Airway & Oxygen Therapy: Patient Spontanous Breathing  Post-op Assessment: Report given to RN and Post -op Vital signs reviewed and stable  Post vital signs: Reviewed and stable  Last Vitals:  Vitals Value Taken Time  BP 120/84 04/07/23 1345  Temp    Pulse 85 04/07/23 1348  Resp 14 04/07/23 1348  SpO2 96 % 04/07/23 1348  Vitals shown include unfiled device data.  Last Pain:  Vitals:   04/07/23 1101  TempSrc: Oral  PainSc: 0-No pain         Complications: No notable events documented.

## 2023-04-07 NOTE — Discharge Instructions (Signed)
 Flexible Bronchoscopy, Care After This sheet gives you information about how to care for yourself after your test. Your doctor may also give you more specific instructions. If you have problems or questions, contact your doctor. Follow these instructions at home: Eating and drinking When your numbness is gone and your cough and gag reflexes have come back, you may: Eat only soft foods. Slowly drink liquids. When you get home after the test, go back to your normal diet. Driving Do not drive for 24 hours if you were given a medicine to help you relax (sedative). Do not drive or use heavy machinery while taking prescription pain medicine. General instructions  Take over-the-counter and prescription medicines only as told by your doctor. Return to your normal activities as told. Ask what activities are safe for you. Do not use any products that have nicotine or tobacco in them. This includes cigarettes and e-cigarettes. If you need help quitting, ask your doctor. Keep all follow-up visits as told by your doctor. This is important. It is very important if you had a tissue sample (biopsy) taken. Get help right away if: You have shortness of breath that gets worse. You get light-headed. You feel like you are going to pass out (faint). You have chest pain. You cough up: More than a little blood. More blood than before. Summary Do not eat or drink anything (not even water) for 2 hours after your test, or until your numbing medicine wears off. Do not use cigarettes. Do not use e-cigarettes. Get help right away if you have chest pain.  Please call our office for any questions or concerns.  [phone].  This information is not intended to replace advice given to you by your health care provider. Make sure you discuss any questions you have with your health care provider. Document Released: 12/15/2008 Document Revised: 01/30/2017 Document Reviewed: 03/07/2016 Elsevier Patient Education  2020  [employer].

## 2023-04-08 ENCOUNTER — Encounter (HOSPITAL_COMMUNITY): Payer: Self-pay | Admitting: Emergency Medicine

## 2023-04-10 ENCOUNTER — Telehealth: Payer: Self-pay | Admitting: Emergency Medicine

## 2023-04-10 LAB — CYTOLOGY - NON PAP

## 2023-04-10 NOTE — Telephone Encounter (Signed)
 Dr. Legolvan would like to speak to Dr. Baldwin Levee. Dr. Avel Leiter phone number is [phone].

## 2023-04-10 NOTE — Telephone Encounter (Signed)
 Dr. Legolvan wanted to make our office aware that he will be releasing cytology results.  Dr. Baldwin Levee is out of the office until 2/10. Sarah, please advise. Thanks

## 2023-04-10 NOTE — Telephone Encounter (Signed)
 Noted.

## 2023-04-10 NOTE — Telephone Encounter (Signed)
 Thank you, we will review with the patient

## 2023-04-11 ENCOUNTER — Other Ambulatory Visit: Payer: Self-pay | Admitting: Family Medicine

## 2023-04-14 DIAGNOSIS — C3490 Malignant neoplasm of unspecified part of unspecified bronchus or lung: Secondary | ICD-10-CM | POA: Diagnosis not present

## 2023-04-15 ENCOUNTER — Ambulatory Visit: Payer: Medicare HMO | Admitting: Acute Care

## 2023-04-15 ENCOUNTER — Encounter: Payer: Self-pay | Admitting: Acute Care

## 2023-04-15 ENCOUNTER — Telehealth: Payer: Self-pay | Admitting: Radiation Oncology

## 2023-04-15 VITALS — BP 130/60 | HR 63 | Temp 97.6°F | Ht 63.0 in | Wt 183.6 lb

## 2023-04-15 DIAGNOSIS — F17200 Nicotine dependence, unspecified, uncomplicated: Secondary | ICD-10-CM

## 2023-04-15 DIAGNOSIS — D62 Acute posthemorrhagic anemia: Secondary | ICD-10-CM | POA: Diagnosis not present

## 2023-04-15 DIAGNOSIS — D849 Immunodeficiency, unspecified: Secondary | ICD-10-CM

## 2023-04-15 DIAGNOSIS — B1710 Acute hepatitis C without hepatic coma: Secondary | ICD-10-CM

## 2023-04-15 DIAGNOSIS — E119 Type 2 diabetes mellitus without complications: Secondary | ICD-10-CM

## 2023-04-15 DIAGNOSIS — M069 Rheumatoid arthritis, unspecified: Secondary | ICD-10-CM

## 2023-04-15 DIAGNOSIS — C3490 Malignant neoplasm of unspecified part of unspecified bronchus or lung: Secondary | ICD-10-CM | POA: Diagnosis not present

## 2023-04-15 DIAGNOSIS — Z9889 Other specified postprocedural states: Secondary | ICD-10-CM

## 2023-04-15 NOTE — Telephone Encounter (Signed)
LVM to schedule CON with Dr. Mitzi Hansen asap.

## 2023-04-15 NOTE — Patient Instructions (Signed)
It is good to see today I am glad you have done well after the bronchoscopy with biopsies. 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 I have referred you to radiation oncology, medical oncology, and thoracic surgery to be evaluated by each specialty to determine best options for treatment. You will get several phone calls to get these appointments scheduled. As you have had a recent CT of the head I have not ordered an MRI of the brain however 1 of these physicians may order it if he feels it is needed. Please let us know if you need [place] Good luck with your treatment Please contact office for sooner follow up if symptoms do not improve or worsen or seek emergency care
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
## 2023-04-16 ENCOUNTER — Ambulatory Visit: Payer: Medicare HMO | Admitting: Orthopedic Surgery

## 2023-04-16 ENCOUNTER — Telehealth: Payer: Self-pay | Admitting: Radiation Oncology

## 2023-04-16 ENCOUNTER — Other Ambulatory Visit: Payer: Self-pay

## 2023-04-16 ENCOUNTER — Other Ambulatory Visit (INDEPENDENT_AMBULATORY_CARE_PROVIDER_SITE_OTHER): Payer: Self-pay

## 2023-04-16 DIAGNOSIS — R911 Solitary pulmonary nodule: Secondary | ICD-10-CM | POA: Diagnosis not present

## 2023-04-16 DIAGNOSIS — I10 Essential (primary) hypertension: Secondary | ICD-10-CM | POA: Diagnosis not present

## 2023-04-16 DIAGNOSIS — Z981 Arthrodesis status: Secondary | ICD-10-CM

## 2023-04-16 DIAGNOSIS — E119 Type 2 diabetes mellitus without complications: Secondary | ICD-10-CM | POA: Diagnosis not present

## 2023-04-16 NOTE — Progress Notes (Signed)
Orthopedic Surgery Office Visit   Procedure: L2/3 and L3/4 XLIF, L1-4 laminectomies, T10-pelvis PSIF Date of Surgery: 07/29/2022 (~9 months post-op)   Assessment: Patient is a 78 y.o. who is doing well after surgery     Plan: -No spine specific restrictions -Activity as tolerated -Pain control: OTC medications -Return to office in 3 months, x-rays needed at next visit: AP/lateral/flex/ex lumbar   ___________________________________________________________________________     Subjective: Patient continues to do well after surgery.  Her back pain is well-controlled right now.  She denies having significant radiating leg pain.  She is ambulating with a cane. Her biggest issue right now is that she is undergoing workup for lung cancer.  She is concerned about this and was preoccupied a lot of her time thoughts.   Objective:   General: no acute distress, appropriate affect Neurologic: alert, answering questions appropriately, following commands Respiratory: unlabored breathing on room air Skin: incisions are well healed   MSK (spine):   -Strength exam                                                   Left                  Right   EHL                              5/5                  5/5 TA                                 5/5                  5/5 GSC                             5/5                  5/5 Knee extension            5/5                  5/5 Hip flexion                    4/5                  5/5   -Sensory exam                           Sensation intact to light touch in L3-S1 nerve distributions of bilateral lower extremities (decreased in left anterior thigh)    Imaging: XRs of the lumbar spine taken 04/16/2023 were independently reviewed and interpreted, showing instrumentation from T10-pelvis. Screws appear in appropriate position. No lucency seen around the screws. No kyphosis seen above her fusion. No evidence of instability or translation seen on  flexion/extension views. No fracture or dislocation seen. Interbody devices at L2/3 and L3/4 are in satisfactory position.      Patient name: Wendy [name] Patient MRN: [id number] Date of visit: 04/16/23

## 2023-04-16 NOTE — Telephone Encounter (Signed)
Spoke to daughter to schedule consultation. Daughter agreed to consultation at this time but stated they may change their mind since they have not decided on tx plan yet. She was encouraged to keep appt since this consultation is to provide them with information needed to help them make an educated decision. She verbalized understanding and advised she would call us if needed.

## 2023-04-17 ENCOUNTER — Encounter (HOSPITAL_COMMUNITY): Payer: Self-pay

## 2023-04-17 NOTE — Progress Notes (Signed)
The proposed treatment discussed in conference is for discussion purpose only and is not a binding recommendation.  The patients have not been physically examined, or presented with their treatment options.  Therefore, final treatment plans cannot be decided.

## 2023-04-18 ENCOUNTER — Other Ambulatory Visit: Payer: Self-pay | Admitting: Family Medicine

## 2023-04-21 ENCOUNTER — Encounter: Payer: Self-pay | Admitting: Endocrinology

## 2023-04-21 ENCOUNTER — Ambulatory Visit
Admission: RE | Admit: 2023-04-21 | Discharge: 2023-04-21 | Disposition: A | Discharge status: Home / Self Care | Payer: Medicare HMO | Source: Ambulatory Visit | Attending: Radiation Oncology | Admitting: Radiation Oncology

## 2023-04-21 ENCOUNTER — Other Ambulatory Visit: Payer: Self-pay

## 2023-04-21 ENCOUNTER — Telehealth: Payer: Self-pay | Admitting: Radiation Oncology

## 2023-04-21 ENCOUNTER — Ambulatory Visit: Payer: Medicare HMO

## 2023-04-21 ENCOUNTER — Ambulatory Visit: Payer: Medicare HMO | Admitting: Endocrinology

## 2023-04-21 ENCOUNTER — Encounter: Payer: Self-pay | Admitting: Radiation Oncology

## 2023-04-21 ENCOUNTER — Ambulatory Visit: Payer: Medicare HMO | Admitting: Radiation Oncology

## 2023-04-21 VITALS — BP 122/60 | HR 97 | Resp 20 | Ht 63.0 in | Wt 178.8 lb

## 2023-04-21 VITALS — Ht 63.0 in | Wt 183.0 lb

## 2023-04-21 DIAGNOSIS — C3431 Malignant neoplasm of lower lobe, right bronchus or lung: Secondary | ICD-10-CM

## 2023-04-21 DIAGNOSIS — Z7984 Long term (current) use of oral hypoglycemic drugs: Secondary | ICD-10-CM | POA: Diagnosis not present

## 2023-04-21 DIAGNOSIS — F17210 Nicotine dependence, cigarettes, uncomplicated: Secondary | ICD-10-CM | POA: Diagnosis not present

## 2023-04-21 DIAGNOSIS — M069 Rheumatoid arthritis, unspecified: Secondary | ICD-10-CM

## 2023-04-21 DIAGNOSIS — E1165 Type 2 diabetes mellitus with hyperglycemia: Secondary | ICD-10-CM

## 2023-04-21 DIAGNOSIS — Z794 Long term (current) use of insulin: Secondary | ICD-10-CM

## 2023-04-21 LAB — POCT GLYCOSYLATED HEMOGLOBIN (HGB A1C): Hemoglobin A1C: 10.7 % — AB (ref 4.0–5.6)

## 2023-04-21 MED ORDER — INSULIN GLARGINE 100 UNIT/ML SOLOSTAR PEN: 25.0000 [IU] | PEN_INJECTOR | Freq: Every day | SUBCUTANEOUS | 4 refills | Status: DC

## 2023-04-21 MED ORDER — INSULIN PEN NEEDLE 32G X 4 MM MISC: 3 refills | Status: AC

## 2023-04-21 MED ORDER — BLOOD GLUCOSE TEST VI STRP: 1.0000 | ORAL_STRIP | Freq: Three times a day (TID) | 3 refills | Status: AC

## 2023-04-21 MED ORDER — LANCET DEVICE MISC: 1.0000 | Freq: Three times a day (TID) | 0 refills | Status: AC

## 2023-04-21 MED ORDER — INSULIN PEN NEEDLE 32G X 4 MM MISC: 3 refills | Status: DC

## 2023-04-21 MED ORDER — BLOOD GLUCOSE MONITORING SUPPL DEVI: 1.0000 | Freq: Three times a day (TID) | 0 refills | Status: AC

## 2023-04-21 MED ORDER — LANCETS MISC. MISC: 1.0000 | Freq: Three times a day (TID) | 3 refills | Status: AC
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
## 2023-04-21 NOTE — Progress Notes (Cosign Needed Addendum)
Radiation Oncology         [phone] ________________________________  Initial Outpatient Consultation - Conducted via telephone at patient request.  I spoke with the patient to conduct this consult visit via telephone. The patient was notified in advance and was offered an in person or telemedicine meeting to allow for face to face communication but instead preferred to proceed with a telephone consult.   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   HISTORY OF PRESENT ILLNESS: Wendy [name] is a 78 y.o. female seen at the request of Dr. Delton Coombes for a newly diagnosed lung cancer.  It appears that the patient had imaging in the emergency department in November 2024 which identified a nodule in the right lower lobe.  Apparently in retrospect scans in October 2023 showed a nodule in the same area measuring about 1 cm.  When she went to the emergency room on 01/10/2023, the scan showed a 2.4 cm nodule in the superior segment of the right upper lobe with a lobular border.  A 7 mm subsolid nodule in the right upper lobe was noted and a 1 cm lymph node in the precarinal space was present but no additional adenopathy.  She was counseled on a PET scan which was performed on 02/05/2023, this showed the nodule in question is being called right lower lobe with hypermetabolic activity with an SUV of 10.7, the nodule measured 2.7 cm.  Other nodular findings were felt to be too small to evaluate by PET, there was a single hypermetabolic right paratracheal lymph node measuring 1.2 cm with an SUV of 9.8, no other evidence of metastatic disease could be appreciated.  A CT super D scan on 04/01/2023 again called this area in the right lower lobe measuring up to 2.8 cm.  No other suspicious  pulmonary nodules were identified.  A bronchoscopy on 04/07/2023 with Dr. Delton Coombes identified non-small cell carcinoma within the fine-needle aspirate of the right lower lobe specimen and also within the right lower lobe brushing specimen. A 4R node was sampled as well and showed adenocarcinoma.  She is seen today to discuss treatment recommendations for her cancer.  She is scheduled to meet later this week with Dr. Arbutus Ped tomorrow.    PREVIOUS RADIATION THERAPY: No   PAST MEDICAL HISTORY:  Past Medical History:  Diagnosis [name]   Allergy    year around   Anemia    long time ago    Anxiety    Arthritis    RA    Cataract    removed  both dr Orson Slick-    Complication of anesthesia    DEPENDENT EDEMA 07/21/2007   DIABETES MELLITUS, TYPE II 11/05/2006   FOOT PAIN 05/23/2009   GERD 11/20/2006   Hepatitis    HEPATITIS C 02/08/2007   sees GI @ [facility]   Hyperlipidemia    HYPERTENSION 11/05/2006   LOW BACK PAIN SYNDROME 07/21/2007   Neuromuscular disorder (HCC)    neuropathy legs, feet    PONV (postoperative nausea and vomiting)    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 Zenovia Jordan        PAST SURGICAL HISTORY: Past Surgical History:  Procedure Laterality [name]   BRONCHIAL BRUSHINGS  04/07/2023   Procedure: BRONCHIAL BRUSHINGS;  Surgeon: Leslye Peer, MD;  Location: [facility] ENDOSCOPY;  Service: Pulmonary;;   BRONCHIAL NEEDLE ASPIRATION BIOPSY  04/07/2023   Procedure: BRONCHIAL NEEDLE ASPIRATION BIOPSIES;  Surgeon: Leslye Peer, MD;  Location: [facility] ENDOSCOPY;  Service: Pulmonary;;   CATARACT EXTRACTION     COLONOSCOPY  11/23/2019   per Dr. Christella Hartigan, adenmatous polyp, repeat in 7 yrs   ESOPHAGOGASTRODUODENOSCOPY  06/20/2008   at [facility], clear    FINE NEEDLE ASPIRATION  04/07/2023   Procedure: FINE NEEDLE ASPIRATION (FNA) LINEAR;  Surgeon: Leslye Peer, MD;  Location: [facility] ENDOSCOPY;  Service: Pulmonary;;   LUMBAR  LAMINECTOMY     1995   ROTATOR CUFF REPAIR Right    SPINAL FUSION N/A 07/29/2022   Procedure: T10-PELVIS INSTRUMENTATION/ T10-L4 POSTERIOR SPINAL FUSION;  Surgeon: London Sheer, MD;  Location: [facility] OR;  Service: Orthopedics;  Laterality: N/A;   UPPER GASTROINTESTINAL ENDOSCOPY  2010   [facility]    VIDEO BRONCHOSCOPY WITH ENDOBRONCHIAL ULTRASOUND N/A 04/07/2023   Procedure: VIDEO BRONCHOSCOPY WITH ENDOBRONCHIAL ULTRASOUND;  Surgeon: Leslye Peer, MD;  Location: [facility] ENDOSCOPY;  Service: Pulmonary;  Laterality: N/A;     FAMILY HISTORY:  Family History  Problem Relation Age of Onset   Arthritis Other    Diabetes Other    Hypertension Other    Cancer Other        lung   Lung cancer Mother    Colon cancer Neg Hx    Colon polyps Neg Hx    Esophageal cancer Neg Hx    Rectal cancer Neg Hx    Stomach cancer Neg Hx      SOCIAL HISTORY:  reports that she has been smoking cigarettes and cigars. She started smoking about 46 years ago. She has a 1.4 pack-year smoking history. She has never used smokeless tobacco. She reports current alcohol use. She reports that she does not currently use drugs. The patient is widowed and lives in [place]. She is accompanied by her daughter Wendy [name] today who helps her.    ALLERGIES: Aspirin and Tramadol   MEDICATIONS:  Current Outpatient Medications  Medication Sig Dispense Refill   ACCU-CHEK GUIDE TEST test strip USE TO TEST ONCE DAILY 100 strip 1   Accu-Chek Softclix Lancets lancets Test once per day and diagnosis code is E 11.9 and dispense for (ACCU-CHEK Soft Touch) 100 each 12   ALPRAZolam (XANAX) 1 MG tablet TAKE 1 TABLET(1 MG) BY MOUTH AT BEDTIME AS NEEDED FOR ANXIETY OR SLEEP 90 tablet 1   amLODipine (NORVASC) 10 MG tablet Take 1 tablet (10 mg total) by mouth daily. 90 tablet 3   atorvastatin (LIPITOR) 10 MG tablet Take 1 tablet (10 mg total) by mouth daily. 90 tablet 3   Blood Glucose Monitoring Suppl (ACCU-CHEK GUIDE) w/Device KIT Use to test  sugars daily. 1 kit 0   Blood Pressure Monitor KIT Use to check blood pressure. 1 kit 0   Cholecalciferol (VITAMIN D3) 20 MCG (800 UNIT) TABS Take 800 Units by mouth daily.     DULoxetine (CYMBALTA) 30 MG capsule Take 1 capsule (30 mg total) by mouth at bedtime.     fluticasone (FLONASE) 50 MCG/ACT nasal spray SPRAY 2 SPRAYS INTO EACH NOSTRIL EVERY DAY 48 mL 3   furosemide (LASIX) 20 MG tablet Take 1 tablet (20 mg total) by mouth daily as needed (fluid retention). 90 tablet 3   Ginkgo Biloba (GNP GINGKO  BILOBA EXTRACT PO) Take by mouth. (Patient not taking: Reported on 04/15/2023)     glipiZIDE (GLUCOTROL) 10 MG tablet Take 1 tablet (10 mg total) by mouth 2 (two) times daily before a meal. 60 tablet 1   HYDROcodone-acetaminophen (NORCO) 10-325 MG tablet Take 1 tablet by mouth every 6 (six) hours as needed for moderate pain (pain score 4-6). 120 tablet 0   hydroxychloroquine (PLAQUENIL) 200 MG tablet TAKE 1 TABLET BY MOUTH EVERY DAY 90 tablet 1   Lancets (ACCU-CHEK SOFT TOUCH) lancets Test once per day and diagnosis code is E 11.9 and dispense for Aviva plus 100 each 3   levocetirizine (XYZAL) 5 MG tablet Take 1 tablet (5 mg total) by mouth every evening. 90 tablet 3   metFORMIN (GLUCOPHAGE) 500 MG tablet Take 1 tablet (500 mg total) by mouth 2 (two) times daily with a meal. 180 tablet 3   mupirocin ointment (BACTROBAN) 2 % Apply 1 Application topically 2 (two) times daily. 22 g 5   omeprazole (PRILOSEC) 40 MG capsule TAKE 1 CAPSULE BY MOUTH EVERY DAY 90 capsule 3   ondansetron (ZOFRAN) 4 MG tablet Take 1 tablet (4 mg total) by mouth every 8 (eight) hours as needed for nausea or vomiting. 90 tablet 3   polyethylene glycol (MIRALAX / GLYCOLAX) 17 g packet Take 17 g by mouth daily as needed for mild constipation.     potassium chloride (KLOR-CON) 10 MEQ tablet Take 1 tablet (10 mEq total) by mouth daily. 90 tablet 3   RESTASIS 0.05 % ophthalmic emulsion Place 1 drop into both eyes 2 (two) times daily.      sitaGLIPtin (JANUVIA) 100 MG tablet Take 1 tablet (100 mg total) by mouth at bedtime.     No current facility-administered medications for this encounter.     REVIEW OF SYSTEMS: On review of systems, the patient reports that she is doing well overall. She denies any unintended weight changes, shortness of breath or chest pain. She is not having cough or hemoptysis since her biopsy. She reports tiredness at baseline and joint pains from her history of RA but no new symptoms. No headaches, dizziness, or movement difficulties are verbalized. No other complaints are noted.       PHYSICAL EXAM:  Unable to assess due to encounter type   ECOG = 1  0 - Asymptomatic (Fully active, able to carry on all predisease activities without restriction)  1 - Symptomatic but completely ambulatory (Restricted in physically strenuous activity but ambulatory and able to carry out work of a light or sedentary nature. For example, light housework, office work)  2 - Symptomatic, <50% in bed during the day (Ambulatory and capable of all self care but unable to carry out any work activities. Up and about more than 50% of waking hours)  3 - Symptomatic, >50% in bed, but not bedbound (Capable of only limited self-care, confined to bed or chair 50% or more of waking hours)  4 - Bedbound (Completely disabled. Cannot carry on any self-care. Totally confined to bed or chair)  5 - Death   Wendy [name] MM, Creech RH, Tormey DC, et al. [phone]). "Toxicity and response criteria of the [facility]". Am. Evlyn Clines. Oncol. 5 (6): 649-55    LABORATORY DATA:  Lab Results  Component Value [name]   WBC 5.7 01/21/2023   HGB 16.3 (H) 04/07/2023   HCT 48.0 (H) 04/07/2023   MCV 77.8 (L) 01/21/2023   PLT 246.0 01/21/2023   Lab Results  Component Value [name]   NA 139 04/07/2023   K 3.8 04/07/2023   CL 104 04/07/2023   CO2 30 01/21/2023   Lab Results  Component Value [name]   ALT 11 01/21/2023   AST 15  01/21/2023   ALKPHOS 110 01/21/2023   BILITOT 1.3 (H) 01/21/2023      RADIOGRAPHY: XR Lumbar Spine Complete Result [name]: 04/16/2023 XRs of the lumbar spine taken 04/16/2023 were independently reviewed and interpreted, showing instrumentation from T10-pelvis. Screws appear in appropriate position. No lucency seen around the screws. No kyphosis seen above her fusion. No evidence of instability or translation seen on flexion/extension views. No fracture or dislocation seen. Interbody devices at L2/3 and L3/4 are in satisfactory position.   CT Super D Chest Wo Contrast Result [name]: 04/07/2023 CLINICAL DATA:  Pulmonary nodule.  Bronchoscopy planning. EXAM: CT CHEST WITHOUT CONTRAST TECHNIQUE: Multidetector CT imaging of the chest was performed using thin slice collimation for electromagnetic bronchoscopy planning purposes, without intravenous contrast. RADIATION DOSE REDUCTION: This exam was performed according to the departmental dose-optimization program which includes automated exposure control, adjustment of the mA and/or kV according to patient size and/or use of iterative reconstruction technique. COMPARISON:  Chest CT 01/10/2023.  PET-CT 02/05/2023. FINDINGS: Cardiovascular: Mild atherosclerosis of the aorta, great vessels and coronary arteries. The heart size is normal. There is no pericardial effusion. Mediastinum/Nodes: Low right paratracheal node measuring 1.5 cm short axis on image 21/2 has enlarged from the previous CT and was hypermetabolic on PET-CT, consistent with a nodal metastasis. No other enlarged mediastinal, hilar or axillary lymph nodes identified. The thyroid gland, trachea and esophagus demonstrate no significant findings. Lungs/Pleura: No pleural effusion or pneumothorax. The lobulated nodule within the superior segment of the right lower lobe has mildly enlarged from previous CT, measuring 2.7 x 2.3 cm on image 67/7 and 2.8 cm on sagittal image 70/6. This was hypermetabolic on PET-CT,  consistent with primary bronchogenic carcinoma. No other suspicious pulmonary nodules are identified. The previously demonstrated ill-defined ground-glass nodule in the right upper lobe is grossly stable, measuring approximately 6 mm on image 55/7. This demonstrates no solid components. Upper abdomen: No significant findings in the visualized upper abdomen. There are small calcified splenic granulomas. No adrenal mass. Musculoskeletal/Chest wall: There is no chest wall mass or suspicious osseous finding. Multilevel spondylosis status post thoracolumbar fusion. IMPRESSION: 1. Imaging for bronchoscopy planning and guidance. 2. Mild enlargement of the lobulated nodule in the superior segment of the right lower lobe, consistent with primary bronchogenic carcinoma. 3. Enlarging low right paratracheal lymph node, consistent with a nodal metastasis. 4. No other evidence of metastatic disease. 5. Stable small ground-glass nodule in the right upper lobe. Recommend attention on follow-up. 6.  Aortic Atherosclerosis (ICD10-I70.0). Electronically Signed   By: Carey Bullocks M.D.   On: 04/07/2023 15:18   DG Chest Port 1 View Result [name]: 04/07/2023 CLINICAL DATA:  Post bronchoscopy with biopsy. EXAM: PORTABLE CHEST 1 VIEW COMPARISON:  Radiographs 07/30/2022, CT 04/01/2023 and PET-CT 02/05/2023. FINDINGS: 1355 hours. The known retro hilar right lower lobe pulmonary nodule is not well visualized. The heart size and mediastinal contours are stable with aortic atherosclerosis. Mild atelectasis is present at both lung bases. No evidence of pneumothorax, significant pleural effusion or pulmonary contusion. Postsurgical changes from previous multilevel thoracolumbar fusion. IMPRESSION: No evidence of pneumothorax or other acute complication following bronchoscopy and biopsy. The known right lower lobe pulmonary nodule is not well visualized. Electronically Signed   By: Hilarie Fredrickson.D.  On: 04/07/2023 15:09   DG C-ARM  BRONCHOSCOPY Result [name]: 04/07/2023 C-ARM BRONCHOSCOPY: Fluoroscopy was utilized by the requesting physician.  No radiographic interpretation.       IMPRESSION/PLAN: 1. Stage IIIA, cT1cN2M0, NSCLC, adenocarcinoma of the RLL. Dr. Mitzi Hansen discusses the pathology findings and reviews the nature of locally advanced lung cancer. She would benefit from an MRI of the brain to rule out metastatic disease. Dr. Mitzi Hansen discusses the rationale for chemoradiation.  We discussed the risks, benefits, short, and long term effects of radiotherapy, as well as the curative intent, and the patient is interested in proceeding. Dr. Mitzi Hansen discusses the delivery and logistics of radiotherapy and anticipates a course of 6 1/2 weeks of radiotherapy to the RLL. She will come tomorrow for simulation and to see Dr. Arbutus Ped. We anticipate starting treatment on 04/27/23. 2. History on RA. The patient requests referral to a rheumatologist closer to home. A referral is placed to [facility] to establish care. She is aware she would need to avoid methotrexate during radiation but fortunately she's been stable for years on Plaquenil.     This encounter was conducted via telephone.  The patient has provided two factor identification and has given verbal consent for this type of encounter and has been advised to only accept a meeting of this type in a secure network environment. The time spent during this encounter was 60 minutes including preparation, discussion, and coordination of the patient's care. The attendants for this meeting include  Dr. Mitzi Hansen, Ronny Bacon  and [name] and her daughter Wendy [name]. During the encounter,  Rober Minion, RN, Dr. Mitzi Hansen, and Ronny Bacon were located at [facility] Radiation Oncology Department.  Wendy [name] was located at home with her daughter Wendy [name].    The above documentation reflects my direct findings during this shared patient visit.  Please see the separate note by Dr. Mitzi Hansen on this [name] for the remainder of the patient's plan of care.    Osker Mason, [facility]   **Disclaimer: This note was dictated with voice recognition software. Similar sounding words can inadvertently be transcribed and this note may contain transcription errors which may not have been corrected upon publication of note.**

## 2023-04-21 NOTE — Patient Instructions (Signed)
Continue Januvia, metformin, glipizide at the same doses.  Start Lantus 25 units daily in the morning.  Long-acting insulin.  Check blood sugar before meals and at bedtime.  Bring glucometer in the follow-up visit.

## 2023-04-21 NOTE — Telephone Encounter (Signed)
2/18 Follow up call to Dr. Daneil Dolin office for release of medical records.  Have no records for patient at this practice, should have records at former practice -per Inetta Fermo (Dr. Daneil Dolin office).  Sent stat fax request to [facility] and also to [employer].  Waiting on records.

## 2023-04-21 NOTE — Telephone Encounter (Signed)
per chat request for patient to come in on 2/19 instead of 2/20.Wendy [name] and spoke with patient daughter to reschedule appt.

## 2023-04-22 ENCOUNTER — Telehealth: Payer: Self-pay | Admitting: *Deleted

## 2023-04-22 ENCOUNTER — Other Ambulatory Visit: Payer: Self-pay | Admitting: Family Medicine

## 2023-04-22 ENCOUNTER — Inpatient Hospital Stay: Payer: Medicare HMO | Attending: Internal Medicine

## 2023-04-22 ENCOUNTER — Inpatient Hospital Stay: Payer: Medicare HMO | Admitting: Internal Medicine

## 2023-04-22 ENCOUNTER — Ambulatory Visit
Admission: RE | Admit: 2023-04-22 | Discharge: 2023-04-22 | Disposition: A | Discharge status: Home / Self Care | Payer: Medicare HMO | Source: Ambulatory Visit | Attending: Radiation Oncology | Admitting: Radiation Oncology

## 2023-04-22 VITALS — BP 148/93 | HR 86 | Temp 97.6°F | Resp 18 | Ht 63.0 in | Wt 175.9 lb

## 2023-04-22 DIAGNOSIS — Z833 Family history of diabetes mellitus: Secondary | ICD-10-CM | POA: Insufficient documentation

## 2023-04-22 DIAGNOSIS — Z886 Allergy status to analgesic agent status: Secondary | ICD-10-CM | POA: Insufficient documentation

## 2023-04-22 DIAGNOSIS — I700 Atherosclerosis of aorta: Secondary | ICD-10-CM | POA: Insufficient documentation

## 2023-04-22 DIAGNOSIS — M069 Rheumatoid arthritis, unspecified: Secondary | ICD-10-CM | POA: Diagnosis not present

## 2023-04-22 DIAGNOSIS — K219 Gastro-esophageal reflux disease without esophagitis: Secondary | ICD-10-CM | POA: Insufficient documentation

## 2023-04-22 DIAGNOSIS — E785 Hyperlipidemia, unspecified: Secondary | ICD-10-CM | POA: Insufficient documentation

## 2023-04-22 DIAGNOSIS — Z8249 Family history of ischemic heart disease and other diseases of the circulatory system: Secondary | ICD-10-CM

## 2023-04-22 DIAGNOSIS — Z79899 Other long term (current) drug therapy: Secondary | ICD-10-CM

## 2023-04-22 DIAGNOSIS — Z801 Family history of malignant neoplasm of trachea, bronchus and lung: Secondary | ICD-10-CM | POA: Insufficient documentation

## 2023-04-22 DIAGNOSIS — M2559 Pain in other specified joint: Secondary | ICD-10-CM | POA: Diagnosis not present

## 2023-04-22 DIAGNOSIS — F419 Anxiety disorder, unspecified: Secondary | ICD-10-CM | POA: Insufficient documentation

## 2023-04-22 DIAGNOSIS — R5383 Other fatigue: Secondary | ICD-10-CM

## 2023-04-22 DIAGNOSIS — C3431 Malignant neoplasm of lower lobe, right bronchus or lung: Secondary | ICD-10-CM | POA: Insufficient documentation

## 2023-04-22 DIAGNOSIS — I10 Essential (primary) hypertension: Secondary | ICD-10-CM | POA: Insufficient documentation

## 2023-04-22 DIAGNOSIS — Z87891 Personal history of nicotine dependence: Secondary | ICD-10-CM | POA: Diagnosis not present

## 2023-04-22 DIAGNOSIS — R059 Cough, unspecified: Secondary | ICD-10-CM | POA: Diagnosis not present

## 2023-04-22 DIAGNOSIS — R112 Nausea with vomiting, unspecified: Secondary | ICD-10-CM | POA: Diagnosis not present

## 2023-04-22 DIAGNOSIS — R0600 Dyspnea, unspecified: Secondary | ICD-10-CM | POA: Diagnosis not present

## 2023-04-22 DIAGNOSIS — E119 Type 2 diabetes mellitus without complications: Secondary | ICD-10-CM | POA: Insufficient documentation

## 2023-04-22 DIAGNOSIS — Z885 Allergy status to narcotic agent status: Secondary | ICD-10-CM | POA: Diagnosis not present

## 2023-04-22 DIAGNOSIS — Z8261 Family history of arthritis: Secondary | ICD-10-CM | POA: Diagnosis not present

## 2023-04-22 DIAGNOSIS — B1920 Unspecified viral hepatitis C without hepatic coma: Secondary | ICD-10-CM | POA: Diagnosis not present

## 2023-04-22 DIAGNOSIS — F17210 Nicotine dependence, cigarettes, uncomplicated: Secondary | ICD-10-CM | POA: Diagnosis not present

## 2023-04-22 LAB — CBC WITH DIFFERENTIAL (CANCER CENTER ONLY)
Abs Immature Granulocytes: 0.03 10*3/uL (ref 0.00–0.07)
Basophils Absolute: 0 10*3/uL (ref 0.0–0.1)
Basophils Relative: 1 %
Eosinophils Absolute: 0 10*3/uL (ref 0.0–0.5)
Eosinophils Relative: 0 %
HCT: 42.9 % (ref 36.0–46.0)
Hemoglobin: 13.4 g/dL (ref 12.0–15.0)
Immature Granulocytes: 1 %
Lymphocytes Relative: 27 %
Lymphs Abs: 1.7 10*3/uL (ref 0.7–4.0)
MCH: 24.7 pg — ABNORMAL LOW (ref 26.0–34.0)
MCHC: 31.2 g/dL (ref 30.0–36.0)
MCV: 79.2 fL — ABNORMAL LOW (ref 80.0–100.0)
Monocytes Absolute: 0.7 10*3/uL (ref 0.1–1.0)
Monocytes Relative: 11 %
Neutro Abs: 3.8 10*3/uL (ref 1.7–7.7)
Neutrophils Relative %: 60 %
Platelet Count: 228 10*3/uL (ref 150–400)
RBC: 5.42 MIL/uL — ABNORMAL HIGH (ref 3.87–5.11)
RDW: 17.2 % — ABNORMAL HIGH (ref 11.5–15.5)
WBC Count: 6.2 10*3/uL (ref 4.0–10.5)
nRBC: 0 % (ref 0.0–0.2)

## 2023-04-22 LAB — CMP (CANCER CENTER ONLY)
ALT: 11 U/L (ref 0–44)
AST: 12 U/L — ABNORMAL LOW (ref 15–41)
Albumin: 4 g/dL (ref 3.5–5.0)
Alkaline Phosphatase: 96 U/L (ref 38–126)
Anion gap: 8 (ref 5–15)
BUN: 10 mg/dL (ref 8–23)
CO2: 27 mmol/L (ref 22–32)
Calcium: 9.1 mg/dL (ref 8.9–10.3)
Chloride: 103 mmol/L (ref 98–111)
Creatinine: 0.78 mg/dL (ref 0.44–1.00)
GFR, Estimated: >60 mL/min (ref >60)
Glucose, Bld: 238 mg/dL — ABNORMAL HIGH (ref 70–99)
Potassium: 3.5 mmol/L (ref 3.5–5.1)
Sodium: 138 mmol/L (ref 135–145)
Total Bilirubin: 1.4 mg/dL — ABNORMAL HIGH (ref 0.0–1.2)
Total Protein: 7.8 g/dL (ref 6.5–8.1)

## 2023-04-22 MED ORDER — ONDANSETRON HCL 8 MG PO TABS: 8.0000 mg | ORAL_TABLET | Freq: Three times a day (TID) | ORAL | 1 refills | Status: DC | PRN

## 2023-04-22 MED ORDER — LIDOCAINE-PRILOCAINE 2.5-2.5 % EX CREA: TOPICAL_CREAM | CUTANEOUS | 3 refills | Status: DC

## 2023-04-22 MED ORDER — PROCHLORPERAZINE MALEATE 10 MG PO TABS
10.0000 mg | ORAL_TABLET | Freq: Four times a day (QID) | ORAL | 1 refills | Status: DC | PRN
Start: 2023-04-22 — End: 2023-07-15

## 2023-04-22 NOTE — Progress Notes (Signed)
START ON PATHWAY REGIMEN - Non-Small Cell Lung     A cycle is every 7 days, concurrent with RT:     Paclitaxel      Carboplatin   **Always confirm dose/schedule in your pharmacy ordering system**  Patient Characteristics: Preoperative or Nonsurgical Candidate (Clinical Staging), Stage IIB (N2a only) or Stage III - Nonsurgical Candidate, PS = 0,1 Therapeutic Status: Preoperative or Nonsurgical Candidate (Clinical Staging) AJCC T Category: cT1c AJCC N Category: cN2b AJCC M Category: cM0 AJCC 9 Stage Grouping: IIIA Check here if patient was staged using an edition other than AJCC Staging 9th Edition: false ECOG Performance Status: 1 Intent of Therapy: Curative Intent, Discussed with Patient

## 2023-04-22 NOTE — Telephone Encounter (Signed)
Called patient to inform of MRI for 04-24-23- arrival time- 3:30 pm @ [facility], no restrictions to scan, lvm for a return call

## 2023-04-22 NOTE — Progress Notes (Signed)
[name] [phone]) [phone]   [phone]  CONSULT NOTE  REFERRING PHYSICIAN: Dr. Levy Pupa  REASON FOR CONSULTATION:  78 years old African-American female recently diagnosed with lung cancer  HPI Wendy [name] is a 78 y.o. female came to the clinic today for initial evaluation of recently diagnosed lung cancer accompanied by her daughter Wendy [name].Discussed the use of AI scribe software for clinical note transcription with the patient, who gave verbal consent to proceed.  History of Present Illness   Wendy [name] is a 78 year old female with lung cancer who presents for initial oncology consultation. She is accompanied by her daughter, Wendy [name].  She was diagnosed with lung cancer following a CT scan on January 10, 2023, due to symptoms of loss of appetite and general malaise. The scan revealed a 2.4 x 2.0 cm nodule in the right lower lobe and a 0.7 cm nodule in the right upper lobe. A PET scan on February 05, 2023, showed hypermetabolic activity in the right lower lobe nodule and a right paratracheal lymph node, while the right upper lobe nodule did not show activity. A bronchoscopy and biopsy on April 07, 2023, confirmed non-small cell lung cancer adenocarcinoma in the right lower lobe nodule and the 4R lymph node. Molecular studies showed no mutation and negative PD-L1 expression.  She experiences nausea and vomiting, with a recent episode of nausea this morning attributed to taking medication on an empty stomach. No chest pain, shortness of breath, cough, frequent nausea, vomiting, or diarrhea. She notes a slight weight loss from 176 to 175.9 pounds, attributed to decreased appetite.  Her past medical history includes anxiety, rheumatoid arthritis managed with Plaquenil since 2010 or 2012, hepatitis C, hypertension, diabetes, acid reflux, and hyperlipidemia. She denies any history of heart attacks or strokes.  Family history is significant for lung  cancer in her mother, who was diagnosed in her early 71s, and her sister, who had lung cancer that metastasized to the liver and brain. Her father had heart disease, and her brother had cancer in the hip area. Another sister died of congestive heart failure.  Social history reveals she is a widow with two children, a son and a daughter. She worked as a Lawyer. She has a history of smoking for about 40 years, quitting most recently two to three weeks ago. She occasionally consumes alcohol and has used marijuana for anxiety. She is allergic to tramadol and aspirin, which cause vomiting.      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 Zenovia Jordan     Past Surgical History:  Procedure Laterality Date   BRONCHIAL BRUSHINGS  04/07/2023   Procedure: BRONCHIAL BRUSHINGS;  Surgeon: Leslye Peer, MD;  Location: [facility] ENDOSCOPY;  Service: Pulmonary;;   BRONCHIAL NEEDLE ASPIRATION BIOPSY  04/07/2023   Procedure: BRONCHIAL NEEDLE ASPIRATION BIOPSIES;  Surgeon: Leslye Peer, MD;  Location: [facility] ENDOSCOPY;  Service: Pulmonary;;   CATARACT EXTRACTION     COLONOSCOPY  11/23/2019   per Dr. Christella Hartigan, adenmatous polyp, repeat in 7 yrs   ESOPHAGOGASTRODUODENOSCOPY  06/20/2008   at [facility], clear    FINE NEEDLE ASPIRATION  04/07/2023   Procedure: FINE NEEDLE ASPIRATION (FNA) LINEAR;  Surgeon: Leslye Peer, MD;  Location: [facility] ENDOSCOPY;  Service: Pulmonary;;   LUMBAR LAMINECTOMY     1995   ROTATOR CUFF REPAIR Right    SPINAL FUSION N/A 07/29/2022   Procedure: T10-PELVIS INSTRUMENTATION/ T10-L4 POSTERIOR SPINAL FUSION;  Surgeon: London Sheer, MD;  Location: [facility] OR;  Service: Orthopedics;  Laterality: N/A;   UPPER GASTROINTESTINAL ENDOSCOPY  2010   [facility]    VIDEO BRONCHOSCOPY WITH ENDOBRONCHIAL ULTRASOUND N/A 04/07/2023   Procedure: VIDEO BRONCHOSCOPY WITH ENDOBRONCHIAL ULTRASOUND;  Surgeon: Leslye Peer, MD;  Location: [facility] ENDOSCOPY;  Service: Pulmonary;  Laterality: N/A;    Family History  Problem Relation Age of Onset   Arthritis Other    Diabetes Other    Hypertension Other    Cancer Other        lung   Lung cancer Mother    Colon cancer Neg Hx    Colon polyps Neg Hx    Esophageal cancer Neg Hx    Rectal cancer Neg Hx    Stomach cancer Neg Hx     Social History Social History   Tobacco Use   Smoking status: Every Day    Current packs/day: 0.00    Average packs/day: (1.4 ttl pk-yrs)    Types: Cigarettes, Cigars    Start date: 04/19/1977    Last attempt to quit: 04/19/2022    Years since quitting: 1.0   Smokeless tobacco: Never   Tobacco comments:    1 black and mild every 2-3 days- no cigs - doesnt smoke the whole thing (quit smoking cigarettes around 2019)  Vaping Use   Vaping status: Never Used  Substance Use Topics   Alcohol use: Yes    Comment: maybe one drink on special occasions   Drug use: Not Currently    Allergies  Allergen Reactions   Aspirin Nausea And Vomiting   Tramadol Nausea And Vomiting    Current Outpatient Medications  Medication Sig Dispense Refill   ACCU-CHEK GUIDE TEST test strip USE TO TEST ONCE DAILY 100 strip 1   Accu-Chek Softclix Lancets lancets Test once per day and diagnosis code is E 11.9 and dispense for (ACCU-CHEK Soft Touch) 100 each 12   ALPRAZolam (XANAX) 1 MG tablet TAKE 1 TABLET(1 MG) BY MOUTH AT BEDTIME AS NEEDED  FOR ANXIETY OR SLEEP 90 tablet 1   amLODipine (NORVASC) 10 MG tablet Take 1 tablet (10 mg total) by mouth daily. 90 tablet 3   atorvastatin (LIPITOR) 10 MG tablet TAKE 1 TABLET BY MOUTH EVERY DAY 90 tablet 3   Blood Glucose Monitoring Suppl (ACCU-CHEK GUIDE) w/Device KIT Use to test sugars daily. 1 kit 0   Blood Glucose Monitoring Suppl DEVI 1 each by Does not apply route in the morning, at noon, and at bedtime. May substitute to any manufacturer covered by patient's insurance. 1 each 0   Blood Pressure Monitor KIT Use to check blood pressure. 1 kit 0   Cholecalciferol (VITAMIN D3) 20  MCG (800 UNIT) TABS Take 800 Units by mouth daily.     DULoxetine (CYMBALTA) 30 MG capsule Take 1 capsule (30 mg total) by mouth at bedtime.     fluticasone (FLONASE) 50 MCG/ACT nasal spray SPRAY 2 SPRAYS INTO EACH NOSTRIL EVERY DAY 48 mL 3   furosemide (LASIX) 20 MG tablet Take 1 tablet (20 mg total) by mouth daily as needed (fluid retention). 90 tablet 3   Ginkgo Biloba (GNP GINGKO BILOBA EXTRACT PO) Take by mouth.     glipiZIDE (GLUCOTROL) 10 MG tablet Take 1 tablet (10 mg total) by mouth 2 (two) times daily before a meal. 60 tablet 1   Glucose Blood (BLOOD GLUCOSE TEST STRIPS) STRP 1 each by In Vitro route in the morning, at noon, and at bedtime. May substitute to any manufacturer covered by patient's insurance. 100 each 3   HYDROcodone-acetaminophen (NORCO) 10-325 MG tablet Take 1 tablet by mouth every 6 (six) hours as needed for moderate pain (pain score 4-6). 120 tablet 0   hydroxychloroquine (PLAQUENIL) 200 MG tablet TAKE 1 TABLET(200 MG) BY MOUTH DAILY 90 tablet 1   insulin glargine (LANTUS) 100 UNIT/ML Solostar Pen Inject 25 Units into the skin daily. 15 mL 4   Insulin Pen Needle 32G X 4 MM MISC Use 4x a day 300 each 3   Lancet Device MISC 1 each by Does not apply route in the morning, at noon, and at bedtime. May substitute to any manufacturer covered by patient's insurance. 1 each 0   Lancets (ACCU-CHEK  SOFT TOUCH) lancets Test once per day and diagnosis code is E 11.9 and dispense for Aviva plus 100 each 3   Lancets Misc. MISC 1 each by Does not apply route in the morning, at noon, and at bedtime. May substitute to any manufacturer covered by patient's insurance. 100 each 3   levocetirizine (XYZAL) 5 MG tablet Take 1 tablet (5 mg total) by mouth every evening. 90 tablet 3   metFORMIN (GLUCOPHAGE) 500 MG tablet Take 1 tablet (500 mg total) by mouth 2 (two) times daily with a meal. 180 tablet 3   mupirocin ointment (BACTROBAN) 2 % Apply 1 Application topically 2 (two) times daily. 22 g 5   omeprazole (PRILOSEC) 40 MG capsule TAKE 1 CAPSULE BY MOUTH EVERY DAY 90 capsule 3   ondansetron (ZOFRAN) 4 MG tablet Take 1 tablet (4 mg total) by mouth every 8 (eight) hours as needed for nausea or vomiting. 90 tablet 3   polyethylene glycol (MIRALAX / GLYCOLAX) 17 g packet Take 17 g by mouth daily as needed for mild constipation.     potassium chloride (KLOR-CON) 10 MEQ tablet Take 1 tablet (10 mEq total) by mouth daily. 90 tablet 3   pregabalin (LYRICA) 300 MG capsule TAKE 1 CAPSULE(300 MG) BY MOUTH AT BEDTIME 90 capsule 0   RESTASIS 0.05 % ophthalmic emulsion Place 1 drop into both eyes 2 (two) times daily.     sitaGLIPtin (JANUVIA) 100 MG tablet Take 1 tablet (100 mg total) by mouth at bedtime.     No current facility-administered medications for this visit.    Review of Systems  Constitutional: positive for fatigue Eyes: negative Ears, nose, mouth, throat, and face: negative Respiratory: positive for cough and dyspnea on exertion Cardiovascular: negative Gastrointestinal: negative Genitourinary:negative Integument/breast: negative Hematologic/lymphatic: negative Musculoskeletal:positive for arthralgias Neurological: negative Behavioral/Psych: negative Endocrine: negative Allergic/Immunologic: negative  Physical Exam  ZOX:WRUEA, healthy, no distress, well nourished, well developed, and  anxious SKIN: skin color, texture,  turgor are normal, no rashes or significant lesions HEAD: Normocephalic, No masses, lesions, tenderness or abnormalities EYES: normal, PERRLA, Conjunctiva are pink and non-injected EARS: External ears normal, Canals clear OROPHARYNX:no exudate, no erythema, and lips, buccal mucosa, and tongue normal  NECK: supple, no adenopathy, no JVD LYMPH:  no palpable lymphadenopathy, no hepatosplenomegaly BREAST:not examined LUNGS: clear to auscultation , and palpation HEART: regular rate & rhythm, no murmurs, and no gallops ABDOMEN:abdomen soft, non-tender, normal bowel sounds, and no masses or organomegaly BACK: Back symmetric, no curvature., No CVA tenderness EXTREMITIES:no joint deformities, effusion, or inflammation, no edema  NEURO: alert & oriented x 3 with fluent speech, no focal motor/sensory deficits  PERFORMANCE STATUS: ECOG 1  LABORATORY DATA: Lab Results  Component Value Date   WBC 6.2 04/22/2023   HGB 13.4 04/22/2023   HCT 42.9 04/22/2023   MCV 79.2 (L) 04/22/2023   PLT 228 04/22/2023      Chemistry      Component Value Date/Time   NA 138 04/22/2023 1008   K 3.5 04/22/2023 1008   CL 103 04/22/2023 1008   CO2 27 04/22/2023 1008   BUN 10 04/22/2023 1008   CREATININE 0.78 04/22/2023 1008   CREATININE 1.07 (H) 09/16/2019 1511      Component Value Date/Time   CALCIUM 9.1 04/22/2023 1008   ALKPHOS 96 04/22/2023 1008   AST 12 (L) 04/22/2023 1008   ALT 11 04/22/2023 1008   BILITOT 1.4 (H) 04/22/2023 1008       RADIOGRAPHIC STUDIES: XR Lumbar Spine Complete Result Date: 04/16/2023 XRs of the lumbar spine taken 04/16/2023 were independently reviewed and interpreted, showing instrumentation from T10-pelvis. Screws appear in appropriate position. No lucency seen around the screws. No kyphosis seen above her fusion. No evidence of instability or translation seen on flexion/extension views. No fracture or dislocation seen. Interbody devices at  L2/3 and L3/4 are in satisfactory position.   CT Super D Chest Wo Contrast Result Date: 04/07/2023 CLINICAL DATA:  Pulmonary nodule.  Bronchoscopy planning. EXAM: CT CHEST WITHOUT CONTRAST TECHNIQUE: Multidetector CT imaging of the chest was performed using thin slice collimation for electromagnetic bronchoscopy planning purposes, without intravenous contrast. RADIATION DOSE REDUCTION: This exam was performed according to the departmental dose-optimization program which includes automated exposure control, adjustment of the mA and/or kV according to patient size and/or use of iterative reconstruction technique. COMPARISON:  Chest CT 01/10/2023.  PET-CT 02/05/2023. FINDINGS: Cardiovascular: Mild atherosclerosis of the aorta, great vessels and coronary arteries. The heart size is normal. There is no pericardial effusion. Mediastinum/Nodes: Low right paratracheal node measuring 1.5 cm short axis on image 21/2 has enlarged from the previous CT and was hypermetabolic on PET-CT, consistent with a nodal metastasis. No other enlarged mediastinal, hilar or axillary lymph nodes identified. The thyroid gland, trachea and esophagus demonstrate no significant findings. Lungs/Pleura: No pleural effusion or pneumothorax. The lobulated nodule within the superior segment of the right lower lobe has mildly enlarged from previous CT, measuring 2.7 x 2.3 cm on image 67/7 and 2.8 cm on sagittal image 70/6. This was hypermetabolic on PET-CT, consistent with primary bronchogenic carcinoma. No other suspicious pulmonary nodules are identified. The previously demonstrated ill-defined ground-glass nodule in the right upper lobe is grossly stable, measuring approximately 6 mm on image 55/7. This demonstrates no solid components. Upper abdomen: No significant findings in the visualized upper abdomen. There are small calcified splenic granulomas. No adrenal mass. Musculoskeletal/Chest wall: There is no chest wall mass or suspicious osseous  finding. Multilevel spondylosis  status post thoracolumbar fusion. IMPRESSION: 1. Imaging for bronchoscopy planning and guidance. 2. Mild enlargement of the lobulated nodule in the superior segment of the right lower lobe, consistent with primary bronchogenic carcinoma. 3. Enlarging low right paratracheal lymph node, consistent with a nodal metastasis. 4. No other evidence of metastatic disease. 5. Stable small ground-glass nodule in the right upper lobe. Recommend attention on follow-up. 6.  Aortic Atherosclerosis (ICD10-I70.0). Electronically Signed   By: Carey Bullocks M.D.   On: 04/07/2023 15:18   DG Chest Port 1 View Result Date: 04/07/2023 CLINICAL DATA:  Post bronchoscopy with biopsy. EXAM: PORTABLE CHEST 1 VIEW COMPARISON:  Radiographs 07/30/2022, CT 04/01/2023 and PET-CT 02/05/2023. FINDINGS: 1355 hours. The known retro hilar right lower lobe pulmonary nodule is not well visualized. The heart size and mediastinal contours are stable with aortic atherosclerosis. Mild atelectasis is present at both lung bases. No evidence of pneumothorax, significant pleural effusion or pulmonary contusion. Postsurgical changes from previous multilevel thoracolumbar fusion. IMPRESSION: No evidence of pneumothorax or other acute complication following bronchoscopy and biopsy. The known right lower lobe pulmonary nodule is not well visualized. Electronically Signed   By: Carey Bullocks M.D.   On: 04/07/2023 15:09   DG C-ARM BRONCHOSCOPY Result Date: 04/07/2023 C-ARM BRONCHOSCOPY: Fluoroscopy was utilized by the requesting physician.  No radiographic interpretation.    ASSESSMENT: This is a very pleasant 78 years old African-American female recently diagnosed with a stage IIIa (T1c, N2, M0) non-small cell lung cancer, adenocarcinoma presented with right lower lobe lung nodule in addition to right paratracheal lymphadenopathy diagnosed in February 2024. Molecular studies by [id number] showed no actionable mutations and  negative PD-L1 expression.   PLAN: I had a lengthy discussion with the patient and her daughter today about her current disease stage, prognosis and treatment options.  I personally and independently reviewed the imaging studies as well as the pathology report.    Non-Small Cell Lung Cancer (NSCLC) - Adenocarcinoma Stage IIIA adenocarcinoma detected in 2023, confirmed by CT on January 10, 2023, showing a 2.4 x 2.0 cm nodule in the right lower lobe and a 0.7 cm nodule in the right upper lobe. PET scan on February 05, 2023, indicated hypermetabolic activity in the right lower lobe nodule and right paratracheal lymph node. Biopsy on April 07, 2023, confirmed adenocarcinoma with no mutations or PD-L1 expression. MRI of the brain scheduled for April 24, 2023, to rule out metastasis. Surgery not feasible due to lymph node involvement. Proposed treatment includes concurrent chemotherapy (carboplatin and paclitaxel) and radiation therapy. Weekly monitoring of blood counts, kidney, and liver function is essential. Post-treatment follow-up scan to assess response; immunotherapy considered based on rheumatoid arthritis status. - Administer weekly carboplatin and paclitaxel starting May 04, 2023 - Daily radiation therapy Monday through Friday for six and a half weeks - Weekly monitoring of blood counts, kidney, and liver function - Schedule follow-up scans three to four weeks post-treatment - Discuss potential immunotherapy post-chemo/radiation based on rheumatoid arthritis status - Order port placement for chemotherapy - Provide numbing cream for port site - Schedule chemo class for patient education - Coordinate with radiation oncologist for treatment plan - Provide 'Living with Lung Cancer' book  Rheumatoid Arthritis Chronic condition managed with Plaquenil since 2010. Potential impact on future immunotherapy for lung cancer. - Monitor rheumatoid arthritis symptoms during lung cancer treatment -  Consider impact on immunotherapy decision post-chemo/radiation  General Health Maintenance Anxiety, hepatitis C, hypertension, diabetes, acid reflux, and hyperlipidemia. Recently stopped smoking. - Encourage good nutrition  and maintaining weight - Advise staying active - Monitor for new symptoms or changes in existing conditions  Follow-up - MRI of the brain on April 24, 2023 - Start chemotherapy and radiation on May 04, 2023 - Bi-weekly follow-up visits during treatment - Coordinate with radiation oncologist for treatment schedule - Verify and confirm all appointment dates and times.   The patient was advised to call immediately if she has any other concerning symptoms in the interval. The patient voices understanding of current disease status and treatment options and is in agreement with the current care plan.  All questions were answered. The patient knows to call the clinic with any problems, questions or concerns. We can certainly see the patient much sooner if necessary.  Thank you so much for allowing me to participate in the care of Wendy [name]. I will continue to follow up the patient with you and assist in her care.  The total time spent in the appointment was 90 minutes.  Disclaimer: This note was dictated with voice recognition software. Similar sounding words can inadvertently be transcribed and may not be corrected upon review.   Lajuana Matte April 22, 2023, 12:02 PM

## 2023-04-23 ENCOUNTER — Telehealth: Payer: Self-pay | Admitting: Internal Medicine

## 2023-04-23 ENCOUNTER — Other Ambulatory Visit: Payer: Medicare HMO

## 2023-04-23 ENCOUNTER — Ambulatory Visit: Payer: Medicare HMO | Admitting: Internal Medicine

## 2023-04-23 ENCOUNTER — Ambulatory Visit: Payer: Medicare HMO | Admitting: Radiation Oncology

## 2023-04-23 ENCOUNTER — Ambulatory Visit: Payer: Medicare HMO

## 2023-04-23 DIAGNOSIS — B1920 Unspecified viral hepatitis C without hepatic coma: Secondary | ICD-10-CM | POA: Diagnosis not present

## 2023-04-23 DIAGNOSIS — I10 Essential (primary) hypertension: Secondary | ICD-10-CM | POA: Diagnosis not present

## 2023-04-23 DIAGNOSIS — R112 Nausea with vomiting, unspecified: Secondary | ICD-10-CM | POA: Diagnosis not present

## 2023-04-23 DIAGNOSIS — C3431 Malignant neoplasm of lower lobe, right bronchus or lung: Secondary | ICD-10-CM | POA: Diagnosis not present

## 2023-04-23 DIAGNOSIS — E785 Hyperlipidemia, unspecified: Secondary | ICD-10-CM | POA: Diagnosis not present

## 2023-04-23 DIAGNOSIS — K219 Gastro-esophageal reflux disease without esophagitis: Secondary | ICD-10-CM | POA: Diagnosis not present

## 2023-04-23 DIAGNOSIS — F17210 Nicotine dependence, cigarettes, uncomplicated: Secondary | ICD-10-CM | POA: Diagnosis not present

## 2023-04-23 DIAGNOSIS — E119 Type 2 diabetes mellitus without complications: Secondary | ICD-10-CM | POA: Diagnosis not present

## 2023-04-23 DIAGNOSIS — F419 Anxiety disorder, unspecified: Secondary | ICD-10-CM | POA: Diagnosis not present

## 2023-04-23 DIAGNOSIS — M069 Rheumatoid arthritis, unspecified: Secondary | ICD-10-CM | POA: Diagnosis not present

## 2023-04-23 NOTE — Telephone Encounter (Signed)
 [name]

## 2023-04-24 ENCOUNTER — Encounter: Payer: Self-pay | Admitting: Acute Care

## 2023-04-24 ENCOUNTER — Other Ambulatory Visit: Payer: Self-pay

## 2023-04-24 ENCOUNTER — Institutional Professional Consult (permissible substitution): Payer: Medicare HMO | Admitting: Thoracic Surgery (Cardiothoracic Vascular Surgery)

## 2023-04-24 ENCOUNTER — Telehealth: Payer: Self-pay | Admitting: Radiation Oncology

## 2023-04-24 ENCOUNTER — Ambulatory Visit (HOSPITAL_COMMUNITY)
Admission: RE | Admit: 2023-04-24 | Discharge: 2023-04-24 | Disposition: A | Discharge status: Home / Self Care | Payer: Medicare HMO | Source: Ambulatory Visit | Attending: Radiation Oncology | Admitting: Radiation Oncology

## 2023-04-24 VITALS — BP 157/85 | HR 84 | Resp 18 | Ht 63.0 in | Wt 176.0 lb

## 2023-04-24 DIAGNOSIS — C3431 Malignant neoplasm of lower lobe, right bronchus or lung: Secondary | ICD-10-CM | POA: Insufficient documentation

## 2023-04-24 DIAGNOSIS — I6782 Cerebral ischemia: Secondary | ICD-10-CM | POA: Diagnosis not present

## 2023-04-24 DIAGNOSIS — C3490 Malignant neoplasm of unspecified part of unspecified bronchus or lung: Secondary | ICD-10-CM | POA: Diagnosis not present

## 2023-04-24 DIAGNOSIS — Q048 Other specified congenital malformations of brain: Secondary | ICD-10-CM | POA: Diagnosis not present

## 2023-04-24 MED ORDER — GADOBUTROL 1 MMOL/ML IV SOLN
7.0000 mL | Freq: Once | INTRAVENOUS | Status: AC | PRN
  Administered 2023-04-24: 7 mL via INTRAVENOUS

## 2023-04-24 NOTE — Telephone Encounter (Signed)
2/21 Follow up call to [facility] Physician spoke to [place], no records for this patient with Dr. Lennette Bihari at this practice.  Left voicemail at [facility] with Misty Stanley (Medical Records).  Waiting on call back.

## 2023-04-24 NOTE — Progress Notes (Signed)
Per conversation with pt on 2/20, she requested that her daughter Angelique Blonder be contacted on matters regarding her appts. I called denise to notify her about the pt's port placement on 2/27 at [facility]. I explained that the pt needs to go to [facility] at 12:00 through Entrance A. She can't eat after midnight and can only have clear liquids until 8am. She must hold all of her diabetes medications and any medications that need to be taken with food the morning of the procedure. The pt has to have someone drive her to and from the procedure and a responsible adult has to observe her for 24hrs to ensure there are no adverse effects to the anesthesia she will be given during the procedure. Angelique Blonder verbalized understanding of the information provided.

## 2023-04-26 DIAGNOSIS — C3490 Malignant neoplasm of unspecified part of unspecified bronchus or lung: Secondary | ICD-10-CM | POA: Diagnosis not present

## 2023-04-27 ENCOUNTER — Ambulatory Visit: Payer: Medicare HMO | Admitting: Radiation Oncology

## 2023-04-27 ENCOUNTER — Telehealth: Payer: Self-pay | Admitting: Radiation Oncology

## 2023-04-27 NOTE — Progress Notes (Signed)
 Pharmacist Chemotherapy Monitoring - Initial Assessment    Anticipated start date: 05/04/23   The following has been reviewed per standard work regarding the patient's treatment regimen: The patient's diagnosis, treatment plan and drug doses, and organ/hematologic function Lab orders and baseline tests specific to treatment regimen  The treatment plan start date, drug sequencing, and pre-medications Prior authorization status  Patient's documented medication list, including drug-drug interaction screen and prescriptions for anti-emetics and supportive care specific to the treatment regimen The drug concentrations, fluid compatibility, administration routes, and timing of the medications to be used The patient's access for treatment and lifetime cumulative dose history, if applicable  The patient's medication allergies and previous infusion related reactions, if applicable   Changes made to treatment plan:  N/A  Follow up needed:  N/A   Janeice Robinson, RPH, 04/27/2023  1:17 PM

## 2023-04-27 NOTE — Telephone Encounter (Signed)
 I spoke with the patient to let her know her MRI results. Since her appts for chemo aren't until 05/04/23, we will cancel this week's therapy and start on Monday 05/04/23.

## 2023-04-28 ENCOUNTER — Ambulatory Visit: Payer: Medicare HMO

## 2023-04-29 ENCOUNTER — Ambulatory Visit: Payer: Medicare HMO

## 2023-04-29 ENCOUNTER — Other Ambulatory Visit: Payer: Self-pay | Admitting: Radiology

## 2023-04-30 ENCOUNTER — Encounter (HOSPITAL_COMMUNITY): Payer: Self-pay

## 2023-04-30 ENCOUNTER — Other Ambulatory Visit: Payer: Self-pay

## 2023-04-30 ENCOUNTER — Ambulatory Visit: Payer: Medicare HMO

## 2023-04-30 ENCOUNTER — Ambulatory Visit (HOSPITAL_COMMUNITY)
Admission: RE | Admit: 2023-04-30 | Discharge: 2023-04-30 | Disposition: A | Discharge status: Home / Self Care | Payer: Medicare HMO | Source: Ambulatory Visit | Attending: Internal Medicine | Admitting: Internal Medicine

## 2023-04-30 ENCOUNTER — Telehealth: Payer: Self-pay | Admitting: Radiation Oncology

## 2023-04-30 DIAGNOSIS — F419 Anxiety disorder, unspecified: Secondary | ICD-10-CM | POA: Insufficient documentation

## 2023-04-30 DIAGNOSIS — K219 Gastro-esophageal reflux disease without esophagitis: Secondary | ICD-10-CM | POA: Insufficient documentation

## 2023-04-30 DIAGNOSIS — M069 Rheumatoid arthritis, unspecified: Secondary | ICD-10-CM | POA: Insufficient documentation

## 2023-04-30 DIAGNOSIS — C3431 Malignant neoplasm of lower lobe, right bronchus or lung: Secondary | ICD-10-CM | POA: Diagnosis not present

## 2023-04-30 DIAGNOSIS — Z7984 Long term (current) use of oral hypoglycemic drugs: Secondary | ICD-10-CM | POA: Diagnosis not present

## 2023-04-30 DIAGNOSIS — Z794 Long term (current) use of insulin: Secondary | ICD-10-CM | POA: Diagnosis not present

## 2023-04-30 DIAGNOSIS — E119 Type 2 diabetes mellitus without complications: Secondary | ICD-10-CM | POA: Insufficient documentation

## 2023-04-30 DIAGNOSIS — I10 Essential (primary) hypertension: Secondary | ICD-10-CM | POA: Insufficient documentation

## 2023-04-30 DIAGNOSIS — E785 Hyperlipidemia, unspecified: Secondary | ICD-10-CM | POA: Insufficient documentation

## 2023-04-30 DIAGNOSIS — Z452 Encounter for adjustment and management of vascular access device: Secondary | ICD-10-CM | POA: Diagnosis not present

## 2023-04-30 DIAGNOSIS — Z79899 Other long term (current) drug therapy: Secondary | ICD-10-CM | POA: Diagnosis not present

## 2023-04-30 DIAGNOSIS — F17290 Nicotine dependence, other tobacco product, uncomplicated: Secondary | ICD-10-CM | POA: Diagnosis not present

## 2023-04-30 HISTORY — PX: IR IMAGING GUIDED PORT INSERTION: IMG5740

## 2023-04-30 LAB — GLUCOSE, CAPILLARY
Glucose-Capillary: 75 mg/dL (ref 70–99)
Glucose-Capillary: 111 mg/dL — ABNORMAL HIGH (ref 70–99)

## 2023-04-30 MED ORDER — LIDOCAINE-EPINEPHRINE 1 %-1:100000 IJ SOLN
INTRAMUSCULAR | Status: AC
  Filled 2023-04-30: qty 1

## 2023-04-30 MED ORDER — SODIUM CHLORIDE 0.9 % IV SOLN: INTRAVENOUS | Status: DC

## 2023-04-30 MED ORDER — MIDAZOLAM HCL 2 MG/2ML IJ SOLN
INTRAMUSCULAR | Status: AC
  Filled 2023-04-30: qty 2

## 2023-04-30 MED ORDER — FENTANYL CITRATE (PF) 100 MCG/2ML IJ SOLN
INTRAMUSCULAR | Status: AC | PRN
  Administered 2023-04-30 (×3): 25 ug via INTRAVENOUS
  Administered 2023-04-30: 50 ug via INTRAVENOUS
  Administered 2023-04-30: 25 ug via INTRAVENOUS

## 2023-04-30 MED ORDER — HEPARIN SOD (PORK) LOCK FLUSH 100 UNIT/ML IV SOLN
INTRAVENOUS | Status: AC
  Filled 2023-04-30: qty 5

## 2023-04-30 MED ORDER — FENTANYL CITRATE (PF) 100 MCG/2ML IJ SOLN
INTRAMUSCULAR | Status: AC
Start: 2023-04-30 — End: ?
  Filled 2023-04-30: qty 2

## 2023-04-30 MED ORDER — FENTANYL CITRATE (PF) 100 MCG/2ML IJ SOLN
INTRAMUSCULAR | Status: AC
  Filled 2023-04-30: qty 2

## 2023-04-30 MED ORDER — LIDOCAINE-EPINEPHRINE 1 %-1:100000 IJ SOLN
20.0000 mL | Freq: Once | INTRAMUSCULAR | Status: AC
  Administered 2023-04-30: 20 mL

## 2023-04-30 MED ORDER — HEPARIN SOD (PORK) LOCK FLUSH 100 UNIT/ML IV SOLN
500.0000 [IU] | Freq: Once | INTRAVENOUS | Status: AC
  Administered 2023-04-30: 500 [IU] via INTRAVENOUS

## 2023-04-30 MED ORDER — IOHEXOL 300 MG/ML  SOLN
50.0000 mL | Freq: Once | INTRAMUSCULAR | Status: AC | PRN
  Administered 2023-04-30: 5 mL via INTRAVENOUS

## 2023-04-30 MED ORDER — MIDAZOLAM HCL 2 MG/2ML IJ SOLN
INTRAMUSCULAR | Status: AC | PRN
  Administered 2023-04-30 (×2): .5 mg via INTRAVENOUS
  Administered 2023-04-30 (×2): 1 mg via INTRAVENOUS

## 2023-04-30 NOTE — Progress Notes (Signed)
 Patient was given discharge instructions. She verbalized understanding.

## 2023-04-30 NOTE — H&P (Signed)
 Chief Complaint: Patient was seen in consultation today for lung cancer  Referring Physician(s): Mohamed,Mohamed  Supervising Physician: Roanna Banning  Patient Status: [facility] - Out-pt  History of Present Illness: Wendy [name] is a 78 y.o. female anxiety, DM, GERD, HTN, RA, HLD with recent diagnosis of right lower lobe lung cancer in need of durable venous access for initiation of chemotherapy.  She presents to [facility] Radiology for Port-A-Cath placement. She is accompanied by her daughter today who is available for transportation and additional family members at home.   She is understanding of the goals of the procedure and is agreeable to left sided Port-A-Cath placement.   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 Zenovia Jordan     Past Surgical History:  Procedure Laterality Date   BRONCHIAL BRUSHINGS  04/07/2023   Procedure: BRONCHIAL BRUSHINGS;  Surgeon: Leslye Peer, MD;  Location: [facility] ENDOSCOPY;  Service: Pulmonary;;   BRONCHIAL NEEDLE ASPIRATION BIOPSY  04/07/2023   Procedure: BRONCHIAL NEEDLE ASPIRATION BIOPSIES;  Surgeon: Leslye Peer, MD;  Location: [facility] ENDOSCOPY;  Service: Pulmonary;;   CATARACT EXTRACTION     COLONOSCOPY  11/23/2019   per Dr. Christella Hartigan, adenmatous polyp, repeat in 7 yrs   ESOPHAGOGASTRODUODENOSCOPY  06/20/2008   at [facility], clear    FINE  NEEDLE ASPIRATION  04/07/2023   Procedure: FINE NEEDLE ASPIRATION (FNA) LINEAR;  Surgeon: Leslye Peer, MD;  Location: [facility] ENDOSCOPY;  Service: Pulmonary;;   LUMBAR LAMINECTOMY     1995   ROTATOR CUFF REPAIR Right    SPINAL FUSION N/A 07/29/2022   Procedure: T10-PELVIS INSTRUMENTATION/ T10-L4 POSTERIOR SPINAL FUSION;  Surgeon: London Sheer, MD;  Location: [facility] OR;  Service: Orthopedics;  Laterality: N/A;   UPPER GASTROINTESTINAL ENDOSCOPY  2010   [facility]    VIDEO BRONCHOSCOPY WITH ENDOBRONCHIAL ULTRASOUND N/A 04/07/2023   Procedure: VIDEO BRONCHOSCOPY WITH ENDOBRONCHIAL ULTRASOUND;  Surgeon: Leslye Peer, MD;  Location: [facility] ENDOSCOPY;  Service: Pulmonary;  Laterality: N/A;    Allergies: Aspirin and Tramadol  Medications: Prior to Admission medications   Medication Sig Start Date End Date Taking? Authorizing Provider  ALPRAZolam (XANAX) 1 MG tablet TAKE 1 TABLET(1 MG) BY MOUTH AT BEDTIME AS NEEDED FOR ANXIETY OR SLEEP 03/02/23  Yes [name], MD  amLODipine (NORVASC) 10 MG tablet Take 1 tablet (10 mg total) by mouth daily. 03/24/23  Yes [name], MD  atorvastatin (LIPITOR) 10 MG tablet TAKE 1 TABLET BY MOUTH EVERY DAY 04/21/23  Yes [name], MD  Cholecalciferol (VITAMIN D3) 20 MCG (800 UNIT) TABS Take 800 Units by mouth daily.   Yes [provider]  DULoxetine (CYMBALTA) 30 MG capsule Take 1 capsule (30 mg  total) by mouth at bedtime. 04/07/23  Yes Leslye Peer, MD  fluticasone [facility]) 50 MCG/ACT nasal spray SPRAY 2 SPRAYS INTO EACH NOSTRIL EVERY DAY 03/09/23  Yes [name], MD  furosemide (LASIX) 20 MG tablet Take 1 tablet (20 mg total) by mouth daily as needed (fluid retention). 01/21/23  Yes [name], MD  Ginkgo Biloba (GNP GINGKO BILOBA EXTRACT PO) Take by mouth.   Yes [provider]  glipiZIDE (GLUCOTROL) 10 MG tablet Take 1 tablet (10 mg total) by mouth 2 (two) times daily before a meal. 03/19/23  Yes [name], MD   HYDROcodone-acetaminophen (NORCO) 10-325 MG tablet Take 1 tablet by mouth every 6 (six) hours as needed for moderate pain (pain score 4-6). 03/24/23  Yes [name], MD  hydroxychloroquine (PLAQUENIL) 200 MG tablet TAKE 1 TABLET(200 MG) BY MOUTH DAILY 04/21/23  Yes [name], MD  insulin glargine (LANTUS) 100 UNIT/ML Solostar Pen Inject 25 Units into the skin daily. 04/21/23  Yes Thapa, [place], MD  levocetirizine (XYZAL) 5 MG tablet Take 1 tablet (5 mg total) by mouth every evening. 03/09/23  Yes [name], MD  metFORMIN (GLUCOPHAGE) 500 MG tablet Take 1 tablet (500 mg total) by mouth 2 (two) times daily with a meal. 01/21/23  Yes [name], MD  omeprazole (PRILOSEC) 40 MG capsule TAKE 1 CAPSULE BY MOUTH EVERY DAY 03/09/23  Yes [name], MD  ondansetron (ZOFRAN) 4 MG tablet Take 1 tablet (4 mg total) by mouth every 8 (eight) hours as needed for nausea or vomiting. 03/24/23  Yes [name], MD  potassium chloride (KLOR-CON) 10 MEQ tablet Take 1 tablet (10 mEq total) by mouth daily. 06/25/22  Yes [name], MD  pregabalin (LYRICA) 300 MG capsule TAKE 1 CAPSULE(300 MG) BY MOUTH AT BEDTIME 04/21/23  Yes [name], MD  prochlorperazine (COMPAZINE) 10 MG tablet Take 1 tablet (10 mg total) by mouth every 6 (six) hours as needed for nausea or vomiting. 04/22/23  Yes Si Gaul, MD  sitaGLIPtin (JANUVIA) 100 MG tablet Take 1 tablet (100 mg total) by mouth at bedtime. 04/07/23  Yes Leslye Peer, MD  ACCU-CHEK GUIDE TEST test strip USE TO TEST ONCE DAILY 03/30/23   [name], MD  Accu-Chek Softclix Lancets lancets Test once per day and diagnosis code is E 11.9 and dispense for (ACCU-CHEK Soft Touch) 06/25/22   [name], MD  Blood Glucose Monitoring Suppl (ACCU-CHEK GUIDE) w/Device KIT Use to test sugars daily. 02/07/21   [name], MD  Blood Glucose Monitoring Suppl DEVI 1 each by Does not apply route in the morning, at noon, and at bedtime. May substitute to any  manufacturer covered by patient's insurance. 04/21/23   Thapa, [place], MD  Blood Pressure Monitor KIT Use to check blood pressure. 02/17/20   [name], MD  Glucose Blood (BLOOD GLUCOSE TEST STRIPS) STRP 1 each by In Vitro route in the morning, at noon, and at bedtime. May substitute to any manufacturer covered by patient's insurance. 04/21/23 09/01/23  Thapa, [place], MD  Insulin Pen Needle 32G X 4 MM MISC Use 4x a day 04/21/23   Thapa, [place], MD  Lancet Device MISC 1 each by Does not apply route in the morning, at noon, and at bedtime. May substitute to any manufacturer covered by patient's insurance. 04/21/23 05/21/23  Thapa, [place], MD  Lancets (ACCU-CHEK SOFT TOUCH) lancets Test once per day and diagnosis code  is E 11.9 and dispense for Aviva plus 02/17/20   [name], MD  Lancets Misc. MISC 1 each by Does not apply route in the morning, at noon, and at bedtime. May substitute to any manufacturer covered by patient's insurance. 04/21/23 05/21/23  Thapa, [place], MD  lidocaine-prilocaine (EMLA) cream Apply to affected area once 04/22/23   Si Gaul, MD  mupirocin ointment (BACTROBAN) 2 % Apply 1 Application topically 2 (two) times daily. 01/09/23   [name], MD  ondansetron (ZOFRAN) 8 MG tablet Take 1 tablet (8 mg total) by mouth every 8 (eight) hours as needed for nausea or vomiting. Start on the third day after chemotherapy. 04/22/23   Si Gaul, MD  polyethylene glycol (MIRALAX / GLYCOLAX) 17 g packet Take 17 g by mouth daily as needed for mild constipation.    [provider]  RESTASIS 0.05 % ophthalmic emulsion Place 1 drop into both eyes 2 (two) times daily. 02/01/22   [provider]     Family History  Problem Relation Age of Onset   Arthritis Other    Diabetes Other    Hypertension Other    Cancer Other        lung   Lung cancer Mother    Colon cancer Neg Hx    Colon polyps Neg Hx    Esophageal cancer Neg Hx    Rectal cancer Neg Hx    Stomach cancer  Neg Hx     Social History   Socioeconomic History   Marital status: Widowed    Spouse name: Not on file   Number of children: 2   Years of education: Not on file   Highest education level: Not on file  Occupational History   Not on file  Tobacco Use   Smoking status: Every Day    Current packs/day: 0.00    Average packs/day: (1.4 ttl pk-yrs)    Types: Cigarettes, Cigars    Start date: 04/19/1977    Last attempt to quit: 04/19/2022    Years since quitting: 1.0   Smokeless tobacco: Never   Tobacco comments:    1 black and mild every 2-3 days- no cigs - doesnt smoke the whole thing (quit smoking cigarettes around 2019)  Vaping Use   Vaping status: Never Used  Substance and Sexual Activity   Alcohol use: Yes    Comment: maybe one drink on special occasions   Drug use: Not Currently   Sexual activity: Not on file  Other Topics Concern   Not on file  Social History Narrative   ** Merged History Encounter **       Social Drivers of Health   Financial Resource Strain: Low Risk  (01/16/2022)   Overall Financial Resource Strain (CARDIA)    Difficulty of Paying Living Expenses: Not very hard  Food Insecurity: No Food Insecurity (04/21/2023)   Hunger Vital Sign    Worried About Running Out of Food in the Last Year: Never true    Ran Out of Food in the Last Year: Never true  Transportation Needs: No Transportation Needs (04/21/2023)   PRAPARE - Administrator, Civil Service (Medical): No    Lack of Transportation (Non-Medical): No  Physical Activity: Insufficiently Active (11/29/2021)   Exercise Vital Sign    Days of Exercise per Week: 7 days    Minutes of Exercise per Session: 20 min  Stress: No Stress Concern Present (11/29/2021)   [employer] of Occupational Health - Occupational Stress  Questionnaire    Feeling of Stress : Not at all  Social Connections: Unknown (01/17/2022)   Received from [facility]   Social Network    Social Network:  Not on file     Review of Systems: A 12 point ROS discussed and pertinent positives are indicated in the HPI above.  All other systems are negative.  Review of Systems  Constitutional:  Negative for fatigue and fever.  Respiratory:  Negative for cough and shortness of breath.   Cardiovascular:  Negative for chest pain.  Gastrointestinal:  Negative for abdominal pain, nausea and vomiting.  Musculoskeletal:  Negative for back pain.  Psychiatric/Behavioral:  Negative for behavioral problems and confusion.     Vital Signs: BP (!) 180/78   Pulse 73   Temp 98.3 F (36.8 C) (Oral)   Resp 17   Ht 5' 3.25" (1.607 m)   Wt 182 lb (82.6 kg)   SpO2 94%   BMI 31.99 kg/m   Physical Exam Vitals and nursing note reviewed.  Constitutional:      General: She is not in acute distress.    Appearance: Normal appearance. She is not ill-appearing.  HENT:     Mouth/Throat:     Mouth: Mucous membranes are moist.     Pharynx: Oropharynx is clear.  Cardiovascular:     Rate and Rhythm: Normal rate and regular rhythm.  Pulmonary:     Effort: Pulmonary effort is normal. No respiratory distress.     Breath sounds: Normal breath sounds.  Skin:    General: Skin is warm and dry.  Neurological:     General: No focal deficit present.     Mental Status: She is alert and oriented to person, place, and time. Mental status is at baseline.  Psychiatric:        Mood and Affect: Mood normal.        Behavior: Behavior normal.        Thought Content: Thought content normal.        Judgment: Judgment normal.      MD Evaluation Airway: WNL Heart: WNL Abdomen: WNL Chest/ Lungs: WNL ASA  Classification: 3 Mallampati/Airway Score: Two   Imaging: MR Brain W Wo Contrast Result Date: 04/24/2023 CLINICAL DATA:  Provided history: Malignant neoplasm of bronchus of right lower lobe. Non-small cell lung cancer, staging. EXAM: MRI HEAD WITHOUT AND WITH CONTRAST TECHNIQUE: Multiplanar, multiecho pulse sequences  of the brain and surrounding structures were obtained without and with intravenous contrast. CONTRAST:  7mL GADAVIST GADOBUTROL 1 MMOL/ML IV SOLN COMPARISON:  Non-contrast head CT 01/10/2023. FINDINGS: Brain: No age-advanced or lobar predominant cerebral atrophy. Multifocal T2 FLAIR hyperintense signal abnormality within the cerebral white matter, nonspecific but compatible with mild-to-moderate chronic small vessel ischemic disease. Nonspecific chronic microhemorrhage within the left occipital lobe (series 10, image 26). There is no acute infarct. No evidence of an intracranial mass. No extra-axial fluid collection. No midline shift. No pathologic intracranial enhancement identified. Vascular: Maintained flow voids within the proximal large arterial vessels. Small developmental venous anomaly within the left parietal lobe (anatomic variant). Skull and upper cervical spine: No focal worrisome round lesion. Sinuses/Orbits: No mass or acute finding within the imaged orbits. Prior bilateral ocular lens replacement. No significant paranasal sinus disease. IMPRESSION: 1. No evidence of intracranial metastatic disease. 2. Mild-to-moderate chronic small vessel ischemic changes within the cerebral white matter. Electronically Signed   By: Jackey Loge D.O.   On: 04/24/2023 18:11   XR Lumbar Spine Complete  Result Date: 04/16/2023 XRs of the lumbar spine taken 04/16/2023 were independently reviewed and interpreted, showing instrumentation from T10-pelvis. Screws appear in appropriate position. No lucency seen around the screws. No kyphosis seen above her fusion. No evidence of instability or translation seen on flexion/extension views. No fracture or dislocation seen. Interbody devices at L2/3 and L3/4 are in satisfactory position.   CT Super D Chest Wo Contrast Result Date: 04/07/2023 CLINICAL DATA:  Pulmonary nodule.  Bronchoscopy planning. EXAM: CT CHEST WITHOUT CONTRAST TECHNIQUE: Multidetector CT imaging of the chest  was performed using thin slice collimation for electromagnetic bronchoscopy planning purposes, without intravenous contrast. RADIATION DOSE REDUCTION: This exam was performed according to the departmental dose-optimization program which includes automated exposure control, adjustment of the mA and/or kV according to patient size and/or use of iterative reconstruction technique. COMPARISON:  Chest CT 01/10/2023.  PET-CT 02/05/2023. FINDINGS: Cardiovascular: Mild atherosclerosis of the aorta, great vessels and coronary arteries. The heart size is normal. There is no pericardial effusion. Mediastinum/Nodes: Low right paratracheal node measuring 1.5 cm short axis on image 21/2 has enlarged from the previous CT and was hypermetabolic on PET-CT, consistent with a nodal metastasis. No other enlarged mediastinal, hilar or axillary lymph nodes identified. The thyroid gland, trachea and esophagus demonstrate no significant findings. Lungs/Pleura: No pleural effusion or pneumothorax. The lobulated nodule within the superior segment of the right lower lobe has mildly enlarged from previous CT, measuring 2.7 x 2.3 cm on image 67/7 and 2.8 cm on sagittal image 70/6. This was hypermetabolic on PET-CT, consistent with primary bronchogenic carcinoma. No other suspicious pulmonary nodules are identified. The previously demonstrated ill-defined ground-glass nodule in the right upper lobe is grossly stable, measuring approximately 6 mm on image 55/7. This demonstrates no solid components. Upper abdomen: No significant findings in the visualized upper abdomen. There are small calcified splenic granulomas. No adrenal mass. Musculoskeletal/Chest wall: There is no chest wall mass or suspicious osseous finding. Multilevel spondylosis status post thoracolumbar fusion. IMPRESSION: 1. Imaging for bronchoscopy planning and guidance. 2. Mild enlargement of the lobulated nodule in the superior segment of the right lower lobe, consistent with  primary bronchogenic carcinoma. 3. Enlarging low right paratracheal lymph node, consistent with a nodal metastasis. 4. No other evidence of metastatic disease. 5. Stable small ground-glass nodule in the right upper lobe. Recommend attention on follow-up. 6.  Aortic Atherosclerosis (ICD10-I70.0). Electronically Signed   By: Carey Bullocks M.D.   On: 04/07/2023 15:18   DG Chest Port 1 View Result Date: 04/07/2023 CLINICAL DATA:  Post bronchoscopy with biopsy. EXAM: PORTABLE CHEST 1 VIEW COMPARISON:  Radiographs 07/30/2022, CT 04/01/2023 and PET-CT 02/05/2023. FINDINGS: 1355 hours. The known retro hilar right lower lobe pulmonary nodule is not well visualized. The heart size and mediastinal contours are stable with aortic atherosclerosis. Mild atelectasis is present at both lung bases. No evidence of pneumothorax, significant pleural effusion or pulmonary contusion. Postsurgical changes from previous multilevel thoracolumbar fusion. IMPRESSION: No evidence of pneumothorax or other acute complication following bronchoscopy and biopsy. The known right lower lobe pulmonary nodule is not well visualized. Electronically Signed   By: Carey Bullocks M.D.   On: 04/07/2023 15:09   DG C-ARM BRONCHOSCOPY Result Date: 04/07/2023 C-ARM BRONCHOSCOPY: Fluoroscopy was utilized by the requesting physician.  No radiographic interpretation.    Labs:  CBC: Recent Labs    08/03/22 0021 01/10/23 1004 01/21/23 1207 04/07/23 1119 04/22/23 1008  WBC 9.3 5.7 5.7  --  6.2  HGB 7.9* 13.0 13.3 16.3* 13.4  HCT 25.0* 42.0 41.7 48.0* 42.9  PLT 177 197 246.0  --  228    COAGS: No results for input(s): "INR", "APTT" in the last 8760 hours.  BMP: Recent Labs    08/02/22 0027 08/03/22 0021 01/10/23 1004 01/21/23 1207 04/07/23 1119 04/22/23 1008  NA 135 137 135 137 139 138  K 3.8 3.8 3.4* 4.1 3.8 3.5  CL 106 108 102 99 104 103  CO2 22 24 25 30   --  27  GLUCOSE 131* 145* 292* 303* 298* 238*  BUN 25* 16 11 17 17 10    CALCIUM 7.5* 7.7* 9.0 9.7  --  9.1  CREATININE 1.07* 0.88 0.86 0.83 0.90 0.78  GFRNONAA 54* >60 >60  --   --  >60    LIVER FUNCTION TESTS: Recent Labs    07/18/22 1501 01/10/23 1004 01/21/23 1207 04/22/23 1008  BILITOT 0.8 1.5* 1.3* 1.4*  AST 20 19 15  12*  ALT 16 18 11 11   ALKPHOS 55 94 110 96  PROT 7.4 8.4* 8.2 7.8  ALBUMIN 3.5 3.7 4.1 4.0    TUMOR MARKERS: No results for input(s): "AFPTM", "CEA", "CA199", "CHROMGRNA" in the last 8760 hours.  Assessment and Plan: Patient with past medical history of DM, HTN presents with complaint of recently diagnosed lung cancer.  IR consulted for Port-A-Cath placement at the request of Dr. Arbutus Ped. Case reviewed by Dr. Milford Cage who approves patient for procedure.  Patient presents today in their usual state of health.  She has been NPO and is not currently on blood thinners.   Risks and benefits of image guided port-a-catheter placement was discussed with the patient including, but not limited to bleeding, infection, pneumothorax, or fibrin sheath development and need for additional procedures.  All of the patient's questions were answered, patient is agreeable to proceed. Consent signed and in chart.   Thank you for this interesting consult.  I greatly enjoyed meeting Wendy [name] and look forward to participating in their care.  A copy of this report was sent to the requesting provider on this date.  Electronically Signed: Hoyt Koch, PA 04/30/2023, 2:04 PM   I spent a total of  30 Minutes   in face to face in clinical consultation, greater than 50% of which was counseling/coordinating care for lung cancer.

## 2023-04-30 NOTE — Telephone Encounter (Signed)
 2/27 Follow up call to [facility], Misty Stanley still out of office, left voicemail. Waiting on call back.

## 2023-04-30 NOTE — Procedures (Signed)
 Vascular and Interventional Radiology Procedure Note  Patient: Wendy [name] DOB: 21-Aug-1945 Medical Record Number: [id number] Note Date/Time: 04/30/23 3:34 PM   Performing Physician: Roanna Banning, MD Assistant(s): None  Diagnosis: Lung cancer  Procedure: PORT PLACEMENT  Anesthesia: Conscious Sedation Complications: None Estimated Blood Loss: Minimal  Findings:  Successful right-sided port placement, with the tip of the catheter in the proximal right atrium.  Plan: Catheter ready for use.  See detailed procedure note with images in PACS. The patient tolerated the procedure well without incident or complication and was returned to Recovery in stable condition.    Roanna Banning, MD [facility] Radiology   Pager. [phone] Clinic. [phone]

## 2023-05-01 ENCOUNTER — Inpatient Hospital Stay: Payer: Medicare HMO

## 2023-05-01 ENCOUNTER — Ambulatory Visit: Payer: Medicare HMO

## 2023-05-04 ENCOUNTER — Other Ambulatory Visit: Payer: Medicare HMO

## 2023-05-04 ENCOUNTER — Other Ambulatory Visit: Payer: Self-pay

## 2023-05-04 ENCOUNTER — Inpatient Hospital Stay: Payer: Medicare HMO | Admitting: Internal Medicine

## 2023-05-04 ENCOUNTER — Inpatient Hospital Stay: Payer: Medicare HMO

## 2023-05-04 ENCOUNTER — Other Ambulatory Visit: Payer: Self-pay | Admitting: Internal Medicine

## 2023-05-04 ENCOUNTER — Ambulatory Visit: Payer: Medicare HMO

## 2023-05-04 ENCOUNTER — Encounter: Payer: Self-pay | Admitting: Internal Medicine

## 2023-05-04 ENCOUNTER — Encounter: Payer: Self-pay | Admitting: Medical Oncology

## 2023-05-04 ENCOUNTER — Ambulatory Visit
Admission: RE | Admit: 2023-05-04 | Discharge: 2023-05-04 | Disposition: A | Discharge status: Home / Self Care | Payer: Medicare HMO | Source: Ambulatory Visit | Attending: Radiation Oncology | Admitting: Radiation Oncology

## 2023-05-04 VITALS — BP 180/90 | HR 112 | Temp 97.9°F | Resp 18 | Wt 175.0 lb

## 2023-05-04 VITALS — BP 130/86 | HR 85 | Temp 98.4°F | Resp 18

## 2023-05-04 DIAGNOSIS — Z9221 Personal history of antineoplastic chemotherapy: Secondary | ICD-10-CM | POA: Insufficient documentation

## 2023-05-04 DIAGNOSIS — R5383 Other fatigue: Secondary | ICD-10-CM | POA: Insufficient documentation

## 2023-05-04 DIAGNOSIS — K208 Other esophagitis without bleeding: Secondary | ICD-10-CM | POA: Insufficient documentation

## 2023-05-04 DIAGNOSIS — M069 Rheumatoid arthritis, unspecified: Secondary | ICD-10-CM | POA: Insufficient documentation

## 2023-05-04 DIAGNOSIS — Z886 Allergy status to analgesic agent status: Secondary | ICD-10-CM | POA: Insufficient documentation

## 2023-05-04 DIAGNOSIS — Z95828 Presence of other vascular implants and grafts: Secondary | ICD-10-CM | POA: Insufficient documentation

## 2023-05-04 DIAGNOSIS — I10 Essential (primary) hypertension: Secondary | ICD-10-CM | POA: Insufficient documentation

## 2023-05-04 DIAGNOSIS — R1310 Dysphagia, unspecified: Secondary | ICD-10-CM | POA: Insufficient documentation

## 2023-05-04 DIAGNOSIS — Z885 Allergy status to narcotic agent status: Secondary | ICD-10-CM | POA: Insufficient documentation

## 2023-05-04 DIAGNOSIS — I700 Atherosclerosis of aorta: Secondary | ICD-10-CM | POA: Insufficient documentation

## 2023-05-04 DIAGNOSIS — Z5111 Encounter for antineoplastic chemotherapy: Secondary | ICD-10-CM | POA: Insufficient documentation

## 2023-05-04 DIAGNOSIS — Z79899 Other long term (current) drug therapy: Secondary | ICD-10-CM | POA: Insufficient documentation

## 2023-05-04 DIAGNOSIS — K219 Gastro-esophageal reflux disease without esophagitis: Secondary | ICD-10-CM | POA: Insufficient documentation

## 2023-05-04 DIAGNOSIS — R079 Chest pain, unspecified: Secondary | ICD-10-CM | POA: Insufficient documentation

## 2023-05-04 DIAGNOSIS — Z79630 Long term (current) use of alkylating agent: Secondary | ICD-10-CM | POA: Insufficient documentation

## 2023-05-04 DIAGNOSIS — Z510 Encounter for antineoplastic radiation therapy: Secondary | ICD-10-CM | POA: Diagnosis not present

## 2023-05-04 DIAGNOSIS — M549 Dorsalgia, unspecified: Secondary | ICD-10-CM | POA: Insufficient documentation

## 2023-05-04 DIAGNOSIS — J029 Acute pharyngitis, unspecified: Secondary | ICD-10-CM | POA: Insufficient documentation

## 2023-05-04 DIAGNOSIS — E119 Type 2 diabetes mellitus without complications: Secondary | ICD-10-CM | POA: Insufficient documentation

## 2023-05-04 DIAGNOSIS — C3431 Malignant neoplasm of lower lobe, right bronchus or lung: Secondary | ICD-10-CM | POA: Insufficient documentation

## 2023-05-04 DIAGNOSIS — Z923 Personal history of irradiation: Secondary | ICD-10-CM | POA: Insufficient documentation

## 2023-05-04 DIAGNOSIS — M2550 Pain in unspecified joint: Secondary | ICD-10-CM | POA: Insufficient documentation

## 2023-05-04 DIAGNOSIS — I6782 Cerebral ischemia: Secondary | ICD-10-CM | POA: Insufficient documentation

## 2023-05-04 DIAGNOSIS — Z9889 Other specified postprocedural states: Secondary | ICD-10-CM | POA: Insufficient documentation

## 2023-05-04 DIAGNOSIS — R197 Diarrhea, unspecified: Secondary | ICD-10-CM | POA: Insufficient documentation

## 2023-05-04 DIAGNOSIS — R112 Nausea with vomiting, unspecified: Secondary | ICD-10-CM | POA: Insufficient documentation

## 2023-05-04 DIAGNOSIS — Z79633 Long term (current) use of mitotic inhibitor: Secondary | ICD-10-CM | POA: Insufficient documentation

## 2023-05-04 DIAGNOSIS — F17210 Nicotine dependence, cigarettes, uncomplicated: Secondary | ICD-10-CM | POA: Diagnosis not present

## 2023-05-04 LAB — CBC WITH DIFFERENTIAL (CANCER CENTER ONLY)
Abs Immature Granulocytes: 0.02 10*3/uL (ref 0.00–0.07)
Basophils Absolute: 0 10*3/uL (ref 0.0–0.1)
Basophils Relative: 0 %
Eosinophils Absolute: 0.1 10*3/uL (ref 0.0–0.5)
Eosinophils Relative: 1 %
HCT: 37.6 % (ref 36.0–46.0)
Hemoglobin: 11.7 g/dL — ABNORMAL LOW (ref 12.0–15.0)
Immature Granulocytes: 0 %
Lymphocytes Relative: 21 %
Lymphs Abs: 1.3 10*3/uL (ref 0.7–4.0)
MCH: 24.2 pg — ABNORMAL LOW (ref 26.0–34.0)
MCHC: 31.1 g/dL (ref 30.0–36.0)
MCV: 77.7 fL — ABNORMAL LOW (ref 80.0–100.0)
Monocytes Absolute: 0.8 10*3/uL (ref 0.1–1.0)
Monocytes Relative: 14 %
Neutro Abs: 4 10*3/uL (ref 1.7–7.7)
Neutrophils Relative %: 64 %
Platelet Count: 238 10*3/uL (ref 150–400)
RBC: 4.84 MIL/uL (ref 3.87–5.11)
RDW: 16.7 % — ABNORMAL HIGH (ref 11.5–15.5)
WBC Count: 6.2 10*3/uL (ref 4.0–10.5)
nRBC: 0 % (ref 0.0–0.2)

## 2023-05-04 LAB — CMP (CANCER CENTER ONLY)
ALT: 11 U/L (ref 0–44)
AST: 14 U/L — ABNORMAL LOW (ref 15–41)
Albumin: 3.8 g/dL (ref 3.5–5.0)
Alkaline Phosphatase: 83 U/L (ref 38–126)
Anion gap: 7 (ref 5–15)
BUN: 10 mg/dL (ref 8–23)
CO2: 28 mmol/L (ref 22–32)
Calcium: 9.3 mg/dL (ref 8.9–10.3)
Chloride: 103 mmol/L (ref 98–111)
Creatinine: 0.71 mg/dL (ref 0.44–1.00)
GFR, Estimated: >60 mL/min (ref >60)
Glucose, Bld: 296 mg/dL — ABNORMAL HIGH (ref 70–99)
Potassium: 3.5 mmol/L (ref 3.5–5.1)
Sodium: 138 mmol/L (ref 135–145)
Total Bilirubin: 1 mg/dL (ref 0.0–1.2)
Total Protein: 7.7 g/dL (ref 6.5–8.1)

## 2023-05-04 LAB — RAD ONC ARIA SESSION SUMMARY
Course Elapsed Days: 0
Plan Fractions Treated to Date: 1
Plan Prescribed Dose Per Fraction: 2 Gy
Plan Total Fractions Prescribed: 30
Plan Total Prescribed Dose: 60 Gy
Reference Point Dosage Given to Date: 2 Gy
Reference Point Session Dosage Given: 2 Gy
Session Number: 1

## 2023-05-04 MED ORDER — CLONIDINE HCL 0.1 MG PO TABS
0.2000 mg | ORAL_TABLET | Freq: Once | ORAL | Status: AC
  Administered 2023-05-04: 0.2 mg via ORAL
  Filled 2023-05-04: qty 2

## 2023-05-04 MED ORDER — HEPARIN SOD (PORK) LOCK FLUSH 100 UNIT/ML IV SOLN: 500.0000 [IU] | Freq: Once | INTRAVENOUS | Status: DC | PRN

## 2023-05-04 MED ORDER — SODIUM CHLORIDE 0.9% FLUSH: 10.0000 mL | INTRAVENOUS | Status: DC | PRN

## 2023-05-04 MED ORDER — PALONOSETRON HCL INJECTION 0.25 MG/5ML
0.2500 mg | Freq: Once | INTRAVENOUS | Status: AC
  Administered 2023-05-04: 0.25 mg via INTRAVENOUS
  Filled 2023-05-04: qty 5

## 2023-05-04 MED ORDER — PACLITAXEL CHEMO INJECTION 300 MG/50ML
45.0000 mg/m2 | Freq: Once | INTRAVENOUS | Status: AC
  Administered 2023-05-04: 84 mg via INTRAVENOUS
  Filled 2023-05-04: qty 14

## 2023-05-04 MED ORDER — SODIUM CHLORIDE 0.9 % IV SOLN
170.6000 mg | Freq: Once | INTRAVENOUS | Status: AC
  Administered 2023-05-04: 170 mg via INTRAVENOUS
  Filled 2023-05-04: qty 17

## 2023-05-04 MED ORDER — DEXAMETHASONE SODIUM PHOSPHATE 10 MG/ML IJ SOLN
10.0000 mg | Freq: Once | INTRAMUSCULAR | Status: AC
  Administered 2023-05-04: 10 mg via INTRAVENOUS
  Filled 2023-05-04: qty 1

## 2023-05-04 MED ORDER — SODIUM CHLORIDE 0.9% FLUSH
10.0000 mL | Freq: Once | INTRAVENOUS | Status: AC
Start: 2023-05-04 — End: 2023-05-04
  Administered 2023-05-04: 10 mL

## 2023-05-04 MED ORDER — FAMOTIDINE IN NACL 20-0.9 MG/50ML-% IV SOLN
20.0000 mg | Freq: Once | INTRAVENOUS | Status: AC
  Administered 2023-05-04: 20 mg via INTRAVENOUS
  Filled 2023-05-04: qty 50

## 2023-05-04 MED ORDER — SODIUM CHLORIDE 0.9 % IV SOLN: INTRAVENOUS | Status: DC

## 2023-05-04 MED ORDER — DIPHENHYDRAMINE HCL 50 MG/ML IJ SOLN
50.0000 mg | Freq: Once | INTRAMUSCULAR | Status: AC
  Administered 2023-05-04: 50 mg via INTRAVENOUS
  Filled 2023-05-04: qty 1

## 2023-05-04 NOTE — Patient Instructions (Signed)
 [facility] - A DEPT OF [facility]  Discharge Instructions: Thank you for choosing [facility] to provide your oncology and hematology care.   If you have a lab appointment with the Cancer Center, please go directly to the Cancer Center and check in at the registration area.   Wear comfortable clothing and clothing appropriate for easy access to any Portacath or PICC line.   We strive to give you quality time with your provider. You may need to reschedule your appointment if you arrive late (15 or more minutes).  Arriving late affects you and other patients whose appointments are after yours.  Also, if you miss three or more appointments without notifying the office, you may be dismissed from the clinic at the provider's discretion.      For prescription refill requests, have your pharmacy contact our office and allow 72 hours for refills to be completed.    Today you received the following chemotherapy and/or immunotherapy agents: Paclitaxel, Carboplatin      To help prevent nausea and vomiting after your treatment, we encourage you to take your nausea medication as directed.  BELOW ARE SYMPTOMS THAT SHOULD BE REPORTED IMMEDIATELY: *FEVER GREATER THAN 100.4 F (38 C) OR HIGHER *CHILLS OR SWEATING *NAUSEA AND VOMITING THAT IS NOT CONTROLLED WITH YOUR NAUSEA MEDICATION *UNUSUAL SHORTNESS OF BREATH *UNUSUAL BRUISING OR BLEEDING *URINARY PROBLEMS (pain or burning when urinating, or frequent urination) *BOWEL PROBLEMS (unusual diarrhea, constipation, pain near the anus) TENDERNESS IN MOUTH AND THROAT WITH OR WITHOUT PRESENCE OF ULCERS (sore throat, sores in mouth, or a toothache) UNUSUAL RASH, SWELLING OR PAIN  UNUSUAL VAGINAL DISCHARGE OR ITCHING   Items with * indicate a potential emergency and should be followed up as soon as possible or go to the Emergency Department if any problems should occur.  Please show the CHEMOTHERAPY ALERT CARD or  IMMUNOTHERAPY ALERT CARD at check-in to the Emergency Department and triage nurse.  Should you have questions after your visit or need to cancel or reschedule your appointment, please contact [facility] - A DEPT OF [facility]  Dept: [phone]  and follow the prompts.  Office hours are 8:00 a.m. to 4:30 p.m. Monday - Friday. Please note that voicemails left after 4:00 p.m. may not be returned until the following business day.  We are closed weekends and major holidays. You have access to a nurse at all times for urgent questions. Please call the main number to the clinic Dept: [phone] and follow the prompts.   For any non-urgent questions, you may also contact your provider using MyChart. We now offer e-Visits for anyone 62 and older to request care online for non-urgent symptoms. For details visit [email or web address].   Also download the MyChart app! Go to the app store, search "MyChart", open the app, select , and log in with your MyChart username and password.  Paclitaxel Injection What is this medication? PACLITAXEL (PAK li TAX el) treats some types of cancer. It works by slowing down the growth of cancer cells. This medicine may be used for other purposes; ask your health care provider or pharmacist if you have questions. COMMON BRAND NAME(S): Onxol, Taxol What should I tell my care team before I take this medication? They need to know if you have any of these conditions: Heart disease Liver disease Low white blood cell levels An unusual or allergic reaction to paclitaxel, other medications,  foods, dyes, or preservatives If you or your partner are pregnant or trying to get pregnant Breast-feeding How should I use this medication? This medication is injected into a vein. It is given by your care team in a hospital or clinic setting. Talk to your care team about the use of this medication in children. While it may be given to children  for selected conditions, precautions do apply. Overdosage: If you think you have taken too much of this medicine contact a poison control center or emergency room at once. NOTE: This medicine is only for you. Do not share this medicine with others. What if I miss a dose? Keep appointments for follow-up doses. It is important not to miss your dose. Call your care team if you are unable to keep an appointment. What may interact with this medication? Do not take this medication with any of the following: Live virus vaccines Other medications may affect the way this medication works. Talk with your care team about all of the medications you take. They may suggest changes to your treatment plan to lower the risk of side effects and to make sure your medications work as intended. This list may not describe all possible interactions. Give your health care provider a list of all the medicines, herbs, non-prescription drugs, or dietary supplements you use. Also tell them if you smoke, drink alcohol, or use illegal drugs. Some items may interact with your medicine. What should I watch for while using this medication? Your condition will be monitored carefully while you are receiving this medication. You may need blood work while taking this medication. This medication may make you feel generally unwell. This is not uncommon as chemotherapy can affect healthy cells as well as cancer cells. Report any side effects. Continue your course of treatment even though you feel ill unless your care team tells you to stop. This medication can cause serious allergic reactions. To reduce the risk, your care team may give you other medications to take before receiving this one. Be sure to follow the directions from your care team. This medication may increase your risk of getting an infection. Call your care team for advice if you get a fever, chills, sore throat, or other symptoms of a cold or flu. Do not treat yourself. Try  to avoid being around people who are sick. This medication may increase your risk to bruise or bleed. Call your care team if you notice any unusual bleeding. Be careful brushing or flossing your teeth or using a toothpick because you may get an infection or bleed more easily. If you have any dental work done, tell your dentist you are receiving this medication. Talk to your care team if you may be pregnant. Serious birth defects can occur if you take this medication during pregnancy. Talk to your care team before breastfeeding. Changes to your treatment plan may be needed. What side effects may I notice from receiving this medication? Side effects that you should report to your care team as soon as possible: Allergic reactions--skin rash, itching, hives, swelling of the face, lips, tongue, or throat Heart rhythm changes--fast or irregular heartbeat, dizziness, feeling faint or lightheaded, chest pain, trouble breathing Increase in blood pressure Infection--fever, chills, cough, sore throat, wounds that don't heal, pain or trouble when passing urine, general feeling of discomfort or being unwell Low blood pressure--dizziness, feeling faint or lightheaded, blurry vision Low red blood cell level--unusual weakness or fatigue, dizziness, headache, trouble breathing Painful swelling, warmth, or redness  of the skin, blisters or sores at the infusion site Pain, tingling, or numbness in the hands or feet Slow heartbeat--dizziness, feeling faint or lightheaded, confusion, trouble breathing, unusual weakness or fatigue Unusual bruising or bleeding Side effects that usually do not require medical attention (report to your care team if they continue or are bothersome): Diarrhea Hair loss Joint pain Loss of appetite Muscle pain Nausea Vomiting This list may not describe all possible side effects. Call your doctor for medical advice about side effects. You may report side effects to FDA at  1-800-FDA-1088. Where should I keep my medication? This medication is given in a hospital or clinic. It will not be stored at home. NOTE: This sheet is a summary. It may not cover all possible information. If you have questions about this medicine, talk to your doctor, pharmacist, or health care provider.  2024 Elsevier/Gold Standard (2021-07-09 00:00:00)  Carboplatin Injection What is this medication? CARBOPLATIN (KAR boe pla tin) treats some types of cancer. It works by slowing down the growth of cancer cells. This medicine may be used for other purposes; ask your health care provider or pharmacist if you have questions. COMMON BRAND NAME(S): Paraplatin What should I tell my care team before I take this medication? They need to know if you have any of these conditions: Blood disorders Hearing problems Kidney disease Recent or ongoing radiation therapy An unusual or allergic reaction to carboplatin, cisplatin, other medications, foods, dyes, or preservatives Pregnant or trying to get pregnant Breast-feeding How should I use this medication? This medication is injected into a vein. It is given by your care team in a hospital or clinic setting. Talk to your care team about the use of this medication in children. Special care may be needed. Overdosage: If you think you have taken too much of this medicine contact a poison control center or emergency room at once. NOTE: This medicine is only for you. Do not share this medicine with others. What if I miss a dose? Keep appointments for follow-up doses. It is important not to miss your dose. Call your care team if you are unable to keep an appointment. What may interact with this medication? Medications for seizures Some antibiotics, such as amikacin, gentamicin, neomycin, streptomycin, tobramycin Vaccines This list may not describe all possible interactions. Give your health care provider a list of all the medicines, herbs,  non-prescription drugs, or dietary supplements you use. Also tell them if you smoke, drink alcohol, or use illegal drugs. Some items may interact with your medicine. What should I watch for while using this medication? Your condition will be monitored carefully while you are receiving this medication. You may need blood work while taking this medication. This medication may make you feel generally unwell. This is not uncommon, as chemotherapy can affect healthy cells as well as cancer cells. Report any side effects. Continue your course of treatment even though you feel ill unless your care team tells you to stop. In some cases, you may be given additional medications to help with side effects. Follow all directions for their use. This medication may increase your risk of getting an infection. Call your care team for advice if you get a fever, chills, sore throat, or other symptoms of a cold or flu. Do not treat yourself. Try to avoid being around people who are sick. Avoid taking medications that contain aspirin, acetaminophen, ibuprofen, naproxen, or ketoprofen unless instructed by your care team. These medications may hide a fever. Be careful  brushing or flossing your teeth or using a toothpick because you may get an infection or bleed more easily. If you have any dental work done, tell your dentist you are receiving this medication. Talk to your care team if you wish to become pregnant or think you might be pregnant. This medication can cause serious birth defects. Talk to your care team about effective forms of contraception. Do not breast-feed while taking this medication. What side effects may I notice from receiving this medication? Side effects that you should report to your care team as soon as possible: Allergic reactions--skin rash, itching, hives, swelling of the face, lips, tongue, or throat Infection--fever, chills, cough, sore throat, wounds that don't heal, pain or trouble when passing  urine, general feeling of discomfort or being unwell Low red blood cell level--unusual weakness or fatigue, dizziness, headache, trouble breathing Pain, tingling, or numbness in the hands or feet, muscle weakness, change in vision, confusion or trouble speaking, loss of balance or coordination, trouble walking, seizures Unusual bruising or bleeding Side effects that usually do not require medical attention (report to your care team if they continue or are bothersome): Hair loss Nausea Unusual weakness or fatigue Vomiting This list may not describe all possible side effects. Call your doctor for medical advice about side effects. You may report side effects to FDA at 1-800-FDA-1088. Where should I keep my medication? This medication is given in a hospital or clinic. It will not be stored at home. NOTE: This sheet is a summary. It may not cover all possible information. If you have questions about this medicine, talk to your doctor, pharmacist, or health care provider.  2024 Elsevier/Gold Standard (2021-06-11 00:00:00)

## 2023-05-04 NOTE — Progress Notes (Addendum)
 OK to treat-Per Dr. Arbutus Ped ,it is ok to treat pt today  with Carboplatin and Taxol and pulse =112 and after pt receives clonidine . Pre BP recheck =180/90

## 2023-05-04 NOTE — Progress Notes (Signed)
 [facility] [phone]   [phone]  OFFICE PROGRESS NOTE  [name], MD [address] [place] [address]  DIAGNOSIS:   1) Stage IIIa (T1c, N2, M0) non-small cell lung cancer, adenocarcinoma presented with right lower lobe lung nodule in addition to right paratracheal lymphadenopathy diagnosed in February 2025.  Molecular studies by [id number] showed no actionable mutations and negative PD-L1 expression.   2) history of rheumatoid arthritis and currently on treatment with Plaquenil.  PRIOR THERAPY: None  CURRENT THERAPY: A course of concurrent chemoradiation with weekly carboplatin for AUC of 2 and paclitaxel 45 Mg/M2.  First dose May 04, 2023.  INTERVAL HISTORY: Wendy [name] 78 y.o. female returns to the clinic today for follow-up visit accompanied by her daughter.Discussed the use of AI scribe software for clinical note transcription with the patient, who gave verbal consent to proceed.  History of Present Illness   Wendy [name] is a 78 year old female with stage 3A non-small cell lung cancer adenocarcinoma who presents to start the first dose of chemo radiation. She is accompanied by her daughter.  She was diagnosed with stage 3A non-small cell lung cancer adenocarcinoma in February 2025. A port-a-cath was placed last Thursday to facilitate chemotherapy, and she experienced some initial pain at the site, which has since subsided. There are no signs of infection or complications at the site. She has no chest pain, shortness of breath, cough, nausea, vomiting, or diarrhea. She has been eating to maintain her weight.  She has a history of hypertension, with recent blood pressure readings of 200/102 mmHg and 179/105 mmHg. She did not take her blood pressure medication this morning due to concerns about interactions with chemo radiation. She usually takes her medication daily.       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 Zenovia Jordan     ALLERGIES:  is allergic to aspirin and tramadol.  MEDICATIONS:  Current Outpatient Medications  Medication Sig Dispense Refill   ACCU-CHEK GUIDE TEST test strip USE TO TEST ONCE DAILY 100 strip 1   Accu-Chek Softclix Lancets lancets Test once per day and diagnosis code is E 11.9 and dispense for (ACCU-CHEK Soft Touch) 100 each 12   ALPRAZolam (XANAX) 1 MG tablet TAKE 1 TABLET(1 MG) BY MOUTH AT BEDTIME AS NEEDED FOR ANXIETY OR SLEEP 90 tablet 1   amLODipine (NORVASC) 10 MG tablet Take 1 tablet (10 mg total) by mouth daily. 90 tablet 3   atorvastatin (LIPITOR) 10 MG tablet TAKE 1 TABLET BY MOUTH EVERY DAY 90 tablet 3   Blood Glucose Monitoring Suppl (ACCU-CHEK GUIDE) w/Device KIT Use to test sugars daily.  1 kit 0   Blood Glucose Monitoring Suppl DEVI 1 each by Does not apply route in the morning, at noon, and at bedtime. May substitute to any manufacturer covered by patient's insurance. 1 each 0   Blood Pressure Monitor KIT Use to check blood pressure. 1 kit 0   Cholecalciferol (VITAMIN D3) 20 MCG (800 UNIT) TABS Take 800 Units by mouth daily.     DULoxetine (CYMBALTA) 30 MG capsule Take 1 capsule (30 mg total) by mouth at bedtime.     fluticasone (FLONASE) 50 MCG/ACT nasal spray SPRAY 2 SPRAYS INTO EACH NOSTRIL EVERY DAY 48 mL 3   furosemide  (LASIX) 20 MG tablet Take 1 tablet (20 mg total) by mouth daily as needed (fluid retention). 90 tablet 3   Ginkgo Biloba (GNP GINGKO BILOBA EXTRACT PO) Take by mouth.     glipiZIDE (GLUCOTROL) 10 MG tablet Take 1 tablet (10 mg total) by mouth 2 (two) times daily before a meal. 60 tablet 1   Glucose Blood (BLOOD GLUCOSE TEST STRIPS) STRP 1 each by In Vitro route in the morning, at noon, and at bedtime. May substitute to any manufacturer covered by patient's insurance. 100 each 3   HYDROcodone-acetaminophen (NORCO) 10-325 MG tablet Take 1 tablet by mouth every 6 (six) hours as needed for moderate pain (pain score 4-6). 120 tablet 0   hydroxychloroquine (PLAQUENIL) 200 MG tablet TAKE 1 TABLET(200 MG) BY MOUTH DAILY 90 tablet 1   insulin glargine (LANTUS) 100 UNIT/ML Solostar Pen Inject 25 Units into the skin daily. 15 mL 4   Insulin Pen Needle 32G X 4 MM MISC Use 4x a day 300 each 3   Lancet Device MISC 1 each by Does not apply route in the morning, at noon, and at bedtime. May substitute to any manufacturer covered by patient's insurance. 1 each 0   Lancets (ACCU-CHEK SOFT TOUCH) lancets Test once per day and diagnosis code is E 11.9 and dispense for Aviva plus 100 each 3   Lancets Misc. MISC 1 each by Does not apply route in the morning, at noon, and at bedtime. May substitute to any manufacturer covered by patient's insurance. 100 each 3   levocetirizine (XYZAL) 5 MG tablet Take 1 tablet (5 mg total) by mouth every evening. 90 tablet 3   lidocaine-prilocaine (EMLA) cream Apply to affected area once 30 g 3   metFORMIN (GLUCOPHAGE) 500 MG tablet Take 1 tablet (500 mg total) by mouth 2 (two) times daily with a meal. 180 tablet 3   mupirocin ointment (BACTROBAN) 2 % Apply 1 Application topically 2 (two) times daily. 22 g 5   omeprazole (PRILOSEC) 40 MG capsule TAKE 1 CAPSULE BY MOUTH EVERY DAY 90 capsule 3   ondansetron (ZOFRAN) 4 MG tablet Take 1 tablet (4 mg total) by mouth every 8 (eight) hours as  needed for nausea or vomiting. 90 tablet 3   ondansetron (ZOFRAN) 8 MG tablet Take 1 tablet (8 mg total) by mouth every 8 (eight) hours as needed for nausea or vomiting. Start on the third day after chemotherapy. 30 tablet 1   polyethylene glycol (MIRALAX / GLYCOLAX) 17 g packet Take 17 g by mouth daily as needed for mild constipation.     potassium chloride (KLOR-CON) 10 MEQ tablet Take 1 tablet (10 mEq total) by mouth daily. 90 tablet 3   pregabalin (LYRICA) 300 MG capsule TAKE 1 CAPSULE(300 MG) BY MOUTH AT BEDTIME 90 capsule 0   prochlorperazine (COMPAZINE) 10 MG tablet Take  1 tablet (10 mg total) by mouth every 6 (six) hours as needed for nausea or vomiting. 30 tablet 1   RESTASIS 0.05 % ophthalmic emulsion Place 1 drop into both eyes 2 (two) times daily.     sitaGLIPtin (JANUVIA) 100 MG tablet Take 1 tablet (100 mg total) by mouth at bedtime.     No current facility-administered medications for this visit.    SURGICAL HISTORY:  Past Surgical History:  Procedure Laterality Date   BRONCHIAL BRUSHINGS  04/07/2023   Procedure: BRONCHIAL BRUSHINGS;  Surgeon: Leslye Peer, MD;  Location: [facility] ENDOSCOPY;  Service: Pulmonary;;   BRONCHIAL NEEDLE ASPIRATION BIOPSY  04/07/2023   Procedure: BRONCHIAL NEEDLE ASPIRATION BIOPSIES;  Surgeon: Leslye Peer, MD;  Location: [facility] ENDOSCOPY;  Service: Pulmonary;;   CATARACT EXTRACTION     COLONOSCOPY  11/23/2019   per Dr. Christella Hartigan, adenmatous polyp, repeat in 7 yrs   ESOPHAGOGASTRODUODENOSCOPY  06/20/2008   at [facility], clear    FINE NEEDLE ASPIRATION  04/07/2023   Procedure: FINE NEEDLE ASPIRATION (FNA) LINEAR;  Surgeon: Leslye Peer, MD;  Location: [facility] ENDOSCOPY;  Service: Pulmonary;;   IR IMAGING GUIDED PORT INSERTION  04/30/2023   LUMBAR LAMINECTOMY     1995   ROTATOR CUFF REPAIR Right    SPINAL FUSION N/A 07/29/2022   Procedure: T10-PELVIS INSTRUMENTATION/ T10-L4 POSTERIOR SPINAL FUSION;  Surgeon: London Sheer, MD;  Location: [facility] OR;   Service: Orthopedics;  Laterality: N/A;   UPPER GASTROINTESTINAL ENDOSCOPY  2010   [facility]    VIDEO BRONCHOSCOPY WITH ENDOBRONCHIAL ULTRASOUND N/A 04/07/2023   Procedure: VIDEO BRONCHOSCOPY WITH ENDOBRONCHIAL ULTRASOUND;  Surgeon: Leslye Peer, MD;  Location: [facility] ENDOSCOPY;  Service: Pulmonary;  Laterality: N/A;    REVIEW OF SYSTEMS:  Constitutional: positive for fatigue Eyes: negative Ears, nose, mouth, throat, and face: negative Respiratory: negative Cardiovascular: negative Gastrointestinal: negative Genitourinary:negative Integument/breast: negative Hematologic/lymphatic: negative Musculoskeletal:negative Neurological: negative Behavioral/Psych: negative Endocrine: negative Allergic/Immunologic: negative   PHYSICAL EXAMINATION: General appearance: alert, cooperative, fatigued, and no distress Head: Normocephalic, without obvious abnormality, atraumatic Neck: no adenopathy, no JVD, supple, symmetrical, trachea midline, and thyroid not enlarged, symmetric, no tenderness/mass/nodules Lymph nodes: Cervical, supraclavicular, and axillary nodes normal. Resp: clear to auscultation bilaterally Back: symmetric, no curvature. ROM normal. No CVA tenderness. Cardio: regular rate and rhythm, S1, S2 normal, no murmur, click, rub or gallop GI: soft, non-tender; bowel sounds normal; no masses,  no organomegaly Extremities: extremities normal, atraumatic, no cyanosis or edema Neurologic: Alert and oriented X 3, normal strength and tone. Normal symmetric reflexes. Normal coordination and gait  ECOG PERFORMANCE STATUS: 1 - Symptomatic but completely ambulatory  Blood pressure (!) 179/105, pulse (!) 112, temperature 97.9 F (36.6 C), temperature source Temporal, resp. rate 18, weight 175 lb (79.4 kg), SpO2 99%.  LABORATORY DATA: Lab Results  Component Value Date   WBC 6.2 05/04/2023   HGB 11.7 (L) 05/04/2023   HCT 37.6 05/04/2023   MCV 77.7 (L) 05/04/2023   PLT 238 05/04/2023       Chemistry      Component Value Date/Time   NA 138 04/22/2023 1008   K 3.5 04/22/2023 1008   CL 103 04/22/2023 1008   CO2 27 04/22/2023 1008   BUN 10 04/22/2023 1008   CREATININE 0.78 04/22/2023 1008   CREATININE 1.07 (H) 09/16/2019 1511      Component Value Date/Time   CALCIUM 9.1 04/22/2023 1008   ALKPHOS 96 04/22/2023 1008   AST 12 (L) 04/22/2023 1008  ALT 11 04/22/2023 1008   BILITOT 1.4 (H) 04/22/2023 1008       RADIOGRAPHIC STUDIES: IR IMAGING GUIDED PORT INSERTION Result Date: 04/30/2023 INDICATION: History of lung cancer.  Chemo EXAM: IMPLANTED PORT A CATH PLACEMENT WITH ULTRASOUND AND FLUOROSCOPIC GUIDANCE MEDICATIONS: None ANESTHESIA/SEDATION: Moderate (conscious) sedation was employed during this procedure. A total of Versed 3 mg and Fentanyl 150 mcg was administered intravenously. Moderate Sedation Time: 44 minutes. The patient's level of consciousness and vital signs were monitored continuously by radiology nursing throughout the procedure under my direct supervision. FLUOROSCOPY TIME:  Fluoroscopic dose; 5.9 mGy COMPLICATIONS: None immediate. PROCEDURE: The procedure, risks, benefits, and alternatives were explained to the patient. Questions regarding the procedure were encouraged and answered. The patient understands and consents to the procedure. The neck and chest were prepped with chlorhexidine in a sterile fashion, and a sterile drape was applied covering the operative field. Maximum barrier sterile technique with sterile gowns and gloves were used for the procedure. A timeout was performed prior to the initiation of the procedure. Local anesthesia was provided with 1% lidocaine with epinephrine. The procedure began of the patient's LEFT neck. After creating a small venotomy incision, a micropuncture kit was utilized to access the internal jugular vein under direct, real-time ultrasound guidance. Ultrasound image documentation was performed. The wire however would not  advance into the LEFT SVC. After multiple attempts, the procedure was converted to the patient's RIGHT side At the RIGHT neck, after creating a small venotomy incision, a micropuncture kit was utilized to access the internal jugular vein under direct, real-time ultrasound guidance. Ultrasound image documentation was performed. The microwire was kinked to measure appropriate catheter length. A subcutaneous port pocket was then created along the upper chest wall utilizing a combination of sharp and blunt dissection. The pocket was irrigated with sterile saline. A single lumen power injectable port was chosen for placement. The 8 Fr catheter was tunneled from the port pocket site to the venotomy incision. The port was placed in the pocket. The external catheter was trimmed to appropriate length. At the venotomy, an 8 Fr peel-away sheath was placed over a guidewire under fluoroscopic guidance. The catheter was then placed through the sheath and the sheath was removed. Final catheter positioning was confirmed and documented with a fluoroscopic spot radiograph. The port was accessed with a Huber needle, aspirated and flushed with heparinized saline. The port pocket incision was closed with interrupted 3-0 Vicryl suture then Dermabond was applied, including at the venotomy incision. Dressings were placed. The patient tolerated the procedure well without immediate post procedural complication. IMPRESSION: Successful placement of a RIGHT internal jugular approach power injectable Port-A-Cath. The tip of the catheter is positioned at the superior cavo-atrial junction. The catheter is ready for immediate use. Roanna Banning, MD [facility] Electronically Signed   By: Roanna Banning M.D.   On: 04/30/2023 17:32   MR Brain W Wo Contrast Result Date: 04/24/2023 CLINICAL DATA:  Provided history: Malignant neoplasm of bronchus of right lower lobe. Non-small cell lung cancer,  staging. EXAM: MRI HEAD WITHOUT AND WITH CONTRAST TECHNIQUE: Multiplanar, multiecho pulse sequences of the brain and surrounding structures were obtained without and with intravenous contrast. CONTRAST:  7mL GADAVIST GADOBUTROL 1 MMOL/ML IV SOLN COMPARISON:  Non-contrast head CT 01/10/2023. FINDINGS: Brain: No age-advanced or lobar predominant cerebral atrophy. Multifocal T2 FLAIR hyperintense signal abnormality within the cerebral white matter, nonspecific but compatible with mild-to-moderate chronic small vessel ischemic disease. Nonspecific chronic microhemorrhage  within the left occipital lobe (series 10, image 26). There is no acute infarct. No evidence of an intracranial mass. No extra-axial fluid collection. No midline shift. No pathologic intracranial enhancement identified. Vascular: Maintained flow voids within the proximal large arterial vessels. Small developmental venous anomaly within the left parietal lobe (anatomic variant). Skull and upper cervical spine: No focal worrisome round lesion. Sinuses/Orbits: No mass or acute finding within the imaged orbits. Prior bilateral ocular lens replacement. No significant paranasal sinus disease. IMPRESSION: 1. No evidence of intracranial metastatic disease. 2. Mild-to-moderate chronic small vessel ischemic changes within the cerebral white matter. Electronically Signed   By: Jackey Loge D.O.   On: 04/24/2023 18:11   XR Lumbar Spine Complete Result Date: 04/16/2023 XRs of the lumbar spine taken 04/16/2023 were independently reviewed and interpreted, showing instrumentation from T10-pelvis. Screws appear in appropriate position. No lucency seen around the screws. No kyphosis seen above her fusion. No evidence of instability or translation seen on flexion/extension views. No fracture or dislocation seen. Interbody devices at L2/3 and L3/4 are in satisfactory position.   DG Chest Port 1 View Result Date: 04/07/2023 CLINICAL DATA:  Post bronchoscopy with biopsy.  EXAM: PORTABLE CHEST 1 VIEW COMPARISON:  Radiographs 07/30/2022, CT 04/01/2023 and PET-CT 02/05/2023. FINDINGS: 1355 hours. The known retro hilar right lower lobe pulmonary nodule is not well visualized. The heart size and mediastinal contours are stable with aortic atherosclerosis. Mild atelectasis is present at both lung bases. No evidence of pneumothorax, significant pleural effusion or pulmonary contusion. Postsurgical changes from previous multilevel thoracolumbar fusion. IMPRESSION: No evidence of pneumothorax or other acute complication following bronchoscopy and biopsy. The known right lower lobe pulmonary nodule is not well visualized. Electronically Signed   By: Carey Bullocks M.D.   On: 04/07/2023 15:09   DG C-ARM BRONCHOSCOPY Result Date: 04/07/2023 C-ARM BRONCHOSCOPY: Fluoroscopy was utilized by the requesting physician.  No radiographic interpretation.    ASSESSMENT AND PLAN: This is a very pleasant 78 years old African-American female with Stage IIIa (T1c, N2, M0) non-small cell lung cancer, adenocarcinoma presented with right lower lobe lung nodule in addition to right paratracheal lymphadenopathy diagnosed in February 2025. Molecular studies by [id number] showed no actionable mutations and negative PD-L1 expression. She also has a history of rheumatoid arthritis and currently on treatment with Plaquenil. She is currently undergoing a course of concurrent chemoradiation with weekly carboplatin for AUC of 2 and paclitaxel 45 Mg/M2.  First dose May 04, 2023.    Stage IIIA Non-Small Cell Lung Cancer (Adenocarcinoma) 78 year old female with Stage IIIA non-small cell lung cancer (adenocarcinoma) diagnosed in February 2025, starting first dose of chemoradiation. Reports no new symptoms since last visit. Port-a-cath placed last Thursday with some pain but no complications. Denies chest pain, dyspnea, cough, nausea, vomiting, or diarrhea. Lab work adequate for treatment. Emphasized importance  of maintaining weight during treatment. - Proceed with chemoradiation treatment as planned - Recheck blood pressure before proceeding to the chemo room  Hypertension Hypertension with recent readings of 200/102 mmHg and 179/105 mmHg. Did not take blood pressure medication this morning due to concerns about interactions with chemoradiation. Advised to continue antihypertensive medication regularly regardless of treatment schedule. Will administer clonidine to lower blood pressure before chemoradiation. - Administer clonidine 0.2 mg tablet to lower blood pressure - Recheck blood pressure before proceeding to the chemo room - Instruct patient to take blood pressure medication regularly  Follow-up - Schedule follow-up visit in two weeks.   The patient was advised to call  immediately if she has any concerning symptoms in the interval. The patient voices understanding of current disease status and treatment options and is in agreement with the current care plan.  All questions were answered. The patient knows to call the clinic with any problems, questions or concerns. We can certainly see the patient much sooner if necessary.  The total time spent in the appointment was 30 minutes.  Disclaimer: This note was dictated with voice recognition software. Similar sounding words can inadvertently be transcribed and may not be corrected upon review.

## 2023-05-05 ENCOUNTER — Encounter: Payer: Self-pay | Admitting: Internal Medicine

## 2023-05-05 ENCOUNTER — Ambulatory Visit
Admission: RE | Admit: 2023-05-05 | Discharge: 2023-05-05 | Disposition: A | Discharge status: Home / Self Care | Payer: Medicare HMO | Source: Ambulatory Visit | Attending: Radiation Oncology | Admitting: Radiation Oncology

## 2023-05-05 ENCOUNTER — Other Ambulatory Visit: Payer: Self-pay

## 2023-05-05 DIAGNOSIS — F17210 Nicotine dependence, cigarettes, uncomplicated: Secondary | ICD-10-CM | POA: Diagnosis not present

## 2023-05-05 DIAGNOSIS — Z510 Encounter for antineoplastic radiation therapy: Secondary | ICD-10-CM | POA: Diagnosis not present

## 2023-05-05 DIAGNOSIS — C3431 Malignant neoplasm of lower lobe, right bronchus or lung: Secondary | ICD-10-CM | POA: Diagnosis not present

## 2023-05-05 LAB — RAD ONC ARIA SESSION SUMMARY
Course Elapsed Days: 1
Plan Fractions Treated to Date: 2
Plan Prescribed Dose Per Fraction: 2 Gy
Plan Total Fractions Prescribed: 30
Plan Total Prescribed Dose: 60 Gy
Reference Point Dosage Given to Date: 4 Gy
Reference Point Session Dosage Given: 2 Gy
Session Number: 2

## 2023-05-05 NOTE — Telephone Encounter (Signed)
-----   Message from Nurse Threasa Beards sent at 05/04/2023  2:22 PM EST ----- Regarding: Dr Arbutus Ped pt, first time Paclitaxel, Carboplatin Wendy [name] pt came in 05/04/23 for first time Paclitaxel/Carboplatin. Tolerated infusions well. Needs call back.

## 2023-05-05 NOTE — Telephone Encounter (Signed)
 Called & left message for pt to return call to Dr Asa Lente nurse to let us know how she is doing post chemo.

## 2023-05-06 ENCOUNTER — Ambulatory Visit
Admission: RE | Admit: 2023-05-06 | Discharge: 2023-05-06 | Disposition: A | Discharge status: Home / Self Care | Payer: Medicare HMO | Source: Ambulatory Visit | Attending: Radiation Oncology | Admitting: Radiation Oncology

## 2023-05-06 ENCOUNTER — Other Ambulatory Visit: Payer: Self-pay

## 2023-05-06 DIAGNOSIS — Z510 Encounter for antineoplastic radiation therapy: Secondary | ICD-10-CM | POA: Diagnosis not present

## 2023-05-06 DIAGNOSIS — F17210 Nicotine dependence, cigarettes, uncomplicated: Secondary | ICD-10-CM | POA: Diagnosis not present

## 2023-05-06 DIAGNOSIS — C3431 Malignant neoplasm of lower lobe, right bronchus or lung: Secondary | ICD-10-CM | POA: Diagnosis not present

## 2023-05-06 LAB — RAD ONC ARIA SESSION SUMMARY
Course Elapsed Days: 2
Plan Fractions Treated to Date: 3
Plan Prescribed Dose Per Fraction: 2 Gy
Plan Total Fractions Prescribed: 30
Plan Total Prescribed Dose: 60 Gy
Reference Point Dosage Given to Date: 6 Gy
Reference Point Session Dosage Given: 2 Gy
Session Number: 3

## 2023-05-07 ENCOUNTER — Other Ambulatory Visit: Payer: Self-pay

## 2023-05-07 ENCOUNTER — Ambulatory Visit: Payer: Self-pay | Admitting: Family Medicine

## 2023-05-07 ENCOUNTER — Ambulatory Visit
Admission: RE | Admit: 2023-05-07 | Discharge: 2023-05-07 | Disposition: A | Discharge status: Home / Self Care | Payer: Medicare HMO | Source: Ambulatory Visit | Attending: Radiation Oncology | Admitting: Radiation Oncology

## 2023-05-07 DIAGNOSIS — C3431 Malignant neoplasm of lower lobe, right bronchus or lung: Secondary | ICD-10-CM | POA: Diagnosis not present

## 2023-05-07 DIAGNOSIS — F17210 Nicotine dependence, cigarettes, uncomplicated: Secondary | ICD-10-CM | POA: Diagnosis not present

## 2023-05-07 DIAGNOSIS — Z510 Encounter for antineoplastic radiation therapy: Secondary | ICD-10-CM | POA: Diagnosis not present

## 2023-05-07 LAB — RAD ONC ARIA SESSION SUMMARY
Course Elapsed Days: 3
Plan Fractions Treated to Date: 4
Plan Prescribed Dose Per Fraction: 2 Gy
Plan Total Fractions Prescribed: 30
Plan Total Prescribed Dose: 60 Gy
Reference Point Dosage Given to Date: 8 Gy
Reference Point Session Dosage Given: 2 Gy
Session Number: 4

## 2023-05-07 NOTE — Telephone Encounter (Signed)
 1st attempt. LVM for patient to call back. Patient has refills available on [facility]vastatin at [facility], Xyzal at [facility] (she can request from pharmacy to have prescription moved to [facility] if she would like), and was prescribed a 90 day supply of Lyrica on 04/21/23.  Copied from CRM [id number]. Topic: Clinical - Medication Refill >> May 07, 2023  1:31 PM [place] A wrote: Most Recent Primary Care Visit:  Provider: Gershon Crane A  Department: [facility]  Visit Type: MYCHART VIDEO VISIT  Date: 03/24/2023  Medication: levocetirizine (XYZAL) 5 MG tablet; atorvastatin (LIPITOR) 10 MG tablet; pregabalin (LYRICA) 300 MG capsule  Has the patient contacted their pharmacy? No (Agent: If no, request that the patient contact the pharmacy for the refill. If patient does not wish to contact the pharmacy document the reason why and proceed with request.) (Agent: If yes, when and what did the pharmacy advise?)  Is this the correct pharmacy for this prescription? Yes If no, delete pharmacy and type the correct one.  This is the patient's preferred pharmacy:  [phone] [place], [place] - [address] [place] [address] Phone: [phone] Fax: [phone]   Has the prescription been filled recently? No  Is the patient out of the medication? Yes  Has the patient been seen for an appointment in the last year OR does the patient have an upcoming appointment? Yes  Can we respond through MyChart? Yes  Agent: Please be advised that Rx refills may take up to 3 business days. We ask that you follow-up with your pharmacy.

## 2023-05-08 ENCOUNTER — Ambulatory Visit: Payer: Medicare HMO

## 2023-05-08 ENCOUNTER — Ambulatory Visit
Admission: RE | Admit: 2023-05-08 | Discharge: 2023-05-08 | Disposition: A | Discharge status: Home / Self Care | Payer: Medicare HMO | Source: Ambulatory Visit | Attending: Radiation Oncology | Admitting: Radiation Oncology

## 2023-05-08 ENCOUNTER — Ambulatory Visit
Admission: RE | Admit: 2023-05-08 | Discharge: 2023-05-08 | Disposition: A | Discharge status: Home / Self Care | Source: Ambulatory Visit | Attending: Radiation Oncology | Admitting: Radiation Oncology

## 2023-05-08 ENCOUNTER — Other Ambulatory Visit: Payer: Self-pay

## 2023-05-08 DIAGNOSIS — C3431 Malignant neoplasm of lower lobe, right bronchus or lung: Secondary | ICD-10-CM | POA: Diagnosis not present

## 2023-05-08 DIAGNOSIS — F17210 Nicotine dependence, cigarettes, uncomplicated: Secondary | ICD-10-CM | POA: Diagnosis not present

## 2023-05-08 DIAGNOSIS — Z510 Encounter for antineoplastic radiation therapy: Secondary | ICD-10-CM | POA: Diagnosis not present

## 2023-05-08 LAB — RAD ONC ARIA SESSION SUMMARY
Course Elapsed Days: 4
Plan Fractions Treated to Date: 5
Plan Prescribed Dose Per Fraction: 2 Gy
Plan Total Fractions Prescribed: 30
Plan Total Prescribed Dose: 60 Gy
Reference Point Dosage Given to Date: 10 Gy
Reference Point Session Dosage Given: 2 Gy
Session Number: 5

## 2023-05-08 MED ORDER — SONAFINE EX EMUL
1.0000 | Freq: Once | CUTANEOUS | Status: AC
  Administered 2023-05-08: 1 via TOPICAL

## 2023-05-11 ENCOUNTER — Inpatient Hospital Stay: Payer: Medicare HMO

## 2023-05-11 ENCOUNTER — Other Ambulatory Visit: Payer: Self-pay

## 2023-05-11 ENCOUNTER — Ambulatory Visit
Admission: RE | Admit: 2023-05-11 | Discharge: 2023-05-11 | Disposition: A | Discharge status: Home / Self Care | Payer: Medicare HMO | Source: Ambulatory Visit | Attending: Radiation Oncology | Admitting: Radiation Oncology

## 2023-05-11 VITALS — BP 151/86 | HR 95 | Temp 97.9°F | Resp 20 | Wt 167.8 lb

## 2023-05-11 DIAGNOSIS — C3431 Malignant neoplasm of lower lobe, right bronchus or lung: Secondary | ICD-10-CM

## 2023-05-11 DIAGNOSIS — Z510 Encounter for antineoplastic radiation therapy: Secondary | ICD-10-CM | POA: Diagnosis not present

## 2023-05-11 DIAGNOSIS — Z95828 Presence of other vascular implants and grafts: Secondary | ICD-10-CM

## 2023-05-11 DIAGNOSIS — F17210 Nicotine dependence, cigarettes, uncomplicated: Secondary | ICD-10-CM | POA: Diagnosis not present

## 2023-05-11 LAB — CBC WITH DIFFERENTIAL (CANCER CENTER ONLY)
Abs Immature Granulocytes: 0.03 10*3/uL (ref 0.00–0.07)
Basophils Absolute: 0 10*3/uL (ref 0.0–0.1)
Basophils Relative: 1 %
Eosinophils Absolute: 0 10*3/uL (ref 0.0–0.5)
Eosinophils Relative: 1 %
HCT: 34.4 % — ABNORMAL LOW (ref 36.0–46.0)
Hemoglobin: 10.9 g/dL — ABNORMAL LOW (ref 12.0–15.0)
Immature Granulocytes: 1 %
Lymphocytes Relative: 24 %
Lymphs Abs: 1 10*3/uL (ref 0.7–4.0)
MCH: 24.9 pg — ABNORMAL LOW (ref 26.0–34.0)
MCHC: 31.7 g/dL (ref 30.0–36.0)
MCV: 78.7 fL — ABNORMAL LOW (ref 80.0–100.0)
Monocytes Absolute: 0.4 10*3/uL (ref 0.1–1.0)
Monocytes Relative: 10 %
Neutro Abs: 2.7 10*3/uL (ref 1.7–7.7)
Neutrophils Relative %: 63 %
Platelet Count: 217 10*3/uL (ref 150–400)
RBC: 4.37 MIL/uL (ref 3.87–5.11)
RDW: 16.4 % — ABNORMAL HIGH (ref 11.5–15.5)
WBC Count: 4.2 10*3/uL (ref 4.0–10.5)
nRBC: 0 % (ref 0.0–0.2)

## 2023-05-11 LAB — CMP (CANCER CENTER ONLY)
ALT: 12 U/L (ref 0–44)
AST: 12 U/L — ABNORMAL LOW (ref 15–41)
Albumin: 3.7 g/dL (ref 3.5–5.0)
Alkaline Phosphatase: 77 U/L (ref 38–126)
Anion gap: 5 (ref 5–15)
BUN: 12 mg/dL (ref 8–23)
CO2: 25 mmol/L (ref 22–32)
Calcium: 8.7 mg/dL — ABNORMAL LOW (ref 8.9–10.3)
Chloride: 106 mmol/L (ref 98–111)
Creatinine: 0.78 mg/dL (ref 0.44–1.00)
GFR, Estimated: >60 mL/min (ref >60)
Glucose, Bld: 269 mg/dL — ABNORMAL HIGH (ref 70–99)
Potassium: 4 mmol/L (ref 3.5–5.1)
Sodium: 136 mmol/L (ref 135–145)
Total Bilirubin: 0.6 mg/dL (ref 0.0–1.2)
Total Protein: 7.1 g/dL (ref 6.5–8.1)

## 2023-05-11 LAB — RAD ONC ARIA SESSION SUMMARY
Course Elapsed Days: 7
Plan Fractions Treated to Date: 6
Plan Prescribed Dose Per Fraction: 2 Gy
Plan Total Fractions Prescribed: 30
Plan Total Prescribed Dose: 60 Gy
Reference Point Dosage Given to Date: 12 Gy
Reference Point Session Dosage Given: 2 Gy
Session Number: 6

## 2023-05-11 MED ORDER — PALONOSETRON HCL INJECTION 0.25 MG/5ML
0.2500 mg | Freq: Once | INTRAVENOUS | Status: AC
  Administered 2023-05-11: 0.25 mg via INTRAVENOUS
  Filled 2023-05-11: qty 5

## 2023-05-11 MED ORDER — DIPHENHYDRAMINE HCL 50 MG/ML IJ SOLN
25.0000 mg | Freq: Once | INTRAMUSCULAR | Status: AC
  Administered 2023-05-11: 25 mg via INTRAVENOUS
  Filled 2023-05-11: qty 1

## 2023-05-11 MED ORDER — HEPARIN SOD (PORK) LOCK FLUSH 100 UNIT/ML IV SOLN
500.0000 [IU] | Freq: Once | INTRAVENOUS | Status: AC | PRN
  Administered 2023-05-11: 500 [IU]

## 2023-05-11 MED ORDER — DEXAMETHASONE SODIUM PHOSPHATE 10 MG/ML IJ SOLN
10.0000 mg | Freq: Once | INTRAMUSCULAR | Status: AC
  Administered 2023-05-11: 10 mg via INTRAVENOUS
  Filled 2023-05-11: qty 1

## 2023-05-11 MED ORDER — SODIUM CHLORIDE 0.9% FLUSH
10.0000 mL | INTRAVENOUS | Status: DC | PRN
  Administered 2023-05-11: 10 mL

## 2023-05-11 MED ORDER — SODIUM CHLORIDE 0.9 % IV SOLN
170.6000 mg | Freq: Once | INTRAVENOUS | Status: AC
  Administered 2023-05-11: 170 mg via INTRAVENOUS
  Filled 2023-05-11: qty 17

## 2023-05-11 MED ORDER — SODIUM CHLORIDE 0.9 % IV SOLN: INTRAVENOUS | Status: DC

## 2023-05-11 MED ORDER — SODIUM CHLORIDE 0.9% FLUSH
10.0000 mL | Freq: Once | INTRAVENOUS | Status: AC
  Administered 2023-05-11: 10 mL

## 2023-05-11 MED ORDER — FAMOTIDINE IN NACL 20-0.9 MG/50ML-% IV SOLN
20.0000 mg | Freq: Once | INTRAVENOUS | Status: AC
  Administered 2023-05-11: 20 mg via INTRAVENOUS
  Filled 2023-05-11: qty 50

## 2023-05-11 MED ORDER — SODIUM CHLORIDE 0.9 % IV SOLN
45.0000 mg/m2 | Freq: Once | INTRAVENOUS | Status: AC
  Administered 2023-05-11: 84 mg via INTRAVENOUS
  Filled 2023-05-11: qty 14

## 2023-05-11 NOTE — Patient Instructions (Addendum)
 [facility] - A DEPT OF [facility]  Discharge Instructions: Thank you for choosing [facility] to provide your oncology and hematology care.   If you have a lab appointment with the Cancer Center, please go directly to the Cancer Center and check in at the registration area.   Wear comfortable clothing and clothing appropriate for easy access to any Portacath or PICC line.   We strive to give you quality time with your provider. You may need to reschedule your appointment if you arrive late (15 or more minutes).  Arriving late affects you and other patients whose appointments are after yours.  Also, if you miss three or more appointments without notifying the office, you may be dismissed from the clinic at the provider's discretion.      For prescription refill requests, have your pharmacy contact our office and allow 72 hours for refills to be completed.    Today you received the following chemotherapy and/or immunotherapy agents: Paclitaxel, Carboplatin.       To help prevent nausea and vomiting after your treatment, we encourage you to take your nausea medication as directed.  BELOW ARE SYMPTOMS THAT SHOULD BE REPORTED IMMEDIATELY: *FEVER GREATER THAN 100.4 F (38 C) OR HIGHER *CHILLS OR SWEATING *NAUSEA AND VOMITING THAT IS NOT CONTROLLED WITH YOUR NAUSEA MEDICATION *UNUSUAL SHORTNESS OF BREATH *UNUSUAL BRUISING OR BLEEDING *URINARY PROBLEMS (pain or burning when urinating, or frequent urination) *BOWEL PROBLEMS (unusual diarrhea, constipation, pain near the anus) TENDERNESS IN MOUTH AND THROAT WITH OR WITHOUT PRESENCE OF ULCERS (sore throat, sores in mouth, or a toothache) UNUSUAL RASH, SWELLING OR PAIN  UNUSUAL VAGINAL DISCHARGE OR ITCHING   Items with * indicate a potential emergency and should be followed up as soon as possible or go to the Emergency Department if any problems should occur.  Please show the CHEMOTHERAPY ALERT CARD  or IMMUNOTHERAPY ALERT CARD at check-in to the Emergency Department and triage nurse.  Should you have questions after your visit or need to cancel or reschedule your appointment, please contact [facility] - A DEPT OF [facility]  Dept: [phone]  and follow the prompts.  Office hours are 8:00 a.m. to 4:30 p.m. Monday - Friday. Please note that voicemails left after 4:00 p.m. may not be returned until the following business day.  We are closed weekends and major holidays. You have access to a nurse at all times for urgent questions. Please call the main number to the clinic Dept: [phone] and follow the prompts.

## 2023-05-12 ENCOUNTER — Other Ambulatory Visit: Payer: Self-pay

## 2023-05-12 ENCOUNTER — Ambulatory Visit
Admission: RE | Admit: 2023-05-12 | Discharge: 2023-05-12 | Disposition: A | Discharge status: Home / Self Care | Payer: Medicare HMO | Source: Ambulatory Visit | Attending: Radiation Oncology

## 2023-05-12 DIAGNOSIS — C3431 Malignant neoplasm of lower lobe, right bronchus or lung: Secondary | ICD-10-CM | POA: Diagnosis not present

## 2023-05-12 DIAGNOSIS — Z510 Encounter for antineoplastic radiation therapy: Secondary | ICD-10-CM | POA: Diagnosis not present

## 2023-05-12 DIAGNOSIS — F17210 Nicotine dependence, cigarettes, uncomplicated: Secondary | ICD-10-CM | POA: Diagnosis not present

## 2023-05-12 LAB — RAD ONC ARIA SESSION SUMMARY
Course Elapsed Days: 8
Plan Fractions Treated to Date: 7
Plan Prescribed Dose Per Fraction: 2 Gy
Plan Total Fractions Prescribed: 30
Plan Total Prescribed Dose: 60 Gy
Reference Point Dosage Given to Date: 14 Gy
Reference Point Session Dosage Given: 2 Gy
Session Number: 7

## 2023-05-13 ENCOUNTER — Other Ambulatory Visit: Payer: Self-pay | Admitting: Family Medicine

## 2023-05-13 ENCOUNTER — Other Ambulatory Visit: Payer: Self-pay

## 2023-05-13 ENCOUNTER — Telehealth: Payer: Self-pay

## 2023-05-13 ENCOUNTER — Ambulatory Visit
Admission: RE | Admit: 2023-05-13 | Discharge: 2023-05-13 | Disposition: A | Discharge status: Home / Self Care | Payer: Medicare HMO | Source: Ambulatory Visit | Attending: Radiation Oncology | Admitting: Radiation Oncology

## 2023-05-13 ENCOUNTER — Telehealth: Payer: Self-pay | Admitting: *Deleted

## 2023-05-13 ENCOUNTER — Telehealth: Payer: Self-pay | Admitting: Family Medicine

## 2023-05-13 DIAGNOSIS — Z510 Encounter for antineoplastic radiation therapy: Secondary | ICD-10-CM | POA: Diagnosis not present

## 2023-05-13 DIAGNOSIS — F17210 Nicotine dependence, cigarettes, uncomplicated: Secondary | ICD-10-CM | POA: Diagnosis not present

## 2023-05-13 DIAGNOSIS — C3431 Malignant neoplasm of lower lobe, right bronchus or lung: Secondary | ICD-10-CM | POA: Diagnosis not present

## 2023-05-13 LAB — RAD ONC ARIA SESSION SUMMARY
Course Elapsed Days: 9
Plan Fractions Treated to Date: 8
Plan Prescribed Dose Per Fraction: 2 Gy
Plan Total Fractions Prescribed: 30
Plan Total Prescribed Dose: 60 Gy
Reference Point Dosage Given to Date: 16 Gy
Reference Point Session Dosage Given: 2 Gy
Session Number: 8

## 2023-05-13 MED ORDER — LEVOCETIRIZINE DIHYDROCHLORIDE 5 MG PO TABS: 5.0000 mg | ORAL_TABLET | Freq: Every evening | ORAL | 3 refills | Status: AC

## 2023-05-13 MED ORDER — HYDROCODONE-ACETAMINOPHEN 10-325 MG PO TABS
1.0000 | ORAL_TABLET | Freq: Four times a day (QID) | ORAL | 0 refills | Status: DC | PRN
Start: 2023-05-13 — End: 2023-06-23

## 2023-05-13 NOTE — Telephone Encounter (Signed)
 Copied from CRM [id number]. Topic: Clinical - Medication Refill >> May 13, 2023 11:57 AM Elizebeth Brooking wrote: Most Recent Primary Care Visit:  Provider: Gershon Crane A  Department: [facility]  Visit Type: MYCHART VIDEO VISIT  Date: 03/24/2023  Medication: HYDROcodone-acetaminophen (NORCO) 10-325 MG tablet  Has the patient contacted their pharmacy? Yes (Agent: If no, request that the patient contact the pharmacy for the refill. If patient does not wish to contact the pharmacy document the reason why and proceed with request.) (Agent: If yes, when and what did the pharmacy advise?)  Is this the correct pharmacy for this prescription? Yes If no, delete pharmacy and type the correct one.  This is the patient's preferred pharmacy:  [phone], [place] - [address] [place] [address] Phone: [phone] Fax: [phone]   Has the prescription been filled recently? No  Is the patient out of the medication? Yes  Has the patient been seen for an appointment in the last year OR does the patient have an upcoming appointment? Yes  Can we respond through MyChart? Yes  Agent: Please be advised that Rx refills may take up to 3 business days. We ask that you follow-up with your pharmacy.

## 2023-05-13 NOTE — Telephone Encounter (Signed)
 Copied from CRM [id number]. Topic: Clinical - Medication Question >> May 13, 2023 11:43 AM Almira Coaster wrote: Reason for CRM: Patient is calling to follow up on a medication refill request that was placed on 05/07/2023. [facility] [address]. She would like all prescriptions to be transferred.

## 2023-05-13 NOTE — Telephone Encounter (Signed)
":  SHAWNDRA CLUTE [phone]) calling to let you know someone sent a text message to my number about an appointment.  You are testing the wrong number."  Advised unable to send text from our [employer].  This nurse called you about the envelope with financial assistance ideas that was left behind.  "Leave it at the front desk for me to pick up tomorrow.  Thank you for your help.  The financial navigator went over my bills, gave me all my information I have taken to Social Security,"  No further questions or needs.  [name]

## 2023-05-13 NOTE — Telephone Encounter (Signed)
 Copied from CRM [id number]. Topic: General - Other >> May 13, 2023  3:43 PM Turkey A wrote: Reason for CRM: Patient called and said someone called her -please call patient back

## 2023-05-13 NOTE — Telephone Encounter (Signed)
 [name] in to see form staff requesting "copy of all medical bills from 2023 to present.  Need a copy today.  Trying to get help, was advised to provide Medicare and SSA with bills.  Able to receive financial assistance with at least $6,000.00 of unpaid bills.  Had bronchoscopy, back surgery, chemotherapy, and radiation bills but unable to find any mail at home.  Advised a Medicare-Medicaid [place] may be needed to pay bills but I also have a home.  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 Do I need to go "   Advised billing is managed by [facility] Patient Accounting.  Provided Phone: [phone].  Provided [facility] phone:[phone].  Provided "[facility]" phone: [phone] and to look for other resources on [employer] site.  Connected with patient accounting representative Lyla Son [phone]) to ask what steps [name] may take to obtain billing information today instead of mail or e-mail.  "Go to any [facility] site.  Ask for the Financial Navigator Department, then ask for bills.  Navigator can print them."   . No further questions or needs.  Patient in route to [facility] main entrance.   Message left to return to pick up envelope of [facility] information left behind in office.[name]

## 2023-05-14 ENCOUNTER — Other Ambulatory Visit: Payer: Self-pay

## 2023-05-14 ENCOUNTER — Ambulatory Visit
Admission: RE | Admit: 2023-05-14 | Discharge: 2023-05-14 | Disposition: A | Discharge status: Home / Self Care | Source: Ambulatory Visit | Attending: Radiation Oncology | Admitting: Radiation Oncology

## 2023-05-14 ENCOUNTER — Ambulatory Visit
Admission: RE | Admit: 2023-05-14 | Discharge: 2023-05-14 | Disposition: A | Discharge status: Home / Self Care | Payer: Medicare HMO | Source: Ambulatory Visit | Attending: Radiation Oncology | Admitting: Radiation Oncology

## 2023-05-14 DIAGNOSIS — C3431 Malignant neoplasm of lower lobe, right bronchus or lung: Secondary | ICD-10-CM | POA: Diagnosis not present

## 2023-05-14 DIAGNOSIS — F17210 Nicotine dependence, cigarettes, uncomplicated: Secondary | ICD-10-CM | POA: Diagnosis not present

## 2023-05-14 DIAGNOSIS — Z510 Encounter for antineoplastic radiation therapy: Secondary | ICD-10-CM | POA: Diagnosis not present

## 2023-05-14 LAB — RAD ONC ARIA SESSION SUMMARY
Course Elapsed Days: 10
Plan Fractions Treated to Date: 9
Plan Prescribed Dose Per Fraction: 2 Gy
Plan Total Fractions Prescribed: 30
Plan Total Prescribed Dose: 60 Gy
Reference Point Dosage Given to Date: 18 Gy
Reference Point Session Dosage Given: 2 Gy
Session Number: 9

## 2023-05-15 ENCOUNTER — Ambulatory Visit
Admission: RE | Admit: 2023-05-15 | Discharge: 2023-05-15 | Disposition: A | Discharge status: Home / Self Care | Payer: Medicare HMO | Source: Ambulatory Visit | Attending: Radiation Oncology | Admitting: Radiation Oncology

## 2023-05-15 ENCOUNTER — Other Ambulatory Visit: Payer: Self-pay

## 2023-05-15 DIAGNOSIS — Z510 Encounter for antineoplastic radiation therapy: Secondary | ICD-10-CM | POA: Diagnosis not present

## 2023-05-15 DIAGNOSIS — C3431 Malignant neoplasm of lower lobe, right bronchus or lung: Secondary | ICD-10-CM | POA: Diagnosis not present

## 2023-05-15 DIAGNOSIS — F17210 Nicotine dependence, cigarettes, uncomplicated: Secondary | ICD-10-CM | POA: Diagnosis not present

## 2023-05-15 LAB — RAD ONC ARIA SESSION SUMMARY
Course Elapsed Days: 11
Plan Fractions Treated to Date: 10
Plan Prescribed Dose Per Fraction: 2 Gy
Plan Total Fractions Prescribed: 30
Plan Total Prescribed Dose: 60 Gy
Reference Point Dosage Given to Date: 20 Gy
Reference Point Session Dosage Given: 2 Gy
Session Number: 10

## 2023-05-15 NOTE — Telephone Encounter (Signed)
 Pt Rx sent to the pharmacy requested

## 2023-05-15 NOTE — Telephone Encounter (Signed)
 Rx sent in to requested pharmacy.

## 2023-05-18 ENCOUNTER — Other Ambulatory Visit: Payer: Self-pay | Admitting: Family Medicine

## 2023-05-18 ENCOUNTER — Other Ambulatory Visit: Payer: Self-pay

## 2023-05-18 ENCOUNTER — Ambulatory Visit
Admission: RE | Admit: 2023-05-18 | Discharge: 2023-05-18 | Disposition: A | Discharge status: Home / Self Care | Payer: Medicare HMO | Source: Ambulatory Visit | Attending: Radiation Oncology

## 2023-05-18 DIAGNOSIS — C3431 Malignant neoplasm of lower lobe, right bronchus or lung: Secondary | ICD-10-CM | POA: Diagnosis not present

## 2023-05-18 DIAGNOSIS — F17210 Nicotine dependence, cigarettes, uncomplicated: Secondary | ICD-10-CM | POA: Diagnosis not present

## 2023-05-18 DIAGNOSIS — Z510 Encounter for antineoplastic radiation therapy: Secondary | ICD-10-CM | POA: Diagnosis not present

## 2023-05-18 LAB — RAD ONC ARIA SESSION SUMMARY
Course Elapsed Days: 14
Plan Fractions Treated to Date: 11
Plan Prescribed Dose Per Fraction: 2 Gy
Plan Total Fractions Prescribed: 30
Plan Total Prescribed Dose: 60 Gy
Reference Point Dosage Given to Date: 22 Gy
Reference Point Session Dosage Given: 2 Gy
Session Number: 11

## 2023-05-18 MED ORDER — SITAGLIPTIN PHOSPHATE 100 MG PO TABS: 100.0000 mg | ORAL_TABLET | Freq: Every evening | ORAL | Status: DC

## 2023-05-18 NOTE — Telephone Encounter (Signed)
 Copied from CRM [id number]. Topic: Clinical - Medication Refill >> May 18, 2023 10:18 AM Denese Killings wrote: Most Recent Primary Care Visit:  Provider: Gershon Crane A  Department: [facility]  Visit Type: MYCHART VIDEO VISIT  Date: 03/24/2023  Medication: sitaGLIPtin (JANUVIA) 100 MG tablet   Has the patient contacted their pharmacy? No (Agent: If no, request that the patient contact the pharmacy for the refill. If patient does not wish to contact the pharmacy document the reason why and proceed with request.) (Agent: If yes, when and what did the pharmacy advise * [facility]  Is this the correct pharmacy for this prescription? Yes If no, delete pharmacy and type the correct one.  This is the patient's preferred pharmacy:  [phone], [place] - [address] [place] [address] Phone: [phone] Fax: [phone]   Has the prescription been filled recently? Yes  Is the patient out of the medication? Yes  Has the patient been seen for an appointment in the last year OR does the patient have an upcoming appointment? Yes  Can we respond through MyChart? Yes  Agent: Please be advised that Rx refills may take up to 3 business days. We ask that you follow-up with your pharmacy.

## 2023-05-18 NOTE — Telephone Encounter (Signed)
 Copied from CRM [id number]. Topic: Clinical - Medication Question >> May 18, 2023 10:14 AM Denese Killings wrote: Reason for CRM: Patient states that pregabalin (LYRICA) 300 MG capsule makes her legs really weak. She states that she fell 3-4 times on Saturday and hit her head. Pregablin is too strong , patient wants to reduce her MG from 300 to 100.

## 2023-05-19 ENCOUNTER — Other Ambulatory Visit: Payer: Self-pay

## 2023-05-19 ENCOUNTER — Telehealth: Payer: Self-pay

## 2023-05-19 ENCOUNTER — Inpatient Hospital Stay: Payer: Medicare HMO

## 2023-05-19 ENCOUNTER — Ambulatory Visit
Admission: RE | Admit: 2023-05-19 | Discharge: 2023-05-19 | Disposition: A | Discharge status: Home / Self Care | Payer: Medicare HMO | Source: Ambulatory Visit | Attending: Radiation Oncology

## 2023-05-19 ENCOUNTER — Inpatient Hospital Stay (HOSPITAL_BASED_OUTPATIENT_CLINIC_OR_DEPARTMENT_OTHER): Payer: Medicare HMO | Admitting: Internal Medicine

## 2023-05-19 VITALS — BP 162/96 | HR 99 | Temp 97.9°F | Resp 16 | Ht 63.25 in | Wt 179.2 lb

## 2023-05-19 DIAGNOSIS — F17210 Nicotine dependence, cigarettes, uncomplicated: Secondary | ICD-10-CM | POA: Diagnosis not present

## 2023-05-19 DIAGNOSIS — C3431 Malignant neoplasm of lower lobe, right bronchus or lung: Secondary | ICD-10-CM

## 2023-05-19 DIAGNOSIS — Z510 Encounter for antineoplastic radiation therapy: Secondary | ICD-10-CM | POA: Diagnosis not present

## 2023-05-19 LAB — CBC WITH DIFFERENTIAL (CANCER CENTER ONLY)
Abs Immature Granulocytes: 0.03 10*3/uL (ref 0.00–0.07)
Basophils Absolute: 0 10*3/uL (ref 0.0–0.1)
Basophils Relative: 0 %
Eosinophils Absolute: 0 10*3/uL (ref 0.0–0.5)
Eosinophils Relative: 0 %
HCT: 32.7 % — ABNORMAL LOW (ref 36.0–46.0)
Hemoglobin: 10.3 g/dL — ABNORMAL LOW (ref 12.0–15.0)
Immature Granulocytes: 1 %
Lymphocytes Relative: 20 %
Lymphs Abs: 0.8 10*3/uL (ref 0.7–4.0)
MCH: 24.5 pg — ABNORMAL LOW (ref 26.0–34.0)
MCHC: 31.5 g/dL (ref 30.0–36.0)
MCV: 77.9 fL — ABNORMAL LOW (ref 80.0–100.0)
Monocytes Absolute: 0.6 10*3/uL (ref 0.1–1.0)
Monocytes Relative: 15 %
Neutro Abs: 2.3 10*3/uL (ref 1.7–7.7)
Neutrophils Relative %: 64 %
Platelet Count: 206 10*3/uL (ref 150–400)
RBC: 4.2 MIL/uL (ref 3.87–5.11)
RDW: 16.8 % — ABNORMAL HIGH (ref 11.5–15.5)
WBC Count: 3.7 10*3/uL — ABNORMAL LOW (ref 4.0–10.5)
nRBC: 0 % (ref 0.0–0.2)

## 2023-05-19 LAB — CMP (CANCER CENTER ONLY)
ALT: 11 U/L (ref 0–44)
AST: 11 U/L — ABNORMAL LOW (ref 15–41)
Albumin: 3.5 g/dL (ref 3.5–5.0)
Alkaline Phosphatase: 76 U/L (ref 38–126)
Anion gap: 6 (ref 5–15)
BUN: 9 mg/dL (ref 8–23)
CO2: 29 mmol/L (ref 22–32)
Calcium: 8.8 mg/dL — ABNORMAL LOW (ref 8.9–10.3)
Chloride: 102 mmol/L (ref 98–111)
Creatinine: 0.73 mg/dL (ref 0.44–1.00)
GFR, Estimated: >60 mL/min (ref >60)
Glucose, Bld: 272 mg/dL — ABNORMAL HIGH (ref 70–99)
Potassium: 4 mmol/L (ref 3.5–5.1)
Sodium: 137 mmol/L (ref 135–145)
Total Bilirubin: 0.8 mg/dL (ref 0.0–1.2)
Total Protein: 6.9 g/dL (ref 6.5–8.1)

## 2023-05-19 LAB — RAD ONC ARIA SESSION SUMMARY
Course Elapsed Days: 15
Plan Fractions Treated to Date: 12
Plan Prescribed Dose Per Fraction: 2 Gy
Plan Total Fractions Prescribed: 30
Plan Total Prescribed Dose: 60 Gy
Reference Point Dosage Given to Date: 24 Gy
Reference Point Session Dosage Given: 2 Gy
Session Number: 12

## 2023-05-19 MED ORDER — SODIUM CHLORIDE 0.9 % IV SOLN
170.6000 mg | Freq: Once | INTRAVENOUS | Status: AC
  Administered 2023-05-19: 170 mg via INTRAVENOUS
  Filled 2023-05-19: qty 17

## 2023-05-19 MED ORDER — SODIUM CHLORIDE 0.9% FLUSH
10.0000 mL | INTRAVENOUS | Status: DC | PRN
Start: 2023-05-19 — End: 2023-05-20
  Administered 2023-05-19 (×2): 10 mL

## 2023-05-19 MED ORDER — DIPHENHYDRAMINE HCL 50 MG/ML IJ SOLN
25.0000 mg | Freq: Once | INTRAMUSCULAR | Status: AC
  Administered 2023-05-19: 25 mg via INTRAVENOUS
  Filled 2023-05-19: qty 1

## 2023-05-19 MED ORDER — FAMOTIDINE IN NACL 20-0.9 MG/50ML-% IV SOLN
20.0000 mg | Freq: Once | INTRAVENOUS | Status: AC
  Administered 2023-05-19: 20 mg via INTRAVENOUS
  Filled 2023-05-19: qty 50

## 2023-05-19 MED ORDER — PALONOSETRON HCL INJECTION 0.25 MG/5ML
0.2500 mg | Freq: Once | INTRAVENOUS | Status: AC
  Administered 2023-05-19: 0.25 mg via INTRAVENOUS
  Filled 2023-05-19: qty 5

## 2023-05-19 MED ORDER — HEPARIN SOD (PORK) LOCK FLUSH 100 UNIT/ML IV SOLN
500.0000 [IU] | Freq: Once | INTRAVENOUS | Status: AC | PRN
  Administered 2023-05-19: 500 [IU]

## 2023-05-19 MED ORDER — SODIUM CHLORIDE 0.9 % IV SOLN
45.0000 mg/m2 | Freq: Once | INTRAVENOUS | Status: AC
  Administered 2023-05-19: 84 mg via INTRAVENOUS
  Filled 2023-05-19: qty 14

## 2023-05-19 MED ORDER — DEXAMETHASONE SODIUM PHOSPHATE 10 MG/ML IJ SOLN
10.0000 mg | Freq: Once | INTRAMUSCULAR | Status: AC
  Administered 2023-05-19: 10 mg via INTRAVENOUS
  Filled 2023-05-19: qty 1

## 2023-05-19 MED ORDER — SODIUM CHLORIDE 0.9 % IV SOLN: INTRAVENOUS | Status: DC

## 2023-05-19 NOTE — Telephone Encounter (Signed)
 Copied from CRM [id number]. Topic: Clinical - Medication Question >> May 19, 2023 12:19 PM Kathryne Eriksson wrote: Reason for CRM: Status Update >> May 19, 2023  3:53 PM Fonda Kinder J wrote: Pt called in again, I'm assuming in reference to this crm. She advised me that had called a few times but this time she is requesting to speak to Warren. I advised her that she should receive a callback >> May 19, 2023 12:20 PM Kathryne Eriksson wrote: Patient called, wanting a status on her medication dosage. Patient wanted to know had it been changed. I notified the patient that their hasn't been an update at the moment.

## 2023-05-19 NOTE — Progress Notes (Signed)
 [facility] [phone]   [phone]  OFFICE PROGRESS NOTE  [name], MD [address] [place] [address]  DIAGNOSIS:   1) Stage IIIa (T1c, N2, M0) non-small cell lung cancer, adenocarcinoma presented with right lower lobe lung nodule in addition to right paratracheal lymphadenopathy diagnosed in February 2025.  Molecular studies by [id number] showed no actionable mutations and negative PD-L1 expression.   2) history of rheumatoid arthritis and currently on treatment with Plaquenil.  PRIOR THERAPY: None  CURRENT THERAPY: A course of concurrent chemoradiation with weekly carboplatin for AUC of 2 and paclitaxel 45 Mg/M2.  First dose May 04, 2023.  Status post 2 cycles.  INTERVAL HISTORY: Wendy [name] 78 y.o. female returns to the clinic today for follow-up visit accompanied by her son-in-law.Discussed the use of AI scribe software for clinical note transcription with the patient, who gave verbal consent to proceed.  History of Present Illness   Wendy [name] is a 78 year old female with stage 3A non-small cell lung cancer adenocarcinoma who presents for ongoing treatment with concurrent chemo radiation. She is accompanied by her son-in-law.  She was diagnosed with stage 3A non-small cell lung cancer adenocarcinoma in February 2025 and is currently undergoing treatment with concurrent chemo radiation, receiving weekly carboplatin. She has completed two cycles and is starting her third cycle today. The treatment has been going well, with no extreme fatigue noted.  She experienced falls last Saturday, which she attributes to being on an incorrect dose of pregabalin for her legs. She was taking 300 mg instead of the intended 100 mg. After contacting her doctor, the dose was adjusted, and she has not had any further falls since stopping the higher dose.  Her breathing is reported as 'pretty good'. She experienced some diarrhea,  which she associates with consuming a lot of applesauce and forgetting to take her omeprazole. She resumed taking omeprazole this morning.  She has experienced weight fluctuations, initially weighing 186 lbs, dropping to 173 lbs, and currently weighing 179 lbs.     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 Zenovia Jordan     ALLERGIES:  is allergic to aspirin and tramadol.  MEDICATIONS:  Current Outpatient Medications  Medication Sig Dispense Refill   ACCU-CHEK GUIDE TEST test strip USE TO TEST ONCE DAILY 100 strip 1   Accu-Chek Softclix Lancets lancets Test once per day and diagnosis code is E 11.9 and dispense for (ACCU-CHEK Soft Touch) 100 each 12   ALPRAZolam (XANAX) 1 MG tablet TAKE 1 TABLET (1 MG TOTAL) BY MOUTH AT BEDTIME AS NEEDED FOR ANXIETY OR SLEEP. 30 tablet 5  amLODipine (NORVASC) 10 MG tablet Take 1 tablet (10 mg total) by mouth daily. 90 tablet 3   atorvastatin (LIPITOR) 10 MG tablet TAKE 1 TABLET BY MOUTH EVERY DAY 90 tablet 3   Blood Glucose Monitoring Suppl (ACCU-CHEK GUIDE) w/Device KIT Use to test sugars daily. 1 kit 0   Blood Glucose Monitoring Suppl DEVI 1 each by Does not apply route in the morning, at noon, and at bedtime. May substitute to any manufacturer covered by patient's insurance. 1 each 0   Blood Pressure Monitor KIT Use to  check blood pressure. 1 kit 0   Cholecalciferol (VITAMIN D3) 20 MCG (800 UNIT) TABS Take 800 Units by mouth daily.     DULoxetine (CYMBALTA) 30 MG capsule Take 1 capsule (30 mg total) by mouth at bedtime.     fluticasone (FLONASE) 50 MCG/ACT nasal spray SPRAY 2 SPRAYS INTO EACH NOSTRIL EVERY DAY 48 mL 3   furosemide (LASIX) 20 MG tablet Take 1 tablet (20 mg total) by mouth daily as needed (fluid retention). 90 tablet 3   Ginkgo Biloba (GNP GINGKO BILOBA EXTRACT PO) Take by mouth.     glipiZIDE (GLUCOTROL) 10 MG tablet Take 1 tablet (10 mg total) by mouth 2 (two) times daily before a meal. 60 tablet 1   Glucose Blood (BLOOD GLUCOSE TEST STRIPS) STRP 1 each by In Vitro route in the morning, at noon, and at bedtime. May substitute to any manufacturer covered by patient's insurance. 100 each 3   HYDROcodone-acetaminophen (NORCO) 10-325 MG tablet Take 1 tablet by mouth every 6 (six) hours as needed for moderate pain (pain score 4-6). 120 tablet 0   hydroxychloroquine (PLAQUENIL) 200 MG tablet TAKE 1 TABLET(200 MG) BY MOUTH DAILY 90 tablet 1   insulin glargine (LANTUS) 100 UNIT/ML Solostar Pen Inject 25 Units into the skin daily. 15 mL 4   Insulin Pen Needle 32G X 4 MM MISC Use 4x a day 300 each 3   Lancet Device MISC 1 each by Does not apply route in the morning, at noon, and at bedtime. May substitute to any manufacturer covered by patient's insurance. 1 each 0   Lancets (ACCU-CHEK SOFT TOUCH) lancets Test once per day and diagnosis code is E 11.9 and dispense for Aviva plus 100 each 3   Lancets Misc. MISC 1 each by Does not apply route in the morning, at noon, and at bedtime. May substitute to any manufacturer covered by patient's insurance. 100 each 3   levocetirizine (XYZAL) 5 MG tablet TAKE 1 TABLET BY MOUTH EVERY DAY IN THE EVENING 90 tablet 3   levocetirizine (XYZAL) 5 MG tablet Take 1 tablet (5 mg total) by mouth every evening. 90 tablet 3   lidocaine-prilocaine (EMLA) cream Apply to affected  area once 30 g 3   metFORMIN (GLUCOPHAGE) 500 MG tablet Take 1 tablet (500 mg total) by mouth 2 (two) times daily with a meal. 180 tablet 3   mupirocin ointment (BACTROBAN) 2 % Apply 1 Application topically 2 (two) times daily. 22 g 5   omeprazole (PRILOSEC) 40 MG capsule TAKE 1 CAPSULE BY MOUTH EVERY DAY 90 capsule 3   ondansetron (ZOFRAN) 4 MG tablet Take 1 tablet (4 mg total) by mouth every 8 (eight) hours as needed for nausea or vomiting. 90 tablet 3   ondansetron (ZOFRAN) 8 MG tablet Take 1 tablet (8 mg total) by mouth every 8 (eight) hours as needed for nausea or vomiting. Start on the third day after chemotherapy. 30 tablet 1  polyethylene glycol (MIRALAX / GLYCOLAX) 17 g packet Take 17 g by mouth daily as needed for mild constipation.     potassium chloride (KLOR-CON) 10 MEQ tablet Take 1 tablet (10 mEq total) by mouth daily. 90 tablet 3   pregabalin (LYRICA) 300 MG capsule TAKE 1 CAPSULE BY MOUTH EVERYDAY AT BEDTIME 90 capsule 1   prochlorperazine (COMPAZINE) 10 MG tablet Take 1 tablet (10 mg total) by mouth every 6 (six) hours as needed for nausea or vomiting. 30 tablet 1   RESTASIS 0.05 % ophthalmic emulsion Place 1 drop into both eyes 2 (two) times daily.     sitaGLIPtin (JANUVIA) 100 MG tablet Take 1 tablet (100 mg total) by mouth at bedtime.     No current facility-administered medications for this visit.   Facility-Administered Medications Ordered in Other Visits  Medication Dose Route Frequency Provider Last Rate Last Admin   sodium chloride flush (NS) 0.9 % injection 10 mL  10 mL Intracatheter PRN Si Gaul, MD   10 mL at 05/19/23 0800    SURGICAL HISTORY:  Past Surgical History:  Procedure Laterality Date   BRONCHIAL BRUSHINGS  04/07/2023   Procedure: BRONCHIAL BRUSHINGS;  Surgeon: Leslye Peer, MD;  Location: [facility] ENDOSCOPY;  Service: Pulmonary;;   BRONCHIAL NEEDLE ASPIRATION BIOPSY  04/07/2023   Procedure: BRONCHIAL NEEDLE ASPIRATION BIOPSIES;  Surgeon: Leslye Peer, MD;  Location: [facility] ENDOSCOPY;  Service: Pulmonary;;   CATARACT EXTRACTION     COLONOSCOPY  11/23/2019   per Dr. Christella Hartigan, adenmatous polyp, repeat in 7 yrs   ESOPHAGOGASTRODUODENOSCOPY  06/20/2008   at [facility], clear    FINE NEEDLE ASPIRATION  04/07/2023   Procedure: FINE NEEDLE ASPIRATION (FNA) LINEAR;  Surgeon: Leslye Peer, MD;  Location: [facility] ENDOSCOPY;  Service: Pulmonary;;   IR IMAGING GUIDED PORT INSERTION  04/30/2023   LUMBAR LAMINECTOMY     1995   ROTATOR CUFF REPAIR Right    SPINAL FUSION N/A 07/29/2022   Procedure: T10-PELVIS INSTRUMENTATION/ T10-L4 POSTERIOR SPINAL FUSION;  Surgeon: London Sheer, MD;  Location: [facility] OR;  Service: Orthopedics;  Laterality: N/A;   UPPER GASTROINTESTINAL ENDOSCOPY  2010   [facility]    VIDEO BRONCHOSCOPY WITH ENDOBRONCHIAL ULTRASOUND N/A 04/07/2023   Procedure: VIDEO BRONCHOSCOPY WITH ENDOBRONCHIAL ULTRASOUND;  Surgeon: Leslye Peer, MD;  Location: [facility] ENDOSCOPY;  Service: Pulmonary;  Laterality: N/A;    REVIEW OF SYSTEMS:  A comprehensive review of systems was negative except for: Constitutional: positive for fatigue Ears, nose, mouth, throat, and face: positive for sore throat Musculoskeletal: positive for arthralgias   PHYSICAL EXAMINATION: General appearance: alert, cooperative, fatigued, and no distress Head: Normocephalic, without obvious abnormality, atraumatic Neck: no adenopathy, no JVD, supple, symmetrical, trachea midline, and thyroid not enlarged, symmetric, no tenderness/mass/nodules Lymph nodes: Cervical, supraclavicular, and axillary nodes normal. Resp: clear to auscultation bilaterally Back: symmetric, no curvature. ROM normal. No CVA tenderness. Cardio: regular rate and rhythm, S1, S2 normal, no murmur, click, rub or gallop GI: soft, non-tender; bowel sounds normal; no masses,  no organomegaly Extremities: extremities normal, atraumatic, no cyanosis or edema  ECOG PERFORMANCE STATUS: 1 - Symptomatic but  completely ambulatory  Blood pressure (!) 162/96, pulse 99, temperature 97.9 F (36.6 C), temperature source Temporal, resp. rate 16, height 5' 3.25" (1.607 m), weight 179 lb 3.2 oz (81.3 kg), SpO2 100%.  LABORATORY DATA: Lab Results  Component Value Date   WBC 4.2 05/11/2023   HGB 10.9 (L) 05/11/2023   HCT 34.4 (L) 05/11/2023  MCV 78.7 (L) 05/11/2023   PLT 217 05/11/2023      Chemistry      Component Value Date/Time   NA 136 05/11/2023 0812   K 4.0 05/11/2023 0812   CL 106 05/11/2023 0812   CO2 25 05/11/2023 0812   BUN 12 05/11/2023 0812   CREATININE 0.78 05/11/2023 0812   CREATININE 1.07 (H) 09/16/2019 1511      Component Value Date/Time   CALCIUM 8.7 (L) 05/11/2023 0812   ALKPHOS 77 05/11/2023 0812   AST 12 (L) 05/11/2023 0812   ALT 12 05/11/2023 0812   BILITOT 0.6 05/11/2023 0812       RADIOGRAPHIC STUDIES: IR IMAGING GUIDED PORT INSERTION Result Date: 04/30/2023 INDICATION: History of lung cancer.  Chemo EXAM: IMPLANTED PORT A CATH PLACEMENT WITH ULTRASOUND AND FLUOROSCOPIC GUIDANCE MEDICATIONS: None ANESTHESIA/SEDATION: Moderate (conscious) sedation was employed during this procedure. A total of Versed 3 mg and Fentanyl 150 mcg was administered intravenously. Moderate Sedation Time: 44 minutes. The patient's level of consciousness and vital signs were monitored continuously by radiology nursing throughout the procedure under my direct supervision. FLUOROSCOPY TIME:  Fluoroscopic dose; 5.9 mGy COMPLICATIONS: None immediate. PROCEDURE: The procedure, risks, benefits, and alternatives were explained to the patient. Questions regarding the procedure were encouraged and answered. The patient understands and consents to the procedure. The neck and chest were prepped with chlorhexidine in a sterile fashion, and a sterile drape was applied covering the operative field. Maximum barrier sterile technique with sterile gowns and gloves were used for the procedure. A timeout was  performed prior to the initiation of the procedure. Local anesthesia was provided with 1% lidocaine with epinephrine. The procedure began of the patient's LEFT neck. After creating a small venotomy incision, a micropuncture kit was utilized to access the internal jugular vein under direct, real-time ultrasound guidance. Ultrasound image documentation was performed. The wire however would not advance into the LEFT SVC. After multiple attempts, the procedure was converted to the patient's RIGHT side At the RIGHT neck, after creating a small venotomy incision, a micropuncture kit was utilized to access the internal jugular vein under direct, real-time ultrasound guidance. Ultrasound image documentation was performed. The microwire was kinked to measure appropriate catheter length. A subcutaneous port pocket was then created along the upper chest wall utilizing a combination of sharp and blunt dissection. The pocket was irrigated with sterile saline. A single lumen power injectable port was chosen for placement. The 8 Fr catheter was tunneled from the port pocket site to the venotomy incision. The port was placed in the pocket. The external catheter was trimmed to appropriate length. At the venotomy, an 8 Fr peel-away sheath was placed over a guidewire under fluoroscopic guidance. The catheter was then placed through the sheath and the sheath was removed. Final catheter positioning was confirmed and documented with a fluoroscopic spot radiograph. The port was accessed with a Huber needle, aspirated and flushed with heparinized saline. The port pocket incision was closed with interrupted 3-0 Vicryl suture then Dermabond was applied, including at the venotomy incision. Dressings were placed. The patient tolerated the procedure well without immediate post procedural complication. IMPRESSION: Successful placement of a RIGHT internal jugular approach power injectable Port-A-Cath. The tip of the catheter is positioned at the  superior cavo-atrial junction. The catheter is ready for immediate use. Roanna Banning, MD [facility] Electronically Signed   By: Roanna Banning M.D.   On: 04/30/2023 17:32   MR Brain W Wo  Contrast Result Date: 04/24/2023 CLINICAL DATA:  Provided history: Malignant neoplasm of bronchus of right lower lobe. Non-small cell lung cancer, staging. EXAM: MRI HEAD WITHOUT AND WITH CONTRAST TECHNIQUE: Multiplanar, multiecho pulse sequences of the brain and surrounding structures were obtained without and with intravenous contrast. CONTRAST:  7mL GADAVIST GADOBUTROL 1 MMOL/ML IV SOLN COMPARISON:  Non-contrast head CT 01/10/2023. FINDINGS: Brain: No age-advanced or lobar predominant cerebral atrophy. Multifocal T2 FLAIR hyperintense signal abnormality within the cerebral white matter, nonspecific but compatible with mild-to-moderate chronic small vessel ischemic disease. Nonspecific chronic microhemorrhage within the left occipital lobe (series 10, image 26). There is no acute infarct. No evidence of an intracranial mass. No extra-axial fluid collection. No midline shift. No pathologic intracranial enhancement identified. Vascular: Maintained flow voids within the proximal large arterial vessels. Small developmental venous anomaly within the left parietal lobe (anatomic variant). Skull and upper cervical spine: No focal worrisome round lesion. Sinuses/Orbits: No mass or acute finding within the imaged orbits. Prior bilateral ocular lens replacement. No significant paranasal sinus disease. IMPRESSION: 1. No evidence of intracranial metastatic disease. 2. Mild-to-moderate chronic small vessel ischemic changes within the cerebral white matter. Electronically Signed   By: Jackey Loge D.O.   On: 04/24/2023 18:11    ASSESSMENT AND PLAN: This is a very pleasant 78 years old African-American female with Stage IIIa (T1c, N2, M0) non-small cell lung cancer, adenocarcinoma  presented with right lower lobe lung nodule in addition to right paratracheal lymphadenopathy diagnosed in February 2025. Molecular studies by [id number] showed no actionable mutations and negative PD-L1 expression. She also has a history of rheumatoid arthritis and currently on treatment with Plaquenil. She is currently undergoing a course of concurrent chemoradiation with weekly carboplatin for AUC of 2 and paclitaxel 45 Mg/M2.  First dose May 04, 2023.  States post 2 cycles The patient has been tolerating this treatment well with no concerning adverse effect except for mild sore throat and fatigue. I recommended for her to proceed with cycle #3 today as planned.    Stage IIIA non-small cell lung cancer (adenocarcinoma) Diagnosed in February 2025. Currently undergoing concurrent chemoradiation therapy with weekly carboplatin. She has completed two cycles and is starting the third cycle today. Reports minimal fatigue, stable breathing, and no significant nausea or vomiting. Mild diarrhea attributed to dietary choices and omission of omeprazole, which has been resumed. Weight increased from 173 lbs to 179 lbs, indicating stabilization. Labs support continuation of treatment. - Proceed with third cycle of concurrent chemoradiation therapy with weekly carboplatin. - Monitor for side effects and adjust treatment as necessary. - Continue omeprazole to manage gastrointestinal symptoms. - Schedule follow-up in two weeks.  Post-surgical back pain Persistent soreness in the back attributed to previous back surgery and exacerbated by radiation therapy. Pain is mild and localized. - Provide supportive care as needed.  Pregabalin overdose Experienced falls due to an overdose of pregabalin, mistakenly continued at 300 mg instead of 100 mg. Dose corrected, and no further falls have occurred. - Continue pregabalin at the corrected dose of 100 mg. - Monitor for any further falls or side effects.   The  patient was advised to call immediately if she has any concerning symptoms in the interval.  The patient voices understanding of current disease status and treatment options and is in agreement with the current care plan.  All questions were answered. The patient knows to call the clinic with any problems, questions or concerns. We can certainly see the patient much sooner if necessary.  The total time spent in the appointment was 20 minutes.  Disclaimer: This note was dictated with voice recognition software. Similar sounding words can inadvertently be transcribed and may not be corrected upon review.

## 2023-05-20 ENCOUNTER — Ambulatory Visit
Admission: RE | Admit: 2023-05-20 | Discharge: 2023-05-20 | Disposition: A | Discharge status: Home / Self Care | Payer: Medicare HMO | Source: Ambulatory Visit | Attending: Radiation Oncology | Admitting: Radiation Oncology

## 2023-05-20 ENCOUNTER — Other Ambulatory Visit: Payer: Self-pay

## 2023-05-20 ENCOUNTER — Encounter: Payer: Self-pay | Admitting: Internal Medicine

## 2023-05-20 DIAGNOSIS — C3431 Malignant neoplasm of lower lobe, right bronchus or lung: Secondary | ICD-10-CM | POA: Diagnosis not present

## 2023-05-20 DIAGNOSIS — F17210 Nicotine dependence, cigarettes, uncomplicated: Secondary | ICD-10-CM | POA: Diagnosis not present

## 2023-05-20 DIAGNOSIS — Z510 Encounter for antineoplastic radiation therapy: Secondary | ICD-10-CM | POA: Diagnosis not present

## 2023-05-20 LAB — RAD ONC ARIA SESSION SUMMARY
Course Elapsed Days: 16
Plan Fractions Treated to Date: 13
Plan Prescribed Dose Per Fraction: 2 Gy
Plan Total Fractions Prescribed: 30
Plan Total Prescribed Dose: 60 Gy
Reference Point Dosage Given to Date: 26 Gy
Reference Point Session Dosage Given: 2 Gy
Session Number: 13

## 2023-05-21 ENCOUNTER — Other Ambulatory Visit: Payer: Self-pay

## 2023-05-21 ENCOUNTER — Ambulatory Visit
Admission: RE | Admit: 2023-05-21 | Discharge: 2023-05-21 | Disposition: A | Discharge status: Home / Self Care | Payer: Medicare HMO | Source: Ambulatory Visit | Attending: Radiation Oncology | Admitting: Radiation Oncology

## 2023-05-21 DIAGNOSIS — C3431 Malignant neoplasm of lower lobe, right bronchus or lung: Secondary | ICD-10-CM | POA: Diagnosis not present

## 2023-05-21 DIAGNOSIS — F17210 Nicotine dependence, cigarettes, uncomplicated: Secondary | ICD-10-CM | POA: Diagnosis not present

## 2023-05-21 DIAGNOSIS — Z510 Encounter for antineoplastic radiation therapy: Secondary | ICD-10-CM | POA: Diagnosis not present

## 2023-05-21 LAB — RAD ONC ARIA SESSION SUMMARY
Course Elapsed Days: 17
Plan Fractions Treated to Date: 14
Plan Prescribed Dose Per Fraction: 2 Gy
Plan Total Fractions Prescribed: 30
Plan Total Prescribed Dose: 60 Gy
Reference Point Dosage Given to Date: 28 Gy
Reference Point Session Dosage Given: 2 Gy
Session Number: 14

## 2023-05-21 MED ORDER — SITAGLIPTIN PHOSPHATE 100 MG PO TABS: 100.0000 mg | ORAL_TABLET | Freq: Every evening | ORAL | 0 refills | Status: DC

## 2023-05-21 MED ORDER — HYDROXYCHLOROQUINE SULFATE 200 MG PO TABS: 200.0000 mg | ORAL_TABLET | Freq: Every day | ORAL | 1 refills | Status: DC

## 2023-05-21 NOTE — Telephone Encounter (Signed)
 Spoke with pt states that she wants Dr [name] to lower her pregabalin to 100 mg and not 300 mg, pt states that the 300 mg is too strong and its causing frequent falls. Pt requests to sent the new dose to [facility]

## 2023-05-22 ENCOUNTER — Other Ambulatory Visit: Payer: Self-pay | Admitting: Radiation Oncology

## 2023-05-22 ENCOUNTER — Ambulatory Visit
Admission: RE | Admit: 2023-05-22 | Discharge: 2023-05-22 | Disposition: A | Discharge status: Home / Self Care | Source: Ambulatory Visit | Attending: Radiation Oncology | Admitting: Radiation Oncology

## 2023-05-22 ENCOUNTER — Ambulatory Visit
Admission: RE | Admit: 2023-05-22 | Discharge: 2023-05-22 | Disposition: A | Discharge status: Home / Self Care | Payer: Medicare HMO | Source: Ambulatory Visit | Attending: Radiation Oncology | Admitting: Radiation Oncology

## 2023-05-22 ENCOUNTER — Other Ambulatory Visit: Payer: Self-pay

## 2023-05-22 DIAGNOSIS — Z510 Encounter for antineoplastic radiation therapy: Secondary | ICD-10-CM | POA: Diagnosis not present

## 2023-05-22 DIAGNOSIS — C3431 Malignant neoplasm of lower lobe, right bronchus or lung: Secondary | ICD-10-CM | POA: Diagnosis not present

## 2023-05-22 DIAGNOSIS — F17210 Nicotine dependence, cigarettes, uncomplicated: Secondary | ICD-10-CM | POA: Diagnosis not present

## 2023-05-22 LAB — RAD ONC ARIA SESSION SUMMARY
Course Elapsed Days: 18
Plan Fractions Treated to Date: 15
Plan Prescribed Dose Per Fraction: 2 Gy
Plan Total Fractions Prescribed: 30
Plan Total Prescribed Dose: 60 Gy
Reference Point Dosage Given to Date: 30 Gy
Reference Point Session Dosage Given: 2 Gy
Session Number: 15

## 2023-05-22 MED ORDER — SUCRALFATE 1 G PO TABS: 1.0000 g | ORAL_TABLET | Freq: Four times a day (QID) | ORAL | 2 refills | Status: AC

## 2023-05-25 ENCOUNTER — Other Ambulatory Visit: Payer: Self-pay

## 2023-05-25 ENCOUNTER — Inpatient Hospital Stay: Payer: Medicare HMO

## 2023-05-25 ENCOUNTER — Ambulatory Visit
Admission: RE | Admit: 2023-05-25 | Discharge: 2023-05-25 | Disposition: A | Discharge status: Home / Self Care | Payer: Medicare HMO | Source: Ambulatory Visit | Attending: Radiation Oncology | Admitting: Radiation Oncology

## 2023-05-25 VITALS — BP 146/86 | HR 90 | Temp 98.0°F | Resp 17 | Wt 179.4 lb

## 2023-05-25 DIAGNOSIS — C3431 Malignant neoplasm of lower lobe, right bronchus or lung: Secondary | ICD-10-CM

## 2023-05-25 DIAGNOSIS — F17210 Nicotine dependence, cigarettes, uncomplicated: Secondary | ICD-10-CM | POA: Diagnosis not present

## 2023-05-25 DIAGNOSIS — Z510 Encounter for antineoplastic radiation therapy: Secondary | ICD-10-CM | POA: Diagnosis not present

## 2023-05-25 DIAGNOSIS — Z95828 Presence of other vascular implants and grafts: Secondary | ICD-10-CM

## 2023-05-25 LAB — CBC WITH DIFFERENTIAL (CANCER CENTER ONLY)
Abs Immature Granulocytes: 0.02 10*3/uL (ref 0.00–0.07)
Basophils Absolute: 0 10*3/uL (ref 0.0–0.1)
Basophils Relative: 0 %
Eosinophils Absolute: 0 10*3/uL (ref 0.0–0.5)
Eosinophils Relative: 0 %
HCT: 32.4 % — ABNORMAL LOW (ref 36.0–46.0)
Hemoglobin: 10.3 g/dL — ABNORMAL LOW (ref 12.0–15.0)
Immature Granulocytes: 1 %
Lymphocytes Relative: 14 %
Lymphs Abs: 0.4 10*3/uL — ABNORMAL LOW (ref 0.7–4.0)
MCH: 25.1 pg — ABNORMAL LOW (ref 26.0–34.0)
MCHC: 31.8 g/dL (ref 30.0–36.0)
MCV: 78.8 fL — ABNORMAL LOW (ref 80.0–100.0)
Monocytes Absolute: 0.3 10*3/uL (ref 0.1–1.0)
Monocytes Relative: 11 %
Neutro Abs: 2.3 10*3/uL (ref 1.7–7.7)
Neutrophils Relative %: 74 %
Platelet Count: 191 10*3/uL (ref 150–400)
RBC: 4.11 MIL/uL (ref 3.87–5.11)
RDW: 17.2 % — ABNORMAL HIGH (ref 11.5–15.5)
WBC Count: 3.1 10*3/uL — ABNORMAL LOW (ref 4.0–10.5)
nRBC: 0 % (ref 0.0–0.2)

## 2023-05-25 LAB — RAD ONC ARIA SESSION SUMMARY
Course Elapsed Days: 21
Plan Fractions Treated to Date: 16
Plan Prescribed Dose Per Fraction: 2 Gy
Plan Total Fractions Prescribed: 30
Plan Total Prescribed Dose: 60 Gy
Reference Point Dosage Given to Date: 32 Gy
Reference Point Session Dosage Given: 2 Gy
Session Number: 16

## 2023-05-25 LAB — CMP (CANCER CENTER ONLY)
ALT: 13 U/L (ref 0–44)
AST: 11 U/L — ABNORMAL LOW (ref 15–41)
Albumin: 3.4 g/dL — ABNORMAL LOW (ref 3.5–5.0)
Alkaline Phosphatase: 66 U/L (ref 38–126)
Anion gap: 6 (ref 5–15)
BUN: 13 mg/dL (ref 8–23)
CO2: 25 mmol/L (ref 22–32)
Calcium: 8.2 mg/dL — ABNORMAL LOW (ref 8.9–10.3)
Chloride: 105 mmol/L (ref 98–111)
Creatinine: 0.82 mg/dL (ref 0.44–1.00)
GFR, Estimated: >60 mL/min (ref >60)
Glucose, Bld: 272 mg/dL — ABNORMAL HIGH (ref 70–99)
Potassium: 4.4 mmol/L (ref 3.5–5.1)
Sodium: 136 mmol/L (ref 135–145)
Total Bilirubin: 0.7 mg/dL (ref 0.0–1.2)
Total Protein: 6.8 g/dL (ref 6.5–8.1)

## 2023-05-25 MED ORDER — HEPARIN SOD (PORK) LOCK FLUSH 100 UNIT/ML IV SOLN
500.0000 [IU] | Freq: Once | INTRAVENOUS | Status: AC | PRN
Start: 2023-05-25 — End: 2023-05-25
  Administered 2023-05-25: 500 [IU]

## 2023-05-25 MED ORDER — PALONOSETRON HCL INJECTION 0.25 MG/5ML
0.2500 mg | Freq: Once | INTRAVENOUS | Status: AC
  Administered 2023-05-25: 0.25 mg via INTRAVENOUS
  Filled 2023-05-25: qty 5

## 2023-05-25 MED ORDER — FAMOTIDINE IN NACL 20-0.9 MG/50ML-% IV SOLN
20.0000 mg | Freq: Once | INTRAVENOUS | Status: AC
  Administered 2023-05-25: 20 mg via INTRAVENOUS
  Filled 2023-05-25: qty 50

## 2023-05-25 MED ORDER — SODIUM CHLORIDE 0.9 % IV SOLN: INTRAVENOUS | Status: DC

## 2023-05-25 MED ORDER — DEXAMETHASONE SODIUM PHOSPHATE 10 MG/ML IJ SOLN
10.0000 mg | Freq: Once | INTRAMUSCULAR | Status: AC
Start: 2023-05-25 — End: 2023-05-25
  Administered 2023-05-25: 10 mg via INTRAVENOUS
  Filled 2023-05-25: qty 1

## 2023-05-25 MED ORDER — SODIUM CHLORIDE 0.9 % IV SOLN
45.0000 mg/m2 | Freq: Once | INTRAVENOUS | Status: AC
  Administered 2023-05-25: 84 mg via INTRAVENOUS
  Filled 2023-05-25: qty 14

## 2023-05-25 MED ORDER — CARBOPLATIN CHEMO INJECTION 450 MG/45ML
170.0000 mg | Freq: Once | INTRAVENOUS | Status: AC
  Administered 2023-05-25: 170 mg via INTRAVENOUS
  Filled 2023-05-25: qty 17

## 2023-05-25 MED ORDER — DIPHENHYDRAMINE HCL 50 MG/ML IJ SOLN
25.0000 mg | Freq: Once | INTRAMUSCULAR | Status: AC
  Administered 2023-05-25: 25 mg via INTRAVENOUS
  Filled 2023-05-25: qty 1

## 2023-05-25 MED ORDER — PREGABALIN 100 MG PO CAPS: 100.0000 mg | ORAL_CAPSULE | Freq: Every day | ORAL | 1 refills | Status: DC

## 2023-05-25 MED ORDER — SODIUM CHLORIDE 0.9% FLUSH
10.0000 mL | Freq: Once | INTRAVENOUS | Status: AC
  Administered 2023-05-25: 10 mL

## 2023-05-25 MED ORDER — SODIUM CHLORIDE 0.9% FLUSH
10.0000 mL | INTRAVENOUS | Status: DC | PRN
  Administered 2023-05-25: 10 mL

## 2023-05-25 NOTE — Addendum Note (Signed)
 Addended by: Gershon Crane A on: 05/25/2023 12:40 PM   Modules accepted: Orders

## 2023-05-25 NOTE — Patient Instructions (Signed)
 [facility] - A DEPT OF [facility]  Discharge Instructions: Thank you for choosing [facility] to provide your oncology and hematology care.   If you have a lab appointment with the Cancer Center, please go directly to the Cancer Center and check in at the registration area.   Wear comfortable clothing and clothing appropriate for easy access to any Portacath or PICC line.   We strive to give you quality time with your provider. You may need to reschedule your appointment if you arrive late (15 or more minutes).  Arriving late affects you and other patients whose appointments are after yours.  Also, if you miss three or more appointments without notifying the office, you may be dismissed from the clinic at the provider's discretion.      For prescription refill requests, have your pharmacy contact our office and allow 72 hours for refills to be completed.    Today you received the following chemotherapy and/or immunotherapy agents: Paclitaxel and Carboplatin      To help prevent nausea and vomiting after your treatment, we encourage you to take your nausea medication as directed.  BELOW ARE SYMPTOMS THAT SHOULD BE REPORTED IMMEDIATELY: *FEVER GREATER THAN 100.4 F (38 C) OR HIGHER *CHILLS OR SWEATING *NAUSEA AND VOMITING THAT IS NOT CONTROLLED WITH YOUR NAUSEA MEDICATION *UNUSUAL SHORTNESS OF BREATH *UNUSUAL BRUISING OR BLEEDING *URINARY PROBLEMS (pain or burning when urinating, or frequent urination) *BOWEL PROBLEMS (unusual diarrhea, constipation, pain near the anus) TENDERNESS IN MOUTH AND THROAT WITH OR WITHOUT PRESENCE OF ULCERS (sore throat, sores in mouth, or a toothache) UNUSUAL RASH, SWELLING OR PAIN  UNUSUAL VAGINAL DISCHARGE OR ITCHING   Items with * indicate a potential emergency and should be followed up as soon as possible or go to the Emergency Department if any problems should occur.  Please show the CHEMOTHERAPY ALERT CARD  or IMMUNOTHERAPY ALERT CARD at check-in to the Emergency Department and triage nurse.  Should you have questions after your visit or need to cancel or reschedule your appointment, please contact [facility] - A DEPT [facility]  Dept: [phone]  and follow the prompts.  Office hours are 8:00 a.m. to 4:30 p.m. Monday - Friday. Please note that voicemails left after 4:00 p.m. may not be returned until the following business day.  We are closed weekends and major holidays. You have access to a nurse at all times for urgent questions. Please call the main number to the clinic Dept: [phone] and follow the prompts.   For any non-urgent questions, you may also contact your provider using MyChart. We now offer e-Visits for anyone 54 and older to request care online for non-urgent symptoms. For details visit [email or web address].   Also download the MyChart app! Go to the app store, search "MyChart", open the app, select [place], and log in with your MyChart username and password.

## 2023-05-25 NOTE — Telephone Encounter (Signed)
 I sent in the 100 mg dose

## 2023-05-26 ENCOUNTER — Other Ambulatory Visit: Payer: Self-pay

## 2023-05-26 ENCOUNTER — Ambulatory Visit
Admission: RE | Admit: 2023-05-26 | Discharge: 2023-05-26 | Disposition: A | Discharge status: Home / Self Care | Payer: Medicare HMO | Source: Ambulatory Visit | Attending: Radiation Oncology

## 2023-05-26 DIAGNOSIS — C3431 Malignant neoplasm of lower lobe, right bronchus or lung: Secondary | ICD-10-CM | POA: Diagnosis not present

## 2023-05-26 DIAGNOSIS — F17210 Nicotine dependence, cigarettes, uncomplicated: Secondary | ICD-10-CM | POA: Diagnosis not present

## 2023-05-26 DIAGNOSIS — Z510 Encounter for antineoplastic radiation therapy: Secondary | ICD-10-CM | POA: Diagnosis not present

## 2023-05-26 LAB — RAD ONC ARIA SESSION SUMMARY
Course Elapsed Days: 22
Plan Fractions Treated to Date: 17
Plan Prescribed Dose Per Fraction: 2 Gy
Plan Total Fractions Prescribed: 30
Plan Total Prescribed Dose: 60 Gy
Reference Point Dosage Given to Date: 34 Gy
Reference Point Session Dosage Given: 2 Gy
Session Number: 17

## 2023-05-27 ENCOUNTER — Other Ambulatory Visit: Payer: Self-pay

## 2023-05-27 ENCOUNTER — Other Ambulatory Visit: Payer: Self-pay | Admitting: Family Medicine

## 2023-05-27 ENCOUNTER — Ambulatory Visit
Admission: RE | Admit: 2023-05-27 | Discharge: 2023-05-27 | Disposition: A | Discharge status: Home / Self Care | Payer: Medicare HMO | Source: Ambulatory Visit | Attending: Radiation Oncology

## 2023-05-27 DIAGNOSIS — Z510 Encounter for antineoplastic radiation therapy: Secondary | ICD-10-CM | POA: Diagnosis not present

## 2023-05-27 DIAGNOSIS — C3431 Malignant neoplasm of lower lobe, right bronchus or lung: Secondary | ICD-10-CM | POA: Diagnosis not present

## 2023-05-27 DIAGNOSIS — F17210 Nicotine dependence, cigarettes, uncomplicated: Secondary | ICD-10-CM | POA: Diagnosis not present

## 2023-05-27 LAB — RAD ONC ARIA SESSION SUMMARY
Course Elapsed Days: 23
Plan Fractions Treated to Date: 18
Plan Prescribed Dose Per Fraction: 2 Gy
Plan Total Fractions Prescribed: 30
Plan Total Prescribed Dose: 60 Gy
Reference Point Dosage Given to Date: 36 Gy
Reference Point Session Dosage Given: 2 Gy
Session Number: 18

## 2023-05-28 ENCOUNTER — Ambulatory Visit
Admission: RE | Admit: 2023-05-28 | Discharge: 2023-05-28 | Disposition: A | Discharge status: Home / Self Care | Payer: Medicare HMO | Source: Ambulatory Visit | Attending: Radiation Oncology

## 2023-05-28 ENCOUNTER — Other Ambulatory Visit: Payer: Self-pay

## 2023-05-28 DIAGNOSIS — C3431 Malignant neoplasm of lower lobe, right bronchus or lung: Secondary | ICD-10-CM | POA: Diagnosis not present

## 2023-05-28 DIAGNOSIS — Z510 Encounter for antineoplastic radiation therapy: Secondary | ICD-10-CM | POA: Diagnosis not present

## 2023-05-28 DIAGNOSIS — F17210 Nicotine dependence, cigarettes, uncomplicated: Secondary | ICD-10-CM | POA: Diagnosis not present

## 2023-05-28 LAB — RAD ONC ARIA SESSION SUMMARY
Course Elapsed Days: 24
Plan Fractions Treated to Date: 19
Plan Prescribed Dose Per Fraction: 2 Gy
Plan Total Fractions Prescribed: 30
Plan Total Prescribed Dose: 60 Gy
Reference Point Dosage Given to Date: 38 Gy
Reference Point Session Dosage Given: 2 Gy
Session Number: 19

## 2023-05-29 ENCOUNTER — Ambulatory Visit
Admission: RE | Admit: 2023-05-29 | Discharge: 2023-05-29 | Disposition: A | Discharge status: Home / Self Care | Payer: Medicare HMO | Source: Ambulatory Visit | Attending: Radiation Oncology | Admitting: Radiation Oncology

## 2023-05-29 ENCOUNTER — Ambulatory Visit
Admission: RE | Admit: 2023-05-29 | Discharge: 2023-05-29 | Disposition: A | Discharge status: Home / Self Care | Source: Ambulatory Visit | Attending: Radiation Oncology | Admitting: Radiation Oncology

## 2023-05-29 ENCOUNTER — Other Ambulatory Visit: Payer: Self-pay

## 2023-05-29 DIAGNOSIS — F17210 Nicotine dependence, cigarettes, uncomplicated: Secondary | ICD-10-CM | POA: Diagnosis not present

## 2023-05-29 DIAGNOSIS — Z510 Encounter for antineoplastic radiation therapy: Secondary | ICD-10-CM | POA: Diagnosis not present

## 2023-05-29 DIAGNOSIS — C3431 Malignant neoplasm of lower lobe, right bronchus or lung: Secondary | ICD-10-CM | POA: Diagnosis not present

## 2023-05-29 LAB — RAD ONC ARIA SESSION SUMMARY
Course Elapsed Days: 25
Plan Fractions Treated to Date: 20
Plan Prescribed Dose Per Fraction: 2 Gy
Plan Total Fractions Prescribed: 30
Plan Total Prescribed Dose: 60 Gy
Reference Point Dosage Given to Date: 40 Gy
Reference Point Session Dosage Given: 2 Gy
Session Number: 20

## 2023-06-01 ENCOUNTER — Encounter: Payer: Self-pay | Admitting: Internal Medicine

## 2023-06-01 ENCOUNTER — Inpatient Hospital Stay: Payer: Medicare HMO

## 2023-06-01 ENCOUNTER — Other Ambulatory Visit: Payer: Self-pay

## 2023-06-01 ENCOUNTER — Inpatient Hospital Stay: Payer: Medicare HMO | Admitting: Internal Medicine

## 2023-06-01 ENCOUNTER — Ambulatory Visit
Admission: RE | Admit: 2023-06-01 | Discharge: 2023-06-01 | Disposition: A | Discharge status: Home / Self Care | Payer: Medicare HMO | Source: Ambulatory Visit | Attending: Radiation Oncology | Admitting: Radiation Oncology

## 2023-06-01 VITALS — BP 152/82 | HR 95 | Temp 98.0°F | Resp 16 | Ht 63.5 in | Wt 178.0 lb

## 2023-06-01 DIAGNOSIS — C3431 Malignant neoplasm of lower lobe, right bronchus or lung: Secondary | ICD-10-CM

## 2023-06-01 DIAGNOSIS — F17210 Nicotine dependence, cigarettes, uncomplicated: Secondary | ICD-10-CM | POA: Diagnosis not present

## 2023-06-01 DIAGNOSIS — Z95828 Presence of other vascular implants and grafts: Secondary | ICD-10-CM

## 2023-06-01 DIAGNOSIS — Z510 Encounter for antineoplastic radiation therapy: Secondary | ICD-10-CM | POA: Diagnosis not present

## 2023-06-01 LAB — CBC WITH DIFFERENTIAL (CANCER CENTER ONLY)
Abs Immature Granulocytes: 0.05 10*3/uL (ref 0.00–0.07)
Basophils Absolute: 0 10*3/uL (ref 0.0–0.1)
Basophils Relative: 1 %
Eosinophils Absolute: 0 10*3/uL (ref 0.0–0.5)
Eosinophils Relative: 0 %
HCT: 31.2 % — ABNORMAL LOW (ref 36.0–46.0)
Hemoglobin: 9.9 g/dL — ABNORMAL LOW (ref 12.0–15.0)
Immature Granulocytes: 2 %
Lymphocytes Relative: 14 %
Lymphs Abs: 0.4 10*3/uL — ABNORMAL LOW (ref 0.7–4.0)
MCH: 24.6 pg — ABNORMAL LOW (ref 26.0–34.0)
MCHC: 31.7 g/dL (ref 30.0–36.0)
MCV: 77.6 fL — ABNORMAL LOW (ref 80.0–100.0)
Monocytes Absolute: 0.4 10*3/uL (ref 0.1–1.0)
Monocytes Relative: 15 %
Neutro Abs: 2 10*3/uL (ref 1.7–7.7)
Neutrophils Relative %: 68 %
Platelet Count: 148 10*3/uL — ABNORMAL LOW (ref 150–400)
RBC: 4.02 MIL/uL (ref 3.87–5.11)
RDW: 17.8 % — ABNORMAL HIGH (ref 11.5–15.5)
WBC Count: 2.9 10*3/uL — ABNORMAL LOW (ref 4.0–10.5)
nRBC: 0 % (ref 0.0–0.2)

## 2023-06-01 LAB — RAD ONC ARIA SESSION SUMMARY
Course Elapsed Days: 28
Plan Fractions Treated to Date: 21
Plan Prescribed Dose Per Fraction: 2 Gy
Plan Total Fractions Prescribed: 30
Plan Total Prescribed Dose: 60 Gy
Reference Point Dosage Given to Date: 42 Gy
Reference Point Session Dosage Given: 2 Gy
Session Number: 21

## 2023-06-01 LAB — CMP (CANCER CENTER ONLY)
ALT: 11 U/L (ref 0–44)
AST: 12 U/L — ABNORMAL LOW (ref 15–41)
Albumin: 3.5 g/dL (ref 3.5–5.0)
Alkaline Phosphatase: 67 U/L (ref 38–126)
Anion gap: 7 (ref 5–15)
BUN: 11 mg/dL (ref 8–23)
CO2: 28 mmol/L (ref 22–32)
Calcium: 8.4 mg/dL — ABNORMAL LOW (ref 8.9–10.3)
Chloride: 103 mmol/L (ref 98–111)
Creatinine: 0.78 mg/dL (ref 0.44–1.00)
GFR, Estimated: >60 mL/min (ref >60)
Glucose, Bld: 225 mg/dL — ABNORMAL HIGH (ref 70–99)
Potassium: 3.9 mmol/L (ref 3.5–5.1)
Sodium: 138 mmol/L (ref 135–145)
Total Bilirubin: 0.6 mg/dL (ref 0.0–1.2)
Total Protein: 6.8 g/dL (ref 6.5–8.1)

## 2023-06-01 MED ORDER — PALONOSETRON HCL INJECTION 0.25 MG/5ML
0.2500 mg | Freq: Once | INTRAVENOUS | Status: AC
  Administered 2023-06-01: 0.25 mg via INTRAVENOUS
  Filled 2023-06-01: qty 5

## 2023-06-01 MED ORDER — DIPHENHYDRAMINE HCL 50 MG/ML IJ SOLN
25.0000 mg | Freq: Once | INTRAMUSCULAR | Status: AC
Start: 2023-06-01 — End: 2023-06-01
  Administered 2023-06-01: 25 mg via INTRAVENOUS
  Filled 2023-06-01: qty 1

## 2023-06-01 MED ORDER — PACLITAXEL CHEMO INJECTION 300 MG/50ML
45.0000 mg/m2 | Freq: Once | INTRAVENOUS | Status: AC
  Administered 2023-06-01: 84 mg via INTRAVENOUS
  Filled 2023-06-01: qty 14

## 2023-06-01 MED ORDER — FAMOTIDINE IN NACL 20-0.9 MG/50ML-% IV SOLN
20.0000 mg | Freq: Once | INTRAVENOUS | Status: AC
  Administered 2023-06-01: 20 mg via INTRAVENOUS
  Filled 2023-06-01: qty 50

## 2023-06-01 MED ORDER — DEXAMETHASONE SODIUM PHOSPHATE 10 MG/ML IJ SOLN
10.0000 mg | Freq: Once | INTRAMUSCULAR | Status: AC
  Administered 2023-06-01: 10 mg via INTRAVENOUS
  Filled 2023-06-01: qty 1

## 2023-06-01 MED ORDER — SODIUM CHLORIDE 0.9% FLUSH
10.0000 mL | Freq: Once | INTRAVENOUS | Status: AC
  Administered 2023-06-01: 10 mL

## 2023-06-01 MED ORDER — CARBOPLATIN CHEMO INJECTION 450 MG/45ML
170.0000 mg | Freq: Once | INTRAVENOUS | Status: AC
  Administered 2023-06-01: 170 mg via INTRAVENOUS
  Filled 2023-06-01: qty 17

## 2023-06-01 MED ORDER — SODIUM CHLORIDE 0.9 % IV SOLN: INTRAVENOUS | Status: DC

## 2023-06-01 NOTE — Patient Instructions (Signed)
 [facility] - A DEPT OF [facility]  Discharge Instructions: Thank you for choosing [facility] to provide your oncology and hematology care.   If you have a lab appointment with the Cancer Center, please go directly to the Cancer Center and check in at the registration area.   Wear comfortable clothing and clothing appropriate for easy access to any Portacath or PICC line.   We strive to give you quality time with your provider. You may need to reschedule your appointment if you arrive late (15 or more minutes).  Arriving late affects you and other patients whose appointments are after yours.  Also, if you miss three or more appointments without notifying the office, you may be dismissed from the clinic at the provider's discretion.      For prescription refill requests, have your pharmacy contact our office and allow 72 hours for refills to be completed.    Today you received the following chemotherapy and/or immunotherapy agents: Paclitaxel and Carboplatin      To help prevent nausea and vomiting after your treatment, we encourage you to take your nausea medication as directed.  BELOW ARE SYMPTOMS THAT SHOULD BE REPORTED IMMEDIATELY: *FEVER GREATER THAN 100.4 F (38 C) OR HIGHER *CHILLS OR SWEATING *NAUSEA AND VOMITING THAT IS NOT CONTROLLED WITH YOUR NAUSEA MEDICATION *UNUSUAL SHORTNESS OF BREATH *UNUSUAL BRUISING OR BLEEDING *URINARY PROBLEMS (pain or burning when urinating, or frequent urination) *BOWEL PROBLEMS (unusual diarrhea, constipation, pain near the anus) TENDERNESS IN MOUTH AND THROAT WITH OR WITHOUT PRESENCE OF ULCERS (sore throat, sores in mouth, or a toothache) UNUSUAL RASH, SWELLING OR PAIN  UNUSUAL VAGINAL DISCHARGE OR ITCHING   Items with * indicate a potential emergency and should be followed up as soon as possible or go to the Emergency Department if any problems should occur.  Please show the CHEMOTHERAPY ALERT CARD  or IMMUNOTHERAPY ALERT CARD at check-in to the Emergency Department and triage nurse.  Should you have questions after your visit or need to cancel or reschedule your appointment, please contact [facility] - A DEPT [facility]  Dept: [phone]  and follow the prompts.  Office hours are 8:00 a.m. to 4:30 p.m. Monday - Friday. Please note that voicemails left after 4:00 p.m. may not be returned until the following business day.  We are closed weekends and major holidays. You have access to a nurse at all times for urgent questions. Please call the main number to the clinic Dept: [phone] and follow the prompts.   For any non-urgent questions, you may also contact your provider using MyChart. We now offer e-Visits for anyone 54 and older to request care online for non-urgent symptoms. For details visit [email or web address].   Also download the MyChart app! Go to the app store, search "MyChart", open the app, select [place], and log in with your MyChart username and password.

## 2023-06-01 NOTE — Progress Notes (Signed)
 [facility] [phone]   [phone]  OFFICE PROGRESS NOTE  [name], MD [address] [place] [address]  DIAGNOSIS:   1) Stage IIIa (T1c, N2, M0) non-small cell lung cancer, adenocarcinoma presented with right lower lobe lung nodule in addition to right paratracheal lymphadenopathy diagnosed in February 2025.  Molecular studies by [id number] showed no actionable mutations and negative PD-L1 expression.   2) history of rheumatoid arthritis and currently on treatment with Plaquenil.  PRIOR THERAPY: None  CURRENT THERAPY: A course of concurrent chemoradiation with weekly carboplatin for AUC of 2 and paclitaxel 45 Mg/M2.  First dose May 04, 2023.  Status post 4 cycles.  INTERVAL HISTORY: Wendy [name] 78 y.o. female returns to the clinic today for follow-up visit accompanied by her daughter.  Discussed the use of AI scribe software for clinical note transcription with the patient, who gave verbal consent to proceed.  History of Present Illness   Wendy [name] is a 78 year old female with stage IIIA non-small cell lung cancer who presents for evaluation before starting cycle number five of chemoradiation.  She was diagnosed with stage IIIA non-small cell lung cancer, adenocarcinoma, in February 2025 and is currently undergoing concurrent chemoradiation with weekly carboplatin and paclitaxel. She has completed four cycles and is here for evaluation before starting the fifth cycle.  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 She was prescribed Carafate to dissolve in water and take before meals, but it caused nausea and vomiting, leading her to mash her food to swallow better.  No nausea, vomiting, diarrhea, or cough. Her blood count is on the lower side but still acceptable for  treatment.      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 Zenovia Jordan     ALLERGIES:  is allergic to aspirin and tramadol.  MEDICATIONS:  Current Outpatient Medications  Medication Sig Dispense Refill   ACCU-CHEK GUIDE TEST test strip USE TO TEST ONCE DAILY 100 strip 1   Accu-Chek Softclix Lancets lancets Test once per day and diagnosis code is E 11.9 and dispense for (ACCU-CHEK Soft Touch) 100 each 12   ALPRAZolam (XANAX) 1 MG tablet TAKE 1 TABLET (1 MG TOTAL) BY MOUTH AT BEDTIME AS NEEDED FOR ANXIETY OR SLEEP. 30 tablet 5   amLODipine (NORVASC) 10 MG tablet Take 1 tablet (10 mg total) by mouth daily. 90 tablet 3   atorvastatin (LIPITOR) 10 MG tablet TAKE 1 TABLET BY MOUTH EVERY DAY 90 tablet 3   Blood Glucose Monitoring Suppl (ACCU-CHEK  GUIDE) w/Device KIT Use to test sugars daily. 1 kit 0   Blood Glucose Monitoring Suppl DEVI 1 each by Does not apply route in the morning, at noon, and at bedtime. May substitute to any manufacturer covered by patient's insurance. 1 each 0   Blood Pressure Monitor KIT Use to check blood pressure. 1 kit 0   Cholecalciferol (VITAMIN D3) 20 MCG (800 UNIT) TABS Take 800 Units by mouth daily.     DULoxetine (CYMBALTA) 30 MG capsule Take 1 capsule (30 mg total) by mouth at bedtime.     fluticasone (FLONASE) 50 MCG/ACT nasal spray SPRAY 2  SPRAYS INTO EACH NOSTRIL EVERY DAY 48 mL 3   furosemide (LASIX) 20 MG tablet Take 1 tablet (20 mg total) by mouth daily as needed (fluid retention). 90 tablet 3   Ginkgo Biloba (GNP GINGKO BILOBA EXTRACT PO) Take by mouth.     glipiZIDE (GLUCOTROL) 10 MG tablet TAKE 1 TABLET (10 MG TOTAL) BY MOUTH TWICE A DAY BEFORE A MEAL 180 tablet 0   Glucose Blood (BLOOD GLUCOSE TEST STRIPS) STRP 1 each by In Vitro route in the morning, at noon, and at bedtime. May substitute to any manufacturer covered by patient's insurance. 100 each 3   HYDROcodone-acetaminophen (NORCO) 10-325 MG tablet Take 1 tablet by mouth every 6 (six) hours as needed for moderate pain (pain score 4-6). 120 tablet 0   hydroxychloroquine (PLAQUENIL) 200 MG tablet Take 1 tablet (200 mg total) by mouth daily. 90 tablet 1   insulin glargine (LANTUS) 100 UNIT/ML Solostar Pen Inject 25 Units into the skin daily. 15 mL 4   Insulin Pen Needle 32G X 4 MM MISC Use 4x a day 300 each 3   Lancets (ACCU-CHEK SOFT TOUCH) lancets Test once per day and diagnosis code is E 11.9 and dispense for Aviva plus 100 each 3   levocetirizine (XYZAL) 5 MG tablet TAKE 1 TABLET BY MOUTH EVERY DAY IN THE EVENING 90 tablet 3   levocetirizine (XYZAL) 5 MG tablet Take 1 tablet (5 mg total) by mouth every evening. 90 tablet 3   lidocaine-prilocaine (EMLA) cream Apply to affected area once 30 g 3   metFORMIN (GLUCOPHAGE) 500 MG tablet Take 1 tablet (500 mg total) by mouth 2 (two) times daily with a meal. 180 tablet 3   mupirocin ointment (BACTROBAN) 2 % Apply 1 Application topically 2 (two) times daily. 22 g 5   omeprazole (PRILOSEC) 40 MG capsule TAKE 1 CAPSULE BY MOUTH EVERY DAY 90 capsule 3   ondansetron (ZOFRAN) 4 MG tablet Take 1 tablet (4 mg total) by mouth every 8 (eight) hours as needed for nausea or vomiting. 90 tablet 3   ondansetron (ZOFRAN) 8 MG tablet Take 1 tablet (8 mg total) by mouth every 8 (eight) hours as needed for nausea or vomiting. Start on the third  day after chemotherapy. 30 tablet 1   polyethylene glycol (MIRALAX / GLYCOLAX) 17 g packet Take 17 g by mouth daily as needed for mild constipation.     potassium chloride (KLOR-CON) 10 MEQ tablet Take 1 tablet (10 mEq total) by mouth daily. 90 tablet 3   pregabalin (LYRICA) 100 MG capsule Take 1 capsule (100 mg total) by mouth at bedtime. 90 capsule 1   prochlorperazine (COMPAZINE) 10 MG tablet Take 1 tablet (10 mg total) by mouth every 6 (six) hours as needed for nausea or vomiting. 30 tablet 1   RESTASIS 0.05 % ophthalmic emulsion Place 1 drop into both eyes  2 (two) times daily.     sitaGLIPtin (JANUVIA) 100 MG tablet Take 1 tablet (100 mg total) by mouth at bedtime. 90 tablet 0   sucralfate (CARAFATE) 1 g tablet Take 1 tablet (1 g total) by mouth 4 (four) times daily. Dissolve each tablet in 15 cc water before use. 120 tablet 2   No current facility-administered medications for this visit.    SURGICAL HISTORY:  Past Surgical History:  Procedure Laterality Date   BRONCHIAL BRUSHINGS  04/07/2023   Procedure: BRONCHIAL BRUSHINGS;  Surgeon: Leslye Peer, MD;  Location: [facility] ENDOSCOPY;  Service: Pulmonary;;   BRONCHIAL NEEDLE ASPIRATION BIOPSY  04/07/2023   Procedure: BRONCHIAL NEEDLE ASPIRATION BIOPSIES;  Surgeon: Leslye Peer, MD;  Location: [facility] ENDOSCOPY;  Service: Pulmonary;;   CATARACT EXTRACTION     COLONOSCOPY  11/23/2019   per Dr. Christella Hartigan, adenmatous polyp, repeat in 7 yrs   ESOPHAGOGASTRODUODENOSCOPY  06/20/2008   at [facility], clear    FINE NEEDLE ASPIRATION  04/07/2023   Procedure: FINE NEEDLE ASPIRATION (FNA) LINEAR;  Surgeon: Leslye Peer, MD;  Location: [facility] ENDOSCOPY;  Service: Pulmonary;;   IR IMAGING GUIDED PORT INSERTION  04/30/2023   LUMBAR LAMINECTOMY     1995   ROTATOR CUFF REPAIR Right    SPINAL FUSION N/A 07/29/2022   Procedure: T10-PELVIS INSTRUMENTATION/ T10-L4 POSTERIOR SPINAL FUSION;  Surgeon: London Sheer, MD;  Location: [facility] OR;  Service: Orthopedics;   Laterality: N/A;   UPPER GASTROINTESTINAL ENDOSCOPY  2010   [facility]    VIDEO BRONCHOSCOPY WITH ENDOBRONCHIAL ULTRASOUND N/A 04/07/2023   Procedure: VIDEO BRONCHOSCOPY WITH ENDOBRONCHIAL ULTRASOUND;  Surgeon: Leslye Peer, MD;  Location: [facility] ENDOSCOPY;  Service: Pulmonary;  Laterality: N/A;    REVIEW OF SYSTEMS:  A comprehensive review of systems was negative except for: Constitutional: positive for fatigue Gastrointestinal: positive for odynophagia Musculoskeletal: positive for arthralgias   PHYSICAL EXAMINATION: General appearance: alert, cooperative, fatigued, and no distress Head: Normocephalic, without obvious abnormality, atraumatic Neck: no adenopathy, no JVD, supple, symmetrical, trachea midline, and thyroid not enlarged, symmetric, no tenderness/mass/nodules Lymph nodes: Cervical, supraclavicular, and axillary nodes normal. Resp: clear to auscultation bilaterally Back: symmetric, no curvature. ROM normal. No CVA tenderness. Cardio: regular rate and rhythm, S1, S2 normal, no murmur, click, rub or gallop GI: soft, non-tender; bowel sounds normal; no masses,  no organomegaly Extremities: extremities normal, atraumatic, no cyanosis or edema  ECOG PERFORMANCE STATUS: 1 - Symptomatic but completely ambulatory  Blood pressure (!) 152/82, pulse 95, temperature 98 F (36.7 C), temperature source Temporal, resp. rate 16, height 5' 3.5" (1.613 m), weight 178 lb (80.7 kg), SpO2 98%.  LABORATORY DATA: Lab Results  Component Value Date   WBC 2.9 (L) 06/01/2023   HGB 9.9 (L) 06/01/2023   HCT 31.2 (L) 06/01/2023   MCV 77.6 (L) 06/01/2023   PLT 148 (L) 06/01/2023      Chemistry      Component Value Date/Time   NA 136 05/25/2023 0811   K 4.4 05/25/2023 0811   CL 105 05/25/2023 0811   CO2 25 05/25/2023 0811   BUN 13 05/25/2023 0811   CREATININE 0.82 05/25/2023 0811   CREATININE 1.07 (H) 09/16/2019 1511      Component Value Date/Time   CALCIUM 8.2 (L) 05/25/2023 0811   ALKPHOS 66  05/25/2023 0811   AST 11 (L) 05/25/2023 0811   ALT 13 05/25/2023 0811   BILITOT 0.7 05/25/2023 0811       RADIOGRAPHIC STUDIES: No results found.  ASSESSMENT AND PLAN: This is a very pleasant 78 years old African-American female with Stage IIIa (T1c, N2, M0) non-small cell lung cancer, adenocarcinoma presented with right lower lobe lung nodule in addition to right paratracheal lymphadenopathy diagnosed in February 2025. Molecular studies by [id number] showed no actionable mutations and negative PD-L1 expression. She also has a history of rheumatoid arthritis and currently on treatment with Plaquenil. She is currently undergoing a course of concurrent chemoradiation with weekly carboplatin for AUC of 2 and paclitaxel 45 Mg/M2.  First dose May 04, 2023.  States post 4 cycles She continues to tolerate her treatment fairly well. Assessment and Plan    Stage IIIA non-small cell lung cancer, adenocarcinoma Diagnosed in February 2025, currently undergoing concurrent chemoradiation with weekly carboplatin and paclitaxel. Completed four cycles, preparing for the fifth. Blood counts are low but acceptable. No cough or mucus production. Treatment response to be assessed post-therapy with imaging. Last chemotherapy on April 14, last radiation on April 16. 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 Carafate caused nausea and vomiting, leading to discontinuation. Managing symptoms by mashing food. Radiation oncologist consultation needed to adjust therapy. - Consult radiation oncologist to adjust radiation therapy - Discontinue Carafate - Continue mashing food to ease swallowing   The patient was advised to call immediately if she has any concerning  symptoms in the interval. The patient voices understanding of current disease status and treatment options and is in agreement with the current care plan.  All questions were answered. The patient knows to call the clinic with any problems, questions or concerns. We can certainly see the patient much sooner if necessary.  The total time spent in the appointment was 20 minutes.  Disclaimer: This note was dictated with voice recognition software. Similar sounding words can inadvertently be transcribed and may not be corrected upon review.

## 2023-06-02 ENCOUNTER — Ambulatory Visit
Admission: RE | Admit: 2023-06-02 | Discharge: 2023-06-02 | Disposition: A | Discharge status: Home / Self Care | Payer: Medicare HMO | Source: Ambulatory Visit | Attending: Radiation Oncology | Admitting: Radiation Oncology

## 2023-06-02 ENCOUNTER — Other Ambulatory Visit: Payer: Self-pay

## 2023-06-02 DIAGNOSIS — C3431 Malignant neoplasm of lower lobe, right bronchus or lung: Secondary | ICD-10-CM | POA: Diagnosis not present

## 2023-06-02 DIAGNOSIS — Z510 Encounter for antineoplastic radiation therapy: Secondary | ICD-10-CM | POA: Diagnosis not present

## 2023-06-02 DIAGNOSIS — F17210 Nicotine dependence, cigarettes, uncomplicated: Secondary | ICD-10-CM | POA: Diagnosis not present

## 2023-06-02 LAB — RAD ONC ARIA SESSION SUMMARY
Course Elapsed Days: 29
Plan Fractions Treated to Date: 22
Plan Prescribed Dose Per Fraction: 2 Gy
Plan Total Fractions Prescribed: 30
Plan Total Prescribed Dose: 60 Gy
Reference Point Dosage Given to Date: 44 Gy
Reference Point Session Dosage Given: 2 Gy
Session Number: 22

## 2023-06-02 NOTE — Addendum Note (Signed)
 Encounter addended by: Edward Qualia on: 06/02/2023 1:32 PM  Actions taken: Imaging Exam ended

## 2023-06-03 ENCOUNTER — Ambulatory Visit
Admission: RE | Admit: 2023-06-03 | Discharge: 2023-06-03 | Disposition: A | Discharge status: Home / Self Care | Payer: Medicare HMO | Source: Ambulatory Visit | Attending: Radiation Oncology

## 2023-06-03 ENCOUNTER — Encounter: Payer: Self-pay | Admitting: Endocrinology

## 2023-06-03 ENCOUNTER — Ambulatory Visit: Payer: Medicare HMO | Admitting: Endocrinology

## 2023-06-03 ENCOUNTER — Other Ambulatory Visit: Payer: Self-pay

## 2023-06-03 VITALS — BP 128/82 | HR 111 | Resp 20 | Ht 63.5 in | Wt 178.6 lb

## 2023-06-03 DIAGNOSIS — Z886 Allergy status to analgesic agent status: Secondary | ICD-10-CM | POA: Insufficient documentation

## 2023-06-03 DIAGNOSIS — Z794 Long term (current) use of insulin: Secondary | ICD-10-CM

## 2023-06-03 DIAGNOSIS — Z7984 Long term (current) use of oral hypoglycemic drugs: Secondary | ICD-10-CM | POA: Diagnosis not present

## 2023-06-03 DIAGNOSIS — R232 Flushing: Secondary | ICD-10-CM | POA: Insufficient documentation

## 2023-06-03 DIAGNOSIS — M2550 Pain in unspecified joint: Secondary | ICD-10-CM | POA: Insufficient documentation

## 2023-06-03 DIAGNOSIS — R1310 Dysphagia, unspecified: Secondary | ICD-10-CM | POA: Insufficient documentation

## 2023-06-03 DIAGNOSIS — D72819 Decreased white blood cell count, unspecified: Secondary | ICD-10-CM | POA: Insufficient documentation

## 2023-06-03 DIAGNOSIS — R5383 Other fatigue: Secondary | ICD-10-CM | POA: Insufficient documentation

## 2023-06-03 DIAGNOSIS — I10 Essential (primary) hypertension: Secondary | ICD-10-CM | POA: Insufficient documentation

## 2023-06-03 DIAGNOSIS — Z79899 Other long term (current) drug therapy: Secondary | ICD-10-CM | POA: Insufficient documentation

## 2023-06-03 DIAGNOSIS — Z923 Personal history of irradiation: Secondary | ICD-10-CM | POA: Insufficient documentation

## 2023-06-03 DIAGNOSIS — Z79630 Long term (current) use of alkylating agent: Secondary | ICD-10-CM | POA: Insufficient documentation

## 2023-06-03 DIAGNOSIS — M069 Rheumatoid arthritis, unspecified: Secondary | ICD-10-CM | POA: Insufficient documentation

## 2023-06-03 DIAGNOSIS — E1165 Type 2 diabetes mellitus with hyperglycemia: Secondary | ICD-10-CM | POA: Diagnosis not present

## 2023-06-03 DIAGNOSIS — Z79633 Long term (current) use of mitotic inhibitor: Secondary | ICD-10-CM | POA: Insufficient documentation

## 2023-06-03 DIAGNOSIS — Z510 Encounter for antineoplastic radiation therapy: Secondary | ICD-10-CM | POA: Diagnosis not present

## 2023-06-03 DIAGNOSIS — F17210 Nicotine dependence, cigarettes, uncomplicated: Secondary | ICD-10-CM | POA: Diagnosis not present

## 2023-06-03 DIAGNOSIS — E119 Type 2 diabetes mellitus without complications: Secondary | ICD-10-CM | POA: Insufficient documentation

## 2023-06-03 DIAGNOSIS — N951 Menopausal and female climacteric states: Secondary | ICD-10-CM | POA: Insufficient documentation

## 2023-06-03 DIAGNOSIS — C3431 Malignant neoplasm of lower lobe, right bronchus or lung: Secondary | ICD-10-CM | POA: Insufficient documentation

## 2023-06-03 DIAGNOSIS — Z5111 Encounter for antineoplastic chemotherapy: Secondary | ICD-10-CM | POA: Insufficient documentation

## 2023-06-03 DIAGNOSIS — Z885 Allergy status to narcotic agent status: Secondary | ICD-10-CM | POA: Insufficient documentation

## 2023-06-03 LAB — RAD ONC ARIA SESSION SUMMARY
Course Elapsed Days: 30
Plan Fractions Treated to Date: 23
Plan Prescribed Dose Per Fraction: 2 Gy
Plan Total Fractions Prescribed: 30
Plan Total Prescribed Dose: 60 Gy
Reference Point Dosage Given to Date: 46 Gy
Reference Point Session Dosage Given: 2 Gy
Session Number: 23
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
## 2023-06-03 NOTE — Patient Instructions (Signed)
 Continue Januvia, metformin, glipizide at the same doses.  Take Lantus 15 units daily in the morning.  Long-acting insulin.  Check blood sugar before meals and at bedtime.  Bring glucometer in the follow-up visit.

## 2023-06-04 ENCOUNTER — Ambulatory Visit
Admission: RE | Admit: 2023-06-04 | Discharge: 2023-06-04 | Disposition: A | Discharge status: Home / Self Care | Payer: Medicare HMO | Source: Ambulatory Visit | Attending: Radiation Oncology

## 2023-06-04 ENCOUNTER — Other Ambulatory Visit: Payer: Self-pay

## 2023-06-04 DIAGNOSIS — Z510 Encounter for antineoplastic radiation therapy: Secondary | ICD-10-CM | POA: Diagnosis not present

## 2023-06-04 DIAGNOSIS — F17210 Nicotine dependence, cigarettes, uncomplicated: Secondary | ICD-10-CM | POA: Diagnosis not present

## 2023-06-04 DIAGNOSIS — C3431 Malignant neoplasm of lower lobe, right bronchus or lung: Secondary | ICD-10-CM | POA: Diagnosis not present

## 2023-06-04 LAB — RAD ONC ARIA SESSION SUMMARY
Course Elapsed Days: 31
Plan Fractions Treated to Date: 24
Plan Prescribed Dose Per Fraction: 2 Gy
Plan Total Fractions Prescribed: 30
Plan Total Prescribed Dose: 60 Gy
Reference Point Dosage Given to Date: 48 Gy
Reference Point Session Dosage Given: 2 Gy
Session Number: 24

## 2023-06-05 ENCOUNTER — Ambulatory Visit
Admission: RE | Admit: 2023-06-05 | Discharge: 2023-06-05 | Disposition: A | Discharge status: Home / Self Care | Payer: Medicare HMO | Source: Ambulatory Visit | Attending: Radiation Oncology | Admitting: Radiation Oncology

## 2023-06-05 ENCOUNTER — Other Ambulatory Visit: Payer: Self-pay

## 2023-06-05 ENCOUNTER — Ambulatory Visit
Admission: RE | Admit: 2023-06-05 | Discharge: 2023-06-05 | Disposition: A | Discharge status: Home / Self Care | Source: Ambulatory Visit | Attending: Radiation Oncology | Admitting: Radiation Oncology

## 2023-06-05 DIAGNOSIS — F17210 Nicotine dependence, cigarettes, uncomplicated: Secondary | ICD-10-CM | POA: Diagnosis not present

## 2023-06-05 DIAGNOSIS — C3431 Malignant neoplasm of lower lobe, right bronchus or lung: Secondary | ICD-10-CM | POA: Diagnosis not present

## 2023-06-05 DIAGNOSIS — Z510 Encounter for antineoplastic radiation therapy: Secondary | ICD-10-CM | POA: Diagnosis not present

## 2023-06-05 LAB — RAD ONC ARIA SESSION SUMMARY
Course Elapsed Days: 32
Plan Fractions Treated to Date: 25
Plan Prescribed Dose Per Fraction: 2 Gy
Plan Total Fractions Prescribed: 30
Plan Total Prescribed Dose: 60 Gy
Reference Point Dosage Given to Date: 50 Gy
Reference Point Session Dosage Given: 2 Gy
Session Number: 25

## 2023-06-08 ENCOUNTER — Inpatient Hospital Stay: Payer: Medicare HMO

## 2023-06-08 ENCOUNTER — Ambulatory Visit: Payer: Medicare HMO

## 2023-06-08 ENCOUNTER — Other Ambulatory Visit: Payer: Self-pay

## 2023-06-08 ENCOUNTER — Ambulatory Visit
Admission: RE | Admit: 2023-06-08 | Discharge: 2023-06-08 | Disposition: A | Discharge status: Home / Self Care | Payer: Medicare HMO | Source: Ambulatory Visit | Attending: Radiation Oncology

## 2023-06-08 VITALS — BP 144/88 | HR 88 | Temp 98.2°F | Resp 18 | Wt 177.5 lb

## 2023-06-08 DIAGNOSIS — C3431 Malignant neoplasm of lower lobe, right bronchus or lung: Secondary | ICD-10-CM

## 2023-06-08 DIAGNOSIS — F17210 Nicotine dependence, cigarettes, uncomplicated: Secondary | ICD-10-CM | POA: Diagnosis not present

## 2023-06-08 DIAGNOSIS — Z95828 Presence of other vascular implants and grafts: Secondary | ICD-10-CM

## 2023-06-08 DIAGNOSIS — Z510 Encounter for antineoplastic radiation therapy: Secondary | ICD-10-CM | POA: Diagnosis not present

## 2023-06-08 LAB — CBC WITH DIFFERENTIAL (CANCER CENTER ONLY)
Abs Immature Granulocytes: 0.03 10*3/uL (ref 0.00–0.07)
Basophils Absolute: 0 10*3/uL (ref 0.0–0.1)
Basophils Relative: 1 %
Eosinophils Absolute: 0 10*3/uL (ref 0.0–0.5)
Eosinophils Relative: 0 %
HCT: 30.4 % — ABNORMAL LOW (ref 36.0–46.0)
Hemoglobin: 9.7 g/dL — ABNORMAL LOW (ref 12.0–15.0)
Immature Granulocytes: 1 %
Lymphocytes Relative: 10 %
Lymphs Abs: 0.3 10*3/uL — ABNORMAL LOW (ref 0.7–4.0)
MCH: 25.1 pg — ABNORMAL LOW (ref 26.0–34.0)
MCHC: 31.9 g/dL (ref 30.0–36.0)
MCV: 78.6 fL — ABNORMAL LOW (ref 80.0–100.0)
Monocytes Absolute: 0.4 10*3/uL (ref 0.1–1.0)
Monocytes Relative: 13 %
Neutro Abs: 2.2 10*3/uL (ref 1.7–7.7)
Neutrophils Relative %: 75 %
Platelet Count: 106 10*3/uL — ABNORMAL LOW (ref 150–400)
RBC: 3.87 MIL/uL (ref 3.87–5.11)
RDW: 18.7 % — ABNORMAL HIGH (ref 11.5–15.5)
WBC Count: 2.9 10*3/uL — ABNORMAL LOW (ref 4.0–10.5)
nRBC: 0 % (ref 0.0–0.2)

## 2023-06-08 LAB — CMP (CANCER CENTER ONLY)
ALT: 11 U/L (ref 0–44)
AST: 13 U/L — ABNORMAL LOW (ref 15–41)
Albumin: 3.5 g/dL (ref 3.5–5.0)
Alkaline Phosphatase: 68 U/L (ref 38–126)
Anion gap: 7 (ref 5–15)
BUN: 14 mg/dL (ref 8–23)
CO2: 25 mmol/L (ref 22–32)
Calcium: 8.1 mg/dL — ABNORMAL LOW (ref 8.9–10.3)
Chloride: 104 mmol/L (ref 98–111)
Creatinine: 0.97 mg/dL (ref 0.44–1.00)
GFR, Estimated: >60 mL/min (ref >60)
Glucose, Bld: 219 mg/dL — ABNORMAL HIGH (ref 70–99)
Potassium: 4.1 mmol/L (ref 3.5–5.1)
Sodium: 136 mmol/L (ref 135–145)
Total Bilirubin: 1.1 mg/dL (ref 0.0–1.2)
Total Protein: 7 g/dL (ref 6.5–8.1)

## 2023-06-08 LAB — RAD ONC ARIA SESSION SUMMARY
Course Elapsed Days: 35
Plan Fractions Treated to Date: 26
Plan Prescribed Dose Per Fraction: 2 Gy
Plan Total Fractions Prescribed: 30
Plan Total Prescribed Dose: 60 Gy
Reference Point Dosage Given to Date: 52 Gy
Reference Point Session Dosage Given: 2 Gy
Session Number: 26

## 2023-06-08 MED ORDER — SODIUM CHLORIDE 0.9 % IV SOLN
45.0000 mg/m2 | Freq: Once | INTRAVENOUS | Status: AC
  Administered 2023-06-08: 84 mg via INTRAVENOUS
  Filled 2023-06-08: qty 14

## 2023-06-08 MED ORDER — DEXAMETHASONE SODIUM PHOSPHATE 10 MG/ML IJ SOLN
10.0000 mg | Freq: Once | INTRAMUSCULAR | Status: AC
  Administered 2023-06-08: 10 mg via INTRAVENOUS
  Filled 2023-06-08: qty 1

## 2023-06-08 MED ORDER — PALONOSETRON HCL INJECTION 0.25 MG/5ML
0.2500 mg | Freq: Once | INTRAVENOUS | Status: AC
  Administered 2023-06-08: 0.25 mg via INTRAVENOUS
  Filled 2023-06-08: qty 5

## 2023-06-08 MED ORDER — DIPHENHYDRAMINE HCL 50 MG/ML IJ SOLN
25.0000 mg | Freq: Once | INTRAMUSCULAR | Status: AC
  Administered 2023-06-08: 25 mg via INTRAVENOUS
  Filled 2023-06-08: qty 1

## 2023-06-08 MED ORDER — FAMOTIDINE IN NACL 20-0.9 MG/50ML-% IV SOLN
20.0000 mg | Freq: Once | INTRAVENOUS | Status: AC
  Administered 2023-06-08: 20 mg via INTRAVENOUS
  Filled 2023-06-08: qty 50

## 2023-06-08 MED ORDER — SODIUM CHLORIDE 0.9 % IV SOLN
170.6000 mg | Freq: Once | INTRAVENOUS | Status: AC
  Administered 2023-06-08: 170 mg via INTRAVENOUS
  Filled 2023-06-08: qty 17

## 2023-06-08 MED ORDER — SODIUM CHLORIDE 0.9 % IV SOLN: INTRAVENOUS | Status: DC

## 2023-06-08 MED ORDER — SODIUM CHLORIDE 0.9% FLUSH
10.0000 mL | Freq: Once | INTRAVENOUS | Status: AC
  Administered 2023-06-08: 10 mL

## 2023-06-08 NOTE — Patient Instructions (Signed)
 [facility] - A DEPT OF [facility]  Discharge Instructions: Thank you for choosing [facility] to provide your oncology and hematology care.   If you have a lab appointment with the Cancer Center, please go directly to the Cancer Center and check in at the registration area.   Wear comfortable clothing and clothing appropriate for easy access to any Portacath or PICC line.   We strive to give you quality time with your provider. You may need to reschedule your appointment if you arrive late (15 or more minutes).  Arriving late affects you and other patients whose appointments are after yours.  Also, if you miss three or more appointments without notifying the office, you may be dismissed from the clinic at the provider's discretion.      For prescription refill requests, have your pharmacy contact our office and allow 72 hours for refills to be completed.    Today you received the following chemotherapy and/or immunotherapy agents: Paclitaxel and Carboplatin      To help prevent nausea and vomiting after your treatment, we encourage you to take your nausea medication as directed.  BELOW ARE SYMPTOMS THAT SHOULD BE REPORTED IMMEDIATELY: *FEVER GREATER THAN 100.4 F (38 C) OR HIGHER *CHILLS OR SWEATING *NAUSEA AND VOMITING THAT IS NOT CONTROLLED WITH YOUR NAUSEA MEDICATION *UNUSUAL SHORTNESS OF BREATH *UNUSUAL BRUISING OR BLEEDING *URINARY PROBLEMS (pain or burning when urinating, or frequent urination) *BOWEL PROBLEMS (unusual diarrhea, constipation, pain near the anus) TENDERNESS IN MOUTH AND THROAT WITH OR WITHOUT PRESENCE OF ULCERS (sore throat, sores in mouth, or a toothache) UNUSUAL RASH, SWELLING OR PAIN  UNUSUAL VAGINAL DISCHARGE OR ITCHING   Items with * indicate a potential emergency and should be followed up as soon as possible or go to the Emergency Department if any problems should occur.  Please show the CHEMOTHERAPY ALERT CARD  or IMMUNOTHERAPY ALERT CARD at check-in to the Emergency Department and triage nurse.  Should you have questions after your visit or need to cancel or reschedule your appointment, please contact [facility] - A DEPT [facility]  Dept: [phone]  and follow the prompts.  Office hours are 8:00 a.m. to 4:30 p.m. Monday - Friday. Please note that voicemails left after 4:00 p.m. may not be returned until the following business day.  We are closed weekends and major holidays. You have access to a nurse at all times for urgent questions. Please call the main number to the clinic Dept: [phone] and follow the prompts.   For any non-urgent questions, you may also contact your provider using MyChart. We now offer e-Visits for anyone 54 and older to request care online for non-urgent symptoms. For details visit [email or web address].   Also download the MyChart app! Go to the app store, search "MyChart", open the app, select [place], and log in with your MyChart username and password.

## 2023-06-09 ENCOUNTER — Ambulatory Visit: Payer: Medicare HMO

## 2023-06-09 ENCOUNTER — Ambulatory Visit
Admission: RE | Admit: 2023-06-09 | Discharge: 2023-06-09 | Disposition: A | Discharge status: Home / Self Care | Payer: Medicare HMO | Source: Ambulatory Visit | Attending: Radiation Oncology

## 2023-06-09 ENCOUNTER — Other Ambulatory Visit: Payer: Self-pay

## 2023-06-09 DIAGNOSIS — C3431 Malignant neoplasm of lower lobe, right bronchus or lung: Secondary | ICD-10-CM | POA: Diagnosis not present

## 2023-06-09 LAB — RAD ONC ARIA SESSION SUMMARY
Course Elapsed Days: 36
Plan Fractions Treated to Date: 27
Plan Prescribed Dose Per Fraction: 2 Gy
Plan Total Fractions Prescribed: 30
Plan Total Prescribed Dose: 60 Gy
Reference Point Dosage Given to Date: 54 Gy
Reference Point Session Dosage Given: 2 Gy
Session Number: 27

## 2023-06-10 ENCOUNTER — Ambulatory Visit
Admission: RE | Admit: 2023-06-10 | Discharge: 2023-06-10 | Disposition: A | Discharge status: Home / Self Care | Payer: Medicare HMO | Source: Ambulatory Visit | Attending: Radiation Oncology

## 2023-06-10 ENCOUNTER — Other Ambulatory Visit: Payer: Self-pay

## 2023-06-10 ENCOUNTER — Ambulatory Visit: Payer: Medicare HMO

## 2023-06-10 DIAGNOSIS — F17210 Nicotine dependence, cigarettes, uncomplicated: Secondary | ICD-10-CM | POA: Diagnosis not present

## 2023-06-10 DIAGNOSIS — Z510 Encounter for antineoplastic radiation therapy: Secondary | ICD-10-CM | POA: Diagnosis not present

## 2023-06-10 DIAGNOSIS — C3431 Malignant neoplasm of lower lobe, right bronchus or lung: Secondary | ICD-10-CM | POA: Diagnosis not present

## 2023-06-10 LAB — RAD ONC ARIA SESSION SUMMARY
Course Elapsed Days: 37
Plan Fractions Treated to Date: 28
Plan Prescribed Dose Per Fraction: 2 Gy
Plan Total Fractions Prescribed: 30
Plan Total Prescribed Dose: 60 Gy
Reference Point Dosage Given to Date: 56 Gy
Reference Point Session Dosage Given: 2 Gy
Session Number: 28

## 2023-06-11 ENCOUNTER — Other Ambulatory Visit: Payer: Self-pay

## 2023-06-11 ENCOUNTER — Ambulatory Visit: Payer: Medicare HMO

## 2023-06-11 DIAGNOSIS — C3431 Malignant neoplasm of lower lobe, right bronchus or lung: Secondary | ICD-10-CM | POA: Diagnosis not present

## 2023-06-11 DIAGNOSIS — F17210 Nicotine dependence, cigarettes, uncomplicated: Secondary | ICD-10-CM | POA: Diagnosis not present

## 2023-06-11 DIAGNOSIS — Z510 Encounter for antineoplastic radiation therapy: Secondary | ICD-10-CM | POA: Diagnosis not present

## 2023-06-11 LAB — RAD ONC ARIA SESSION SUMMARY
Course Elapsed Days: 38
Plan Fractions Treated to Date: 29
Plan Prescribed Dose Per Fraction: 2 Gy
Plan Total Fractions Prescribed: 30
Plan Total Prescribed Dose: 60 Gy
Reference Point Dosage Given to Date: 58 Gy
Reference Point Session Dosage Given: 2 Gy
Session Number: 29

## 2023-06-12 ENCOUNTER — Ambulatory Visit
Admission: RE | Admit: 2023-06-12 | Discharge: 2023-06-12 | Disposition: A | Discharge status: Home / Self Care | Source: Ambulatory Visit | Attending: Radiation Oncology | Admitting: Radiation Oncology

## 2023-06-12 ENCOUNTER — Other Ambulatory Visit: Payer: Self-pay

## 2023-06-12 ENCOUNTER — Ambulatory Visit
Admission: RE | Admit: 2023-06-12 | Discharge: 2023-06-12 | Disposition: A | Discharge status: Home / Self Care | Payer: Medicare HMO | Source: Ambulatory Visit | Attending: Radiation Oncology | Admitting: Radiation Oncology

## 2023-06-12 DIAGNOSIS — C3431 Malignant neoplasm of lower lobe, right bronchus or lung: Secondary | ICD-10-CM | POA: Diagnosis not present

## 2023-06-12 DIAGNOSIS — Z510 Encounter for antineoplastic radiation therapy: Secondary | ICD-10-CM | POA: Diagnosis not present

## 2023-06-12 DIAGNOSIS — F17210 Nicotine dependence, cigarettes, uncomplicated: Secondary | ICD-10-CM | POA: Diagnosis not present

## 2023-06-12 LAB — RAD ONC ARIA SESSION SUMMARY
Course Elapsed Days: 39
Plan Fractions Treated to Date: 30
Plan Prescribed Dose Per Fraction: 2 Gy
Plan Total Fractions Prescribed: 30
Plan Total Prescribed Dose: 60 Gy
Reference Point Dosage Given to Date: 60 Gy
Reference Point Session Dosage Given: 2 Gy
Session Number: 30

## 2023-06-15 ENCOUNTER — Inpatient Hospital Stay: Payer: Medicare HMO

## 2023-06-15 ENCOUNTER — Inpatient Hospital Stay (HOSPITAL_BASED_OUTPATIENT_CLINIC_OR_DEPARTMENT_OTHER): Payer: Medicare HMO | Admitting: Internal Medicine

## 2023-06-15 ENCOUNTER — Ambulatory Visit
Admission: RE | Admit: 2023-06-15 | Discharge: 2023-06-15 | Disposition: A | Discharge status: Home / Self Care | Payer: Medicare HMO | Source: Ambulatory Visit | Attending: Radiation Oncology | Admitting: Radiation Oncology

## 2023-06-15 ENCOUNTER — Other Ambulatory Visit: Payer: Self-pay

## 2023-06-15 VITALS — HR 106

## 2023-06-15 VITALS — BP 136/108 | HR 119 | Temp 97.8°F | Resp 17 | Ht 63.5 in | Wt 171.1 lb

## 2023-06-15 DIAGNOSIS — C3431 Malignant neoplasm of lower lobe, right bronchus or lung: Secondary | ICD-10-CM

## 2023-06-15 DIAGNOSIS — Z510 Encounter for antineoplastic radiation therapy: Secondary | ICD-10-CM | POA: Diagnosis not present

## 2023-06-15 DIAGNOSIS — R000 Tachycardia, unspecified: Secondary | ICD-10-CM

## 2023-06-15 DIAGNOSIS — F17210 Nicotine dependence, cigarettes, uncomplicated: Secondary | ICD-10-CM | POA: Diagnosis not present

## 2023-06-15 DIAGNOSIS — R69 Illness, unspecified: Secondary | ICD-10-CM | POA: Diagnosis not present

## 2023-06-15 DIAGNOSIS — C3490 Malignant neoplasm of unspecified part of unspecified bronchus or lung: Secondary | ICD-10-CM

## 2023-06-15 DIAGNOSIS — Z95828 Presence of other vascular implants and grafts: Secondary | ICD-10-CM

## 2023-06-15 LAB — CBC WITH DIFFERENTIAL (CANCER CENTER ONLY)
Abs Immature Granulocytes: 0.02 10*3/uL (ref 0.00–0.07)
Basophils Absolute: 0 10*3/uL (ref 0.0–0.1)
Basophils Relative: 1 %
Eosinophils Absolute: 0 10*3/uL (ref 0.0–0.5)
Eosinophils Relative: 0 %
HCT: 31.3 % — ABNORMAL LOW (ref 36.0–46.0)
Hemoglobin: 10.1 g/dL — ABNORMAL LOW (ref 12.0–15.0)
Immature Granulocytes: 1 %
Lymphocytes Relative: 17 %
Lymphs Abs: 0.4 10*3/uL — ABNORMAL LOW (ref 0.7–4.0)
MCH: 25.3 pg — ABNORMAL LOW (ref 26.0–34.0)
MCHC: 32.3 g/dL (ref 30.0–36.0)
MCV: 78.4 fL — ABNORMAL LOW (ref 80.0–100.0)
Monocytes Absolute: 0.3 10*3/uL (ref 0.1–1.0)
Monocytes Relative: 15 %
Neutro Abs: 1.5 10*3/uL — ABNORMAL LOW (ref 1.7–7.7)
Neutrophils Relative %: 66 %
Platelet Count: 138 10*3/uL — ABNORMAL LOW (ref 150–400)
RBC: 3.99 MIL/uL (ref 3.87–5.11)
RDW: 19.7 % — ABNORMAL HIGH (ref 11.5–15.5)
WBC Count: 2.2 10*3/uL — ABNORMAL LOW (ref 4.0–10.5)
nRBC: 0 % (ref 0.0–0.2)

## 2023-06-15 LAB — RAD ONC ARIA SESSION SUMMARY
Course Elapsed Days: 42
Plan Fractions Treated to Date: 1
Plan Prescribed Dose Per Fraction: 2 Gy
Plan Total Fractions Prescribed: 3
Plan Total Prescribed Dose: 6 Gy
Reference Point Dosage Given to Date: 2 Gy
Reference Point Session Dosage Given: 2 Gy
Session Number: 31

## 2023-06-15 LAB — CMP (CANCER CENTER ONLY)
ALT: 13 U/L (ref 0–44)
AST: 13 U/L — ABNORMAL LOW (ref 15–41)
Albumin: 3.7 g/dL (ref 3.5–5.0)
Alkaline Phosphatase: 79 U/L (ref 38–126)
Anion gap: 6 (ref 5–15)
BUN: 7 mg/dL — ABNORMAL LOW (ref 8–23)
CO2: 26 mmol/L (ref 22–32)
Calcium: 9.1 mg/dL (ref 8.9–10.3)
Chloride: 105 mmol/L (ref 98–111)
Creatinine: 0.75 mg/dL (ref 0.44–1.00)
GFR, Estimated: >60 mL/min (ref >60)
Glucose, Bld: 216 mg/dL — ABNORMAL HIGH (ref 70–99)
Potassium: 4.1 mmol/L (ref 3.5–5.1)
Sodium: 137 mmol/L (ref 135–145)
Total Bilirubin: 0.9 mg/dL (ref 0.0–1.2)
Total Protein: 7.4 g/dL (ref 6.5–8.1)

## 2023-06-15 MED ORDER — SODIUM CHLORIDE 0.9% FLUSH
10.0000 mL | Freq: Once | INTRAVENOUS | Status: AC
Start: 2023-06-15 — End: 2023-06-15
  Administered 2023-06-15: 10 mL

## 2023-06-15 MED ORDER — DIPHENHYDRAMINE HCL 50 MG/ML IJ SOLN
25.0000 mg | Freq: Once | INTRAMUSCULAR | Status: AC
  Administered 2023-06-15: 25 mg via INTRAVENOUS
  Filled 2023-06-15: qty 1

## 2023-06-15 MED ORDER — SODIUM CHLORIDE 0.9% FLUSH
10.0000 mL | INTRAVENOUS | Status: DC | PRN
  Administered 2023-06-15: 10 mL

## 2023-06-15 MED ORDER — PALONOSETRON HCL INJECTION 0.25 MG/5ML
0.2500 mg | Freq: Once | INTRAVENOUS | Status: AC
  Administered 2023-06-15: 0.25 mg via INTRAVENOUS
  Filled 2023-06-15: qty 5

## 2023-06-15 MED ORDER — SODIUM CHLORIDE 0.9 % IV SOLN: INTRAVENOUS | Status: DC

## 2023-06-15 MED ORDER — FAMOTIDINE IN NACL 20-0.9 MG/50ML-% IV SOLN
20.0000 mg | Freq: Once | INTRAVENOUS | Status: AC
  Administered 2023-06-15: 20 mg via INTRAVENOUS
  Filled 2023-06-15: qty 50

## 2023-06-15 MED ORDER — HEPARIN SOD (PORK) LOCK FLUSH 100 UNIT/ML IV SOLN
500.0000 [IU] | Freq: Once | INTRAVENOUS | Status: AC | PRN
  Administered 2023-06-15: 500 [IU]

## 2023-06-15 MED ORDER — SODIUM CHLORIDE 0.9 % IV SOLN
170.6000 mg | Freq: Once | INTRAVENOUS | Status: AC
  Administered 2023-06-15: 170 mg via INTRAVENOUS
  Filled 2023-06-15: qty 17

## 2023-06-15 MED ORDER — SODIUM CHLORIDE 0.9 % IV SOLN
45.0000 mg/m2 | Freq: Once | INTRAVENOUS | Status: AC
  Administered 2023-06-15: 84 mg via INTRAVENOUS
  Filled 2023-06-15: qty 14

## 2023-06-15 MED ORDER — SODIUM CHLORIDE 0.9 % IV SOLN: Freq: Once | INTRAVENOUS | Status: AC

## 2023-06-15 MED ORDER — DEXAMETHASONE SODIUM PHOSPHATE 10 MG/ML IJ SOLN
10.0000 mg | Freq: Once | INTRAMUSCULAR | Status: AC
  Administered 2023-06-15: 10 mg via INTRAVENOUS
  Filled 2023-06-15: qty 1

## 2023-06-15 NOTE — Progress Notes (Signed)
 [facility] [phone]   [phone]  OFFICE PROGRESS NOTE  Wendy Salisbury, MD [address] [place] [address]  DIAGNOSIS:   1) Stage IIIa (T1c, N2, M0) non-small cell lung cancer, adenocarcinoma presented with right lower lobe lung nodule in addition to right paratracheal lymphadenopathy diagnosed in February 2025.  Molecular studies by [id number] showed no actionable mutations and negative PD-L1 expression.   2) history of rheumatoid arthritis and currently on treatment with Plaquenil.  PRIOR THERAPY: None  CURRENT THERAPY: A course of concurrent chemoradiation with weekly carboplatin for AUC of 2 and paclitaxel 45 Mg/M2.  First dose May 04, 2023.  Status post 6 cycles.  INTERVAL HISTORY: Wendy [name] 78 y.o. female returns to the clinic today for follow-up visit accompanied by her daughter.  Discussed the use of AI scribe software for clinical note transcription with the patient, who gave verbal consent to proceed.  History of Present Illness   Wendy [name] is a 78 year old female with stage IIIA non-small cell lung cancer who presents for evaluation before the last cycle of her treatment.  She was diagnosed with stage IIIA non-small cell lung cancer, adenocarcinoma, in February 2025. There are no actionable mutations and negative PD-L1 expression. She is undergoing concurrent chemoradiation with weekly carboplatin and paclitaxel.  She experiences odynophagia and uses Carafate to alleviate this issue, although she finds the medication unpleasant. She manages her symptoms by consuming soft foods and avoiding very hot, cold, or spicy foods, as spicy foods exacerbate her symptoms.  Her white blood count is noted to be low.  She also experiences menopausal symptoms, specifically hot flashes, which persist at the age of 65.       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 Zenovia Jordan     ALLERGIES:  is allergic to aspirin and tramadol.  MEDICATIONS:  Current Outpatient Medications  Medication Sig Dispense Refill   ACCU-CHEK GUIDE TEST test strip USE TO TEST ONCE DAILY 100 strip 1   Accu-Chek Softclix Lancets lancets Test once per day and diagnosis code is E 11.9 and dispense for (ACCU-CHEK Soft Touch) 100 each 12   ALPRAZolam (XANAX) 1 MG tablet TAKE 1 TABLET (1 MG TOTAL) BY MOUTH AT BEDTIME AS NEEDED FOR ANXIETY OR SLEEP. 30 tablet 5   amLODipine (NORVASC) 10 MG tablet Take 1 tablet (10 mg total) by mouth daily. 90 tablet 3   atorvastatin (LIPITOR) 10 MG tablet TAKE 1 TABLET BY MOUTH EVERY DAY 90 tablet 3   Blood Glucose Monitoring Suppl (ACCU-CHEK GUIDE) w/Device KIT Use to test sugars daily. 1 kit 0   Blood Glucose Monitoring Suppl  DEVI 1 each by Does not apply route in the morning, at noon, and at bedtime. May substitute to any manufacturer covered by patient's insurance. 1 each 0   Blood Pressure Monitor KIT Use to check blood pressure. 1 kit 0   Cholecalciferol (VITAMIN D3) 20 MCG (800 UNIT) TABS Take 800 Units by mouth daily.     DULoxetine (CYMBALTA) 30 MG capsule Take 1 capsule (30 mg total) by mouth at bedtime.     fluticasone (FLONASE) 50 MCG/ACT nasal spray SPRAY 2 SPRAYS INTO EACH NOSTRIL EVERY DAY 48 mL 3   furosemide (LASIX) 20 MG tablet Take 1 tablet (20 mg total)  by mouth daily as needed (fluid retention). 90 tablet 3   Ginkgo Biloba (GNP GINGKO BILOBA EXTRACT PO) Take by mouth.     glipiZIDE (GLUCOTROL) 10 MG tablet TAKE 1 TABLET (10 MG TOTAL) BY MOUTH TWICE A DAY BEFORE A MEAL 180 tablet 0   Glucose Blood (BLOOD GLUCOSE TEST STRIPS) STRP 1 each by In Vitro route in the morning, at noon, and at bedtime. May substitute to any manufacturer covered by patient's insurance. 100 each 3   HYDROcodone-acetaminophen (NORCO) 10-325 MG tablet Take 1 tablet by mouth every 6 (six) hours as needed for moderate pain (pain score 4-6). 120 tablet 0   hydroxychloroquine (PLAQUENIL) 200 MG tablet Take 1 tablet (200 mg total) by mouth daily. 90 tablet 1   insulin glargine (LANTUS) 100 UNIT/ML Solostar Pen Inject 25 Units into the skin daily. 15 mL 4   Insulin Pen Needle 32G X 4 MM MISC Use 4x a day 300 each 3   Lancets (ACCU-CHEK SOFT TOUCH) lancets Test once per day and diagnosis code is E 11.9 and dispense for Aviva plus 100 each 3   levocetirizine (XYZAL) 5 MG tablet TAKE 1 TABLET BY MOUTH EVERY DAY IN THE EVENING 90 tablet 3   levocetirizine (XYZAL) 5 MG tablet Take 1 tablet (5 mg total) by mouth every evening. 90 tablet 3   lidocaine-prilocaine (EMLA) cream Apply to affected area once 30 g 3   metFORMIN (GLUCOPHAGE) 500 MG tablet Take 1 tablet (500 mg total) by mouth 2 (two) times daily with a meal. 180 tablet 3   mupirocin ointment (BACTROBAN) 2 % Apply 1 Application topically 2 (two) times daily. 22 g 5   omeprazole (PRILOSEC) 40 MG capsule TAKE 1 CAPSULE BY MOUTH EVERY DAY 90 capsule 3   ondansetron (ZOFRAN) 4 MG tablet Take 1 tablet (4 mg total) by mouth every 8 (eight) hours as needed for nausea or vomiting. 90 tablet 3   ondansetron (ZOFRAN) 8 MG tablet Take 1 tablet (8 mg total) by mouth every 8 (eight) hours as needed for nausea or vomiting. Start on the third day after chemotherapy. 30 tablet 1   polyethylene glycol (MIRALAX / GLYCOLAX) 17 g packet Take 17 g by  mouth daily as needed for mild constipation.     potassium chloride (KLOR-CON) 10 MEQ tablet Take 1 tablet (10 mEq total) by mouth daily. 90 tablet 3   pregabalin (LYRICA) 100 MG capsule Take 1 capsule (100 mg total) by mouth at bedtime. 90 capsule 1   prochlorperazine (COMPAZINE) 10 MG tablet Take 1 tablet (10 mg total) by mouth every 6 (six) hours as needed for nausea or vomiting. 30 tablet 1   RESTASIS 0.05 % ophthalmic emulsion Place 1 drop into both eyes 2 (two) times daily.     sitaGLIPtin (JANUVIA) 100 MG tablet Take 1 tablet (100  mg total) by mouth at bedtime. 90 tablet 0   sucralfate (CARAFATE) 1 g tablet Take 1 tablet (1 g total) by mouth 4 (four) times daily. Dissolve each tablet in 15 cc water before use. 120 tablet 2   No current facility-administered medications for this visit.    SURGICAL HISTORY:  Past Surgical History:  Procedure Laterality Date   BRONCHIAL BRUSHINGS  04/07/2023   Procedure: BRONCHIAL BRUSHINGS;  Surgeon: Denson Flake, MD;  Location: [facility] ENDOSCOPY;  Service: Pulmonary;;   BRONCHIAL NEEDLE ASPIRATION BIOPSY  04/07/2023   Procedure: BRONCHIAL NEEDLE ASPIRATION BIOPSIES;  Surgeon: Denson Flake, MD;  Location: [facility] ENDOSCOPY;  Service: Pulmonary;;   CATARACT EXTRACTION     COLONOSCOPY  11/23/2019   per Dr. Howard Macho, adenmatous polyp, repeat in 7 yrs   ESOPHAGOGASTRODUODENOSCOPY  06/20/2008   at [facility], clear    FINE NEEDLE ASPIRATION  04/07/2023   Procedure: FINE NEEDLE ASPIRATION (FNA) LINEAR;  Surgeon: Denson Flake, MD;  Location: [facility] ENDOSCOPY;  Service: Pulmonary;;   IR IMAGING GUIDED PORT INSERTION  04/30/2023   LUMBAR LAMINECTOMY     1995   ROTATOR CUFF REPAIR Right    SPINAL FUSION N/A 07/29/2022   Procedure: T10-PELVIS INSTRUMENTATION/ T10-L4 POSTERIOR SPINAL FUSION;  Surgeon: Diedra Fowler, MD;  Location: [facility] OR;  Service: Orthopedics;  Laterality: N/A;   UPPER GASTROINTESTINAL ENDOSCOPY  2010   [facility]    VIDEO BRONCHOSCOPY WITH  ENDOBRONCHIAL ULTRASOUND N/A 04/07/2023   Procedure: VIDEO BRONCHOSCOPY WITH ENDOBRONCHIAL ULTRASOUND;  Surgeon: Denson Flake, MD;  Location: [facility] ENDOSCOPY;  Service: Pulmonary;  Laterality: N/A;    REVIEW OF SYSTEMS:  A comprehensive review of systems was negative except for: Constitutional: positive for fatigue Gastrointestinal: positive for odynophagia Musculoskeletal: positive for arthralgias   PHYSICAL EXAMINATION: General appearance: alert, cooperative, fatigued, and no distress Head: Normocephalic, without obvious abnormality, atraumatic Neck: no adenopathy, no JVD, supple, symmetrical, trachea midline, and thyroid not enlarged, symmetric, no tenderness/mass/nodules Lymph nodes: Cervical, supraclavicular, and axillary nodes normal. Resp: clear to auscultation bilaterally Back: symmetric, no curvature. ROM normal. No CVA tenderness. Cardio: regular rate and rhythm, S1, S2 normal, no murmur, click, rub or gallop GI: soft, non-tender; bowel sounds normal; no masses,  no organomegaly Extremities: extremities normal, atraumatic, no cyanosis or edema  ECOG PERFORMANCE STATUS: 1 - Symptomatic but completely ambulatory  Blood pressure (!) 136/108, pulse (!) 119, temperature 97.8 F (36.6 C), temperature source Temporal, resp. rate 17, height 5' 3.5" (1.613 m), weight 171 lb 1.6 oz (77.6 kg), SpO2 100%.  LABORATORY DATA: Lab Results  Component Value Date   WBC 2.9 (L) 06/08/2023   HGB 9.7 (L) 06/08/2023   HCT 30.4 (L) 06/08/2023   MCV 78.6 (L) 06/08/2023   PLT 106 (L) 06/08/2023      Chemistry      Component Value Date/Time   NA 136 06/08/2023 0821   K 4.1 06/08/2023 0821   CL 104 06/08/2023 0821   CO2 25 06/08/2023 0821   BUN 14 06/08/2023 0821   CREATININE 0.97 06/08/2023 0821   CREATININE 1.07 (H) 09/16/2019 1511      Component Value Date/Time   CALCIUM 8.1 (L) 06/08/2023 0821   ALKPHOS 68 06/08/2023 0821   AST 13 (L) 06/08/2023 0821   ALT 11 06/08/2023 0821    BILITOT 1.1 06/08/2023 0821       RADIOGRAPHIC STUDIES: No results found.   ASSESSMENT AND PLAN: This is a very pleasant 78 years old African-American female  with Stage IIIa (T1c, N2, M0) non-small cell lung cancer, adenocarcinoma presented with right lower lobe lung nodule in addition to right paratracheal lymphadenopathy diagnosed in February 2025. Molecular studies by [id number] showed no actionable mutations and negative PD-L1 expression. She also has a history of rheumatoid arthritis and currently on treatment with Plaquenil. She is currently undergoing a course of concurrent chemoradiation with weekly carboplatin for AUC of 2 and paclitaxel 45 Mg/M2.  First dose May 04, 2023.  States post 6 cycles The patient continues to tolerate this treatment fairly well except for odynophagia.    Stage IIIA non-small cell lung cancer, adenocarcinoma Diagnosed in February 2025, currently undergoing concurrent chemoradiation with weekly carboplatin and paclitaxel. She is nearing the end of treatment with two radiation sessions and the final chemotherapy session today. Experiencing odynophagia, likely due to treatment side effects. Despite leukopenia, treatment will proceed as planned since this is the last cycle. A follow-up CT scan of the chest is planned to assess treatment efficacy, as PET scans are reserved for initial staging or future issues due to cost. - Proceed with the final cycle of chemotherapy and radiation. - Advise dietary modifications to manage odynophagia: consume soft, non-spicy, non-extreme temperature foods. - Schedule a follow-up appointment in one month with a repeat CT scan of the chest one week prior. - Instruct to call immediately if fever or chills develop due to leukopenia.  Menopausal symptoms Experiencing vasomotor symptoms, including hot flashes and sweating, persisting at age 10.   She was advised to call immediately if she has any concerning symptoms in the  interval. The patient voices understanding of current disease status and treatment options and is in agreement with the current care plan.  All questions were answered. The patient knows to call the clinic with any problems, questions or concerns. We can certainly see the patient much sooner if necessary.  The total time spent in the appointment was 20 minutes.  Disclaimer: This note was dictated with voice recognition software. Similar sounding words can inadvertently be transcribed and may not be corrected upon review.

## 2023-06-15 NOTE — Patient Instructions (Signed)
 [facility] - A DEPT OF [facility]  Discharge Instructions: Thank you for choosing [facility] to provide your oncology and hematology care.   If you have a lab appointment with the Cancer Center, please go directly to the Cancer Center and check in at the registration area.   Wear comfortable clothing and clothing appropriate for easy access to any Portacath or PICC line.   We strive to give you quality time with your provider. You may need to reschedule your appointment if you arrive late (15 or more minutes).  Arriving late affects you and other patients whose appointments are after yours.  Also, if you miss three or more appointments without notifying the office, you may be dismissed from the clinic at the provider's discretion.      For prescription refill requests, have your pharmacy contact our office and allow 72 hours for refills to be completed.    Today you received the following chemotherapy and/or immunotherapy agents CARBOplatin (PARAPLATIN) and PACLitaxel (TAXOL)       To help prevent nausea and vomiting after your treatment, we encourage you to take your nausea medication as directed.  BELOW ARE SYMPTOMS THAT SHOULD BE REPORTED IMMEDIATELY: *FEVER GREATER THAN 100.4 F (38 C) OR HIGHER *CHILLS OR SWEATING *NAUSEA AND VOMITING THAT IS NOT CONTROLLED WITH YOUR NAUSEA MEDICATION *UNUSUAL SHORTNESS OF BREATH *UNUSUAL BRUISING OR BLEEDING *URINARY PROBLEMS (pain or burning when urinating, or frequent urination) *BOWEL PROBLEMS (unusual diarrhea, constipation, pain near the anus) TENDERNESS IN MOUTH AND THROAT WITH OR WITHOUT PRESENCE OF ULCERS (sore throat, sores in mouth, or a toothache) UNUSUAL RASH, SWELLING OR PAIN  UNUSUAL VAGINAL DISCHARGE OR ITCHING   Items with * indicate a potential emergency and should be followed up as soon as possible or go to the Emergency Department if any problems should occur.  Please show the  CHEMOTHERAPY ALERT CARD or IMMUNOTHERAPY ALERT CARD at check-in to the Emergency Department and triage nurse.  Should you have questions after your visit or need to cancel or reschedule your appointment, please contact [facility] - A DEPT OF [facility]  Dept: [phone]  and follow the prompts.  Office hours are 8:00 a.m. to 4:30 p.m. Monday - Friday. Please note that voicemails left after 4:00 p.m. may not be returned until the following business day.  We are closed weekends and major holidays. You have access to a nurse at all times for urgent questions. Please call the main number to the clinic Dept: [phone] and follow the prompts.   For any non-urgent questions, you may also contact your provider using MyChart. We now offer e-Visits for anyone 23 and older to request care online for non-urgent symptoms. For details visit [email or web address].   Also download the MyChart app! Go to the app store, search "MyChart", open the app, select [place], and log in with your MyChart username and password.

## 2023-06-16 ENCOUNTER — Other Ambulatory Visit: Payer: Self-pay

## 2023-06-16 ENCOUNTER — Ambulatory Visit
Admission: RE | Admit: 2023-06-16 | Discharge: 2023-06-16 | Disposition: A | Discharge status: Home / Self Care | Payer: Medicare HMO | Source: Ambulatory Visit | Attending: Radiation Oncology | Admitting: Radiation Oncology

## 2023-06-16 DIAGNOSIS — C3431 Malignant neoplasm of lower lobe, right bronchus or lung: Secondary | ICD-10-CM | POA: Diagnosis not present

## 2023-06-16 LAB — RAD ONC ARIA SESSION SUMMARY
Course Elapsed Days: 43
Plan Fractions Treated to Date: 2
Plan Prescribed Dose Per Fraction: 2 Gy
Plan Total Fractions Prescribed: 3
Plan Total Prescribed Dose: 6 Gy
Reference Point Dosage Given to Date: 4 Gy
Reference Point Session Dosage Given: 2 Gy
Session Number: 32

## 2023-06-17 ENCOUNTER — Ambulatory Visit
Admission: RE | Admit: 2023-06-17 | Discharge: 2023-06-17 | Disposition: A | Discharge status: Home / Self Care | Payer: Medicare HMO | Source: Ambulatory Visit | Attending: Radiation Oncology | Admitting: Radiation Oncology

## 2023-06-17 ENCOUNTER — Other Ambulatory Visit: Payer: Self-pay

## 2023-06-17 ENCOUNTER — Ambulatory Visit

## 2023-06-17 DIAGNOSIS — C3431 Malignant neoplasm of lower lobe, right bronchus or lung: Secondary | ICD-10-CM | POA: Diagnosis not present

## 2023-06-17 LAB — RAD ONC ARIA SESSION SUMMARY
Course Elapsed Days: 44
Plan Fractions Treated to Date: 3
Plan Prescribed Dose Per Fraction: 2 Gy
Plan Total Fractions Prescribed: 3
Plan Total Prescribed Dose: 6 Gy
Reference Point Dosage Given to Date: 6 Gy
Reference Point Session Dosage Given: 2 Gy
Session Number: 33

## 2023-06-18 NOTE — Radiation Completion Notes (Addendum)
  Radiation Oncology         [phone] ________________________________  Name: Wendy [name] MRN: [id number]  Date of Service: 06/17/2023  DOB: 11-15-1945  End of Treatment Note   Diagnosis:  Stage IIIA, cT1cN2M0, NSCLC, adenocarcinoma of the RLL   Intent: Curative     ==========DELIVERED PLANS==========  First Treatment Date: 2023-05-04 Last Treatment Date: 2023-06-17   Plan Name: Lung_R Site: Lung, Right Technique: 3D Mode: Photon Dose Per Fraction: 2 Gy Prescribed Dose (Delivered / Prescribed): 60 Gy / 60 Gy Prescribed Fxs (Delivered / Prescribed): 30 / 30   Plan Name: Lung_R_Bst Site: Lung, Right Technique: 3D Mode: Photon Dose Per Fraction: 2 Gy Prescribed Dose (Delivered / Prescribed): 6 Gy / 6 Gy Prescribed Fxs (Delivered / Prescribed): 3 / 3     ==========ON TREATMENT VISIT DATES========== 2023-05-08, 2023-05-14, 2023-05-22, 2023-05-29, 2023-06-05, 2023-06-12, 2023-06-17   The patient tolerated radiation. She developed fatigue and anticipated esophagitis related to her therapy fields.  The patient will receive a call in about one month from the radiation oncology department. She will continue follow up with Dr. Marguerita Shih as well.      Shelvia Dick, PAC

## 2023-06-19 ENCOUNTER — Encounter: Payer: Self-pay | Admitting: Internal Medicine

## 2023-06-23 ENCOUNTER — Ambulatory Visit (INDEPENDENT_AMBULATORY_CARE_PROVIDER_SITE_OTHER): Admitting: Family Medicine

## 2023-06-23 ENCOUNTER — Encounter: Payer: Self-pay | Admitting: Family Medicine

## 2023-06-23 VITALS — BP 126/62 | HR 101 | Temp 98.2°F | Wt 175.0 lb

## 2023-06-23 DIAGNOSIS — M48062 Spinal stenosis, lumbar region with neurogenic claudication: Secondary | ICD-10-CM | POA: Diagnosis not present

## 2023-06-23 DIAGNOSIS — F1190 Opioid use, unspecified, uncomplicated: Secondary | ICD-10-CM | POA: Diagnosis not present

## 2023-06-23 MED ORDER — HYDROCODONE-ACETAMINOPHEN 10-325 MG PO TABS: 1.0000 | ORAL_TABLET | Freq: Four times a day (QID) | ORAL | 0 refills | Status: DC | PRN

## 2023-06-23 NOTE — Progress Notes (Signed)
   Subjective:    Patient ID: Wendy [name], female    DOB: [birth year], 78 y.o.   MRN: [id number]  HPI Here for pain management. She is doing well.    Review of Systems  Constitutional: Negative.   Musculoskeletal:  Positive for back pain.       Objective:   Physical Exam Constitutional:      Comments: In a wheelchair   Neurological:     Mental Status: She is alert.           Assessment & Plan:  Pain management.  Indication for chronic opioid: low back pain Medication and dose: Norco 10-325 # pills per month: 120 Last UDS date: 12-17-22 Opioid Treatment Agreement signed (Y/N): 06-19-17 Opioid Treatment Agreement last reviewed with patient:  06-23-23 NCCSRS reviewed this encounter (include red flags): Yes Meds were refilled.  Corita Diego, MD

## 2023-06-25 ENCOUNTER — Other Ambulatory Visit: Payer: Self-pay

## 2023-06-25 ENCOUNTER — Telehealth: Payer: Self-pay | Admitting: Medical Oncology

## 2023-06-25 ENCOUNTER — Emergency Department (HOSPITAL_COMMUNITY)
Admission: EM | Admit: 2023-06-25 | Discharge: 2023-06-25 | Disposition: A | Discharge status: Home / Self Care | Attending: Emergency Medicine | Admitting: Emergency Medicine

## 2023-06-25 ENCOUNTER — Emergency Department (HOSPITAL_COMMUNITY)

## 2023-06-25 DIAGNOSIS — R000 Tachycardia, unspecified: Secondary | ICD-10-CM | POA: Diagnosis not present

## 2023-06-25 DIAGNOSIS — Z85118 Personal history of other malignant neoplasm of bronchus and lung: Secondary | ICD-10-CM | POA: Diagnosis not present

## 2023-06-25 DIAGNOSIS — R1110 Vomiting, unspecified: Secondary | ICD-10-CM | POA: Diagnosis not present

## 2023-06-25 DIAGNOSIS — R1013 Epigastric pain: Secondary | ICD-10-CM | POA: Insufficient documentation

## 2023-06-25 DIAGNOSIS — R5383 Other fatigue: Secondary | ICD-10-CM | POA: Insufficient documentation

## 2023-06-25 DIAGNOSIS — R112 Nausea with vomiting, unspecified: Secondary | ICD-10-CM | POA: Diagnosis not present

## 2023-06-25 DIAGNOSIS — R109 Unspecified abdominal pain: Secondary | ICD-10-CM | POA: Diagnosis not present

## 2023-06-25 LAB — COMPREHENSIVE METABOLIC PANEL WITH GFR
ALT: 16 U/L (ref 0–44)
AST: 19 U/L (ref 15–41)
Albumin: 3.5 g/dL (ref 3.5–5.0)
Alkaline Phosphatase: 81 U/L (ref 38–126)
Anion gap: 9 (ref 5–15)
BUN: 8 mg/dL (ref 8–23)
CO2: 27 mmol/L (ref 22–32)
Calcium: 9.3 mg/dL (ref 8.9–10.3)
Chloride: 103 mmol/L (ref 98–111)
Creatinine, Ser: 0.83 mg/dL (ref 0.44–1.00)
GFR, Estimated: >60 mL/min (ref >60)
Glucose, Bld: 122 mg/dL — ABNORMAL HIGH (ref 70–99)
Potassium: 3.5 mmol/L (ref 3.5–5.1)
Sodium: 139 mmol/L (ref 135–145)
Total Bilirubin: 0.8 mg/dL (ref 0.0–1.2)
Total Protein: 7.6 g/dL (ref 6.5–8.1)

## 2023-06-25 LAB — CBC
HCT: 33.4 % — ABNORMAL LOW (ref 36.0–46.0)
Hemoglobin: 10.4 g/dL — ABNORMAL LOW (ref 12.0–15.0)
MCH: 26 pg (ref 26.0–34.0)
MCHC: 31.1 g/dL (ref 30.0–36.0)
MCV: 83.5 fL (ref 80.0–100.0)
Platelets: 230 10*3/uL (ref 150–400)
RBC: 4 MIL/uL (ref 3.87–5.11)
RDW: 22.4 % — ABNORMAL HIGH (ref 11.5–15.5)
WBC: 3 10*3/uL — ABNORMAL LOW (ref 4.0–10.5)
nRBC: 1.3 % — ABNORMAL HIGH (ref 0.0–0.2)

## 2023-06-25 LAB — URINALYSIS, ROUTINE W REFLEX MICROSCOPIC
Bilirubin Urine: NEGATIVE
Glucose, UA: NEGATIVE mg/dL
Hgb urine dipstick: NEGATIVE
Ketones, ur: NEGATIVE mg/dL
Leukocytes,Ua: NEGATIVE
Nitrite: NEGATIVE
Protein, ur: NEGATIVE mg/dL
Specific Gravity, Urine: 1.006 (ref 1.005–1.030)
pH: 8 (ref 5.0–8.0)

## 2023-06-25 LAB — LIPASE, BLOOD: Lipase: 30 U/L (ref 11–51)

## 2023-06-25 MED ORDER — METOCLOPRAMIDE HCL 5 MG/ML IJ SOLN
10.0000 mg | Freq: Once | INTRAMUSCULAR | Status: AC
  Administered 2023-06-25: 10 mg via INTRAVENOUS
  Filled 2023-06-25: qty 2

## 2023-06-25 MED ORDER — SODIUM CHLORIDE 0.9 % IV BOLUS
1000.0000 mL | Freq: Once | INTRAVENOUS | Status: AC
  Administered 2023-06-25: 1000 mL via INTRAVENOUS

## 2023-06-25 MED ORDER — SODIUM CHLORIDE 0.9 % IV SOLN
12.5000 mg | Freq: Once | INTRAVENOUS | Status: AC
  Administered 2023-06-25: 12.5 mg via INTRAVENOUS
  Filled 2023-06-25: qty 12.5

## 2023-06-25 MED ORDER — DIPHENHYDRAMINE HCL 50 MG/ML IJ SOLN
25.0000 mg | Freq: Once | INTRAMUSCULAR | Status: AC
  Administered 2023-06-25: 25 mg via INTRAVENOUS
  Filled 2023-06-25: qty 1

## 2023-06-25 MED ORDER — METOCLOPRAMIDE HCL 10 MG PO TABS: 10.0000 mg | ORAL_TABLET | Freq: Three times a day (TID) | ORAL | 0 refills | Status: AC

## 2023-06-25 NOTE — Discharge Instructions (Addendum)
 You were seen today for nausea and vomiting, likely related to your chemotherapy and radiation treatments.  Your lab work did not show any acute concerns.  I would recommend that you follow-up with your primary care doctor as well as your oncologist outpatient to continue to monitor your symptoms.  I will order some Reglan  for you to take at home which can help with your nausea as well.

## 2023-06-25 NOTE — ED Triage Notes (Signed)
 Pt c/o N/V and mild abd pain for 1x week. Limited PO intake.  Zofran  and compazine  did not help.

## 2023-06-25 NOTE — ED Provider Notes (Signed)
 [place] EMERGENCY DEPARTMENT AT [facility] Provider Note   CSN: [id number] Arrival date & time: 06/25/23  1535     History  Chief Complaint  Patient presents with   Nausea   Emesis   Abdominal Pain    Wendy [name] is a 78 y.o. female.  This is a pleasant 78 year old female with past medical history of lung cancer presenting with greater than 1 week of persistent nausea and vomiting.  She states she had her last chemo session on April 14 and her last radiation session on the 16th.  She states since her final radiation treatment she has had persistent nausea and vomiting.  Her vomiting is constant and not related to eating, though the patient endorses significant dysphagia as well.  She states that the pain is mostly substernal/upper abdomen she endorses ongoing pain in her back and around her port site which is not new.  She denies hematemesis, hematochezia, bilious emesis, constipation, or diarrhea.  She states that her vomitus is " slime like" in consistency.  Thankfully denies fevers, chills, night sweats.   Emesis Associated symptoms: abdominal pain   Associated symptoms: no chills, no diarrhea and no fever   Abdominal Pain Associated symptoms: fatigue, nausea and vomiting   Associated symptoms: no chills, no constipation, no diarrhea and no fever       Home Medications Prior to Admission medications   Medication Sig Start Date End Date Taking? Authorizing Provider  metoCLOPramide  (REGLAN ) 10 MG tablet Take 1 tablet (10 mg total) by mouth 3 (three) times daily with meals for 5 days. 06/25/23 06/30/23 Yes Sheree Dieter, MD  ACCU-CHEK GUIDE TEST test strip USE TO TEST ONCE DAILY 03/30/23   Donley Furth, MD  Accu-Chek Softclix Lancets lancets Test once per day and diagnosis code is E 11.9 and dispense for (ACCU-CHEK Soft Touch) 06/25/22   Donley Furth, MD  ALPRAZolam  (XANAX ) 1 MG tablet TAKE 1 TABLET (1 MG TOTAL) BY MOUTH AT BEDTIME AS NEEDED FOR ANXIETY  OR SLEEP. 05/13/23   Donley Furth, MD  amLODipine  (NORVASC ) 10 MG tablet Take 1 tablet (10 mg total) by mouth daily. 03/24/23   Donley Furth, MD  atorvastatin  (LIPITOR) 10 MG tablet TAKE 1 TABLET BY MOUTH EVERY DAY 04/21/23   Donley Furth, MD  Blood Glucose Monitoring Suppl (ACCU-CHEK GUIDE) w/Device KIT Use to test sugars daily. 02/07/21   Donley Furth, MD  Blood Glucose Monitoring Suppl DEVI 1 each by Does not apply route in the morning, at noon, and at bedtime. May substitute to any manufacturer covered by patient's insurance. 04/21/23   Thapa, [place], MD  Blood Pressure Monitor KIT Use to check blood pressure. 02/17/20   Donley Furth, MD  Cholecalciferol (VITAMIN D3) 20 MCG (800 UNIT) TABS Take 800 Units by mouth daily.    [provider]  DULoxetine  (CYMBALTA ) 30 MG capsule Take 1 capsule (30 mg total) by mouth at bedtime. 04/07/23   Byrum, Robert S, MD  fluticasone  (FLONASE ) 50 MCG/ACT nasal spray SPRAY 2 SPRAYS INTO EACH NOSTRIL EVERY DAY 03/09/23   Donley Furth, MD  furosemide  (LASIX ) 20 MG tablet Take 1 tablet (20 mg total) by mouth daily as needed (fluid retention). 01/21/23   Donley Furth, MD  Ginkgo Biloba (GNP GINGKO BILOBA EXTRACT PO) Take by mouth.    [provider]  glipiZIDE  (GLUCOTROL ) 10 MG tablet TAKE 1 TABLET (10 MG TOTAL) BY MOUTH TWICE A DAY BEFORE A  MEAL 05/27/23   Donley Furth, MD  Glucose Blood (BLOOD GLUCOSE TEST STRIPS) STRP 1 each by In Vitro route in the morning, at noon, and at bedtime. May substitute to any manufacturer covered by patient's insurance. 04/21/23 09/01/23  Thapa, [place], MD  HYDROcodone -acetaminophen  (NORCO) 10-325 MG tablet Take 1 tablet by mouth every 6 (six) hours as needed for moderate pain (pain score 4-6). 06/23/23   Donley Furth, MD  HYDROcodone -acetaminophen  (NORCO) 10-325 MG tablet Take 1 tablet by mouth every 6 (six) hours as needed for moderate pain (pain score 4-6). 06/23/23   Donley Furth, MD  HYDROcodone -acetaminophen   (NORCO) 10-325 MG tablet Take 1 tablet by mouth every 6 (six) hours as needed for moderate pain (pain score 4-6). 06/23/23   Donley Furth, MD  hydroxychloroquine  (PLAQUENIL ) 200 MG tablet Take 1 tablet (200 mg total) by mouth daily. 05/21/23   Donley Furth, MD  insulin  glargine (LANTUS ) 100 UNIT/ML Solostar Pen Inject 25 Units into the skin daily. 04/21/23   Thapa, [place], MD  Insulin  Pen Needle 32G X 4 MM MISC Use 4x a day 04/21/23   Thapa, [place], MD  Lancets (ACCU-CHEK SOFT TOUCH) lancets Test once per day and diagnosis code is E 11.9 and dispense for Aviva plus 02/17/20   Donley Furth, MD  levocetirizine (XYZAL ) 5 MG tablet TAKE 1 TABLET BY MOUTH EVERY DAY IN THE EVENING 05/13/23   Donley Furth, MD  levocetirizine (XYZAL ) 5 MG tablet Take 1 tablet (5 mg total) by mouth every evening. 05/13/23   Donley Furth, MD  lidocaine -prilocaine  (EMLA ) cream Apply to affected area once 04/22/23   Marlene Simas, MD  metFORMIN  (GLUCOPHAGE ) 500 MG tablet Take 1 tablet (500 mg total) by mouth 2 (two) times daily with a meal. 01/21/23   Donley Furth, MD  mupirocin  ointment (BACTROBAN ) 2 % Apply 1 Application topically 2 (two) times daily. 01/09/23   Donley Furth, MD  omeprazole  (PRILOSEC) 40 MG capsule TAKE 1 CAPSULE BY MOUTH EVERY DAY 03/09/23   Donley Furth, MD  ondansetron  (ZOFRAN ) 4 MG tablet Take 1 tablet (4 mg total) by mouth every 8 (eight) hours as needed for nausea or vomiting. 03/24/23   Donley Furth, MD  ondansetron  (ZOFRAN ) 8 MG tablet Take 1 tablet (8 mg total) by mouth every 8 (eight) hours as needed for nausea or vomiting. Start on the third day after chemotherapy. 04/22/23   Marlene Simas, MD  polyethylene glycol (MIRALAX  / GLYCOLAX ) 17 g packet Take 17 g by mouth daily as needed for mild constipation.    [provider]  potassium chloride  (KLOR-CON ) 10 MEQ tablet Take 1 tablet (10 mEq total) by mouth daily. 06/25/22   Donley Furth, MD  pregabalin  (LYRICA ) 100 MG capsule Take 1  capsule (100 mg total) by mouth at bedtime. 05/25/23   Donley Furth, MD  prochlorperazine  (COMPAZINE ) 10 MG tablet Take 1 tablet (10 mg total) by mouth every 6 (six) hours as needed for nausea or vomiting. 04/22/23   Marlene Simas, MD  RESTASIS  0.05 % ophthalmic emulsion Place 1 drop into both eyes 2 (two) times daily. 02/01/22   [provider]  sitaGLIPtin  (JANUVIA ) 100 MG tablet Take 1 tablet (100 mg total) by mouth at bedtime. 05/21/23   Donley Furth, MD  sucralfate  (CARAFATE ) 1 g tablet Take 1 tablet (1 g total) by mouth 4 (four) times daily. Dissolve each tablet in 15 cc water before use. 05/22/23  Johna Myers, MD      Allergies    Aspirin and Tramadol    Review of Systems   Review of Systems  Constitutional:  Positive for fatigue. Negative for chills and fever.  Cardiovascular:  Negative for leg swelling.  Gastrointestinal:  Positive for abdominal pain, nausea and vomiting. Negative for blood in stool, constipation and diarrhea.  Genitourinary:  Negative for difficulty urinating.  Musculoskeletal:  Positive for back pain.  Skin:  Negative for rash.    Physical Exam Updated Vital Signs BP (!) 168/81   Pulse (!) 107   Temp 98.6 F (37 C) (Oral)   Resp (!) 22   Ht 5\' 3"  (1.6 m)   SpO2 99%   BMI 31.00 kg/m  Physical Exam Constitutional:      General: She is not in acute distress.    Appearance: She is well-developed.  Cardiovascular:     Rate and Rhythm: Normal rate and regular rhythm.     Heart sounds: Normal heart sounds.  Pulmonary:     Effort: Pulmonary effort is normal.     Breath sounds: Normal breath sounds.  Abdominal:     General: Abdomen is flat. Bowel sounds are normal. There is no distension.     Palpations: Abdomen is soft.     Comments: Minimal tenderness to palpation of the epigastrium, otherwise no abdominal tenderness.  Patient states majority of her pain is actually relatively substernal.  Skin:    General: Skin is warm and dry.      Comments: Chemo-Port site without significant erythema, purulence, or discharge appreciated.  Neurological:     Mental Status: She is alert.  Psychiatric:     Comments: Pleasant affect     ED Results / Procedures / Treatments   Labs (all labs ordered are listed, but only abnormal results are displayed) Labs Reviewed  COMPREHENSIVE METABOLIC PANEL WITH GFR - Abnormal; Notable for the following components:      Result Value   Glucose, Bld 122 (*)    All other components within normal limits  CBC - Abnormal; Notable for the following components:   WBC 3.0 (*)    Hemoglobin 10.4 (*)    HCT 33.4 (*)    RDW 22.4 (*)    nRBC 1.3 (*)    All other components within normal limits  URINALYSIS, ROUTINE W REFLEX MICROSCOPIC - Abnormal; Notable for the following components:   Color, Urine STRAW (*)    All other components within normal limits  LIPASE, BLOOD    EKG None  Radiology DG ABD ACUTE 2+V W 1V CHEST Result Date: 06/25/2023 CLINICAL DATA:  ab pain, vomiting EXAM: DG ABDOMEN ACUTE WITH 1 VIEW CHEST COMPARISON:  April 07, 2023 FINDINGS: The study was significantly degraded by patient's body habitus. No findings to suggest small bowel obstruction. No pneumoperitoneum. No organomegaly or radiopaque calculi. No acute fracture or destructive lesion. Extensive thoracolumbar fusion hardware with bilateral sacroiliac fixation. No cardiomegaly. Right chest port terminates in the lower SVC. No focal airspace consolidation, pleural effusion, or pneumothorax. IMPRESSION: The study was significantly degraded by patient's body habitus. Otherwise, no findings to suggest small bowel obstruction. Electronically Signed   By: Rance Burrows M.D.   On: 06/25/2023 18:24    Procedures Procedures    Medications Ordered in ED Medications  sodium chloride  0.9 % bolus 1,000 mL (0 mLs Intravenous Stopped 06/25/23 1838)  promethazine  (PHENERGAN ) 12.5 mg in sodium chloride  0.9 % 50 mL IVPB (0 mg Intravenous  Stopped  06/25/23 1721)  metoCLOPramide  (REGLAN ) injection 10 mg (10 mg Intravenous Given 06/25/23 1701)  diphenhydrAMINE  (BENADRYL ) injection 25 mg (25 mg Intravenous Given 06/25/23 1702)    ED Course/ Medical Decision Making/ A&P                                 Medical Decision Making Amount and/or Complexity of Data Reviewed Labs: ordered. Radiology: ordered. ECG/medicine tests: ordered.  Risk Prescription drug management.   78 year old female with lung cancer currently undergoing chemoradiation treatment presenting with substernal chest pain/epigastric abdominal pain.  Additionally she has dysphagia, which per oncology notes is a anticipated side effect of her treatment.  She has had intractable nausea vomiting and minimal ability to tolerate p.o. since her most recent radiation treatment.  On arrival she is hypertensive and tachycardic.  Current symptoms most likely related to radiation treatment/esophagitis.  She does have recent history of leukopenia but thankfully denies fevers, chills, or night sweats.  Minimal abdominal pain at this time.  Differential diagnosis would include esophagitis, gastritis, cholecystitis, pancreatitis, SBO, or atypical anginal pain. Workup revealed persistent leukopenia on CBC, though marginally improved.  CMP with slightly elevated glucose, otherwise within normal limits.  No suggestion of ongoing biliary pathology.  Lipase within normal limits.  EKG without signs of coronary ischemia.  Plain films of the abdomen and chest without acute findings.  Given negative workup, stable labs and to believe that most of the patient's nausea and vomiting is likely due due to anticipated side effects of chemoradiation treatment.  She does appear to be volume down, will treat with Phenergan  IV, Benadryl , Reglan , 1 L normal saline, and reassess her symptoms following.   Upon reassessment, patient resting comfortably.  She says her nausea has improved with the antiemetic  regimen.  We will see if she is able to tolerate p.o., and if so we will discharge home with follow-up to PCP.  Upon reassessment, patient able to tolerate p.o.  She did regurgitate liquid of water but states that she otherwise feels much better, ready to go home.  Will discharge with Reglan  and close PCP follow-up.         Final Clinical Impression(s) / ED Diagnoses Final diagnoses:  Nausea and vomiting, unspecified vomiting type    Rx / DC Orders ED Discharge Orders          Ordered    metoCLOPramide  (REGLAN ) 10 MG tablet  3 times daily with meals        06/25/23 1927              Sheree Dieter, MD 06/25/23 1934    Dalene Duck, MD 06/30/23 [phone]

## 2023-06-25 NOTE — Telephone Encounter (Signed)
"   Vomiting every 15 mins". She thinks she might be dehydrated . If she eats or drinks and swallows anything she vomits. She said the vomiting has been going on for "2 weeks".  zofran  and compazine   - not helping.  She endorses weakness ,dizziness,night sweats She almost passed out in the kitchen this am.   Last chemo and radiation 8-10 days ago.    I instructed her daughter to take pt to ED. Pt is willing to go and she thinks she can get in the car with help.

## 2023-06-29 ENCOUNTER — Telehealth: Payer: Self-pay

## 2023-06-29 NOTE — Transitions of Care (Post Inpatient/ED Visit) (Unsigned)
   06/29/2023  Name: Wendy [name] MRN: [id number] DOB: Jan 03, 1946  Today's TOC FU Call Status: Today's TOC FU Call Status:: Unsuccessful Call (1st Attempt) Unsuccessful Call (1st Attempt) Date: 06/29/23  Attempted to reach the patient regarding the most recent Inpatient/ED visit.  Follow Up Plan: Additional outreach attempts will be made to reach the patient to complete the Transitions of Care (Post Inpatient/ED visit) call.   Signature Darrall Ellison, LPN [facility] Nurse Health Advisor Direct Dial [phone]

## 2023-06-30 NOTE — Transitions of Care (Post Inpatient/ED Visit) (Unsigned)
   06/30/2023  Name: Wendy [name] MRN: [id number] DOB: [birth year]  Today's TOC FU Call Status: Today's TOC FU Call Status:: Unsuccessful Call (2nd Attempt) Unsuccessful Call (1st Attempt) Date: 06/29/23 Unsuccessful Call (2nd Attempt) Date: 06/30/23  Attempted to reach the patient regarding the most recent Inpatient/ED visit.  Follow Up Plan: Additional outreach attempts will be made to reach the patient to complete the Transitions of Care (Post Inpatient/ED visit) call.   Signature Darrall Ellison, LPN [facility] Nurse Health Advisor Direct Dial [phone]

## 2023-07-01 NOTE — Transitions of Care (Post Inpatient/ED Visit) (Signed)
   07/01/2023  Name: Wendy [name] MRN: [id number] DOB: Jan 25, 1946  Today's TOC FU Call Status: Today's TOC FU Call Status:: Unsuccessful Call (3rd Attempt) Unsuccessful Call (1st Attempt) Date: 06/29/23 Unsuccessful Call (2nd Attempt) Date: 06/30/23 Unsuccessful Call (3rd Attempt) Date: 07/01/23  Attempted to reach the patient regarding the most recent Inpatient/ED visit.  Follow Up Plan: No further outreach attempts will be made at this time. We have been unable to contact the patient.  Signature Darrall Ellison, LPN [facility] Nurse Health Advisor Direct Dial [phone]

## 2023-07-03 ENCOUNTER — Ambulatory Visit (HOSPITAL_COMMUNITY)
Admission: RE | Admit: 2023-07-03 | Discharge: 2023-07-03 | Disposition: A | Discharge status: Home / Self Care | Source: Ambulatory Visit | Attending: Internal Medicine | Admitting: Internal Medicine

## 2023-07-03 ENCOUNTER — Inpatient Hospital Stay: Attending: Internal Medicine

## 2023-07-03 DIAGNOSIS — M069 Rheumatoid arthritis, unspecified: Secondary | ICD-10-CM | POA: Insufficient documentation

## 2023-07-03 DIAGNOSIS — E119 Type 2 diabetes mellitus without complications: Secondary | ICD-10-CM | POA: Diagnosis not present

## 2023-07-03 DIAGNOSIS — Z79620 Long term (current) use of immunosuppressive biologic: Secondary | ICD-10-CM | POA: Insufficient documentation

## 2023-07-03 DIAGNOSIS — R591 Generalized enlarged lymph nodes: Secondary | ICD-10-CM | POA: Insufficient documentation

## 2023-07-03 DIAGNOSIS — Z885 Allergy status to narcotic agent status: Secondary | ICD-10-CM | POA: Insufficient documentation

## 2023-07-03 DIAGNOSIS — Z5112 Encounter for antineoplastic immunotherapy: Secondary | ICD-10-CM | POA: Insufficient documentation

## 2023-07-03 DIAGNOSIS — R1310 Dysphagia, unspecified: Secondary | ICD-10-CM | POA: Insufficient documentation

## 2023-07-03 DIAGNOSIS — Z886 Allergy status to analgesic agent status: Secondary | ICD-10-CM | POA: Insufficient documentation

## 2023-07-03 DIAGNOSIS — I2510 Atherosclerotic heart disease of native coronary artery without angina pectoris: Secondary | ICD-10-CM | POA: Insufficient documentation

## 2023-07-03 DIAGNOSIS — R531 Weakness: Secondary | ICD-10-CM | POA: Insufficient documentation

## 2023-07-03 DIAGNOSIS — C3490 Malignant neoplasm of unspecified part of unspecified bronchus or lung: Secondary | ICD-10-CM | POA: Insufficient documentation

## 2023-07-03 DIAGNOSIS — C3431 Malignant neoplasm of lower lobe, right bronchus or lung: Secondary | ICD-10-CM | POA: Insufficient documentation

## 2023-07-03 DIAGNOSIS — Z923 Personal history of irradiation: Secondary | ICD-10-CM | POA: Diagnosis not present

## 2023-07-03 DIAGNOSIS — J439 Emphysema, unspecified: Secondary | ICD-10-CM | POA: Diagnosis not present

## 2023-07-03 DIAGNOSIS — I700 Atherosclerosis of aorta: Secondary | ICD-10-CM | POA: Insufficient documentation

## 2023-07-03 DIAGNOSIS — Z79899 Other long term (current) drug therapy: Secondary | ICD-10-CM | POA: Insufficient documentation

## 2023-07-03 DIAGNOSIS — R5383 Other fatigue: Secondary | ICD-10-CM | POA: Diagnosis not present

## 2023-07-03 DIAGNOSIS — I10 Essential (primary) hypertension: Secondary | ICD-10-CM | POA: Diagnosis not present

## 2023-07-03 DIAGNOSIS — C779 Secondary and unspecified malignant neoplasm of lymph node, unspecified: Secondary | ICD-10-CM | POA: Diagnosis not present

## 2023-07-03 DIAGNOSIS — Z7984 Long term (current) use of oral hypoglycemic drugs: Secondary | ICD-10-CM | POA: Diagnosis not present

## 2023-07-03 DIAGNOSIS — Z9221 Personal history of antineoplastic chemotherapy: Secondary | ICD-10-CM | POA: Insufficient documentation

## 2023-07-03 LAB — CBC WITH DIFFERENTIAL (CANCER CENTER ONLY)
Abs Immature Granulocytes: 0.08 10*3/uL — ABNORMAL HIGH (ref 0.00–0.07)
Basophils Absolute: 0 10*3/uL (ref 0.0–0.1)
Basophils Relative: 0 %
Eosinophils Absolute: 0 10*3/uL (ref 0.0–0.5)
Eosinophils Relative: 0 %
HCT: 30.2 % — ABNORMAL LOW (ref 36.0–46.0)
Hemoglobin: 9.7 g/dL — ABNORMAL LOW (ref 12.0–15.0)
Immature Granulocytes: 2 %
Lymphocytes Relative: 17 %
Lymphs Abs: 0.8 10*3/uL (ref 0.7–4.0)
MCH: 26.7 pg (ref 26.0–34.0)
MCHC: 32.1 g/dL (ref 30.0–36.0)
MCV: 83.2 fL (ref 80.0–100.0)
Monocytes Absolute: 1 10*3/uL (ref 0.1–1.0)
Monocytes Relative: 22 %
Neutro Abs: 2.6 10*3/uL (ref 1.7–7.7)
Neutrophils Relative %: 59 %
Platelet Count: 220 10*3/uL (ref 150–400)
RBC: 3.63 MIL/uL — ABNORMAL LOW (ref 3.87–5.11)
RDW: 23.5 % — ABNORMAL HIGH (ref 11.5–15.5)
WBC Count: 4.5 10*3/uL (ref 4.0–10.5)
nRBC: 0 % (ref 0.0–0.2)

## 2023-07-03 LAB — CMP (CANCER CENTER ONLY)
ALT: 8 U/L (ref 0–44)
AST: 15 U/L (ref 15–41)
Albumin: 3.4 g/dL — ABNORMAL LOW (ref 3.5–5.0)
Alkaline Phosphatase: 71 U/L (ref 38–126)
Anion gap: 5 (ref 5–15)
BUN: 11 mg/dL (ref 8–23)
CO2: 31 mmol/L (ref 22–32)
Calcium: 8.4 mg/dL — ABNORMAL LOW (ref 8.9–10.3)
Chloride: 104 mmol/L (ref 98–111)
Creatinine: 0.78 mg/dL (ref 0.44–1.00)
GFR, Estimated: >60 mL/min (ref >60)
Glucose, Bld: 278 mg/dL — ABNORMAL HIGH (ref 70–99)
Potassium: 3.9 mmol/L (ref 3.5–5.1)
Sodium: 140 mmol/L (ref 135–145)
Total Bilirubin: 0.8 mg/dL (ref 0.0–1.2)
Total Protein: 6.8 g/dL (ref 6.5–8.1)

## 2023-07-03 MED ORDER — IOHEXOL 300 MG/ML  SOLN
75.0000 mL | Freq: Once | INTRAMUSCULAR | Status: AC | PRN
  Administered 2023-07-03: 75 mL via INTRAVENOUS

## 2023-07-03 MED ORDER — SODIUM CHLORIDE (PF) 0.9 % IJ SOLN
INTRAMUSCULAR | Status: AC
  Filled 2023-07-03: qty 50

## 2023-07-15 ENCOUNTER — Ambulatory Visit: Admitting: Orthopedic Surgery

## 2023-07-15 ENCOUNTER — Other Ambulatory Visit (INDEPENDENT_AMBULATORY_CARE_PROVIDER_SITE_OTHER): Payer: Self-pay

## 2023-07-15 ENCOUNTER — Ambulatory Visit: Payer: Medicare HMO | Admitting: Orthopedic Surgery

## 2023-07-15 ENCOUNTER — Inpatient Hospital Stay (HOSPITAL_BASED_OUTPATIENT_CLINIC_OR_DEPARTMENT_OTHER): Admitting: Internal Medicine

## 2023-07-15 VITALS — BP 150/93 | HR 104 | Temp 97.3°F | Resp 16 | Ht 63.0 in | Wt 175.9 lb

## 2023-07-15 DIAGNOSIS — C3431 Malignant neoplasm of lower lobe, right bronchus or lung: Secondary | ICD-10-CM | POA: Diagnosis not present

## 2023-07-15 DIAGNOSIS — M069 Rheumatoid arthritis, unspecified: Secondary | ICD-10-CM | POA: Diagnosis not present

## 2023-07-15 DIAGNOSIS — Z79620 Long term (current) use of immunosuppressive biologic: Secondary | ICD-10-CM | POA: Diagnosis not present

## 2023-07-15 DIAGNOSIS — I700 Atherosclerosis of aorta: Secondary | ICD-10-CM | POA: Diagnosis not present

## 2023-07-15 DIAGNOSIS — Z5112 Encounter for antineoplastic immunotherapy: Secondary | ICD-10-CM | POA: Diagnosis not present

## 2023-07-15 DIAGNOSIS — Z981 Arthrodesis status: Secondary | ICD-10-CM | POA: Diagnosis not present

## 2023-07-15 DIAGNOSIS — E119 Type 2 diabetes mellitus without complications: Secondary | ICD-10-CM | POA: Diagnosis not present

## 2023-07-15 DIAGNOSIS — R531 Weakness: Secondary | ICD-10-CM | POA: Diagnosis not present

## 2023-07-15 DIAGNOSIS — R591 Generalized enlarged lymph nodes: Secondary | ICD-10-CM | POA: Diagnosis not present

## 2023-07-15 DIAGNOSIS — I10 Essential (primary) hypertension: Secondary | ICD-10-CM | POA: Diagnosis not present

## 2023-07-15 NOTE — Progress Notes (Signed)
 Orthopedic Surgery Office Visit   Procedure: L2/3 and L3/4 XLIF, L1-4 laminectomies, T10-pelvis PSIF Date of Surgery: 07/29/2022 (~12 months post-op)   Assessment: Patient is a 78 y.o. who is doing well after surgery     Plan: -No spine specific restrictions -Activity as tolerated -Pain control: OTC medications -Return to office in 6 months, x-rays needed at next visit: AP/lateral/flex/ex lumbar   ___________________________________________________________________________     Subjective: Patient has been doing well since she was last seen in the office.  She is not having any back or radiating leg pain.  She has completed her chemotherapy and her radiation.  She is being switched over to immunotherapy.  She is pleased with her surgical outcome at this point. Ambulating with cane. Feels she is standing up straighter.    Objective:   General: no acute distress, appropriate affect Neurologic: alert, answering questions appropriately, following commands Respiratory: unlabored breathing on room air Skin: incisions are well healed   MSK (spine):   -Strength exam                                                   Left                  Right   EHL                              5/5                  5/5 TA                                 5/5                  5/5 GSC                             5/5                  5/5 Knee extension            5/5                  5/5 Hip flexion                    4/5                  5/5   -Sensory exam                           Sensation intact to light touch in L3-S1 nerve distributions of bilateral lower extremities (decreased in left anterior thigh)    Imaging: XRs of the lumbar spine taken 07/15/2023 were independently reviewed and interpreted, showing posterior instrumentation from T10 to the pelvis.  No lucency seen around the screws.  None of the screws have backed out.  No kyphosis seen above her long fusion construct.  No translation seen on  her flexion/extension views.  Lateral interbody devices are located at L2/3 and L3/4 in unchanged position.  There are posterior interbody devices at L4/5 and L5/S1 that appear in appropriate position.  No fracture  or dislocation seen.     Patient name: Wendy [name] Patient MRN: [id number] Date of visit: 07/15/23

## 2023-07-15 NOTE — Progress Notes (Signed)
 DISCONTINUE ON PATHWAY REGIMEN - Non-Small Cell Lung     A cycle is every 7 days, concurrent with RT:     Paclitaxel       Carboplatin    **Always confirm dose/schedule in your pharmacy ordering system**  REASON: Continuation Of Treatment PRIOR TREATMENT: [id number]: Carboplatin  AUC=2 + Paclitaxel  45 mg/m2 Weekly During Radiation TREATMENT RESPONSE: Partial Response (PR)  START ON PATHWAY REGIMEN - Non-Small Cell Lung     A cycle is every 28 days:     Durvalumab   **Always confirm dose/schedule in your pharmacy ordering system**  Patient Characteristics: Preoperative or Nonsurgical Candidate (Clinical Staging), Stage IIB (N2a only) or Stage III - Nonsurgical Candidate, PS = 0,1 Therapeutic Status: Preoperative or Nonsurgical Candidate (Clinical Staging) AJCC T Category: cT1c AJCC N Category: cN2b AJCC M Category: cM0 AJCC 9 Stage Grouping: IIIA Check here if patient was staged using an edition other than AJCC Staging 9th Edition: true ECOG Performance Status: 1 Intent of Therapy: Curative Intent, Discussed with Patient

## 2023-07-15 NOTE — Progress Notes (Signed)
 [facility] [phone]   [phone]  OFFICE PROGRESS NOTE  Donley Furth, MD [address] [place] [address]  DIAGNOSIS:   1) Stage IIIa (T1c, N2, M0) non-small cell lung cancer, adenocarcinoma presented with right lower lobe lung nodule in addition to right paratracheal lymphadenopathy diagnosed in February 2025.  Molecular studies by [id number] showed no actionable mutations and negative PD-L1 expression.   2) history of rheumatoid arthritis and currently on treatment with Plaquenil .  PRIOR THERAPY: A course of concurrent chemoradiation with weekly carboplatin  for AUC of 2 and paclitaxel  45 Mg/M2.  First dose May 04, 2023.  Status post 7 cycles.  Last dose was given on 06/15/2023.  CURRENT THERAPY:   INTERVAL HISTORY: Wendy [name] 78 y.o. female returns to the clinic today for follow-up visit accompanied by her granddaughter.  Discussed the use of AI scribe software for clinical note transcription with the patient, who gave verbal consent to proceed.  History of Present Illness   Wendy [name] is a 78 year old female with stage three non-small cell lung cancer who presents for evaluation with repeat CT scan for restaging of her disease. She is accompanied by her granddaughter.  Diagnosed with stage three non-small cell lung cancer, adenocarcinoma, in February 2025, presenting with a right lower lobe lung nodule and right paratracheal lymphadenopathy. Completed a course of concurrent chemoradiation with weekly carboplatin  approximately one month ago. Feels 'pretty good' overall, with improvement in ability to eat solid foods such as steak and lamb, and swallowing is getting better. No chest pain, shortness of breath, or coughing, but still experiences some fluid regurgitation, though it is less severe than before.  Has a history of rheumatoid arthritis and is currently on Plaquenil . Legs have felt weak, attributed to the  effects of radiation, but notes improvement with increased walking and exercise. Eating better recently, contributing to overall improvement. No significant pain or flare-ups of rheumatoid arthritis at this time.       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 Stefan Edge     ALLERGIES:  is allergic to aspirin and tramadol.  MEDICATIONS:  Current Outpatient Medications  Medication Sig Dispense Refill   ACCU-CHEK GUIDE TEST test strip USE TO TEST ONCE DAILY 100 strip 1   Accu-Chek Softclix Lancets lancets Test once per day and diagnosis code is E 11.9 and dispense for (ACCU-CHEK Soft Touch) 100 each 12   ALPRAZolam  (XANAX ) 1 MG tablet TAKE 1 TABLET (1 MG TOTAL) BY MOUTH AT BEDTIME AS NEEDED FOR ANXIETY OR SLEEP. 30 tablet 5   amLODipine  (NORVASC ) 10 MG tablet Take 1 tablet (10 mg total) by mouth  daily. 90 tablet 3   atorvastatin  (LIPITOR) 10 MG tablet TAKE 1 TABLET BY MOUTH EVERY DAY 90 tablet 3   Blood Glucose Monitoring Suppl (ACCU-CHEK GUIDE) w/Device KIT Use to test sugars daily. 1 kit 0   Blood Glucose Monitoring Suppl DEVI 1 each by Does not apply route in the morning, at noon, and at bedtime. May substitute to any manufacturer covered by patient's insurance. 1 each 0   Blood Pressure Monitor KIT Use to check blood pressure. 1 kit 0   Cholecalciferol (VITAMIN D3)  20 MCG (800 UNIT) TABS Take 800 Units by mouth daily.     DULoxetine  (CYMBALTA ) 30 MG capsule Take 1 capsule (30 mg total) by mouth at bedtime.     fluticasone  (FLONASE ) 50 MCG/ACT nasal spray SPRAY 2 SPRAYS INTO EACH NOSTRIL EVERY DAY 48 mL 3   furosemide  (LASIX ) 20 MG tablet Take 1 tablet (20 mg total) by mouth daily as needed (fluid retention). 90 tablet 3   Ginkgo Biloba (GNP GINGKO BILOBA EXTRACT PO) Take by mouth.     glipiZIDE  (GLUCOTROL ) 10 MG tablet TAKE 1 TABLET (10 MG TOTAL) BY MOUTH TWICE A DAY BEFORE A MEAL 180 tablet 0   Glucose Blood (BLOOD GLUCOSE TEST STRIPS) STRP 1 each by In Vitro route in the morning, at noon, and at bedtime. May substitute to any manufacturer covered by patient's insurance. 100 each 3   HYDROcodone -acetaminophen  (NORCO) 10-325 MG tablet Take 1 tablet by mouth every 6 (six) hours as needed for moderate pain (pain score 4-6). 120 tablet 0   HYDROcodone -acetaminophen  (NORCO) 10-325 MG tablet Take 1 tablet by mouth every 6 (six) hours as needed for moderate pain (pain score 4-6). 120 tablet 0   HYDROcodone -acetaminophen  (NORCO) 10-325 MG tablet Take 1 tablet by mouth every 6 (six) hours as needed for moderate pain (pain score 4-6). 120 tablet 0   hydroxychloroquine  (PLAQUENIL ) 200 MG tablet Take 1 tablet (200 mg total) by mouth daily. 90 tablet 1   insulin  glargine (LANTUS ) 100 UNIT/ML Solostar Pen Inject 25 Units into the skin daily. 15 mL 4   Insulin  Pen Needle 32G X 4 MM MISC Use 4x a day 300 each 3   Lancets (ACCU-CHEK SOFT TOUCH) lancets Test once per day and diagnosis code is E 11.9 and dispense for Aviva plus 100 each 3   levocetirizine (XYZAL ) 5 MG tablet TAKE 1 TABLET BY MOUTH EVERY DAY IN THE EVENING 90 tablet 3   levocetirizine (XYZAL ) 5 MG tablet Take 1 tablet (5 mg total) by mouth every evening. 90 tablet 3   lidocaine -prilocaine  (EMLA ) cream Apply to affected area once 30 g 3   metFORMIN  (GLUCOPHAGE ) 500 MG tablet Take 1 tablet (500 mg total) by mouth  2 (two) times daily with a meal. 180 tablet 3   metoCLOPramide  (REGLAN ) 10 MG tablet Take 1 tablet (10 mg total) by mouth 3 (three) times daily with meals for 5 days. 15 tablet 0   mupirocin  ointment (BACTROBAN ) 2 % Apply 1 Application topically 2 (two) times daily. 22 g 5   omeprazole  (PRILOSEC) 40 MG capsule TAKE 1 CAPSULE BY MOUTH EVERY DAY 90 capsule 3   ondansetron  (ZOFRAN ) 4 MG tablet Take 1 tablet (4 mg total) by mouth every 8 (eight) hours as needed for nausea or vomiting. 90 tablet 3   ondansetron  (ZOFRAN ) 8 MG tablet Take 1 tablet (8 mg total) by mouth every 8 (eight) hours as needed for nausea or vomiting. Start on the third day  after chemotherapy. 30 tablet 1   polyethylene glycol (MIRALAX  / GLYCOLAX ) 17 g packet Take 17 g by mouth daily as needed for mild constipation.     potassium chloride  (KLOR-CON ) 10 MEQ tablet Take 1 tablet (10 mEq total) by mouth daily. 90 tablet 3   pregabalin  (LYRICA ) 100 MG capsule Take 1 capsule (100 mg total) by mouth at bedtime. 90 capsule 1   prochlorperazine  (COMPAZINE ) 10 MG tablet Take 1 tablet (10 mg total) by mouth every 6 (six) hours as needed for nausea or vomiting. 30 tablet 1   RESTASIS  0.05 % ophthalmic emulsion Place 1 drop into both eyes 2 (two) times daily.     sitaGLIPtin  (JANUVIA ) 100 MG tablet Take 1 tablet (100 mg total) by mouth at bedtime. 90 tablet 0   sucralfate  (CARAFATE ) 1 g tablet Take 1 tablet (1 g total) by mouth 4 (four) times daily. Dissolve each tablet in 15 cc water before use. 120 tablet 2   No current facility-administered medications for this visit.    SURGICAL HISTORY:  Past Surgical History:  Procedure Laterality Date   BRONCHIAL BRUSHINGS  04/07/2023   Procedure: BRONCHIAL BRUSHINGS;  Surgeon: Denson Flake, MD;  Location: [facility] ENDOSCOPY;  Service: Pulmonary;;   BRONCHIAL NEEDLE ASPIRATION BIOPSY  04/07/2023   Procedure: BRONCHIAL NEEDLE ASPIRATION BIOPSIES;  Surgeon: Denson Flake, MD;  Location: [facility] ENDOSCOPY;   Service: Pulmonary;;   CATARACT EXTRACTION     COLONOSCOPY  11/23/2019   per Dr. Howard Macho, adenmatous polyp, repeat in 7 yrs   ESOPHAGOGASTRODUODENOSCOPY  06/20/2008   at [facility], clear    FINE NEEDLE ASPIRATION  04/07/2023   Procedure: FINE NEEDLE ASPIRATION (FNA) LINEAR;  Surgeon: Denson Flake, MD;  Location: [facility] ENDOSCOPY;  Service: Pulmonary;;   IR IMAGING GUIDED PORT INSERTION  04/30/2023   LUMBAR LAMINECTOMY     1995   ROTATOR CUFF REPAIR Right    SPINAL FUSION N/A 07/29/2022   Procedure: T10-PELVIS INSTRUMENTATION/ T10-L4 POSTERIOR SPINAL FUSION;  Surgeon: Diedra Fowler, MD;  Location: [facility] OR;  Service: Orthopedics;  Laterality: N/A;   UPPER GASTROINTESTINAL ENDOSCOPY  2010   [facility]    VIDEO BRONCHOSCOPY WITH ENDOBRONCHIAL ULTRASOUND N/A 04/07/2023   Procedure: VIDEO BRONCHOSCOPY WITH ENDOBRONCHIAL ULTRASOUND;  Surgeon: Denson Flake, MD;  Location: [facility] ENDOSCOPY;  Service: Pulmonary;  Laterality: N/A;    REVIEW OF SYSTEMS:  Constitutional: positive for fatigue Eyes: negative Ears, nose, mouth, throat, and face: negative Respiratory: negative Cardiovascular: negative Gastrointestinal: positive for dysphagia Genitourinary:negative Integument/breast: negative Hematologic/lymphatic: negative Musculoskeletal:negative Neurological: negative Behavioral/Psych: negative Endocrine: negative Allergic/Immunologic: negative   PHYSICAL EXAMINATION: General appearance: alert, cooperative, fatigued, and no distress Head: Normocephalic, without obvious abnormality, atraumatic Neck: no adenopathy, no JVD, supple, symmetrical, trachea midline, and thyroid  not enlarged, symmetric, no tenderness/mass/nodules Lymph nodes: Cervical, supraclavicular, and axillary nodes normal. Resp: clear to auscultation bilaterally Back: symmetric, no curvature. ROM normal. No CVA tenderness. Cardio: regular rate and rhythm, S1, S2 normal, no murmur, click, rub or gallop GI: soft, non-tender; bowel  sounds normal; no masses,  no organomegaly Extremities: extremities normal, atraumatic, no cyanosis or edema Neurologic: Alert and oriented X 3, normal strength and tone. Normal symmetric reflexes. Normal coordination and gait  ECOG PERFORMANCE STATUS: 1 - Symptomatic but completely ambulatory  Blood pressure (!) 150/93, pulse (!) 104, temperature (!) 97.3 F (36.3 C), temperature source Temporal, resp. rate 16, height 5\' 3"  (1.6 m), weight 175 lb 14.4 oz (79.8 kg), SpO2 98%.  LABORATORY DATA: Lab  Results  Component Value Date   WBC 4.5 07/03/2023   HGB 9.7 (L) 07/03/2023   HCT 30.2 (L) 07/03/2023   MCV 83.2 07/03/2023   PLT 220 07/03/2023      Chemistry      Component Value Date/Time   NA 140 07/03/2023 1103   K 3.9 07/03/2023 1103   CL 104 07/03/2023 1103   CO2 31 07/03/2023 1103   BUN 11 07/03/2023 1103   CREATININE 0.78 07/03/2023 1103   CREATININE 1.07 (H) 09/16/2019 1511      Component Value Date/Time   CALCIUM  8.4 (L) 07/03/2023 1103   ALKPHOS 71 07/03/2023 1103   AST 15 07/03/2023 1103   ALT 8 07/03/2023 1103   BILITOT 0.8 07/03/2023 1103       RADIOGRAPHIC STUDIES: CT Chest W Contrast Result Date: 07/14/2023 CLINICAL DATA:  Follow-up non-small cell lung cancer EXAM: CT CHEST WITH CONTRAST TECHNIQUE: Multidetector CT imaging of the chest was performed during intravenous contrast administration. RADIATION DOSE REDUCTION: This exam was performed according to the departmental dose-optimization program which includes automated exposure control, adjustment of the mA and/or kV according to patient size and/or use of iterative reconstruction technique. CONTRAST:  75mL OMNIPAQUE  IOHEXOL  300 MG/ML  SOLN COMPARISON:  04/01/2023 FINDINGS: Cardiovascular: The heart is normal in size. No pericardial effusion. No evidence of thoracic aortic aneurysm. Atherosclerotic calcifications of the aortic arch. Moderate three-vessel coronary atherosclerosis. Right chest port terminates at  the cavoatrial junction. Mediastinum/Nodes: 16 mm short axis low right paratracheal node (image 47), suspicious for nodal metastasis, grossly unchanged. Lungs/Pleura: 1.6 x 1.4 cm irregular right lower lobe nodule (image 55), previously 2.7 x 2.3 cm, corresponding to the patient's known primary bronchogenic carcinoma. Mild centrilobular and paraseptal emphysematous upper lung predominant. No focal consolidation. Mild scarring/atelectasis in the lingula bilateral lower lobes. No pleural effusion or pneumothorax. Upper Abdomen: Vascular calcifications. Musculoskeletal: Degenerative changes of the visualized thoracolumbar spine. Lumbar spine fixation hardware, incompletely visualized. IMPRESSION: 1.6 cm irregular right lower lobe nodule, corresponding to the patient's known primary bronchogenic carcinoma, improved. Stable right paratracheal nodal metastasis. Aortic Atherosclerosis (ICD10-I70.0) and Emphysema (ICD10-J43.9). Electronically Signed   By: Zadie Herter M.D.   On: 07/14/2023 00:44   DG ABD ACUTE 2+V W 1V CHEST Result Date: 06/25/2023 CLINICAL DATA:  ab pain, vomiting EXAM: DG ABDOMEN ACUTE WITH 1 VIEW CHEST COMPARISON:  April 07, 2023 FINDINGS: The study was significantly degraded by patient's body habitus. No findings to suggest small bowel obstruction. No pneumoperitoneum. No organomegaly or radiopaque calculi. No acute fracture or destructive lesion. Extensive thoracolumbar fusion hardware with bilateral sacroiliac fixation. No cardiomegaly. Right chest port terminates in the lower SVC. No focal airspace consolidation, pleural effusion, or pneumothorax. IMPRESSION: The study was significantly degraded by patient's body habitus. Otherwise, no findings to suggest small bowel obstruction. Electronically Signed   By: Rance Burrows M.D.   On: 06/25/2023 18:24     ASSESSMENT AND PLAN: This is a very pleasant 78 years old African-American female with Stage IIIa (T1c, N2, M0) non-small cell lung  cancer, adenocarcinoma presented with right lower lobe lung nodule in addition to right paratracheal lymphadenopathy diagnosed in February 2025. Molecular studies by [id number] showed no actionable mutations and negative PD-L1 expression. She also has a history of rheumatoid arthritis and currently on treatment with Plaquenil . She underwent a course of concurrent chemoradiation with weekly carboplatin  for AUC of 2 and paclitaxel  45 Mg/M2.  First dose May 04, 2023.  States post 7 cycles.  Last cycle  was given on 06/15/2023. The patient continues to tolerate this treatment fairly well except for odynophagia. She had repeat CT scan of the chest performed recently.  I personally independently reviewed the scan images and discussed the result and showed the images to the patient today. Her scan showed improvement of her disease with decrease in the size of the right lower lobe mass. Assessment and Plan    Non-small cell lung cancer, stage 3 Stage 3 non-small cell lung cancer, adenocarcinoma, with a right lower lobe lung nodule and right paratracheal lymphadenopathy. Diagnosed in February 2025. Underwent concurrent chemoradiation with weekly carboplatin . Recent CT scan shows significant improvement in the right lower lobe lung mass, indicating a good response to treatment. The right paratracheal lymphadenopathy is stable. Immunotherapy with durvalumab was discussed as a consolidation therapy, but there is a risk of exacerbating rheumatoid arthritis. After discussing the risks and benefits, she opted to proceed with immunotherapy despite the potential for rheumatoid arthritis flare. She understands that durvalumab can cause inflammation in any part of the body, potentially worsening her rheumatoid arthritis. - Initiate durvalumab (Imfinzi) infusion every four weeks - Monitor for symptoms of rheumatoid arthritis flare - Refer to a rheumatologist for management of potential rheumatoid arthritis  exacerbation  Rheumatoid arthritis Rheumatoid arthritis managed with Plaquenil . She is aware of the potential for immunotherapy to exacerbate rheumatoid arthritis symptoms and is willing to accept the risk of flare to pursue cancer treatment. She does not currently have a rheumatologist and needs a referral for ongoing management. - Continue Plaquenil  - Refer to a rheumatologist for ongoing management   She was advised to call immediately if she has any other concerning symptoms in the interval. The patient voices understanding of current disease status and treatment options and is in agreement with the current care plan.  All questions were answered. The patient knows to call the clinic with any problems, questions or concerns. We can certainly see the patient much sooner if necessary.  The total time spent in the appointment was 30 minutes.  Disclaimer: This note was dictated with voice recognition software. Similar sounding words can inadvertently be transcribed and may not be corrected upon review.

## 2023-07-16 ENCOUNTER — Telehealth: Payer: Self-pay | Admitting: Internal Medicine

## 2023-07-16 NOTE — Telephone Encounter (Signed)
 Left the patient a voicemail with the appointment details. The patient is also active on myChart.

## 2023-07-17 NOTE — Progress Notes (Signed)
  Radiation Oncology         [phone] ________________________________  Name: Wendy [name] MRN: [id number]  Date of Service: 07/17/2023  DOB: 1945-05-31  Post Treatment Telephone Note  Diagnosis:  Stage IIIA, cT1cN2M0, NSCLC, adenocarcinoma of the RLL (as documented in provider EOT note)  The patient was available for call today.   Symptoms of fatigue have improved since completing therapy.  Symptoms of skin changes have improved since completing therapy.  Symptoms of esophagitis have improved since completing therapy.  The patient has scheduled follow up with her medical oncologist Dr. Marguerita Shih for ongoing care, and was encouraged to call if she develops concerns or questions regarding radiation.  This concludes the interaction.  Avery Bodo, LPN

## 2023-07-20 ENCOUNTER — Ambulatory Visit
Admission: RE | Admit: 2023-07-20 | Discharge: 2023-07-20 | Disposition: A | Discharge status: Home / Self Care | Source: Ambulatory Visit | Attending: Internal Medicine | Admitting: Internal Medicine

## 2023-07-21 ENCOUNTER — Inpatient Hospital Stay

## 2023-07-21 VITALS — BP 120/80 | HR 98 | Temp 98.0°F | Resp 16

## 2023-07-21 DIAGNOSIS — C3431 Malignant neoplasm of lower lobe, right bronchus or lung: Secondary | ICD-10-CM | POA: Diagnosis not present

## 2023-07-21 DIAGNOSIS — I10 Essential (primary) hypertension: Secondary | ICD-10-CM | POA: Diagnosis not present

## 2023-07-21 DIAGNOSIS — M069 Rheumatoid arthritis, unspecified: Secondary | ICD-10-CM | POA: Diagnosis not present

## 2023-07-21 DIAGNOSIS — Z79620 Long term (current) use of immunosuppressive biologic: Secondary | ICD-10-CM | POA: Diagnosis not present

## 2023-07-21 DIAGNOSIS — I700 Atherosclerosis of aorta: Secondary | ICD-10-CM | POA: Diagnosis not present

## 2023-07-21 DIAGNOSIS — R591 Generalized enlarged lymph nodes: Secondary | ICD-10-CM | POA: Diagnosis not present

## 2023-07-21 DIAGNOSIS — E119 Type 2 diabetes mellitus without complications: Secondary | ICD-10-CM | POA: Diagnosis not present

## 2023-07-21 DIAGNOSIS — Z95828 Presence of other vascular implants and grafts: Secondary | ICD-10-CM

## 2023-07-21 DIAGNOSIS — Z5112 Encounter for antineoplastic immunotherapy: Secondary | ICD-10-CM | POA: Diagnosis not present

## 2023-07-21 DIAGNOSIS — R531 Weakness: Secondary | ICD-10-CM | POA: Diagnosis not present

## 2023-07-21 LAB — CMP (CANCER CENTER ONLY)
ALT: 9 U/L (ref 0–44)
AST: 19 U/L (ref 15–41)
Albumin: 3.3 g/dL — ABNORMAL LOW (ref 3.5–5.0)
Alkaline Phosphatase: 67 U/L (ref 38–126)
Anion gap: 7 (ref 5–15)
BUN: 17 mg/dL (ref 8–23)
CO2: 24 mmol/L (ref 22–32)
Calcium: 8.9 mg/dL (ref 8.9–10.3)
Chloride: 106 mmol/L (ref 98–111)
Creatinine: 1.06 mg/dL — ABNORMAL HIGH (ref 0.44–1.00)
GFR, Estimated: 54 mL/min — ABNORMAL LOW (ref >60)
Glucose, Bld: 58 mg/dL — ABNORMAL LOW (ref 70–99)
Potassium: 3.1 mmol/L — ABNORMAL LOW (ref 3.5–5.1)
Sodium: 137 mmol/L (ref 135–145)
Total Bilirubin: 0.8 mg/dL (ref 0.0–1.2)
Total Protein: 7.2 g/dL (ref 6.5–8.1)

## 2023-07-21 LAB — CBC WITH DIFFERENTIAL (CANCER CENTER ONLY)
Abs Immature Granulocytes: 0.06 10*3/uL (ref 0.00–0.07)
Basophils Absolute: 0 10*3/uL (ref 0.0–0.1)
Basophils Relative: 1 %
Eosinophils Absolute: 0 10*3/uL (ref 0.0–0.5)
Eosinophils Relative: 1 %
HCT: 30.4 % — ABNORMAL LOW (ref 36.0–46.0)
Hemoglobin: 9.5 g/dL — ABNORMAL LOW (ref 12.0–15.0)
Immature Granulocytes: 1 %
Lymphocytes Relative: 9 %
Lymphs Abs: 0.5 10*3/uL — ABNORMAL LOW (ref 0.7–4.0)
MCH: 26 pg (ref 26.0–34.0)
MCHC: 31.3 g/dL (ref 30.0–36.0)
MCV: 83.1 fL (ref 80.0–100.0)
Monocytes Absolute: 1 10*3/uL (ref 0.1–1.0)
Monocytes Relative: 19 %
Neutro Abs: 3.8 10*3/uL (ref 1.7–7.7)
Neutrophils Relative %: 69 %
Platelet Count: 258 10*3/uL (ref 150–400)
RBC: 3.66 MIL/uL — ABNORMAL LOW (ref 3.87–5.11)
RDW: 20.3 % — ABNORMAL HIGH (ref 11.5–15.5)
WBC Count: 5.5 10*3/uL (ref 4.0–10.5)
nRBC: 0 % (ref 0.0–0.2)

## 2023-07-21 LAB — TSH: TSH: 0.643 u[IU]/mL (ref 0.350–4.500)

## 2023-07-21 MED ORDER — SODIUM CHLORIDE 0.9 % IV SOLN
1500.0000 mg | Freq: Once | INTRAVENOUS | Status: AC
  Administered 2023-07-21: 1500 mg via INTRAVENOUS
  Filled 2023-07-21: qty 30

## 2023-07-21 MED ORDER — SODIUM CHLORIDE 0.9% FLUSH
10.0000 mL | Freq: Once | INTRAVENOUS | Status: AC
  Administered 2023-07-21: 10 mL

## 2023-07-21 MED ORDER — HEPARIN SOD (PORK) LOCK FLUSH 100 UNIT/ML IV SOLN
500.0000 [IU] | Freq: Once | INTRAVENOUS | Status: AC | PRN
  Administered 2023-07-21: 500 [IU]

## 2023-07-21 MED ORDER — SODIUM CHLORIDE 0.9 % IV SOLN: INTRAVENOUS | Status: DC

## 2023-07-21 MED ORDER — SODIUM CHLORIDE 0.9% FLUSH
10.0000 mL | INTRAVENOUS | Status: DC | PRN
  Administered 2023-07-21: 10 mL

## 2023-07-21 NOTE — Patient Instructions (Signed)
 [facility] - A DEPT OF [facility]  Discharge Instructions: Thank you for choosing [facility] to provide your oncology and hematology care.   If you have a lab appointment with the Cancer Center, please go directly to the Cancer Center and check in at the registration area.   Wear comfortable clothing and clothing appropriate for easy access to any Portacath or PICC line.   We strive to give you quality time with your provider. You may need to reschedule your appointment if you arrive late (15 or more minutes).  Arriving late affects you and other patients whose appointments are after yours.  Also, if you miss three or more appointments without notifying the office, you may be dismissed from the clinic at the provider's discretion.      For prescription refill requests, have your pharmacy contact our office and allow 72 hours for refills to be completed.    Today you received the following chemotherapy and/or immunotherapy agents :  durvalumab      To help prevent nausea and vomiting after your treatment, we encourage you to take your nausea medication as directed.  BELOW ARE SYMPTOMS THAT SHOULD BE REPORTED IMMEDIATELY: *FEVER GREATER THAN 100.4 F (38 C) OR HIGHER *CHILLS OR SWEATING *NAUSEA AND VOMITING THAT IS NOT CONTROLLED WITH YOUR NAUSEA MEDICATION *UNUSUAL SHORTNESS OF BREATH *UNUSUAL BRUISING OR BLEEDING *URINARY PROBLEMS (pain or burning when urinating, or frequent urination) *BOWEL PROBLEMS (unusual diarrhea, constipation, pain near the anus) TENDERNESS IN MOUTH AND THROAT WITH OR WITHOUT PRESENCE OF ULCERS (sore throat, sores in mouth, or a toothache) UNUSUAL RASH, SWELLING OR PAIN  UNUSUAL VAGINAL DISCHARGE OR ITCHING   Items with * indicate a potential emergency and should be followed up as soon as possible or go to the Emergency Department if any problems should occur.  Please show the CHEMOTHERAPY ALERT CARD or  IMMUNOTHERAPY ALERT CARD at check-in to the Emergency Department and triage nurse.  Should you have questions after your visit or need to cancel or reschedule your appointment, please contact [facility] - A DEPT OF [facility]  Dept: [phone]  and follow the prompts.  Office hours are 8:00 a.m. to 4:30 p.m. Monday - Friday. Please note that voicemails left after 4:00 p.m. may not be returned until the following business day.  We are closed weekends and major holidays. You have access to a nurse at all times for urgent questions. Please call the main number to the clinic Dept: [phone] and follow the prompts.   For any non-urgent questions, you may also contact your provider using MyChart. We now offer e-Visits for anyone 30 and older to request care online for non-urgent symptoms. For details visit [email or web address].   Also download the MyChart app! Go to the app store, search "MyChart", open the app, select [place], and log in with your MyChart username and password.

## 2023-07-22 ENCOUNTER — Ambulatory Visit: Payer: Self-pay | Admitting: *Deleted

## 2023-07-22 ENCOUNTER — Telehealth: Payer: Self-pay

## 2023-07-22 LAB — T4: T4, Total: 6.9 ug/dL (ref 4.5–12.0)

## 2023-07-22 NOTE — Telephone Encounter (Signed)
 Called pt & informed to eat foods hight in K+.  Suggested bananas, oranges/juice, potatoes, dried apricots, green leafy vegetables, yogurt.  She reports that she did well with her treatment yesterday although just tired.  She denies any other problems.

## 2023-07-22 NOTE — Telephone Encounter (Signed)
-----   Message from Nurse Ladena Picking sent at 07/21/2023  3:32 PM EDT ----- Regarding: 1st Time Imfinzi 1st Time Imfinzi Dr Marguerita Shih No issues during treatment.

## 2023-07-22 NOTE — Telephone Encounter (Signed)
-----   Message from Aurelio Blower sent at 07/21/2023  8:31 PM EDT ----- Her potassium is low.  Please encourage the patient to increase her potassium rich diet.  Thank you ----- Message ----- From: Dannis Dy, Lab In [place] Sent: 07/21/2023   1:04 PM EDT To: Marlene Simas, MD

## 2023-07-22 NOTE — Telephone Encounter (Signed)
 LM for patient that this nurse was calling to see how they were doing after their treatment. Please call back to Dr. Asa Lente nurse at [phone] if they have any questions or concerns regarding the treatment.

## 2023-07-29 ENCOUNTER — Other Ambulatory Visit: Payer: Self-pay | Admitting: Endocrinology

## 2023-07-29 ENCOUNTER — Encounter: Payer: Self-pay | Admitting: Endocrinology

## 2023-07-29 ENCOUNTER — Ambulatory Visit (INDEPENDENT_AMBULATORY_CARE_PROVIDER_SITE_OTHER): Admitting: Endocrinology

## 2023-07-29 ENCOUNTER — Ambulatory Visit: Payer: Self-pay | Admitting: Endocrinology

## 2023-07-29 VITALS — BP 130/80 | HR 114 | Resp 20 | Ht 63.0 in | Wt 169.8 lb

## 2023-07-29 DIAGNOSIS — Z7984 Long term (current) use of oral hypoglycemic drugs: Secondary | ICD-10-CM

## 2023-07-29 DIAGNOSIS — E1165 Type 2 diabetes mellitus with hyperglycemia: Secondary | ICD-10-CM

## 2023-07-29 DIAGNOSIS — Z794 Long term (current) use of insulin: Secondary | ICD-10-CM

## 2023-07-29 LAB — POCT GLYCOSYLATED HEMOGLOBIN (HGB A1C): Hemoglobin A1C: 6.9 % — AB (ref 4.0–5.6)

## 2023-07-29 MED ORDER — METFORMIN HCL 500 MG PO TABS
500.0000 mg | ORAL_TABLET | Freq: Two times a day (BID) | ORAL | 3 refills | Status: DC
Start: 2023-07-29 — End: 2023-11-04

## 2023-07-29 MED ORDER — INSULIN GLARGINE 100 UNIT/ML SOLOSTAR PEN: 15.0000 [IU] | PEN_INJECTOR | Freq: Every day | SUBCUTANEOUS | 4 refills | Status: DC

## 2023-07-29 MED ORDER — SITAGLIPTIN PHOSPHATE 100 MG PO TABS: 100.0000 mg | ORAL_TABLET | Freq: Every evening | ORAL | 3 refills | Status: AC

## 2023-07-29 MED ORDER — GLIPIZIDE 5 MG PO TABS: 5.0000 mg | ORAL_TABLET | Freq: Two times a day (BID) | ORAL | 3 refills | Status: AC
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
## 2023-07-29 NOTE — Patient Instructions (Addendum)
 Latest Reference Range & Units 01/21/23 12:07 04/21/23 13:52 07/29/23 16:08  Hemoglobin A1C 4.0 - 5.6 % 9.6 (H) 10.7 ! Pend 6.9 !  (H): Data is abnormally high !: Data is abnormal  Continue Januvia , metformin  the same doses.  Lantus  15 units daily in the morning.   Decrease Glipizide  to 5 mg two times a day.   Check blood sugar before meals and at bedtime.  Bring glucometer in the follow-up visit.

## 2023-07-30 LAB — MICROALBUMIN / CREATININE URINE RATIO
Creatinine, Urine: 123 mg/dL (ref 20–275)
Microalb Creat Ratio: 14 mg/g{creat} (ref <30)
Microalb, Ur: 1.7 mg/dL

## 2023-08-18 ENCOUNTER — Inpatient Hospital Stay (HOSPITAL_BASED_OUTPATIENT_CLINIC_OR_DEPARTMENT_OTHER): Admitting: Internal Medicine

## 2023-08-18 ENCOUNTER — Inpatient Hospital Stay: Attending: Internal Medicine

## 2023-08-18 ENCOUNTER — Inpatient Hospital Stay

## 2023-08-18 VITALS — BP 123/69 | HR 96 | Temp 98.2°F | Resp 17 | Ht 63.0 in | Wt 168.6 lb

## 2023-08-18 DIAGNOSIS — Z923 Personal history of irradiation: Secondary | ICD-10-CM | POA: Diagnosis not present

## 2023-08-18 DIAGNOSIS — M069 Rheumatoid arthritis, unspecified: Secondary | ICD-10-CM | POA: Diagnosis not present

## 2023-08-18 DIAGNOSIS — Z886 Allergy status to analgesic agent status: Secondary | ICD-10-CM | POA: Insufficient documentation

## 2023-08-18 DIAGNOSIS — I10 Essential (primary) hypertension: Secondary | ICD-10-CM | POA: Diagnosis not present

## 2023-08-18 DIAGNOSIS — Z885 Allergy status to narcotic agent status: Secondary | ICD-10-CM | POA: Insufficient documentation

## 2023-08-18 DIAGNOSIS — C3431 Malignant neoplasm of lower lobe, right bronchus or lung: Secondary | ICD-10-CM | POA: Insufficient documentation

## 2023-08-18 DIAGNOSIS — Z5112 Encounter for antineoplastic immunotherapy: Secondary | ICD-10-CM | POA: Insufficient documentation

## 2023-08-18 DIAGNOSIS — E119 Type 2 diabetes mellitus without complications: Secondary | ICD-10-CM | POA: Insufficient documentation

## 2023-08-18 DIAGNOSIS — Z79899 Other long term (current) drug therapy: Secondary | ICD-10-CM | POA: Insufficient documentation

## 2023-08-18 LAB — CBC WITH DIFFERENTIAL (CANCER CENTER ONLY)
Abs Immature Granulocytes: 0.04 10*3/uL (ref 0.00–0.07)
Basophils Absolute: 0 10*3/uL (ref 0.0–0.1)
Basophils Relative: 0 %
Eosinophils Absolute: 0 10*3/uL (ref 0.0–0.5)
Eosinophils Relative: 1 %
HCT: 34.5 % — ABNORMAL LOW (ref 36.0–46.0)
Hemoglobin: 10.5 g/dL — ABNORMAL LOW (ref 12.0–15.0)
Immature Granulocytes: 1 %
Lymphocytes Relative: 18 %
Lymphs Abs: 1.1 10*3/uL (ref 0.7–4.0)
MCH: 25.1 pg — ABNORMAL LOW (ref 26.0–34.0)
MCHC: 30.4 g/dL (ref 30.0–36.0)
MCV: 82.5 fL (ref 80.0–100.0)
Monocytes Absolute: 0.9 10*3/uL (ref 0.1–1.0)
Monocytes Relative: 14 %
Neutro Abs: 4.1 10*3/uL (ref 1.7–7.7)
Neutrophils Relative %: 66 %
Platelet Count: 227 10*3/uL (ref 150–400)
RBC: 4.18 MIL/uL (ref 3.87–5.11)
RDW: 18.4 % — ABNORMAL HIGH (ref 11.5–15.5)
WBC Count: 6.2 10*3/uL (ref 4.0–10.5)
nRBC: 0 % (ref 0.0–0.2)

## 2023-08-18 LAB — CMP (CANCER CENTER ONLY)
ALT: 9 U/L (ref 0–44)
AST: 12 U/L — ABNORMAL LOW (ref 15–41)
Albumin: 3.5 g/dL (ref 3.5–5.0)
Alkaline Phosphatase: 74 U/L (ref 38–126)
Anion gap: 6 (ref 5–15)
BUN: 17 mg/dL (ref 8–23)
CO2: 29 mmol/L (ref 22–32)
Calcium: 9.3 mg/dL (ref 8.9–10.3)
Chloride: 105 mmol/L (ref 98–111)
Creatinine: 1.13 mg/dL — ABNORMAL HIGH (ref 0.44–1.00)
GFR, Estimated: 50 mL/min — ABNORMAL LOW (ref >60)
Glucose, Bld: 136 mg/dL — ABNORMAL HIGH (ref 70–99)
Potassium: 4.6 mmol/L (ref 3.5–5.1)
Sodium: 140 mmol/L (ref 135–145)
Total Bilirubin: 0.5 mg/dL (ref 0.0–1.2)
Total Protein: 7.8 g/dL (ref 6.5–8.1)

## 2023-08-18 MED ORDER — SODIUM CHLORIDE 0.9 % IV SOLN
1500.0000 mg | Freq: Once | INTRAVENOUS | Status: AC
  Administered 2023-08-18: 1500 mg via INTRAVENOUS
  Filled 2023-08-18: qty 30

## 2023-08-18 MED ORDER — SODIUM CHLORIDE 0.9 % IV SOLN: INTRAVENOUS | Status: DC

## 2023-08-18 MED ORDER — HEPARIN SOD (PORK) LOCK FLUSH 100 UNIT/ML IV SOLN
500.0000 [IU] | Freq: Once | INTRAVENOUS | Status: AC | PRN
  Administered 2023-08-18: 500 [IU]

## 2023-08-18 MED ORDER — SODIUM CHLORIDE 0.9% FLUSH
10.0000 mL | INTRAVENOUS | Status: DC | PRN
  Administered 2023-08-18: 10 mL

## 2023-08-18 NOTE — Patient Instructions (Signed)

## 2023-08-18 NOTE — Progress Notes (Signed)
 [facility] [phone]   [phone]  OFFICE PROGRESS NOTE  Donley Furth, MD [address] [place] [address]  DIAGNOSIS:   1) Stage IIIa (T1c, N2, M0) non-small cell lung cancer, adenocarcinoma presented with right lower lobe lung nodule in addition to right paratracheal lymphadenopathy diagnosed in February 2025.  Molecular studies by [id number] showed no actionable mutations and negative PD-L1 expression.   2) history of rheumatoid arthritis and currently on treatment with Plaquenil .  PRIOR THERAPY: A course of concurrent chemoradiation with weekly carboplatin  for AUC of 2 and paclitaxel  45 Mg/M2.  First dose May 04, 2023.  Status post 7 cycles.  Last dose was given on 06/15/2023.  CURRENT THERAPY: Consolidation treatment with immunotherapy with Imfinzi  1500 Mg IV every 4 weeks status post 1 cycle.  INTERVAL HISTORY: Wendy [name] 78 y.o. female returns to the clinic today for follow-up visit accompanied by her granddaughter.  Discussed the use of AI scribe software for clinical note transcription with the patient, who gave verbal consent to proceed.  History of Present Illness   Wendy [name] is a 79 year old female with stage III non-small cell lung cancer who presents for consolidation treatment with immunotherapy.  She was diagnosed with stage III non-small cell lung cancer, adenocarcinoma, in February 2025. She has completed a course of concurrent chemoradiation and is currently undergoing consolidation treatment with immunotherapy using Imfinzi , administered every four weeks. She has completed one cycle of this treatment.  The first cycle of immunotherapy was well-tolerated without any adverse effects such as nausea, vomiting, diarrhea, headaches, or chest pain. No weight loss has been noted, and there is a slight weight gain. There have been no changes in her medications.  No nausea, vomiting, diarrhea, headaches,  chest pain, or weight loss.       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 Stefan Edge     ALLERGIES:  is allergic to aspirin and tramadol.  MEDICATIONS:  Current Outpatient Medications  Medication Sig Dispense Refill   ACCU-CHEK GUIDE TEST test strip USE TO TEST ONCE DAILY 100 strip 1   Accu-Chek Softclix Lancets lancets Test once per day and diagnosis code is E 11.9 and dispense for (ACCU-CHEK Soft Touch) 100 each 12   ALPRAZolam  (XANAX ) 1 MG tablet TAKE 1 TABLET (1 MG TOTAL) BY MOUTH AT BEDTIME AS NEEDED FOR ANXIETY OR SLEEP. 30 tablet 5   amLODipine  (NORVASC ) 10 MG tablet Take 1 tablet (10 mg total) by mouth daily. 90 tablet 3   atorvastatin  (LIPITOR) 10 MG tablet TAKE 1 TABLET BY MOUTH EVERY DAY 90 tablet 3   Blood Glucose Monitoring Suppl (ACCU-CHEK  GUIDE) w/Device KIT Use to test sugars daily. 1 kit 0   Blood Glucose Monitoring Suppl DEVI 1 each by Does not apply route in the morning, at noon, and at bedtime. May substitute to any manufacturer covered by patient's insurance. 1 each 0   Blood Pressure Monitor KIT Use to check blood pressure. 1 kit 0   Cholecalciferol (VITAMIN D3) 20 MCG (800 UNIT) TABS Take 800 Units by mouth daily.     DULoxetine  (CYMBALTA ) 30 MG capsule Take 1 capsule (30 mg total) by mouth at bedtime.     fluticasone  (FLONASE ) 50 MCG/ACT  nasal spray SPRAY 2 SPRAYS INTO EACH NOSTRIL EVERY DAY 48 mL 3   furosemide  (LASIX ) 20 MG tablet Take 1 tablet (20 mg total) by mouth daily as needed (fluid retention). 90 tablet 3   Ginkgo Biloba (GNP GINGKO BILOBA EXTRACT PO) Take by mouth.     glipiZIDE  (GLUCOTROL ) 5 MG tablet Take 1 tablet (5 mg total) by mouth 2 (two) times daily before a meal. 180 tablet 3   Glucose Blood (BLOOD GLUCOSE TEST STRIPS) STRP 1 each by In Vitro route in the morning, at noon, and at bedtime. May substitute to any manufacturer covered by patient's insurance. 100 each 3   HYDROcodone -acetaminophen  (NORCO) 10-325 MG tablet Take 1 tablet by mouth every 6 (six) hours as needed for moderate pain (pain score 4-6). 120 tablet 0   hydroxychloroquine  (PLAQUENIL ) 200 MG tablet Take 1 tablet (200 mg total) by mouth daily. 90 tablet 1   insulin  degludec (TRESIBA FLEXTOUCH) 100 UNIT/ML FlexTouch Pen Inject 15 Units into the skin daily. 15 mL 4   Insulin  Pen Needle 32G X 4 MM MISC Use 4x a day 300 each 3   Lancets (ACCU-CHEK SOFT TOUCH) lancets Test once per day and diagnosis code is E 11.9 and dispense for Aviva plus 100 each 3   levocetirizine (XYZAL ) 5 MG tablet TAKE 1 TABLET BY MOUTH EVERY DAY IN THE EVENING 90 tablet 3   levocetirizine (XYZAL ) 5 MG tablet Take 1 tablet (5 mg total) by mouth every evening. 90 tablet 3   metFORMIN  (GLUCOPHAGE ) 500 MG tablet Take 1 tablet (500 mg total) by mouth 2 (two) times daily with a meal. 180 tablet 3   metoCLOPramide  (REGLAN ) 10 MG tablet Take 1 tablet (10 mg total) by mouth 3 (three) times daily with meals for 5 days. 15 tablet 0   mupirocin  ointment (BACTROBAN ) 2 % Apply 1 Application topically 2 (two) times daily. 22 g 5   omeprazole  (PRILOSEC) 40 MG capsule TAKE 1 CAPSULE BY MOUTH EVERY DAY 90 capsule 3   ondansetron  (ZOFRAN ) 4 MG tablet Take 1 tablet (4 mg total) by mouth every 8 (eight) hours as needed for nausea or vomiting. 90 tablet 3   polyethylene glycol (MIRALAX  / GLYCOLAX ) 17  g packet Take 17 g by mouth daily as needed for mild constipation.     potassium chloride  (KLOR-CON ) 10 MEQ tablet Take 1 tablet (10 mEq total) by mouth daily. 90 tablet 3   pregabalin  (LYRICA ) 100 MG capsule Take 1 capsule (100 mg total) by mouth at bedtime. 90 capsule 1   RESTASIS  0.05 % ophthalmic emulsion Place 1 drop into both eyes 2 (two) times daily.     sitaGLIPtin  (JANUVIA ) 100 MG tablet Take 1 tablet (100 mg total) by mouth at bedtime. 90 tablet 3   sucralfate  (CARAFATE ) 1 g tablet Take 1 tablet (1 g total) by mouth 4 (four) times daily. Dissolve each  tablet in 15 cc water before use. 120 tablet 2   No current facility-administered medications for this visit.    SURGICAL HISTORY:  Past Surgical History:  Procedure Laterality Date   BRONCHIAL BRUSHINGS  04/07/2023   Procedure: BRONCHIAL BRUSHINGS;  Surgeon: Denson Flake, MD;  Location: [facility] ENDOSCOPY;  Service: Pulmonary;;   BRONCHIAL NEEDLE ASPIRATION BIOPSY  04/07/2023   Procedure: BRONCHIAL NEEDLE ASPIRATION BIOPSIES;  Surgeon: Denson Flake, MD;  Location: [facility] ENDOSCOPY;  Service: Pulmonary;;   CATARACT EXTRACTION     COLONOSCOPY  11/23/2019   per Dr. Howard Macho, adenmatous polyp, repeat in 7 yrs   ESOPHAGOGASTRODUODENOSCOPY  06/20/2008   at [facility], clear    FINE NEEDLE ASPIRATION  04/07/2023   Procedure: FINE NEEDLE ASPIRATION (FNA) LINEAR;  Surgeon: Denson Flake, MD;  Location: [facility] ENDOSCOPY;  Service: Pulmonary;;   IR IMAGING GUIDED PORT INSERTION  04/30/2023   LUMBAR LAMINECTOMY     1995   ROTATOR CUFF REPAIR Right    SPINAL FUSION N/A 07/29/2022   Procedure: T10-PELVIS INSTRUMENTATION/ T10-L4 POSTERIOR SPINAL FUSION;  Surgeon: Diedra Fowler, MD;  Location: [facility] OR;  Service: Orthopedics;  Laterality: N/A;   UPPER GASTROINTESTINAL ENDOSCOPY  2010   [facility]    VIDEO BRONCHOSCOPY WITH ENDOBRONCHIAL ULTRASOUND N/A 04/07/2023   Procedure: VIDEO BRONCHOSCOPY WITH ENDOBRONCHIAL ULTRASOUND;  Surgeon: Denson Flake, MD;   Location: [facility] ENDOSCOPY;  Service: Pulmonary;  Laterality: N/A;    REVIEW OF SYSTEMS:  A comprehensive review of systems was negative.   PHYSICAL EXAMINATION: General appearance: alert, cooperative, fatigued, and no distress Head: Normocephalic, without obvious abnormality, atraumatic Neck: no adenopathy, no JVD, supple, symmetrical, trachea midline, and thyroid  not enlarged, symmetric, no tenderness/mass/nodules Lymph nodes: Cervical, supraclavicular, and axillary nodes normal. Resp: clear to auscultation bilaterally Back: symmetric, no curvature. ROM normal. No CVA tenderness. Cardio: regular rate and rhythm, S1, S2 normal, no murmur, click, rub or gallop GI: soft, non-tender; bowel sounds normal; no masses,  no organomegaly Extremities: extremities normal, atraumatic, no cyanosis or edema  ECOG PERFORMANCE STATUS: 1 - Symptomatic but completely ambulatory  Blood pressure 123/69, pulse 96, temperature 98.2 F (36.8 C), temperature source Oral, resp. rate 17, height 5' 3 (1.6 m), weight 168 lb 9.6 oz (76.5 kg), SpO2 99%.  LABORATORY DATA: Lab Results  Component Value Date   WBC 6.2 08/18/2023   HGB 10.5 (L) 08/18/2023   HCT 34.5 (L) 08/18/2023   MCV 82.5 08/18/2023   PLT 227 08/18/2023      Chemistry      Component Value Date/Time   NA 137 07/21/2023 1237   K 3.1 (L) 07/21/2023 1237   CL 106 07/21/2023 1237   CO2 24 07/21/2023 1237   BUN 17 07/21/2023 1237   CREATININE 1.06 (H) 07/21/2023 1237   CREATININE 1.07 (H) 09/16/2019 1511      Component Value Date/Time   CALCIUM  8.9 07/21/2023 1237   ALKPHOS 67 07/21/2023 1237   AST 19 07/21/2023 1237   ALT 9 07/21/2023 1237   BILITOT 0.8 07/21/2023 1237       RADIOGRAPHIC STUDIES: No results found.    ASSESSMENT AND PLAN: This is a very pleasant 78 years old African-American female with Stage IIIa (T1c, N2, M0) non-small cell lung cancer, adenocarcinoma presented with right lower lobe lung nodule in addition to right  paratracheal lymphadenopathy diagnosed in February 2025. Molecular studies by [id number] showed no actionable mutations and negative PD-L1 expression. She also has a history of rheumatoid arthritis  and currently on treatment with Plaquenil . She underwent a course of concurrent chemoradiation with weekly carboplatin  for AUC of 2 and paclitaxel  45 Mg/M2.  First dose May 04, 2023.  States post 7 cycles.  Last cycle was given on 06/15/2023. She is currently undergoing consolidation treatment with immunotherapy with Imfinzi  1500 Mg IV every 4 weeks status post 1 cycle. She tolerated the first cycle of her treatment fairly well. Assessment and Plan    Stage 3 non-small cell lung cancer, adenocarcinoma Stage 3 non-small cell lung cancer, adenocarcinoma, diagnosed in February 2025. Status post concurrent chemoradiation. Currently undergoing consolidation treatment with immunotherapy (Imfinzi ) every four weeks. Completed one cycle without adverse effects such as nausea, vomiting, diarrhea, headaches, or chest pain. No significant weight loss; slight weight gain noted. Lab work is satisfactory for continuation of treatment. - Proceed with cycle of immunotherapy (Imfinzi ) today.   The patient was advised to call immediately if she has any concerning symptoms in the interval.  The patient voices understanding of current disease status and treatment options and is in agreement with the current care plan.  All questions were answered. The patient knows to call the clinic with any problems, questions or concerns. We can certainly see the patient much sooner if necessary.  The total time spent in the appointment was 20 minutes.  Disclaimer: This note was dictated with voice recognition software. Similar sounding words can inadvertently be transcribed and may not be corrected upon review.

## 2023-08-18 NOTE — Patient Instructions (Signed)
 [facility] - A DEPT OF [facility]  Discharge Instructions: Thank you for choosing [facility] to provide your oncology and hematology care.   If you have a lab appointment with the Cancer Center, please go directly to the Cancer Center and check in at the registration area.   Wear comfortable clothing and clothing appropriate for easy access to any Portacath or PICC line.   We strive to give you quality time with your provider. You may need to reschedule your appointment if you arrive late (15 or more minutes).  Arriving late affects you and other patients whose appointments are after yours.  Also, if you miss three or more appointments without notifying the office, you may be dismissed from the clinic at the provider's discretion.      For prescription refill requests, have your pharmacy contact our office and allow 72 hours for refills to be completed.    Today you received the following chemotherapy and/or immunotherapy agents :  durvalumab      To help prevent nausea and vomiting after your treatment, we encourage you to take your nausea medication as directed.  BELOW ARE SYMPTOMS THAT SHOULD BE REPORTED IMMEDIATELY: *FEVER GREATER THAN 100.4 F (38 C) OR HIGHER *CHILLS OR SWEATING *NAUSEA AND VOMITING THAT IS NOT CONTROLLED WITH YOUR NAUSEA MEDICATION *UNUSUAL SHORTNESS OF BREATH *UNUSUAL BRUISING OR BLEEDING *URINARY PROBLEMS (pain or burning when urinating, or frequent urination) *BOWEL PROBLEMS (unusual diarrhea, constipation, pain near the anus) TENDERNESS IN MOUTH AND THROAT WITH OR WITHOUT PRESENCE OF ULCERS (sore throat, sores in mouth, or a toothache) UNUSUAL RASH, SWELLING OR PAIN  UNUSUAL VAGINAL DISCHARGE OR ITCHING   Items with * indicate a potential emergency and should be followed up as soon as possible or go to the Emergency Department if any problems should occur.  Please show the CHEMOTHERAPY ALERT CARD or  IMMUNOTHERAPY ALERT CARD at check-in to the Emergency Department and triage nurse.  Should you have questions after your visit or need to cancel or reschedule your appointment, please contact [facility] - A DEPT OF [facility]  Dept: [phone]  and follow the prompts.  Office hours are 8:00 a.m. to 4:30 p.m. Monday - Friday. Please note that voicemails left after 4:00 p.m. may not be returned until the following business day.  We are closed weekends and major holidays. You have access to a nurse at all times for urgent questions. Please call the main number to the clinic Dept: [phone] and follow the prompts.   For any non-urgent questions, you may also contact your provider using MyChart. We now offer e-Visits for anyone 30 and older to request care online for non-urgent symptoms. For details visit [email or web address].   Also download the MyChart app! Go to the app store, search "MyChart", open the app, select [place], and log in with your MyChart username and password.

## 2023-08-19 ENCOUNTER — Other Ambulatory Visit: Payer: Self-pay | Admitting: Family Medicine

## 2023-09-01 ENCOUNTER — Other Ambulatory Visit: Payer: Self-pay | Admitting: Family Medicine

## 2023-09-01 NOTE — Telephone Encounter (Signed)
 Copied from CRM [id number]. Topic: Clinical - Medication Refill >> Sep 01, 2023  2:52 PM Adrionna Y wrote: Medication: HYDROcodone -acetaminophen  (NORCO) 10-325 MG tablet  Has the patient contacted their pharmacy? Yes (Agent: If no, request that the patient contact the pharmacy for the refill. If patient does not wish to contact the pharmacy document the reason why and proceed with request.) (Agent: If yes, when and what did the pharmacy advise?)  This is the patient's preferred pharmacy:  [facility] [phone] [address] [place], [place] - [address] [place] [address] Phone: [phone] Fax: [phone]  Is this the correct pharmacy for this prescription? Yes If no, delete pharmacy and type the correct one.   Has the prescription been filled recently? No  Is the patient out of the medication? No, has a 2 days left. Can do a virtual appointment if needed/having trouble with mobility  Has the patient been seen for an appointment in the last year OR does the patient have an upcoming appointment? Yes  Can we respond through MyChart? No, prefer calls  Agent: Please be advised that Rx refills may take up to 3 business days. We ask that you follow-up with your pharmacy.

## 2023-09-03 NOTE — Telephone Encounter (Signed)
 FYI Pt has been scheduled for a PMV on 09/08/23

## 2023-09-08 ENCOUNTER — Telehealth (INDEPENDENT_AMBULATORY_CARE_PROVIDER_SITE_OTHER): Admitting: Family Medicine

## 2023-09-08 ENCOUNTER — Encounter: Payer: Self-pay | Admitting: Internal Medicine

## 2023-09-08 ENCOUNTER — Encounter: Payer: Self-pay | Admitting: Family Medicine

## 2023-09-08 DIAGNOSIS — G8929 Other chronic pain: Secondary | ICD-10-CM | POA: Diagnosis not present

## 2023-09-08 DIAGNOSIS — M48062 Spinal stenosis, lumbar region with neurogenic claudication: Secondary | ICD-10-CM

## 2023-09-08 MED ORDER — HYDROCODONE-ACETAMINOPHEN 10-325 MG PO TABS
1.0000 | ORAL_TABLET | Freq: Four times a day (QID) | ORAL | 0 refills | Status: DC | PRN
Start: 2023-09-08 — End: 2023-12-10

## 2023-09-08 MED ORDER — MUPIROCIN 2 % EX OINT: 1.0000 | TOPICAL_OINTMENT | Freq: Two times a day (BID) | CUTANEOUS | 5 refills | Status: AC

## 2023-09-08 MED ORDER — HYDROCODONE-ACETAMINOPHEN 10-325 MG PO TABS: 1.0000 | ORAL_TABLET | Freq: Four times a day (QID) | ORAL | 0 refills | Status: DC | PRN

## 2023-09-08 NOTE — Progress Notes (Signed)
 Subjective:    Patient ID: Wendy [name], female    DOB: 1946/02/11, 78 y.o.   MRN: [id number]  HPI Virtual Visit via Video Note  I connected with the patient on 09/08/23 at  2:00 PM EDT by a video enabled telemedicine application and verified that I am speaking with the correct person using two identifiers.  Location patient: home Location provider:work or home office Persons participating in the virtual visit: patient, provider  I discussed the limitations of evaluation and management by telemedicine and the availability of in person appointments. The patient expressed understanding and agreed to proceed.   HPI: Here for pain management. She is doing well.    ROS: See pertinent positives and negatives per HPI.  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 Jon Jacob     Past Surgical History:  Procedure Laterality Date   BRONCHIAL BRUSHINGS  04/07/2023   Procedure: BRONCHIAL BRUSHINGS;  Surgeon: Shelah Lamar RAMAN, MD;  Location: [facility] ENDOSCOPY;  Service: Pulmonary;;   BRONCHIAL NEEDLE ASPIRATION BIOPSY  04/07/2023   Procedure: BRONCHIAL NEEDLE ASPIRATION BIOPSIES;  Surgeon: Shelah Lamar RAMAN, MD;  Location: [facility] ENDOSCOPY;  Service: Pulmonary;;   CATARACT EXTRACTION     COLONOSCOPY  11/23/2019   per Dr. Teressa, adenmatous polyp, repeat in 7 yrs   ESOPHAGOGASTRODUODENOSCOPY   06/20/2008   at [facility], clear    FINE NEEDLE ASPIRATION  04/07/2023   Procedure: FINE NEEDLE ASPIRATION (FNA) LINEAR;  Surgeon: Shelah Lamar RAMAN, MD;  Location: [facility] ENDOSCOPY;  Service: Pulmonary;;   IR IMAGING GUIDED PORT INSERTION  04/30/2023   LUMBAR LAMINECTOMY     1995   ROTATOR CUFF REPAIR Right    SPINAL FUSION N/A 07/29/2022   Procedure: T10-PELVIS INSTRUMENTATION/ T10-L4 POSTERIOR SPINAL FUSION;  Surgeon: Georgina Ozell LABOR, MD;  Location: [facility] OR;  Service: Orthopedics;  Laterality: N/A;   UPPER GASTROINTESTINAL ENDOSCOPY  2010   [facility]    VIDEO BRONCHOSCOPY WITH ENDOBRONCHIAL ULTRASOUND N/A 04/07/2023   Procedure: VIDEO BRONCHOSCOPY WITH ENDOBRONCHIAL ULTRASOUND;  Surgeon: Shelah Lamar RAMAN, MD;  Location: [facility] ENDOSCOPY;  Service: Pulmonary;  Laterality: N/A;    Family History  Problem Relation Age of Onset   Arthritis Other    Diabetes Other    Hypertension Other    Cancer Other        lung   Lung cancer Mother    Colon cancer Neg Hx    Colon polyps Neg Hx    Esophageal cancer Neg Hx    Rectal cancer Neg Hx    Stomach cancer Neg Hx      Current Outpatient Medications:    ACCU-CHEK GUIDE TEST test strip, USE TO TEST ONCE DAILY, Disp: 100 strip,  Rfl: 1   Accu-Chek Softclix Lancets lancets, Test once per day and diagnosis code is E 11.9 and dispense for (ACCU-CHEK Soft Touch), Disp: 100 each, Rfl: 12   ALPRAZolam  (XANAX ) 1 MG tablet, TAKE 1 TABLET (1 MG TOTAL) BY MOUTH AT BEDTIME AS NEEDED FOR ANXIETY OR SLEEP., Disp: 30 tablet, Rfl: 5   amLODipine  (NORVASC ) 10 MG tablet, Take 1 tablet (10 mg total) by mouth daily., Disp: 90 tablet, Rfl: 3   atorvastatin  (LIPITOR) 10 MG tablet, TAKE 1 TABLET BY MOUTH EVERY DAY, Disp: 90 tablet, Rfl: 3   Blood Glucose Monitoring Suppl (ACCU-CHEK GUIDE) w/Device KIT, Use to test sugars daily., Disp: 1 kit, Rfl: 0   Blood Glucose Monitoring Suppl DEVI, 1 each by Does not apply route in the morning, at noon, and at bedtime. May substitute to  any manufacturer covered by patient's insurance., Disp: 1 each, Rfl: 0   Blood Pressure Monitor KIT, Use to check blood pressure., Disp: 1 kit, Rfl: 0   Cholecalciferol (VITAMIN D3) 20 MCG (800 UNIT) TABS, Take 800 Units by mouth daily., Disp: , Rfl:    DULoxetine  (CYMBALTA ) 30 MG capsule, Take 1 capsule (30 mg total) by mouth at bedtime., Disp: , Rfl:    fluticasone  (FLONASE ) 50 MCG/ACT nasal spray, SPRAY 2 SPRAYS INTO EACH NOSTRIL EVERY DAY, Disp: 48 mL, Rfl: 3   furosemide  (LASIX ) 20 MG tablet, Take 1 tablet (20 mg total) by mouth daily as needed (fluid retention)., Disp: 90 tablet, Rfl: 3   Ginkgo Biloba (GNP GINGKO BILOBA EXTRACT PO), Take by mouth., Disp: , Rfl:    glipiZIDE  (GLUCOTROL ) 5 MG tablet, Take 1 tablet (5 mg total) by mouth 2 (two) times daily before a meal., Disp: 180 tablet, Rfl: 3   HYDROcodone -acetaminophen  (NORCO) 10-325 MG tablet, Take 1 tablet by mouth every 6 (six) hours as needed for moderate pain (pain score 4-6)., Disp: 120 tablet, Rfl: 0   hydroxychloroquine  (PLAQUENIL ) 200 MG tablet, Take 1 tablet (200 mg total) by mouth daily., Disp: 90 tablet, Rfl: 1   insulin  degludec (TRESIBA FLEXTOUCH) 100 UNIT/ML FlexTouch Pen, Inject 15 Units into the skin daily., Disp: 15 mL, Rfl: 4   Insulin  Pen Needle 32G X 4 MM MISC, Use 4x a day, Disp: 300 each, Rfl: 3   Lancets (ACCU-CHEK SOFT TOUCH) lancets, Test once per day and diagnosis code is E 11.9 and dispense for Aviva plus, Disp: 100 each, Rfl: 3   levocetirizine (XYZAL ) 5 MG tablet, TAKE 1 TABLET BY MOUTH EVERY DAY IN THE EVENING, Disp: 90 tablet, Rfl: 3   levocetirizine (XYZAL ) 5 MG tablet, Take 1 tablet (5 mg total) by mouth every evening., Disp: 90 tablet, Rfl: 3   metFORMIN  (GLUCOPHAGE ) 500 MG tablet, Take 1 tablet (500 mg total) by mouth 2 (two) times daily with a meal., Disp: 180 tablet, Rfl: 3   metoCLOPramide  (REGLAN ) 10 MG tablet, Take 1 tablet (10 mg total) by mouth 3 (three) times daily with meals for 5 days., Disp: 15  tablet, Rfl: 0   mupirocin  ointment (BACTROBAN ) 2 %, Apply 1 Application topically 2 (two) times daily., Disp: 22 g, Rfl: 5   omeprazole  (PRILOSEC) 40 MG capsule, TAKE 1 CAPSULE BY MOUTH EVERY DAY, Disp: 90 capsule, Rfl: 3   ondansetron  (ZOFRAN ) 4 MG tablet, Take 1 tablet (4 mg total) by mouth every 8 (eight) hours as needed for nausea or vomiting., Disp: 90 tablet, Rfl: 3   polyethylene glycol (MIRALAX  / GLYCOLAX ) 17 g packet, Take 17  g by mouth daily as needed for mild constipation., Disp: , Rfl:    potassium chloride  (KLOR-CON ) 10 MEQ tablet, TAKE 1 TABLET BY MOUTH EVERY DAY, Disp: 90 tablet, Rfl: 3   pregabalin  (LYRICA ) 100 MG capsule, Take 1 capsule (100 mg total) by mouth at bedtime., Disp: 90 capsule, Rfl: 1   RESTASIS  0.05 % ophthalmic emulsion, Place 1 drop into both eyes 2 (two) times daily., Disp: , Rfl:    sitaGLIPtin  (JANUVIA ) 100 MG tablet, Take 1 tablet (100 mg total) by mouth at bedtime., Disp: 90 tablet, Rfl: 3   sucralfate  (CARAFATE ) 1 g tablet, Take 1 tablet (1 g total) by mouth 4 (four) times daily. Dissolve each tablet in 15 cc water before use., Disp: 120 tablet, Rfl: 2  EXAM:  VITALS per patient if applicable:  GENERAL: alert, oriented, appears well and in no acute distress  HEENT: atraumatic, conjunttiva clear, no obvious abnormalities on inspection of external nose and ears  NECK: normal movements of the head and neck  LUNGS: on inspection no signs of respiratory distress, breathing rate appears normal, no obvious gross SOB, gasping or wheezing  CV: no obvious cyanosis  MS: moves all visible extremities without noticeable abnormality  PSYCH/NEURO: pleasant and cooperative, no obvious depression or anxiety, speech and thought processing grossly intact  ASSESSMENT AND PLAN: Pain management.  Indication for chronic opioid: low back pain Medication and dose: Norco 10-325 # pills per month: 120 Last UDS date: 12-17-22 Opioid Treatment Agreement signed (Y/N):  06-19-17 Opioid Treatment Agreement last reviewed with patient:  09-08-23 [facility] reviewed this encounter (include red flags): Yes Meds were refilled.  Garnette Olmsted, MD  Discussed the following assessment and plan:  No diagnosis found.     I discussed the assessment and treatment plan with the patient. The patient was provided an opportunity to ask questions and all were answered. The patient agreed with the plan and demonstrated an understanding of the instructions.   The patient was advised to call back or seek an in-person evaluation if the symptoms worsen or if the condition fails to improve as anticipated.      Review of Systems     Objective:   Physical Exam        Assessment & Plan:

## 2023-09-11 NOTE — Progress Notes (Unsigned)
 [facility] OFFICE PROGRESS NOTE  [name] LABOR, MD [address] [place] [address]  DIAGNOSIS:  1) Stage IIIa (T1c, N2, M0) non-small cell lung cancer, adenocarcinoma presented with right lower lobe lung nodule in addition to right paratracheal lymphadenopathy diagnosed in February 2025.   Molecular studies by [email or web address] showed no actionable mutations and negative PD-L1 expression.   2) history of rheumatoid arthritis and currently on treatment with Plaquenil .  PRIOR THERAPY: A course of concurrent chemoradiation with weekly carboplatin  for AUC of 2 and paclitaxel  45 Mg/M2. First dose May 04, 2023. Status post 7 cycles. Last dose was given on 06/15/2023.   CURRENT THERAPY: Consolidation treatment with immunotherapy with Imfinzi  1500 Mg IV every 4 weeks status post 2 cycle.   INTERVAL HISTORY: Wendy [name] 78 y.o. female returns to the clinic today for a follow-up visit.  The patient was last seen by Dr. Sherrod on 08/18/2023 the patient is currently undergoing immunotherapy with Imfinzi .  She is status post 2 cycles and has tolerated well.  Denies any fever, chills, night sweats, or unexplained weight loss.  She denies any chest pain,cough, or hemoptysis. She gets dyspnea on exertion which is unchanged. Denies any nausea or vomiting. She sometimes has constipation and diarrhea. She denies any usual changes in her bowel habits though. She sometimes has a rash on her face after eating seafood. She states this resolves with antibiotic ointment given by her PCP. She denies any headache or visual changes.  She is here today for evaluation and repeat blood work before undergoing cycle #3.   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 Jon Jacob     ALLERGIES:  is allergic to aspirin and tramadol.  MEDICATIONS:  Current Outpatient Medications  Medication Sig Dispense Refill   ACCU-CHEK GUIDE TEST test strip USE TO TEST ONCE DAILY 100 strip 1   Accu-Chek Softclix Lancets lancets Test once per day and diagnosis code is E 11.9 and dispense for (ACCU-CHEK Soft Touch) 100 each 12   ALPRAZolam  (XANAX ) 1 MG tablet TAKE 1 TABLET (1 MG TOTAL) BY MOUTH AT BEDTIME AS NEEDED FOR ANXIETY OR SLEEP. 30 tablet 5   amLODipine  (NORVASC ) 10 MG tablet Take 1 tablet (10 mg total) by mouth daily. 90 tablet 3   atorvastatin  (LIPITOR) 10 MG tablet TAKE 1 TABLET BY MOUTH EVERY DAY 90 tablet 3   Blood Glucose Monitoring Suppl (ACCU-CHEK GUIDE) w/Device KIT Use to test sugars daily. 1 kit 0   Blood Glucose Monitoring Suppl DEVI 1 each by Does not apply route in the morning, at noon, and at bedtime. May substitute to any manufacturer covered by patient's insurance. 1 each 0  Blood Pressure Monitor KIT Use to check blood pressure. 1 kit 0   Cholecalciferol (VITAMIN D3) 20 MCG (800 UNIT) TABS Take 800 Units by mouth daily.     DULoxetine  (CYMBALTA ) 30 MG capsule Take 1 capsule (30 mg total) by mouth at bedtime.     fluticasone  (FLONASE ) 50 MCG/ACT nasal spray SPRAY 2 SPRAYS INTO EACH NOSTRIL EVERY DAY 48 mL 3   furosemide  (LASIX ) 20 MG tablet Take 1 tablet (20 mg total) by mouth daily as needed (fluid retention). 90 tablet 3   Ginkgo Biloba (GNP GINGKO BILOBA EXTRACT PO) Take by mouth.     glipiZIDE  (GLUCOTROL ) 5 MG tablet Take 1 tablet (5 mg total) by mouth  2 (two) times daily before a meal. 180 tablet 3   HYDROcodone -acetaminophen  (NORCO) 10-325 MG tablet Take 1 tablet by mouth every 6 (six) hours as needed for moderate pain (pain score 4-6). 120 tablet 0   HYDROcodone -acetaminophen  (NORCO) 10-325 MG tablet Take 1 tablet by mouth every 6 (six) hours as needed for moderate pain (pain score 4-6). 120 tablet 0   HYDROcodone -acetaminophen  (NORCO) 10-325 MG tablet Take 1 tablet by mouth every 6 (six) hours as needed for moderate pain (pain score 4-6). 120 tablet 0   hydroxychloroquine  (PLAQUENIL ) 200 MG tablet Take 1 tablet (200 mg total) by mouth daily. 90 tablet 1   insulin  degludec (TRESIBA FLEXTOUCH) 100 UNIT/ML FlexTouch Pen Inject 15 Units into the skin daily. 15 mL 4   Insulin  Pen Needle 32G X 4 MM MISC Use 4x a day 300 each 3   Lancets (ACCU-CHEK SOFT TOUCH) lancets Test once per day and diagnosis code is E 11.9 and dispense for Aviva plus 100 each 3   levocetirizine (XYZAL ) 5 MG tablet TAKE 1 TABLET BY MOUTH EVERY DAY IN THE EVENING 90 tablet 3   levocetirizine (XYZAL ) 5 MG tablet Take 1 tablet (5 mg total) by mouth every evening. 90 tablet 3   metFORMIN  (GLUCOPHAGE ) 500 MG tablet Take 1 tablet (500 mg total) by mouth 2 (two) times daily with a meal. 180 tablet 3   metoCLOPramide  (REGLAN ) 10 MG tablet Take 1 tablet (10 mg total) by mouth 3 (three) times daily with meals for 5 days. 15 tablet 0   mupirocin  ointment (BACTROBAN ) 2 % Apply 1 Application topically 2 (two) times daily. 22 g 5   omeprazole  (PRILOSEC) 40 MG capsule TAKE 1 CAPSULE BY MOUTH EVERY DAY 90 capsule 3   ondansetron  (ZOFRAN ) 4 MG tablet Take 1 tablet (4 mg total) by mouth every 8 (eight) hours as needed for nausea or vomiting. 90 tablet 3   polyethylene glycol (MIRALAX  / GLYCOLAX ) 17 g packet Take 17 g by mouth daily as needed for mild constipation.     potassium chloride  (KLOR-CON ) 10 MEQ tablet TAKE 1 TABLET BY MOUTH EVERY DAY 90 tablet 3   pregabalin  (LYRICA ) 100 MG capsule  Take 1 capsule (100 mg total) by mouth at bedtime. 90 capsule 1   RESTASIS  0.05 % ophthalmic emulsion Place 1 drop into both eyes 2 (two) times daily.     sitaGLIPtin  (JANUVIA ) 100 MG tablet Take 1 tablet (100 mg total) by mouth at bedtime. 90 tablet 3   sucralfate  (CARAFATE ) 1 g tablet Take 1 tablet (1 g total) by mouth 4 (four) times daily. Dissolve each tablet in 15 cc water before use. 120 tablet 2   No current facility-administered medications for this visit.    SURGICAL HISTORY:  Past Surgical History:  Procedure  Laterality Date   BRONCHIAL BRUSHINGS  04/07/2023   Procedure: BRONCHIAL BRUSHINGS;  Surgeon: Shelah Lamar RAMAN, MD;  Location: [facility] ENDOSCOPY;  Service: Pulmonary;;   BRONCHIAL NEEDLE ASPIRATION BIOPSY  04/07/2023   Procedure: BRONCHIAL NEEDLE ASPIRATION BIOPSIES;  Surgeon: Shelah Lamar RAMAN, MD;  Location: [facility] ENDOSCOPY;  Service: Pulmonary;;   CATARACT EXTRACTION     COLONOSCOPY  11/23/2019   per Dr. Teressa, adenmatous polyp, repeat in 7 yrs   ESOPHAGOGASTRODUODENOSCOPY  06/20/2008   at [facility], clear    FINE NEEDLE ASPIRATION  04/07/2023   Procedure: FINE NEEDLE ASPIRATION (FNA) LINEAR;  Surgeon: Shelah Lamar RAMAN, MD;  Location[facility] ENDOSCOPY;  Service: Pulmonary;;   IR IMAGING GUIDED PORT INSERTION  04/30/2023   LUMBAR LAMINECTOMY     1995   ROTATOR CUFF REPAIR Right    SPINAL FUSION N/A 07/29/2022   Procedure: T10-PELVIS INSTRUMENTATION/ T10-L4 POSTERIOR SPINAL FUSION;  Surgeon: Georgina Ozell LABOR, MD;  Location: [facility] OR;  Service: Orthopedics;  Laterality: N/A;   UPPER GASTROINTESTINAL ENDOSCOPY  2010   [facility]    VIDEO BRONCHOSCOPY WITH ENDOBRONCHIAL ULTRASOUND N/A 04/07/2023   Procedure: VIDEO BRONCHOSCOPY WITH ENDOBRONCHIAL ULTRASOUND;  Surgeon: Shelah Lamar RAMAN, MD;  Location: [facility] ENDOSCOPY;  Service: Pulmonary;  Laterality: N/A;    REVIEW OF SYSTEMS:   Review of Systems  Constitutional: Negative for appetite change, chills, fatigue, fever and unexpected weight change.   HENT: Negative for mouth sores, nosebleeds, sore throat and trouble swallowing.   Eyes: Negative for eye problems and icterus.  Respiratory: Positive for occasional shortness of breath with exertion. Negative for cough, hemoptysis, and wheezing.   Cardiovascular: Negative for chest pain and leg swelling.  Gastrointestinal: Negative for abdominal pain, constipation, diarrhea, nausea and vomiting.  Genitourinary: Negative for bladder incontinence, difficulty urinating, dysuria, frequency and hematuria.   Musculoskeletal: Negative for back pain, gait problem, neck pain and neck stiffness.  Skin: Occasional rash on face.   Neurological: Negative for dizziness, extremity weakness, gait problem, headaches, light-headedness and seizures.  Hematological: Negative for adenopathy. Does not bruise/bleed easily.  Psychiatric/Behavioral: Negative for confusion, depression and sleep disturbance. The patient is not nervous/anxious.     PHYSICAL EXAMINATION:  Blood pressure (!) 150/89, pulse (!) 104, temperature (!) 97.5 F (36.4 C), temperature source Temporal, resp. rate 17, weight 178 lb 11.2 oz (81.1 kg), SpO2 99%.  ECOG PERFORMANCE STATUS: 1  Physical Exam  Constitutional: Oriented to person, place, and time and well-developed, well-nourished, and in no distress. HENT:  Head: Normocephalic and atraumatic.  Mouth/Throat: Oropharynx is clear and moist. No oropharyngeal exudate.  Eyes: Conjunctivae are normal. Right eye exhibits no discharge. Left eye exhibits no discharge. No scleral icterus.  Neck: Normal range of motion. Neck supple.  Cardiovascular: Normal rate, regular rhythm, normal heart sounds and intact distal pulses.   Pulmonary/Chest: Effort normal and breath sounds normal. No respiratory distress. No wheezes. No rales.  Abdominal: Soft. Bowel sounds are normal. Exhibits no distension and no mass. There is no tenderness.  Musculoskeletal: Normal range of motion. Exhibits no edema.   Lymphadenopathy:    No cervical adenopathy.  Neurological: Alert and oriented to person, place, and time. Exhibits normal muscle tone. Gait normal. Coordination normal.  Skin: Skin is warm and dry. No rash noted. Not diaphoretic. No erythema. No pallor.  Psychiatric: Mood, memory and judgment normal.  Vitals reviewed.  LABORATORY DATA: Lab Results  Component Value Date   WBC 4.5 09/15/2023   HGB 10.3 (L) 09/15/2023   HCT 33.5 (  L) 09/15/2023   MCV 80.1 09/15/2023   PLT 169 09/15/2023      Chemistry      Component Value Date/Time   NA 140 08/18/2023 1334   K 4.6 08/18/2023 1334   CL 105 08/18/2023 1334   CO2 29 08/18/2023 1334   BUN 17 08/18/2023 1334   CREATININE 1.13 (H) 08/18/2023 1334   CREATININE 1.07 (H) 09/16/2019 1511      Component Value Date/Time   CALCIUM  9.3 08/18/2023 1334   ALKPHOS 74 08/18/2023 1334   AST 12 (L) 08/18/2023 1334   ALT 9 08/18/2023 1334   BILITOT 0.5 08/18/2023 1334       RADIOGRAPHIC STUDIES:  No results found.   ASSESSMENT/PLAN:  This is a very pleasant 78 year old African-American female with stage IIIa (T1c, N2, M0) non-small cell lung cancer, adenocarcinoma.  She presented with a right lower lobe lung nodule in addition to right paratracheal lymphadenopathy.  She was diagnosed in February 2025.  Her molecular studies by [email or web address] showed no actionable mutations and a negative PD-L1 expression.  The patient has a history of rheumatoid arthritis and is currently on treatment with Plaquenil .  The patient underwent a course of concurrent chemoradiation with weekly carboplatin  for an AUC of 2 and paclitaxel  45 mg/m.  The patient's first dose was on 05/04/2023.  She is status post 7 cycles of treatment.  The last dose was on 06/15/2023.  She is currently on consolidation immunotherapy with Imfinzi  5000 mg IV every 4 weeks.  She status post 2 cycles.  Labs were reviewed.  Recommend that she proceed with cycle number 3 today as  scheduled.  We will see her back for labs and a follow-up visit in 4 weeks for evaluation repeat blood work before undergoing cycle #4.  I will arrange for a restaging CT scan of the chest prior to her next cycle of treatment.   Advised to take antihistamine and avoid foods that cause rash.   She forgot to take her anti-hypertensive this morning. She will take it when she returns home. Ok to treat with her BP today.   The patient was advised to call immediately if she has any concerning symptoms in the interval. The patient voices understanding of current disease status and treatment options and is in agreement with the current care plan. All questions were answered. The patient knows to call the clinic with any problems, questions or concerns. We can certainly see the patient much sooner if necessary      Orders Placed This Encounter  Procedures   CT Chest W Contrast    Standing Status:   Future    Expected Date:   10/08/2023    Expiration Date:   09/14/2024    If indicated for the ordered procedure, I authorize the administration of contrast media per Radiology protocol:   Yes    Does the patient have a contrast media/X-ray dye allergy?:   No    Preferred imaging location?:   [facility]    The total time spent in the appointment was 20-29 minutes  Stepahnie Campo L Sterlin Knightly, PA-C 09/15/23

## 2023-09-15 ENCOUNTER — Inpatient Hospital Stay

## 2023-09-15 ENCOUNTER — Inpatient Hospital Stay (HOSPITAL_BASED_OUTPATIENT_CLINIC_OR_DEPARTMENT_OTHER): Admitting: Physician Assistant

## 2023-09-15 ENCOUNTER — Inpatient Hospital Stay: Attending: Internal Medicine

## 2023-09-15 VITALS — HR 92

## 2023-09-15 VITALS — BP 150/89 | HR 104 | Temp 97.5°F | Resp 17 | Wt 178.7 lb

## 2023-09-15 DIAGNOSIS — R0602 Shortness of breath: Secondary | ICD-10-CM | POA: Insufficient documentation

## 2023-09-15 DIAGNOSIS — R197 Diarrhea, unspecified: Secondary | ICD-10-CM | POA: Diagnosis not present

## 2023-09-15 DIAGNOSIS — Z79899 Other long term (current) drug therapy: Secondary | ICD-10-CM | POA: Diagnosis not present

## 2023-09-15 DIAGNOSIS — R21 Rash and other nonspecific skin eruption: Secondary | ICD-10-CM | POA: Insufficient documentation

## 2023-09-15 DIAGNOSIS — Z886 Allergy status to analgesic agent status: Secondary | ICD-10-CM | POA: Diagnosis not present

## 2023-09-15 DIAGNOSIS — E119 Type 2 diabetes mellitus without complications: Secondary | ICD-10-CM | POA: Diagnosis not present

## 2023-09-15 DIAGNOSIS — Z885 Allergy status to narcotic agent status: Secondary | ICD-10-CM | POA: Diagnosis not present

## 2023-09-15 DIAGNOSIS — Z5112 Encounter for antineoplastic immunotherapy: Secondary | ICD-10-CM | POA: Diagnosis not present

## 2023-09-15 DIAGNOSIS — Z95828 Presence of other vascular implants and grafts: Secondary | ICD-10-CM

## 2023-09-15 DIAGNOSIS — R0609 Other forms of dyspnea: Secondary | ICD-10-CM | POA: Insufficient documentation

## 2023-09-15 DIAGNOSIS — Z923 Personal history of irradiation: Secondary | ICD-10-CM | POA: Insufficient documentation

## 2023-09-15 DIAGNOSIS — M069 Rheumatoid arthritis, unspecified: Secondary | ICD-10-CM | POA: Diagnosis not present

## 2023-09-15 DIAGNOSIS — C3431 Malignant neoplasm of lower lobe, right bronchus or lung: Secondary | ICD-10-CM | POA: Diagnosis not present

## 2023-09-15 DIAGNOSIS — K5900 Constipation, unspecified: Secondary | ICD-10-CM | POA: Diagnosis not present

## 2023-09-15 DIAGNOSIS — I10 Essential (primary) hypertension: Secondary | ICD-10-CM | POA: Insufficient documentation

## 2023-09-15 DIAGNOSIS — R591 Generalized enlarged lymph nodes: Secondary | ICD-10-CM | POA: Diagnosis not present

## 2023-09-15 LAB — CMP (CANCER CENTER ONLY)
ALT: 8 U/L (ref 0–44)
AST: 12 U/L — ABNORMAL LOW (ref 15–41)
Albumin: 3.2 g/dL — ABNORMAL LOW (ref 3.5–5.0)
Alkaline Phosphatase: 77 U/L (ref 38–126)
Anion gap: 5 (ref 5–15)
BUN: 16 mg/dL (ref 8–23)
CO2: 27 mmol/L (ref 22–32)
Calcium: 8.9 mg/dL (ref 8.9–10.3)
Chloride: 107 mmol/L (ref 98–111)
Creatinine: 0.77 mg/dL (ref 0.44–1.00)
GFR, Estimated: >60 mL/min (ref >60)
Glucose, Bld: 250 mg/dL — ABNORMAL HIGH (ref 70–99)
Potassium: 4.2 mmol/L (ref 3.5–5.1)
Sodium: 139 mmol/L (ref 135–145)
Total Bilirubin: 0.5 mg/dL (ref 0.0–1.2)
Total Protein: 7.2 g/dL (ref 6.5–8.1)

## 2023-09-15 LAB — CBC WITH DIFFERENTIAL (CANCER CENTER ONLY)
Abs Immature Granulocytes: 0.05 K/uL (ref 0.00–0.07)
Basophils Absolute: 0 K/uL (ref 0.0–0.1)
Basophils Relative: 0 %
Eosinophils Absolute: 0.1 K/uL (ref 0.0–0.5)
Eosinophils Relative: 1 %
HCT: 33.5 % — ABNORMAL LOW (ref 36.0–46.0)
Hemoglobin: 10.3 g/dL — ABNORMAL LOW (ref 12.0–15.0)
Immature Granulocytes: 1 %
Lymphocytes Relative: 16 %
Lymphs Abs: 0.7 K/uL (ref 0.7–4.0)
MCH: 24.6 pg — ABNORMAL LOW (ref 26.0–34.0)
MCHC: 30.7 g/dL (ref 30.0–36.0)
MCV: 80.1 fL (ref 80.0–100.0)
Monocytes Absolute: 0.7 K/uL (ref 0.1–1.0)
Monocytes Relative: 16 %
Neutro Abs: 3 K/uL (ref 1.7–7.7)
Neutrophils Relative %: 66 %
Platelet Count: 169 K/uL (ref 150–400)
RBC: 4.18 MIL/uL (ref 3.87–5.11)
RDW: 18.7 % — ABNORMAL HIGH (ref 11.5–15.5)
WBC Count: 4.5 K/uL (ref 4.0–10.5)
nRBC: 0 % (ref 0.0–0.2)

## 2023-09-15 LAB — TSH: TSH: 0.666 u[IU]/mL (ref 0.350–4.500)

## 2023-09-15 MED ORDER — SODIUM CHLORIDE 0.9 % IV SOLN
1500.0000 mg | Freq: Once | INTRAVENOUS | Status: AC
  Administered 2023-09-15: 1500 mg via INTRAVENOUS
  Filled 2023-09-15: qty 30

## 2023-09-15 MED ORDER — SODIUM CHLORIDE 0.9 % IV SOLN: INTRAVENOUS | Status: DC

## 2023-09-15 MED ORDER — SODIUM CHLORIDE 0.9% FLUSH
10.0000 mL | Freq: Once | INTRAVENOUS | Status: AC
  Administered 2023-09-15: 10 mL

## 2023-09-15 MED ORDER — HEPARIN SOD (PORK) LOCK FLUSH 100 UNIT/ML IV SOLN
500.0000 [IU] | Freq: Once | INTRAVENOUS | Status: AC | PRN
  Administered 2023-09-15: 500 [IU]

## 2023-09-15 MED ORDER — SODIUM CHLORIDE 0.9% FLUSH
10.0000 mL | INTRAVENOUS | Status: DC | PRN
  Administered 2023-09-15: 10 mL

## 2023-09-15 NOTE — Patient Instructions (Addendum)

## 2023-09-16 LAB — T4: T4, Total: 6.7 ug/dL (ref 4.5–12.0)

## 2023-10-02 ENCOUNTER — Encounter: Payer: Self-pay | Admitting: Internal Medicine

## 2023-10-07 NOTE — Progress Notes (Signed)
 Office Visit Note  Patient: Wendy [name]             Date of Birth: 1945/09/16           MRN: [id number]             PCP: [name] LABOR, MD Referring: Lanell Donald Stagger,* Visit Date: 10/16/2023 Occupation: @GUAROCC @  Subjective:  Medication management  History of Present Illness: Wendy [name] is a 78 y.o. female seen for the management of rheumatoid arthritis.  According the patient she was diagnosed with rheumatoid arthritis by Dr. Ishmael in 2008 when she developed hand pain and hand swelling.  She states Dr. Werner started her on Plaquenil  and her symptoms improved.  Dr. Lavell moved out of the practice and since then she has been getting Plaquenil  through Dr. Johnny.  She states she has been doing well on Plaquenil  200 mg p.o. daily.  She states she has not had eye exam in a long time.  She complains of some discomfort in her shoulders but none of the other joints are painful.  She has not noticed any joint swelling.  She was having lower back pain in the past which improved after the lumbar spine surgery.  She was diagnosed with lung cancer in February 2025.  She states she had chemotherapy followed by radiation therapy which she tolerated well.  She is retired.  She is right-handed and used to work as a Lawyer.  She is widowed, gravida 7, para 2, miscarriages 2, abortion 3.  This history of preeclampsia.  There is no history of DVTs.  She likes to play games on her cell phone.  She also walks for exercise.  She does not drink any alcohol.  She smoked half pack/day for 40 years.  She quit smoking in 2023.  There is no family history of autoimmune disease.    Activities of Daily Living:  Patient reports morning stiffness for 10-20 minutes.   Patient Reports nocturnal pain.  Difficulty dressing/grooming: Reports Difficulty climbing stairs: Reports Difficulty getting out of chair: Reports Difficulty using hands for taps, buttons, cutlery, and/or writing: Reports  Review of Systems   Constitutional:  Positive for fatigue.  HENT:  Positive for mouth dryness. Negative for mouth sores.   Eyes:  Positive for dryness.  Respiratory:  Negative for shortness of breath.   Cardiovascular:  Positive for chest pain. Negative for palpitations.  Gastrointestinal:  Negative for blood in stool, constipation and diarrhea.  Endocrine: Negative for increased urination.  Genitourinary:  Negative for involuntary urination.  Musculoskeletal:  Positive for joint pain, gait problem, joint pain, joint swelling, myalgias, muscle weakness, morning stiffness, muscle tenderness and myalgias.  Skin:  Positive for rash. Negative for color change and sensitivity to sunlight.  Allergic/Immunologic: Negative for susceptible to infections.  Neurological:  Negative for dizziness and headaches.  Hematological:  Negative for swollen glands.  Psychiatric/Behavioral:  Positive for depressed mood and sleep disturbance. The patient is nervous/anxious.     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   FOOT PAIN  05/23/2009   DEPENDENT EDEMA 07/21/2007   HEPATITIS C 02/08/2007   GERD 11/20/2006   Diabetic neuropathy (HCC) 11/05/2006   Essential hypertension 11/05/2006   Rheumatoid arthritis (HCC) 11/05/2006   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    Past Medical History:  Diagnosis Date   Allergy    year around   Anemia    long time ago    Anxiety    Arthritis    RA    Cataract    removed  both dr effie-    Complication of anesthesia    DEPENDENT  EDEMA 07/21/2007   DIABETES MELLITUS, TYPE II 11/05/2006   FOOT PAIN 05/23/2009   GERD 11/20/2006   Hepatitis    HEPATITIS C 02/08/2007   sees GI @ [facility]   Hyperlipidemia    HYPERTENSION 11/05/2006   LOW BACK PAIN SYNDROME 07/21/2007   Neuromuscular disorder (HCC)    neuropathy legs, feet    PONV (postoperative nausea and vomiting)    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 Jon Jacob     Family History  Problem Relation Age of Onset   Lung cancer Mother    Heart Problems Father    Pancreatic cancer Sister    Lung cancer Sister    Congestive Heart Failure Sister    Cancer Brother    Diabetes Brother    Other Brother        dehydration   Arthritis Other    Diabetes Other    Hypertension Other    Cancer Other        lung   Healthy Son    Breast cancer Daughter    Colon cancer Neg Hx    Colon polyps Neg Hx    Esophageal cancer Neg Hx    Rectal cancer Neg Hx    Stomach cancer Neg Hx    Past Surgical History:  Procedure Laterality Date   BRONCHIAL BRUSHINGS  04/07/2023   Procedure: BRONCHIAL BRUSHINGS;  Surgeon: Shelah Lamar RAMAN, MD;  Location: [facility] ENDOSCOPY;  Service: Pulmonary;;   BRONCHIAL NEEDLE ASPIRATION BIOPSY  04/07/2023   Procedure: BRONCHIAL NEEDLE ASPIRATION BIOPSIES;  Surgeon: Shelah Lamar RAMAN, MD;  Location: [facility] ENDOSCOPY;  Service: Pulmonary;;   CATARACT EXTRACTION     COLONOSCOPY  11/23/2019   per Dr. Teressa, adenmatous polyp, repeat in 7 yrs   ESOPHAGOGASTRODUODENOSCOPY  06/20/2008   at [facility], clear    FINE NEEDLE ASPIRATION  04/07/2023   Procedure: FINE NEEDLE ASPIRATION (FNA) LINEAR;  Surgeon: Shelah Lamar RAMAN, MD;  Location: [facility] ENDOSCOPY;  Service: Pulmonary;;   IR IMAGING GUIDED PORT INSERTION  04/30/2023   LUMBAR LAMINECTOMY     1995   ROTATOR CUFF REPAIR Right    SPINAL FUSION N/A 07/29/2022   Procedure: T10-PELVIS INSTRUMENTATION/ T10-L4 POSTERIOR SPINAL FUSION;  Surgeon: Georgina Ozell LABOR, MD;  Location: [facility] OR;  Service: Orthopedics;  Laterality: N/A;   UPPER GASTROINTESTINAL ENDOSCOPY  2010   [facility]    VIDEO BRONCHOSCOPY WITH ENDOBRONCHIAL ULTRASOUND N/A 04/07/2023   Procedure: VIDEO BRONCHOSCOPY WITH ENDOBRONCHIAL ULTRASOUND;  Surgeon: Shelah Lamar RAMAN, MD;  Location: [facility] ENDOSCOPY;  Service: Pulmonary;  Laterality: N/A;   Social History   Social History Narrative   ** Merged History Encounter **       Immunization History  Administered Date(s) Administered   Fluad Quad(high Dose 65+) 12/25/2018, 12/16/2019, 01/02/2021   Fluad Trivalent(High Dose 65+) 01/21/2023   Influenza Whole 02/08/2007, 12/01/2008  Influenza, High Dose Seasonal PF 01/07/2016, 12/17/2016, 12/21/2017   Influenza,inj,Quad PF,6+ Mos 12/21/2012, 10/26/2013   Influenza-Unspecified 12/17/2016   Moderna Covid-19 Vaccine Bivalent Booster 50yrs & up 01/05/2021   Moderna SARS-COV2 Booster Vaccination 02/06/2020, 06/19/2020   Moderna Sars-Covid-2 Vaccination 05/13/2019, 06/13/2019   Pneumococcal Conjugate-13 01/07/2016   Pneumococcal Polysaccharide-23 12/17/2016   Tdap 03/06/2021   Zoster Recombinant(Shingrix) 03/06/2021, 05/06/2021     Objective: Vital Signs: BP 117/76 (BP Location: Right Arm, Patient Position: Sitting, Cuff Size: Normal)   Pulse 88   Resp 17   Ht 5' 3 (1.6 m)   Wt 182 lb (82.6 kg)   BMI 32.24 kg/m    Physical Exam Vitals and nursing note reviewed.  Constitutional:      Appearance: She is well-developed.  HENT:     Head: Normocephalic and atraumatic.  Eyes:     Conjunctiva/sclera: Conjunctivae normal.  Cardiovascular:     Rate and Rhythm: Normal rate and regular rhythm.     Heart sounds: Normal heart sounds.  Pulmonary:     Effort: Pulmonary effort is normal.     Breath sounds: Normal breath sounds.  Abdominal:     General: Bowel sounds are normal.     Palpations: Abdomen is soft.  Musculoskeletal:     Cervical back: Normal range of motion.   Lymphadenopathy:     Cervical: No cervical adenopathy.  Skin:    General: Skin is warm and dry.     Capillary Refill: Capillary refill takes less than 2 seconds.  Neurological:     Mental Status: She is alert and oriented to person, place, and time.  Psychiatric:        Behavior: Behavior normal.      Musculoskeletal Exam: Cervical spine was in good range of motion.  She had limited range of motion of the lumbar spine with discomfort.  Shoulders, elbows, wrist, MCPs PIPs and DIPs Juengel range of motion with no synovitis.  Hip joints, knee joints, ankles, MTPs and PIPs with good range of motion with no synovitis.  CDAI Exam: CDAI Score: -- Patient Global: 10 / 100; Provider Global: 10 / 100 Swollen: --; Tender: -- Joint Exam 10/16/2023   No joint exam has been documented for this visit   There is currently no information documented on the homunculus. Go to the Rheumatology activity and complete the homunculus joint exam.  Investigation: No additional findings.  Imaging: CT Chest W Contrast Result Date: 10/13/2023 CLINICAL DATA:  Non-small cell lung cancer (NSCLC), non-metastatic, assess treatment response * Tracking Code: BO * EXAM: CT CHEST WITH CONTRAST TECHNIQUE: Multidetector CT imaging of the chest was performed during intravenous contrast administration. RADIATION DOSE REDUCTION: This exam was performed according to the departmental dose-optimization program which includes automated exposure control, adjustment of the mA and/or kV according to patient size and/or use of iterative reconstruction technique. CONTRAST:  75mL OMNIPAQUE  IOHEXOL  300 MG/ML  SOLN COMPARISON:  Chest CT 07/03/2023 and 04/01/2023.  PET-CT 02/05/2023. FINDINGS: Cardiovascular: No acute vascular findings. Mild atherosclerosis of the aorta, great vessels and coronary arteries. Right IJ Port-A-Cath extends to the superior cavoatrial junction. The heart size is normal. There is no pericardial effusion.  Mediastinum/Nodes: No residual enlarged mediastinal, hilar or axillary lymph nodes. Previously demonstrated right paratracheal node has decreased in size, measuring 7 mm short axis on image 48/2 (previously 14 mm, remeasured in the same plane). The thyroid  gland, trachea and esophagus demonstrate no significant findings. Lungs/Pleura: Treated lesion in the superior segment of the right lower  lobe is partially obscured by new bandlike opacities consistent with radiation therapy, although appears decreased in size, measuring approximately 1.5 x 1.2 cm on image 62/7 (previously 1.6 x 1.4 cm). Surrounding bandlike opacities, volume loss and air bronchograms extend into the central perihilar portions of the right upper, middle and lower lobes, consistent with interval radiation therapy. Stable mild left upper lobe nodularity along the aortic arch, measuring 5 mm on image 129/6 unchanged from priors dating back to 01/10/2023. No new or enlarging nodules are identified. Upper abdomen: Small hiatal hernia. The visualized upper abdomen otherwise appears unremarkable. Musculoskeletal/Chest wall: There is a new 1.3 cm lytic lesion anteriorly in the T8 vertebral body, best seen on sagittal image 111/6. No other lytic lesions are identified. No evidence of discitis or osteomyelitis. Patient is status post thoracolumbar fusion extending inferiorly from T10. IMPRESSION: 1. Interval decrease in size of treated lesion in the superior segment of the right lower lobe with new surrounding radiation changes. 2. No residual enlarged mediastinal, hilar or axillary lymph nodes. 3. New lytic lesion in the T8 vertebral body, suspicious for metastatic disease. Consider further evaluation with MRI of the thoracic spine. 4. Stable small left upper lobe pulmonary nodule, likely benign based on stability. Continued attention on follow-up recommended. 5.  Aortic Atherosclerosis (ICD10-I70.0). Electronically Signed   By: Elsie Perone M.D.   On:  10/13/2023 10:05    Recent Labs: Lab Results  Component Value Date   WBC 5.3 10/13/2023   HGB 10.5 (L) 10/13/2023   PLT 183 10/13/2023   NA 140 10/13/2023   K 3.9 10/13/2023   CL 108 10/13/2023   CO2 28 10/13/2023   GLUCOSE 64 (L) 10/13/2023   BUN 9 10/13/2023   CREATININE 0.78 10/13/2023   BILITOT 0.4 10/13/2023   ALKPHOS 75 10/13/2023   AST 15 10/13/2023   ALT 9 10/13/2023   PROT 7.3 10/13/2023   ALBUMIN  3.5 10/13/2023   CALCIUM  8.9 10/13/2023   GFRAA >60 12/24/2016    Speciality Comments: No specialty comments available.  Procedures:  No procedures performed Allergies: Aspirin and Tramadol   Assessment / Plan:     Visit Diagnoses: Rheumatoid arthritis, involving unspecified site, unspecified whether rheumatoid factor present [facility]) -patient states she was diagnosed with rheumatoid arthritis in 2008 by Dr. Ishmael.  At the time she was experiencing pain and swelling in her hands.  She has been on hydroxychloroquine  since then.  She has been prescribed hydroxychloroquine  by Dr. Johnny.  She denies any joint swelling or pain today.  She states she has intermittent discomfort in her hands and her feet.  No synovitis was noted on the examination today.  Needle counts regarding hydroxychloroquine  was provided.  A handout was given and consent was taken.  Patient states she has not had eye examination in a while.  She has seen Dr. Waylan in the past.  I advised her to schedule an appointment Dr. Waylan for Plaquenil  eye examination.  She is currently on Plaquenil  200 mg p.o. daily.  Plan: Rheumatoid factor, Cyclic citrul peptide antibody, IgG, ANA, Sedimentation rate  Patient was counseled on the purpose, proper use, and adverse effects of hydroxychloroquine  including nausea/diarrhea, skin rash, headaches, and sun sensitivity.  Advised patient to wear sunscreen once starting hydroxychloroquine  to reduce risk of rash associated with sun sensitivity.  Discussed importance of annual eye exams  while on hydroxychloroquine  to monitor to ocular toxicity and discussed importance of frequent laboratory monitoring.  Provided patient with eye exam form for baseline  ophthalmologic exam.  Provided patient with educational materials on hydroxychloroquine  and answered all questions.  Patient consented to hydroxychloroquine . Will upload consent in the media tab.    Reviewed risk for QTC prolongation when used in combination with other QTc prolonging agents (including but not limited to antiarrhythmics, macrolide antibiotics, flouroquinolone antibiotics, haloperidol, quetiapine, olanzapine, risperidone, droperidol, ziprasidone, amitriptyline, citalopram, ondansetron , migraine triptans, and methadone).    High risk medication use -Plaquenil  200 mg p.o. daily.  Plan: Glucose 6 phosphate dehydrogenase.  Will check labs today and then every 5 months.  Annual eye examination to monitor for ocular toxicity was realized.  Information reimmunization was placed in the AVS.  Pain in both hands -she gives history of intermittent discomfort in her hands.  She states she has not had significant pain in her hands in a long time.  Plan: XR Hand 2 View Right, XR Hand 2 View Left.  X-rays were suggestive of osteoarthritis.  Pain in both feet -she gets intermittent discomfort in her feet.  Plan: XR Foot 2 Views Right, XR Foot 2 Views Left.  X-rays of bilateral feet were suggestive of osteoarthritis.  S/P lumbar fusion-patient states her lower back pain is much better since the lumbar spine fusion.  HEPATITIS C - treated per patient.  Malignant neoplasm of bronchus of right lower lobe (HCC) - 02/25, s/p CTX and RTX per patient.  Type 2 diabetes mellitus treated without insulin  (HCC)  Diabetic polyneuropathy associated with type 2 diabetes mellitus (HCC)  Gastroesophageal reflux disease without esophagitis  Essential hypertension-blood sugar was normal at 117/76.  Sensorineural hearing loss, bilateral  Former  smoker - 1/2 PPD x 40 years. Quit smoking 2023.  Orders: Orders Placed This Encounter  Procedures   XR Hand 2 View Right   XR Hand 2 View Left   XR Foot 2 Views Right   XR Foot 2 Views Left   Rheumatoid factor   Cyclic citrul peptide antibody, IgG   ANA   Glucose 6 phosphate dehydrogenase   Sedimentation rate   No orders of the defined types were placed in this encounter.   Face-to-face time spent with patient was over 45 minutes. Greater than 50% of time was spent in counseling and coordination of care.  Follow-Up Instructions: Return for Rheumatoid arthritis.   Maya Nash, MD  Note - This record has been created using Animal nutritionist.  Chart creation errors have been sought, but may not always  have been located. Such creation errors do not reflect on  the standard of medical care.

## 2023-10-08 ENCOUNTER — Ambulatory Visit (HOSPITAL_COMMUNITY)
Admission: RE | Admit: 2023-10-08 | Discharge: 2023-10-08 | Disposition: A | Discharge status: Home / Self Care | Source: Ambulatory Visit | Attending: Physician Assistant | Admitting: Physician Assistant

## 2023-10-08 DIAGNOSIS — I700 Atherosclerosis of aorta: Secondary | ICD-10-CM | POA: Diagnosis not present

## 2023-10-08 DIAGNOSIS — K449 Diaphragmatic hernia without obstruction or gangrene: Secondary | ICD-10-CM | POA: Diagnosis not present

## 2023-10-08 DIAGNOSIS — C3431 Malignant neoplasm of lower lobe, right bronchus or lung: Secondary | ICD-10-CM | POA: Insufficient documentation

## 2023-10-08 DIAGNOSIS — C3490 Malignant neoplasm of unspecified part of unspecified bronchus or lung: Secondary | ICD-10-CM | POA: Diagnosis not present

## 2023-10-08 MED ORDER — IOHEXOL 300 MG/ML  SOLN
75.0000 mL | Freq: Once | INTRAMUSCULAR | Status: AC | PRN
  Administered 2023-10-08: 75 mL via INTRAVENOUS

## 2023-10-13 ENCOUNTER — Inpatient Hospital Stay: Attending: Internal Medicine

## 2023-10-13 ENCOUNTER — Inpatient Hospital Stay: Admitting: Internal Medicine

## 2023-10-13 ENCOUNTER — Inpatient Hospital Stay

## 2023-10-13 VITALS — BP 140/77 | HR 93 | Temp 97.6°F | Resp 16 | Ht 63.0 in | Wt 186.0 lb

## 2023-10-13 VITALS — BP 117/75 | HR 85 | Resp 16

## 2023-10-13 DIAGNOSIS — Z886 Allergy status to analgesic agent status: Secondary | ICD-10-CM | POA: Insufficient documentation

## 2023-10-13 DIAGNOSIS — Z923 Personal history of irradiation: Secondary | ICD-10-CM | POA: Insufficient documentation

## 2023-10-13 DIAGNOSIS — C3431 Malignant neoplasm of lower lobe, right bronchus or lung: Secondary | ICD-10-CM

## 2023-10-13 DIAGNOSIS — K449 Diaphragmatic hernia without obstruction or gangrene: Secondary | ICD-10-CM | POA: Diagnosis not present

## 2023-10-13 DIAGNOSIS — Z885 Allergy status to narcotic agent status: Secondary | ICD-10-CM | POA: Insufficient documentation

## 2023-10-13 DIAGNOSIS — Z800 Family history of malignant neoplasm of digestive organs: Secondary | ICD-10-CM | POA: Diagnosis not present

## 2023-10-13 DIAGNOSIS — M069 Rheumatoid arthritis, unspecified: Secondary | ICD-10-CM | POA: Diagnosis not present

## 2023-10-13 DIAGNOSIS — Z95828 Presence of other vascular implants and grafts: Secondary | ICD-10-CM

## 2023-10-13 DIAGNOSIS — Z9221 Personal history of antineoplastic chemotherapy: Secondary | ICD-10-CM | POA: Insufficient documentation

## 2023-10-13 DIAGNOSIS — I10 Essential (primary) hypertension: Secondary | ICD-10-CM | POA: Insufficient documentation

## 2023-10-13 DIAGNOSIS — Z5112 Encounter for antineoplastic immunotherapy: Secondary | ICD-10-CM | POA: Insufficient documentation

## 2023-10-13 DIAGNOSIS — E119 Type 2 diabetes mellitus without complications: Secondary | ICD-10-CM | POA: Diagnosis not present

## 2023-10-13 DIAGNOSIS — I700 Atherosclerosis of aorta: Secondary | ICD-10-CM | POA: Diagnosis not present

## 2023-10-13 DIAGNOSIS — Z79899 Other long term (current) drug therapy: Secondary | ICD-10-CM | POA: Diagnosis not present

## 2023-10-13 LAB — CBC WITH DIFFERENTIAL (CANCER CENTER ONLY)
Abs Immature Granulocytes: 0.06 K/uL (ref 0.00–0.07)
Basophils Absolute: 0 K/uL (ref 0.0–0.1)
Basophils Relative: 1 %
Eosinophils Absolute: 0 K/uL (ref 0.0–0.5)
Eosinophils Relative: 1 %
HCT: 34.2 % — ABNORMAL LOW (ref 36.0–46.0)
Hemoglobin: 10.5 g/dL — ABNORMAL LOW (ref 12.0–15.0)
Immature Granulocytes: 1 %
Lymphocytes Relative: 17 %
Lymphs Abs: 0.9 K/uL (ref 0.7–4.0)
MCH: 24.2 pg — ABNORMAL LOW (ref 26.0–34.0)
MCHC: 30.7 g/dL (ref 30.0–36.0)
MCV: 79 fL — ABNORMAL LOW (ref 80.0–100.0)
Monocytes Absolute: 1 K/uL (ref 0.1–1.0)
Monocytes Relative: 19 %
Neutro Abs: 3.3 K/uL (ref 1.7–7.7)
Neutrophils Relative %: 61 %
Platelet Count: 183 K/uL (ref 150–400)
RBC: 4.33 MIL/uL (ref 3.87–5.11)
RDW: 19.3 % — ABNORMAL HIGH (ref 11.5–15.5)
WBC Count: 5.3 K/uL (ref 4.0–10.5)
nRBC: 0 % (ref 0.0–0.2)

## 2023-10-13 LAB — CMP (CANCER CENTER ONLY)
ALT: 9 U/L (ref 0–44)
AST: 15 U/L (ref 15–41)
Albumin: 3.5 g/dL (ref 3.5–5.0)
Alkaline Phosphatase: 75 U/L (ref 38–126)
Anion gap: 4 — ABNORMAL LOW (ref 5–15)
BUN: 9 mg/dL (ref 8–23)
CO2: 28 mmol/L (ref 22–32)
Calcium: 8.9 mg/dL (ref 8.9–10.3)
Chloride: 108 mmol/L (ref 98–111)
Creatinine: 0.78 mg/dL (ref 0.44–1.00)
GFR, Estimated: >60 mL/min (ref >60)
Glucose, Bld: 64 mg/dL — ABNORMAL LOW (ref 70–99)
Potassium: 3.9 mmol/L (ref 3.5–5.1)
Sodium: 140 mmol/L (ref 135–145)
Total Bilirubin: 0.4 mg/dL (ref 0.0–1.2)
Total Protein: 7.3 g/dL (ref 6.5–8.1)

## 2023-10-13 MED ORDER — SODIUM CHLORIDE 0.9 % IV SOLN
1500.0000 mg | Freq: Once | INTRAVENOUS | Status: AC
  Administered 2023-10-13 (×2): 1500 mg via INTRAVENOUS
  Filled 2023-10-13: qty 30

## 2023-10-13 MED ORDER — SODIUM CHLORIDE 0.9% FLUSH
10.0000 mL | Freq: Once | INTRAVENOUS | Status: AC
Start: 2023-10-13 — End: 2023-10-13
  Administered 2023-10-13 (×2): 10 mL

## 2023-10-13 MED ORDER — SODIUM CHLORIDE 0.9 % IV SOLN: INTRAVENOUS | Status: DC

## 2023-10-13 NOTE — Progress Notes (Signed)
 [facility] [phone]   [phone]) [phone]  OFFICE PROGRESS NOTE  Wendy [name] LABOR, MD [address] [place] [address]  DIAGNOSIS:   1) Stage IIIa (T1c, N2, M0) non-small cell lung cancer, adenocarcinoma presented with right lower lobe lung nodule in addition to right paratracheal lymphadenopathy diagnosed in February 2025.  Molecular studies by [email or web address] showed no actionable mutations and negative PD-L1 expression.   2) history of rheumatoid arthritis and currently on treatment with Plaquenil .  PRIOR THERAPY: A course of concurrent chemoradiation with weekly carboplatin  for AUC of 2 and paclitaxel  45 Mg/M2.  First dose May 04, 2023.  Status post 7 cycles.  Last dose was given on 06/15/2023.  CURRENT THERAPY: Consolidation treatment with immunotherapy with Imfinzi  1500 Mg IV every 4 weeks status post 3 cycles .  INTERVAL HISTORY: Wendy [name] 78 y.o. female returns to the clinic today for follow-up visit accompanied by her daughter.  Discussed the use of AI scribe software for clinical note transcription with the patient, who gave verbal consent to proceed.  History of Present Illness Wendy [name] is a 78 year old female with stage 3A non-small cell lung cancer who presents for evaluation before starting cycle number four of immunotherapy. She is accompanied by her daughter.  She was diagnosed with stage 3A non-small cell lung cancer, adenocarcinoma, in February 2025. She has completed concurrent chemoradiation with weekly carboplatin  and paclitaxel  and is currently undergoing consolidation treatment with immunotherapy, receiving 1500 mg IV every four weeks. She has undergone a repeat CT scan of the chest for restaging.  She experiences weakness in her legs, which she attributes to her rheumatoid arthritis. She is currently taking Plaquenil  for this condition. She has a history of back surgery, and recent imaging has shown a lytic  lesion at the T8 vertebra. There is uncertainty whether this lesion is related to her arthritis or her lung cancer. Her sister died yesterday from complication of pancreatic cancer.    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 Jon Jacob     ALLERGIES:  is allergic to aspirin and tramadol.  MEDICATIONS:  Current Outpatient Medications  Medication Sig Dispense Refill   ACCU-CHEK GUIDE TEST test strip USE TO TEST ONCE DAILY 100 strip 1   Accu-Chek Softclix Lancets lancets Test once per day and diagnosis code is E 11.9 and dispense for (ACCU-CHEK Soft Touch) 100 each 12   ALPRAZolam  (XANAX ) 1 MG tablet TAKE 1 TABLET (1 MG TOTAL) BY MOUTH AT BEDTIME AS NEEDED FOR ANXIETY OR SLEEP. 30 tablet 5   amLODipine  (NORVASC ) 10 MG tablet Take 1 tablet (10 mg total) by mouth daily. 90 tablet  3   atorvastatin  (LIPITOR) 10 MG tablet TAKE 1 TABLET BY MOUTH EVERY DAY 90 tablet 3   Blood Glucose Monitoring Suppl (ACCU-CHEK GUIDE) w/Device KIT Use to test sugars daily. 1 kit 0   Blood Glucose Monitoring Suppl DEVI 1 each by Does not apply route in the morning, at noon, and at bedtime. May substitute to any manufacturer covered by patient's insurance. 1 each 0   Blood Pressure Monitor KIT Use to check blood pressure. 1 kit 0   Cholecalciferol (VITAMIN D3) 20 MCG (800 UNIT) TABS Take 800  Units by mouth daily.     DULoxetine  (CYMBALTA ) 30 MG capsule Take 1 capsule (30 mg total) by mouth at bedtime.     fluticasone  (FLONASE ) 50 MCG/ACT nasal spray SPRAY 2 SPRAYS INTO EACH NOSTRIL EVERY DAY 48 mL 3   furosemide  (LASIX ) 20 MG tablet Take 1 tablet (20 mg total) by mouth daily as needed (fluid retention). 90 tablet 3   Ginkgo Biloba (GNP GINGKO BILOBA EXTRACT PO) Take by mouth.     glipiZIDE  (GLUCOTROL ) 5 MG tablet Take 1 tablet (5 mg total) by mouth 2 (two) times daily before a meal. 180 tablet 3   HYDROcodone -acetaminophen  (NORCO) 10-325 MG tablet Take 1 tablet by mouth every 6 (six) hours as needed for moderate pain (pain score 4-6). 120 tablet 0   HYDROcodone -acetaminophen  (NORCO) 10-325 MG tablet Take 1 tablet by mouth every 6 (six) hours as needed for moderate pain (pain score 4-6). 120 tablet 0   HYDROcodone -acetaminophen  (NORCO) 10-325 MG tablet Take 1 tablet by mouth every 6 (six) hours as needed for moderate pain (pain score 4-6). 120 tablet 0   hydroxychloroquine  (PLAQUENIL ) 200 MG tablet Take 1 tablet (200 mg total) by mouth daily. 90 tablet 1   insulin  degludec (TRESIBA FLEXTOUCH) 100 UNIT/ML FlexTouch Pen Inject 15 Units into the skin daily. 15 mL 4   Insulin  Pen Needle 32G X 4 MM MISC Use 4x a day 300 each 3   Lancets (ACCU-CHEK SOFT TOUCH) lancets Test once per day and diagnosis code is E 11.9 and dispense for Aviva plus 100 each 3   levocetirizine (XYZAL ) 5 MG tablet TAKE 1 TABLET BY MOUTH EVERY DAY IN THE EVENING 90 tablet 3   levocetirizine (XYZAL ) 5 MG tablet Take 1 tablet (5 mg total) by mouth every evening. 90 tablet 3   metFORMIN  (GLUCOPHAGE ) 500 MG tablet Take 1 tablet (500 mg total) by mouth 2 (two) times daily with a meal. 180 tablet 3   metoCLOPramide  (REGLAN ) 10 MG tablet Take 1 tablet (10 mg total) by mouth 3 (three) times daily with meals for 5 days. 15 tablet 0   mupirocin  ointment (BACTROBAN ) 2 % Apply 1 Application topically 2 (two) times daily. 22 g 5    omeprazole  (PRILOSEC) 40 MG capsule TAKE 1 CAPSULE BY MOUTH EVERY DAY 90 capsule 3   ondansetron  (ZOFRAN ) 4 MG tablet Take 1 tablet (4 mg total) by mouth every 8 (eight) hours as needed for nausea or vomiting. 90 tablet 3   polyethylene glycol (MIRALAX  / GLYCOLAX ) 17 g packet Take 17 g by mouth daily as needed for mild constipation.     potassium chloride  (KLOR-CON ) 10 MEQ tablet TAKE 1 TABLET BY MOUTH EVERY DAY 90 tablet 3   pregabalin  (LYRICA ) 100 MG capsule Take 1 capsule (100 mg total) by mouth at bedtime. 90 capsule 1   RESTASIS  0.05 % ophthalmic emulsion Place 1 drop into both eyes 2 (two) times daily.  sitaGLIPtin  (JANUVIA ) 100 MG tablet Take 1 tablet (100 mg total) by mouth at bedtime. 90 tablet 3   sucralfate  (CARAFATE ) 1 g tablet Take 1 tablet (1 g total) by mouth 4 (four) times daily. Dissolve each tablet in 15 cc water before use. 120 tablet 2   No current facility-administered medications for this visit.    SURGICAL HISTORY:  Past Surgical History:  Procedure Laterality Date   BRONCHIAL BRUSHINGS  04/07/2023   Procedure: BRONCHIAL BRUSHINGS;  Surgeon: Shelah Lamar RAMAN, MD;  Location: [facility] ENDOSCOPY;  Service: Pulmonary;;   BRONCHIAL NEEDLE ASPIRATION BIOPSY  04/07/2023   Procedure: BRONCHIAL NEEDLE ASPIRATION BIOPSIES;  Surgeon: Shelah Lamar RAMAN, MD;  Location: [facility] ENDOSCOPY;  Service: Pulmonary;;   CATARACT EXTRACTION     COLONOSCOPY  11/23/2019   per Dr. Teressa, adenmatous polyp, repeat in 7 yrs   ESOPHAGOGASTRODUODENOSCOPY  06/20/2008   at [facility], clear    FINE NEEDLE ASPIRATION  04/07/2023   Procedure: FINE NEEDLE ASPIRATION (FNA) LINEAR;  Surgeon: Shelah Lamar RAMAN, MD;  Location: [facility] ENDOSCOPY;  Service: Pulmonary;;   IR IMAGING GUIDED PORT INSERTION  04/30/2023   LUMBAR LAMINECTOMY     1995   ROTATOR CUFF REPAIR Right    SPINAL FUSION N/A 07/29/2022   Procedure: T10-PELVIS INSTRUMENTATION/ T10-L4 POSTERIOR SPINAL FUSION;  Surgeon: Georgina Ozell LABOR, MD;  Location: [facility]  OR;  Service: Orthopedics;  Laterality: N/A;   UPPER GASTROINTESTINAL ENDOSCOPY  2010   [facility]    VIDEO BRONCHOSCOPY WITH ENDOBRONCHIAL ULTRASOUND N/A 04/07/2023   Procedure: VIDEO BRONCHOSCOPY WITH ENDOBRONCHIAL ULTRASOUND;  Surgeon: Shelah Lamar RAMAN, MD;  Location: [facility] ENDOSCOPY;  Service: Pulmonary;  Laterality: N/A;    REVIEW OF SYSTEMS:  Constitutional: positive for fatigue Eyes: negative Ears, nose, mouth, throat, and face: negative Respiratory: positive for dyspnea on exertion Cardiovascular: negative Gastrointestinal: negative Genitourinary:negative Integument/breast: negative Hematologic/lymphatic: negative Musculoskeletal:negative Neurological: negative Behavioral/Psych: negative Endocrine: negative Allergic/Immunologic: negative   PHYSICAL EXAMINATION: General appearance: alert, cooperative, fatigued, and no distress Head: Normocephalic, without obvious abnormality, atraumatic Neck: no adenopathy, no JVD, supple, symmetrical, trachea midline, and thyroid  not enlarged, symmetric, no tenderness/mass/nodules Lymph nodes: Cervical, supraclavicular, and axillary nodes normal. Resp: clear to auscultation bilaterally Back: symmetric, no curvature. ROM normal. No CVA tenderness. Cardio: regular rate and rhythm, S1, S2 normal, no murmur, click, rub or gallop GI: soft, non-tender; bowel sounds normal; no masses,  no organomegaly Extremities: extremities normal, atraumatic, no cyanosis or edema Neurologic: Alert and oriented X 3, normal strength and tone. Normal symmetric reflexes. Normal coordination and gait  ECOG PERFORMANCE STATUS: 1 - Symptomatic but completely ambulatory  Blood pressure (!) 140/77, pulse 93, temperature 97.6 F (36.4 C), temperature source Temporal, resp. rate 16, height 5' 3 (1.6 m), weight 186 lb (84.4 kg), SpO2 95%.  LABORATORY DATA: Lab Results  Component Value Date   WBC 5.3 10/13/2023   HGB 10.5 (L) 10/13/2023   HCT 34.2 (L) 10/13/2023   MCV 79.0  (L) 10/13/2023   PLT 183 10/13/2023      Chemistry      Component Value Date/Time   NA 139 09/15/2023 1026   K 4.2 09/15/2023 1026   CL 107 09/15/2023 1026   CO2 27 09/15/2023 1026   BUN 16 09/15/2023 1026   CREATININE 0.77 09/15/2023 1026   CREATININE 1.07 (H) 09/16/2019 1511      Component Value Date/Time   CALCIUM  8.9 09/15/2023 1026   ALKPHOS 77 09/15/2023 1026   AST 12 (L) 09/15/2023 1026  ALT 8 09/15/2023 1026   BILITOT 0.5 09/15/2023 1026       RADIOGRAPHIC STUDIES: CT Chest W Contrast Result Date: 10/13/2023 CLINICAL DATA:  Non-small cell lung cancer (NSCLC), non-metastatic, assess treatment response * Tracking Code: BO * EXAM: CT CHEST WITH CONTRAST TECHNIQUE: Multidetector CT imaging of the chest was performed during intravenous contrast administration. RADIATION DOSE REDUCTION: This exam was performed according to the departmental dose-optimization program which includes automated exposure control, adjustment of the mA and/or kV according to patient size and/or use of iterative reconstruction technique. CONTRAST:  75mL OMNIPAQUE  IOHEXOL  300 MG/ML  SOLN COMPARISON:  Chest CT 07/03/2023 and 04/01/2023.  PET-CT 02/05/2023. FINDINGS: Cardiovascular: No acute vascular findings. Mild atherosclerosis of the aorta, great vessels and coronary arteries. Right IJ Port-A-Cath extends to the superior cavoatrial junction. The heart size is normal. There is no pericardial effusion. Mediastinum/Nodes: No residual enlarged mediastinal, hilar or axillary lymph nodes. Previously demonstrated right paratracheal node has decreased in size, measuring 7 mm short axis on image 48/2 (previously 14 mm, remeasured in the same plane). The thyroid  gland, trachea and esophagus demonstrate no significant findings. Lungs/Pleura: Treated lesion in the superior segment of the right lower lobe is partially obscured by new bandlike opacities consistent with radiation therapy, although appears decreased in size,  measuring approximately 1.5 x 1.2 cm on image 62/7 (previously 1.6 x 1.4 cm). Surrounding bandlike opacities, volume loss and air bronchograms extend into the central perihilar portions of the right upper, middle and lower lobes, consistent with interval radiation therapy. Stable mild left upper lobe nodularity along the aortic arch, measuring 5 mm on image 129/6 unchanged from priors dating back to 01/10/2023. No new or enlarging nodules are identified. Upper abdomen: Small hiatal hernia. The visualized upper abdomen otherwise appears unremarkable. Musculoskeletal/Chest wall: There is a new 1.3 cm lytic lesion anteriorly in the T8 vertebral body, best seen on sagittal image 111/6. No other lytic lesions are identified. No evidence of discitis or osteomyelitis. Patient is status post thoracolumbar fusion extending inferiorly from T10. IMPRESSION: 1. Interval decrease in size of treated lesion in the superior segment of the right lower lobe with new surrounding radiation changes. 2. No residual enlarged mediastinal, hilar or axillary lymph nodes. 3. New lytic lesion in the T8 vertebral body, suspicious for metastatic disease. Consider further evaluation with MRI of the thoracic spine. 4. Stable small left upper lobe pulmonary nodule, likely benign based on stability. Continued attention on follow-up recommended. 5.  Aortic Atherosclerosis (ICD10-I70.0). Electronically Signed   By: Elsie Perone M.D.   On: 10/13/2023 10:05      ASSESSMENT AND PLAN: This is a very pleasant 78 years old African-American female with Stage IIIa (T1c, N2, M0) non-small cell lung cancer, adenocarcinoma presented with right lower lobe lung nodule in addition to right paratracheal lymphadenopathy diagnosed in February 2025. Molecular studies by [email or web address] showed no actionable mutations and negative PD-L1 expression. She also has a history of rheumatoid arthritis and currently on treatment with Plaquenil . She underwent a course of  concurrent chemoradiation with weekly carboplatin  for AUC of 2 and paclitaxel  45 Mg/M2.  First dose May 04, 2023.  States post 7 cycles.  Last cycle was given on 06/15/2023. She is currently undergoing consolidation treatment with immunotherapy with Imfinzi  1500 Mg IV every 4 weeks status post 3 cycles. She had repeat CT scan of the chest performed recently.  I personally and independently reviewed the scan images and discussed the result with the patient and her daughter.  Her scan showed improvement in the right lower lobe lung mass but there was a lytic lesion at the T8 vertebrae. Assessment and Plan Assessment & Plan Stage 3A non-small cell lung cancer, adenocarcinoma, status post chemoradiation and on consolidation immunotherapy Stage 3A non-small cell lung cancer, adenocarcinoma, diagnosed in February 2025. Status post concurrent chemoradiation with weekly carboplatin  and paclitaxel . Currently on consolidation treatment with immunotherapy, 1500 mg IV every four weeks. Post three cycles, preparing for cycle four. Recent CT scan shows decrease in size of treated lesion in the right lower lobe with no residual lymph nodes or concerning changes, indicating improvement. Continued treatment is deemed effective. - Continue current immunotherapy regimen, 1500 mg IV every four weeks - Proceed with cycle four of immunotherapy  Lytic lesion at T8 vertebra, possible metastasis versus arthritis-related etiology Lytic lesion identified at T8 vertebra on recent imaging. Differential diagnosis includes metastasis from lung cancer versus arthritis-related changes. Rheumatoid arthritis and prior back surgery complicate the assessment. Further evaluation with MRI is necessary to determine the etiology. The lesion measures 1.5 by 1.2 cm. Consultation with Dr. Georgina, orthopedic surgeon, is advised to discuss the need for MRI and further management. - Order MRI of the thoracic spine to evaluate the lytic lesion at T8 -  Consult with Dr. Georgina, orthopedic surgeon, regarding the need for MRI and further management The patient was advised to call immediately if she has any other concerning symptoms in the interval. The patient voices understanding of current disease status and treatment options and is in agreement with the current care plan.  All questions were answered. The patient knows to call the clinic with any problems, questions or concerns. We can certainly see the patient much sooner if necessary.  The total time spent in the appointment was 30 minutes.  Disclaimer: This note was dictated with voice recognition software. Similar sounding words can inadvertently be transcribed and may not be corrected upon review.

## 2023-10-13 NOTE — Patient Instructions (Addendum)
 [facility] - A DEPT OF [facility]  Discharge Instructions: Thank you for choosing [name] to provide your oncology and hematology care.   If you have a lab appointment with the [name], please go directly to the [name] and check in at the registration area.   Wear comfortable clothing and clothing appropriate for easy access to any Portacath or PICC line.   We strive to give you quality time with your provider. You may need to reschedule your appointment if you arrive late (15 or more minutes).  Arriving late affects you and other patients whose appointments are after yours.  Also, if you miss three or more appointments without notifying the office, you may be dismissed from the clinic at the provider's discretion.      For prescription refill requests, have your pharmacy contact our office and allow 72 hours for refills to be completed.    Today you received the following chemotherapy and/or immunotherapy agents: Imfinzi     Durvalumab  Injection What is this medication? DURVALUMAB  (dur VAL ue mab) treats some types of cancer. It works by helping your immune system slow or stop the spread of cancer cells. It is a monoclonal antibody. This medicine may be used for other purposes; ask your health care provider or pharmacist if you have questions. COMMON BRAND NAME(S): IMFINZI  What should I tell my care team before I take this medication? They need to know if you have any of these conditions: Allogeneic stem cell transplant (uses someone else's stem cells) Autoimmune diseases, such as Crohn disease, ulcerative colitis, lupus History of chest radiation Nervous system problems, such as Guillain-Barre syndrome, myasthenia gravis Organ transplant An unusual or allergic reaction to durvalumab , other medications, foods, dyes, or preservatives Pregnant or trying to get pregnant Breast-feeding How should I use this medication? This  medication is infused into a vein. It is given by your care team in a hospital or clinic setting. A special MedGuide will be given to you before each treatment. Be sure to read this information carefully each time. Talk to your care team about the use of this medication in children. Special care may be needed. Overdosage: If you think you have taken too much of this medicine contact a poison control [name] or emergency room at once. NOTE: This medicine is only for you. Do not share this medicine with others. What if I miss a dose? Keep appointments for follow-up doses. It is important not to miss your dose. Call your care team if you are unable to keep an appointment. What may interact with this medication? Interactions have not been studied. This list may not describe all possible interactions. Give your health care provider a list of all the medicines, herbs, non-prescription drugs, or dietary supplements you use. Also tell them if you smoke, drink alcohol, or use illegal drugs. Some items may interact with your medicine. What should I watch for while using this medication? Your condition will be monitored carefully while you are receiving this medication. You may need blood work while taking this medication. This medication may cause serious skin reactions. They can happen weeks to months after starting the medication. Contact your care team right away if you notice fevers or flu-like symptoms with a rash. The rash may be red or purple and then turn into blisters or peeling of the skin. You may also notice a red rash with swelling of the face, lips, or lymph nodes  in your neck or under your arms. Tell your care team right away if you have any change in your eyesight. Talk to your care team if you may be pregnant. Serious birth defects can occur if you take this medication during pregnancy and for 3 months after the last dose. You will need a negative pregnancy test before starting this medication.  Contraception is recommended while taking this medication and for 3 months after the last dose. Your care team can help you find the option that works for you. Do not breastfeed while taking this medication and for 3 months after the last dose. What side effects may I notice from receiving this medication? Side effects that you should report to your care team as soon as possible: Allergic reactions--skin rash, itching, hives, swelling of the face, lips, tongue, or throat Dry cough, shortness of breath or trouble breathing Eye pain, redness, irritation, or discharge with blurry or decreased vision Heart muscle inflammation--unusual weakness or fatigue, shortness of breath, chest pain, fast or irregular heartbeat, dizziness, swelling of the ankles, feet, or hands Hormone gland problems--headache, sensitivity to light, unusual weakness or fatigue, dizziness, fast or irregular heartbeat, increased sensitivity to cold or heat, excessive sweating, constipation, hair loss, increased thirst or amount of urine, tremors or shaking, irritability Infusion reactions--chest pain, shortness of breath or trouble breathing, feeling faint or lightheaded Kidney injury (glomerulonephritis)--decrease in the amount of urine, red or dark brown urine, foamy or bubbly urine, swelling of the ankles, hands, or feet Liver injury--right upper belly pain, loss of appetite, nausea, light-colored stool, dark yellow or brown urine, yellowing skin or eyes, unusual weakness or fatigue Pain, tingling, or numbness in the hands or feet, muscle weakness, change in vision, confusion or trouble speaking, loss of balance or coordination, trouble walking, seizures Rash, fever, and swollen lymph nodes Redness, blistering, peeling, or loosening of the skin, including inside the mouth Sudden or severe stomach pain, bloody diarrhea, fever, nausea, vomiting Side effects that usually do not require medical attention (report these to your care team  if they continue or are bothersome): Bone, joint, or muscle pain Diarrhea Fatigue Loss of appetite Nausea Skin rash This list may not describe all possible side effects. Call your doctor for medical advice about side effects. You may report side effects to FDA at 1-800-FDA-1088. Where should I keep my medication? This medication is given in a hospital or clinic. It will not be stored at home. NOTE: This sheet is a summary. It may not cover all possible information. If you have questions about this medicine, talk to your doctor, pharmacist, or health care provider.  2024 Elsevier/Gold Standard (2021-07-02 00:00:00)   To help prevent nausea and vomiting after your treatment, we encourage you to take your nausea medication as directed.  BELOW ARE SYMPTOMS THAT SHOULD BE REPORTED IMMEDIATELY: *FEVER GREATER THAN 100.4 F (38 C) OR HIGHER *CHILLS OR SWEATING *NAUSEA AND VOMITING THAT IS NOT CONTROLLED WITH YOUR NAUSEA MEDICATION *UNUSUAL SHORTNESS OF BREATH *UNUSUAL BRUISING OR BLEEDING *URINARY PROBLEMS (pain or burning when urinating, or frequent urination) *BOWEL PROBLEMS (unusual diarrhea, constipation, pain near the anus) TENDERNESS IN MOUTH AND THROAT WITH OR WITHOUT PRESENCE OF ULCERS (sore throat, sores in mouth, or a toothache) UNUSUAL RASH, SWELLING OR PAIN  UNUSUAL VAGINAL DISCHARGE OR ITCHING   Items with * indicate a potential emergency and should be followed up as soon as possible or go to the Emergency Department if any problems should occur.  Please show the CHEMOTHERAPY ALERT  CARD or IMMUNOTHERAPY ALERT CARD at check-in to the Emergency Department and triage nurse.  Should you have questions after your visit or need to cancel or reschedule your appointment, please contact [facility] A DE[facility]  Dept: [phone]  and follow the prompts.  Office hours are 8:00 a.m. to 4:30 p.m. Monday - Friday. Please note that voicemails left  after 4:00 p.m. may not be returned until the following business day.  We are closed weekends and major holidays. You have access to a nurse at all times for urgent questions. Please call the main number to the clinic Dept: [phone] and follow the prompts.   For any non-urgent questions, you may also contact your provider using MyChart. We now offer e-Visits for anyone 48 and older to request care online for non-urgent symptoms. For details visit [email or web address].   Also download the MyChart app! Go to the app store, search MyChart, open the app, select [facility], and log in with your MyChart username and password.

## 2023-10-16 ENCOUNTER — Ambulatory Visit (INDEPENDENT_AMBULATORY_CARE_PROVIDER_SITE_OTHER)

## 2023-10-16 ENCOUNTER — Ambulatory Visit

## 2023-10-16 ENCOUNTER — Ambulatory Visit: Attending: Rheumatology | Admitting: Rheumatology

## 2023-10-16 ENCOUNTER — Encounter: Payer: Self-pay | Admitting: Rheumatology

## 2023-10-16 VITALS — BP 117/76 | HR 88 | Resp 17 | Ht 63.0 in | Wt 182.0 lb

## 2023-10-16 DIAGNOSIS — K219 Gastro-esophageal reflux disease without esophagitis: Secondary | ICD-10-CM

## 2023-10-16 DIAGNOSIS — Z79899 Other long term (current) drug therapy: Secondary | ICD-10-CM

## 2023-10-16 DIAGNOSIS — M79672 Pain in left foot: Secondary | ICD-10-CM

## 2023-10-16 DIAGNOSIS — M069 Rheumatoid arthritis, unspecified: Secondary | ICD-10-CM | POA: Diagnosis not present

## 2023-10-16 DIAGNOSIS — M79671 Pain in right foot: Secondary | ICD-10-CM

## 2023-10-16 DIAGNOSIS — H903 Sensorineural hearing loss, bilateral: Secondary | ICD-10-CM

## 2023-10-16 DIAGNOSIS — M79642 Pain in left hand: Secondary | ICD-10-CM | POA: Diagnosis not present

## 2023-10-16 DIAGNOSIS — Z87891 Personal history of nicotine dependence: Secondary | ICD-10-CM

## 2023-10-16 DIAGNOSIS — E1142 Type 2 diabetes mellitus with diabetic polyneuropathy: Secondary | ICD-10-CM

## 2023-10-16 DIAGNOSIS — C3431 Malignant neoplasm of lower lobe, right bronchus or lung: Secondary | ICD-10-CM | POA: Diagnosis not present

## 2023-10-16 DIAGNOSIS — M79641 Pain in right hand: Secondary | ICD-10-CM | POA: Diagnosis not present

## 2023-10-16 DIAGNOSIS — E119 Type 2 diabetes mellitus without complications: Secondary | ICD-10-CM | POA: Diagnosis not present

## 2023-10-16 DIAGNOSIS — Z981 Arthrodesis status: Secondary | ICD-10-CM | POA: Diagnosis not present

## 2023-10-16 DIAGNOSIS — I10 Essential (primary) hypertension: Secondary | ICD-10-CM

## 2023-10-16 DIAGNOSIS — B1710 Acute hepatitis C without hepatic coma: Secondary | ICD-10-CM

## 2023-10-16 NOTE — Patient Instructions (Addendum)
 Rheumatoid Arthritis Rheumatoid arthritis (RA) is a long-term (chronic) disease. RA causes inflammation in your joints. Your joints may feel painful, stiff, swollen, and warm. RA may start slowly. It most often affects the small joints of the hands and feet. It can also affect other parts of the body. Symptoms of RA often come and go. There is no cure for RA, but medicines can help your symptoms. What are the causes? RA is an autoimmune disease. This means that your body's defense system (immune system) attacks healthy parts of your body by mistake. The exact cause of RA is not known. What increases the risk? Being female. Having a family history of RA or other diseases like RA. Smoking. Being very overweight (obese). Being exposed to pollutants or chemicals. What are the signs or symptoms? Symptoms start slowly. They are often worse in the morning. The first symptom is often morning stiffness that lasts longer than 30 minutes. As RA gets worse, symptoms may include: Pain, stiffness, swelling, warmth, and tenderness in joints on both sides of your body. Loss of energy. Not wanting to eat as much as normal. Weight loss. A low fever. Dry eyes and a dry mouth. Firm lumps that grow under your skin. Changes in the way your joints look or the way they work. Symptoms vary and they often come and go. Symptoms sometimes get worse for a period of time. These are called flares. How is this treated? Treatment may include: Taking good care of yourself. Be sure to rest as needed, eat a healthy diet, and exercise. Medicines. These may include: Pain relievers. Medicines to help with inflammation. Disease-modifying antirheumatic drugs (DMARDs). Medicines called biologic response modifiers. Physical therapy and occupational therapy. Surgery, if joint damage is very bad. Your doctor will work with you to find the best treatments. Follow these instructions at home: Managing pain, stiffness, and  swelling If told, put heat on the affected area. Do this as often as told by your doctor. Use the heat source that your doctor recommends, such as a moist heat pack or a heating pad. Place a towel between your skin and the heat source. Leave the heat on for 20-30 minutes. Take off the heat if your skin turns bright red. This is very important. If you cannot feel pain, heat, or cold, you have a greater risk of getting burned.  Activity Return to your normal activities when your doctor says that it is safe. Rest when you have a flare. Exercise as told by your doctor. This can help your joints move better and get stronger. General instructions Take over-the-counter and prescription medicines only as told by your doctor. Keep all follow-up visits. Where to find more information [employer] of Rheumatology: rheumatology.org Arthritis Foundation: arthritis.org Contact a doctor if: You have a flare. You have a fever. You have problems because of your medicines. Get help right away if: You have chest pain. You have trouble breathing. You get a hot, painful joint all of a sudden, and it is worse than your normal joint aches. These symptoms may be an emergency. Get help right away. Call 911. Do not wait to see if the symptoms will go away. Do not drive yourself to the hospital. Summary RA is a long-term disease. RA causes inflammation in your joints. Symptoms of RA start slowly. They are often worse in the morning. This information is not intended to replace advice given to you by your health care provider. Make sure you discuss any questions you have with  your health care provider. Document Revised: 12/20/2020 Document Reviewed: 12/20/2020 Elsevier Patient Education  2024 Elsevier Inc. Hydroxychloroquine  Tablets What is this medication? HYDROXYCHLOROQUINE  (hye drox ee KLOR oh kwin) treats autoimmune conditions, such as rheumatoid arthritis and lupus. It works by slowing down an  overactive immune system. It may also be used to prevent and treat malaria. It works by killing the parasite that causes malaria. It belongs to a group of medications called DMARDs. This medicine may be used for other purposes; ask your health care provider or pharmacist if you have questions. COMMON BRAND NAME(S): Plaquenil , Quineprox, SOVUNA  What should I tell my care team before I take this medication? They need to know if you have any of these conditions: Diabetes Eye disease, vision problems Frequently drink alcohol G6PD deficiency Heart disease Irregular heartbeat or rhythm Kidney disease Liver disease Porphyria Psoriasis An unusual or allergic reaction to hydroxychloroquine , other medications, foods, dyes, or preservatives Pregnant or trying to get pregnant Breastfeeding How should I use this medication? Take this medication by mouth with water. Take it as directed on the prescription label. Do not cut, crush, or chew this medication. Swallow the tablets whole. Take it with food. Do not take it more than directed. Take all of this medication unless your care team tells you to stop it early. Keep taking it even if you think you are better. Take products with antacids in them at a different time of day than this medication. Take this medication 4 hours before or 4 hours after antacids. Talk to your care team if you have questions. Talk to your care team about the use of this medication in children. While this medication may be prescribed for selected conditions, precautions do apply. Overdosage: If you think you have taken too much of this medicine contact a poison control center or emergency room at once. NOTE: This medicine is only for you. Do not share this medicine with others. What if I miss a dose? If you miss a dose, take it as soon as you can. If it is almost time for your next dose, take only that dose. Do not take double or extra doses. What may interact with this  medication? Do not take this medication with any of the following: Cisapride Dronedarone Pimozide Thioridazine This medication may also interact with the following: Ampicillin Antacids Cimetidine Cyclosporine  Digoxin Kaolin Medications for diabetes, such as insulin , glipizide , glyburide Medications for seizures, such as carbamazepine, phenobarbital, phenytoin Mefloquine Methotrexate Other medications that cause heart rhythm changes Praziquantel This list may not describe all possible interactions. Give your health care provider a list of all the medicines, herbs, non-prescription drugs, or dietary supplements you use. Also tell them if you smoke, drink alcohol, or use illegal drugs. Some items may interact with your medicine. What should I watch for while using this medication? Visit your care team for regular checks on your progress. Tell your care team if your symptoms do not start to get better or if they get worse. You may need blood work done while you are taking this medication. If you take other medications that can affect heart rhythm, you may need more testing. Talk to your care team if you have questions. Your vision may be tested before and during use of this medication. Tell your care team right away if you have any change in your eyesight. This medication may cause serious skin reactions. They can happen weeks to months after starting the medication. Contact your care team right away if  you notice fevers or flu-like symptoms with a rash. The rash may be red or purple and then turn into blisters or peeling of the skin. Or, you might notice a red rash with swelling of the face, lips or lymph nodes in your neck or under your arms. If you or your family notice any changes in your behavior, such as new or worsening depression, thoughts of harming yourself, anxiety, or other unusual or disturbing thoughts, or memory loss, call your care team right away. What side effects may I notice  from receiving this medication? Side effects that you should report to your care team as soon as possible: Allergic reactions--skin rash, itching, hives, swelling of the face, lips, tongue, or throat Aplastic anemia--unusual weakness or fatigue, dizziness, headache, trouble breathing, increased bleeding or bruising Change in vision Heart rhythm changes--fast or irregular heartbeat, dizziness, feeling faint or lightheaded, chest pain, trouble breathing Infection--fever, chills, cough, or sore throat Low blood sugar (hypoglycemia)--tremors or shaking, anxiety, sweating, cold or clammy skin, confusion, dizziness, rapid heartbeat Muscle injury--unusual weakness or fatigue, muscle pain, dark yellow or brown urine, decrease in amount of urine Pain, tingling, or numbness in the hands or feet Rash, fever, and swollen lymph nodes Redness, blistering, peeling, or loosening of the skin, including inside the mouth Thoughts of suicide or self-harm, worsening mood, or feelings of depression Unusual bruising or bleeding Side effects that usually do not require medical attention (report to your care team if they continue or are bothersome): Diarrhea Headache Nausea Stomach pain Vomiting This list may not describe all possible side effects. Call your doctor for medical advice about side effects. You may report side effects to FDA at 1-800-FDA-1088. Where should I keep my medication? Keep out of the reach of children and pets. Store at room temperature up to 30 degrees C (86 degrees F). Protect from light. Get rid of any unused medication after the expiration date. To get rid of medications that are no longer needed or have expired: Take the medication to a medication take-back program. Check with your pharmacy or law enforcement to find a location. If you cannot return the medication, check the label or package insert to see if the medication should be thrown out in the garbage or flushed down the toilet.  If you are not sure, ask your care team. If it is safe to put it in the trash, empty the medication out of the container. Mix the medication with cat litter, dirt, coffee grounds, or other unwanted substance. Seal the mixture in a bag or container. Put it in the trash. NOTE: This sheet is a summary. It may not cover all possible information. If you have questions about this medicine, talk to your doctor, pharmacist, or health care provider.  2024 Elsevier/Gold Standard (2021-08-26 00:00:00) Vaccines You are taking a medication(s) that can suppress your immune system.  The following immunizations are recommended: Flu annually Covid-19  Td/Tdap (tetanus, diphtheria, pertussis) every 10 years Pneumonia (Prevnar 15 then Pneumovax 23 at least 1 year apart.  Alternatively, can take Prevnar 20 without needing additional dose) Shingrix: 2 doses from 4 weeks to 6 months apart  Please check with your PCP to make sure you are up to date.     Please see an ophthalmologist on an annual basis to screen for ocular toxicity while you are on hydroxychloroquine .

## 2023-10-19 ENCOUNTER — Ambulatory Visit: Payer: Self-pay | Admitting: Rheumatology

## 2023-10-19 NOTE — Progress Notes (Signed)
 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  Please add ENA panel if possible.  I will discuss results at the follow-up visit.

## 2023-10-20 ENCOUNTER — Ambulatory Visit (HOSPITAL_COMMUNITY)
Admission: RE | Admit: 2023-10-20 | Discharge: 2023-10-20 | Disposition: A | Discharge status: Home / Self Care | Source: Ambulatory Visit | Attending: Internal Medicine | Admitting: Internal Medicine

## 2023-10-20 DIAGNOSIS — C3431 Malignant neoplasm of lower lobe, right bronchus or lung: Secondary | ICD-10-CM

## 2023-10-20 DIAGNOSIS — M898X8 Other specified disorders of bone, other site: Secondary | ICD-10-CM | POA: Diagnosis not present

## 2023-10-20 MED ORDER — GADOBUTROL 1 MMOL/ML IV SOLN
8.0000 mL | Freq: Once | INTRAVENOUS | Status: AC | PRN
  Administered 2023-10-20: 8 mL via INTRAVENOUS

## 2023-10-21 LAB — SEDIMENTATION RATE: Sed Rate: 41 mm/h — ABNORMAL HIGH (ref 0–30)

## 2023-10-21 LAB — ANA: Anti Nuclear Antibody (ANA): POSITIVE — AB

## 2023-10-21 LAB — TEST AUTHORIZATION

## 2023-10-21 LAB — ANTI-NUCLEAR AB-TITER (ANA TITER): ANA Titer 1: 1:80 {titer} — ABNORMAL HIGH

## 2023-10-21 LAB — SJOGREN'S SYNDROME ANTIBODS(SSA + SSB)
SSA (Ro) (ENA) Antibody, IgG: <1 AI
SSB (La) (ENA) Antibody, IgG: <1 AI

## 2023-10-21 LAB — SM AND SM/RNP ANTIBODIES
ENA SM Ab Ser-aCnc: <1 AI
SM/RNP: <1 AI

## 2023-10-21 LAB — CYCLIC CITRUL PEPTIDE ANTIBODY, IGG: Cyclic Citrullin Peptide Ab: <16 U

## 2023-10-21 LAB — ANTI-DNA ANTIBODY, DOUBLE-STRANDED: ds DNA Ab: 14 [IU]/mL — ABNORMAL HIGH

## 2023-10-21 LAB — RHEUMATOID FACTOR: Rheumatoid fact SerPl-aCnc: 22 [IU]/mL — ABNORMAL HIGH (ref <14)

## 2023-10-21 LAB — GLUCOSE 6 PHOSPHATE DEHYDROGENASE: G-6PDH: 20.7 U/g{Hb} — ABNORMAL HIGH (ref 7.0–20.5)

## 2023-10-21 LAB — ANTI-SCLERODERMA ANTIBODY: Scleroderma (Scl-70) (ENA) Antibody, IgG: 1.2 AI — AB

## 2023-10-21 NOTE — Progress Notes (Signed)
 I will discuss results at the follow-up visit.

## 2023-10-29 NOTE — Progress Notes (Signed)
 Office Visit Note  Patient: Wendy [name]             Date of Birth: Aug 08, 1945           MRN: [id number]             PCP: [name] LABOR, MD Referring: [name] LABOR, MD Visit Date: 11/12/2023 Occupation: @GUAROCC @  Subjective:  Medication management  History of Present Illness: LOURA PITT is a 78 y.o. female with rheumatoid arthritis and positive double-stranded DNA, positive SCL 70 antibody.  She returns today after her initial evaluation on October 16, 2023.  She was under care of Dr. Ishmael since 2008 and later her prescription was taken over by her PCP Dr. Johnny.  At the last visit she complained of pain mostly in her shoulders and lower back.  She had been on chemotherapy followed by radiation therapy for lung cancer.  Patient states that she had finished chemotherapy yesterday.    Activities of Daily Living:  Patient reports morning stiffness for a few minutes.   Patient Reports nocturnal pain.  Difficulty dressing/grooming: Reports Difficulty climbing stairs: Reports Difficulty getting out of chair: Reports Difficulty using hands for taps, buttons, cutlery, and/or writing: Reports  Review of Systems  Constitutional:  Positive for fatigue.  HENT:  Positive for mouth dryness. Negative for mouth sores.   Eyes:  Positive for dryness.  Respiratory:  Positive for shortness of breath.   Cardiovascular:  Positive for chest pain, palpitations and swelling in legs/feet.  Gastrointestinal:  Negative for blood in stool, constipation and diarrhea.  Endocrine: Negative for increased urination.  Genitourinary:  Negative for involuntary urination.  Musculoskeletal:  Positive for joint pain, gait problem, joint pain, joint swelling, myalgias, muscle weakness, morning stiffness, muscle tenderness and myalgias.  Skin:  Positive for sensitivity to sunlight. Negative for color change and rash.  Allergic/Immunologic: Negative for susceptible to infections.  Neurological:  Positive  for headaches. Negative for dizziness.  Hematological:  Negative for swollen glands.  Psychiatric/Behavioral:  Positive for depressed mood and sleep disturbance. The patient is nervous/anxious.     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   FOOT PAIN 05/23/2009   DEPENDENT EDEMA 07/21/2007   HEPATITIS C 02/08/2007   GERD 11/20/2006   Diabetic neuropathy (HCC) 11/05/2006   Essential hypertension 11/05/2006   Rheumatoid arthritis (HCC) 11/05/2006   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    Past Medical History:  Diagnosis Date   Allergy    year around   Anemia    long time ago    Anxiety    Arthritis    RA    Cataract    removed  both dr effie-    Complication of anesthesia    DEPENDENT EDEMA 07/21/2007   DIABETES MELLITUS, TYPE II 11/05/2006   FOOT PAIN 05/23/2009   GERD 11/20/2006   Hepatitis    HEPATITIS C 02/08/2007   sees GI @ [facility]   Hyperlipidemia    HYPERTENSION 11/05/2006   LOW BACK PAIN SYNDROME 07/21/2007   Neuromuscular disorder (HCC)  neuropathy legs, feet    PONV (postoperative nausea and vomiting)    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 Jon Jacob     Family History  Problem Relation Age of Onset   Lung cancer Mother    Heart Problems Father    Pancreatic cancer Sister    Lung cancer Sister    Congestive Heart Failure Sister    Cancer Brother    Diabetes Brother    Other Brother        dehydration   Arthritis Other    Diabetes  Other    Hypertension Other    Cancer Other        lung   Healthy Son    Breast cancer Daughter    Colon cancer Neg Hx    Colon polyps Neg Hx    Esophageal cancer Neg Hx    Rectal cancer Neg Hx    Stomach cancer Neg Hx    Past Surgical History:  Procedure Laterality Date   BRONCHIAL BRUSHINGS  04/07/2023   Procedure: BRONCHIAL BRUSHINGS;  Surgeon: Shelah Lamar RAMAN, MD;  Location: [facility] ENDOSCOPY;  Service: Pulmonary;;   BRONCHIAL NEEDLE ASPIRATION BIOPSY  04/07/2023   Procedure: BRONCHIAL NEEDLE ASPIRATION BIOPSIES;  Surgeon: Shelah Lamar RAMAN, MD;  Location: [facility] ENDOSCOPY;  Service: Pulmonary;;   CATARACT EXTRACTION     COLONOSCOPY  11/23/2019   per Dr. Teressa, adenmatous polyp, repeat in 7 yrs   ESOPHAGOGASTRODUODENOSCOPY  06/20/2008   at [facility], clear    FINE NEEDLE ASPIRATION  04/07/2023   Procedure: FINE NEEDLE ASPIRATION (FNA) LINEAR;  Surgeon: Shelah Lamar RAMAN, MD;  Location: [facility] ENDOSCOPY;  Service: Pulmonary;;   IR IMAGING GUIDED PORT INSERTION  04/30/2023   LUMBAR LAMINECTOMY     1995   ROTATOR CUFF REPAIR Right    SPINAL FUSION N/A 07/29/2022   Procedure: T10-PELVIS INSTRUMENTATION/ T10-L4 POSTERIOR SPINAL FUSION;  Surgeon: Georgina Ozell LABOR, MD;  Location: [facility] OR;  Service: Orthopedics;  Laterality: N/A;   UPPER GASTROINTESTINAL ENDOSCOPY  2010   [facility]    VIDEO BRONCHOSCOPY WITH ENDOBRONCHIAL ULTRASOUND N/A 04/07/2023   Procedure: VIDEO BRONCHOSCOPY WITH ENDOBRONCHIAL ULTRASOUND;  Surgeon: Shelah Lamar RAMAN, MD;  Location: [facility] ENDOSCOPY;  Service: Pulmonary;  Laterality: N/A;   Social History   Social History Narrative   ** Merged History Encounter **       Immunization History  Administered Date(s) Administered   Fluad Quad(high Dose 65+) 12/25/2018, 12/16/2019, 01/02/2021   Fluad Trivalent(High Dose 65+) 01/21/2023   INFLUENZA, HIGH DOSE SEASONAL PF 01/07/2016, 12/17/2016, 12/21/2017   Influenza Whole 02/08/2007, 12/01/2008   Influenza,inj,Quad PF,6+ Mos 12/21/2012,  10/26/2013   Influenza-Unspecified 12/17/2016   Moderna Covid-19 Vaccine Bivalent Booster 32yrs & up 01/05/2021   Moderna SARS-COV2 Booster Vaccination 02/06/2020, 06/19/2020   Moderna Sars-Covid-2 Vaccination 05/13/2019, 06/13/2019   Pneumococcal Conjugate-13 01/07/2016   Pneumococcal Polysaccharide-23 12/17/2016   Tdap 03/06/2021   Zoster Recombinant(Shingrix) 03/06/2021, 05/06/2021     Objective: Vital Signs: BP 130/88   Pulse 91   Temp 98.3 F (36.8 C) (Temporal)   Resp 16   Ht 5' 3 (1.6 m)   Wt 181 lb 9.6 oz (82.4 kg)   BMI 32.17 kg/m    Physical Exam Vitals and nursing note reviewed.  Constitutional:      Appearance: She is well-developed.  HENT:     Head: Normocephalic and atraumatic.  Eyes:     Conjunctiva/sclera: Conjunctivae normal.  Cardiovascular:  Rate and Rhythm: Normal rate and regular rhythm.     Heart sounds: Normal heart sounds.  Pulmonary:     Effort: Pulmonary effort is normal.     Breath sounds: Normal breath sounds.  Abdominal:     General: Bowel sounds are normal.     Palpations: Abdomen is soft.  Musculoskeletal:     Cervical back: Normal range of motion.  Lymphadenopathy:     Cervical: No cervical adenopathy.  Skin:    General: Skin is warm and dry.     Capillary Refill: Capillary refill takes less than 2 seconds.  Neurological:     Mental Status: She is alert and oriented to person, place, and time.  Psychiatric:        Behavior: Behavior normal.      Musculoskeletal Exam: She had good range of motion of the cervical spine.  She had limited range of motion of the lumbar spine.  Shoulders, elbows, wrists, MCPs PIPs and DIPs were in good range of motion without any warmth swelling or synovitis.  Hip joints and knee joints with good range of motion without any warmth swelling or effusion.  There was no tenderness over ankles or MTPs.  CDAI Exam: CDAI Score: -- Patient Global: --; Provider Global: -- Swollen: --; Tender: -- Joint  Exam 11/12/2023   No joint exam has been documented for this visit   There is currently no information documented on the homunculus. Go to the Rheumatology activity and complete the homunculus joint exam.  Investigation: No additional findings.  Imaging: MR THORACIC SPINE W WO CONTRAST Result Date: 10/30/2023 CLINICAL DATA:  Bone mass or bone pain, aggressive features, history lung cancer EXAM: MRI THORACIC WITHOUT AND WITH CONTRAST TECHNIQUE: Multiplanar and multiecho pulse sequences of the thoracic spine were obtained without and with intravenous contrast. CONTRAST:  8mL GADAVIST  GADOBUTROL  1 MMOL/ML IV SOLN COMPARISON:  CT October 08, 2023 FINDINGS: Alignment: Normal Bone marrow signal: There is a rounded lesion in the anterior aspect of the T8 vertebra. The lesion has low signal on T1, high signal on T2 and enhances. No other bone lesions are identified. Thoracic spinal cord: Normal Facet joints: No significant abnormality Intervertebral discs: Previous thoracolumbar fusion that begins at T10. Paraspinal tissues: There is parenchymal disease in the right lung as seen on the previous chest CT IMPRESSION: T8 vertebral lesion suspicious for metastatic disease Electronically Signed   By: Nancyann Burns M.D.   On: 10/30/2023 15:40   XR Foot 2 Views Left Result Date: 10/16/2023 First MTP, PIP and DIP narrowing was noted.  No intertarsal, tibiotalar or subtalar joint space narrowing was noted.  No erosive changes were noted. 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  XR Foot 2 Views Right Result Date: 10/16/2023 First MTP, PIP and DIP narrowing was noted.  No intertarsal, tibiotalar or subtalar joint space narrowing was noted.  No erosive changes were noted. 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  XR Hand 2 View Left Result Date: 10/16/2023 CMC, PIP and DIP narrowing was noted.  No MCP, intercarpal or radiocarpal joint space narrowing was noted.   No erosive changes were noted. 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  XR Hand 2 View Right Result Date: 10/16/2023 First MCP narrowing and subluxation was noted.  PIP and DIP narrowing was noted.  CMC narrowing was noted.  No intercarpal or radiocarpal joint space narrowing was noted.  No erosive changes were noted. Impression: These findings suggestive  of osteoarthritis of the hand.   Recent Labs: Lab Results  Component Value Date   WBC 4.7 11/10/2023   HGB 11.4 (L) 11/10/2023   PLT 220 11/10/2023   NA 140 11/10/2023   K 4.0 11/10/2023   CL 106 11/10/2023   CO2 29 11/10/2023   GLUCOSE 257 (H) 11/10/2023   BUN 18 11/10/2023   CREATININE 0.96 11/10/2023   BILITOT 0.7 11/10/2023   ALKPHOS 86 11/10/2023   AST 12 (L) 11/10/2023   ALT 10 11/10/2023   PROT 8.1 11/10/2023   ALBUMIN  3.8 11/10/2023   CALCIUM  9.2 11/10/2023   GFRAA >60 12/24/2016   October 16, 2023 ANA 1: 80 cytoplasmic, dsDNA 14, SCL 70 1.2 (Smith, RNP, SSA, SSB) negative, sed rate 41, RF 22, anti-CCP<16   Speciality Comments: PLQ eye exam scheduled for 12/24/2023 per patient  Procedures:  No procedures performed Allergies: Aspirin and Tramadol   Assessment / Plan:     Visit Diagnoses: Rheumatoid arthritis with positive rheumatoid factor, involving unspecified site [facility]) - dxd 2008 by Dr. Ishmael.  She has been on Plaquenil  200 mg p.o. daily since then.  Patient was getting the prescription through Dr. Johnny for the last several years.  She denies having a flare of rheumatoid arthritis.  She had no synovitis on the examination today.  A prescription refill for Plaquenil  200 mg p.o. daily 90-day supply with 1 refill was sent today.  Patient states her eye examination is scheduled in October.  High risk medication use - Plaquenil  200 mg p.o. daily. -Patient has an eye examination scheduled in October.  She has not had an eye examination for several years.  I advised her to get labs in a month,  3 months and then every 5 months.  Plan: CBC with Differential/Platelet, Comprehensive metabolic panel with GFR  Positive ANA (antinuclear antibody) - Positive ANA, positive double-stranded DNA, positive SCL 70.  She has no clinical features of lupus.  She gives history of dry mouth and dry eyes.  SSA and SSB antibodies were negative.  She also gives history of photosensitivity.  Will obtain additional labs. - Plan: C3 and C4, Beta-2 glycoprotein antibodies, Cardiolipin antibodies, IgG, IgM, IgA, Lupus Anticoagulant Eval w/Reflex, urine protein creatinine ratio with next labs.  Pain in both hands - History of intermittent discomfort.  X-rays obtained at the last visit were suggestive of osteoarthritis.  X-ray findings were reviewed with the patient.  No synovitis was noted on the examination.  Pain in both feet - Intermittent discomfort.  X-rays obtained at the last visit with history of osteoarthritis.  X-ray findings were reviewed with the patient.  No synovitis was noted on the examination.  S/P lumbar fusion - Lower back pain improved after the fusion.  HEPATITIS C - Treated per patient.  Malignant neoplasm of bronchus of right lower lobe [facility]) - Diagnosed February 2025.  Status postchemotherapy and radiation therapy per patient.  Other medical problems are listed as follows:  Type 2 diabetes mellitus treated without insulin  (HCC)  Diabetic polyneuropathy associated with type 2 diabetes mellitus (HCC)  Essential hypertension  Gastroesophageal reflux disease without esophagitis  Sensorineural hearing loss, bilateral  Former smoker - Half pack per day for 40 years send quit smoking in 2023.  Orders: Orders Placed This Encounter  Procedures   CBC with Differential/Platelet   Comprehensive metabolic panel with GFR   C3 and C4   Beta-2 glycoprotein antibodies   Cardiolipin antibodies, IgG, IgM, IgA   Lupus Anticoagulant Eval w/Reflex  Protein / creatinine ratio, urine   Meds  ordered this encounter  Medications   hydroxychloroquine  (PLAQUENIL ) 200 MG tablet    Sig: Take 1 tablet (200 mg total) by mouth daily.    Dispense:  90 tablet    Refill:  1     Follow-Up Instructions: Return in about 3 months (around 02/11/2024) for Rheumatoid arthritis.   Maya Nash, MD  Note - This record has been created using Animal nutritionist.  Chart creation errors have been sought, but may not always  have been located. Such creation errors do not reflect on  the standard of medical care.

## 2023-11-04 ENCOUNTER — Encounter: Payer: Self-pay | Admitting: Endocrinology

## 2023-11-04 ENCOUNTER — Ambulatory Visit (INDEPENDENT_AMBULATORY_CARE_PROVIDER_SITE_OTHER): Admitting: Endocrinology

## 2023-11-04 ENCOUNTER — Encounter: Payer: Self-pay | Admitting: Internal Medicine

## 2023-11-04 ENCOUNTER — Ambulatory Visit: Payer: Self-pay | Admitting: Endocrinology

## 2023-11-04 VITALS — BP 118/60 | HR 94 | Ht 63.0 in | Wt 181.4 lb

## 2023-11-04 DIAGNOSIS — E1165 Type 2 diabetes mellitus with hyperglycemia: Secondary | ICD-10-CM | POA: Diagnosis not present

## 2023-11-04 DIAGNOSIS — Z794 Long term (current) use of insulin: Secondary | ICD-10-CM

## 2023-11-04 LAB — POCT GLYCOSYLATED HEMOGLOBIN (HGB A1C): Hemoglobin A1C: 7.6 % — AB (ref 4.0–5.6)

## 2023-11-04 MED ORDER — TRESIBA FLEXTOUCH 100 UNIT/ML ~~LOC~~ SOPN: 20.0000 [IU] | PEN_INJECTOR | Freq: Every day | SUBCUTANEOUS | 4 refills | Status: AC
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
## 2023-11-04 NOTE — Patient Instructions (Addendum)
 Latest Reference Range & Units Most Recent 07/29/23 16:08 11/04/23 15:38  Hemoglobin A1C 4.0 - 5.6 % -  6.9 ! 07/29/23 16:08 Pend 07/29/23 16:08 6.9 ! Pend 7.6 !  !: Data is abnormal  Continue Januvia  100mg  daily.  Lantus  20 units daily in the morning.   Glipizide  to 5 mg two times a day.   Check blood sugar before meals and at bedtime.  Bring glucometer in the follow-up visit.

## 2023-11-05 NOTE — Progress Notes (Signed)
 [facility] OFFICE PROGRESS NOTE  [name] LABOR, MD [address] [place] [address]  DIAGNOSIS:  1) Stage IIIa (T1c, N2, M0) non-small cell lung cancer, adenocarcinoma presented with right lower lobe lung nodule in addition to right paratracheal lymphadenopathy diagnosed in February 2025.   Molecular studies by [id number] showed no actionable mutations and negative PD-L1 expression.   2) history of rheumatoid arthritis and currently on treatment with Plaquenil .  PRIOR THERAPY: A course of concurrent chemoradiation with weekly carboplatin  for AUC of 2 and paclitaxel  45 Mg/M2. First dose May 04, 2023. Status post 7 cycles. Last dose was given on 06/15/2023.   CURRENT THERAPY: Consolidation treatment with immunotherapy with Imfinzi  1500 Mg IV every 4 weeks status post 4 cycles.   INTERVAL HISTORY: RANITA STJULIEN 78 y.o. female returns to the clinic today for a follow-up visit accompanied by her niece.  The patient was last seen by Dr. Sherrod on 10/13/23. The patient is currently undergoing immunotherapy with Imfinzi .  She is status post 4 cycles and has tolerated well.  At her last appointment, her restaging CT scan showed a new lytic lesion at T8. Therefore, Dr. Sherrod recommended a MRI to further evaluate this. This was reported to be suspicious. She denies any pain. She had lumbar back surgery in 2024.   Denies any fever, chills, or unexplained weight loss. She reports a pretty good appetite.She does report allergies and uses Xyzal  and flonase . She believes allergies is causing her breathing issues. She sometimes gets the sensation of phlegm stuck in her throat causing her to cough but denies hemoptysis.   She denies recent fevers, chills, night sweats, nausea, vomiting, diarrhea, constipation, and rashes. Her appetite is good, and her weight is stable. She is on insulin  therapy, with her dosage recently increased from 15 to 20 units, and her glipizide  reduced  from 10 mg to 5 mg. She monitors her blood sugar regularly.  She is here today for evaluation and repeat blood work before undergoing cycle #5.   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 Jon Jacob     ALLERGIES:  is allergic to aspirin and tramadol.  MEDICATIONS:  Current Outpatient Medications  Medication Sig Dispense Refill   ACCU-CHEK GUIDE TEST test strip USE TO TEST ONCE DAILY 100 strip 1   Accu-Chek Softclix Lancets lancets Test once per day and diagnosis code is E 11.9 and dispense for (ACCU-CHEK Soft Touch) 100 each 12   ALPRAZolam  (XANAX ) 1 MG tablet TAKE 1 TABLET (1 MG TOTAL) BY MOUTH AT BEDTIME AS NEEDED FOR ANXIETY OR SLEEP. 30 tablet 5   amLODipine  (NORVASC ) 10 MG tablet Take 1 tablet (10 mg total) by  mouth daily. 90 tablet 3   atorvastatin  (LIPITOR) 10 MG tablet TAKE 1 TABLET BY MOUTH EVERY DAY 90 tablet 3   Blood Glucose Monitoring Suppl (ACCU-CHEK GUIDE) w/Device KIT Use to test sugars daily. 1 kit 0   Blood Glucose Monitoring Suppl DEVI 1 each by Does not apply route in the morning, at noon, and at bedtime. May substitute to any manufacturer covered by patient's insurance. 1 each 0   Blood Pressure Monitor KIT Use to check blood pressure. 1 kit 0   Cholecalciferol (VITAMIN D3) 20 MCG (800 UNIT) TABS Take 800 Units by mouth daily.     DULoxetine   (CYMBALTA ) 30 MG capsule Take 1 capsule (30 mg total) by mouth at bedtime.     fluticasone  (FLONASE ) 50 MCG/ACT nasal spray SPRAY 2 SPRAYS INTO EACH NOSTRIL EVERY DAY 48 mL 3   furosemide  (LASIX ) 20 MG tablet Take 1 tablet (20 mg total) by mouth daily as needed (fluid retention). 90 tablet 3   glipiZIDE  (GLUCOTROL ) 5 MG tablet Take 1 tablet (5 mg total) by mouth 2 (two) times daily before a meal. 180 tablet 3   HYDROcodone -acetaminophen  (NORCO) 10-325 MG tablet Take 1 tablet by mouth every 6 (six) hours as needed for moderate pain (pain score 4-6). 120 tablet 0   HYDROcodone -acetaminophen  (NORCO) 10-325 MG tablet Take 1 tablet by mouth every 6 (six) hours as needed for moderate pain (pain score 4-6). 120 tablet 0   HYDROcodone -acetaminophen  (NORCO) 10-325 MG tablet Take 1 tablet by mouth every 6 (six) hours as needed for moderate pain (pain score 4-6). 120 tablet 0   hydroxychloroquine  (PLAQUENIL ) 200 MG tablet Take 1 tablet (200 mg total) by mouth daily. 90 tablet 1   insulin  degludec (TRESIBA  FLEXTOUCH) 100 UNIT/ML FlexTouch Pen Inject 20 Units into the skin daily. 15 mL 4   Insulin  Pen Needle 32G X 4 MM MISC Use 4x a day 300 each 3   Lancets (ACCU-CHEK SOFT TOUCH) lancets Test once per day and diagnosis code is E 11.9 and dispense for Aviva plus 100 each 3   levocetirizine (XYZAL ) 5 MG tablet TAKE 1 TABLET BY MOUTH EVERY DAY IN THE EVENING 90 tablet 3   levocetirizine (XYZAL ) 5 MG tablet Take 1 tablet (5 mg total) by mouth every evening. 90 tablet 3   mupirocin  ointment (BACTROBAN ) 2 % Apply 1 Application topically 2 (two) times daily. 22 g 5   omeprazole  (PRILOSEC) 40 MG capsule TAKE 1 CAPSULE BY MOUTH EVERY DAY 90 capsule 3   ondansetron  (ZOFRAN ) 4 MG tablet Take 1 tablet (4 mg total) by mouth every 8 (eight) hours as needed for nausea or vomiting. 90 tablet 3   polyethylene glycol (MIRALAX  / GLYCOLAX ) 17 g packet Take 17 g by mouth daily as needed for mild constipation.     potassium chloride   (KLOR-CON ) 10 MEQ tablet TAKE 1 TABLET BY MOUTH EVERY DAY 90 tablet 3   pregabalin  (LYRICA ) 100 MG capsule Take 1 capsule (100 mg total) by mouth at bedtime. 90 capsule 1   RESTASIS  0.05 % ophthalmic emulsion Place 1 drop into both eyes 2 (two) times daily.     sitaGLIPtin  (JANUVIA ) 100 MG tablet Take 1 tablet (100 mg total) by mouth at bedtime. 90 tablet 3   Ginkgo Biloba (GNP GINGKO BILOBA EXTRACT PO) Take by mouth. (Patient not taking: Reported on 11/10/2023)     metoCLOPramide  (REGLAN ) 10 MG tablet Take 1 tablet (10 mg total) by mouth 3 (three) times daily with meals  for 5 days. (Patient not taking: Reported on 11/04/2023) 15 tablet 0   sucralfate  (CARAFATE ) 1 g tablet Take 1 tablet (1 g total) by mouth 4 (four) times daily. Dissolve each tablet in 15 cc water before use. (Patient not taking: Reported on 11/04/2023) 120 tablet 2   No current facility-administered medications for this visit.   Facility-Administered Medications Ordered in Other Visits  Medication Dose Route Frequency Provider Last Rate Last Admin   0.9 %  sodium chloride  infusion   Intravenous Continuous Sherrod Sherrod, MD 10 mL/hr at 11/10/23 1221 New Bag at 11/10/23 1221   durvalumab  (IMFINZI ) 1,500 mg in sodium chloride  0.9 % 100 mL chemo infusion  1,500 mg Intravenous Once Sherrod Sherrod, MD        SURGICAL HISTORY:  Past Surgical History:  Procedure Laterality Date   BRONCHIAL BRUSHINGS  04/07/2023   Procedure: BRONCHIAL BRUSHINGS;  Surgeon: Shelah Lamar RAMAN, MD;  Location: [facility] ENDOSCOPY;  Service: Pulmonary;;   BRONCHIAL NEEDLE ASPIRATION BIOPSY  04/07/2023   Procedure: BRONCHIAL NEEDLE ASPIRATION BIOPSIES;  Surgeon: Shelah Lamar RAMAN, MD;  Location: [facility] ENDOSCOPY;  Service: Pulmonary;;   CATARACT EXTRACTION     COLONOSCOPY  11/23/2019   per Dr. Teressa, adenmatous polyp, repeat in 7 yrs   ESOPHAGOGASTRODUODENOSCOPY  06/20/2008   at [facility], clear    FINE NEEDLE ASPIRATION  04/07/2023   Procedure: FINE NEEDLE  ASPIRATION (FNA) LINEAR;  Surgeon: Shelah Lamar RAMAN, MD;  Location: [facility] ENDOSCOPY;  Service: Pulmonary;;   IR IMAGING GUIDED PORT INSERTION  04/30/2023   LUMBAR LAMINECTOMY     1995   ROTATOR CUFF REPAIR Right    SPINAL FUSION N/A 07/29/2022   Procedure: T10-PELVIS INSTRUMENTATION/ T10-L4 POSTERIOR SPINAL FUSION;  Surgeon: Georgina Ozell LABOR, MD;  Location: [facility] OR;  Service: Orthopedics;  Laterality: N/A;   UPPER GASTROINTESTINAL ENDOSCOPY  2010   [facility]    VIDEO BRONCHOSCOPY WITH ENDOBRONCHIAL ULTRASOUND N/A 04/07/2023   Procedure: VIDEO BRONCHOSCOPY WITH ENDOBRONCHIAL ULTRASOUND;  Surgeon: Shelah Lamar RAMAN, MD;  Location: [facility] ENDOSCOPY;  Service: Pulmonary;  Laterality: N/A;    REVIEW OF SYSTEMS:   Review of Systems  Constitutional: Negative for appetite change, chills, fatigue, fever and unexpected weight change.  HENT: Negative for mouth sores, nosebleeds, sore throat and trouble swallowing.   Eyes: Negative for eye problems and icterus.  Respiratory: Occasional cough and dyspnea. Negative for hemoptysis and wheezing.   Cardiovascular: Negative for chest pain and leg swelling.  Gastrointestinal: Negative for abdominal pain, constipation, diarrhea, nausea and vomiting.  Genitourinary: Negative for bladder incontinence, difficulty urinating, dysuria, frequency and hematuria.   Musculoskeletal: Negative for back pain, gait problem, neck pain and neck stiffness.  Skin: Negative for itching and rash.  Neurological: Negative for dizziness, extremity weakness, gait problem, headaches, light-headedness and seizures.  Hematological: Negative for adenopathy. Does not bruise/bleed easily.  Psychiatric/Behavioral: Negative for confusion, depression and sleep disturbance. The patient is not nervous/anxious.     PHYSICAL EXAMINATION:  Blood pressure (!) 152/76, pulse 99, temperature 97.6 F (36.4 C), temperature source Temporal, resp. rate 16, weight 182 lb 8 oz (82.8 kg), SpO2 91%.  ECOG PERFORMANCE  STATUS: 1  Physical Exam  Constitutional: Oriented to person, place, and time and well-developed, well-nourished, and in no distress.  HENT:  Head: Normocephalic and atraumatic.  Mouth/Throat: Oropharynx is clear and moist. No oropharyngeal exudate.  Eyes: Conjunctivae are normal. Right eye exhibits no discharge. Left eye exhibits no discharge. No scleral icterus.  Neck: Normal range of motion.  Neck supple.  Cardiovascular: Normal rate, regular rhythm, normal heart sounds and intact distal pulses.   Pulmonary/Chest: Effort normal and breath sounds normal. No respiratory distress. No wheezes. No rales.  Abdominal: Soft. Bowel sounds are normal. Exhibits no distension and no mass. There is no tenderness.  Musculoskeletal: Normal range of motion. Exhibits no edema.  Lymphadenopathy:    No cervical adenopathy.  Neurological: Alert and oriented to person, place, and time. Exhibits normal muscle tone. Gait normal. Coordination normal.  Skin: Skin is warm and dry. No rash noted. Not diaphoretic. No erythema. No pallor.  Psychiatric: Mood, memory and judgment normal.  Vitals reviewed.  LABORATORY DATA: Lab Results  Component Value Date   WBC 4.7 11/10/2023   HGB 11.4 (L) 11/10/2023   HCT 36.1 11/10/2023   MCV 76.6 (L) 11/10/2023   PLT 220 11/10/2023      Chemistry      Component Value Date/Time   NA 140 11/10/2023 1106   K 4.0 11/10/2023 1106   CL 106 11/10/2023 1106   CO2 29 11/10/2023 1106   BUN 18 11/10/2023 1106   CREATININE 0.96 11/10/2023 1106   CREATININE 1.07 (H) 09/16/2019 1511      Component Value Date/Time   CALCIUM  9.2 11/10/2023 1106   ALKPHOS 86 11/10/2023 1106   AST 12 (L) 11/10/2023 1106   ALT 10 11/10/2023 1106   BILITOT 0.7 11/10/2023 1106       RADIOGRAPHIC STUDIES:  MR THORACIC SPINE W WO CONTRAST Result Date: 10/30/2023 CLINICAL DATA:  Bone mass or bone pain, aggressive features, history lung cancer EXAM: MRI THORACIC WITHOUT AND WITH CONTRAST  TECHNIQUE: Multiplanar and multiecho pulse sequences of the thoracic spine were obtained without and with intravenous contrast. CONTRAST:  8mL GADAVIST  GADOBUTROL  1 MMOL/ML IV SOLN COMPARISON:  CT October 08, 2023 FINDINGS: Alignment: Normal Bone marrow signal: There is a rounded lesion in the anterior aspect of the T8 vertebra. The lesion has low signal on T1, high signal on T2 and enhances. No other bone lesions are identified. Thoracic spinal cord: Normal Facet joints: No significant abnormality Intervertebral discs: Previous thoracolumbar fusion that begins at T10. Paraspinal tissues: There is parenchymal disease in the right lung as seen on the previous chest CT IMPRESSION: T8 vertebral lesion suspicious for metastatic disease Electronically Signed   By: Nancyann Burns M.D.   On: 10/30/2023 15:40   XR Foot 2 Views Left Result Date: 10/16/2023 First MTP, PIP and DIP narrowing was noted.  No intertarsal, tibiotalar or subtalar joint space narrowing was noted.  No erosive changes were noted. 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  XR Foot 2 Views Right Result Date: 10/16/2023 First MTP, PIP and DIP narrowing was noted.  No intertarsal, tibiotalar or subtalar joint space narrowing was noted.  No erosive changes were noted. 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  XR Hand 2 View Left Result Date: 10/16/2023 CMC, PIP and DIP narrowing was noted.  No MCP, intercarpal or radiocarpal joint space narrowing was noted.  No erosive changes were noted. 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  XR Hand 2 View Right Result Date: 10/16/2023 First MCP narrowing and subluxation was noted.  PIP and DIP narrowing was noted.  CMC narrowing was noted.  No intercarpal or radiocarpal joint space narrowing was noted.  No erosive changes were noted. 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    ASSESSMENT/PLAN:   This is a very pleasant  78 year old African-American female with stage IIIa (T1c, N2, M0) non-small cell lung cancer, adenocarcinoma.  She presented with a right lower lobe lung nodule in addition to right paratracheal lymphadenopathy.  She was diagnosed in February 2025.  Her molecular studies by [email or web address] showed no actionable mutations and a negative PD-L1 expression.   The patient has a history of rheumatoid arthritis and is currently on treatment with Plaquenil .   The patient underwent a course of concurrent chemoradiation with weekly carboplatin  for an AUC of 2 and paclitaxel  45 mg/m.  The patient's first dose was on 05/04/2023.  She is status post 7 cycles of treatment.  The last dose was on 06/15/2023.   She is currently on consolidation immunotherapy with Imfinzi  5000 mg IV every 4 weeks.  She status post 4 cycles.  I reviewed her scan with Dr. Sherrod. He recommended PET scan. If this lesion is hypermetabolic, then he would recommend referring her to radiation.    Labs were reviewed.  Recommend that she proceed with cycle number 5 today as scheduled.   We will see her back for labs and a follow-up visit in 4 weeks for evaluation repeat blood work before undergoing cycle #6.  She was advised to monitor her blood sugar closely at home and take her insulin  as prescribed.   Allergic rhinitis Symptoms likely due to allergies. Using Xyzal  and Flonase  for management.  The patient was advised to call immediately if she has any concerning symptoms in the interval. The patient voices understanding of current disease status and treatment options and is in agreement with the current care plan. All questions were answered. The patient knows to call the clinic with any problems, questions or concerns. We can certainly see the patient much sooner if necessary    Orders Placed This Encounter  Procedures   NM PET Image Restag (PS) Skull Base To Thigh    Standing Status:   Future    Expected  Date:   11/17/2023    Expiration Date:   11/09/2024    If indicated for the ordered procedure, I authorize the administration of a radiopharmaceutical per Radiology protocol:   Yes    Preferred imaging location?:   [facility]    The total time spent in the appointment was 20-29 minutes  Dee Maday L Krystopher Kuenzel, PA-C 11/10/23

## 2023-11-06 ENCOUNTER — Encounter: Payer: Self-pay | Admitting: Internal Medicine

## 2023-11-10 ENCOUNTER — Inpatient Hospital Stay

## 2023-11-10 ENCOUNTER — Inpatient Hospital Stay: Attending: Internal Medicine

## 2023-11-10 ENCOUNTER — Inpatient Hospital Stay (HOSPITAL_BASED_OUTPATIENT_CLINIC_OR_DEPARTMENT_OTHER): Admitting: Physician Assistant

## 2023-11-10 VITALS — BP 152/76 | HR 99 | Temp 97.6°F | Resp 16 | Wt 182.5 lb

## 2023-11-10 DIAGNOSIS — Z9221 Personal history of antineoplastic chemotherapy: Secondary | ICD-10-CM | POA: Diagnosis not present

## 2023-11-10 DIAGNOSIS — Z5112 Encounter for antineoplastic immunotherapy: Secondary | ICD-10-CM

## 2023-11-10 DIAGNOSIS — C3431 Malignant neoplasm of lower lobe, right bronchus or lung: Secondary | ICD-10-CM

## 2023-11-10 DIAGNOSIS — Z886 Allergy status to analgesic agent status: Secondary | ICD-10-CM | POA: Diagnosis not present

## 2023-11-10 DIAGNOSIS — C7951 Secondary malignant neoplasm of bone: Secondary | ICD-10-CM

## 2023-11-10 DIAGNOSIS — Z8619 Personal history of other infectious and parasitic diseases: Secondary | ICD-10-CM | POA: Diagnosis not present

## 2023-11-10 DIAGNOSIS — R590 Localized enlarged lymph nodes: Secondary | ICD-10-CM | POA: Insufficient documentation

## 2023-11-10 DIAGNOSIS — M899 Disorder of bone, unspecified: Secondary | ICD-10-CM | POA: Insufficient documentation

## 2023-11-10 DIAGNOSIS — J309 Allergic rhinitis, unspecified: Secondary | ICD-10-CM | POA: Insufficient documentation

## 2023-11-10 DIAGNOSIS — Z79620 Long term (current) use of immunosuppressive biologic: Secondary | ICD-10-CM | POA: Insufficient documentation

## 2023-11-10 DIAGNOSIS — M069 Rheumatoid arthritis, unspecified: Secondary | ICD-10-CM | POA: Insufficient documentation

## 2023-11-10 DIAGNOSIS — I10 Essential (primary) hypertension: Secondary | ICD-10-CM | POA: Diagnosis not present

## 2023-11-10 DIAGNOSIS — Z885 Allergy status to narcotic agent status: Secondary | ICD-10-CM | POA: Insufficient documentation

## 2023-11-10 DIAGNOSIS — Z923 Personal history of irradiation: Secondary | ICD-10-CM | POA: Insufficient documentation

## 2023-11-10 DIAGNOSIS — Z79899 Other long term (current) drug therapy: Secondary | ICD-10-CM | POA: Diagnosis not present

## 2023-11-10 LAB — CBC WITH DIFFERENTIAL (CANCER CENTER ONLY)
Abs Immature Granulocytes: 0.03 K/uL (ref 0.00–0.07)
Basophils Absolute: 0 K/uL (ref 0.0–0.1)
Basophils Relative: 1 %
Eosinophils Absolute: 0 K/uL (ref 0.0–0.5)
Eosinophils Relative: 0 %
HCT: 36.1 % (ref 36.0–46.0)
Hemoglobin: 11.4 g/dL — ABNORMAL LOW (ref 12.0–15.0)
Immature Granulocytes: 1 %
Lymphocytes Relative: 22 %
Lymphs Abs: 1 K/uL (ref 0.7–4.0)
MCH: 24.2 pg — ABNORMAL LOW (ref 26.0–34.0)
MCHC: 31.6 g/dL (ref 30.0–36.0)
MCV: 76.6 fL — ABNORMAL LOW (ref 80.0–100.0)
Monocytes Absolute: 0.8 K/uL (ref 0.1–1.0)
Monocytes Relative: 16 %
Neutro Abs: 2.9 K/uL (ref 1.7–7.7)
Neutrophils Relative %: 60 %
Platelet Count: 220 K/uL (ref 150–400)
RBC: 4.71 MIL/uL (ref 3.87–5.11)
RDW: 19.9 % — ABNORMAL HIGH (ref 11.5–15.5)
WBC Count: 4.7 K/uL (ref 4.0–10.5)
nRBC: 0 % (ref 0.0–0.2)

## 2023-11-10 LAB — CMP (CANCER CENTER ONLY)
ALT: 10 U/L (ref 0–44)
AST: 12 U/L — ABNORMAL LOW (ref 15–41)
Albumin: 3.8 g/dL (ref 3.5–5.0)
Alkaline Phosphatase: 86 U/L (ref 38–126)
Anion gap: 5 (ref 5–15)
BUN: 18 mg/dL (ref 8–23)
CO2: 29 mmol/L (ref 22–32)
Calcium: 9.2 mg/dL (ref 8.9–10.3)
Chloride: 106 mmol/L (ref 98–111)
Creatinine: 0.96 mg/dL (ref 0.44–1.00)
GFR, Estimated: >60 mL/min (ref >60)
Glucose, Bld: 257 mg/dL — ABNORMAL HIGH (ref 70–99)
Potassium: 4 mmol/L (ref 3.5–5.1)
Sodium: 140 mmol/L (ref 135–145)
Total Bilirubin: 0.7 mg/dL (ref 0.0–1.2)
Total Protein: 8.1 g/dL (ref 6.5–8.1)

## 2023-11-10 MED ORDER — SODIUM CHLORIDE 0.9 % IV SOLN
1500.0000 mg | Freq: Once | INTRAVENOUS | Status: AC
  Administered 2023-11-10: 1500 mg via INTRAVENOUS
  Filled 2023-11-10: qty 30

## 2023-11-10 MED ORDER — SODIUM CHLORIDE 0.9 % IV SOLN: INTRAVENOUS | Status: DC

## 2023-11-10 NOTE — Patient Instructions (Signed)
 [facility] - A DEPT OF [facility]  Discharge Instructions: Thank you for choosing [facility] to provide your oncology and hematology care.   If you have a lab appointment with the Cancer Center, please go directly to the Cancer Center and check in at the registration area.   Wear comfortable clothing and clothing appropriate for easy access to any Portacath or PICC line.   We strive to give you quality time with your provider. You may need to reschedule your appointment if you arrive late (15 or more minutes).  Arriving late affects you and other patients whose appointments are after yours.  Also, if you miss three or more appointments without notifying the office, you may be dismissed from the clinic at the provider's discretion.      For prescription refill requests, have your pharmacy contact our office and allow 72 hours for refills to be completed.    Today you received the following chemotherapy and/or immunotherapy agents: durvalumab (IMFINZI)       To help prevent nausea and vomiting after your treatment, we encourage you to take your nausea medication as directed.  BELOW ARE SYMPTOMS THAT SHOULD BE REPORTED IMMEDIATELY: *FEVER GREATER THAN 100.4 F (38 C) OR HIGHER *CHILLS OR SWEATING *NAUSEA AND VOMITING THAT IS NOT CONTROLLED WITH YOUR NAUSEA MEDICATION *UNUSUAL SHORTNESS OF BREATH *UNUSUAL BRUISING OR BLEEDING *URINARY PROBLEMS (pain or burning when urinating, or frequent urination) *BOWEL PROBLEMS (unusual diarrhea, constipation, pain near the anus) TENDERNESS IN MOUTH AND THROAT WITH OR WITHOUT PRESENCE OF ULCERS (sore throat, sores in mouth, or a toothache) UNUSUAL RASH, SWELLING OR PAIN  UNUSUAL VAGINAL DISCHARGE OR ITCHING   Items with * indicate a potential emergency and should be followed up as soon as possible or go to the Emergency Department if any problems should occur.  Please show the CHEMOTHERAPY ALERT CARD or  IMMUNOTHERAPY ALERT CARD at check-in to the Emergency Department and triage nurse.  Should you have questions after your visit or need to cancel or reschedule your appointment, please contact [facility] - A DEPT OF [facility]  Dept: [phone]  and follow the prompts.  Office hours are 8:00 a.m. to 4:30 p.m. Monday - Friday. Please note that voicemails left after 4:00 p.m. may not be returned until the following business day.  We are closed weekends and major holidays. You have access to a nurse at all times for urgent questions. Please call the main number to the clinic Dept: [phone] and follow the prompts.   For any non-urgent questions, you may also contact your provider using MyChart. We now offer e-Visits for anyone 39 and older to request care online for non-urgent symptoms. For details visit [email or web address].   Also download the MyChart app! Go to the app store, search "MyChart", open the app, select [place], and log in with your MyChart username and password.

## 2023-11-11 ENCOUNTER — Telehealth: Payer: Self-pay | Admitting: Internal Medicine

## 2023-11-11 NOTE — Telephone Encounter (Signed)
 Scheduled appointments per WQ. Talked with the patient and she is aware of the made appointments.

## 2023-11-12 ENCOUNTER — Encounter: Payer: Self-pay | Admitting: Rheumatology

## 2023-11-12 ENCOUNTER — Other Ambulatory Visit: Payer: Self-pay | Admitting: Family Medicine

## 2023-11-12 ENCOUNTER — Ambulatory Visit: Payer: Self-pay

## 2023-11-12 ENCOUNTER — Ambulatory Visit: Attending: Rheumatology | Admitting: Rheumatology

## 2023-11-12 VITALS — BP 130/88 | HR 91 | Temp 98.3°F | Resp 16 | Ht 63.0 in | Wt 181.6 lb

## 2023-11-12 DIAGNOSIS — M069 Rheumatoid arthritis, unspecified: Secondary | ICD-10-CM

## 2023-11-12 DIAGNOSIS — M79671 Pain in right foot: Secondary | ICD-10-CM | POA: Diagnosis not present

## 2023-11-12 DIAGNOSIS — M059 Rheumatoid arthritis with rheumatoid factor, unspecified: Secondary | ICD-10-CM | POA: Diagnosis not present

## 2023-11-12 DIAGNOSIS — C3431 Malignant neoplasm of lower lobe, right bronchus or lung: Secondary | ICD-10-CM | POA: Diagnosis not present

## 2023-11-12 DIAGNOSIS — R768 Other specified abnormal immunological findings in serum: Secondary | ICD-10-CM | POA: Diagnosis not present

## 2023-11-12 DIAGNOSIS — E1142 Type 2 diabetes mellitus with diabetic polyneuropathy: Secondary | ICD-10-CM

## 2023-11-12 DIAGNOSIS — Z79899 Other long term (current) drug therapy: Secondary | ICD-10-CM

## 2023-11-12 DIAGNOSIS — I10 Essential (primary) hypertension: Secondary | ICD-10-CM | POA: Diagnosis not present

## 2023-11-12 DIAGNOSIS — M79641 Pain in right hand: Secondary | ICD-10-CM | POA: Diagnosis not present

## 2023-11-12 DIAGNOSIS — Z87891 Personal history of nicotine dependence: Secondary | ICD-10-CM

## 2023-11-12 DIAGNOSIS — E119 Type 2 diabetes mellitus without complications: Secondary | ICD-10-CM | POA: Diagnosis not present

## 2023-11-12 DIAGNOSIS — M79642 Pain in left hand: Secondary | ICD-10-CM

## 2023-11-12 DIAGNOSIS — M79672 Pain in left foot: Secondary | ICD-10-CM

## 2023-11-12 DIAGNOSIS — K219 Gastro-esophageal reflux disease without esophagitis: Secondary | ICD-10-CM | POA: Diagnosis not present

## 2023-11-12 DIAGNOSIS — Z981 Arthrodesis status: Secondary | ICD-10-CM | POA: Diagnosis not present

## 2023-11-12 DIAGNOSIS — H903 Sensorineural hearing loss, bilateral: Secondary | ICD-10-CM

## 2023-11-12 DIAGNOSIS — B1710 Acute hepatitis C without hepatic coma: Secondary | ICD-10-CM

## 2023-11-12 MED ORDER — HYDROXYCHLOROQUINE SULFATE 200 MG PO TABS: 200.0000 mg | ORAL_TABLET | Freq: Every day | ORAL | 1 refills | Status: AC

## 2023-11-12 NOTE — Telephone Encounter (Signed)
 FYI Only or Action Required?: FYI only for provider.  Patient was last seen in primary care on 09/08/2023 by Wendy [name] LABOR, MD.  Called Nurse Triage reporting Pain.  Symptoms began several years ago.  Interventions attempted: OTC medications: Tylenol  and Prescription medications: Norco.  Symptoms are: gradually worsening.  Triage Disposition: See HCP Within 4 Hours (Or PCP Triage)  Patient/caregiver understands and will follow disposition?: No, wishes to speak with PCP     Copied from CRM #[id number]. Topic: Clinical - Red Word Triage >> Nov 12, 2023  2:20 PM Mia F wrote: Red Word that prompted transfer to Nurse Triage: Pain all over her body. Pt is out of her medication HYDROcodone -acetaminophen  (NORCO) 10-325 MG tablet which is to help with the pain all over. Pain is located in the chest, back, arms, legs, and feet. Pain worsens when she goes to chemo. Reason for Disposition  [1] SEVERE back pain (e.g., excruciating, unable to do any normal activities) AND [2] not improved 2 hours after pain medicine  Answer Assessment - Initial Assessment Questions Had back surgery last year. Patient states she had chemo the other day and has had feet, arms, and side pain. Feet and legs also feel a little numb as well. Patient requesting a refill on HYDROcodone -acetaminophen  (NORCO) 10-325 MG and refused an appointment in office or with UC. Refill sent today by this RN.    1. ONSET: When did the pain begin? (e.g., minutes, hours, days)     Last year  2. LOCATION: Where does it hurt? (upper, mid or lower back)     Mid and upper area of back  3. SEVERITY: How bad is the pain?  (e.g., Scale 1-10; mild, moderate, or severe)     8/10 4. PATTERN: Is the pain constant? (e.g., yes, no; constant, intermittent)      Constant  5. RADIATION: Does the pain shoot into your legs or somewhere else?     Radiates to legs and sides.  6. CAUSE:  What do you think is causing the back pain?      Back  surgery  7. MEDICINES: What have you taken so far for the pain? (e.g., nothing, acetaminophen , NSAIDS)     Tylenol , Norco  8. NEUROLOGIC SYMPTOMS: Do you have any weakness, numbness, or problems with bowel/bladder control?     Denies  Protocols used: Back Pain-A-AH

## 2023-11-12 NOTE — Patient Instructions (Addendum)
 Standing Labs We placed an order today for your standing lab work.   Please have your standing labs drawn in 1 month after starting Plaquenil , 3 months and then every 5 months  Please have your labs drawn 2 weeks prior to your appointment so that the provider can discuss your lab results at your appointment, if possible.  Please note that you may see your imaging and lab results in MyChart before we have reviewed them. We will contact you once all results are reviewed. Please allow our office up to 72 hours to thoroughly review all of the results before contacting the office for clarification of your results.  WALK-IN LAB HOURS  Monday through Thursday from 8:00 am -12:30 pm and 1:00 pm-4:30 pm and Friday from 8:00 am-12:00 pm.  Patients with office visits requiring labs will be seen before walk-in labs.  You may encounter longer than normal wait times. Please allow additional time. Wait times may be shorter on  Monday and Thursday afternoons.  We do not book appointments for walk-in labs. We appreciate your patience and understanding with our staff.   Labs are drawn by [facility]. Please bring your co-pay at the time of your lab draw.  You may receive a bill from [facility] for your lab work.  Please note if you are on Hydroxychloroquine  and and an order has been placed for a Hydroxychloroquine  level,  you will need to have it drawn 4 hours or more after your last dose.  If you wish to have your labs drawn at another location, please call the office 24 hours in advance so we can fax the orders.  The office is located at [address], [place], [place] [address]   If you have any questions regarding directions or hours of operation,  please call [phone].   As a reminder, please drink plenty of water prior to coming for your lab work. Thanks!   Vaccines You are taking a medication(s) that can suppress your immune system.  The following immunizations are recommended: Flu  annually Covid-19  Td/Tdap (tetanus, diphtheria, pertussis) every 10 years Pneumonia (Prevnar 15 then Pneumovax 23 at least 1 year apart.  Alternatively, can take Prevnar 20 without needing additional dose) Shingrix: 2 doses from 4 weeks to 6 months apart  Please check with your PCP to make sure you are up to date.   Positive ANA

## 2023-11-12 NOTE — Telephone Encounter (Signed)
 Copied from CRM #[id number]. Topic: Clinical - Medication Refill >> Nov 12, 2023  2:17 PM Mia F wrote: Medication: HYDROcodone -acetaminophen  (NORCO) 10-325 MG tablet   Has the patient contacted their pharmacy? Yes (Agent: If no, request that the patient contact the pharmacy for the refill. If patient does not wish to contact the pharmacy document the reason why and proceed with request.) (Agent: If yes, when and what did the pharmacy advise?)  This is the patient's preferred pharmacy:  [facility] [phone] [address], [place] - [address] [place] [address] Phone: [phone] Fax: [phone]  Is this the correct pharmacy for this prescription? Yes If no, delete pharmacy and type the correct one.   Has the prescription been filled recently? Yes  Is the patient out of the medication? Yes  Has the patient been seen for an appointment in the last year OR does the patient have an upcoming appointment? Yes  Can we respond through MyChart? Yes  Agent: Please be advised that Rx refills may take up to 3 business days. We ask that you follow-up with your pharmacy.

## 2023-11-13 ENCOUNTER — Ambulatory Visit: Payer: Self-pay

## 2023-11-13 ENCOUNTER — Other Ambulatory Visit: Payer: Self-pay | Admitting: Family Medicine

## 2023-11-13 ENCOUNTER — Encounter: Payer: Self-pay | Admitting: Internal Medicine

## 2023-11-13 ENCOUNTER — Other Ambulatory Visit (HOSPITAL_COMMUNITY): Payer: Self-pay

## 2023-11-13 ENCOUNTER — Telehealth: Payer: Self-pay

## 2023-11-13 NOTE — Telephone Encounter (Signed)
 FYI Only or Action Required?: Action required by provider: medication refill request.  Patient was last seen in primary care on 09/08/2023 by [name] LABOR, MD.  Called Nurse Triage reporting Medication Refill.  Symptoms began several days ago.  Interventions attempted: OTC medications: Tylenol .  Symptoms are: gradually worsening.  Triage Disposition: Call PCP When Office is Open  Patient/caregiver understands and will follow disposition?: Yes  **See note below, as well as previous encounter from 9/11 regarding refill requests**          Copied from CRM #[id number]. Topic: Clinical - Red Word Triage >> Nov 13, 2023  9:04 AM Adelita E wrote: [facility] that prompted transfer to Nurse Triage: Worsening pain from yesterday, pain all over body, patient having difficulty getting pain and anxiety medication. Reason for Disposition  Caller requesting a CONTROLLED substance prescription refill (e.g., narcotics, ADHD medicines)  Answer Assessment - Initial Assessment Questions 1. DRUG NAME: What medicine do you need to have refilled?   Patient called in yesterday to request a refill for her pain medication, and anxiety medication. She called in today and was advised it can take up to 3 days for refill requests. She would like the office to expedite her request.   HYDROcodone -acetaminophen  (NORCO) 10-325 MG ALPRAZolam  1 MG  Protocols used: Medication Refill and Renewal Call-A-AH

## 2023-11-13 NOTE — Telephone Encounter (Signed)
 Pharmacy Patient Advocate Encounter   Received notification from Pt Calls Messages that prior authorization for HYDROcodone -Acetaminophen  10-325MG  tablets  is required/requested.   Insurance verification completed.   The patient is insured through [employer] .   Per test claim: PA required; PA submitted to above mentioned insurance via Latent Key/confirmation #/EOC [id number] Status is pending

## 2023-11-13 NOTE — Telephone Encounter (Signed)
 Noted

## 2023-11-13 NOTE — Telephone Encounter (Signed)
 Spoke with pt advised that  a PA for Hydrocodone  was sent to her insurance plan, advised that the office will call her with an update

## 2023-11-16 ENCOUNTER — Encounter: Payer: Self-pay | Admitting: Internal Medicine

## 2023-11-16 ENCOUNTER — Other Ambulatory Visit (HOSPITAL_COMMUNITY): Payer: Self-pay

## 2023-11-16 ENCOUNTER — Other Ambulatory Visit: Payer: Self-pay

## 2023-11-16 MED ORDER — ACCU-CHEK GUIDE TEST VI STRP: ORAL_STRIP | 1 refills | Status: AC

## 2023-11-16 NOTE — Telephone Encounter (Signed)
 Spoke with pt advised to pick up Rx for Hydrocodone  from her pharmacy. Voiced understanding

## 2023-11-16 NOTE — Telephone Encounter (Signed)
 Pharmacy Patient Advocate Encounter  Received notification from [employer] that Prior Authorization for  HYDROcodone -Acetaminophen  10-325MG  tablets  has been APPROVED from 11/13/23 to 12/13/23. Ran test claim, Copay is $0. This test claim was processed through [facility]- copay amounts may vary at other pharmacies due to pharmacy/plan contracts, or as the patient moves through the different stages of their insurance plan.   PA #/Case ID/Reference #: [id number]

## 2023-11-16 NOTE — Telephone Encounter (Signed)
  Pt Rx awaiting Prior Authorization

## 2023-11-18 ENCOUNTER — Other Ambulatory Visit: Payer: Self-pay | Admitting: Family Medicine

## 2023-11-18 ENCOUNTER — Telehealth: Payer: Self-pay

## 2023-11-18 NOTE — Telephone Encounter (Signed)
 Copied from CRM #[id number]. Topic: Clinical - Prescription Issue >> Nov 18, 2023  1:20 PM Macario HERO wrote: Reason for CRM: Patient called for prescription refill for HYDROcodone -acetaminophen  (NORCO) 10-325 MG tablet [[id number]]. Patient stated she is in pain and has not received the medication, also advised it was approved by insurance.

## 2023-11-19 NOTE — Telephone Encounter (Signed)
 Spoke with pt notified to pick up Rx form her pharmacy, voiced understanding

## 2023-11-20 ENCOUNTER — Ambulatory Visit (HOSPITAL_COMMUNITY)
Admission: RE | Admit: 2023-11-20 | Discharge: 2023-11-20 | Disposition: A | Discharge status: Home / Self Care | Source: Ambulatory Visit | Attending: Physician Assistant | Admitting: Physician Assistant

## 2023-11-20 DIAGNOSIS — C7951 Secondary malignant neoplasm of bone: Secondary | ICD-10-CM | POA: Insufficient documentation

## 2023-11-20 DIAGNOSIS — C3431 Malignant neoplasm of lower lobe, right bronchus or lung: Secondary | ICD-10-CM | POA: Insufficient documentation

## 2023-11-20 DIAGNOSIS — C761 Malignant neoplasm of thorax: Secondary | ICD-10-CM | POA: Diagnosis not present

## 2023-11-20 LAB — GLUCOSE, CAPILLARY: Glucose-Capillary: 129 mg/dL — ABNORMAL HIGH (ref 70–99)

## 2023-11-20 MED ORDER — FLUDEOXYGLUCOSE F - 18 (FDG) INJECTION
9.0000 | Freq: Once | INTRAVENOUS | Status: AC | PRN
  Administered 2023-11-20: 8.9 via INTRAVENOUS

## 2023-11-23 ENCOUNTER — Telehealth: Payer: Self-pay | Admitting: Medical Oncology

## 2023-11-23 NOTE — Telephone Encounter (Signed)
 Dental appt poor dentition - Pt as dental appt tomorrow .  I am trying to get these bad teeth out of my mouth. I told her she is getting immunotherapy , not chemotherapy and make sure dentist knows that and to call us  if they need clearance. Pt voiced understanding.

## 2023-11-26 ENCOUNTER — Inpatient Hospital Stay

## 2023-11-26 ENCOUNTER — Ambulatory Visit (HOSPITAL_BASED_OUTPATIENT_CLINIC_OR_DEPARTMENT_OTHER): Admitting: Internal Medicine

## 2023-11-26 ENCOUNTER — Telehealth: Payer: Self-pay | Admitting: Medical Oncology

## 2023-11-26 VITALS — BP 136/81 | HR 91 | Temp 98.0°F | Resp 17 | Ht 63.0 in | Wt 183.0 lb

## 2023-11-26 DIAGNOSIS — C3431 Malignant neoplasm of lower lobe, right bronchus or lung: Secondary | ICD-10-CM

## 2023-11-26 DIAGNOSIS — M899 Disorder of bone, unspecified: Secondary | ICD-10-CM | POA: Diagnosis not present

## 2023-11-26 DIAGNOSIS — Z886 Allergy status to analgesic agent status: Secondary | ICD-10-CM | POA: Diagnosis not present

## 2023-11-26 DIAGNOSIS — I10 Essential (primary) hypertension: Secondary | ICD-10-CM | POA: Diagnosis not present

## 2023-11-26 DIAGNOSIS — Z9221 Personal history of antineoplastic chemotherapy: Secondary | ICD-10-CM | POA: Diagnosis not present

## 2023-11-26 DIAGNOSIS — Z5112 Encounter for antineoplastic immunotherapy: Secondary | ICD-10-CM | POA: Diagnosis not present

## 2023-11-26 DIAGNOSIS — Z79899 Other long term (current) drug therapy: Secondary | ICD-10-CM | POA: Diagnosis not present

## 2023-11-26 DIAGNOSIS — Z885 Allergy status to narcotic agent status: Secondary | ICD-10-CM | POA: Diagnosis not present

## 2023-11-26 DIAGNOSIS — M069 Rheumatoid arthritis, unspecified: Secondary | ICD-10-CM | POA: Diagnosis not present

## 2023-11-26 DIAGNOSIS — J309 Allergic rhinitis, unspecified: Secondary | ICD-10-CM | POA: Diagnosis not present

## 2023-11-26 DIAGNOSIS — Z923 Personal history of irradiation: Secondary | ICD-10-CM | POA: Diagnosis not present

## 2023-11-26 DIAGNOSIS — Z8619 Personal history of other infectious and parasitic diseases: Secondary | ICD-10-CM | POA: Diagnosis not present

## 2023-11-26 DIAGNOSIS — Z79620 Long term (current) use of immunosuppressive biologic: Secondary | ICD-10-CM | POA: Diagnosis not present

## 2023-11-26 DIAGNOSIS — R590 Localized enlarged lymph nodes: Secondary | ICD-10-CM | POA: Diagnosis not present

## 2023-11-26 MED ORDER — LIDOCAINE-PRILOCAINE 2.5-2.5 % EX CREA: TOPICAL_CREAM | CUTANEOUS | 3 refills | Status: AC

## 2023-11-26 MED ORDER — DEXAMETHASONE 4 MG PO TABS: ORAL_TABLET | ORAL | 1 refills | Status: AC

## 2023-11-26 MED ORDER — PROCHLORPERAZINE MALEATE 10 MG PO TABS: 10.0000 mg | ORAL_TABLET | Freq: Four times a day (QID) | ORAL | 1 refills | Status: AC | PRN

## 2023-11-26 MED ORDER — CYANOCOBALAMIN 1000 MCG/ML IJ SOLN
1000.0000 ug | Freq: Once | INTRAMUSCULAR | Status: AC
  Administered 2023-11-26: 1000 ug via INTRAMUSCULAR
  Filled 2023-11-26: qty 1

## 2023-11-26 MED ORDER — FOLIC ACID 1 MG PO TABS: 1.0000 mg | ORAL_TABLET | Freq: Every day | ORAL | 3 refills | Status: AC

## 2023-11-26 MED ORDER — ONDANSETRON HCL 8 MG PO TABS: 8.0000 mg | ORAL_TABLET | Freq: Three times a day (TID) | ORAL | 1 refills | Status: AC | PRN

## 2023-11-26 NOTE — Progress Notes (Signed)
 Pt given IM b-12 injection (see MAR) during oncology appt per MD orders. Tolerated well, no complaints.

## 2023-11-26 NOTE — Progress Notes (Signed)
 error

## 2023-11-26 NOTE — Progress Notes (Signed)
 [facility] [phone]   [phone]  OFFICE PROGRESS NOTE  [name] LABOR, MD [address] [place] [address]  DIAGNOSIS:   1) metastatic non-small cell lung cancer, adenocarcinoma initially diagnosed as stage IIIa (T1c, N2, M0) non-small cell lung cancer, adenocarcinoma presented with right lower lobe lung nodule in addition to right paratracheal lymphadenopathy diagnosed in February 2025.  The patient has evidence for disease progression with bilateral pulmonary nodules in addition to bone metastasis in September 2025.  Molecular studies by [email or web address] showed no actionable mutations and negative PD-L1 expression.   2) history of rheumatoid arthritis and currently on treatment with Plaquenil .  PRIOR THERAPY:  1) A course of concurrent chemoradiation with weekly carboplatin  for AUC of 2 and paclitaxel  45 Mg/M2.  First dose May 04, 2023.  Status post 7 cycles.  Last dose was given on 06/15/2023. 2) Consolidation treatment with immunotherapy with Imfinzi  1500 Mg IV every 4 weeks status post 5 cycles.  Last dose was given 11/10/2023 discontinued secondary to disease progression.  CURRENT THERAPY: Systemic chemotherapy with carboplatin  for AUC of 5, Alimta 500 mg/M2 and Avastin 15 MGs/KG every 3 weeks.  First dose 12/03/2023.  INTERVAL HISTORY: Wendy [name] 78 y.o. female returns to the clinic today for follow-up visit accompanied by her nephew. Discussed the use of AI scribe software for clinical note transcription with the patient, who gave verbal consent to proceed.  History of Present Illness Wendy [name] is a 78 year old female with metastatic non-small cell lung cancer who presents for evaluation and repeat PET scan for restaging of her disease. She is accompanied by her nephew, [place].  Initially diagnosed with stage III non-small cell lung cancer, adenocarcinoma, in February 2025, her disease progressed to stage IV by  September 2025 with evidence of bilateral pulmonary nodules and bone metastasis. She underwent concurrent chemoradiation with weekly carboplatin  and paclitaxel , followed by five cycles of durvalumab  1500 mg IV every four weeks, with the last dose on November 10, 2023.  She experiences pain around her side, initially thought to be due to her mattress. After acquiring an adjustable bed, she noted improvement in the pain. No chest pain, breathing issues, or cough. Additionally, no nausea, vomiting, diarrhea, or rash from the immunotherapy.  A recent MRI of her back revealed a suspicious spot at the T8 thoracic vertebrae. A subsequent PET scan confirmed a spot in the bone and small nodules in the lung.   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 Jon Jacob     ALLERGIES:  is allergic to aspirin and tramadol.  MEDICATIONS:  Current Outpatient Medications  Medication Sig Dispense Refill  Accu-Chek Softclix Lancets lancets Test once per day and diagnosis code is E 11.9 and dispense for (ACCU-CHEK Soft Touch) 100 each 12   ALPRAZolam  (XANAX ) 1 MG tablet TAKE 1 TABLET (1 MG TOTAL) BY MOUTH AT BEDTIME AS NEEDED FOR ANXIETY OR SLEEP. 30 tablet 5   amLODipine  (NORVASC ) 10 MG tablet Take 1 tablet (10 mg total) by mouth daily. 90 tablet 3   atorvastatin  (LIPITOR) 10 MG tablet TAKE 1  TABLET BY MOUTH EVERY DAY 90 tablet 3   Blood Glucose Monitoring Suppl (ACCU-CHEK GUIDE) w/Device KIT Use to test sugars daily. 1 kit 0   Blood Glucose Monitoring Suppl DEVI 1 each by Does not apply route in the morning, at noon, and at bedtime. May substitute to any manufacturer covered by patient's insurance. 1 each 0   Blood Pressure Monitor KIT Use to check blood pressure. 1 kit 0   Cholecalciferol (VITAMIN D3) 20 MCG (800 UNIT) TABS Take 800 Units by mouth daily.     DULoxetine  (CYMBALTA ) 30 MG capsule Take 1 capsule (30 mg total) by mouth at bedtime.     fluticasone  (FLONASE ) 50 MCG/ACT nasal spray SPRAY 2 SPRAYS INTO EACH NOSTRIL EVERY DAY 48 mL 3   furosemide  (LASIX ) 20 MG tablet Take 1 tablet (20 mg total) by mouth daily as needed (fluid retention). 90 tablet 3   Ginkgo Biloba (GNP GINGKO BILOBA EXTRACT PO) Take by mouth. (Patient not taking: Reported on 11/12/2023)     glipiZIDE  (GLUCOTROL ) 5 MG tablet Take 1 tablet (5 mg total) by mouth 2 (two) times daily before a meal. 180 tablet 3   glucose blood (ACCU-CHEK GUIDE TEST) test strip Use as instructed 100 strip 1   HYDROcodone -acetaminophen  (NORCO) 10-325 MG tablet Take 1 tablet by mouth every 6 (six) hours as needed for moderate pain (pain score 4-6). (Patient not taking: Reported on 11/12/2023) 120 tablet 0   HYDROcodone -acetaminophen  (NORCO) 10-325 MG tablet Take 1 tablet by mouth every 6 (six) hours as needed for moderate pain (pain score 4-6). (Patient not taking: Reported on 11/12/2023) 120 tablet 0   HYDROcodone -acetaminophen  (NORCO) 10-325 MG tablet Take 1 tablet by mouth every 6 (six) hours as needed for moderate pain (pain score 4-6). 120 tablet 0   hydroxychloroquine  (PLAQUENIL ) 200 MG tablet Take 1 tablet (200 mg total) by mouth daily. 90 tablet 1   insulin  degludec (TRESIBA  FLEXTOUCH) 100 UNIT/ML FlexTouch Pen Inject 20 Units into the skin daily. 15 mL 4   Insulin  Pen Needle 32G X 4 MM MISC Use 4x a day 300 each 3   Lancets  (ACCU-CHEK SOFT TOUCH) lancets Test once per day and diagnosis code is E 11.9 and dispense for Aviva plus 100 each 3   levocetirizine (XYZAL ) 5 MG tablet TAKE 1 TABLET BY MOUTH EVERY DAY IN THE EVENING (Patient not taking: Reported on 11/12/2023) 90 tablet 3   levocetirizine (XYZAL ) 5 MG tablet Take 1 tablet (5 mg total) by mouth every evening. 90 tablet 3   metoCLOPramide  (REGLAN ) 10 MG tablet Take 1 tablet (10 mg total) by mouth 3 (three) times daily with meals for 5 days. (Patient not taking: Reported on 11/12/2023) 15 tablet 0   mupirocin  ointment (BACTROBAN ) 2 % Apply 1 Application topically 2 (two) times daily. 22 g 5   omeprazole  (PRILOSEC) 40 MG capsule TAKE 1 CAPSULE BY MOUTH EVERY DAY 90 capsule 3   ondansetron  (ZOFRAN ) 4 MG tablet Take 1 tablet (4 mg total) by mouth every 8 (eight) hours as  needed for nausea or vomiting. 90 tablet 3   polyethylene glycol (MIRALAX  / GLYCOLAX ) 17 g packet Take 17 g by mouth daily as needed for mild constipation.     potassium chloride  (KLOR-CON ) 10 MEQ tablet TAKE 1 TABLET BY MOUTH EVERY DAY 90 tablet 3   pregabalin  (LYRICA ) 100 MG capsule Take 1 capsule (100 mg total) by mouth at bedtime. 90 capsule 1   RESTASIS  0.05 % ophthalmic emulsion Place 1 drop into both eyes 2 (two) times daily.     sitaGLIPtin  (JANUVIA ) 100 MG tablet Take 1 tablet (100 mg total) by mouth at bedtime. 90 tablet 3   sucralfate  (CARAFATE ) 1 g tablet Take 1 tablet (1 g total) by mouth 4 (four) times daily. Dissolve each tablet in 15 cc water before use. (Patient not taking: Reported on 11/12/2023) 120 tablet 2   No current facility-administered medications for this visit.    SURGICAL HISTORY:  Past Surgical History:  Procedure Laterality Date   BRONCHIAL BRUSHINGS  04/07/2023   Procedure: BRONCHIAL BRUSHINGS;  Surgeon: Shelah Lamar RAMAN, MD;  Location[facility] ENDOSCOPY;  Service: Pulmonary;;   BRONCHIAL NEEDLE ASPIRATION BIOPSY  04/07/2023   Procedure: BRONCHIAL NEEDLE ASPIRATION BIOPSIES;   Surgeon: Shelah Lamar RAMAN, MD;  Location: [facility] ENDOSCOPY;  Service: Pulmonary;;   CATARACT EXTRACTION     COLONOSCOPY  11/23/2019   per Dr. Teressa, adenmatous polyp, repeat in 7 yrs   ESOPHAGOGASTRODUODENOSCOPY  06/20/2008   at [facility], clear    FINE NEEDLE ASPIRATION  04/07/2023   Procedure: FINE NEEDLE ASPIRATION (FNA) LINEAR;  Surgeon: Shelah Lamar RAMAN, MD;  Location: [facility] ENDOSCOPY;  Service: Pulmonary;;   IR IMAGING GUIDED PORT INSERTION  04/30/2023   LUMBAR LAMINECTOMY     1995   ROTATOR CUFF REPAIR Right    SPINAL FUSION N/A 07/29/2022   Procedure: T10-PELVIS INSTRUMENTATION/ T10-L4 POSTERIOR SPINAL FUSION;  Surgeon: Georgina Ozell LABOR, MD;  Location: [facility] OR;  Service: Orthopedics;  Laterality: N/A;   UPPER GASTROINTESTINAL ENDOSCOPY  2010   [facility]    VIDEO BRONCHOSCOPY WITH ENDOBRONCHIAL ULTRASOUND N/A 04/07/2023   Procedure: VIDEO BRONCHOSCOPY WITH ENDOBRONCHIAL ULTRASOUND;  Surgeon: Shelah Lamar RAMAN, MD;  Location: [facility] ENDOSCOPY;  Service: Pulmonary;  Laterality: N/A;    REVIEW OF SYSTEMS:  Constitutional: positive for fatigue Eyes: negative Ears, nose, mouth, throat, and face: negative Respiratory: positive for dyspnea on exertion Cardiovascular: negative Gastrointestinal: negative Genitourinary:negative Integument/breast: negative Hematologic/lymphatic: negative Musculoskeletal:positive for back pain Neurological: negative Behavioral/Psych: negative Endocrine: negative Allergic/Immunologic: negative   PHYSICAL EXAMINATION: General appearance: alert, cooperative, fatigued, and no distress Head: Normocephalic, without obvious abnormality, atraumatic Neck: no adenopathy, no JVD, supple, symmetrical, trachea midline, and thyroid  not enlarged, symmetric, no tenderness/mass/nodules Lymph nodes: Cervical, supraclavicular, and axillary nodes normal. Resp: clear to auscultation bilaterally Back: symmetric, no curvature. ROM normal. No CVA tenderness. Cardio: regular rate and  rhythm, S1, S2 normal, no murmur, click, rub or gallop GI: soft, non-tender; bowel sounds normal; no masses,  no organomegaly Extremities: extremities normal, atraumatic, no cyanosis or edema Neurologic: Alert and oriented X 3, normal strength and tone. Normal symmetric reflexes. Normal coordination and gait  ECOG PERFORMANCE STATUS: 1 - Symptomatic but completely ambulatory  Blood pressure 136/81, pulse 91, temperature 98 F (36.7 C), temperature source Temporal, resp. rate 17, height 5' 3 (1.6 m), weight 183 lb (83 kg), SpO2 98%.  LABORATORY DATA: Lab Results  Component Value Date   WBC 4.7 11/10/2023   HGB 11.4 (L) 11/10/2023   HCT 36.1 11/10/2023  MCV 76.6 (L) 11/10/2023   PLT 220 11/10/2023      Chemistry      Component Value Date/Time   NA 140 11/10/2023 1106   K 4.0 11/10/2023 1106   CL 106 11/10/2023 1106   CO2 29 11/10/2023 1106   BUN 18 11/10/2023 1106   CREATININE 0.96 11/10/2023 1106   CREATININE 1.07 (H) 09/16/2019 1511      Component Value Date/Time   CALCIUM  9.2 11/10/2023 1106   ALKPHOS 86 11/10/2023 1106   AST 12 (L) 11/10/2023 1106   ALT 10 11/10/2023 1106   BILITOT 0.7 11/10/2023 1106       RADIOGRAPHIC STUDIES: NM PET Image Restag (PS) Skull Base To Thigh Addendum Date: 11/24/2023 ADDENDUM REPORT: 11/24/2023 11:07 TECHNIQUE: 8.5 mCi F-18 FDG was injected intravenously. Full-ring PET imaging was performed from the skull base to thigh after the radiotracer. CT data was obtained and used for attenuation correction and anatomic localization. Fasting blood glucose: 129 mg/dl Electronically Signed   By: Megan  Zare M.D.   On: 11/24/2023 11:07   Result Date: 11/24/2023 CLINICAL DATA:  Subsequent treatment strategy for right lower lobe non-small cell lung cancer. EXAM: NUCLEAR MEDICINE PET SKULL BASE TO THIGH TECHNIQUE: mCi F-18 FDG was injected intravenously. Full-ring PET imaging was performed from the skull base to thigh after the radiotracer. CT data was  obtained and used for attenuation correction and anatomic localization. Fasting blood glucose:  mg/dl COMPARISON:  PET-CT February 05, 2023, chest CT October 08, 2023 FINDINGS: Mediastinal blood pool activity: SUV max 4.4 Liver activity: SUV max 4.2 NECK: No suspicious lymphadenopathy. Incidental CT findings: None. CHEST: Postradiation changes of the right lower lobe malignancy with expected FDG uptake and fibrotic changes along the perihilar right lower lobe and right upper lobe with air bronchograms max SUV 9.5. Primary malignancy is obscured by overlying postradiation fibrosis measures approximately 1.1 cm with max SUV 9.5. There is a more intense nodular FDG avid ground-glass component along the lateral border of the postradiation changes max SUV 11.1 measuring approximately 2.7 x 2.7 cm cm (7/21). This finding is new to prior CT and PET-CT. There is a new left lower lobe contralateral nodule measuring 1.2 cm with max SUV 5.9 concerning for metastasis. (7/29). Smaller nodule measuring 5 mm lateral to this nodule and below PET resolution (7/29). Previously seen micronodule along the left upper lobe medial aspect is stable and below PET resolution (7/12). Increased right-sided pleural effusion now small versus previously trace. New trace left pleural effusion. Incidental CT findings: Porta catheter tip terminates in cavoatrial junction. Atherosclerotic calcifications of coronary arteries. The heart size is normal. No pericardial fluid. Patulous esophagus. ABDOMEN/PELVIS: No suspicious lesion to suggest metastatic disease. Colonic diverticulosis without diverticulitis. Uterine fibroids with coarse calcifications, stable and non FDG avid. No adnexal mass. Incidental CT findings: None. SKELETON: FDG avid lytic lesion involving T8 vertebral body new to prior and similar to recent CT max SUV 19.4 consistent with metastasis. Incidental CT findings: Posterior spinal fusion device. IMPRESSION: Right lower lobe malignancy  with expected postradiation changes along the right parahilar with mild FDG uptake. New nodular FDG avid lesions in bilateral lower lobes as detailed above most consistent with metastasis. Interval increase in pleural effusion. These findings are progressed to prior PET-CT and recent chest CT. 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 Electronically Signed: By: Megan  Zare M.D. On: 11/23/2023 19:28      ASSESSMENT  AND PLAN: This is a very pleasant 78 years old African-American female with Stage IIIa (T1c, N2, M0) non-small cell lung cancer, adenocarcinoma presented with right lower lobe lung nodule in addition to right paratracheal lymphadenopathy diagnosed in February 2025. Molecular studies by [id number] showed no actionable mutations and negative PD-L1 expression. She also has a history of rheumatoid arthritis and currently on treatment with Plaquenil . She underwent a course of concurrent chemoradiation with weekly carboplatin  for AUC of 2 and paclitaxel  45 Mg/M2.  First dose May 04, 2023.  States post 7 cycles.  Last cycle was given on 06/15/2023. She is currently undergoing consolidation treatment with immunotherapy with Imfinzi  1500 Mg IV every 4 weeks status post 3 cycles. She had repeat CT scan of the chest performed recently. Her scan showed improvement in the right lower lobe lung mass but there was a lytic lesion at the T8 vertebrae.  She had a PET scan performed recently.  I personally and independently reviewed the PET scan images and discussed the result and showed the images to the patient today.  Her PET scan showed the right lower lobe malignancy with the expected postradiation changes and mild FDG uptake but there was new nodular FDG avid lesions in bilateral lower lobes consistent with metastasis as well as interval increase in pleural effusion.  There was also intense FDG avid lytic osseous metastasis involving  T8 vertebral body. Assessment and Plan Assessment & Plan Metastatic lung adenocarcinoma with bone and pulmonary metastases Metastatic lung adenocarcinoma initially diagnosed as stage III in February 2025, now progressed to stage IV with bilateral pulmonary nodules and bone metastasis. Recent PET scan confirmed suspicious nodules in the lungs and a spot in the T8 vertebra, indicating cancer spread. Current immunotherapy with durvalumab  is not controlling the disease progression. - Switch to chemotherapy with carboplatin , pemetrexed (Alimta), and bevacizumab (Avastin) to manage the disease. -The patient understands that this treatment is palliative in nature with no cure for her condition and the goal of treatment will be prolongation of her survival and palliation of her symptoms. - Administer chemotherapy every three weeks. - Administer vitamin B12 injection today. - Prescribe dexamethasone  4 mg tablets to be taken one in the morning and one in the evening the day before chemotherapy, on the day of chemotherapy, and the day after chemotherapy. - Send prescriptions to pharmacy.  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 Pain likely due to cancer spread rather than mattress issues. - Refer to radiation oncologist for radiation therapy to T8 vertebra. The patient voices understanding of current disease status and treatment options and is in agreement with the current care plan.  All questions were answered. The patient knows to call the clinic with any problems, questions or concerns. We can certainly see the patient much sooner if necessary.  The total time spent in the appointment was 55 minutes.  Disclaimer: This note was dictated with voice recognition software. Similar sounding words can inadvertently be transcribed and may not be corrected upon review.

## 2023-11-26 NOTE — Telephone Encounter (Signed)
 Dental work for [employer].  Pt stated that Dr. Janell would not fix her cavity and referred her to [facility].   I LVM for Dr. Janell office to return my call and to see if he made a referral to [facility]. Dr Sherrod wants her to get her dental work before starting chemotherapy.  ILVM at [facility] to return my call on Monday re  dental work she needs before she starts chemotherapy and because Dr Janell will not be able to fix her cavity.

## 2023-11-26 NOTE — Progress Notes (Signed)
 DISCONTINUE ON PATHWAY REGIMEN - Non-Small Cell Lung     A cycle is every 28 days:     Durvalumab    **Always confirm dose/schedule in your pharmacy ordering system**  PRIOR TREATMENT: [id number]: Durvalumab  1,500 mg q28 Days x up to 12 Months  START ON PATHWAY REGIMEN - Non-Small Cell Lung     Cycles 1 through up to 6: A cycle is every 21 days:     Bevacizumab-xxxx      Pemetrexed      Carboplatin    **Always confirm dose/schedule in your pharmacy ordering system**  Patient Characteristics: Stage IV Metastatic, Nonsquamous, Molecular Analysis Completed, Molecular Alteration Present and Targeted Therapy Exhausted OR KRAS G12C+ or HER2+ or NRG1+ or c-Met Present and No Prior Chemo/Immunotherapy OR No Alteration Present, Initial  Chemotherapy/Immunotherapy, PS = 0, 1, No Alteration Present, No Alteration Present, Not a Candidate for Immunotherapy Therapeutic Status: Stage IV Metastatic Histology: Nonsquamous Cell Broad Molecular Profiling Status: Animal nutritionist Analysis Results: No Alteration Present ECOG Performance Status: 1 Chemotherapy/Immunotherapy Line of Therapy: Initial Chemotherapy/Immunotherapy EGFR Exons 18-21 Mutation Testing Status: Completed and Negative c-Met Overexpression (EGFR Wildtype) Testing Status: Completed and Negative ALK Fusion/Rearrangement Testing Status: Completed and Negative BRAF V600 Mutation Testing Status: Completed and Negative KRAS G12C Mutation Testing Status: Completed and Negative MET Exon 14 Mutation Testing Status: Completed and Negative RET Fusion/Rearrangement Testing Status: Completed and Negative NRG1 Fusion/Rearrangement Testing Status: Completed and Negative HER2 Mutation Testing Status: Completed and Negative NTRK Fusion/Rearrangement Testing Status: Completed and Negative ROS1 Fusion/Rearrangement Testing Status: Completed and Negative Immunotherapy Candidate Status: Not a Candidate for Immunotherapy Intent of  Therapy: Non-Curative / Palliative Intent, Discussed with Patient

## 2023-11-27 ENCOUNTER — Telehealth: Payer: Self-pay | Admitting: Radiation Oncology

## 2023-11-27 NOTE — Telephone Encounter (Signed)
 Pt returned call to schedule consult. Pt agreeable to appts offered but advised us  to call her daughter to review accepted appts since she is a little confused. I LVM for pt's daughter to c/b and review appts.

## 2023-11-27 NOTE — Telephone Encounter (Signed)
 LVM for pt to schedule consult with Radiation Oncology

## 2023-12-01 NOTE — Progress Notes (Signed)
 Location of tumor and Histology per Pathology Report:   Biopsy: ***  Past/Anticipated interventions by surgeon, if any: {t:21944} ***  Past/Anticipated interventions by medical oncology, if any: Chemotherapy ***    Pain issues, if any:  {:18581} {PAIN DESCRIPTION:21022940}  SAFETY ISSUES: Prior radiation? {:18581} Pacemaker/ICD? {:18581} Possible current pregnancy? no Is the patient on methotrexate? {:18581}  Current Complaints / other details:  ***     ***

## 2023-12-02 ENCOUNTER — Ambulatory Visit
Admission: RE | Admit: 2023-12-02 | Discharge: 2023-12-02 | Disposition: A | Discharge status: Home / Self Care | Source: Ambulatory Visit | Attending: Radiation Oncology | Admitting: Radiation Oncology

## 2023-12-02 ENCOUNTER — Telehealth: Payer: Self-pay

## 2023-12-02 ENCOUNTER — Encounter: Payer: Self-pay | Admitting: Radiation Oncology

## 2023-12-02 ENCOUNTER — Encounter: Payer: Self-pay | Admitting: Licensed Clinical Social Worker

## 2023-12-02 VITALS — BP 138/79 | HR 93 | Temp 97.9°F | Resp 20 | Ht 63.0 in | Wt 181.6 lb

## 2023-12-02 DIAGNOSIS — Z5111 Encounter for antineoplastic chemotherapy: Secondary | ICD-10-CM | POA: Diagnosis not present

## 2023-12-02 DIAGNOSIS — Z87891 Personal history of nicotine dependence: Secondary | ICD-10-CM | POA: Diagnosis not present

## 2023-12-02 DIAGNOSIS — Z510 Encounter for antineoplastic radiation therapy: Secondary | ICD-10-CM | POA: Insufficient documentation

## 2023-12-02 DIAGNOSIS — C7951 Secondary malignant neoplasm of bone: Secondary | ICD-10-CM | POA: Insufficient documentation

## 2023-12-02 DIAGNOSIS — Z923 Personal history of irradiation: Secondary | ICD-10-CM | POA: Diagnosis not present

## 2023-12-02 DIAGNOSIS — C3431 Malignant neoplasm of lower lobe, right bronchus or lung: Secondary | ICD-10-CM

## 2023-12-02 NOTE — Progress Notes (Signed)
 Radiation Oncology         (336) [phone] ________________________________   Outpatient Re-Consultation Note  Name: Wendy [name] MRN: [id number]  Date: 12/02/2023  DOB: Feb 08, 1946  CC:Fry, Garnette LABOR, MD  Sherrod Sherrod, MD   REFERRING PHYSICIAN: Sherrod Sherrod, MD  DIAGNOSIS: The primary encounter diagnosis was Metastasis to bone [facility]). 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  Metastatic non-small cell lung cancer, adenocarcinoma initially diagnosed as stage IIIa (T1c, N2, M0) non-small cell lung cancer with evidence for disease progression with bilateral pulmonary nodules in addition to bone metastasis in September 2025   HISTORY OF PRESENT ILLNESS::Wendy [name] is a 78 y.o. female who is seen as a courtesy of Dr. Sherrod for an opinion concerning radiation therapy as part of management for her metastatic non-small cell lung cancer initially diagnosed in February of 2025. She is known to me for her history of radiation for lung cancer and was last seen in office on 04/21/23 for a consultation visit.   In the interval since she was last seen, her disease has progressed to  IV by September 2025 with evidence of bilateral pulmonary nodules and bone metastasis. She underwent concurrent chemoradiation with weekly carboplatin  and paclitaxel , followed by five cycles of durvalumab  1500 mg IV every four weeks, with the last dose on 11/10/2023.   A recent MRI of her back revealed a suspicious area at the T8 thoracic vertebrae. Most recent PET scan performed on 11/20/23 indicated Intense FDG avid lytic osseous metastasis involving T8 vertebral body. Scan also noted a  new left lower lobe contralateral nodule measuring 1.2 cm with max SUV 5.9 concerning for metastasis and a smaller nodule measuring 5 mm lateral to this nodule. Scan redemonstrated right lower lobe malignancy with expected postradiation changes along the right parahilar  with mild FDG uptake.  She presented for a follow up with Dr. Sherrod on 11/26/23 where he opted to proceed with switching to chemotherapy with carboplatin , pemetrexed (Alimta), and bevacizumab (Avastin) to manage the disease as previous regimen did not control her malignancy. To manage her pain and new bone metastasis, he recommended radiation therapy to T8 vertebra.        PREVIOUS RADIATION THERAPY: Yes  First Treatment Date: 2023-05-04 Last Treatment Date: 2023-06-17   Plan Name: Lung_R Site: Lung, Right Technique: 3D Mode: Photon Dose Per Fraction: 2 Gy Prescribed Dose (Delivered / Prescribed): 60 Gy / 60 Gy Prescribed Fxs (Delivered / Prescribed): 30 / 30   Plan Name: Lung_R_Bst Site: Lung, Right Technique: 3D Mode: Photon Dose Per Fraction: 2 Gy Prescribed Dose (Delivered / Prescribed): 6 Gy / 6 Gy Prescribed Fxs (Delivered / Prescribed): 3 / 3   PAST MEDICAL HISTORY:  Past Medical History:  Diagnosis Date   Allergy    year around   Anemia    long time ago    Anxiety    Arthritis    RA    Cataract    removed  both dr effie-    Complication of anesthesia    DEPENDENT EDEMA 07/21/2007   DIABETES MELLITUS, TYPE II 11/05/2006   FOOT PAIN 05/23/2009   GERD 11/20/2006   Hepatitis    HEPATITIS C 02/08/2007   sees GI @ [facility]   Hyperlipidemia    HYPERTENSION 11/05/2006   LOW BACK PAIN SYNDROME 07/21/2007   Neuromuscular disorder (HCC)    neuropathy legs, feet    PONV (postoperative nausea and vomiting)  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 Jon Jacob     PAST SURGICAL HISTORY: Past Surgical History:  Procedure Laterality Date   BRONCHIAL BRUSHINGS  04/07/2023   Procedure: BRONCHIAL BRUSHINGS;  Surgeon: Shelah Lamar RAMAN, MD;  Location: [facility] ENDOSCOPY;  Service: Pulmonary;;   BRONCHIAL NEEDLE ASPIRATION BIOPSY  04/07/2023   Procedure: BRONCHIAL NEEDLE ASPIRATION BIOPSIES;  Surgeon: Shelah Lamar RAMAN, MD;   Location: [facility] ENDOSCOPY;  Service: Pulmonary;;   CATARACT EXTRACTION     COLONOSCOPY  11/23/2019   per Dr. Teressa, adenmatous polyp, repeat in 7 yrs   ESOPHAGOGASTRODUODENOSCOPY  06/20/2008   at [facility], clear    FINE NEEDLE ASPIRATION  04/07/2023   Procedure: FINE NEEDLE ASPIRATION (FNA) LINEAR;  Surgeon: Shelah Lamar RAMAN, MD;  Location: [facility] ENDOSCOPY;  Service: Pulmonary;;   IR IMAGING GUIDED PORT INSERTION  04/30/2023   LUMBAR LAMINECTOMY     1995   ROTATOR CUFF REPAIR Right    SPINAL FUSION N/A 07/29/2022   Procedure: T10-PELVIS INSTRUMENTATION/ T10-L4 POSTERIOR SPINAL FUSION;  Surgeon: Georgina Ozell LABOR, MD;  Location: [facility] OR;  Service: Orthopedics;  Laterality: N/A;   UPPER GASTROINTESTINAL ENDOSCOPY  2010   [facility]    VIDEO BRONCHOSCOPY WITH ENDOBRONCHIAL ULTRASOUND N/A 04/07/2023   Procedure: VIDEO BRONCHOSCOPY WITH ENDOBRONCHIAL ULTRASOUND;  Surgeon: Shelah Lamar RAMAN, MD;  Location: [facility] ENDOSCOPY;  Service: Pulmonary;  Laterality: N/A;    FAMILY HISTORY:  Family History  Problem Relation Age of Onset   Lung cancer Mother    Heart Problems Father    Pancreatic cancer Sister    Lung cancer Sister    Congestive Heart Failure Sister    Cancer Brother    Diabetes Brother    Other Brother        dehydration   Arthritis Other    Diabetes Other    Hypertension Other    Cancer Other        lung   Healthy Son    Breast cancer Daughter    Colon cancer Neg Hx    Colon polyps Neg Hx    Esophageal cancer Neg Hx    Rectal cancer Neg Hx    Stomach cancer Neg Hx     SOCIAL HISTORY:  Social History   Tobacco Use   Smoking status: Former    Current packs/day: 0.00    Average packs/day: (1.4 ttl pk-yrs)    Types: Cigarettes, Cigars    Start date: 04/19/1977    Quit date: 04/19/2022    Years since quitting: 1.6    Passive exposure: Current   Smokeless tobacco: Never   Tobacco comments:    1 black and mild every 2-3 days- no cigs - doesnt smoke the whole thing (quit smoking  cigarettes around 2019)  Vaping Use   Vaping status: Never Used  Substance Use Topics   Alcohol use: Not Currently   Drug use: Not Currently    ALLERGIES:  Allergies  Allergen Reactions   Aspirin Nausea And Vomiting   Tramadol Nausea And Vomiting    MEDICATIONS:  Current Outpatient Medications  Medication Sig Dispense Refill   Accu-Chek Softclix Lancets lancets Test once per day and diagnosis code is E 11.9 and dispense for (ACCU-CHEK Soft Touch) 100 each 12   ALPRAZolam  (XANAX ) 1 MG tablet TAKE 1 TABLET (1 MG TOTAL) BY MOUTH AT BEDTIME AS NEEDED FOR ANXIETY OR SLEEP. 30 tablet 5   amLODipine  (NORVASC ) 10 MG tablet Take  1 tablet (10 mg total) by mouth daily. 90 tablet 3   atorvastatin  (LIPITOR) 10 MG tablet TAKE 1 TABLET BY MOUTH EVERY DAY 90 tablet 3   Blood Glucose Monitoring Suppl (ACCU-CHEK GUIDE) w/Device KIT Use to test sugars daily. 1 kit 0   Blood Glucose Monitoring Suppl DEVI 1 each by Does not apply route in the morning, at noon, and at bedtime. May substitute to any manufacturer covered by patient's insurance. 1 each 0   Blood Pressure Monitor KIT Use to check blood pressure. 1 kit 0   Cholecalciferol (VITAMIN D3) 20 MCG (800 UNIT) TABS Take 800 Units by mouth daily.     dexamethasone  (DECADRON ) 4 MG tablet Take 1 tab by mouth 2 times daily starting day before pemetrexed. Then take 2 tabs daily x 3 days starting day after chemo. Take with food. 30 tablet 1   DULoxetine  (CYMBALTA ) 30 MG capsule Take 1 capsule (30 mg total) by mouth at bedtime.     fluticasone  (FLONASE ) 50 MCG/ACT nasal spray SPRAY 2 SPRAYS INTO EACH NOSTRIL EVERY DAY 48 mL 3   folic acid  (FOLVITE ) 1 MG tablet Take 1 tablet (1 mg total) by mouth daily. Start 7 days before pemetrexed chemotherapy. Continue until 21 days after pemetrexed completed. 100 tablet 3   furosemide  (LASIX ) 20 MG tablet Take 1 tablet (20 mg total) by mouth daily as needed (fluid retention). 90 tablet 3   Ginkgo Biloba (GNP GINGKO BILOBA  EXTRACT PO) Take by mouth. (Patient not taking: Reported on 11/12/2023)     glipiZIDE  (GLUCOTROL ) 5 MG tablet Take 1 tablet (5 mg total) by mouth 2 (two) times daily before a meal. 180 tablet 3   glucose blood (ACCU-CHEK GUIDE TEST) test strip Use as instructed 100 strip 1   HYDROcodone -acetaminophen  (NORCO) 10-325 MG tablet Take 1 tablet by mouth every 6 (six) hours as needed for moderate pain (pain score 4-6). (Patient not taking: Reported on 11/12/2023) 120 tablet 0   HYDROcodone -acetaminophen  (NORCO) 10-325 MG tablet Take 1 tablet by mouth every 6 (six) hours as needed for moderate pain (pain score 4-6). (Patient not taking: Reported on 11/12/2023) 120 tablet 0   HYDROcodone -acetaminophen  (NORCO) 10-325 MG tablet Take 1 tablet by mouth every 6 (six) hours as needed for moderate pain (pain score 4-6). 120 tablet 0   hydroxychloroquine  (PLAQUENIL ) 200 MG tablet Take 1 tablet (200 mg total) by mouth daily. 90 tablet 1   insulin  degludec (TRESIBA  FLEXTOUCH) 100 UNIT/ML FlexTouch Pen Inject 20 Units into the skin daily. 15 mL 4   Insulin  Pen Needle 32G X 4 MM MISC Use 4x a day 300 each 3   Lancets (ACCU-CHEK SOFT TOUCH) lancets Test once per day and diagnosis code is E 11.9 and dispense for Aviva plus 100 each 3   levocetirizine (XYZAL ) 5 MG tablet TAKE 1 TABLET BY MOUTH EVERY DAY IN THE EVENING (Patient not taking: Reported on 11/12/2023) 90 tablet 3   levocetirizine (XYZAL ) 5 MG tablet Take 1 tablet (5 mg total) by mouth every evening. 90 tablet 3   lidocaine -prilocaine  (EMLA ) cream Apply to affected area once 30 g 3   metoCLOPramide  (REGLAN ) 10 MG tablet Take 1 tablet (10 mg total) by mouth 3 (three) times daily with meals for 5 days. (Patient not taking: Reported on 11/12/2023) 15 tablet 0   mupirocin  ointment (BACTROBAN ) 2 % Apply 1 Application topically 2 (two) times daily. 22 g 5   omeprazole  (PRILOSEC) 40 MG capsule TAKE 1 CAPSULE BY MOUTH  EVERY DAY 90 capsule 3   ondansetron  (ZOFRAN ) 4 MG tablet  Take 1 tablet (4 mg total) by mouth every 8 (eight) hours as needed for nausea or vomiting. 90 tablet 3   ondansetron  (ZOFRAN ) 8 MG tablet Take 1 tablet (8 mg total) by mouth every 8 (eight) hours as needed for nausea or vomiting. Start on the third day after chemotherapy. 30 tablet 1   polyethylene glycol (MIRALAX  / GLYCOLAX ) 17 g packet Take 17 g by mouth daily as needed for mild constipation.     potassium chloride  (KLOR-CON ) 10 MEQ tablet TAKE 1 TABLET BY MOUTH EVERY DAY 90 tablet 3   pregabalin  (LYRICA ) 100 MG capsule Take 1 capsule (100 mg total) by mouth at bedtime. 90 capsule 1   prochlorperazine  (COMPAZINE ) 10 MG tablet Take 1 tablet (10 mg total) by mouth every 6 (six) hours as needed for nausea or vomiting. 30 tablet 1   RESTASIS  0.05 % ophthalmic emulsion Place 1 drop into both eyes 2 (two) times daily.     sitaGLIPtin  (JANUVIA ) 100 MG tablet Take 1 tablet (100 mg total) by mouth at bedtime. 90 tablet 3   sucralfate  (CARAFATE ) 1 g tablet Take 1 tablet (1 g total) by mouth 4 (four) times daily. Dissolve each tablet in 15 cc water before use. (Patient not taking: Reported on 11/12/2023) 120 tablet 2   No current facility-administered medications for this encounter.    REVIEW OF SYSTEMS:  A 10+ POINT REVIEW OF SYSTEMS WAS OBTAINED including neurology, dermatology, psychiatry, cardiac, respiratory, lymph, extremities, GI, GU, musculoskeletal, constitutional, reproductive, HEENT.  She reports pain in the mid upper back region.  She denies any weakness in her lower extremities.  She reports some numbness along her feet which has been chronic in nature.   PHYSICAL EXAM:  height is 5' 3 (1.6 m) and weight is 181 lb 9.6 oz (82.4 kg). Her temperature is 97.9 F (36.6 C). Her blood pressure is 138/79 and her pulse is 93. Her respiration is 20 and oxygen saturation is 94%.   General: Alert and oriented, in no acute distress HEENT: Head is normocephalic. Extraocular movements are intact.  Neck:  Neck is supple, no palpable cervical or supraclavicular lymphadenopathy. Heart: Regular in rate and rhythm with no murmurs, rubs, or gallops. Chest: Clear to auscultation bilaterally, with no rhonchi, wheezes, or rales. Abdomen: Soft, nontender, nondistended, with no rigidity or guarding. Extremities: No cyanosis or edema. Lymphatics: see Neck Exam Skin: No concerning lesions. Musculoskeletal: symmetric strength and muscle tone throughout. Neurologic: Cranial nerves II through XII are grossly intact. No obvious focalities. Speech is fluent. Coordination is intact. Psychiatric: Judgment and insight are intact. Affect is appropriate. She reports tenderness with palpation in the mid upper back region.  ECOG = 1  0 - Asymptomatic (Fully active, able to carry on all predisease activities without restriction)  1 - Symptomatic but completely ambulatory (Restricted in physically strenuous activity but ambulatory and able to carry out work of a light or sedentary nature. For example, light housework, office work)  2 - Symptomatic, <50% in bed during the day (Ambulatory and capable of all self care but unable to carry out any work activities. Up and about more than 50% of waking hours)  3 - Symptomatic, >50% in bed, but not bedbound (Capable of only limited self-care, confined to bed or chair 50% or more of waking hours)  4 - Bedbound (Completely disabled. Cannot carry on any self-care. Totally confined to bed or chair)  5 - Death   Raylene MM, Creech RH, Tormey DC, et al. [phone]). Toxicity and response criteria of the [facility]. Am. DOROTHA Bridges. Oncol. 5 (6): 649-55  LABORATORY DATA:  Lab Results  Component Value Date   WBC 4.7 11/10/2023   HGB 11.4 (L) 11/10/2023   HCT 36.1 11/10/2023   MCV 76.6 (L) 11/10/2023   PLT 220 11/10/2023   NEUTROABS 2.9 11/10/2023   Lab Results  Component Value Date   NA 140 11/10/2023   K 4.0 11/10/2023   CL 106 11/10/2023   CO2 29  11/10/2023   GLUCOSE 257 (H) 11/10/2023   BUN 18 11/10/2023   CREATININE 0.96 11/10/2023   CALCIUM  9.2 11/10/2023      RADIOGRAPHY: NM PET Image Restag (PS) Skull Base To Thigh Addendum Date: 11/24/2023 ADDENDUM REPORT: 11/24/2023 11:07 TECHNIQUE: 8.5 mCi F-18 FDG was injected intravenously. Full-ring PET imaging was performed from the skull base to thigh after the radiotracer. CT data was obtained and used for attenuation correction and anatomic localization. Fasting blood glucose: 129 mg/dl Electronically Signed   By: Megan  Zare M.D.   On: 11/24/2023 11:07   Result Date: 11/24/2023 CLINICAL DATA:  Subsequent treatment strategy for right lower lobe non-small cell lung cancer. EXAM: NUCLEAR MEDICINE PET SKULL BASE TO THIGH TECHNIQUE: mCi F-18 FDG was injected intravenously. Full-ring PET imaging was performed from the skull base to thigh after the radiotracer. CT data was obtained and used for attenuation correction and anatomic localization. Fasting blood glucose:  mg/dl COMPARISON:  PET-CT February 05, 2023, chest CT October 08, 2023 FINDINGS: Mediastinal blood pool activity: SUV max 4.4 Liver activity: SUV max 4.2 NECK: No suspicious lymphadenopathy. Incidental CT findings: None. CHEST: Postradiation changes of the right lower lobe malignancy with expected FDG uptake and fibrotic changes along the perihilar right lower lobe and right upper lobe with air bronchograms max SUV 9.5. Primary malignancy is obscured by overlying postradiation fibrosis measures approximately 1.1 cm with max SUV 9.5. There is a more intense nodular FDG avid ground-glass component along the lateral border of the postradiation changes max SUV 11.1 measuring approximately 2.7 x 2.7 cm cm (7/21). This finding is new to prior CT and PET-CT. There is a new left lower lobe contralateral nodule measuring 1.2 cm with max SUV 5.9 concerning for metastasis. (7/29). Smaller nodule measuring 5 mm lateral to this nodule and below PET  resolution (7/29). Previously seen micronodule along the left upper lobe medial aspect is stable and below PET resolution (7/12). Increased right-sided pleural effusion now small versus previously trace. New trace left pleural effusion. Incidental CT findings: Porta catheter tip terminates in cavoatrial junction. Atherosclerotic calcifications of coronary arteries. The heart size is normal. No pericardial fluid. Patulous esophagus. ABDOMEN/PELVIS: No suspicious lesion to suggest metastatic disease. Colonic diverticulosis without diverticulitis. Uterine fibroids with coarse calcifications, stable and non FDG avid. No adnexal mass. Incidental CT findings: None. SKELETON: FDG avid lytic lesion involving T8 vertebral body new to prior and similar to recent CT max SUV 19.4 consistent with metastasis. Incidental CT findings: Posterior spinal fusion device. IMPRESSION: Right lower lobe malignancy with expected postradiation changes along the right parahilar with mild FDG uptake. New nodular FDG avid lesions in bilateral lower lobes as detailed above most consistent with metastasis. Interval increase in pleural effusion. These findings are progressed to prior PET-CT and recent chest CT. 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 Electronically  Signed: By: Megan  Zare M.D. On: 11/23/2023 19:28      IMPRESSION: Metastatic non-small cell lung cancer, adenocarcinoma initially diagnosed as stage IIIa (T1c, N2, M0) non-small cell lung cancer with evidence for disease progression with bilateral pulmonary nodules in addition to bone metastasis in September 2025.   She is quite symptomatic from her osseous metastasis at T8.  She would be a good candidate for short course of radiation therapy directed to this area.  She has had previous radiation therapy to the chest region so we will have to be careful about potential overlap with her previous radiation  treatments.  Today, I talked to the patient  about the findings and work-up thus far.  We discussed the natural history of non-small cell lung cancer and general treatment, highlighting the role of radiotherapy in the management.  We discussed the available radiation techniques, and focused on the details of logistics and delivery.  We reviewed the anticipated acute and late sequelae associated with radiation in this setting.  The patient was encouraged to ask questions that I answered to the best of my ability.  A patient consent form was discussed and signed.  We retained a copy for our records.  The patient would like to proceed with radiation and will be scheduled for CT simulation.  PLAN: She will return tomorrow for CT simulation with treatment to begin next week.  Anticipate 10 treatments directed at the T8 area.   45 minutes of total time was spent for this patient encounter, including preparation, face-to-face counseling with the patient and coordination of care, physical exam, and documentation of the encounter.   ------------------------------------------------  Lynwood CHARM Nasuti, PhD, MD  This document serves as a record of services personally performed by Lynwood Nasuti, MD. It was created on his behalf by Reymundo Cartwright, a trained medical scribe. The creation of this record is based on the scribe's personal observations and the provider's statements to them. This document has been checked and approved by the attending provider.

## 2023-12-02 NOTE — Telephone Encounter (Signed)
 Spoke with patient regarding dental clearance letter for upcoming procedure. Also spoke with dental assistant on Ms. Hopfer' phone regarding sending a dental clearance form to our office. Provided her with fax number [phone] and instructed her to confirm the specific procedure being performed. She voiced thanks.

## 2023-12-03 ENCOUNTER — Ambulatory Visit
Admission: RE | Admit: 2023-12-03 | Discharge: 2023-12-03 | Disposition: A | Discharge status: Home / Self Care | Source: Ambulatory Visit | Attending: Radiation Oncology | Admitting: Radiation Oncology

## 2023-12-03 DIAGNOSIS — C7951 Secondary malignant neoplasm of bone: Secondary | ICD-10-CM

## 2023-12-03 DIAGNOSIS — C3431 Malignant neoplasm of lower lobe, right bronchus or lung: Secondary | ICD-10-CM | POA: Diagnosis not present

## 2023-12-03 MED FILL — Fosaprepitant Dimeglumine For IV Infusion 150 MG (Base Eq): INTRAVENOUS | Qty: 5 | Status: AC

## 2023-12-04 ENCOUNTER — Encounter: Payer: Self-pay | Admitting: Internal Medicine

## 2023-12-04 ENCOUNTER — Telehealth: Payer: Self-pay

## 2023-12-04 ENCOUNTER — Other Ambulatory Visit: Payer: Self-pay | Admitting: Physician Assistant

## 2023-12-04 ENCOUNTER — Inpatient Hospital Stay

## 2023-12-04 ENCOUNTER — Inpatient Hospital Stay: Admitting: Licensed Clinical Social Worker

## 2023-12-04 ENCOUNTER — Ambulatory Visit

## 2023-12-04 DIAGNOSIS — Z79631 Long term (current) use of antimetabolite agent: Secondary | ICD-10-CM | POA: Insufficient documentation

## 2023-12-04 DIAGNOSIS — I10 Essential (primary) hypertension: Secondary | ICD-10-CM | POA: Insufficient documentation

## 2023-12-04 DIAGNOSIS — K219 Gastro-esophageal reflux disease without esophagitis: Secondary | ICD-10-CM | POA: Insufficient documentation

## 2023-12-04 DIAGNOSIS — Z5112 Encounter for antineoplastic immunotherapy: Secondary | ICD-10-CM | POA: Insufficient documentation

## 2023-12-04 DIAGNOSIS — C3431 Malignant neoplasm of lower lobe, right bronchus or lung: Secondary | ICD-10-CM

## 2023-12-04 DIAGNOSIS — Z79899 Other long term (current) drug therapy: Secondary | ICD-10-CM | POA: Insufficient documentation

## 2023-12-04 NOTE — Progress Notes (Signed)
 [facility] CSW Progress Note  Clinical Child psychotherapist contacted patient by phone to follow-up on SDOH needs.    Interventions: Pt reports financial difficulty at this time.  Per pt she has not applied for the [employer].  Pt receives SNAP benefits and meets presumptive eligibility requirement to apply for the [employer].  CSW provided pt w/ contact information for the patient financial resources specialist to apply.  CSW also informed pt of the [facility] food pantry, encouraging her to utilize the pantry when at the cancer center.  Pt inquired about the start of her radiation, as she has a dental procedure on 10/7.  Message sent to the radiation team regarding this appointment.        Follow Up Plan:  Patient will contact CSW with any support or resource needs    Devere JONELLE Manna, LCSW Clinical Social Worker [facility]

## 2023-12-04 NOTE — Telephone Encounter (Signed)
 Received a phone call from the patient inquiring whether the dental clearance form for her upcoming procedure on 12/08/23 was faxed to the dentist's office. Patient was informed that the dental clearance form was faxed to [phone] with confirmation.  Patient also inquired about coming in for her first infusion today. Informed patient that, per Dr. Jeannett note on the dental clearance form, the treatment start date will be postponed to 10/16 or 10/17, due to the dental procedure scheduled on 12/08/23. Patient verbalized understanding and agreement with the revised plan. Advised patient that the scheduling department will contact her with the new treatment start date. Patient voiced understanding and had no further questions at this time.

## 2023-12-09 ENCOUNTER — Ambulatory Visit: Admitting: Internal Medicine

## 2023-12-09 ENCOUNTER — Telehealth: Payer: Self-pay | Admitting: Internal Medicine

## 2023-12-09 ENCOUNTER — Inpatient Hospital Stay

## 2023-12-09 ENCOUNTER — Ambulatory Visit

## 2023-12-09 ENCOUNTER — Inpatient Hospital Stay: Admitting: Internal Medicine

## 2023-12-09 ENCOUNTER — Other Ambulatory Visit

## 2023-12-09 ENCOUNTER — Ambulatory Visit: Payer: Self-pay

## 2023-12-09 NOTE — Telephone Encounter (Signed)
 Left the patient a voicemail with the rescheduled appointment details.

## 2023-12-09 NOTE — Telephone Encounter (Signed)
 Noted

## 2023-12-09 NOTE — Telephone Encounter (Signed)
 Patient has administered her insulin . She is going to eat a small snack and drink small amounts of water as tolerated. Patient took ondansetron  4 mg and her insulin  as prescribed.  Will recheck BS in 30 minutes and call back if: BS remains elevated Nausea worsens Vomiting returns  Pt advised to go to the ED if intractable vomiting occurs. Her daughter is en route to her home.   Reason for Disposition  Taking prescription medication that could cause nausea (e.g., narcotics/opiates, antibiotics, OCPs, many others)  [1] Blood glucose > 300 mg/dL (83.2 mmol/L) AND [7] uses insulin  (e.g., insulin -dependent, all people with type 1 diabetes)  Answer Assessment - Initial Assessment Questions 1. NAUSEA SEVERITY: How bad is the nausea? (e.g., mild, moderate, severe; dehydration, weight loss)     Severe last night, improving slowly.  2. ONSET: When did the nausea begin?     Last night  3. VOMITING: Any vomiting? If Yes, ask: How many times today?     Yes, last night  Answer Assessment - Initial Assessment Questions Patient had tooth extractions yesterday and states that she has been experiencing nausea and vomiting that began last night. States vomiting stopped, nausea is improving but present when she sits up. Not currently on chemo or receiving radiation, states will in the future.  She believes nausea was due to taking norco she took last night in combination with meds used at dental office. Has not taken the norco today.  BS while on the phone with this RN was 311. Patient had held her glipizide  this morning d/t nausea. Pt self administered 20 units of tresiba  after checking her BS as prescribed. She has taken her ondansetron .  1. BLOOD GLUCOSE: What is your blood glucose level?      311  2. ONSET: When did you check the blood glucose?     1000 today while on the phone with this RN  4. KETONES: Do you check for ketones (urine or blood test strips)? If Yes, ask: What does  the test show now?      No, does not test  5. TYPE 1 or 2:  Do you know what type of diabetes you have?  (e.g., Type 1, Type 2, Gestational; doesn't know)      2  6. INSULIN : Do you take insulin ? What type of insulin (s) do you use? What is the mode of delivery? (syringe, pen; injection or pump)?      Tresiba   7. DIABETES PILLS: Do you take any pills for your diabetes? If Yes, ask: Have you missed taking any pills recently?     Januvia  and sitagliptin  (she held d/t nausea)  8. OTHER SYMPTOMS: Do you have any symptoms? (e.g., fever, frequent urination, difficulty breathing, dizziness, weakness, vomiting)     Nausea that she believes is r/t dental extractions on 12/08/23  Protocols used: Nausea-A-AH, Diabetes - High Blood Sugar-A-AH Copied from CRM #[id number]. Topic: Clinical - Red Word Triage >> Dec 09, 2023  9:47 AM Suzen RAMAN wrote: Red Word that prompted transfer to Nurse Triage: patient had 4 teeth extracted yesterday and has been vomiting all night due to medication prescribed after tooth extraction >> Dec 09, 2023  9:51 AM Suzen RAMAN wrote: HYDROcodone -acetaminophen  (NORCO) 10-325 MG tablet was the medication

## 2023-12-10 ENCOUNTER — Ambulatory Visit: Admitting: Family Medicine

## 2023-12-10 ENCOUNTER — Encounter: Payer: Self-pay | Admitting: Family Medicine

## 2023-12-10 ENCOUNTER — Other Ambulatory Visit: Payer: Self-pay

## 2023-12-10 VITALS — BP 110/70 | HR 79 | Temp 98.4°F | Wt 179.0 lb

## 2023-12-10 DIAGNOSIS — G8929 Other chronic pain: Secondary | ICD-10-CM | POA: Diagnosis not present

## 2023-12-10 DIAGNOSIS — M48062 Spinal stenosis, lumbar region with neurogenic claudication: Secondary | ICD-10-CM | POA: Diagnosis not present

## 2023-12-10 MED ORDER — OXYCODONE-ACETAMINOPHEN 10-325 MG PO TABS: 1.0000 | ORAL_TABLET | Freq: Four times a day (QID) | ORAL | 0 refills | Status: DC | PRN

## 2023-12-10 MED ORDER — PREGABALIN 100 MG PO CAPS: 100.0000 mg | ORAL_CAPSULE | Freq: Every day | ORAL | 1 refills | Status: DC

## 2023-12-10 NOTE — Progress Notes (Signed)
   Subjective:    Patient ID: Wendy [name], female    DOB: 12-28-1945, 78 y.o.   MRN: [id number]  HPI Here for a pain management visit. 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 Her Oncology team is preparing to start her on more chemotherapy and radiation therapy. Her Norco is not controlling her pain like it was. She still take Pregabalin  100 mg at bedtime. She cannot tolerate this during the day because it is too sedating.    Review of Systems  Constitutional: Negative.   Musculoskeletal:  Positive for back pain.       Objective:   Physical Exam Constitutional:      Comments: Walks with a cane   Neurological:     Mental Status: She is alert.           Assessment & Plan:  Pain management. Indication for chronic opioid: back pain  Medication and dose: Percocet 10-325 # pills per month: 120 Last UDS date: 12-10-23 Opioid Treatment Agreement signed (Y/N): 06-19-17 Opioid Treatment Agreement last reviewed with patient:  12-10-23 [facility] reviewed this encounter (include red flags): Yes We will stop the Norco and change to Percocet 10-325 as needed.  Garnette Olmsted, MD

## 2023-12-13 LAB — DRUG MONITORING, PANEL 8 WITH CONFIRMATION, URINE
6 Acetylmorphine: NEGATIVE ng/mL (ref <10)
Alcohol Metabolites: NEGATIVE ng/mL (ref <500)
Alphahydroxyalprazolam: 148 ng/mL — ABNORMAL HIGH (ref <25)
Alphahydroxymidazolam: NEGATIVE ng/mL (ref <50)
Alphahydroxytriazolam: NEGATIVE ng/mL (ref <50)
Aminoclonazepam: NEGATIVE ng/mL (ref <25)
Amphetamines: NEGATIVE ng/mL (ref <500)
Benzodiazepines: POSITIVE ng/mL — AB (ref <100)
Buprenorphine, Urine: NEGATIVE ng/mL (ref <5)
Buprenorphine: NEGATIVE ng/mL (ref <2)
Cocaine Metabolite: NEGATIVE ng/mL (ref <150)
Codeine: NEGATIVE ng/mL (ref <50)
Creatinine: 286 mg/dL (ref >=20.0)
Hydrocodone: 1856 ng/mL — ABNORMAL HIGH (ref <50)
Hydromorphone: 211 ng/mL — ABNORMAL HIGH (ref <50)
Hydroxyethylflurazepam: NEGATIVE ng/mL (ref <50)
Lorazepam: NEGATIVE ng/mL (ref <50)
MDMA: NEGATIVE ng/mL (ref <500)
Marijuana Metabolite: NEGATIVE ng/mL (ref <20)
Morphine: NEGATIVE ng/mL (ref <50)
Naloxone: NEGATIVE ng/mL (ref <2)
Norbuprenorphine: NEGATIVE ng/mL (ref <2)
Nordiazepam: NEGATIVE ng/mL (ref <50)
Norhydrocodone: 2473 ng/mL — ABNORMAL HIGH (ref <50)
Opiates: POSITIVE ng/mL — AB (ref <100)
Oxazepam: NEGATIVE ng/mL (ref <50)
Oxidant: NEGATIVE ug/mL (ref <200)
Oxycodone: NEGATIVE ng/mL (ref <100)
Temazepam: NEGATIVE ng/mL (ref <50)
pH: 5.6 (ref 4.5–9.0)

## 2023-12-13 LAB — DM TEMPLATE

## 2023-12-14 ENCOUNTER — Other Ambulatory Visit: Payer: Self-pay

## 2023-12-14 ENCOUNTER — Encounter: Payer: Self-pay | Admitting: Internal Medicine

## 2023-12-14 ENCOUNTER — Ambulatory Visit
Admission: RE | Admit: 2023-12-14 | Discharge: 2023-12-14 | Disposition: A | Discharge status: Home / Self Care | Source: Ambulatory Visit | Attending: Radiation Oncology | Admitting: Radiation Oncology

## 2023-12-14 DIAGNOSIS — C3431 Malignant neoplasm of lower lobe, right bronchus or lung: Secondary | ICD-10-CM | POA: Diagnosis not present

## 2023-12-14 DIAGNOSIS — C7951 Secondary malignant neoplasm of bone: Secondary | ICD-10-CM

## 2023-12-14 DIAGNOSIS — Z510 Encounter for antineoplastic radiation therapy: Secondary | ICD-10-CM | POA: Diagnosis not present

## 2023-12-14 LAB — RAD ONC ARIA SESSION SUMMARY
Course Elapsed Days: 0
Plan Fractions Treated to Date: 1
Plan Prescribed Dose Per Fraction: 2 Gy
Plan Total Fractions Prescribed: 10
Plan Total Prescribed Dose: 20 Gy
Reference Point Dosage Given to Date: 2 Gy
Reference Point Session Dosage Given: 2 Gy
Session Number: 1

## 2023-12-14 NOTE — Progress Notes (Signed)
 Spoke w/ pt to introduce myself as her Dance movement psychotherapist and to discuss the [employer].  Pt would like to apply so she will bring her proof of income and proof of SNAP benefits.  Once received I will give her an expense sheet and my card for any questions or concerns she may have in the future.

## 2023-12-14 NOTE — Progress Notes (Signed)
 Pt is approved for the $1000 [employer].

## 2023-12-15 ENCOUNTER — Ambulatory Visit
Admission: RE | Admit: 2023-12-15 | Discharge: 2023-12-15 | Disposition: A | Discharge status: Home / Self Care | Source: Ambulatory Visit | Attending: Radiation Oncology | Admitting: Radiation Oncology

## 2023-12-15 ENCOUNTER — Encounter: Payer: Self-pay | Admitting: Internal Medicine

## 2023-12-15 ENCOUNTER — Other Ambulatory Visit: Payer: Self-pay

## 2023-12-15 DIAGNOSIS — Z510 Encounter for antineoplastic radiation therapy: Secondary | ICD-10-CM | POA: Diagnosis not present

## 2023-12-15 DIAGNOSIS — C7951 Secondary malignant neoplasm of bone: Secondary | ICD-10-CM | POA: Diagnosis not present

## 2023-12-15 DIAGNOSIS — C3431 Malignant neoplasm of lower lobe, right bronchus or lung: Secondary | ICD-10-CM | POA: Diagnosis not present

## 2023-12-15 LAB — RAD ONC ARIA SESSION SUMMARY
Course Elapsed Days: 1
Plan Fractions Treated to Date: 2
Plan Prescribed Dose Per Fraction: 2 Gy
Plan Total Fractions Prescribed: 10
Plan Total Prescribed Dose: 20 Gy
Reference Point Dosage Given to Date: 4 Gy
Reference Point Session Dosage Given: 2 Gy
Session Number: 2

## 2023-12-15 MED FILL — Fosaprepitant Dimeglumine For IV Infusion 150 MG (Base Eq): INTRAVENOUS | Qty: 5 | Status: AC

## 2023-12-16 ENCOUNTER — Ambulatory Visit
Admission: RE | Admit: 2023-12-16 | Discharge: 2023-12-16 | Disposition: A | Discharge status: Home / Self Care | Source: Ambulatory Visit | Attending: Radiation Oncology | Admitting: Radiation Oncology

## 2023-12-16 ENCOUNTER — Inpatient Hospital Stay

## 2023-12-16 ENCOUNTER — Encounter: Payer: Self-pay | Admitting: Internal Medicine

## 2023-12-16 ENCOUNTER — Telehealth: Payer: Self-pay

## 2023-12-16 ENCOUNTER — Other Ambulatory Visit: Payer: Self-pay

## 2023-12-16 VITALS — BP 154/76 | HR 87 | Temp 97.7°F | Resp 18 | Wt 179.0 lb

## 2023-12-16 DIAGNOSIS — C3431 Malignant neoplasm of lower lobe, right bronchus or lung: Secondary | ICD-10-CM

## 2023-12-16 DIAGNOSIS — C7951 Secondary malignant neoplasm of bone: Secondary | ICD-10-CM | POA: Diagnosis not present

## 2023-12-16 DIAGNOSIS — Z510 Encounter for antineoplastic radiation therapy: Secondary | ICD-10-CM | POA: Diagnosis not present

## 2023-12-16 LAB — RAD ONC ARIA SESSION SUMMARY
Course Elapsed Days: 2
Plan Fractions Treated to Date: 3
Plan Prescribed Dose Per Fraction: 2 Gy
Plan Total Fractions Prescribed: 10
Plan Total Prescribed Dose: 20 Gy
Reference Point Dosage Given to Date: 6 Gy
Reference Point Session Dosage Given: 2 Gy
Session Number: 3

## 2023-12-16 LAB — CMP (CANCER CENTER ONLY)
ALT: 9 U/L (ref 0–44)
AST: 13 U/L — ABNORMAL LOW (ref 15–41)
Albumin: 3.5 g/dL (ref 3.5–5.0)
Alkaline Phosphatase: 88 U/L (ref 38–126)
Anion gap: 5 (ref 5–15)
BUN: 11 mg/dL (ref 8–23)
CO2: 28 mmol/L (ref 22–32)
Calcium: 9.2 mg/dL (ref 8.9–10.3)
Chloride: 105 mmol/L (ref 98–111)
Creatinine: 0.79 mg/dL (ref 0.44–1.00)
GFR, Estimated: >60 mL/min
Glucose, Bld: 242 mg/dL — ABNORMAL HIGH (ref 70–99)
Potassium: 3.7 mmol/L (ref 3.5–5.1)
Sodium: 138 mmol/L (ref 135–145)
Total Bilirubin: 0.7 mg/dL (ref 0.0–1.2)
Total Protein: 7.7 g/dL (ref 6.5–8.1)

## 2023-12-16 LAB — CBC WITH DIFFERENTIAL (CANCER CENTER ONLY)
Abs Immature Granulocytes: 0.05 K/uL (ref 0.00–0.07)
Basophils Absolute: 0 K/uL (ref 0.0–0.1)
Basophils Relative: 1 %
Eosinophils Absolute: 0 K/uL (ref 0.0–0.5)
Eosinophils Relative: 0 %
HCT: 34.6 % — ABNORMAL LOW (ref 36.0–46.0)
Hemoglobin: 10.7 g/dL — ABNORMAL LOW (ref 12.0–15.0)
Immature Granulocytes: 1 %
Lymphocytes Relative: 10 %
Lymphs Abs: 0.5 K/uL — ABNORMAL LOW (ref 0.7–4.0)
MCH: 24 pg — ABNORMAL LOW (ref 26.0–34.0)
MCHC: 30.9 g/dL (ref 30.0–36.0)
MCV: 77.6 fL — ABNORMAL LOW (ref 80.0–100.0)
Monocytes Absolute: 0.5 K/uL (ref 0.1–1.0)
Monocytes Relative: 10 %
Neutro Abs: 3.6 K/uL (ref 1.7–7.7)
Neutrophils Relative %: 78 %
Platelet Count: 218 K/uL (ref 150–400)
RBC: 4.46 MIL/uL (ref 3.87–5.11)
RDW: 18.4 % — ABNORMAL HIGH (ref 11.5–15.5)
WBC Count: 4.6 K/uL (ref 4.0–10.5)
nRBC: 0 % (ref 0.0–0.2)

## 2023-12-16 LAB — TOTAL PROTEIN, URINE DIPSTICK: Protein, ur: NEGATIVE mg/dL

## 2023-12-16 MED ORDER — SODIUM CHLORIDE 0.9 % IV SOLN: INTRAVENOUS | Status: DC

## 2023-12-16 MED ORDER — SODIUM CHLORIDE 0.9 % IV SOLN
150.0000 mg | Freq: Once | INTRAVENOUS | Status: AC
  Administered 2023-12-16: 150 mg via INTRAVENOUS
  Filled 2023-12-16: qty 5
  Filled 2023-12-16: qty 150

## 2023-12-16 MED ORDER — PALONOSETRON HCL INJECTION 0.25 MG/5ML
0.2500 mg | Freq: Once | INTRAVENOUS | Status: AC
  Administered 2023-12-16: 0.25 mg via INTRAVENOUS
  Filled 2023-12-16: qty 5

## 2023-12-16 MED ORDER — SODIUM CHLORIDE 0.9 % IV SOLN
438.5000 mg | Freq: Once | INTRAVENOUS | Status: AC
  Administered 2023-12-16: 440 mg via INTRAVENOUS
  Filled 2023-12-16: qty 44

## 2023-12-16 MED ORDER — DEXAMETHASONE SOD PHOSPHATE PF 10 MG/ML IJ SOLN
10.0000 mg | Freq: Once | INTRAMUSCULAR | Status: AC
  Administered 2023-12-16: 10 mg via INTRAVENOUS

## 2023-12-16 MED ORDER — SODIUM CHLORIDE 0.9 % IV SOLN
500.0000 mg/m2 | Freq: Once | INTRAVENOUS | Status: AC
  Administered 2023-12-16: 1000 mg via INTRAVENOUS
  Filled 2023-12-16: qty 40

## 2023-12-16 NOTE — Telephone Encounter (Signed)
 Patient called inquiring about her upcoming appointments. Informed patient that she is scheduled today for port flush with labs at 11:45 AM, radiation at 12:15 PM, and infusion at 1:00 PM.  Patient asked about taking Decadron  today. Confirmed with Cassie, PA, that she can take Decadron  today and follow the directions on the bottle for after treatment. Patient voiced understanding.

## 2023-12-16 NOTE — Progress Notes (Signed)
 Per Dr. Sherrod & Cassie, holding bevacizumab today due to recent dental work on 10/7. No urine protein needed today.  Harlene Nasuti, PharmD Oncology Infusion Pharmacist 12/16/2023 1:37 PM

## 2023-12-16 NOTE — Patient Instructions (Signed)
 [facility] - A DEPT OF . [facility]  Discharge Instructions: Thank you for choosing [facility] to provide your oncology and hematology care.   If you have a lab appointment with the Cancer Center, please go directly to the Cancer Center and check in at the registration area.   Wear comfortable clothing and clothing appropriate for easy access to any Portacath or PICC line.   We strive to give you quality time with your provider. You may need to reschedule your appointment if you arrive late (15 or more minutes).  Arriving late affects you and other patients whose appointments are after yours.  Also, if you miss three or more appointments without notifying the office, you may be dismissed from the clinic at the provider's discretion.      For prescription refill requests, have your pharmacy contact our office and allow 72 hours for refills to be completed.    Today you received the following chemotherapy and/or immunotherapy agents Alimta, Carboplatin       To help prevent nausea and vomiting after your treatment, we encourage you to take your nausea medication as directed.  BELOW ARE SYMPTOMS THAT SHOULD BE REPORTED IMMEDIATELY: *FEVER GREATER THAN 100.4 F (38 C) OR HIGHER *CHILLS OR SWEATING *NAUSEA AND VOMITING THAT IS NOT CONTROLLED WITH YOUR NAUSEA MEDICATION *UNUSUAL SHORTNESS OF BREATH *UNUSUAL BRUISING OR BLEEDING *URINARY PROBLEMS (pain or burning when urinating, or frequent urination) *BOWEL PROBLEMS (unusual diarrhea, constipation, pain near the anus) TENDERNESS IN MOUTH AND THROAT WITH OR WITHOUT PRESENCE OF ULCERS (sore throat, sores in mouth, or a toothache) UNUSUAL RASH, SWELLING OR PAIN  UNUSUAL VAGINAL DISCHARGE OR ITCHING   Items with * indicate a potential emergency and should be followed up as soon as possible or go to the Emergency Department if any problems should occur.  Please show the CHEMOTHERAPY ALERT CARD or  IMMUNOTHERAPY ALERT CARD at check-in to the Emergency Department and triage nurse.  Should you have questions after your visit or need to cancel or reschedule your appointment, please contact [facility] - A DEPT OF [facility]  Dept: [phone]  and follow the prompts.  Office hours are 8:00 a.m. to 4:30 p.m. Monday - Friday. Please note that voicemails left after 4:00 p.m. may not be returned until the following business day.  We are closed weekends and major holidays. You have access to a nurse at all times for urgent questions. Please call the main number to the clinic Dept: [phone] and follow the prompts.   For any non-urgent questions, you may also contact your provider using MyChart. We now offer e-Visits for anyone 57 and older to request care online for non-urgent symptoms. For details visit [email or web address].   Also download the MyChart app! Go to the app store, search MyChart, open the app, select [facility], and log in with your MyChart username and password.  Pemetrexed Injection What is this medication? PEMETREXED (PEM e TREX ed) treats some types of cancer. It works by slowing down the growth of cancer cells. This medicine may be used for other purposes; ask your health care provider or pharmacist if you have questions. COMMON BRAND NAME(S): Alimta, PEMFEXY, PEMRYDI RTU What should I tell my care team before I take this medication? They need to know if you have any of these conditions: Infection, such as chickenpox, cold sores, or herpes Kidney disease Low blood cell levels (white cells,  red cells, and platelets) Lung or breathing disease, such as asthma Radiation therapy An unusual or allergic reaction to pemetrexed, other medications, foods, dyes, or preservatives If you or your partner are pregnant or trying to get pregnant Breast-feeding How should I use this medication? This medication is injected into a vein. It is given by  your care team in a hospital or clinic setting. Talk to your care team about the use of this medication in children. Special care may be needed. Overdosage: If you think you have taken too much of this medicine contact a poison control center or emergency room at once. NOTE: This medicine is only for you. Do not share this medicine with others. What if I miss a dose? Keep appointments for follow-up doses. It is important not to miss your dose. Call your care team if you are unable to keep an appointment. What may interact with this medication? Do not take this medication with any of the following: Live virus vaccines This medication may also interact with the following: Ibuprofen  This list may not describe all possible interactions. Give your health care provider a list of all the medicines, herbs, non-prescription drugs, or dietary supplements you use. Also tell them if you smoke, drink alcohol, or use illegal drugs. Some items may interact with your medicine. What should I watch for while using this medication? Your condition will be monitored carefully while you are receiving this medication. This medication may make you feel generally unwell. This is not uncommon as chemotherapy can affect healthy cells as well as cancer cells. Report any side effects. Continue your course of treatment even though you feel ill unless your care team tells you to stop. This medication can cause serious side effects. To reduce the risk, your care team may give you other medications to take before receiving this one. Be sure to follow the directions from your care team. This medication can cause a rash or redness in areas of the body that have previously had radiation therapy. If you have had radiation therapy, tell your care team if you notice a rash in this area. This medication may increase your risk of getting an infection. Call your care team for advice if you get a fever, chills, sore throat, or other symptoms  of a cold or flu. Do not treat yourself. Try to avoid being around people who are sick. Be careful brushing or flossing your teeth or using a toothpick because you may get an infection or bleed more easily. If you have any dental work done, tell your dentist you are receiving this medication. Avoid taking medications that contain aspirin, acetaminophen , ibuprofen , naproxen , or ketoprofen unless instructed by your care team. These medications may hide a fever. Check with your care team if you have severe diarrhea, nausea, and vomiting, or if you sweat a lot. The loss of too much body fluid may make it dangerous for you to take this medication. Talk to your care team if you or your partner wish to become pregnant or think either of you might be pregnant. This medication can cause serious birth defects if taken during pregnancy and for 6 months after the last dose. A negative pregnancy test is required before starting this medication. A reliable form of contraception is recommended while taking this medication and for 6 months after the last dose. Talk to your care team about reliable forms of contraception. Do not father a child while taking this medication and for 3 months after the last  dose. Use a condom while having sex during this time period. Do not breastfeed while taking this medication and for 1 week after the last dose. This medication may cause infertility. Talk to your care team if you are concerned about your fertility. What side effects may I notice from receiving this medication? Side effects that you should report to your care team as soon as possible: Allergic reactions--skin rash, itching, hives, swelling of the face, lips, tongue, or throat Dry cough, shortness of breath or trouble breathing Infection--fever, chills, cough, sore throat, wounds that don't heal, pain or trouble when passing urine, general feeling of discomfort or being unwell Kidney injury--decrease in the amount of urine,  swelling of the ankles, hands, or feet Low red blood cell level--unusual weakness or fatigue, dizziness, headache, trouble breathing Redness, blistering, peeling, or loosening of the skin, including inside the mouth Unusual bruising or bleeding Side effects that usually do not require medical attention (report to your care team if they continue or are bothersome): Fatigue Loss of appetite Nausea Vomiting This list may not describe all possible side effects. Call your doctor for medical advice about side effects. You may report side effects to FDA at 1-800-FDA-1088. Where should I keep my medication? This medication is given in a hospital or clinic. It will not be stored at home. NOTE: This sheet is a summary. It may not cover all possible information. If you have questions about this medicine, talk to your doctor, pharmacist, or health care provider.  2024 Elsevier/Gold Standard (2021-06-25 00:00:00)

## 2023-12-17 ENCOUNTER — Ambulatory Visit
Admission: RE | Admit: 2023-12-17 | Discharge: 2023-12-17 | Disposition: A | Discharge status: Home / Self Care | Source: Ambulatory Visit | Attending: Radiation Oncology | Admitting: Radiation Oncology

## 2023-12-17 ENCOUNTER — Other Ambulatory Visit: Payer: Self-pay

## 2023-12-17 DIAGNOSIS — C7951 Secondary malignant neoplasm of bone: Secondary | ICD-10-CM | POA: Diagnosis not present

## 2023-12-17 DIAGNOSIS — Z510 Encounter for antineoplastic radiation therapy: Secondary | ICD-10-CM | POA: Diagnosis not present

## 2023-12-17 DIAGNOSIS — C3431 Malignant neoplasm of lower lobe, right bronchus or lung: Secondary | ICD-10-CM | POA: Diagnosis not present

## 2023-12-17 LAB — RAD ONC ARIA SESSION SUMMARY
Course Elapsed Days: 3
Plan Fractions Treated to Date: 4
Plan Prescribed Dose Per Fraction: 2 Gy
Plan Total Fractions Prescribed: 10
Plan Total Prescribed Dose: 20 Gy
Reference Point Dosage Given to Date: 8 Gy
Reference Point Session Dosage Given: 2 Gy
Session Number: 4

## 2023-12-18 ENCOUNTER — Telehealth: Payer: Self-pay

## 2023-12-18 ENCOUNTER — Encounter: Payer: Self-pay | Admitting: Internal Medicine

## 2023-12-18 ENCOUNTER — Other Ambulatory Visit: Payer: Self-pay

## 2023-12-18 ENCOUNTER — Ambulatory Visit
Admission: RE | Admit: 2023-12-18 | Discharge: 2023-12-18 | Disposition: A | Discharge status: Home / Self Care | Source: Ambulatory Visit | Attending: Radiation Oncology | Admitting: Radiation Oncology

## 2023-12-18 DIAGNOSIS — C3431 Malignant neoplasm of lower lobe, right bronchus or lung: Secondary | ICD-10-CM | POA: Diagnosis not present

## 2023-12-18 DIAGNOSIS — C7951 Secondary malignant neoplasm of bone: Secondary | ICD-10-CM | POA: Diagnosis not present

## 2023-12-18 DIAGNOSIS — Z510 Encounter for antineoplastic radiation therapy: Secondary | ICD-10-CM | POA: Diagnosis not present

## 2023-12-18 LAB — RAD ONC ARIA SESSION SUMMARY
Course Elapsed Days: 4
Plan Fractions Treated to Date: 5
Plan Prescribed Dose Per Fraction: 2 Gy
Plan Total Fractions Prescribed: 10
Plan Total Prescribed Dose: 20 Gy
Reference Point Dosage Given to Date: 10 Gy
Reference Point Session Dosage Given: 2 Gy
Session Number: 5

## 2023-12-18 NOTE — Telephone Encounter (Signed)
 Patient called and inquired about dexamethasone  prescription. Informed patient that she should take 1 tablet by mouth twice daily starting the day before treatment, then take 2 tablets (one in the morning and one in the late afternoon) for 3 days after treatment. Patient stated she took 6 tablets the day after treatment and 4 tablets today. Informed patient that this was okay and not harmful, though it may cause increased energy. Patient voiced understanding of the correct dosing instructions moving forward.

## 2023-12-21 ENCOUNTER — Other Ambulatory Visit: Payer: Self-pay

## 2023-12-21 ENCOUNTER — Ambulatory Visit
Admission: RE | Admit: 2023-12-21 | Discharge: 2023-12-21 | Disposition: A | Discharge status: Home / Self Care | Source: Ambulatory Visit | Attending: Radiation Oncology | Admitting: Radiation Oncology

## 2023-12-21 DIAGNOSIS — Z510 Encounter for antineoplastic radiation therapy: Secondary | ICD-10-CM | POA: Diagnosis not present

## 2023-12-21 DIAGNOSIS — C7951 Secondary malignant neoplasm of bone: Secondary | ICD-10-CM | POA: Diagnosis not present

## 2023-12-21 DIAGNOSIS — C3431 Malignant neoplasm of lower lobe, right bronchus or lung: Secondary | ICD-10-CM | POA: Diagnosis not present

## 2023-12-21 LAB — RAD ONC ARIA SESSION SUMMARY
Course Elapsed Days: 7
Plan Fractions Treated to Date: 6
Plan Prescribed Dose Per Fraction: 2 Gy
Plan Total Fractions Prescribed: 10
Plan Total Prescribed Dose: 20 Gy
Reference Point Dosage Given to Date: 12 Gy
Reference Point Session Dosage Given: 2 Gy
Session Number: 6

## 2023-12-22 ENCOUNTER — Ambulatory Visit
Admission: RE | Admit: 2023-12-22 | Discharge: 2023-12-22 | Disposition: A | Discharge status: Home / Self Care | Source: Ambulatory Visit | Attending: Radiation Oncology | Admitting: Radiation Oncology

## 2023-12-22 ENCOUNTER — Other Ambulatory Visit: Payer: Self-pay

## 2023-12-22 DIAGNOSIS — Z510 Encounter for antineoplastic radiation therapy: Secondary | ICD-10-CM | POA: Diagnosis not present

## 2023-12-22 DIAGNOSIS — C3431 Malignant neoplasm of lower lobe, right bronchus or lung: Secondary | ICD-10-CM | POA: Diagnosis not present

## 2023-12-22 DIAGNOSIS — C7951 Secondary malignant neoplasm of bone: Secondary | ICD-10-CM | POA: Diagnosis not present

## 2023-12-22 LAB — RAD ONC ARIA SESSION SUMMARY
Course Elapsed Days: 8
Plan Fractions Treated to Date: 7
Plan Prescribed Dose Per Fraction: 2 Gy
Plan Total Fractions Prescribed: 10
Plan Total Prescribed Dose: 20 Gy
Reference Point Dosage Given to Date: 14 Gy
Reference Point Session Dosage Given: 2 Gy
Session Number: 7

## 2023-12-23 ENCOUNTER — Inpatient Hospital Stay: Admitting: Physician Assistant

## 2023-12-23 ENCOUNTER — Inpatient Hospital Stay

## 2023-12-23 ENCOUNTER — Other Ambulatory Visit: Payer: Self-pay

## 2023-12-23 ENCOUNTER — Ambulatory Visit
Admission: RE | Admit: 2023-12-23 | Discharge: 2023-12-23 | Disposition: A | Source: Ambulatory Visit | Attending: Radiation Oncology | Admitting: Radiation Oncology

## 2023-12-23 DIAGNOSIS — C7951 Secondary malignant neoplasm of bone: Secondary | ICD-10-CM | POA: Diagnosis not present

## 2023-12-23 DIAGNOSIS — Z510 Encounter for antineoplastic radiation therapy: Secondary | ICD-10-CM | POA: Diagnosis not present

## 2023-12-23 DIAGNOSIS — C3431 Malignant neoplasm of lower lobe, right bronchus or lung: Secondary | ICD-10-CM | POA: Diagnosis not present

## 2023-12-23 LAB — RAD ONC ARIA SESSION SUMMARY
Course Elapsed Days: 9
Plan Fractions Treated to Date: 8
Plan Prescribed Dose Per Fraction: 2 Gy
Plan Total Fractions Prescribed: 10
Plan Total Prescribed Dose: 20 Gy
Reference Point Dosage Given to Date: 16 Gy
Reference Point Session Dosage Given: 2 Gy
Session Number: 8

## 2023-12-24 ENCOUNTER — Other Ambulatory Visit: Payer: Self-pay

## 2023-12-24 ENCOUNTER — Telehealth: Payer: Self-pay | Admitting: Radiation Oncology

## 2023-12-24 ENCOUNTER — Ambulatory Visit
Admission: RE | Admit: 2023-12-24 | Discharge: 2023-12-24 | Disposition: A | Source: Ambulatory Visit | Attending: Radiation Oncology | Admitting: Radiation Oncology

## 2023-12-24 DIAGNOSIS — Z510 Encounter for antineoplastic radiation therapy: Secondary | ICD-10-CM | POA: Diagnosis not present

## 2023-12-24 DIAGNOSIS — C7951 Secondary malignant neoplasm of bone: Secondary | ICD-10-CM | POA: Diagnosis not present

## 2023-12-24 DIAGNOSIS — C3431 Malignant neoplasm of lower lobe, right bronchus or lung: Secondary | ICD-10-CM | POA: Diagnosis not present

## 2023-12-24 DIAGNOSIS — E119 Type 2 diabetes mellitus without complications: Secondary | ICD-10-CM | POA: Diagnosis not present

## 2023-12-24 DIAGNOSIS — Z79899 Other long term (current) drug therapy: Secondary | ICD-10-CM | POA: Diagnosis not present

## 2023-12-24 LAB — RAD ONC ARIA SESSION SUMMARY
Course Elapsed Days: 10
Plan Fractions Treated to Date: 9
Plan Prescribed Dose Per Fraction: 2 Gy
Plan Total Fractions Prescribed: 10
Plan Total Prescribed Dose: 20 Gy
Reference Point Dosage Given to Date: 18 Gy
Reference Point Session Dosage Given: 2 Gy
Session Number: 9

## 2023-12-24 LAB — OPHTHALMOLOGY REPORT-SCANNED

## 2023-12-24 NOTE — Telephone Encounter (Signed)
 10/23 Called patient after received voicemail, she believes, she left her cane in RadOnc, no cane left in our dept, called front desk on 1st floor, they have patient's cane and will reach out to patient, so she is aware.

## 2023-12-25 ENCOUNTER — Ambulatory Visit
Admission: RE | Admit: 2023-12-25 | Discharge: 2023-12-25 | Disposition: A | Discharge status: Home / Self Care | Source: Ambulatory Visit | Attending: Radiation Oncology | Admitting: Radiation Oncology

## 2023-12-25 ENCOUNTER — Other Ambulatory Visit: Payer: Self-pay

## 2023-12-25 DIAGNOSIS — Z510 Encounter for antineoplastic radiation therapy: Secondary | ICD-10-CM | POA: Diagnosis not present

## 2023-12-25 DIAGNOSIS — C3431 Malignant neoplasm of lower lobe, right bronchus or lung: Secondary | ICD-10-CM | POA: Diagnosis not present

## 2023-12-25 DIAGNOSIS — C7951 Secondary malignant neoplasm of bone: Secondary | ICD-10-CM | POA: Diagnosis not present

## 2023-12-25 LAB — RAD ONC ARIA SESSION SUMMARY
Course Elapsed Days: 11
Plan Fractions Treated to Date: 10
Plan Prescribed Dose Per Fraction: 2 Gy
Plan Total Fractions Prescribed: 10
Plan Total Prescribed Dose: 20 Gy
Reference Point Dosage Given to Date: 20 Gy
Reference Point Session Dosage Given: 2 Gy
Session Number: 10

## 2023-12-28 NOTE — Radiation Completion Notes (Addendum)
  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 She developed fatigue and minimal esophageal symptoms. Patient noted an improvement in her back pain throughout her treatment.   The patient will return in one month and will continue follow up with Dr. Sherrod as well.      Ronita Due, PA-C

## 2024-01-04 ENCOUNTER — Encounter: Payer: Self-pay | Admitting: Radiology

## 2024-01-05 MED FILL — Fosaprepitant Dimeglumine For IV Infusion 150 MG (Base Eq): INTRAVENOUS | Qty: 5 | Status: AC

## 2024-01-06 ENCOUNTER — Other Ambulatory Visit: Payer: Self-pay | Admitting: Internal Medicine

## 2024-01-06 ENCOUNTER — Inpatient Hospital Stay

## 2024-01-06 ENCOUNTER — Inpatient Hospital Stay (HOSPITAL_BASED_OUTPATIENT_CLINIC_OR_DEPARTMENT_OTHER): Admitting: Internal Medicine

## 2024-01-06 ENCOUNTER — Inpatient Hospital Stay: Attending: Internal Medicine

## 2024-01-06 VITALS — BP 124/68 | HR 82 | Temp 98.0°F | Resp 17 | Ht 63.0 in | Wt 186.0 lb

## 2024-01-06 DIAGNOSIS — Z885 Allergy status to narcotic agent status: Secondary | ICD-10-CM | POA: Insufficient documentation

## 2024-01-06 DIAGNOSIS — Z923 Personal history of irradiation: Secondary | ICD-10-CM | POA: Insufficient documentation

## 2024-01-06 DIAGNOSIS — C3431 Malignant neoplasm of lower lobe, right bronchus or lung: Secondary | ICD-10-CM | POA: Insufficient documentation

## 2024-01-06 DIAGNOSIS — Z5112 Encounter for antineoplastic immunotherapy: Secondary | ICD-10-CM | POA: Insufficient documentation

## 2024-01-06 DIAGNOSIS — Z9849 Cataract extraction status, unspecified eye: Secondary | ICD-10-CM | POA: Insufficient documentation

## 2024-01-06 DIAGNOSIS — M069 Rheumatoid arthritis, unspecified: Secondary | ICD-10-CM | POA: Insufficient documentation

## 2024-01-06 DIAGNOSIS — C7951 Secondary malignant neoplasm of bone: Secondary | ICD-10-CM | POA: Insufficient documentation

## 2024-01-06 DIAGNOSIS — I10 Essential (primary) hypertension: Secondary | ICD-10-CM | POA: Insufficient documentation

## 2024-01-06 DIAGNOSIS — R5383 Other fatigue: Secondary | ICD-10-CM | POA: Insufficient documentation

## 2024-01-06 DIAGNOSIS — Z79899 Other long term (current) drug therapy: Secondary | ICD-10-CM | POA: Insufficient documentation

## 2024-01-06 DIAGNOSIS — Z886 Allergy status to analgesic agent status: Secondary | ICD-10-CM | POA: Insufficient documentation

## 2024-01-06 DIAGNOSIS — R0609 Other forms of dyspnea: Secondary | ICD-10-CM | POA: Insufficient documentation

## 2024-01-06 DIAGNOSIS — Z9221 Personal history of antineoplastic chemotherapy: Secondary | ICD-10-CM | POA: Insufficient documentation

## 2024-01-06 DIAGNOSIS — Z8619 Personal history of other infectious and parasitic diseases: Secondary | ICD-10-CM | POA: Insufficient documentation

## 2024-01-06 DIAGNOSIS — M2550 Pain in unspecified joint: Secondary | ICD-10-CM | POA: Insufficient documentation

## 2024-01-06 DIAGNOSIS — Z9226 Personal history of immune checkpoint inhibitor therapy: Secondary | ICD-10-CM | POA: Insufficient documentation

## 2024-01-06 DIAGNOSIS — M549 Dorsalgia, unspecified: Secondary | ICD-10-CM | POA: Insufficient documentation

## 2024-01-06 LAB — CBC WITH DIFFERENTIAL (CANCER CENTER ONLY)
Abs Immature Granulocytes: 0.04 K/uL (ref 0.00–0.07)
Basophils Absolute: 0 K/uL (ref 0.0–0.1)
Basophils Relative: 1 %
Eosinophils Absolute: 0 K/uL (ref 0.0–0.5)
Eosinophils Relative: 0 %
HCT: 32.9 % — ABNORMAL LOW (ref 36.0–46.0)
Hemoglobin: 10.2 g/dL — ABNORMAL LOW (ref 12.0–15.0)
Immature Granulocytes: 1 %
Lymphocytes Relative: 13 %
Lymphs Abs: 0.5 K/uL — ABNORMAL LOW (ref 0.7–4.0)
MCH: 23.9 pg — ABNORMAL LOW (ref 26.0–34.0)
MCHC: 31 g/dL (ref 30.0–36.0)
MCV: 77.2 fL — ABNORMAL LOW (ref 80.0–100.0)
Monocytes Absolute: 0.9 K/uL (ref 0.1–1.0)
Monocytes Relative: 25 %
Neutro Abs: 2.1 K/uL (ref 1.7–7.7)
Neutrophils Relative %: 60 %
Platelet Count: 201 K/uL (ref 150–400)
RBC: 4.26 MIL/uL (ref 3.87–5.11)
RDW: 18.2 % — ABNORMAL HIGH (ref 11.5–15.5)
WBC Count: 3.4 K/uL — ABNORMAL LOW (ref 4.0–10.5)
nRBC: 0 % (ref 0.0–0.2)

## 2024-01-06 LAB — CMP (CANCER CENTER ONLY)
ALT: 11 U/L (ref 0–44)
AST: 11 U/L — ABNORMAL LOW (ref 15–41)
Albumin: 3.5 g/dL (ref 3.5–5.0)
Alkaline Phosphatase: 87 U/L (ref 38–126)
Anion gap: 5 (ref 5–15)
BUN: 14 mg/dL (ref 8–23)
CO2: 27 mmol/L (ref 22–32)
Calcium: 9 mg/dL (ref 8.9–10.3)
Chloride: 106 mmol/L (ref 98–111)
Creatinine: 0.9 mg/dL (ref 0.44–1.00)
GFR, Estimated: >60 mL/min (ref >60)
Glucose, Bld: 195 mg/dL — ABNORMAL HIGH (ref 70–99)
Potassium: 4.3 mmol/L (ref 3.5–5.1)
Sodium: 138 mmol/L (ref 135–145)
Total Bilirubin: 0.5 mg/dL (ref 0.0–1.2)
Total Protein: 7.2 g/dL (ref 6.5–8.1)

## 2024-01-06 MED ORDER — SODIUM CHLORIDE 0.9 % IV SOLN
150.0000 mg | Freq: Once | INTRAVENOUS | Status: AC
  Administered 2024-01-06: 150 mg via INTRAVENOUS
  Filled 2024-01-06: qty 150

## 2024-01-06 MED ORDER — SODIUM CHLORIDE 0.9 % IV SOLN
500.0000 mg/m2 | Freq: Once | INTRAVENOUS | Status: AC
  Administered 2024-01-06: 1000 mg via INTRAVENOUS
  Filled 2024-01-06: qty 40

## 2024-01-06 MED ORDER — SODIUM CHLORIDE 0.9 % IV SOLN: INTRAVENOUS | Status: DC

## 2024-01-06 MED ORDER — PALONOSETRON HCL INJECTION 0.25 MG/5ML
0.2500 mg | Freq: Once | INTRAVENOUS | Status: AC
  Administered 2024-01-06: 0.25 mg via INTRAVENOUS
  Filled 2024-01-06: qty 5

## 2024-01-06 MED ORDER — SODIUM CHLORIDE 0.9 % IV SOLN
15.0000 mg/kg | Freq: Once | INTRAVENOUS | Status: AC
  Administered 2024-01-06: 1200 mg via INTRAVENOUS
  Filled 2024-01-06: qty 48

## 2024-01-06 MED ORDER — ALPRAZOLAM 0.25 MG PO TABS: 0.2500 mg | ORAL_TABLET | Freq: Once | ORAL | Status: DC

## 2024-01-06 MED ORDER — DEXAMETHASONE SOD PHOSPHATE PF 10 MG/ML IJ SOLN
10.0000 mg | Freq: Once | INTRAMUSCULAR | Status: AC
  Administered 2024-01-06: 10 mg via INTRAVENOUS

## 2024-01-06 MED ORDER — SODIUM CHLORIDE 0.9 % IV SOLN
438.5000 mg | Freq: Once | INTRAVENOUS | Status: AC
  Administered 2024-01-06: 440 mg via INTRAVENOUS
  Filled 2024-01-06: qty 44

## 2024-01-06 NOTE — Patient Instructions (Signed)
 [facility] - A DEPT OF [facility]  Discharge Instructions: Thank you for choosing [facility] to provide your oncology and hematology care.   If you have a lab appointment with the Cancer Center, please go directly to the Cancer Center and check in at the registration area.   Wear comfortable clothing and clothing appropriate for easy access to any Portacath or PICC line.   We strive to give you quality time with your provider. You may need to reschedule your appointment if you arrive late (15 or more minutes).  Arriving late affects you and other patients whose appointments are after yours.  Also, if you miss three or more appointments without notifying the office, you may be dismissed from the clinic at the provider's discretion.      For prescription refill requests, have your pharmacy contact our office and allow 72 hours for refills to be completed.    Today you received the following chemotherapy and/or immunotherapy agents Bevacizumab, Alimta, Carboplatin       To help prevent nausea and vomiting after your treatment, we encourage you to take your nausea medication as directed.  BELOW ARE SYMPTOMS THAT SHOULD BE REPORTED IMMEDIATELY: *FEVER GREATER THAN 100.4 F (38 C) OR HIGHER *CHILLS OR SWEATING *NAUSEA AND VOMITING THAT IS NOT CONTROLLED WITH YOUR NAUSEA MEDICATION *UNUSUAL SHORTNESS OF BREATH *UNUSUAL BRUISING OR BLEEDING *URINARY PROBLEMS (pain or burning when urinating, or frequent urination) *BOWEL PROBLEMS (unusual diarrhea, constipation, pain near the anus) TENDERNESS IN MOUTH AND THROAT WITH OR WITHOUT PRESENCE OF ULCERS (sore throat, sores in mouth, or a toothache) UNUSUAL RASH, SWELLING OR PAIN  UNUSUAL VAGINAL DISCHARGE OR ITCHING   Items with * indicate a potential emergency and should be followed up as soon as possible or go to the Emergency Department if any problems should occur.  Please show the CHEMOTHERAPY ALERT  CARD or IMMUNOTHERAPY ALERT CARD at check-in to the Emergency Department and triage nurse.  Should you have questions after your visit or need to cancel or reschedule your appointment, please contact [facility] - A DEPT OF [facility]  Dept: [phone]  and follow the prompts.  Office hours are 8:00 a.m. to 4:30 p.m. Monday - Friday. Please note that voicemails left after 4:00 p.m. may not be returned until the following business day.  We are closed weekends and major holidays. You have access to a nurse at all times for urgent questions. Please call the main number to the clinic Dept: [phone] and follow the prompts.   For any non-urgent questions, you may also contact your provider using MyChart. We now offer e-Visits for anyone 75 and older to request care online for non-urgent symptoms. For details visit [email or web address].   Also download the MyChart app! Go to the app store, search MyChart, open the app, select [place], and log in with your MyChart username and password.  Bevacizumab Injection What is this medication? BEVACIZUMAB (be va SIZ yoo mab) treats some types of cancer. It works by blocking a protein that causes cancer cells to grow and multiply. This helps to slow or stop the spread of cancer cells. It is a monoclonal antibody. This medicine may be used for other purposes; ask your health care provider or pharmacist if you have questions. COMMON BRAND NAME(S): Alymsys, Avastin, MVASI, Vegzalma, Zirabev What should I tell my care team before I take this medication? They need to know  if you have any of these conditions: Blood clots Coughing up blood Having or recent surgery Heart failure High blood pressure History of a connection between 2 or more body parts that do not usually connect (fistula) History of a tear in your stomach or intestines Protein in your urine An unusual or allergic reaction to bevacizumab, other medications,  foods, dyes, or preservatives Pregnant or trying to get pregnant Breast-feeding How should I use this medication? This medication is injected into a vein. It is given by your care team in a hospital or clinic setting. Talk to your care team the use of this medication in children. Special care may be needed. Overdosage: If you think you have taken too much of this medicine contact a poison control center or emergency room at once. NOTE: This medicine is only for you. Do not share this medicine with others. What if I miss a dose? Keep appointments for follow-up doses. It is important not to miss your dose. Call your care team if you are unable to keep an appointment. What may interact with this medication? Interactions are not expected. This list may not describe all possible interactions. Give your health care provider a list of all the medicines, herbs, non-prescription drugs, or dietary supplements you use. Also tell them if you smoke, drink alcohol, or use illegal drugs. Some items may interact with your medicine. What should I watch for while using this medication? Your condition will be monitored carefully while you are receiving this medication. You may need blood work while taking this medication. This medication may make you feel generally unwell. This is not uncommon as chemotherapy can affect healthy cells as well as cancer cells. Report any side effects. Continue your course of treatment even though you feel ill unless your care team tells you to stop. This medication may increase your risk to bruise or bleed. Call your care team if you notice any unusual bleeding. Before having surgery, talk to your care team to make sure it is ok. This medication can increase the risk of poor healing of your surgical site or wound. You will need to stop this medication for 28 days before surgery. After surgery, wait at least 28 days before restarting this medication. Make sure the surgical site or wound  is healed enough before restarting this medication. Talk to your care team if questions. Talk to your care team if you may be pregnant. Serious birth defects can occur if you take this medication during pregnancy and for 6 months after the last dose. Contraception is recommended while taking this medication and for 6 months after the last dose. Your care team can help you find the option that works for you. Do not breastfeed while taking this medication and for 6 months after the last dose. This medication can cause infertility. Talk to your care team if you are concerned about your fertility. What side effects may I notice from receiving this medication? Side effects that you should report to your care team as soon as possible: Allergic reactions--skin rash, itching, hives, swelling of the face, lips, tongue, or throat Bleeding--bloody or black, tar-like stools, vomiting blood or brown material that looks like coffee grounds, red or dark brown urine, small red or purple spots on skin, unusual bruising or bleeding Blood clot--pain, swelling, or warmth in the leg, shortness of breath, chest pain Heart attack--pain or tightness in the chest, shoulders, arms, or jaw, nausea, shortness of breath, cold or clammy skin, feeling faint or lightheaded  Heart failure--shortness of breath, swelling of the ankles, feet, or hands, sudden weight gain, unusual weakness or fatigue Increase in blood pressure Infection--fever, chills, cough, sore throat, wounds that don't heal, pain or trouble when passing urine, general feeling of discomfort or being unwell Infusion reactions--chest pain, shortness of breath or trouble breathing, feeling faint or lightheaded Kidney injury--decrease in the amount of urine, swelling of the ankles, hands, or feet Stomach pain that is severe, does not go away, or gets worse Stroke--sudden numbness or weakness of the face, arm, or leg, trouble speaking, confusion, trouble walking, loss of  balance or coordination, dizziness, severe headache, change in vision Sudden and severe headache, confusion, change in vision, seizures, which may be signs of posterior reversible encephalopathy syndrome (PRES) Side effects that usually do not require medical attention (report to your care team if they continue or are bothersome): Back pain Change in taste Diarrhea Dry skin Increased tears Nosebleed This list may not describe all possible side effects. Call your doctor for medical advice about side effects. You may report side effects to FDA at 1-800-FDA-1088. Where should I keep my medication? This medication is given in a hospital or clinic. It will not be stored at home. NOTE: This sheet is a summary. It may not cover all possible information. If you have questions about this medicine, talk to your doctor, pharmacist, or health care provider.  2024 Elsevier/Gold Standard (2021-07-05 00:00:00)

## 2024-01-06 NOTE — Progress Notes (Signed)
 Confirmed with Dr. Sherrod we are okay to proceed with bevacizumab today following dental work on 10/7.  Harlene Nasuti, PharmD Oncology Infusion Pharmacist 01/06/2024 9:21 AM

## 2024-01-06 NOTE — Progress Notes (Signed)
 [facility] [phone]   [phone]  OFFICE PROGRESS NOTE  [name] LABOR, MD [address] [place] [address]  DIAGNOSIS:   1) Metastatic non-small cell lung cancer, adenocarcinoma initially diagnosed as stage IIIa (T1c, N2, M0) non-small cell lung cancer, adenocarcinoma presented with right lower lobe lung nodule in addition to right paratracheal lymphadenopathy diagnosed in February 2025.  The patient has evidence for disease progression with bilateral pulmonary nodules in addition to bone metastasis in September 2025.  Molecular studies by [email or web address] showed no actionable mutations and negative PD-L1 expression.   2) history of rheumatoid arthritis and currently on treatment with Plaquenil .  PRIOR THERAPY:  1) A course of concurrent chemoradiation with weekly carboplatin  for AUC of 2 and paclitaxel  45 Mg/M2.  First dose May 04, 2023.  Status post 7 cycles.  Last dose was given on 06/15/2023. 2) Consolidation treatment with immunotherapy with Imfinzi  1500 Mg IV every 4 weeks status post 5 cycles.  Last dose was given 11/10/2023 discontinued secondary to disease progression.  CURRENT THERAPY:  1) Systemic chemotherapy with carboplatin  for AUC of 5, Alimta 500 mg/M2 and Avastin 15 MGs/KG every 3 weeks.  First dose 12/03/2023.  Status post 1 cycle. 2) Xgeva 120 mg subcutaneous every 3 months start January 27, 2024.  INTERVAL HISTORY: Wendy [name] 78 y.o. female returns to the clinic today for follow-up visit accompanied by her granddaughter. Discussed the use of AI scribe software for clinical note transcription with the patient, who gave verbal consent to proceed.  History of Present Illness Wendy [name] is a 78 year old female with metastatic non-small cell lung cancer who presents for evaluation before starting cycle two of systemic chemotherapy. She is accompanied by her granddaughter and has her daughter on the phone.  She  has a history of metastatic non-small cell lung cancer, adenocarcinoma, initially diagnosed as stage 3A in February 2025. She underwent concurrent chemoradiation followed by five cycles of consolidation immunotherapy with durvalumab , which was discontinued in September 2025 due to disease progression. She started systemic chemotherapy with carboplatin , Alimta, and Avastin on December 03, 2023, and is currently post one cycle.  She feels 'pretty good' with no significant complaints since the last visit. No nausea, vomiting, or diarrhea following the last chemotherapy cycle. She was not really tired and sleepy because she had received the B12 shot.  Her past medical history is significant for rheumatoid arthritis, for which she is taking Plaquenil .    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 Jon Jacob     ALLERGIES:  is allergic to aspirin and tramadol.  MEDICATIONS:  Current Outpatient Medications  Medication Sig  Dispense Refill   Accu-Chek Softclix Lancets lancets Test once per day and diagnosis code is E 11.9 and dispense for (ACCU-CHEK Soft Touch) 100 each 12   ALPRAZolam  (XANAX ) 1 MG tablet TAKE 1 TABLET (1 MG TOTAL) BY MOUTH AT BEDTIME AS NEEDED FOR ANXIETY OR SLEEP. 30 tablet 5   amLODipine  (NORVASC ) 10 MG tablet Take 1 tablet (10 mg total) by mouth daily. 90  tablet 3   atorvastatin  (LIPITOR) 10 MG tablet TAKE 1 TABLET BY MOUTH EVERY DAY 90 tablet 3   Blood Glucose Monitoring Suppl (ACCU-CHEK GUIDE) w/Device KIT Use to test sugars daily. 1 kit 0   Blood Glucose Monitoring Suppl DEVI 1 each by Does not apply route in the morning, at noon, and at bedtime. May substitute to any manufacturer covered by patient's insurance. 1 each 0   Blood Pressure Monitor KIT Use to check blood pressure. 1 kit 0   Cholecalciferol (VITAMIN D3) 20 MCG (800 UNIT) TABS Take 800 Units by mouth daily.     dexamethasone  (DECADRON ) 4 MG tablet Take 1 tab by mouth 2 times daily starting day before pemetrexed. Then take 2 tabs daily x 3 days starting day after chemo. Take with food. 30 tablet 1   DULoxetine  (CYMBALTA ) 30 MG capsule Take 1 capsule (30 mg total) by mouth at bedtime.     fluticasone  (FLONASE ) 50 MCG/ACT nasal spray SPRAY 2 SPRAYS INTO EACH NOSTRIL EVERY DAY 48 mL 3   folic acid  (FOLVITE ) 1 MG tablet Take 1 tablet (1 mg total) by mouth daily. Start 7 days before pemetrexed chemotherapy. Continue until 21 days after pemetrexed completed. 100 tablet 3   furosemide  (LASIX ) 20 MG tablet Take 1 tablet (20 mg total) by mouth daily as needed (fluid retention). 90 tablet 3   Ginkgo Biloba (GNP GINGKO BILOBA EXTRACT PO) Take by mouth.     glipiZIDE  (GLUCOTROL ) 5 MG tablet Take 1 tablet (5 mg total) by mouth 2 (two) times daily before a meal. 180 tablet 3   glucose blood (ACCU-CHEK GUIDE TEST) test strip Use as instructed 100 strip 1   hydroxychloroquine  (PLAQUENIL ) 200 MG tablet Take 1 tablet (200 mg total) by mouth daily. 90 tablet 1   insulin  degludec (TRESIBA  FLEXTOUCH) 100 UNIT/ML FlexTouch Pen Inject 20 Units into the skin daily. 15 mL 4   Insulin  Pen Needle 32G X 4 MM MISC Use 4x a day 300 each 3   Lancets (ACCU-CHEK SOFT TOUCH) lancets Test once per day and diagnosis code is E 11.9 and dispense for Aviva plus 100 each 3   levocetirizine (XYZAL ) 5 MG tablet TAKE 1 TABLET  BY MOUTH EVERY DAY IN THE EVENING 90 tablet 3   levocetirizine (XYZAL ) 5 MG tablet Take 1 tablet (5 mg total) by mouth every evening. 90 tablet 3   lidocaine -prilocaine  (EMLA ) cream Apply to affected area once 30 g 3   metoCLOPramide  (REGLAN ) 10 MG tablet Take 1 tablet (10 mg total) by mouth 3 (three) times daily with meals for 5 days. 15 tablet 0   mupirocin  ointment (BACTROBAN ) 2 % Apply 1 Application topically 2 (two) times daily. 22 g 5   omeprazole  (PRILOSEC) 40 MG capsule TAKE 1 CAPSULE BY MOUTH EVERY DAY 90 capsule 3   ondansetron  (ZOFRAN ) 4 MG tablet Take 1 tablet (4 mg total) by mouth every 8 (eight) hours as needed for nausea or vomiting. 90 tablet 3   ondansetron  (ZOFRAN ) 8 MG tablet Take 1 tablet (8 mg total) by mouth every  8 (eight) hours as needed for nausea or vomiting. Start on the third day after chemotherapy. 30 tablet 1   oxyCODONE -acetaminophen  (PERCOCET) 10-325 MG tablet Take 1 tablet by mouth every 6 (six) hours as needed for pain. 120 tablet 0   oxyCODONE -acetaminophen  (PERCOCET) 10-325 MG tablet Take 1 tablet by mouth every 6 (six) hours as needed for pain. 120 tablet 0   oxyCODONE -acetaminophen  (PERCOCET) 10-325 MG tablet Take 1 tablet by mouth every 6 (six) hours as needed for pain. 120 tablet 0   polyethylene glycol (MIRALAX  / GLYCOLAX ) 17 g packet Take 17 g by mouth daily as needed for mild constipation.     potassium chloride  (KLOR-CON ) 10 MEQ tablet TAKE 1 TABLET BY MOUTH EVERY DAY 90 tablet 3   pregabalin  (LYRICA ) 100 MG capsule Take 1 capsule (100 mg total) by mouth at bedtime. 90 capsule 1   prochlorperazine  (COMPAZINE ) 10 MG tablet Take 1 tablet (10 mg total) by mouth every 6 (six) hours as needed for nausea or vomiting. 30 tablet 1   RESTASIS  0.05 % ophthalmic emulsion Place 1 drop into both eyes 2 (two) times daily.     sitaGLIPtin  (JANUVIA ) 100 MG tablet Take 1 tablet (100 mg total) by mouth at bedtime. 90 tablet 3   sucralfate  (CARAFATE ) 1 g tablet Take 1 tablet  (1 g total) by mouth 4 (four) times daily. Dissolve each tablet in 15 cc water before use. 120 tablet 2   No current facility-administered medications for this visit.    SURGICAL HISTORY:  Past Surgical History:  Procedure Laterality Date   BRONCHIAL BRUSHINGS  04/07/2023   Procedure: BRONCHIAL BRUSHINGS;  Surgeon: Shelah Lamar RAMAN, MD;  Location: [facility] ENDOSCOPY;  Service: Pulmonary;;   BRONCHIAL NEEDLE ASPIRATION BIOPSY  04/07/2023   Procedure: BRONCHIAL NEEDLE ASPIRATION BIOPSIES;  Surgeon: Shelah Lamar RAMAN, MD;  Location: [facility] ENDOSCOPY;  Service: Pulmonary;;   CATARACT EXTRACTION     COLONOSCOPY  11/23/2019   per Dr. Teressa, adenmatous polyp, repeat in 7 yrs   ESOPHAGOGASTRODUODENOSCOPY  06/20/2008   at [facility], clear    FINE NEEDLE ASPIRATION  04/07/2023   Procedure: FINE NEEDLE ASPIRATION (FNA) LINEAR;  Surgeon: Shelah Lamar RAMAN, MD;  Location: [facility] ENDOSCOPY;  Service: Pulmonary;;   IR IMAGING GUIDED PORT INSERTION  04/30/2023   LUMBAR LAMINECTOMY     1995   ROTATOR CUFF REPAIR Right    SPINAL FUSION N/A 07/29/2022   Procedure: T10-PELVIS INSTRUMENTATION/ T10-L4 POSTERIOR SPINAL FUSION;  Surgeon: Georgina Ozell LABOR, MD;  Location: [facility] OR;  Service: Orthopedics;  Laterality: N/A;   UPPER GASTROINTESTINAL ENDOSCOPY  2010   [facility]    VIDEO BRONCHOSCOPY WITH ENDOBRONCHIAL ULTRASOUND N/A 04/07/2023   Procedure: VIDEO BRONCHOSCOPY WITH ENDOBRONCHIAL ULTRASOUND;  Surgeon: Shelah Lamar RAMAN, MD;  Location: [facility] ENDOSCOPY;  Service: Pulmonary;  Laterality: N/A;    REVIEW OF SYSTEMS:  Constitutional: positive for fatigue Eyes: negative Ears, nose, mouth, throat, and face: negative Respiratory: positive for dyspnea on exertion Cardiovascular: negative Gastrointestinal: negative Genitourinary:negative Integument/breast: negative Hematologic/lymphatic: negative Musculoskeletal:positive for arthralgias and back pain Neurological: negative Behavioral/Psych: negative Endocrine:  negative Allergic/Immunologic: negative   PHYSICAL EXAMINATION: General appearance: alert, cooperative, fatigued, and no distress Head: Normocephalic, without obvious abnormality, atraumatic Neck: no adenopathy, no JVD, supple, symmetrical, trachea midline, and thyroid  not enlarged, symmetric, no tenderness/mass/nodules Lymph nodes: Cervical, supraclavicular, and axillary nodes normal. Resp: clear to auscultation bilaterally Back: symmetric, no curvature. ROM normal. No CVA tenderness. Cardio: regular rate and rhythm, S1, S2 normal, no murmur,  click, rub or gallop GI: soft, non-tender; bowel sounds normal; no masses,  no organomegaly Extremities: extremities normal, atraumatic, no cyanosis or edema Neurologic: Alert and oriented X 3, normal strength and tone. Normal symmetric reflexes. Normal coordination and gait  ECOG PERFORMANCE STATUS: 1 - Symptomatic but completely ambulatory  Blood pressure 124/68, pulse 82, temperature 98 F (36.7 C), temperature source Temporal, resp. rate 17, height 5' 3 (1.6 m), weight 186 lb (84.4 kg), SpO2 100%.  LABORATORY DATA: Lab Results  Component Value Date   WBC 4.6 12/16/2023   HGB 10.7 (L) 12/16/2023   HCT 34.6 (L) 12/16/2023   MCV 77.6 (L) 12/16/2023   PLT 218 12/16/2023      Chemistry      Component Value Date/Time   NA 138 12/16/2023 1150   K 3.7 12/16/2023 1150   CL 105 12/16/2023 1150   CO2 28 12/16/2023 1150   BUN 11 12/16/2023 1150   CREATININE 0.79 12/16/2023 1150   CREATININE 1.07 (H) 09/16/2019 1511      Component Value Date/Time   CALCIUM  9.2 12/16/2023 1150   ALKPHOS 88 12/16/2023 1150   AST 13 (L) 12/16/2023 1150   ALT 9 12/16/2023 1150   BILITOT 0.7 12/16/2023 1150       RADIOGRAPHIC STUDIES: No results found.     ASSESSMENT AND PLAN: This is a very pleasant 78 years old African-American female with Stage IIIa (T1c, N2, M0) non-small cell lung cancer, adenocarcinoma presented with right lower lobe lung nodule  in addition to right paratracheal lymphadenopathy diagnosed in February 2025. Molecular studies by [email or web address] showed no actionable mutations and negative PD-L1 expression. She also has a history of rheumatoid arthritis and currently on treatment with Plaquenil . She underwent a course of concurrent chemoradiation with weekly carboplatin  for AUC of 2 and paclitaxel  45 Mg/M2.  First dose May 04, 2023.  States post 7 cycles.  Last cycle was given on 06/15/2023. She is currently undergoing consolidation treatment with immunotherapy with Imfinzi  1500 Mg IV every 4 weeks status post 3 cycles. She had repeat CT scan of the chest performed recently. Her scan showed improvement in the right lower lobe lung mass but there was a lytic lesion at the T8 vertebrae.  She had a PET scan performed recently.  I personally and independently reviewed the PET scan images and discussed the result and showed the images to the patient today.  Her PET scan showed the right lower lobe malignancy with the expected postradiation changes and mild FDG uptake but there was new nodular FDG avid lesions in bilateral lower lobes consistent with metastasis as well as interval increase in pleural effusion.  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 She is currently undergoing systemic chemotherapy with carboplatin  for AUC of 5, Alimta 500 mg/M2 and Avastin 15 MGs/KG every 3 weeks status post 1 cycle. She tolerated the first cycle of her treatment fairly well. She will also start treatment with Xgeva 120 mg subcutaneously every 3 months starting January 27, 2024 for the bone disease. Assessment and Plan Assessment & Plan Metastatic non-small cell lung cancer, adenocarcinoma Initially diagnosed as stage 3A in February 2025. Post concurrent chemoradiation and five cycles of durvalumab , discontinued in September 2025 due to disease progression. Currently on systemic chemotherapy with carboplatin ,  Alimta, and Avastin, post one cycle. No significant side effects reported. Discussion on the ineffectiveness of Keytruda due to previous immune therapy failure and potential exacerbation of rheumatoid arthritis. - Continue systemic  chemotherapy with carboplatin , Alimta, and Avastin. - Administered Avastin injection. - Administered bone-strengthening injection. - Will schedule next cycle of chemotherapy in three weeks. - Will repeat scan after three cycles of treatment.  Rheumatoid arthritis Managed with Plaquenil . Immune therapy can exacerbate rheumatoid arthritis, hence avoided. - Continue Plaquenil  for rheumatoid arthritis management. She was advised to call immediately if she has any other concerning symptoms in the interval.   The patient voices understanding of current disease status and treatment options and is in agreement with the current care plan.  All questions were answered. The patient knows to call the clinic with any problems, questions or concerns. We can certainly see the patient much sooner if necessary.  The total time spent in the appointment was 55 minutes.  Disclaimer: This note was dictated with voice recognition software. Similar sounding words can inadvertently be transcribed and may not be corrected upon review.

## 2024-01-07 ENCOUNTER — Telehealth: Payer: Self-pay

## 2024-01-07 ENCOUNTER — Ambulatory Visit

## 2024-01-07 ENCOUNTER — Other Ambulatory Visit

## 2024-01-07 ENCOUNTER — Other Ambulatory Visit: Payer: Self-pay | Admitting: Endocrinology

## 2024-01-07 ENCOUNTER — Ambulatory Visit: Admitting: Internal Medicine

## 2024-01-07 DIAGNOSIS — E1165 Type 2 diabetes mellitus with hyperglycemia: Secondary | ICD-10-CM

## 2024-01-07 MED ORDER — NOVOLOG FLEXPEN 100 UNIT/ML ~~LOC~~ SOPN: PEN_INJECTOR | SUBCUTANEOUS | 4 refills | Status: DC

## 2024-01-07 NOTE — Telephone Encounter (Signed)
 Patient left message about new medication is increasing her blood glucose levels. Message sent to patient for further details.

## 2024-01-07 NOTE — Telephone Encounter (Signed)
-----   Message from Nurse Dawna HERO sent at 01/06/2024 12:33 PM EST ----- Regarding: Dr. Sherrod 1st tx f/u call Dr. Sherrod 1st tx f/u call. Bev added to treatment - tolerated well

## 2024-01-07 NOTE — Progress Notes (Unsigned)
 Start NovoLog  sliding scale for correcting hyperglycemia [place] into blood sugar before eating as follows take up to 3 times a day before meals.  Sliding Scale Blood Glucose        Insulin  60-150                     None 151-200                   3 units 201-250                   5 units 251-300                   7 units 301-350                   9 units 351-400                  11 units    >400                       12 units and call provider  Prescription sent.

## 2024-01-08 NOTE — Progress Notes (Signed)
 Patient called, SSI explained. Patient states understanding. Patient to call office with any other questions or concerns

## 2024-01-11 ENCOUNTER — Telehealth: Payer: Self-pay | Admitting: Nutrition

## 2024-01-11 ENCOUNTER — Other Ambulatory Visit: Payer: Self-pay | Admitting: Radiation Oncology

## 2024-01-11 NOTE — Telephone Encounter (Signed)
 Patient called very confused about the 2 kinds of insulin  she got at the drug store, how much and when to take this.   She was told to take the Lantus  as directed (written on the box to take 25u once a day) at bedtime-9PM She was then told to take her blood sugar before each meal-3X/day and to take the sliding scale insulin .  Both she and her son wrote the sliding scale down as I told it to them.  Her blood sugar was now 311 and they reported that they will need to take 9u.  I told them that this was correct.  They reported good understanding and had no final questions.

## 2024-01-12 ENCOUNTER — Encounter: Payer: Self-pay | Admitting: Internal Medicine

## 2024-01-14 ENCOUNTER — Ambulatory Visit: Admitting: Orthopedic Surgery

## 2024-01-15 ENCOUNTER — Other Ambulatory Visit: Payer: Self-pay

## 2024-01-17 ENCOUNTER — Other Ambulatory Visit: Payer: Self-pay

## 2024-01-18 ENCOUNTER — Other Ambulatory Visit: Payer: Self-pay

## 2024-01-22 ENCOUNTER — Encounter: Payer: Self-pay | Admitting: Radiation Oncology

## 2024-01-22 NOTE — Progress Notes (Unsigned)
 [facility] OFFICE PROGRESS NOTE  Wendy [name] LABOR, MD [address] [place] [address]  DIAGNOSIS:  1) Metastatic non-small cell lung cancer, adenocarcinoma initially diagnosed as stage IIIa (T1c, N2, M0) non-small cell lung cancer, adenocarcinoma presented with right lower lobe lung nodule in addition to right paratracheal lymphadenopathy diagnosed in February 2025.  The patient has evidence for disease progression with bilateral pulmonary nodules in addition to bone metastasis in September 2025.   Molecular studies by [email or web address] showed no actionable mutations and negative PD-L1 expression.   2) history of rheumatoid arthritis and currently on treatment with Plaquenil .  PRIOR THERAPY:  1) A course of concurrent chemoradiation with weekly carboplatin  for AUC of 2 and paclitaxel  45 Mg/M2.  First dose May 04, 2023.  Status post 7 cycles.  Last dose was given on 06/15/2023. 2) Consolidation treatment with immunotherapy with Imfinzi  1500 Mg IV every 4 weeks status post 5 cycles.  Last dose was given 11/10/2023 discontinued secondary to disease progression.  CURRENT THERAPY: 1) Systemic chemotherapy with carboplatin  for AUC of 5, Alimta  500 mg/M2 and Avastin  15 MGs/KG every 3 weeks.  First dose 12/03/2023.  Status post 2 cycles. 2) Xgeva  120 mg subcutaneous every 3 months start January 27, 2024.  INTERVAL HISTORY: Wendy [name] 78 y.o. female returns visit for follow-up visit.  The patient was last seen by Dr. Sherrod in 01/06/2024.   The patient was found to have disease progression recently.  Therefore she is on chemotherapy.  She is status post 2 cycles and has tolerated it ***,.  She denies any fever, chills, night sweats, or unexplained weight loss.  Her breathing is ***.  She denies any chest pain, shortness of breath, cough, or mops this.  Denies any nausea, vomiting, diarrhea, or constipation.  Denies any headache or visual changes.  Denies any abnormal bleeding  or bruising.  She takes Plaquenil  for rheumatoid arthritis.  She is here today for evaluation and repeat blood work before undergoing cycle #3.  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 Jon Jacob     ALLERGIES:  is allergic to aspirin and tramadol.  MEDICATIONS:  Current Outpatient Medications  Medication Sig Dispense Refill   Accu-Chek Softclix Lancets lancets Test once per day and diagnosis code is E 11.9 and dispense for (ACCU-CHEK Soft Touch) 100 each 12   ALPRAZolam  (XANAX ) 1 MG tablet TAKE 1 TABLET (1 MG TOTAL) BY MOUTH AT BEDTIME AS NEEDED FOR ANXIETY OR SLEEP. 30 tablet 5   amLODipine  (NORVASC ) 10 MG tablet Take 1 tablet (10 mg total) by mouth daily. 90 tablet 3   atorvastatin  (LIPITOR) 10 MG tablet TAKE 1 TABLET BY MOUTH EVERY DAY  90 tablet 3   Blood Glucose Monitoring Suppl (ACCU-CHEK GUIDE) w/Device KIT Use to test sugars daily. 1 kit 0   Blood Glucose Monitoring Suppl DEVI 1 each by Does not apply route in the morning, at noon, and at bedtime. May substitute to any manufacturer covered by patient's insurance. 1 each 0   Blood Pressure Monitor KIT Use to check blood pressure. 1 kit 0   Cholecalciferol (VITAMIN D3) 20 MCG (800 UNIT) TABS Take 800 Units by mouth daily.     dexamethasone  (DECADRON ) 4 MG tablet Take 1 tab by mouth 2 times daily starting day  before pemetrexed . Then take 2 tabs daily x 3 days starting day after chemo. Take with food. 30 tablet 1   DULoxetine  (CYMBALTA ) 30 MG capsule Take 1 capsule (30 mg total) by mouth at bedtime.     fluticasone  (FLONASE ) 50 MCG/ACT nasal spray SPRAY 2 SPRAYS INTO EACH NOSTRIL EVERY DAY 48 mL 3   folic acid  (FOLVITE ) 1 MG tablet Take 1 tablet (1 mg total) by mouth daily. Start 7 days before pemetrexed  chemotherapy. Continue until 21 days after pemetrexed  completed. 100 tablet 3   furosemide  (LASIX ) 20 MG tablet Take 1 tablet (20 mg total) by mouth daily as needed (fluid retention). 90 tablet 3   Ginkgo Biloba (GNP GINGKO BILOBA EXTRACT PO) Take by mouth.     glipiZIDE  (GLUCOTROL ) 5 MG tablet Take 1 tablet (5 mg total) by mouth 2 (two) times daily before a meal. 180 tablet 3   glucose blood (ACCU-CHEK GUIDE TEST) test strip Use as instructed 100 strip 1   hydroxychloroquine  (PLAQUENIL ) 200 MG tablet Take 1 tablet (200 mg total) by mouth daily. 90 tablet 1   insulin  aspart (NOVOLOG  FLEXPEN) 100 UNIT/ML FlexPen Take according following sliding scale: Sliding Scale for glucose before eating. Maximum 36 units/day. Blood Glucose        Insulin  60-150                     None 151-200                   3 units 201-250                   5 units 251-300                   7 units 301-350                   9 units 351-400                  11 units   >400                       12 units and call provider 15 mL 4   insulin  degludec (TRESIBA  FLEXTOUCH) 100 UNIT/ML FlexTouch Pen Inject 20 Units into the skin daily. 15 mL 4   Insulin  Pen Needle 32G X 4 MM MISC Use 4x a day 300 each 3   Lancets (ACCU-CHEK SOFT TOUCH) lancets Test once per day and diagnosis code is E 11.9 and dispense for Aviva plus 100 each 3   levocetirizine (XYZAL ) 5 MG tablet TAKE 1 TABLET BY MOUTH EVERY DAY IN THE EVENING 90 tablet 3   levocetirizine (XYZAL ) 5 MG tablet Take 1 tablet (5 mg total) by mouth every evening. 90 tablet 3    lidocaine -prilocaine  (EMLA ) cream Apply to affected area once 30 g 3  metoCLOPramide  (REGLAN ) 10 MG tablet Take 1 tablet (10 mg total) by mouth 3 (three) times daily with meals for 5 days. 15 tablet 0   mupirocin  ointment (BACTROBAN ) 2 % Apply 1 Application topically 2 (two) times daily. 22 g 5   omeprazole  (PRILOSEC) 40 MG capsule TAKE 1 CAPSULE BY MOUTH EVERY DAY 90 capsule 3   ondansetron  (ZOFRAN ) 4 MG tablet Take 1 tablet (4 mg total) by mouth every 8 (eight) hours as needed for nausea or vomiting. 90 tablet 3   ondansetron  (ZOFRAN ) 8 MG tablet Take 1 tablet (8 mg total) by mouth every 8 (eight) hours as needed for nausea or vomiting. Start on the third day after chemotherapy. 30 tablet 1   oxyCODONE -acetaminophen  (PERCOCET) 10-325 MG tablet Take 1 tablet by mouth every 6 (six) hours as needed for pain. 120 tablet 0   oxyCODONE -acetaminophen  (PERCOCET) 10-325 MG tablet Take 1 tablet by mouth every 6 (six) hours as needed for pain. 120 tablet 0   oxyCODONE -acetaminophen  (PERCOCET) 10-325 MG tablet Take 1 tablet by mouth every 6 (six) hours as needed for pain. 120 tablet 0   polyethylene glycol (MIRALAX  / GLYCOLAX ) 17 g packet Take 17 g by mouth daily as needed for mild constipation.     potassium chloride  (KLOR-CON ) 10 MEQ tablet TAKE 1 TABLET BY MOUTH EVERY DAY 90 tablet 3   pregabalin  (LYRICA ) 100 MG capsule Take 1 capsule (100 mg total) by mouth at bedtime. 90 capsule 1   prochlorperazine  (COMPAZINE ) 10 MG tablet Take 1 tablet (10 mg total) by mouth every 6 (six) hours as needed for nausea or vomiting. 30 tablet 1   RESTASIS  0.05 % ophthalmic emulsion Place 1 drop into both eyes 2 (two) times daily.     sitaGLIPtin  (JANUVIA ) 100 MG tablet Take 1 tablet (100 mg total) by mouth at bedtime. 90 tablet 3   sucralfate  (CARAFATE ) 1 g tablet Take 1 tablet (1 g total) by mouth 4 (four) times daily. Dissolve each tablet in 15 cc water before use. 120 tablet 2   No current facility-administered  medications for this visit.    SURGICAL HISTORY:  Past Surgical History:  Procedure Laterality Date   BRONCHIAL BRUSHINGS  04/07/2023   Procedure: BRONCHIAL BRUSHINGS;  Surgeon: Shelah Lamar RAMAN, MD;  Location: [facility] ENDOSCOPY;  Service: Pulmonary;;   BRONCHIAL NEEDLE ASPIRATION BIOPSY  04/07/2023   Procedure: BRONCHIAL NEEDLE ASPIRATION BIOPSIES;  Surgeon: Shelah Lamar RAMAN, MD;  Location: [facility] ENDOSCOPY;  Service: Pulmonary;;   CATARACT EXTRACTION     COLONOSCOPY  11/23/2019   per Dr. Teressa, adenmatous polyp, repeat in 7 yrs   ESOPHAGOGASTRODUODENOSCOPY  06/20/2008   at [facility], clear    FINE NEEDLE ASPIRATION  04/07/2023   Procedure: FINE NEEDLE ASPIRATION (FNA) LINEAR;  Surgeon: Shelah Lamar RAMAN, MD;  Location[facility] END[facility]COPY;  Service: Pulmonary;;   IR IMAGING GUIDED PORT INSERTION  04/30/2023   LUMBAR LAMINECTOMY     1995   ROTATOR CUFF REPAIR Right    SPINAL FUSION N/A 07/29/2022   Procedure: T10-PELVIS INSTRUMENTATION/ T10-L4 POSTERIOR SPINAL FUSION;  Surgeon: Georgina Ozell LABOR, MD;  Location: [facility] OR;  Service: Orthopedics;  Laterality: N/A;   UPPER GASTROINTESTINAL ENDOSCOPY  2010   [facility]    VIDEO BRONCHOSCOPY WITH ENDOBRONCHIAL ULTRASOUND N/A 04/07/2023   Procedure: VIDEO BRONCHOSCOPY WITH ENDOBRONCHIAL ULTRASOUND;  Surgeon: Shelah Lamar RAMAN, MD;  Location: [facility] ENDOSCOPY;  Service: Pulmonary;  Laterality: N/A;    REVIEW OF SYSTEMS:   Review of Systems  Constitutional: Negative for appetite change, chills, fatigue, fever and unexpected weight change.  HENT:   Negative for mouth sores, nosebleeds, sore throat and trouble swallowing.   Eyes: Negative for eye problems and icterus.  Respiratory: Negative for cough, hemoptysis, shortness of breath and wheezing.   Cardiovascular: Negative for chest pain and leg swelling.  Gastrointestinal: Negative for abdominal pain, constipation, diarrhea, nausea and vomiting.  Genitourinary: Negative for bladder incontinence, difficulty urinating,  dysuria, frequency and hematuria.   Musculoskeletal: Negative for back pain, gait problem, neck pain and neck stiffness.  Skin: Negative for itching and rash.  Neurological: Negative for dizziness, extremity weakness, gait problem, headaches, light-headedness and seizures.  Hematological: Negative for adenopathy. Does not bruise/bleed easily.  Psychiatric/Behavioral: Negative for confusion, depression and sleep disturbance. The patient is not nervous/anxious.     PHYSICAL EXAMINATION:  There were no vitals taken for this visit.  ECOG PERFORMANCE STATUS: {CHL ONC ECOG [id number]  Physical Exam  Constitutional: Oriented to person, place, and time and well-developed, well-nourished, and in no distress. No distress.  HENT:  Head: Normocephalic and atraumatic.  Mouth/Throat: Oropharynx is clear and moist. No oropharyngeal exudate.  Eyes: Conjunctivae are normal. Right eye exhibits no discharge. Left eye exhibits no discharge. No scleral icterus.  Neck: Normal range of motion. Neck supple.  Cardiovascular: Normal rate, regular rhythm, normal heart sounds and intact distal pulses.   Pulmonary/Chest: Effort normal and breath sounds normal. No respiratory distress. No wheezes. No rales.  Abdominal: Soft. Bowel sounds are normal. Exhibits no distension and no mass. There is no tenderness.  Musculoskeletal: Normal range of motion. Exhibits no edema.  Lymphadenopathy:    No cervical adenopathy.  Neurological: Alert and oriented to person, place, and time. Exhibits normal muscle tone. Gait normal. Coordination normal.  Skin: Skin is warm and dry. No rash noted. Not diaphoretic. No erythema. No pallor.  Psychiatric: Mood, memory and judgment normal.  Vitals reviewed.  LABORATORY DATA: Lab Results  Component Value Date   WBC 3.4 (L) 01/06/2024   HGB 10.2 (L) 01/06/2024   HCT 32.9 (L) 01/06/2024   MCV 77.2 (L) 01/06/2024   PLT 201 01/06/2024      Chemistry      Component Value  Date/Time   NA 138 01/06/2024 0758   K 4.3 01/06/2024 0758   CL 106 01/06/2024 0758   CO2 27 01/06/2024 0758   BUN 14 01/06/2024 0758   CREATININE 0.90 01/06/2024 0758   CREATININE 1.07 (H) 09/16/2019 1511      Component Value Date/Time   CALCIUM  9.0 01/06/2024 0758   ALKPHOS 87 01/06/2024 0758   AST 11 (L) 01/06/2024 0758   ALT 11 01/06/2024 0758   BILITOT 0.5 01/06/2024 0758       RADIOGRAPHIC STUDIES:  No results found.   ASSESSMENT/PLAN:  This is a very pleasant 78 year old African-American female with metastatic lung cancer, initially diagnosed as stage IIIa (T1c, N2, M0) non-small cell lung cancer, adenocarcinoma. She presented with a right lower lobe lung nodule in addition to right paratracheal lymphadenopathy. She was diagnosed in February 2025. Her molecular studies by [email or web address] showed no actionable mutations and a negative PD-L1 expression.   The patient has a history of rheumatoid arthritis and is currently on treatment with Plaquenil .   The patient underwent a course of concurrent chemoradiation with weekly carboplatin  for an AUC of 2 and paclitaxel  45 mg/m.  The patient's first dose was on 05/04/2023.  She is status post 7 cycles of treatment.  The last  dose was on 06/15/2023.   She then underwent consolidation treatment with immunotherapy with Imfinzi  1500 mg IV every 4 weeks. She was status post 5 cycles.   She was found to have disease progression with  new nodular FDG avid lesions in bilateral lower lobes consistent with metastasis as well as interval increase in pleural effusion. 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   Therefore, Dr. Sherrod started him on chemotherapy with carboplatin  for an AUC of 5, Alimta  500 mg/m, and Avastin  1500 mg/kg IV every 3 weeks.  She is status post 2 cycles.  She also receives Xgeva  every 3 months starting 01/27/2024  Labs were reviewed.  Recommend that she ***with cycle #3 today  scheduled.  I will arrange for restaging CT scan of the chest, abdomen, and pelvis prior to the next cycle of treatment.  Patient has rheumatoid arthritis which she has managed with Plaquenil .  The patient was advised to call immediately if she has any concerning symptoms in the interval. The patient voices understanding of current disease status and treatment options and is in agreement with the current care plan. All questions were answered. The patient knows to call the clinic with any problems, questions or concerns. We can certainly see the patient much sooner if necessary   No orders of the defined types were placed in this encounter.    I spent {CHL ONC TIME VISIT - DTPQU:[id number]} counseling the patient face to face. The total time spent in the appointment was {CHL ONC TIME VISIT - DTPQU:[id number]}.  Lavell Ridings L Amel Gianino, PA-C 01/22/24

## 2024-01-24 ENCOUNTER — Other Ambulatory Visit: Payer: Self-pay

## 2024-01-24 NOTE — Progress Notes (Signed)
 Radiation Oncology         [phone] ________________________________  Name: Wendy [name] MRN: [id number]  Date: 01/25/2024  DOB: [birth year]  Follow-Up Visit Note  CC: [name] LABOR, MD  [name] LABOR, MD  No diagnosis found.  Diagnosis: The primary encounter diagnosis was Metastasis to bone [facility]). 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   Metastatic non-small cell lung cancer, adenocarcinoma initially diagnosed as stage IIIa (T1c, N2, M0) non-small cell lung cancer with evidence for disease progression with bilateral pulmonary nodules in addition to bone metastasis in September 2025   Interval Since Last Radiation: 1 month   2) Intent: Palliative  Radiation Treatment Dates: First Treatment Date: 2023-12-14 -- Last Treatment Date: 2023-12-25 Site/Dose/Technique/Mode:  Plan Name: Spine_T Site: Thoracic Spine Technique: 3D Mode: Photon Dose Per Fraction: 2 Gy Prescribed Dose (Delivered / Prescribed): 20 Gy / 20 Gy Prescribed Fxs (Delivered / Prescribed): 10 / 10  1)  Radiation Treatment Dates: First Treatment Date: 2023-05-04 -- Last Treatment Date: 2023-06-17 Site/Dose/Technique/Mode:  Plan Name: Lung_R Site: Lung, Right Technique: 3D Mode: Photon Dose Per Fraction: 2 Gy Prescribed Dose (Delivered / Prescribed): 60 Gy / 60 Gy Prescribed Fxs (Delivered / Prescribed): 30 / 30   Plan Name: Lung_R_Bst Site: Lung, Right Technique: 3D Mode: Photon Dose Per Fraction: 2 Gy Prescribed Dose (Delivered / Prescribed): 6 Gy / 6 Gy Prescribed Fxs (Delivered / Prescribed): 3 / 3  Narrative:  The patient returns today for a routine one month follow-up visit after completing radiation therapy to the thoracic spine. She tolerated radiation therapy relatively well overall other than fatigue and minimal esophageal symptoms. She also noted improvement in her back pain throughout her treatment.      While  undergoing radiation therapy, she received her first dose of chemotherapy with Alimta , Carboplatin , and Avastin  on 12/16/23 under the care of Dr. Sherrod. She tolerated her first cycle of chemotherapy well without any significant side effects and most recently received her second cycle of therapy on 01/06/24. She is scheduled to receive her 3rd cycle of chemotherapy this week.   No other significant interval history since the patient completed radiation therapy.   ***                       Allergies:  is allergic to aspirin and tramadol.  Meds: Current Outpatient Medications  Medication Sig Dispense Refill   Accu-Chek Softclix Lancets lancets Test once per day and diagnosis code is E 11.9 and dispense for (ACCU-CHEK Soft Touch) 100 each 12   ALPRAZolam  (XANAX ) 1 MG tablet TAKE 1 TABLET (1 MG TOTAL) BY MOUTH AT BEDTIME AS NEEDED FOR ANXIETY OR SLEEP. 30 tablet 5   amLODipine  (NORVASC ) 10 MG tablet Take 1 tablet (10 mg total) by mouth daily. 90 tablet 3   atorvastatin  (LIPITOR) 10 MG tablet TAKE 1 TABLET BY MOUTH EVERY DAY 90 tablet 3   Blood Glucose Monitoring Suppl (ACCU-CHEK GUIDE) w/Device KIT Use to test sugars daily. 1 kit 0   Blood Glucose Monitoring Suppl DEVI 1 each by Does not apply route in the morning, at noon, and at bedtime. May substitute to any manufacturer covered by patient's insurance. 1 each 0   Blood Pressure Monitor KIT Use to check blood pressure. 1 kit 0   Cholecalciferol (VITAMIN D3) 20 MCG (800 UNIT) TABS Take 800 Units by mouth daily.     dexamethasone  (  DECADRON ) 4 MG tablet Take 1 tab by mouth 2 times daily starting day before pemetrexed . Then take 2 tabs daily x 3 days starting day after chemo. Take with food. 30 tablet 1   DULoxetine  (CYMBALTA ) 30 MG capsule Take 1 capsule (30 mg total) by mouth at bedtime.     fluticasone  (FLONASE ) 50 MCG/ACT nasal spray SPRAY 2 SPRAYS INTO EACH NOSTRIL EVERY DAY 48 mL 3   folic acid  (FOLVITE ) 1 MG tablet Take 1 tablet (1 mg total)  by mouth daily. Start 7 days before pemetrexed  chemotherapy. Continue until 21 days after pemetrexed  completed. 100 tablet 3   furosemide  (LASIX ) 20 MG tablet Take 1 tablet (20 mg total) by mouth daily as needed (fluid retention). 90 tablet 3   Ginkgo Biloba (GNP GINGKO BILOBA EXTRACT PO) Take by mouth.     glipiZIDE  (GLUCOTROL ) 5 MG tablet Take 1 tablet (5 mg total) by mouth 2 (two) times daily before a meal. 180 tablet 3   glucose blood (ACCU-CHEK GUIDE TEST) test strip Use as instructed 100 strip 1   hydroxychloroquine  (PLAQUENIL ) 200 MG tablet Take 1 tablet (200 mg total) by mouth daily. 90 tablet 1   insulin  aspart (NOVOLOG  FLEXPEN) 100 UNIT/ML FlexPen Take according following sliding scale: Sliding Scale for glucose before eating. Maximum 36 units/day. Blood Glucose        Insulin  60-150                     None 151-200                   3 units 201-250                   5 units 251-300                   7 units 301-350                   9 units 351-400                  11 units   >400                       12 units and call provider 15 mL 4   insulin  degludec (TRESIBA  FLEXTOUCH) 100 UNIT/ML FlexTouch Pen Inject 20 Units into the skin daily. 15 mL 4   Insulin  Pen Needle 32G X 4 MM MISC Use 4x a day 300 each 3   Lancets (ACCU-CHEK SOFT TOUCH) lancets Test once per day and diagnosis code is E 11.9 and dispense for Aviva plus 100 each 3   levocetirizine (XYZAL ) 5 MG tablet TAKE 1 TABLET BY MOUTH EVERY DAY IN THE EVENING 90 tablet 3   levocetirizine (XYZAL ) 5 MG tablet Take 1 tablet (5 mg total) by mouth every evening. 90 tablet 3   lidocaine -prilocaine  (EMLA ) cream Apply to affected area once 30 g 3   metoCLOPramide  (REGLAN ) 10 MG tablet Take 1 tablet (10 mg total) by mouth 3 (three) times daily with meals for 5 days. 15 tablet 0   mupirocin  ointment (BACTROBAN ) 2 % Apply 1 Application topically 2 (two) times daily. 22 g 5   omeprazole  (PRILOSEC) 40 MG capsule TAKE 1 CAPSULE BY MOUTH EVERY DAY 90  capsule 3   ondansetron  (ZOFRAN ) 4 MG tablet Take 1 tablet (4 mg total) by mouth every 8 (eight) hours as needed for nausea or vomiting. 90 tablet 3  ondansetron  (ZOFRAN ) 8 MG tablet Take 1 tablet (8 mg total) by mouth every 8 (eight) hours as needed for nausea or vomiting. Start on the third day after chemotherapy. 30 tablet 1   oxyCODONE -acetaminophen  (PERCOCET) 10-325 MG tablet Take 1 tablet by mouth every 6 (six) hours as needed for pain. 120 tablet 0   oxyCODONE -acetaminophen  (PERCOCET) 10-325 MG tablet Take 1 tablet by mouth every 6 (six) hours as needed for pain. 120 tablet 0   oxyCODONE -acetaminophen  (PERCOCET) 10-325 MG tablet Take 1 tablet by mouth every 6 (six) hours as needed for pain. 120 tablet 0   polyethylene glycol (MIRALAX  / GLYCOLAX ) 17 g packet Take 17 g by mouth daily as needed for mild constipation.     potassium chloride  (KLOR-CON ) 10 MEQ tablet TAKE 1 TABLET BY MOUTH EVERY DAY 90 tablet 3   pregabalin  (LYRICA ) 100 MG capsule Take 1 capsule (100 mg total) by mouth at bedtime. 90 capsule 1   prochlorperazine  (COMPAZINE ) 10 MG tablet Take 1 tablet (10 mg total) by mouth every 6 (six) hours as needed for nausea or vomiting. 30 tablet 1   RESTASIS  0.05 % ophthalmic emulsion Place 1 drop into both eyes 2 (two) times daily.     sitaGLIPtin  (JANUVIA ) 100 MG tablet Take 1 tablet (100 mg total) by mouth at bedtime. 90 tablet 3   sucralfate  (CARAFATE ) 1 g tablet Take 1 tablet (1 g total) by mouth 4 (four) times daily. Dissolve each tablet in 15 cc water before use. 120 tablet 2   No current facility-administered medications for this encounter.    Physical Findings: The patient is in no acute distress. Patient is alert and oriented.  vitals were not taken for this visit. .  No significant changes. Lungs are clear to auscultation bilaterally. Heart has regular rate and rhythm. No palpable cervical, supraclavicular, or axillary adenopathy. Abdomen soft, non-tender, normal bowel  sounds.   Lab Findings: Lab Results  Component Value Date   WBC 3.4 (L) 01/06/2024   HGB 10.2 (L) 01/06/2024   HCT 32.9 (L) 01/06/2024   MCV 77.2 (L) 01/06/2024   PLT 201 01/06/2024    Radiographic Findings: No results found.  Impression: Metastatic non-small cell lung cancer, adenocarcinoma initially diagnosed as stage IIIa (T1c, N2, M0) non-small cell lung cancer with evidence for disease progression with bilateral pulmonary nodules in addition to bone metastasis in September 2025   The patient is recovering from the effects of radiation.  ***  Plan:  ***   *** minutes of total time was spent for this patient encounter, including preparation, face-to-face counseling with the patient and coordination of care, physical exam, and documentation of the encounter. ____________________________________  Lynwood CHARM Nasuti, PhD, MD  This document serves as a record of services personally performed by Lynwood Nasuti, MD. It was created on his behalf by Dorthy Fuse, a trained medical scribe. The creation of this record is based on the scribe's personal observations and the provider's statements to them. This document has been checked and approved by the attending provider.\

## 2024-01-25 ENCOUNTER — Encounter: Payer: Self-pay | Admitting: Radiation Oncology

## 2024-01-25 ENCOUNTER — Ambulatory Visit
Admission: RE | Admit: 2024-01-25 | Discharge: 2024-01-25 | Disposition: A | Discharge status: Home / Self Care | Source: Ambulatory Visit | Attending: Radiation Oncology | Admitting: Radiation Oncology

## 2024-01-25 VITALS — BP 157/88 | HR 82 | Temp 97.1°F | Resp 18 | Ht 63.0 in | Wt 187.1 lb

## 2024-01-25 DIAGNOSIS — Z79899 Other long term (current) drug therapy: Secondary | ICD-10-CM | POA: Diagnosis not present

## 2024-01-25 DIAGNOSIS — Z794 Long term (current) use of insulin: Secondary | ICD-10-CM | POA: Insufficient documentation

## 2024-01-25 DIAGNOSIS — Z7984 Long term (current) use of oral hypoglycemic drugs: Secondary | ICD-10-CM | POA: Diagnosis not present

## 2024-01-25 DIAGNOSIS — Z9221 Personal history of antineoplastic chemotherapy: Secondary | ICD-10-CM | POA: Insufficient documentation

## 2024-01-25 DIAGNOSIS — Z7952 Long term (current) use of systemic steroids: Secondary | ICD-10-CM | POA: Diagnosis not present

## 2024-01-25 DIAGNOSIS — C3431 Malignant neoplasm of lower lobe, right bronchus or lung: Secondary | ICD-10-CM | POA: Insufficient documentation

## 2024-01-25 DIAGNOSIS — C7951 Secondary malignant neoplasm of bone: Secondary | ICD-10-CM | POA: Diagnosis present

## 2024-01-25 DIAGNOSIS — Z923 Personal history of irradiation: Secondary | ICD-10-CM | POA: Insufficient documentation

## 2024-01-25 HISTORY — DX: Personal history of irradiation: Z92.3

## 2024-01-25 NOTE — Progress Notes (Signed)
 Wendy [name] is here today for follow up post radiation to the T-spine.   Does the patient complain of any of the following: Pain: Reports tightness to back.  Swallowing/choking concerns:  No Appetite: Good  Energy Level: Low Post radiation skin Changes: No    Additional comments if applicable:   BP (!) 157/88 (BP Location: Left Arm, Patient Position: Sitting)   Pulse 82   Temp (!) 97.1 F (36.2 C) (Temporal)   Resp 18   Ht 5' 3 (1.6 m)   Wt 187 lb 2 oz (84.9 kg)   SpO2 97%   BMI 33.15 kg/m

## 2024-01-26 MED FILL — Fosaprepitant Dimeglumine For IV Infusion 150 MG (Base Eq): INTRAVENOUS | Qty: 5 | Status: AC

## 2024-01-27 ENCOUNTER — Ambulatory Visit (HOSPITAL_COMMUNITY)
Admission: RE | Admit: 2024-01-27 | Discharge: 2024-01-27 | Disposition: A | Discharge status: Home / Self Care | Source: Ambulatory Visit | Attending: Cardiology | Admitting: Cardiology

## 2024-01-27 ENCOUNTER — Inpatient Hospital Stay

## 2024-01-27 ENCOUNTER — Encounter (HOSPITAL_COMMUNITY)

## 2024-01-27 ENCOUNTER — Telehealth: Payer: Self-pay | Admitting: *Deleted

## 2024-01-27 ENCOUNTER — Inpatient Hospital Stay (HOSPITAL_BASED_OUTPATIENT_CLINIC_OR_DEPARTMENT_OTHER): Admitting: Physician Assistant

## 2024-01-27 VITALS — BP 148/83 | HR 88 | Temp 97.1°F | Resp 17 | Ht 63.0 in | Wt 183.0 lb

## 2024-01-27 DIAGNOSIS — Z5111 Encounter for antineoplastic chemotherapy: Secondary | ICD-10-CM | POA: Diagnosis not present

## 2024-01-27 DIAGNOSIS — C3431 Malignant neoplasm of lower lobe, right bronchus or lung: Secondary | ICD-10-CM | POA: Insufficient documentation

## 2024-01-27 DIAGNOSIS — Z95828 Presence of other vascular implants and grafts: Secondary | ICD-10-CM

## 2024-01-27 DIAGNOSIS — M7989 Other specified soft tissue disorders: Secondary | ICD-10-CM | POA: Insufficient documentation

## 2024-01-27 LAB — CBC WITH DIFFERENTIAL (CANCER CENTER ONLY)
Abs Immature Granulocytes: 0.02 K/uL (ref 0.00–0.07)
Basophils Absolute: 0 K/uL (ref 0.0–0.1)
Basophils Relative: 0 %
Eosinophils Absolute: 0 K/uL (ref 0.0–0.5)
Eosinophils Relative: 0 %
HCT: 35.4 % — ABNORMAL LOW (ref 36.0–46.0)
Hemoglobin: 11.2 g/dL — ABNORMAL LOW (ref 12.0–15.0)
Immature Granulocytes: 1 %
Lymphocytes Relative: 7 %
Lymphs Abs: 0.3 K/uL — ABNORMAL LOW (ref 0.7–4.0)
MCH: 24.4 pg — ABNORMAL LOW (ref 26.0–34.0)
MCHC: 31.6 g/dL (ref 30.0–36.0)
MCV: 77.1 fL — ABNORMAL LOW (ref 80.0–100.0)
Monocytes Absolute: 0.3 K/uL (ref 0.1–1.0)
Monocytes Relative: 7 %
Neutro Abs: 3.2 K/uL (ref 1.7–7.7)
Neutrophils Relative %: 85 %
Platelet Count: 148 K/uL — ABNORMAL LOW (ref 150–400)
RBC: 4.59 MIL/uL (ref 3.87–5.11)
RDW: 19.9 % — ABNORMAL HIGH (ref 11.5–15.5)
WBC Count: 3.8 K/uL — ABNORMAL LOW (ref 4.0–10.5)
nRBC: 0 % (ref 0.0–0.2)

## 2024-01-27 LAB — CMP (CANCER CENTER ONLY)
ALT: 15 U/L (ref 0–44)
AST: 22 U/L (ref 15–41)
Albumin: 3.9 g/dL (ref 3.5–5.0)
Alkaline Phosphatase: 99 U/L (ref 38–126)
Anion gap: 10 (ref 5–15)
BUN: 11 mg/dL (ref 8–23)
CO2: 25 mmol/L (ref 22–32)
Calcium: 9.3 mg/dL (ref 8.9–10.3)
Chloride: 104 mmol/L (ref 98–111)
Creatinine: 0.75 mg/dL (ref 0.44–1.00)
GFR, Estimated: >60 mL/min (ref >60)
Glucose, Bld: 211 mg/dL — ABNORMAL HIGH (ref 70–99)
Potassium: 4.1 mmol/L (ref 3.5–5.1)
Sodium: 138 mmol/L (ref 135–145)
Total Bilirubin: 0.8 mg/dL (ref 0.0–1.2)
Total Protein: 7.9 g/dL (ref 6.5–8.1)

## 2024-01-27 LAB — TOTAL PROTEIN, URINE DIPSTICK: Protein, ur: NEGATIVE mg/dL

## 2024-01-27 MED ORDER — SODIUM CHLORIDE 0.9 % IV SOLN
15.0000 mg/kg | Freq: Once | INTRAVENOUS | Status: AC
  Administered 2024-01-27: 1200 mg via INTRAVENOUS
  Filled 2024-01-27: qty 48

## 2024-01-27 MED ORDER — SODIUM CHLORIDE 0.9 % IV SOLN
500.0000 mg/m2 | Freq: Once | INTRAVENOUS | Status: AC
  Administered 2024-01-27: 1000 mg via INTRAVENOUS
  Filled 2024-01-27: qty 40

## 2024-01-27 MED ORDER — SODIUM CHLORIDE 0.9 % IV SOLN: INTRAVENOUS | Status: DC

## 2024-01-27 MED ORDER — PALONOSETRON HCL INJECTION 0.25 MG/5ML
0.2500 mg | Freq: Once | INTRAVENOUS | Status: AC
  Administered 2024-01-27: 0.25 mg via INTRAVENOUS
  Filled 2024-01-27: qty 5

## 2024-01-27 MED ORDER — SODIUM CHLORIDE 0.9 % IV SOLN
438.5000 mg | Freq: Once | INTRAVENOUS | Status: AC
  Administered 2024-01-27: 440 mg via INTRAVENOUS
  Filled 2024-01-27: qty 44

## 2024-01-27 MED ORDER — SODIUM CHLORIDE 0.9 % IV SOLN
150.0000 mg | Freq: Once | INTRAVENOUS | Status: AC
  Administered 2024-01-27: 150 mg via INTRAVENOUS
  Filled 2024-01-27: qty 150

## 2024-01-27 MED ORDER — CYANOCOBALAMIN 1000 MCG/ML IJ SOLN
1000.0000 ug | Freq: Once | INTRAMUSCULAR | Status: AC
  Administered 2024-01-27: 1000 ug via INTRAMUSCULAR
  Filled 2024-01-27: qty 1

## 2024-01-27 MED ORDER — DENOSUMAB 120 MG/1.7ML ~~LOC~~ SOLN
120.0000 mg | Freq: Once | SUBCUTANEOUS | Status: AC
  Administered 2024-01-27: 120 mg via SUBCUTANEOUS
  Filled 2024-01-27: qty 1.7

## 2024-01-27 MED ORDER — DEXAMETHASONE SOD PHOSPHATE PF 10 MG/ML IJ SOLN
10.0000 mg | Freq: Once | INTRAMUSCULAR | Status: AC
  Administered 2024-01-27: 10 mg via INTRAVENOUS

## 2024-01-27 NOTE — Patient Instructions (Signed)
 [facility] - A DEPT OF [facility]  Discharge Instructions: Thank you for choosing [facility] to provide your oncology and hematology care.   If you have a lab appointment with the Cancer Center, please go directly to the Cancer Center and check in at the registration area.   Wear comfortable clothing and clothing appropriate for easy access to any Portacath or PICC line.   We strive to give you quality time with your provider. You may need to reschedule your appointment if you arrive late (15 or more minutes).  Arriving late affects you and other patients whose appointments are after yours.  Also, if you miss three or more appointments without notifying the office, you may be dismissed from the clinic at the provider's discretion.      For prescription refill requests, have your pharmacy contact our office and allow 72 hours for refills to be completed.    Today you received the following chemotherapy and/or immunotherapy agents Bevacizumab, Alimta, Carboplatin       To help prevent nausea and vomiting after your treatment, we encourage you to take your nausea medication as directed.  BELOW ARE SYMPTOMS THAT SHOULD BE REPORTED IMMEDIATELY: *FEVER GREATER THAN 100.4 F (38 C) OR HIGHER *CHILLS OR SWEATING *NAUSEA AND VOMITING THAT IS NOT CONTROLLED WITH YOUR NAUSEA MEDICATION *UNUSUAL SHORTNESS OF BREATH *UNUSUAL BRUISING OR BLEEDING *URINARY PROBLEMS (pain or burning when urinating, or frequent urination) *BOWEL PROBLEMS (unusual diarrhea, constipation, pain near the anus) TENDERNESS IN MOUTH AND THROAT WITH OR WITHOUT PRESENCE OF ULCERS (sore throat, sores in mouth, or a toothache) UNUSUAL RASH, SWELLING OR PAIN  UNUSUAL VAGINAL DISCHARGE OR ITCHING   Items with * indicate a potential emergency and should be followed up as soon as possible or go to the Emergency Department if any problems should occur.  Please show the CHEMOTHERAPY ALERT  CARD or IMMUNOTHERAPY ALERT CARD at check-in to the Emergency Department and triage nurse.  Should you have questions after your visit or need to cancel or reschedule your appointment, please contact [facility] - A DEPT OF [facility]  Dept: [phone]  and follow the prompts.  Office hours are 8:00 a.m. to 4:30 p.m. Monday - Friday. Please note that voicemails left after 4:00 p.m. may not be returned until the following business day.  We are closed weekends and major holidays. You have access to a nurse at all times for urgent questions. Please call the main number to the clinic Dept: [phone] and follow the prompts.   For any non-urgent questions, you may also contact your provider using MyChart. We now offer e-Visits for anyone 75 and older to request care online for non-urgent symptoms. For details visit [email or web address].   Also download the MyChart app! Go to the app store, search MyChart, open the app, select [place], and log in with your MyChart username and password.  Bevacizumab Injection What is this medication? BEVACIZUMAB (be va SIZ yoo mab) treats some types of cancer. It works by blocking a protein that causes cancer cells to grow and multiply. This helps to slow or stop the spread of cancer cells. It is a monoclonal antibody. This medicine may be used for other purposes; ask your health care provider or pharmacist if you have questions. COMMON BRAND NAME(S): Alymsys, Avastin, MVASI, Vegzalma, Zirabev What should I tell my care team before I take this medication? They need to know  if you have any of these conditions: Blood clots Coughing up blood Having or recent surgery Heart failure High blood pressure History of a connection between 2 or more body parts that do not usually connect (fistula) History of a tear in your stomach or intestines Protein in your urine An unusual or allergic reaction to bevacizumab, other medications,  foods, dyes, or preservatives Pregnant or trying to get pregnant Breast-feeding How should I use this medication? This medication is injected into a vein. It is given by your care team in a hospital or clinic setting. Talk to your care team the use of this medication in children. Special care may be needed. Overdosage: If you think you have taken too much of this medicine contact a poison control center or emergency room at once. NOTE: This medicine is only for you. Do not share this medicine with others. What if I miss a dose? Keep appointments for follow-up doses. It is important not to miss your dose. Call your care team if you are unable to keep an appointment. What may interact with this medication? Interactions are not expected. This list may not describe all possible interactions. Give your health care provider a list of all the medicines, herbs, non-prescription drugs, or dietary supplements you use. Also tell them if you smoke, drink alcohol, or use illegal drugs. Some items may interact with your medicine. What should I watch for while using this medication? Your condition will be monitored carefully while you are receiving this medication. You may need blood work while taking this medication. This medication may make you feel generally unwell. This is not uncommon as chemotherapy can affect healthy cells as well as cancer cells. Report any side effects. Continue your course of treatment even though you feel ill unless your care team tells you to stop. This medication may increase your risk to bruise or bleed. Call your care team if you notice any unusual bleeding. Before having surgery, talk to your care team to make sure it is ok. This medication can increase the risk of poor healing of your surgical site or wound. You will need to stop this medication for 28 days before surgery. After surgery, wait at least 28 days before restarting this medication. Make sure the surgical site or wound  is healed enough before restarting this medication. Talk to your care team if questions. Talk to your care team if you may be pregnant. Serious birth defects can occur if you take this medication during pregnancy and for 6 months after the last dose. Contraception is recommended while taking this medication and for 6 months after the last dose. Your care team can help you find the option that works for you. Do not breastfeed while taking this medication and for 6 months after the last dose. This medication can cause infertility. Talk to your care team if you are concerned about your fertility. What side effects may I notice from receiving this medication? Side effects that you should report to your care team as soon as possible: Allergic reactions--skin rash, itching, hives, swelling of the face, lips, tongue, or throat Bleeding--bloody or black, tar-like stools, vomiting blood or brown material that looks like coffee grounds, red or dark brown urine, small red or purple spots on skin, unusual bruising or bleeding Blood clot--pain, swelling, or warmth in the leg, shortness of breath, chest pain Heart attack--pain or tightness in the chest, shoulders, arms, or jaw, nausea, shortness of breath, cold or clammy skin, feeling faint or lightheaded  Heart failure--shortness of breath, swelling of the ankles, feet, or hands, sudden weight gain, unusual weakness or fatigue Increase in blood pressure Infection--fever, chills, cough, sore throat, wounds that don't heal, pain or trouble when passing urine, general feeling of discomfort or being unwell Infusion reactions--chest pain, shortness of breath or trouble breathing, feeling faint or lightheaded Kidney injury--decrease in the amount of urine, swelling of the ankles, hands, or feet Stomach pain that is severe, does not go away, or gets worse Stroke--sudden numbness or weakness of the face, arm, or leg, trouble speaking, confusion, trouble walking, loss of  balance or coordination, dizziness, severe headache, change in vision Sudden and severe headache, confusion, change in vision, seizures, which may be signs of posterior reversible encephalopathy syndrome (PRES) Side effects that usually do not require medical attention (report to your care team if they continue or are bothersome): Back pain Change in taste Diarrhea Dry skin Increased tears Nosebleed This list may not describe all possible side effects. Call your doctor for medical advice about side effects. You may report side effects to FDA at 1-800-FDA-1088. Where should I keep my medication? This medication is given in a hospital or clinic. It will not be stored at home. NOTE: This sheet is a summary. It may not cover all possible information. If you have questions about this medicine, talk to your doctor, pharmacist, or health care provider.  2024 Elsevier/Gold Standard (2021-07-05 00:00:00)

## 2024-01-27 NOTE — Telephone Encounter (Signed)
 Called and spoke with Wendy [name] in [place].  Scheduled patient for right ultra sound of lower extremity per Cassie/PA.  Patient scheduled today @ [address], [place] for today @ 1630.  Patient informed per John/CMA

## 2024-02-01 ENCOUNTER — Telehealth: Payer: Self-pay

## 2024-02-01 NOTE — Telephone Encounter (Signed)
 Tried to reach patient in regards to US  results from DVT study.  Per Cassie, PA, patient does not have any evidence of a blood clot.  LVM with patient and asked for a return call with results.

## 2024-02-02 ENCOUNTER — Encounter: Payer: Self-pay | Admitting: Internal Medicine

## 2024-02-02 NOTE — Telephone Encounter (Signed)
 Spoke with patient in regards to DVT study.  Patient voiced understanding.

## 2024-02-03 ENCOUNTER — Ambulatory Visit: Admitting: Endocrinology

## 2024-02-04 ENCOUNTER — Ambulatory Visit

## 2024-02-04 ENCOUNTER — Other Ambulatory Visit

## 2024-02-04 ENCOUNTER — Ambulatory Visit: Admitting: Internal Medicine

## 2024-02-08 ENCOUNTER — Ambulatory Visit (INDEPENDENT_AMBULATORY_CARE_PROVIDER_SITE_OTHER): Payer: Medicare HMO | Admitting: Otolaryngology

## 2024-02-10 ENCOUNTER — Ambulatory Visit (HOSPITAL_COMMUNITY): Admission: RE | Admit: 2024-02-10 | Discharge: 2024-02-10 | Attending: Physician Assistant

## 2024-02-10 DIAGNOSIS — C3431 Malignant neoplasm of lower lobe, right bronchus or lung: Secondary | ICD-10-CM | POA: Insufficient documentation

## 2024-02-10 MED ORDER — IOHEXOL 300 MG/ML  SOLN
100.0000 mL | Freq: Once | INTRAMUSCULAR | Status: AC | PRN
  Administered 2024-02-10: 100 mL via INTRAVENOUS

## 2024-02-16 MED FILL — Fosaprepitant Dimeglumine For IV Infusion 150 MG (Base Eq): INTRAVENOUS | Qty: 5 | Status: AC

## 2024-02-17 ENCOUNTER — Inpatient Hospital Stay

## 2024-02-17 ENCOUNTER — Inpatient Hospital Stay: Attending: Internal Medicine | Admitting: Physician Assistant

## 2024-02-17 ENCOUNTER — Telehealth: Payer: Self-pay | Admitting: *Deleted

## 2024-02-17 ENCOUNTER — Inpatient Hospital Stay: Attending: Internal Medicine

## 2024-02-17 VITALS — BP 118/73 | HR 98 | Temp 98.3°F | Resp 16 | Ht 63.0 in | Wt 185.0 lb

## 2024-02-17 DIAGNOSIS — Z9221 Personal history of antineoplastic chemotherapy: Secondary | ICD-10-CM | POA: Diagnosis not present

## 2024-02-17 DIAGNOSIS — J432 Centrilobular emphysema: Secondary | ICD-10-CM | POA: Insufficient documentation

## 2024-02-17 DIAGNOSIS — Z79899 Other long term (current) drug therapy: Secondary | ICD-10-CM | POA: Insufficient documentation

## 2024-02-17 DIAGNOSIS — Z8619 Personal history of other infectious and parasitic diseases: Secondary | ICD-10-CM | POA: Insufficient documentation

## 2024-02-17 DIAGNOSIS — C7951 Secondary malignant neoplasm of bone: Secondary | ICD-10-CM | POA: Diagnosis not present

## 2024-02-17 DIAGNOSIS — Z885 Allergy status to narcotic agent status: Secondary | ICD-10-CM | POA: Diagnosis not present

## 2024-02-17 DIAGNOSIS — I700 Atherosclerosis of aorta: Secondary | ICD-10-CM | POA: Diagnosis not present

## 2024-02-17 DIAGNOSIS — Z9226 Personal history of immune checkpoint inhibitor therapy: Secondary | ICD-10-CM | POA: Diagnosis not present

## 2024-02-17 DIAGNOSIS — R21 Rash and other nonspecific skin eruption: Secondary | ICD-10-CM | POA: Insufficient documentation

## 2024-02-17 DIAGNOSIS — C3431 Malignant neoplasm of lower lobe, right bronchus or lung: Secondary | ICD-10-CM | POA: Diagnosis not present

## 2024-02-17 DIAGNOSIS — D709 Neutropenia, unspecified: Secondary | ICD-10-CM | POA: Insufficient documentation

## 2024-02-17 DIAGNOSIS — Z5112 Encounter for antineoplastic immunotherapy: Secondary | ICD-10-CM | POA: Diagnosis present

## 2024-02-17 DIAGNOSIS — Z923 Personal history of irradiation: Secondary | ICD-10-CM | POA: Diagnosis not present

## 2024-02-17 DIAGNOSIS — Z981 Arthrodesis status: Secondary | ICD-10-CM | POA: Insufficient documentation

## 2024-02-17 DIAGNOSIS — M069 Rheumatoid arthritis, unspecified: Secondary | ICD-10-CM | POA: Diagnosis not present

## 2024-02-17 DIAGNOSIS — K5900 Constipation, unspecified: Secondary | ICD-10-CM | POA: Diagnosis not present

## 2024-02-17 DIAGNOSIS — E669 Obesity, unspecified: Secondary | ICD-10-CM | POA: Diagnosis not present

## 2024-02-17 DIAGNOSIS — K449 Diaphragmatic hernia without obstruction or gangrene: Secondary | ICD-10-CM | POA: Diagnosis not present

## 2024-02-17 DIAGNOSIS — Z9849 Cataract extraction status, unspecified eye: Secondary | ICD-10-CM | POA: Insufficient documentation

## 2024-02-17 DIAGNOSIS — R591 Generalized enlarged lymph nodes: Secondary | ICD-10-CM | POA: Insufficient documentation

## 2024-02-17 DIAGNOSIS — E042 Nontoxic multinodular goiter: Secondary | ICD-10-CM | POA: Insufficient documentation

## 2024-02-17 DIAGNOSIS — D259 Leiomyoma of uterus, unspecified: Secondary | ICD-10-CM | POA: Insufficient documentation

## 2024-02-17 DIAGNOSIS — K4020 Bilateral inguinal hernia, without obstruction or gangrene, not specified as recurrent: Secondary | ICD-10-CM | POA: Insufficient documentation

## 2024-02-17 DIAGNOSIS — S8990XA Unspecified injury of unspecified lower leg, initial encounter: Secondary | ICD-10-CM | POA: Insufficient documentation

## 2024-02-17 DIAGNOSIS — Z886 Allergy status to analgesic agent status: Secondary | ICD-10-CM | POA: Insufficient documentation

## 2024-02-17 DIAGNOSIS — I119 Hypertensive heart disease without heart failure: Secondary | ICD-10-CM | POA: Insufficient documentation

## 2024-02-17 DIAGNOSIS — M8440XA Pathological fracture, unspecified site, initial encounter for fracture: Secondary | ICD-10-CM | POA: Diagnosis not present

## 2024-02-17 LAB — CMP (CANCER CENTER ONLY)
ALT: 16 U/L (ref 0–44)
AST: 18 U/L (ref 15–41)
Albumin: 3.9 g/dL (ref 3.5–5.0)
Alkaline Phosphatase: 87 U/L (ref 38–126)
Anion gap: 9 (ref 5–15)
BUN: 19 mg/dL (ref 8–23)
CO2: 25 mmol/L (ref 22–32)
Calcium: 9.3 mg/dL (ref 8.9–10.3)
Chloride: 108 mmol/L (ref 98–111)
Creatinine: 1.17 mg/dL — ABNORMAL HIGH (ref 0.44–1.00)
GFR, Estimated: 48 mL/min — ABNORMAL LOW (ref >60)
Glucose, Bld: 89 mg/dL (ref 70–99)
Potassium: 4.2 mmol/L (ref 3.5–5.1)
Sodium: 142 mmol/L (ref 135–145)
Total Bilirubin: 0.6 mg/dL (ref 0.0–1.2)
Total Protein: 7.4 g/dL (ref 6.5–8.1)

## 2024-02-17 LAB — CBC WITH DIFFERENTIAL (CANCER CENTER ONLY)
Abs Immature Granulocytes: 0.02 K/uL (ref 0.00–0.07)
Basophils Absolute: 0 K/uL (ref 0.0–0.1)
Basophils Relative: 0 %
Eosinophils Absolute: 0 K/uL (ref 0.0–0.5)
Eosinophils Relative: 0 %
HCT: 34.9 % — ABNORMAL LOW (ref 36.0–46.0)
Hemoglobin: 11 g/dL — ABNORMAL LOW (ref 12.0–15.0)
Immature Granulocytes: 1 %
Lymphocytes Relative: 29 %
Lymphs Abs: 0.8 K/uL (ref 0.7–4.0)
MCH: 24.9 pg — ABNORMAL LOW (ref 26.0–34.0)
MCHC: 31.5 g/dL (ref 30.0–36.0)
MCV: 79.1 fL — ABNORMAL LOW (ref 80.0–100.0)
Monocytes Absolute: 0.8 K/uL (ref 0.1–1.0)
Monocytes Relative: 31 %
Neutro Abs: 1 K/uL — ABNORMAL LOW (ref 1.7–7.7)
Neutrophils Relative %: 39 %
Platelet Count: 121 K/uL — ABNORMAL LOW (ref 150–400)
RBC: 4.41 MIL/uL (ref 3.87–5.11)
RDW: 21.8 % — ABNORMAL HIGH (ref 11.5–15.5)
WBC Count: 2.7 K/uL — ABNORMAL LOW (ref 4.0–10.5)
nRBC: 0 % (ref 0.0–0.2)

## 2024-02-17 NOTE — Telephone Encounter (Signed)
 Called CT Dept.@ [facility] and spoke with Ozell.  Requested CT report ASAP for this patient has an appointment this morning per Cassie /PA. States he will reach out to Radiology

## 2024-02-17 NOTE — Progress Notes (Unsigned)
 [facility] OFFICE PROGRESS NOTE  Wendy [name] LABOR, MD [address] [place] [address]  DIAGNOSIS: 1) Metastatic non-small cell lung cancer, adenocarcinoma initially diagnosed as stage IIIa (T1c, N2, M0) non-small cell lung cancer, adenocarcinoma presented with right lower lobe lung nodule in addition to right paratracheal lymphadenopathy diagnosed in February 2025. The patient has evidence for disease progression with bilateral pulmonary nodules in addition to bone metastasis in September 2025.   Molecular studies by [email or web address] showed no actionable mutations and negative PD-L1 expression.   2) history of rheumatoid arthritis and currently on treatment with Plaquenil .  PRIOR THERAPY: 1) A course of concurrent chemoradiation with weekly carboplatin  for AUC of 2 and paclitaxel  45 Mg/M2.  First dose May 04, 2023.  Status post 7 cycles.  Last dose was given on 06/15/2023. 2) Consolidation treatment with immunotherapy with Imfinzi  1500 Mg IV every 4 weeks status post 5 cycles.  Last dose was given 11/10/2023 discontinued secondary to disease progression.  CURRENT THERAPY: 1) Systemic chemotherapy with carboplatin  for AUC of 5, Alimta  500 mg/M2 and Avastin  15 MGs/KG every 3 weeks.  First dose 12/03/2023.  Status post 3 cycles. 2) Xgeva  120 mg subcutaneous every 3 months start January 27, 2024.  INTERVAL HISTORY: Wendy [name] 78 y.o. female returns to the clinic today visit for a follow-up visit.  The patient was last seen by myself on 01/27/24.   She denies any major changes in her health since she was seen.  He has some stable fatigue.  She also previously had a rash on her face in which her PCP gave her Bactroban .  Her rash had improved.  However she developed a mild rash on her chin again a few days ago that is improving with the ointment.  The patient is wondering if she has food allergies.  She denies any fever, chills, or signs of infection such as upper  respiratory infection, burning with urination, diarrhea, nausea, or vomiting. She reports mild stable dyspnea on exertion. Denies any significant cough, chest pain, or hemoptysis. Denies any unusual diarrhea or constipation.  She ever does experience constipation she uses MiraLAX .  She denies any headache or visual changes.  She recently had a restaging CT scan.  She is here today for evaluation and to review her scan results before undergoing cycle #4.  She has some neutropenia today.  She denies any signs and symptoms of infection at this time.   MEDICAL HISTORY: Past Medical History:  Diagnosis Date   Allergy    year around   Anemia    long time ago    Anxiety    Arthritis    RA    Cataract    removed  both dr effie-    Complication of anesthesia    DEPENDENT EDEMA 07/21/2007   DIABETES MELLITUS, TYPE II 11/05/2006   FOOT PAIN 05/23/2009   GERD 11/20/2006   Hepatitis    HEPATITIS C 02/08/2007   sees GI @ [facility]   History of radiation therapy    Thoracic Spine-12/14/23-12/25/23- Dr. 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 Jon Jacob     ALLERGIES:  is allergic to aspirin and tramadol.  MEDICATIONS:  Current Outpatient Medications  Medication Sig Dispense Refill   Accu-Chek Softclix Lancets lancets Test once per day and diagnosis code is E 11.9 and dispense for (ACCU-CHEK Soft Touch) 100 each 12   ALPRAZolam  (XANAX ) 1 MG tablet TAKE 1 TABLET (1 MG TOTAL) BY MOUTH AT BEDTIME AS NEEDED FOR ANXIETY OR SLEEP. 30 tablet 5   amLODipine  (NORVASC ) 10 MG tablet Take 1 tablet (10 mg total) by mouth daily. 90 tablet 3   atorvastatin  (LIPITOR) 10 MG tablet TAKE 1 TABLET BY MOUTH EVERY DAY 90 tablet 3   Blood Glucose Monitoring Suppl  (ACCU-CHEK GUIDE) w/Device KIT Use to test sugars daily. 1 kit 0   Blood Glucose Monitoring Suppl DEVI 1 each by Does not apply route in the morning, at noon, and at bedtime. May substitute to any manufacturer covered by patient's insurance. 1 each 0   Blood Pressure Monitor KIT Use to check blood pressure. 1 kit 0   Cholecalciferol (VITAMIN D3) 20 MCG (800 UNIT) TABS Take 800 Units by mouth daily.     dexamethasone  (DECADRON ) 4 MG tablet Take 1 tab by mouth 2 times daily starting day before pemetrexed . Then take 2 tabs daily x 3 days starting day after chemo. Take with food. 30 tablet 1   DULoxetine  (CYMBALTA ) 30 MG capsule Take 1 capsule (30 mg total) by mouth at bedtime.     fluticasone  (FLONASE ) 50 MCG/ACT nasal spray SPRAY 2 SPRAYS INTO EACH NOSTRIL EVERY DAY 48 mL 3   folic acid  (FOLVITE ) 1 MG tablet Take 1 tablet (1 mg total) by mouth daily. Start 7 days before pemetrexed  chemotherapy. Continue until 21 days after pemetrexed  completed. 100 tablet 3   furosemide  (LASIX ) 20 MG tablet Take 1 tablet (20 mg total) by mouth daily as needed (fluid retention). 90 tablet 3   Ginkgo Biloba (GNP GINGKO BILOBA EXTRACT PO) Take by mouth.     glipiZIDE  (GLUCOTROL ) 5 MG tablet Take 1 tablet (5 mg total) by mouth 2 (two) times daily before a meal. 180 tablet 3   glucose blood (ACCU-CHEK GUIDE TEST) test strip Use as instructed 100 strip 1   hydroxychloroquine  (PLAQUENIL ) 200 MG tablet Take 1 tablet (200 mg total) by mouth daily. 90 tablet 1   insulin  aspart (NOVOLOG  FLEXPEN) 100 UNIT/ML FlexPen Take according following sliding scale: Sliding Scale for glucose before eating. Maximum 36 units/day. Blood Glucose        Insulin  60-150                     None 151-200                   3 units 201-250                   5 units 251-300                   7 units 301-350                   9 units 351-400                  11 units   >400                       12 units and call provider 15 mL 4   insulin  degludec  (TRESIBA  FLEXTOUCH) 100 UNIT/ML FlexTouch Pen Inject 20 Units into the skin daily. 15 mL 4   Insulin  Pen Needle 32G X  4 MM MISC Use 4x a day 300 each 3   Lancets (ACCU-CHEK SOFT TOUCH) lancets Test once per day and diagnosis code is E 11.9 and dispense for Aviva plus 100 each 3   LANTUS  SOLOSTAR 100 UNIT/ML Solostar Pen Inject 25 Units into the skin at bedtime.     levocetirizine (XYZAL ) 5 MG tablet TAKE 1 TABLET BY MOUTH EVERY DAY IN THE EVENING 90 tablet 3   levocetirizine (XYZAL ) 5 MG tablet Take 1 tablet (5 mg total) by mouth every evening. 90 tablet 3   lidocaine -prilocaine  (EMLA ) cream Apply to affected area once 30 g 3   metoCLOPramide  (REGLAN ) 10 MG tablet Take 1 tablet (10 mg total) by mouth 3 (three) times daily with meals for 5 days. 15 tablet 0   mupirocin  ointment (BACTROBAN ) 2 % Apply 1 Application topically 2 (two) times daily. 22 g 5   omeprazole  (PRILOSEC) 40 MG capsule TAKE 1 CAPSULE BY MOUTH EVERY DAY 90 capsule 3   ondansetron  (ZOFRAN ) 4 MG tablet Take 1 tablet (4 mg total) by mouth every 8 (eight) hours as needed for nausea or vomiting. 90 tablet 3   ondansetron  (ZOFRAN ) 8 MG tablet Take 1 tablet (8 mg total) by mouth every 8 (eight) hours as needed for nausea or vomiting. Start on the third day after chemotherapy. 30 tablet 1   oxyCODONE -acetaminophen  (PERCOCET) 10-325 MG tablet Take 1 tablet by mouth every 6 (six) hours as needed for pain. 120 tablet 0   polyethylene glycol (MIRALAX  / GLYCOLAX ) 17 g packet Take 17 g by mouth daily as needed for mild constipation.     potassium chloride  (KLOR-CON ) 10 MEQ tablet TAKE 1 TABLET BY MOUTH EVERY DAY 90 tablet 3   pregabalin  (LYRICA ) 100 MG capsule Take 1 capsule (100 mg total) by mouth at bedtime. 90 capsule 1   prochlorperazine  (COMPAZINE ) 10 MG tablet Take 1 tablet (10 mg total) by mouth every 6 (six) hours as needed for nausea or vomiting. 30 tablet 1   RESTASIS  0.05 % ophthalmic emulsion Place 1 drop into both eyes 2 (two) times  daily.     sitaGLIPtin  (JANUVIA ) 100 MG tablet Take 1 tablet (100 mg total) by mouth at bedtime. 90 tablet 3   sucralfate  (CARAFATE ) 1 g tablet Take 1 tablet (1 g total) by mouth 4 (four) times daily. Dissolve each tablet in 15 cc water before use. 120 tablet 2   No current facility-administered medications for this visit.    SURGICAL HISTORY:  Past Surgical History:  Procedure Laterality Date   BRONCHIAL BRUSHINGS  04/07/2023   Procedure: BRONCHIAL BRUSHINGS;  Surgeon: Shelah Lamar RAMAN, MD;  Location: [facility] ENDOSCOPY;  Service: Pulmonary;;   BRONCHIAL NEEDLE ASPIRATION BIOPSY  04/07/2023   Procedure: BRONCHIAL NEEDLE ASPIRATION BIOPSIES;  Surgeon: Shelah Lamar RAMAN, MD;  Location: [facility] ENDOSCOPY;  Service: Pulmonary;;   CATARACT EXTRACTION     COLONOSCOPY  11/23/2019   per Dr. Teressa, adenmatous polyp, repeat in 7 yrs   ESOPHAGOGASTRODUODENOSCOPY  06/20/2008   at [facility], clear    FINE NEEDLE ASPIRATION  04/07/2023   Procedure: FINE NEEDLE ASPIRATION (FNA) LINEAR;  Surgeon: Shelah Lamar RAMAN, MD;  Location: [facility] ENDOSCOPY;  Service: Pulmonary;;   IR IMAGING GUIDED PORT INSERTION  04/30/2023   LUMBAR LAMINECTOMY     1995   ROTATOR CUFF REPAIR Right    SPINAL FUSION N/A 07/29/2022   Procedure: T10-PELVIS INSTRUMENTATION/ T10-L4 POSTERIOR SPINAL FUSION;  Surgeon: Georgina Ozell LABOR, MD;  Location: [facility] OR;  Service: Orthopedics;  Laterality: N/A;   UPPER GASTROINTESTINAL ENDOSCOPY  2010   [facility]    VIDEO BRONCHOSCOPY WITH ENDOBRONCHIAL ULTRASOUND N/A 04/07/2023   Procedure: VIDEO BRONCHOSCOPY WITH ENDOBRONCHIAL ULTRASOUND;  Surgeon: Shelah Lamar RAMAN, MD;  Location: [facility] ENDOSCOPY;  Service: Pulmonary;  Laterality: N/A;    REVIEW OF SYSTEMS:   Review of Systems  Constitutional: Stable fatigue. Negative for appetite change, chills, fatigue, fever and unexpected weight change.  HENT: Negative for mouth sores, nosebleeds, sore throat and trouble swallowing.   Eyes: Negative for eye problems and icterus.   Respiratory: Positive for stable mild dyspnea. Negative for hemoptysis and wheezing.   Cardiovascular: Negative for chest pain. Mild right lower extremity swelling.  Gastrointestinal: Negative for abdominal pain, constipation, diarrhea, nausea and vomiting.  Genitourinary: Negative for bladder incontinence, difficulty urinating, dysuria, frequency and hematuria.   Musculoskeletal: Negative for back pain, gait problem, neck pain and neck stiffness.  Skin: Mild rash on chin. Negative for itching and rash.  Neurological: Negative for dizziness, extremity weakness, gait problem, headaches, light-headedness and seizures.  Hematological: Negative for adenopathy. Does not bruise/bleed easily.  Psychiatric/Behavioral: Negative for confusion, depression and sleep disturbance. The patient is not nervous/anxious.     PHYSICAL EXAMINATION:  Blood pressure 118/73, pulse 98, temperature 98.3 F (36.8 C), temperature source Temporal, resp. rate 16, height 5' 3 (1.6 m), weight 185 lb (83.9 kg), SpO2 100%.  ECOG PERFORMANCE STATUS: 1  Physical Exam  Constitutional: Oriented to person, place, and time and well-developed, well-nourished, and in no distress.  HENT:  Head: Normocephalic and atraumatic.  Mouth/Throat: Oropharynx is clear and moist. No oropharyngeal exudate.  Eyes: Conjunctivae are normal. Right eye exhibits no discharge. Left eye exhibits no discharge. No scleral icterus.  Neck: Normal range of motion. Neck supple.  Cardiovascular: Normal rate, regular rhythm, normal heart sounds and intact distal pulses.   Pulmonary/Chest: Effort normal and breath sounds normal. No respiratory distress. No wheezes. No rales.  Abdominal: Soft. Bowel sounds are normal. Exhibits no distension and no mass. There is no tenderness.  Musculoskeletal: Normal range of motion. Mild right lower extremity swelling.   Lymphadenopathy:    No cervical adenopathy.  Neurological: Alert and oriented to person, place, and  time. Exhibits normal muscle tone. Gait normal. Coordination normal.  Skin: Skin is warm and dry. No rash noted. Not diaphoretic. No erythema. No pallor.  Psychiatric: Mood, memory and judgment normal.  Vitals reviewed.  LABORATORY DATA: Lab Results  Component Value Date   WBC 2.7 (L) 02/17/2024   HGB 11.0 (L) 02/17/2024   HCT 34.9 (L) 02/17/2024   MCV 79.1 (L) 02/17/2024   PLT 121 (L) 02/17/2024      Chemistry      Component Value Date/Time   NA 142 02/17/2024 0854   K 4.2 02/17/2024 0854   CL 108 02/17/2024 0854   CO2 25 02/17/2024 0854   BUN 19 02/17/2024 0854   CREATININE 1.17 (H) 02/17/2024 0854   CREATININE 1.07 (H) 09/16/2019 1511      Component Value Date/Time   CALCIUM  9.3 02/17/2024 0854   ALKPHOS 87 02/17/2024 0854   AST 18 02/17/2024 0854   ALT 16 02/17/2024 0854   BILITOT 0.6 02/17/2024 0854       RADIOGRAPHIC STUDIES:  CT CHEST ABDOMEN PELVIS W CONTRAST Result Date: 02/17/2024 CLINICAL DATA:  Lung cancer.  * Tracking Code: BO * EXAM: CT CHEST, ABDOMEN, AND PELVIS WITH CONTRAST TECHNIQUE: Multidetector CT imaging of the chest, abdomen and pelvis was  performed following the standard protocol during bolus administration of intravenous contrast. RADIATION DOSE REDUCTION: This exam was performed according to the departmental dose-optimization program which includes automated exposure control, adjustment of the mA and/or kV according to patient size and/or use of iterative reconstruction technique. CONTRAST:  OMNIPAQUE  IOHEXOL  300 MG/ML  SOLN COMPARISON:  PET 11/20/2023. FINDINGS: CT CHEST FINDINGS Cardiovascular: Right IJ Port-A-Cath terminates at the SVC RA junction. Atherosclerotic calcification of the aorta. Heart is at the upper limits of normal in size to minimally enlarged. No pericardial effusion. Mediastinum/Nodes: Low intermediate attenuation thyroid  nodules measure up to 2.7 cm on the right. No pathologically enlarged mediastinal, hilar or axillary  lymph nodes. Esophagus is grossly unremarkable. Lungs/Pleura: Centrilobular emphysema. Post radiation parenchymal retraction and bronchiectasis in the medial right hemithorax, overall more organized in appearance than on 11/20/2023. Lungs are otherwise clear. Specifically, previously seen in the bilateral lower lobe peribronchovascular ground-glass has resolved. No pleural fluid. Airway is unremarkable. Musculoskeletal: Lytic lesion and pathologic fracture involving T8, as on PET 11/20/2023. No new lytic lesions. Partially imaged thoracolumbar fusion. CT ABDOMEN PELVIS FINDINGS Hepatobiliary: Liver is slightly decreased in attenuation diffusely. Liver and gallbladder are otherwise unremarkable. No biliary ductal dilatation. Pancreas: Negative. Spleen: 9 mm low-attenuation lesion in the upper anterior spleen, unchanged from 10/08/2023 and not hypermetabolic on 11/20/2023. Adrenals/Urinary Tract: Adrenal glands are unremarkable. Scattered renal cortical scarring. Low-attenuation lesions in the kidneys. No specific follow-up necessary. Ureters are decompressed. Bladder is grossly unremarkable. Stomach/Bowel: Tiny hiatal hernia. Stomach, small bowel, appendix and colon are otherwise unremarkable. Vascular/Lymphatic: Atherosclerotic calcification of the aorta. Attenuated left external iliac artery with distal reconstitution. No pathologically enlarged lymph nodes. Reproductive: Calcified uterine fibroids.  No adnexal mass. Other: Small bilateral inguinal hernias contain fat. No free fluid. Mesenteries and peritoneum are unremarkable. Musculoskeletal: Thoracolumbar posterior fusion. No worrisome lytic or sclerotic lesions. IMPRESSION: 1. Developing post radiation scarring in the medial right hemithorax. T8 metastasis, as on PET 11/20/2023. No evidence of disease progression. 2. Previously seen bilateral lower lobe peribronchovascular ground-glass has resolved in the interval, favoring an infectious/inflammatory etiology.  3. Bilateral thyroid  nodules. Recommend thyroid  ultrasound, as clinically indicated. (Ref: J Am Coll Radiol. 2015 Feb;12(2): 143-50). 4. Liver appears mildly steatotic. 5. Aortic atherosclerosis (ICD10-I70.0). Attenuated left external iliac artery with distal reconstitution. 6.  Emphysema (ICD10-J43.9). Electronically Signed   By: Newell Eke M.D.   On: 02/17/2024 09:21   VAS US  LOWER EXTREMITY VENOUS (DVT) Result Date: 01/29/2024  Lower Venous DVT Study Patient Name:  WALLY BEHAN  Date of Exam:   01/27/2024 Medical Rec #: [id number]         Accession #:    [id number] Date of Birth: 1945/12/28         Patient Gender: F Patient Age:   53 years Exam Location:  [address] Procedure:      VAS US  LOWER EXTREMITY VENOUS (DVT) Referring Phys: CALTON Aunisty Reali --------------------------------------------------------------------------------  Indications: Pain, and Swelling.  Risk Factors: Trauma Knee injury 3 weeks prior past pregnancy and obesity. Limitations: Poor ultrasound/tissue interface. Comparison Study: None. Performing Technologist: [name] Rockers  Examination Guidelines: A complete evaluation includes B-mode imaging, spectral Doppler, color Doppler, and power Doppler as needed of all accessible portions of each vessel. Bilateral testing is considered an integral part of a complete examination. Limited examinations for reoccurring indications may be performed as noted. The reflux portion of the exam is performed with the patient in reverse Trendelenburg.  +---------+---------------+---------+-----------+----------+--------------+ RIGHT    CompressibilityPhasicitySpontaneityPropertiesThrombus Aging +---------+---------------+---------+-----------+----------+--------------+ CFV  Full           Yes      Yes                                 +---------+---------------+---------+-----------+----------+--------------+ SFJ      Full                                                         +---------+---------------+---------+-----------+----------+--------------+ FV Prox  Full                                                        +---------+---------------+---------+-----------+----------+--------------+ FV Mid   Full           Yes      Yes                                 +---------+---------------+---------+-----------+----------+--------------+ FV DistalFull                                                        +---------+---------------+---------+-----------+----------+--------------+ PFV      Full                                                        +---------+---------------+---------+-----------+----------+--------------+ POP      Full           Yes      Yes                                 +---------+---------------+---------+-----------+----------+--------------+ PTV      Full                    Yes                                 +---------+---------------+---------+-----------+----------+--------------+ PERO     Full                    Yes                                 +---------+---------------+---------+-----------+----------+--------------+   +----+---------------+---------+-----------+----------+--------------+ LEFTCompressibilityPhasicitySpontaneityPropertiesThrombus Aging +----+---------------+---------+-----------+----------+--------------+ CFV Full           Yes      Yes                                 +----+---------------+---------+-----------+----------+--------------+    Summary: RIGHT: - There is no evidence of deep vein thrombosis in  the lower extremity.  - No cystic structure found in the popliteal fossa.  LEFT: - No evidence of common femoral vein obstruction.   *See table(s) above for measurements and observations. Electronically signed by Debby Robertson on 01/29/2024 at 4:27:25 PM.    Final      ASSESSMENT/PLAN:  This is a very pleasant 78 year old African-American female with metastatic lung  cancer, initially diagnosed as stage IIIa (T1c, N2, M0) non-small cell lung cancer, adenocarcinoma. She presented with a right lower lobe lung nodule in addition to right paratracheal lymphadenopathy. She was diagnosed in February 2025. Her molecular studies by [email or web address] showed no actionable mutations and a negative PD-L1 expression.    The patient has a history of rheumatoid arthritis and is currently on treatment with Plaquenil .   The patient underwent a course of concurrent chemoradiation with weekly carboplatin  for an AUC of 2 and paclitaxel  45 mg/m.  The patient's first dose was on 05/04/2023.  She is status post 7 cycles of treatment.  The last dose was on 06/15/2023.   She then underwent consolidation treatment with immunotherapy with Imfinzi  1500 mg IV every 4 weeks. She was status post 5 cycles.    She was found to have disease progression with  new nodular FDG avid lesions in bilateral lower lobes consistent with metastasis as well as interval increase in pleural effusion. 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    Therefore, Dr. Sherrod started him on chemotherapy with carboplatin  for an AUC of 5, Alimta  500 mg/m, and Avastin  1500 mg/kg IV every 3 weeks.  She is status post 3 cycles.   She also receives Xgeva  every 3 months starting 01/27/2024   Labs were reviewed.  ANC is 1.0 today.  We will delay her treatment by 1 week to allow more recovery time for her neutrophil count.  She denies any signs and symptoms of infection at this time.  The patient was seen with Dr. Sherrod today.  Dr. Sherrod personally and independently reviewed the scan and discussed results with the patient today.  The scan showed no evidence for disease progression.  Dr. Sherrod recommends she continue on the same treatment at the same dose.  However we will delay her treatment by 1 week.  Typically need to hold the Avastin  if her urine protein is greater or equal to  300.     Facial rash (improved at this time) Scaly appearance per patient report.   Let the patient know that she receives B12 injections every 3 cycles of treatment.  The patient was advised to call immediately if she has any concerning symptoms in the interval. The patient voices understanding of current disease status and treatment options and is in agreement with the current care plan. All questions were answered. The patient knows to call the clinic with any problems, questions or concerns. We can certainly see the patient much sooner if necessary   No orders of the defined types were placed in this encounter.    I spent {CHL ONC TIME VISIT - DTPQU:[id number]} counseling the patient face to face. The total time spent in the appointment was {CHL ONC TIME VISIT - DTPQU:[id number]}.  Rita Vialpando L Raylen Ken, PA-C 02/17/2024

## 2024-02-18 ENCOUNTER — Other Ambulatory Visit: Payer: Self-pay | Admitting: Family Medicine

## 2024-02-18 ENCOUNTER — Ambulatory Visit: Admitting: Orthopedic Surgery

## 2024-02-18 ENCOUNTER — Other Ambulatory Visit: Payer: Self-pay

## 2024-02-18 DIAGNOSIS — Z981 Arthrodesis status: Secondary | ICD-10-CM | POA: Diagnosis not present

## 2024-02-18 NOTE — Progress Notes (Signed)
 Orthopedic Surgery Office Visit   Procedure: L2/3 and L3/4 XLIF, L1-4 laminectomies, T10-pelvis PSIF Date of Surgery: 07/29/2022 (~1.5 years post-op)   Assessment: Patient is a 78 y.o. who is doing well after surgery     Plan: -No spine specific restrictions -Activity as tolerated -Pain control: OTC medications -Return to office in 6 months, x-rays needed at next visit: AP/lateral/flex/ex lumbar   ___________________________________________________________________________     Subjective: Patient states that she has been doing well.  She is not having any back pain or radiating leg pain.  She is getting around with a cane and sometimes for longer distances a walker.  She is pleased with how she has been doing since surgery.  Objective:   General: no acute distress, appropriate affect Neurologic: alert, answering questions appropriately, following commands Respiratory: unlabored breathing on room air Skin: incisions are well healed   MSK (spine):   -Strength exam                                                   Left                  Right   EHL                              5/5                  5/5 TA                                 5/5                  5/5 GSC                             5/5                  5/5 Knee extension            5/5                  5/5 Hip flexion                    4/5                  5/5   -Sensory exam                           Sensation intact to light touch in L3-S1 nerve distributions of bilateral lower extremities (decreased in left anterior thigh)    Imaging: XRs of the lumbar spine from 02/18/2024 were independently reviewed and interpreted, showing posterior spinal instrumentation from T10 to the pelvis.  No lucencies seen around the screws.  No screws are backed out.  No evidence of instability on flexion/extension views.  There are interbody devices from L2/3 to L5/S1.  No lucency seen around the interbody's.  Interbody's appear in  appropriate position.  No fracture or dislocation seen.      Patient name: Wendy [name] Patient MRN: [id number] Date of visit: 02/18/2024

## 2024-02-19 ENCOUNTER — Other Ambulatory Visit: Payer: Self-pay

## 2024-02-19 ENCOUNTER — Encounter: Payer: Self-pay | Admitting: Internal Medicine

## 2024-02-23 ENCOUNTER — Encounter: Payer: Self-pay | Admitting: Internal Medicine

## 2024-02-23 MED FILL — Fosaprepitant Dimeglumine For IV Infusion 150 MG (Base Eq): INTRAVENOUS | Qty: 5 | Status: AC

## 2024-02-24 ENCOUNTER — Inpatient Hospital Stay

## 2024-02-24 VITALS — BP 132/81 | HR 86 | Temp 98.2°F | Resp 16 | Ht 63.0 in | Wt 188.5 lb

## 2024-02-24 DIAGNOSIS — Z5112 Encounter for antineoplastic immunotherapy: Secondary | ICD-10-CM | POA: Diagnosis not present

## 2024-02-24 DIAGNOSIS — C3431 Malignant neoplasm of lower lobe, right bronchus or lung: Secondary | ICD-10-CM

## 2024-02-24 LAB — CBC WITH DIFFERENTIAL (CANCER CENTER ONLY)
Abs Immature Granulocytes: 0.04 K/uL (ref 0.00–0.07)
Basophils Absolute: 0 K/uL (ref 0.0–0.1)
Basophils Relative: 0 %
Eosinophils Absolute: 0 K/uL (ref 0.0–0.5)
Eosinophils Relative: 1 %
HCT: 34.4 % — ABNORMAL LOW (ref 36.0–46.0)
Hemoglobin: 10.6 g/dL — ABNORMAL LOW (ref 12.0–15.0)
Immature Granulocytes: 1 %
Lymphocytes Relative: 23 %
Lymphs Abs: 0.7 K/uL (ref 0.7–4.0)
MCH: 25 pg — ABNORMAL LOW (ref 26.0–34.0)
MCHC: 30.8 g/dL (ref 30.0–36.0)
MCV: 81.1 fL (ref 80.0–100.0)
Monocytes Absolute: 0.8 K/uL (ref 0.1–1.0)
Monocytes Relative: 29 %
Neutro Abs: 1.3 K/uL — ABNORMAL LOW (ref 1.7–7.7)
Neutrophils Relative %: 46 %
Platelet Count: 135 K/uL — ABNORMAL LOW (ref 150–400)
RBC: 4.24 MIL/uL (ref 3.87–5.11)
RDW: 22.3 % — ABNORMAL HIGH (ref 11.5–15.5)
WBC Count: 2.8 K/uL — ABNORMAL LOW (ref 4.0–10.5)
nRBC: 0 % (ref 0.0–0.2)

## 2024-02-24 LAB — CMP (CANCER CENTER ONLY)
ALT: 16 U/L (ref 0–44)
AST: 21 U/L (ref 15–41)
Albumin: 3.8 g/dL (ref 3.5–5.0)
Alkaline Phosphatase: 89 U/L (ref 38–126)
Anion gap: 8 (ref 5–15)
BUN: 19 mg/dL (ref 8–23)
CO2: 26 mmol/L (ref 22–32)
Calcium: 8.5 mg/dL — ABNORMAL LOW (ref 8.9–10.3)
Chloride: 105 mmol/L (ref 98–111)
Creatinine: 1 mg/dL (ref 0.44–1.00)
GFR, Estimated: 58 mL/min — ABNORMAL LOW
Glucose, Bld: 183 mg/dL — ABNORMAL HIGH (ref 70–99)
Potassium: 5 mmol/L (ref 3.5–5.1)
Sodium: 139 mmol/L (ref 135–145)
Total Bilirubin: 0.4 mg/dL (ref 0.0–1.2)
Total Protein: 7.2 g/dL (ref 6.5–8.1)

## 2024-02-24 MED ORDER — SODIUM CHLORIDE 0.9 % IV SOLN
500.0000 mg/m2 | Freq: Once | INTRAVENOUS | Status: AC
  Administered 2024-02-24: 1000 mg via INTRAVENOUS
  Filled 2024-02-24: qty 40

## 2024-02-24 MED ORDER — SODIUM CHLORIDE 0.9 % IV SOLN
150.0000 mg | Freq: Once | INTRAVENOUS | Status: AC
  Administered 2024-02-24: 150 mg via INTRAVENOUS
  Filled 2024-02-24: qty 5
  Filled 2024-02-24: qty 150

## 2024-02-24 MED ORDER — DEXAMETHASONE SOD PHOSPHATE PF 10 MG/ML IJ SOLN
10.0000 mg | Freq: Once | INTRAMUSCULAR | Status: AC
  Administered 2024-02-24: 10 mg via INTRAVENOUS

## 2024-02-24 MED ORDER — SODIUM CHLORIDE 0.9 % IV SOLN
15.0000 mg/kg | Freq: Once | INTRAVENOUS | Status: AC
  Administered 2024-02-24: 1200 mg via INTRAVENOUS
  Filled 2024-02-24: qty 48

## 2024-02-24 MED ORDER — SODIUM CHLORIDE 0.9 % IV SOLN: INTRAVENOUS | Status: DC

## 2024-02-24 MED ORDER — PALONOSETRON HCL INJECTION 0.25 MG/5ML
0.2500 mg | Freq: Once | INTRAVENOUS | Status: AC
  Administered 2024-02-24: 0.25 mg via INTRAVENOUS
  Filled 2024-02-24: qty 5

## 2024-02-24 MED ORDER — SODIUM CHLORIDE 0.9 % IV SOLN
438.5000 mg | Freq: Once | INTRAVENOUS | Status: AC
  Administered 2024-02-24: 440 mg via INTRAVENOUS
  Filled 2024-02-24: qty 44

## 2024-02-24 NOTE — Patient Instructions (Signed)
 [facility] - A DEPT OF [facility]  Discharge Instructions: Thank you for choosing [facility] to provide your oncology and hematology care.   If you have a lab appointment with the Cancer Center, please go directly to the Cancer Center and check in at the registration area.   Wear comfortable clothing and clothing appropriate for easy access to any Portacath or PICC line.   We strive to give you quality time with your provider. You may need to reschedule your appointment if you arrive late (15 or more minutes).  Arriving late affects you and other patients whose appointments are after yours.  Also, if you miss three or more appointments without notifying the office, you may be dismissed from the clinic at the provider's discretion.      For prescription refill requests, have your pharmacy contact our office and allow 72 hours for refills to be completed.    Today you received the following chemotherapy and/or immunotherapy agents: Bevacizumab , Alimta , Carboplatin       To help prevent nausea and vomiting after your treatment, we encourage you to take your nausea medication as directed.  BELOW ARE SYMPTOMS THAT SHOULD BE REPORTED IMMEDIATELY: *FEVER GREATER THAN 100.4 F (38 C) OR HIGHER *CHILLS OR SWEATING *NAUSEA AND VOMITING THAT IS NOT CONTROLLED WITH YOUR NAUSEA MEDICATION *UNUSUAL SHORTNESS OF BREATH *UNUSUAL BRUISING OR BLEEDING *URINARY PROBLEMS (pain or burning when urinating, or frequent urination) *BOWEL PROBLEMS (unusual diarrhea, constipation, pain near the anus) TENDERNESS IN MOUTH AND THROAT WITH OR WITHOUT PRESENCE OF ULCERS (sore throat, sores in mouth, or a toothache) UNUSUAL RASH, SWELLING OR PAIN  UNUSUAL VAGINAL DISCHARGE OR ITCHING   Items with * indicate a potential emergency and should be followed up as soon as possible or go to the Emergency Department if any problems should occur.  Please show the CHEMOTHERAPY ALERT  CARD or IMMUNOTHERAPY ALERT CARD at check-in to the Emergency Department and triage nurse.  Should you have questions after your visit or need to cancel or reschedule your appointment, please contact [facility] - A DEPT OF [facility]  Dept: [phone]  and follow the prompts.  Office hours are 8:00 a.m. to 4:30 p.m. Monday - Friday. Please note that voicemails left after 4:00 p.m. may not be returned until the following business day.  We are closed weekends and major holidays. You have access to a nurse at all times for urgent questions. Please call the main number to the clinic Dept: [phone] and follow the prompts.   For any non-urgent questions, you may also contact your provider using MyChart. We now offer e-Visits for anyone 24 and older to request care online for non-urgent symptoms. For details visit [email or web address].   Also download the MyChart app! Go to the app store, search MyChart, open the app, select [facility], and log in with your MyChart username and password.

## 2024-03-01 ENCOUNTER — Other Ambulatory Visit: Payer: Self-pay

## 2024-03-01 DIAGNOSIS — C3431 Malignant neoplasm of lower lobe, right bronchus or lung: Secondary | ICD-10-CM

## 2024-03-02 ENCOUNTER — Inpatient Hospital Stay: Admitting: Physician Assistant

## 2024-03-02 ENCOUNTER — Inpatient Hospital Stay

## 2024-03-02 DIAGNOSIS — Z5112 Encounter for antineoplastic immunotherapy: Secondary | ICD-10-CM | POA: Diagnosis not present

## 2024-03-02 DIAGNOSIS — C3431 Malignant neoplasm of lower lobe, right bronchus or lung: Secondary | ICD-10-CM

## 2024-03-02 LAB — CBC WITH DIFFERENTIAL (CANCER CENTER ONLY)
Abs Immature Granulocytes: 0.03 K/uL (ref 0.00–0.07)
Basophils Absolute: 0 K/uL (ref 0.0–0.1)
Basophils Relative: 0 %
Eosinophils Absolute: 0 K/uL (ref 0.0–0.5)
Eosinophils Relative: 0 %
HCT: 32.9 % — ABNORMAL LOW (ref 36.0–46.0)
Hemoglobin: 10.3 g/dL — ABNORMAL LOW (ref 12.0–15.0)
Immature Granulocytes: 1 %
Lymphocytes Relative: 12 %
Lymphs Abs: 0.6 K/uL — ABNORMAL LOW (ref 0.7–4.0)
MCH: 25.2 pg — ABNORMAL LOW (ref 26.0–34.0)
MCHC: 31.3 g/dL (ref 30.0–36.0)
MCV: 80.4 fL (ref 80.0–100.0)
Monocytes Absolute: 1.1 K/uL — ABNORMAL HIGH (ref 0.1–1.0)
Monocytes Relative: 24 %
Neutro Abs: 3 K/uL (ref 1.7–7.7)
Neutrophils Relative %: 63 %
Platelet Count: 116 K/uL — ABNORMAL LOW (ref 150–400)
RBC: 4.09 MIL/uL (ref 3.87–5.11)
RDW: 20.9 % — ABNORMAL HIGH (ref 11.5–15.5)
WBC Count: 4.7 K/uL (ref 4.0–10.5)
nRBC: 0 % (ref 0.0–0.2)

## 2024-03-02 LAB — CMP (CANCER CENTER ONLY)
ALT: 16 U/L (ref 0–44)
AST: 20 U/L (ref 15–41)
Albumin: 3.6 g/dL (ref 3.5–5.0)
Alkaline Phosphatase: 83 U/L (ref 38–126)
Anion gap: 9 (ref 5–15)
BUN: 15 mg/dL (ref 8–23)
CO2: 25 mmol/L (ref 22–32)
Calcium: 8.7 mg/dL — ABNORMAL LOW (ref 8.9–10.3)
Chloride: 105 mmol/L (ref 98–111)
Creatinine: 0.89 mg/dL (ref 0.44–1.00)
GFR, Estimated: >60 mL/min
Glucose, Bld: 258 mg/dL — ABNORMAL HIGH (ref 70–99)
Potassium: 4.6 mmol/L (ref 3.5–5.1)
Sodium: 138 mmol/L (ref 135–145)
Total Bilirubin: 0.5 mg/dL (ref 0.0–1.2)
Total Protein: 7.2 g/dL (ref 6.5–8.1)

## 2024-03-09 ENCOUNTER — Inpatient Hospital Stay

## 2024-03-09 ENCOUNTER — Inpatient Hospital Stay: Admitting: Internal Medicine

## 2024-03-09 ENCOUNTER — Inpatient Hospital Stay: Attending: Internal Medicine

## 2024-03-09 DIAGNOSIS — Z886 Allergy status to analgesic agent status: Secondary | ICD-10-CM | POA: Insufficient documentation

## 2024-03-09 DIAGNOSIS — C7951 Secondary malignant neoplasm of bone: Secondary | ICD-10-CM | POA: Diagnosis not present

## 2024-03-09 DIAGNOSIS — Z9221 Personal history of antineoplastic chemotherapy: Secondary | ICD-10-CM | POA: Diagnosis not present

## 2024-03-09 DIAGNOSIS — Z885 Allergy status to narcotic agent status: Secondary | ICD-10-CM | POA: Diagnosis not present

## 2024-03-09 DIAGNOSIS — R21 Rash and other nonspecific skin eruption: Secondary | ICD-10-CM | POA: Insufficient documentation

## 2024-03-09 DIAGNOSIS — Z981 Arthrodesis status: Secondary | ICD-10-CM | POA: Insufficient documentation

## 2024-03-09 DIAGNOSIS — Z923 Personal history of irradiation: Secondary | ICD-10-CM | POA: Diagnosis not present

## 2024-03-09 DIAGNOSIS — Z5112 Encounter for antineoplastic immunotherapy: Secondary | ICD-10-CM | POA: Insufficient documentation

## 2024-03-09 DIAGNOSIS — Z8619 Personal history of other infectious and parasitic diseases: Secondary | ICD-10-CM | POA: Insufficient documentation

## 2024-03-09 DIAGNOSIS — R0609 Other forms of dyspnea: Secondary | ICD-10-CM | POA: Diagnosis not present

## 2024-03-09 DIAGNOSIS — I10 Essential (primary) hypertension: Secondary | ICD-10-CM | POA: Diagnosis not present

## 2024-03-09 DIAGNOSIS — Z9849 Cataract extraction status, unspecified eye: Secondary | ICD-10-CM | POA: Diagnosis not present

## 2024-03-09 DIAGNOSIS — Z9226 Personal history of immune checkpoint inhibitor therapy: Secondary | ICD-10-CM | POA: Diagnosis not present

## 2024-03-09 DIAGNOSIS — C3431 Malignant neoplasm of lower lobe, right bronchus or lung: Secondary | ICD-10-CM | POA: Diagnosis present

## 2024-03-09 DIAGNOSIS — Z79899 Other long term (current) drug therapy: Secondary | ICD-10-CM | POA: Insufficient documentation

## 2024-03-09 DIAGNOSIS — M069 Rheumatoid arthritis, unspecified: Secondary | ICD-10-CM | POA: Insufficient documentation

## 2024-03-09 LAB — CBC WITH DIFFERENTIAL (CANCER CENTER ONLY)
Abs Immature Granulocytes: 0.02 K/uL (ref 0.00–0.07)
Basophils Absolute: 0 K/uL (ref 0.0–0.1)
Basophils Relative: 0 %
Eosinophils Absolute: 0 K/uL (ref 0.0–0.5)
Eosinophils Relative: 0 %
HCT: 33.7 % — ABNORMAL LOW (ref 36.0–46.0)
Hemoglobin: 10.6 g/dL — ABNORMAL LOW (ref 12.0–15.0)
Immature Granulocytes: 1 %
Lymphocytes Relative: 19 %
Lymphs Abs: 0.6 K/uL — ABNORMAL LOW (ref 0.7–4.0)
MCH: 25.3 pg — ABNORMAL LOW (ref 26.0–34.0)
MCHC: 31.5 g/dL (ref 30.0–36.0)
MCV: 80.4 fL (ref 80.0–100.0)
Monocytes Absolute: 0.6 K/uL (ref 0.1–1.0)
Monocytes Relative: 20 %
Neutro Abs: 1.9 K/uL (ref 1.7–7.7)
Neutrophils Relative %: 60 %
Platelet Count: 133 K/uL — ABNORMAL LOW (ref 150–400)
RBC: 4.19 MIL/uL (ref 3.87–5.11)
RDW: 21 % — ABNORMAL HIGH (ref 11.5–15.5)
WBC Count: 3.1 K/uL — ABNORMAL LOW (ref 4.0–10.5)
nRBC: 0 % (ref 0.0–0.2)

## 2024-03-09 LAB — CMP (CANCER CENTER ONLY)
ALT: 17 U/L (ref 0–44)
AST: 20 U/L (ref 15–41)
Albumin: 3.8 g/dL (ref 3.5–5.0)
Alkaline Phosphatase: 84 U/L (ref 38–126)
Anion gap: 9 (ref 5–15)
BUN: 16 mg/dL (ref 8–23)
CO2: 26 mmol/L (ref 22–32)
Calcium: 9.3 mg/dL (ref 8.9–10.3)
Chloride: 103 mmol/L (ref 98–111)
Creatinine: 0.96 mg/dL (ref 0.44–1.00)
GFR, Estimated: >60 mL/min
Glucose, Bld: 335 mg/dL — ABNORMAL HIGH (ref 70–99)
Potassium: 4.5 mmol/L (ref 3.5–5.1)
Sodium: 138 mmol/L (ref 135–145)
Total Bilirubin: 0.4 mg/dL (ref 0.0–1.2)
Total Protein: 7.5 g/dL (ref 6.5–8.1)

## 2024-03-09 NOTE — Progress Notes (Signed)
 [facility] OFFICE PROGRESS NOTE  Wendy [name] LABOR, MD [address] [place] [address]  DIAGNOSIS:  1) Metastatic non-small cell lung cancer, adenocarcinoma initially diagnosed as stage IIIa (T1c, N2, M0) non-small cell lung cancer, adenocarcinoma presented with right lower lobe lung nodule in addition to right paratracheal lymphadenopathy diagnosed in February 2025. The patient has evidence for disease progression with bilateral pulmonary nodules in addition to bone metastasis in September 2025.    Molecular studies by [email or web address] showed no actionable mutations and negative PD-L1 expression.   2) history of rheumatoid arthritis and currently on treatment with Plaquenil .  PRIOR THERAPY: 1) A course of concurrent chemoradiation with weekly carboplatin  for AUC of 2 and paclitaxel  45 Mg/M2.  First dose May 04, 2023.  Status post 7 cycles.  Last dose was given on 06/15/2023. 2) Consolidation treatment with immunotherapy with Imfinzi  1500 Mg IV every 4 weeks status post 5 cycles.  Last dose was given 11/10/2023 discontinued secondary to disease progression.  CURRENT THERAPY: 1) Systemic chemotherapy with carboplatin  for AUC of 5, Alimta  500 mg/M2 and Avastin  15 MGs/KG every 3 weeks.  First dose 12/03/2023.  Status post 4 cycles. 2) Xgeva  120 mg subcutaneous every 3 months start January 27, 2024.  INTERVAL HISTORY: Wendy [name] 79 y.o. female returns to the clinic today visit for a follow-up visit accompanied by her daughter. The patient was last seen by myself on 02/17/24.    She denies any major changes in her health since she was seen.  He has some stable fatigue.  She also previously had a rash on her face in which her PCP gave her Bactroban .  Her rash had improved.    She denies any fever, chills, or signs of infection such as upper respiratory infection, burning with urination, diarrhea, nausea, or vomiting. She reports mild stable dyspnea on exertion which is  unchanged. Denies any significant cough, chest pain, or hemoptysis. Denies any unusual diarrhea or constipation. She denies any headache or visual changes. She is here today for evaluation and repeat blood work before undergoing cycle #5   MEDICAL HISTORY: Past Medical History:  Diagnosis Date   Allergy    year around   Anemia    long time ago    Anxiety    Arthritis    RA    Cataract    removed  both dr effie-    Complication of anesthesia    DEPENDENT EDEMA 07/21/2007   DIABETES MELLITUS, TYPE II 11/05/2006   FOOT PAIN 05/23/2009   GERD 11/20/2006   Hepatitis    HEPATITIS C 02/08/2007   sees GI @ [facility]   History of radiation therapy    Thoracic Spine-12/14/23-12/25/23- Dr. 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 Jon Jacob     ALLERGIES:  is allergic to aspirin and tramadol.  MEDICATIONS:  Current Outpatient Medications  Medication Sig Dispense Refill   Accu-Chek Softclix Lancets lancets Test once per day and diagnosis code is E 11.9 and dispense for (ACCU-CHEK Soft Touch) 100 each 12   ALPRAZolam  (XANAX ) 1 MG tablet TAKE 1 TABLET (1 MG TOTAL) BY MOUTH AT BEDTIME AS NEEDED FOR ANXIETY OR SLEEP. 30 tablet 5   amLODipine  (  NORVASC ) 10 MG tablet Take 1 tablet (10 mg total) by mouth daily. 90 tablet 3   atorvastatin  (LIPITOR) 10 MG tablet Take 1 tablet (10 mg total) by mouth daily. 90 tablet 3   Blood Glucose Monitoring Suppl (ACCU-CHEK GUIDE) w/Device KIT Use to test sugars daily. 1 kit 0   Blood Glucose Monitoring Suppl DEVI 1 each by Does not apply route in the morning, at noon, and at bedtime. May substitute to any manufacturer covered by patient's insurance. 1 each 0   Blood Pressure Monitor KIT Use to check blood  pressure. 1 kit 0   Cholecalciferol (VITAMIN D3) 20 MCG (800 UNIT) TABS Take 800 Units by mouth daily.     dexamethasone  (DECADRON ) 4 MG tablet Take 1 tab by mouth 2 times daily starting day before pemetrexed . Then take 2 tabs daily x 3 days starting day after chemo. Take with food. 30 tablet 1   DULoxetine  (CYMBALTA ) 30 MG capsule Take 1 capsule (30 mg total) by mouth at bedtime.     fluticasone  (FLONASE ) 50 MCG/ACT nasal spray SPRAY 2 SPRAYS INTO EACH NOSTRIL EVERY DAY 48 mL 3   folic acid  (FOLVITE ) 1 MG tablet Take 1 tablet (1 mg total) by mouth daily. Start 7 days before pemetrexed  chemotherapy. Continue until 21 days after pemetrexed  completed. 100 tablet 3   furosemide  (LASIX ) 20 MG tablet Take 1 tablet (20 mg total) by mouth daily as needed (fluid retention). 90 tablet 3   Ginkgo Biloba (GNP GINGKO BILOBA EXTRACT PO) Take by mouth.     glipiZIDE  (GLUCOTROL ) 5 MG tablet Take 1 tablet (5 mg total) by mouth 2 (two) times daily before a meal. 180 tablet 3   glucose blood (ACCU-CHEK GUIDE TEST) test strip Use as instructed 100 strip 1   hydroxychloroquine  (PLAQUENIL ) 200 MG tablet Take 1 tablet (200 mg total) by mouth daily. 90 tablet 1   insulin  aspart (NOVOLOG  FLEXPEN) 100 UNIT/ML FlexPen Inject 5 Units into the skin 3 (three) times daily with meals. Plus sliding scale , maximum 20 units/day. 15 mL 4   insulin  degludec (TRESIBA  FLEXTOUCH) 100 UNIT/ML FlexTouch Pen Inject 20 Units into the skin daily. 15 mL 4   Insulin  Pen Needle 32G X 4 MM MISC Use 4x a day 300 each 3   Lancets (ACCU-CHEK SOFT TOUCH) lancets Test once per day and diagnosis code is E 11.9 and dispense for Aviva plus 100 each 3   LANTUS  SOLOSTAR 100 UNIT/ML Solostar Pen Inject 25 Units into the skin at bedtime.     levocetirizine (XYZAL ) 5 MG tablet TAKE 1 TABLET BY MOUTH EVERY DAY IN THE EVENING 90 tablet 3   levocetirizine (XYZAL ) 5 MG tablet Take 1 tablet (5 mg total) by mouth every evening. 90 tablet 3   lidocaine -prilocaine   (EMLA ) cream Apply to affected area once 30 g 3   metoCLOPramide  (REGLAN ) 10 MG tablet Take 1 tablet (10 mg total) by mouth 3 (three) times daily with meals for 5 days. 15 tablet 0   mupirocin  ointment (BACTROBAN ) 2 % Apply 1 Application topically 2 (two) times daily. 22 g 5   omeprazole  (PRILOSEC) 40 MG capsule TAKE 1 CAPSULE BY MOUTH EVERY DAY 90 capsule 3   ondansetron  (ZOFRAN ) 4 MG tablet Take 1 tablet (4 mg total) by mouth every 8 (eight) hours as needed for nausea or vomiting. 90 tablet 3   ondansetron  (ZOFRAN ) 8 MG tablet Take 1 tablet (8 mg total) by mouth every 8 (eight) hours as needed  for nausea or vomiting. Start on the third day after chemotherapy. 30 tablet 1   oxyCODONE -acetaminophen  (PERCOCET) 10-325 MG tablet Take 1 tablet by mouth every 6 (six) hours as needed for pain. 120 tablet 0   oxyCODONE -acetaminophen  (PERCOCET) 10-325 MG tablet Take 1 tablet by mouth every 6 (six) hours as needed for pain. 120 tablet 0   oxyCODONE -acetaminophen  (PERCOCET) 10-325 MG tablet Take 1 tablet by mouth every 6 (six) hours as needed for pain. 120 tablet 0   polyethylene glycol (MIRALAX  / GLYCOLAX ) 17 g packet Take 17 g by mouth daily as needed for mild constipation.     potassium chloride  (KLOR-CON ) 10 MEQ tablet TAKE 1 TABLET BY MOUTH EVERY DAY 90 tablet 3   pregabalin  (LYRICA ) 100 MG capsule TAKE 1 CAPSULE BY MOUTH AT BEDTIME. 90 capsule 1   prochlorperazine  (COMPAZINE ) 10 MG tablet Take 1 tablet (10 mg total) by mouth every 6 (six) hours as needed for nausea or vomiting. 30 tablet 1   RESTASIS  0.05 % ophthalmic emulsion Place 1 drop into both eyes 2 (two) times daily.     sitaGLIPtin  (JANUVIA ) 100 MG tablet Take 1 tablet (100 mg total) by mouth at bedtime. 90 tablet 3   sucralfate  (CARAFATE ) 1 g tablet Take 1 tablet (1 g total) by mouth 4 (four) times daily. Dissolve each tablet in 15 cc water before use. 120 tablet 2   No current facility-administered medications for this visit.    SURGICAL  HISTORY:  Past Surgical History:  Procedure Laterality Date   BRONCHIAL BRUSHINGS  04/07/2023   Procedure: BRONCHIAL BRUSHINGS;  Surgeon: Shelah Lamar RAMAN, MD;  Location: [facility] ENDOSCOPY;  Service: Pulmonary;;   BRONCHIAL NEEDLE ASPIRATION BIOPSY  04/07/2023   Procedure: BRONCHIAL NEEDLE ASPIRATION BIOPSIES;  Surgeon: Shelah Lamar RAMAN, MD;  Location: [facility] ENDOSCOPY;  Service: Pulmonary;;   CATARACT EXTRACTION     COLONOSCOPY  11/23/2019   per Dr. Teressa, adenmatous polyp, repeat in 7 yrs   ESOPHAGOGASTRODUODENOSCOPY  06/20/2008   at [facility], clear    FINE NEEDLE ASPIRATION  04/07/2023   Procedure: FINE NEEDLE ASPIRATION (FNA) LINEAR;  Surgeon: Shelah Lamar RAMAN, MD;  Location: [facility] ENDOSCOPY;  Service: Pulmonary;;   IR IMAGING GUIDED PORT INSERTION  04/30/2023   LUMBAR LAMINECTOMY     1995   ROTATOR CUFF REPAIR Right    SPINAL FUSION N/A 07/29/2022   Procedure: T10-PELVIS INSTRUMENTATION/ T10-L4 POSTERIOR SPINAL FUSION;  Surgeon: Georgina Ozell LABOR, MD;  Location: [facility] OR;  Service: Orthopedics;  Laterality: N/A;   UPPER GASTROINTESTINAL ENDOSCOPY  2010   [facility]    VIDEO BRONCHOSCOPY WITH ENDOBRONCHIAL ULTRASOUND N/A 04/07/2023   Procedure: VIDEO BRONCHOSCOPY WITH ENDOBRONCHIAL ULTRASOUND;  Surgeon: Shelah Lamar RAMAN, MD;  Location: [facility] ENDOSCOPY;  Service: Pulmonary;  Laterality: N/A;    REVIEW OF SYSTEMS:   Review of Systems  Constitutional: Stable fatigue. Negative for appetite change, chills, fatigue, fever and unexpected weight change.  HENT: Negative for mouth sores, nosebleeds, sore throat and trouble swallowing.   Eyes: Negative for eye problems and icterus.  Respiratory: Positive for stable mild dyspnea. Negative for hemoptysis and wheezing.   Cardiovascular: Negative for chest pain. Mild right lower extremity swelling.  Gastrointestinal: Negative for abdominal pain, constipation, diarrhea, nausea and vomiting.  Genitourinary: Negative for bladder incontinence, difficulty urinating, dysuria,  frequency and hematuria.   Musculoskeletal: Negative for back pain, gait problem, neck pain and neck stiffness.  Skin: Mild rash on chin. Negative for itching and rash.  Neurological: Negative for  dizziness, extremity weakness, gait problem, headaches, light-headedness and seizures.  Hematological: Negative for adenopathy. Does not bruise/bleed easily.  Psychiatric/Behavioral: Negative for confusion, depression and sleep disturbance. The patient is not nervous/anxious.     PHYSICAL EXAMINATION:  Blood pressure 136/84, pulse (!) 103, temperature 97.8 F (36.6 C), temperature source Temporal, resp. rate 17, height 5' 3 (1.6 m), weight 183 lb 12.8 oz (83.4 kg), SpO2 100%.  ECOG PERFORMANCE STATUS: 1  Physical Exam  Constitutional: Oriented to person, place, and time and well-developed, well-nourished, and in no distress.  HENT:  Head: Normocephalic and atraumatic.  Mouth/Throat: Oropharynx is clear and moist. No oropharyngeal exudate.  Eyes: Conjunctivae are normal. Right eye exhibits no discharge. Left eye exhibits no discharge. No scleral icterus.  Neck: Normal range of motion. Neck supple.  Cardiovascular: Normal rate, regular rhythm, normal heart sounds and intact distal pulses.   Pulmonary/Chest: Effort normal and breath sounds normal. No respiratory distress. No wheezes. No rales.  Abdominal: Soft. Bowel sounds are normal. Exhibits no distension and no mass. There is no tenderness.  Musculoskeletal: Normal range of motion. Mild right lower extremity swelling.   Lymphadenopathy:    No cervical adenopathy.  Neurological: Alert and oriented to person, place, and time. Exhibits normal muscle tone. Gait normal. Coordination normal.  Skin: Skin is warm and dry. No rash noted. Not diaphoretic. No erythema. No pallor.  Psychiatric: Mood, memory and judgment normal.  Vitals reviewed.  LABORATORY DATA: Lab Results  Component Value Date   WBC 2.9 (L) 03/16/2024   HGB 11.3 (L) 03/16/2024    HCT 35.9 (L) 03/16/2024   MCV 82.0 03/16/2024   PLT 147 (L) 03/16/2024      Chemistry      Component Value Date/Time   NA 141 03/16/2024 0815   K 3.5 03/16/2024 0815   CL 104 03/16/2024 0815   CO2 23 03/16/2024 0815   BUN 13 03/16/2024 0815   CREATININE 1.04 (H) 03/16/2024 0815   CREATININE 1.07 (H) 09/16/2019 1511      Component Value Date/Time   CALCIUM  9.2 03/16/2024 0815   ALKPHOS 80 03/16/2024 0815   AST 23 03/16/2024 0815   ALT 17 03/16/2024 0815   BILITOT 0.7 03/16/2024 0815       RADIOGRAPHIC STUDIES:  XR Lumbar Spine Complete Result Date: 02/18/2024 XRs of the lumbar spine from 02/18/2024 were independently reviewed and interpreted, showing posterior spinal instrumentation from T10 to the pelvis.  No lucencies seen around the screws.  No screws are backed out.  No evidence of instability on flexion/extension views.  There are interbody devices from L2/3 to L5/S1.  No lucency seen around the interbody's.  Interbody's appear in appropriate position.  No fracture or dislocation seen.     ASSESSMENT/PLAN:  This is a very pleasant 79 year old African-American female with metastatic lung cancer, initially diagnosed as stage IIIa (T1c, N2, M0) non-small cell lung cancer, adenocarcinoma. She presented with a right lower lobe lung nodule in addition to right paratracheal lymphadenopathy. She was diagnosed in February 2025. Her molecular studies by [email or web address] showed no actionable mutations and a negative PD-L1 expression.    The patient has a history of rheumatoid arthritis and is currently on treatment with Plaquenil .   The patient underwent a course of concurrent chemoradiation with weekly carboplatin  for an AUC of 2 and paclitaxel  45 mg/m.  The patient's first dose was on 05/04/2023.  She is status post 7 cycles of treatment.  The last dose was on 06/15/2023.   She  then underwent consolidation treatment with immunotherapy with Imfinzi  1500 mg IV every 4 weeks. She was  status post 5 cycles.    She was found to have disease progression with  new nodular FDG avid lesions in bilateral lower lobes consistent with metastasis as well as interval increase in pleural effusion. 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    Therefore, Dr. Sherrod started him on chemotherapy with carboplatin  for an AUC of 5, Alimta  500 mg/m, and Avastin  1500 mg/kg IV every 3 weeks.  She is status post 4 cycles.   She also receives Xgeva  every 3 months starting 01/27/2024   Labs were reviewed.  She is ok to treat with pulse of 103. She will proceed with cycle #5 today as scheduled.   We will see her back for labs and follow up in 3 weeks before undergoing cycle #6.   Typically need to hold the Avastin  if her urine protein is greater or equal to 300.    The patient was advised to call immediately if she has any concerning symptoms in the interval. The patient voices understanding of current disease status and treatment options and is in agreement with the current care plan. All questions were answered. The patient knows to call the clinic with any problems, questions or concerns. We can certainly see the patient much sooner if necessary    No orders of the defined types were placed in this encounter.   The total time spent in the appointment was 20-29 minutes  Kemp Gomes L Daytona Hedman, PA-C 03/16/2024

## 2024-03-10 ENCOUNTER — Ambulatory Visit (INDEPENDENT_AMBULATORY_CARE_PROVIDER_SITE_OTHER): Admitting: Otolaryngology

## 2024-03-14 ENCOUNTER — Ambulatory Visit: Admitting: Endocrinology

## 2024-03-14 ENCOUNTER — Ambulatory Visit: Payer: Self-pay | Admitting: Endocrinology

## 2024-03-14 ENCOUNTER — Encounter: Payer: Self-pay | Admitting: Endocrinology

## 2024-03-14 ENCOUNTER — Encounter: Payer: Self-pay | Admitting: Internal Medicine

## 2024-03-14 VITALS — BP 148/80 | HR 96 | Resp 18 | Ht 63.0 in | Wt 190.0 lb

## 2024-03-14 DIAGNOSIS — Z794 Long term (current) use of insulin: Secondary | ICD-10-CM | POA: Diagnosis not present

## 2024-03-14 DIAGNOSIS — Z7984 Long term (current) use of oral hypoglycemic drugs: Secondary | ICD-10-CM

## 2024-03-14 DIAGNOSIS — E1165 Type 2 diabetes mellitus with hyperglycemia: Secondary | ICD-10-CM

## 2024-03-14 LAB — POCT GLYCOSYLATED HEMOGLOBIN (HGB A1C): Hemoglobin A1C: 9.2 % — AB (ref 4.0–5.6)

## 2024-03-14 MED ORDER — NOVOLOG FLEXPEN 100 UNIT/ML ~~LOC~~ SOPN: 5.0000 [IU] | PEN_INJECTOR | Freq: Three times a day (TID) | SUBCUTANEOUS | 4 refills | Status: AC
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
## 2024-03-14 NOTE — Patient Instructions (Addendum)
" ° °  Continue Januvia  100mg  daily.  Lantus  20 units daily in the morning.   Glipizide  5 mg two times a day.   Take novolog  5 units with meals up to 3 times a day.   Sliding Scale Blood Glucose        Insulin  60-150                     0 unit 151-200                   1 units 201-250                   2 units 251-300                   3 units 301-350                   4 units 351-400                  5 units    >400                       6 units and call provider "

## 2024-03-15 ENCOUNTER — Other Ambulatory Visit: Payer: Self-pay

## 2024-03-15 ENCOUNTER — Encounter: Payer: Self-pay | Admitting: Family Medicine

## 2024-03-15 ENCOUNTER — Other Ambulatory Visit: Payer: Self-pay | Admitting: Physician Assistant

## 2024-03-15 ENCOUNTER — Ambulatory Visit: Admitting: Family Medicine

## 2024-03-15 VITALS — BP 136/92 | HR 96 | Temp 98.0°F | Wt 185.0 lb

## 2024-03-15 DIAGNOSIS — G8929 Other chronic pain: Secondary | ICD-10-CM | POA: Diagnosis not present

## 2024-03-15 DIAGNOSIS — C3431 Malignant neoplasm of lower lobe, right bronchus or lung: Secondary | ICD-10-CM

## 2024-03-15 DIAGNOSIS — M48062 Spinal stenosis, lumbar region with neurogenic claudication: Secondary | ICD-10-CM

## 2024-03-15 MED ORDER — OXYCODONE-ACETAMINOPHEN 10-325 MG PO TABS: 1.0000 | ORAL_TABLET | Freq: Four times a day (QID) | ORAL | 0 refills | Status: AC | PRN

## 2024-03-15 MED ORDER — ATORVASTATIN CALCIUM 10 MG PO TABS: 10.0000 mg | ORAL_TABLET | Freq: Every day | ORAL | 3 refills | Status: AC

## 2024-03-15 MED ORDER — AMLODIPINE BESYLATE 10 MG PO TABS: 10.0000 mg | ORAL_TABLET | Freq: Every day | ORAL | 3 refills | Status: AC

## 2024-03-15 MED FILL — Fosaprepitant Dimeglumine For IV Infusion 150 MG (Base Eq): INTRAVENOUS | Qty: 5 | Status: AC

## 2024-03-15 NOTE — Progress Notes (Signed)
" ° °  Subjective:    Patient ID: Wendy [name], female    DOB: Jul 08, 1945, 79 y.o.   MRN: [id number]  HPI Here for pain management. She is doing about the same. She is getting chemotherapy again for the metastatic lung cancer.    Review of Systems  Constitutional: Negative.   Musculoskeletal:  Positive for back pain.       Objective:   Physical Exam Constitutional:      Comments: Walks with a cane   Neurological:     Mental Status: She is alert.           Assessment & Plan:  Pain management.  Indication for chronic opioid: low back pain Medication and dose: Percocet 10-325 # pills per month: 120 Last UDS date: 12-10-23 Opioid Treatment Agreement signed (Y/N): 06-19-17 Opioid Treatment Agreement last reviewed with patient:  03-15-24 [facility] reviewed this encounter (include red flags): Yes Meds were refilled.  Garnette Olmsted, MD   "

## 2024-03-16 ENCOUNTER — Inpatient Hospital Stay (HOSPITAL_BASED_OUTPATIENT_CLINIC_OR_DEPARTMENT_OTHER): Admitting: Physician Assistant

## 2024-03-16 ENCOUNTER — Inpatient Hospital Stay

## 2024-03-16 VITALS — BP 131/80 | HR 80 | Resp 16

## 2024-03-16 VITALS — BP 136/84 | HR 103 | Temp 97.8°F | Resp 17 | Ht 63.0 in | Wt 183.8 lb

## 2024-03-16 DIAGNOSIS — Z5112 Encounter for antineoplastic immunotherapy: Secondary | ICD-10-CM | POA: Diagnosis not present

## 2024-03-16 DIAGNOSIS — C3431 Malignant neoplasm of lower lobe, right bronchus or lung: Secondary | ICD-10-CM

## 2024-03-16 DIAGNOSIS — Z5111 Encounter for antineoplastic chemotherapy: Secondary | ICD-10-CM

## 2024-03-16 LAB — CBC WITH DIFFERENTIAL (CANCER CENTER ONLY)
Abs Immature Granulocytes: 0.01 K/uL (ref 0.00–0.07)
Basophils Absolute: 0 K/uL (ref 0.0–0.1)
Basophils Relative: 0 %
Eosinophils Absolute: 0 K/uL (ref 0.0–0.5)
Eosinophils Relative: 0 %
HCT: 35.9 % — ABNORMAL LOW (ref 36.0–46.0)
Hemoglobin: 11.3 g/dL — ABNORMAL LOW (ref 12.0–15.0)
Immature Granulocytes: 0 %
Lymphocytes Relative: 17 %
Lymphs Abs: 0.5 K/uL — ABNORMAL LOW (ref 0.7–4.0)
MCH: 25.8 pg — ABNORMAL LOW (ref 26.0–34.0)
MCHC: 31.5 g/dL (ref 30.0–36.0)
MCV: 82 fL (ref 80.0–100.0)
Monocytes Absolute: 0.6 K/uL (ref 0.1–1.0)
Monocytes Relative: 20 %
Neutro Abs: 1.8 K/uL (ref 1.7–7.7)
Neutrophils Relative %: 63 %
Platelet Count: 147 K/uL — ABNORMAL LOW (ref 150–400)
RBC: 4.38 MIL/uL (ref 3.87–5.11)
RDW: 21.7 % — ABNORMAL HIGH (ref 11.5–15.5)
WBC Count: 2.9 K/uL — ABNORMAL LOW (ref 4.0–10.5)
nRBC: 0 % (ref 0.0–0.2)

## 2024-03-16 LAB — CMP (CANCER CENTER ONLY)
ALT: 17 U/L (ref 0–44)
AST: 23 U/L (ref 15–41)
Albumin: 3.8 g/dL (ref 3.5–5.0)
Alkaline Phosphatase: 80 U/L (ref 38–126)
Anion gap: 14 (ref 5–15)
BUN: 13 mg/dL (ref 8–23)
CO2: 23 mmol/L (ref 22–32)
Calcium: 9.2 mg/dL (ref 8.9–10.3)
Chloride: 104 mmol/L (ref 98–111)
Creatinine: 1.04 mg/dL — ABNORMAL HIGH (ref 0.44–1.00)
GFR, Estimated: 55 mL/min — ABNORMAL LOW
Glucose, Bld: 246 mg/dL — ABNORMAL HIGH (ref 70–99)
Potassium: 3.5 mmol/L (ref 3.5–5.1)
Sodium: 141 mmol/L (ref 135–145)
Total Bilirubin: 0.7 mg/dL (ref 0.0–1.2)
Total Protein: 7.5 g/dL (ref 6.5–8.1)

## 2024-03-16 LAB — TOTAL PROTEIN, URINE DIPSTICK: Protein, ur: 100 mg/dL — AB

## 2024-03-16 MED ORDER — FAMOTIDINE IN NACL 20-0.9 MG/50ML-% IV SOLN
20.0000 mg | Freq: Once | INTRAVENOUS | Status: AC
  Administered 2024-03-16: 20 mg via INTRAVENOUS
  Filled 2024-03-16: qty 50

## 2024-03-16 MED ORDER — SODIUM CHLORIDE 0.9 % IV SOLN: INTRAVENOUS | Status: DC

## 2024-03-16 MED ORDER — SODIUM CHLORIDE 0.9 % IV SOLN
15.0000 mg/kg | Freq: Once | INTRAVENOUS | Status: AC
  Administered 2024-03-16: 1200 mg via INTRAVENOUS
  Filled 2024-03-16: qty 48

## 2024-03-16 MED ORDER — SODIUM CHLORIDE 0.9 % IV SOLN
500.0000 mg/m2 | Freq: Once | INTRAVENOUS | Status: AC
  Administered 2024-03-16: 1000 mg via INTRAVENOUS
  Filled 2024-03-16: qty 40

## 2024-03-16 MED ORDER — CETIRIZINE HCL 10 MG/ML IV SOLN
10.0000 mg | Freq: Once | INTRAVENOUS | Status: AC
  Administered 2024-03-16: 10 mg via INTRAVENOUS
  Filled 2024-03-16: qty 1

## 2024-03-16 MED ORDER — PALONOSETRON HCL INJECTION 0.25 MG/5ML
0.2500 mg | Freq: Once | INTRAVENOUS | Status: AC
  Administered 2024-03-16: 0.25 mg via INTRAVENOUS
  Filled 2024-03-16: qty 5

## 2024-03-16 MED ORDER — SODIUM CHLORIDE 0.9% FLUSH: 10.0000 mL | INTRAVENOUS | Status: DC | PRN

## 2024-03-16 MED ORDER — DEXAMETHASONE SOD PHOSPHATE PF 10 MG/ML IJ SOLN
10.0000 mg | Freq: Once | INTRAMUSCULAR | Status: AC
  Administered 2024-03-16: 10 mg via INTRAVENOUS
  Filled 2024-03-16: qty 1

## 2024-03-16 MED ORDER — SODIUM CHLORIDE 0.9 % IV SOLN
150.0000 mg | Freq: Once | INTRAVENOUS | Status: AC
  Administered 2024-03-16: 150 mg via INTRAVENOUS
  Filled 2024-03-16: qty 150

## 2024-03-16 MED ORDER — SODIUM CHLORIDE 0.9 % IV SOLN
438.5000 mg | Freq: Once | INTRAVENOUS | Status: AC
  Administered 2024-03-16: 440 mg via INTRAVENOUS
  Filled 2024-03-16: qty 44

## 2024-03-16 NOTE — Progress Notes (Signed)
 Ok to proceed with urine protein=100 today per Cassie H, PA-C.  Draycen Leichter, PharmD, MBA

## 2024-03-16 NOTE — Progress Notes (Signed)
 IV Cetirizine  and IV Pepcid  added to premeds per protocol d/t pt having received a total of 6 high AUC carbo doses.  Fardowsa Authier, PharmD, MBA

## 2024-03-16 NOTE — Patient Instructions (Signed)
 [facility] - A DEPT OF [facility]  Discharge Instructions: Thank you for choosing [facility] to provide your oncology and hematology care.   If you have a lab appointment with the Cancer Center, please go directly to the Cancer Center and check in at the registration area.   Wear comfortable clothing and clothing appropriate for easy access to any Portacath or PICC line.   We strive to give you quality time with your provider. You may need to reschedule your appointment if you arrive late (15 or more minutes).  Arriving late affects you and other patients whose appointments are after yours.  Also, if you miss three or more appointments without notifying the office, you may be dismissed from the clinic at the provider's discretion.      For prescription refill requests, have your pharmacy contact our office and allow 72 hours for refills to be completed.    Today you received the following chemotherapy and/or immunotherapy agents: Bevacizumab , Alimta , Carboplatin       To help prevent nausea and vomiting after your treatment, we encourage you to take your nausea medication as directed.  BELOW ARE SYMPTOMS THAT SHOULD BE REPORTED IMMEDIATELY: *FEVER GREATER THAN 100.4 F (38 C) OR HIGHER *CHILLS OR SWEATING *NAUSEA AND VOMITING THAT IS NOT CONTROLLED WITH YOUR NAUSEA MEDICATION *UNUSUAL SHORTNESS OF BREATH *UNUSUAL BRUISING OR BLEEDING *URINARY PROBLEMS (pain or burning when urinating, or frequent urination) *BOWEL PROBLEMS (unusual diarrhea, constipation, pain near the anus) TENDERNESS IN MOUTH AND THROAT WITH OR WITHOUT PRESENCE OF ULCERS (sore throat, sores in mouth, or a toothache) UNUSUAL RASH, SWELLING OR PAIN  UNUSUAL VAGINAL DISCHARGE OR ITCHING   Items with * indicate a potential emergency and should be followed up as soon as possible or go to the Emergency Department if any problems should occur.  Please show the CHEMOTHERAPY ALERT  CARD or IMMUNOTHERAPY ALERT CARD at check-in to the Emergency Department and triage nurse.  Should you have questions after your visit or need to cancel or reschedule your appointment, please contact [facility] - A DEPT OF [facility]  Dept: [phone]  and follow the prompts.  Office hours are 8:00 a.m. to 4:30 p.m. Monday - Friday. Please note that voicemails left after 4:00 p.m. may not be returned until the following business day.  We are closed weekends and major holidays. You have access to a nurse at all times for urgent questions. Please call the main number to the clinic Dept: [phone] and follow the prompts.   For any non-urgent questions, you may also contact your provider using MyChart. We now offer e-Visits for anyone 24 and older to request care online for non-urgent symptoms. For details visit [email or web address].   Also download the MyChart app! Go to the app store, search MyChart, open the app, select [facility], and log in with your MyChart username and password.

## 2024-03-18 ENCOUNTER — Ambulatory Visit (INDEPENDENT_AMBULATORY_CARE_PROVIDER_SITE_OTHER): Admitting: Otolaryngology

## 2024-03-22 ENCOUNTER — Ambulatory Visit: Admitting: Family Medicine

## 2024-03-22 DIAGNOSIS — Z0000 Encounter for general adult medical examination without abnormal findings: Secondary | ICD-10-CM

## 2024-03-22 NOTE — Progress Notes (Signed)
 " ----------------------------------------------------------------------------------------------------------------------------------------------------------------------------------------------------------------------  Because this visit was a virtual/telehealth visit, some criteria may be missing or patient reported. Any vitals not documented were not able to be obtained and vitals that have been documented are patient reported.    MEDICARE ANNUAL PREVENTIVE VISIT WITH PROVIDER: (Welcome to [facility], initial annual wellness or annual wellness exam)  Virtual Visit via Video Note  I connected with Wendy [name] on 03/22/24 by a video enabled telemedicine application and verified that I am speaking with the correct person using two identifiers.  Location patient: home Location provider:work or home office Persons participating in the virtual visit: patient, provider  Concerns and/or follow up today: detailed intake and risks/health assessment completed in flowsheets and below - please see for details.   How often do you have a drink containing alcohol?n How many drinks containing alcohol do you have on a typical day when you are drinking?na How often do you have six or more drinks on one occasion?na Have you ever smoked?n Quit date if applicable? na  How many packs a day do/did you smoke? na Do you use smokeless tobacco?na Do you use an illicit drugs?na Do you feel safe at home?y Last dentist visit?getting partial - waiting on this, goes to the dentist Last eye Exam and location?had visit a few months ago   See HM section in Epic for other details of completed HM.    ROS: negative for report of fevers, unintentional weight loss, vision changes, vision loss, hearing loss or change, chest pain, sob, hemoptysis, melena, hematochezia, hematuria or bleeding or bruising  Patient-completed extensive health risk assessment - reviewed and discussed with the patient: See Health Risk  Assessment completed with patient prior to the visit either above or in recent phone note. This was reviewed in detailed with the patient today and appropriate recommendations, orders and referrals were placed as needed per Summary below and patient instructions.   Review of Medical History: -PMH, PSH, Family History and current specialty and care providers reviewed and updated and listed below   Patient Care Team: [name] LABOR, MD as PCP - General Liane, Sharyne MATSU, [facility] (Inactive) as Pharmacist (Pharmacist)   Past Medical History:  Diagnosis Date   Allergy    year around   Anemia    long time ago    Anxiety    Arthritis    RA    Cataract    removed  both dr effie-    Complication of anesthesia    DEPENDENT EDEMA 07/21/2007   DIABETES MELLITUS, TYPE II 11/05/2006   FOOT PAIN 05/23/2009   GERD 11/20/2006   Hepatitis    HEPATITIS C 02/08/2007   sees GI @ [facility]   History of radiation therapy    Thoracic Spine-12/14/23-12/25/23- Dr. 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 Jon Jacob     Past Surgical History:  Procedure Laterality Date   BRONCHIAL BRUSHINGS  04/07/2023   Procedure: BRONCHIAL BRUSHINGS;  Surgeon: Shelah Lamar RAMAN, MD;  Location: [facility] ENDOSCOPY;  Service: Pulmonary;;   BRONCHIAL NEEDLE ASPIRATION BIOPSY  04/07/2023   Procedure: BRONCHIAL NEEDLE ASPIRATION BIOPSIES;  Surgeon: Shelah Lamar RAMAN, MD;  Location: [facility] ENDOSCOPY;  Service: Pulmonary;;   CATARACT EXTRACTION  COLONOSCOPY  11/23/2019   per Dr. Teressa, adenmatous polyp, repeat in 7 yrs   ESOPHAGOGASTRODUODENOSCOPY  06/20/2008   at [facility], clear    FINE NEEDLE ASPIRATION  04/07/2023   Procedure: FINE NEEDLE ASPIRATION (FNA) LINEAR;  Surgeon:  Shelah Lamar RAMAN, MD;  Location: [facility] ENDOSCOPY;  Service: Pulmonary;;   IR IMAGING GUIDED PORT INSERTION  04/30/2023   LUMBAR LAMINECTOMY     1995   ROTATOR CUFF REPAIR Right    SPINAL FUSION N/A 07/29/2022   Procedure: T10-PELVIS INSTRUMENTATION/ T10-L4 POSTERIOR SPINAL FUSION;  Surgeon: Georgina Ozell LABOR, MD;  Location: [facility] OR;  Service: Orthopedics;  Laterality: N/A;   UPPER GASTROINTESTINAL ENDOSCOPY  2010   [facility]    VIDEO BRONCHOSCOPY WITH ENDOBRONCHIAL ULTRASOUND N/A 04/07/2023   Procedure: VIDEO BRONCHOSCOPY WITH ENDOBRONCHIAL ULTRASOUND;  Surgeon: Shelah Lamar RAMAN, MD;  Location: [facility] ENDOSCOPY;  Service: Pulmonary;  Laterality: N/A;    Social History   Socioeconomic History   Marital status: Widowed    Spouse name: Not on file   Number of children: 2   Years of education: Not on file   Highest education level: Not on file  Occupational History   Not on file  Tobacco Use   Smoking status: Former    Current packs/day: 0.00    Average packs/day: (1.4 ttl pk-yrs)    Types: Cigarettes, Cigars    Start date: 04/19/1977    Quit date: 04/19/2022    Years since quitting: 1.9    Passive exposure: Current   Smokeless tobacco: Never   Tobacco comments:    1 black and mild every 2-3 days- no cigs - doesnt smoke the whole thing (quit smoking cigarettes around 2019)  Vaping Use   Vaping status: Never Used  Substance and Sexual Activity   Alcohol use: Not Currently   Drug use: Not Currently   Sexual activity: Not on file  Other Topics Concern   Not on file  Social History Narrative   ** Merged History Encounter **       Social Drivers of Health   Tobacco Use: Medium Risk (03/15/2024)   Patient History    Smoking Tobacco Use: Former    Smokeless Tobacco Use: Never    Passive Exposure: Current  Physicist, Medical Strain: Low Risk (01/16/2022)   Overall Financial Resource Strain (CARDIA)    Difficulty of Paying Living Expenses: Not very hard  Food Insecurity: Food Insecurity  Present (12/02/2023)   Epic    Worried About Programme Researcher, Broadcasting/film/video in the Last Year: Sometimes true    Ran Out of Food in the Last Year: Sometimes true  Transportation Needs: No Transportation Needs (12/02/2023)   Epic    Lack of Transportation (Medical): No    Lack of Transportation (Non-Medical): No  Physical Activity: Insufficiently Active (03/22/2024)   Exercise Vital Sign    Days of Exercise per Week: 7 days    Minutes of Exercise per Session: 10 min  Stress: No Stress Concern Present (03/22/2024)   [employer] of Occupational Health - Occupational Stress Questionnaire    Feeling of Stress: Only a little  Social Connections: Moderately Integrated (03/22/2024)   Social Connection and Isolation Panel    Frequency of Communication with Friends and Family: More than three times a week    Frequency of Social Gatherings with Friends and Family: More than three times a week    Attends Religious Services: More than 4 times per year    Active Member of Clubs or  Organizations: Yes    Attends Banker Meetings: 1 to 4 times per year    Marital Status: Widowed  Intimate Partner Violence: Not At Risk (12/02/2023)   Epic    Fear of Current or Ex-Partner: No    Emotionally Abused: No    Physically Abused: No    Sexually Abused: No  Depression (PHQ2-9): Low Risk (03/22/2024)   Depression (PHQ2-9)    PHQ-2 Score: 1  Alcohol Screen: Low Risk (11/29/2021)   Alcohol Screen    Last Alcohol Screening Score (AUDIT): 0  Housing: High Risk (12/02/2023)   Epic    Unable to Pay for Housing in the Last Year: Yes    Number of Times Moved in the Last Year: 0    Homeless in the Last Year: No  Utilities: Not At Risk (12/02/2023)   Epic    Threatened with loss of utilities: No  Health Literacy: Not on file    Family History  Problem Relation Age of Onset   Lung cancer Mother    Heart Problems Father    Pancreatic cancer Sister    Lung cancer Sister    Congestive Heart Failure Sister     Cancer Brother    Diabetes Brother    Other Brother        dehydration   Arthritis Other    Diabetes Other    Hypertension Other    Cancer Other        lung   Healthy Son    Breast cancer Daughter    Colon cancer Neg Hx    Colon polyps Neg Hx    Esophageal cancer Neg Hx    Rectal cancer Neg Hx    Stomach cancer Neg Hx     Medications Ordered Prior to Encounter[1]  Allergies[2]     Physical Exam Vitals requested from patient and listed below if patient had equipment and was able to obtain at home for this virtual visit: There were no vitals filed for this visit. Estimated body mass index is 32.56 kg/m as calculated from the following:   Height as of 03/16/24: 5' 3 (1.6 m).   Weight as of 03/16/24: 183 lb 12.8 oz (83.4 kg).  EKG (optional): deferred due to virtual visit  GENERAL: alert, oriented, no acute distress detected, full vision exam deferred due to pandemic and/or virtual encounter  HEENT: atraumatic, conjunttiva clear, no obvious abnormalities on inspection of external nose and ears  NECK: normal movements of the head and neck  LUNGS: on inspection no signs of respiratory distress, breathing rate appears normal, no obvious gross SOB, gasping or wheezing  CV: no obvious cyanosis  MS: moves all visible extremities without noticeable abnormality  PSYCH/NEURO: pleasant and cooperative, no obvious depression or anxiety, speech and thought processing grossly intact, Cognitive function grossly intact  Flowsheet Row Video Visit from 09/08/2023 in [facility]  PHQ-9 Total Score 5        03/22/2024    6:07 PM 02/17/2024    9:27 AM 12/02/2023    2:58 PM 11/10/2023   12:15 PM 09/08/2023    2:05 PM  Depression screen PHQ 2/9  Decreased Interest 0 1 3 0 3  Down, Depressed, Hopeless 1 1 3  0 0  PHQ - 2 Score 1 2 6  0 3  Altered sleeping     1  Tired, decreased energy     0  Change in appetite     0  Feeling bad or  failure about yourself       1  Trouble concentrating     0  Moving slowly or fidgety/restless     0  Suicidal thoughts     0  PHQ-9 Score     5      Data saved with a previous flowsheet row definition       12/02/2022    2:53 PM 12/17/2022    4:10 PM 04/15/2023   10:42 AM 09/08/2023    2:05 PM 03/22/2024    5:55 PM  Fall Risk  Falls in the past year? 0 1 0 1 0  Was there an injury with Fall? 0  0   1  0  Fall Risk Category Calculator 0 1  2 0  Patient at Risk for Falls Due to  No Fall Risks   No Fall Risks  Fall risk Follow up Falls evaluation completed Falls evaluation completed  Falls evaluation completed Falls evaluation completed     Data saved with a previous flowsheet row definition     SUMMARY AND PLAN:  Encounter for Medicare annual wellness exam   Discussed applicable health maintenance/preventive health measures and advised and referred or ordered per patient preferences: -she thinks she had her covid and flu vaccines in September Health Maintenance  Topic Date Due   Influenza Vaccine  10/02/2023   COVID-19 Vaccine (4 - 2025-26 season) 11/02/2023   Diabetic kidney evaluation - Urine ACR  07/28/2024   HEMOGLOBIN A1C  09/11/2024   OPHTHALMOLOGY EXAM  12/23/2024   FOOT EXAM  03/14/2025   Diabetic kidney evaluation - eGFR measurement  03/16/2025   Medicare Annual Wellness (AWV)  03/22/2025   Colonoscopy  11/23/2026   DTaP/Tdap/Td (2 - Td or Tdap) 03/07/2031   Pneumococcal Vaccine: 50+ Years  Completed   Bone Density Scan  Completed   Hepatitis C Screening  Completed   Zoster Vaccines- Shingrix  Completed   Meningococcal B Vaccine  Aged Out   Mammogram  Discontinued      Education and counseling on the following was provided based on the above review of health and a plan/checklist for the patient, along with additional information discussed, was provided for the patient in the patient instructions :  -Advised on importance of completing advanced directives, discussed options for  completing and provided information in patient instructions as well - she reports daughter and son know her wishes and they have talked, declines -Provided counseling and plan for difficulty hearing  -Advised and counseled on a healthy lifestyle - including the importance of a healthy diet, regular physical activity, social connections and stress management. She declined counseling - has strong faith and she feels she is coping ok. Advised to let us  or cancer center folks know if she changes mind about counseling.  -Reviewed patient's current diet. Advised and counseled on a whole foods based healthy diet. A summary of a healthy diet was provided in the Patient Instructions.  -reviewed patient's current physical activity level and discussed exercise guidelines for adults. Discussed options for gentle chair and home exercise.  -Advise yearly dental visits at minimum and regular eye exams  Follow up: see patient instructions     Patient Instructions  I really enjoyed getting to talk with you today! I am available on Tuesdays and Thursdays for virtual visits if you have any questions or concerns, or if I can be of any further assistance.   CHECKLIST FROM ANNUAL WELLNESS VISIT:  -Follow up (please call to schedule  if not scheduled after visit):   -yearly for annual wellness visit with primary care office  Here is a list of your preventive care/health maintenance measures and the plan for each if any are due:  PLAN For any measures below that may be due:   Health Maintenance  Topic Date Due   Influenza Vaccine  10/02/2023   COVID-19 Vaccine (4 - 2025-26 season) 11/02/2023   Medicare Annual Wellness (AWV)  12/02/2023   Diabetic kidney evaluation - Urine ACR  07/28/2024   HEMOGLOBIN A1C  09/11/2024   OPHTHALMOLOGY EXAM  12/23/2024   FOOT EXAM  03/14/2025   Diabetic kidney evaluation - eGFR measurement  03/16/2025   Colonoscopy  11/23/2026   DTaP/Tdap/Td (2 - Td or Tdap) 03/07/2031    Pneumococcal Vaccine: 50+ Years  Completed   Bone Density Scan  Completed   Hepatitis C Screening  Completed   Zoster Vaccines- Shingrix  Completed   Meningococcal B Vaccine  Aged Out   Mammogram  Discontinued    -See a dentist at least yearly  -Get your eyes checked and then per your eye specialist's recommendations  -Other issues addressed today:   -I have included below further information regarding a healthy whole foods based diet, physical activity guidelines for adults, stress management and opportunities for social connections. I hope you find this information useful.   -----------------------------------------------------------------------------------------------------------------------------------------------------------------------------------------------------------------------------------------------------------    NUTRITION: -eat real food: lots of colorful vegetables (half the plate) and fruits -5-7 servings of vegetables and fruits per day (fresh or steamed is best), exp. 2 servings of vegetables with lunch and dinner and 2 servings of fruit per day. Berries and greens such as kale and collards are great choices.  -consume on a regular basis:  fresh fruits, fresh veggies, fish, nuts, seeds, healthy oils (such as olive oil, avocado oil), whole grains (make sure for bread/pasta/crackers/etc., that the first ingredient on label contains the word whole), legumes. -can eat small amounts of dairy and lean meat (no larger than the palm of your hand), but avoid processed meats such as ham, bacon, lunch meat, etc. -drink water -try to avoid fast food and pre-packaged foods, processed meat, ultra processed foods/beverages (donuts, candy, etc.) -most experts advise limiting sodium to < 2300mg  per day, should limit further is any chronic conditions such as high blood pressure, heart disease, diabetes, etc. The American Heart Association advised that < 1500mg  is is ideal -try to  avoid foods/beverages that contain any ingredients with names you do not recognize  -try to avoid foods/beverages  with added sugar or sweeteners/sweets  -try to avoid sweet drinks (including diet drinks): soda, juice, Gatorade, sweet tea, power drinks, diet drinks -try to avoid white rice, white bread, pasta (unless whole grain)  EXERCISE GUIDELINES FOR ADULTS: -if you wish to increase your physical activity, do so gradually and with the approval of your doctor -STOP and seek medical care immediately if you have any chest pain, chest discomfort or trouble breathing when starting or increasing exercise  -move and stretch your body, legs, feet and arms when sitting for long periods -Physical activity guidelines for optimal health in adults: -get at least 150 minutes per week of  exercise - this can be chair exercises or chair yoga or seated exercises -do some muscle building/resistance training/strength training at least 2 days per week  -balance exercises 3+ days per week:   Stand somewhere where you have something sturdy to hold onto if you lose balance    1) lift up on  toes, then back down, start with 5x per day and work up to 20x   2) stand and lift one leg straight out to the side so that foot is a few inches of the floor, start with 5x each side and work up to 20x each side   3) stand on one foot, start with 5 seconds each side and work up to 20 seconds on each side    STRESS MANAGEMENT: -can try meditating, or just sitting quietly with deep breathing while intentionally relaxing all parts of your body for 5 minutes daily -if you need further help with stress, anxiety or depression please follow up with your primary doctor or contact the wonderful folks at [facility]: [phone]              Chiquita JONELLE Cramp, DO      [1]  Current Outpatient Medications on File Prior to Visit  Medication Sig Dispense Refill   Accu-Chek Softclix Lancets lancets Test once  per day and diagnosis code is E 11.9 and dispense for (ACCU-CHEK Soft Touch) 100 each 12   ALPRAZolam  (XANAX ) 1 MG tablet TAKE 1 TABLET (1 MG TOTAL) BY MOUTH AT BEDTIME AS NEEDED FOR ANXIETY OR SLEEP. 30 tablet 5   amLODipine  (NORVASC ) 10 MG tablet Take 1 tablet (10 mg total) by mouth daily. 90 tablet 3   atorvastatin  (LIPITOR) 10 MG tablet Take 1 tablet (10 mg total) by mouth daily. 90 tablet 3   Blood Glucose Monitoring Suppl (ACCU-CHEK GUIDE) w/Device KIT Use to test sugars daily. 1 kit 0   Blood Glucose Monitoring Suppl DEVI 1 each by Does not apply route in the morning, at noon, and at bedtime. May substitute to any manufacturer covered by patient's insurance. 1 each 0   Blood Pressure Monitor KIT Use to check blood pressure. 1 kit 0   Cholecalciferol (VITAMIN D3) 20 MCG (800 UNIT) TABS Take 800 Units by mouth daily.     dexamethasone  (DECADRON ) 4 MG tablet Take 1 tab by mouth 2 times daily starting day before pemetrexed . Then take 2 tabs daily x 3 days starting day after chemo. Take with food. 30 tablet 1   DULoxetine  (CYMBALTA ) 30 MG capsule Take 1 capsule (30 mg total) by mouth at bedtime.     fluticasone  (FLONASE ) 50 MCG/ACT nasal spray SPRAY 2 SPRAYS INTO EACH NOSTRIL EVERY DAY 48 mL 3   folic acid  (FOLVITE ) 1 MG tablet Take 1 tablet (1 mg total) by mouth daily. Start 7 days before pemetrexed  chemotherapy. Continue until 21 days after pemetrexed  completed. 100 tablet 3   furosemide  (LASIX ) 20 MG tablet Take 1 tablet (20 mg total) by mouth daily as needed (fluid retention). 90 tablet 3   Ginkgo Biloba (GNP GINGKO BILOBA EXTRACT PO) Take by mouth.     glipiZIDE  (GLUCOTROL ) 5 MG tablet Take 1 tablet (5 mg total) by mouth 2 (two) times daily before a meal. 180 tablet 3   glucose blood (ACCU-CHEK GUIDE TEST) test strip Use as instructed 100 strip 1   hydroxychloroquine  (PLAQUENIL ) 200 MG tablet Take 1 tablet (200 mg total) by mouth daily. 90 tablet 1   insulin  aspart (NOVOLOG  FLEXPEN) 100  UNIT/ML FlexPen Inject 5 Units into the skin 3 (three) times daily with meals. Plus sliding scale , maximum 20 units/day. 15 mL 4   insulin  degludec (TRESIBA  FLEXTOUCH) 100 UNIT/ML FlexTouch Pen Inject 20 Units into the skin daily. 15 mL 4   Insulin  Pen Needle 32G X 4  MM MISC Use 4x a day 300 each 3   Lancets (ACCU-CHEK SOFT TOUCH) lancets Test once per day and diagnosis code is E 11.9 and dispense for Aviva plus 100 each 3   LANTUS  SOLOSTAR 100 UNIT/ML Solostar Pen Inject 25 Units into the skin at bedtime.     levocetirizine (XYZAL ) 5 MG tablet TAKE 1 TABLET BY MOUTH EVERY DAY IN THE EVENING 90 tablet 3   levocetirizine (XYZAL ) 5 MG tablet Take 1 tablet (5 mg total) by mouth every evening. 90 tablet 3   lidocaine -prilocaine  (EMLA ) cream Apply to affected area once 30 g 3   metoCLOPramide  (REGLAN ) 10 MG tablet Take 1 tablet (10 mg total) by mouth 3 (three) times daily with meals for 5 days. 15 tablet 0   mupirocin  ointment (BACTROBAN ) 2 % Apply 1 Application topically 2 (two) times daily. 22 g 5   omeprazole  (PRILOSEC) 40 MG capsule TAKE 1 CAPSULE BY MOUTH EVERY DAY 90 capsule 3   ondansetron  (ZOFRAN ) 4 MG tablet Take 1 tablet (4 mg total) by mouth every 8 (eight) hours as needed for nausea or vomiting. 90 tablet 3   ondansetron  (ZOFRAN ) 8 MG tablet Take 1 tablet (8 mg total) by mouth every 8 (eight) hours as needed for nausea or vomiting. Start on the third day after chemotherapy. 30 tablet 1   oxyCODONE -acetaminophen  (PERCOCET) 10-325 MG tablet Take 1 tablet by mouth every 6 (six) hours as needed for pain. 120 tablet 0   oxyCODONE -acetaminophen  (PERCOCET) 10-325 MG tablet Take 1 tablet by mouth every 6 (six) hours as needed for pain. 120 tablet 0   oxyCODONE -acetaminophen  (PERCOCET) 10-325 MG tablet Take 1 tablet by mouth every 6 (six) hours as needed for pain. 120 tablet 0   polyethylene glycol (MIRALAX  / GLYCOLAX ) 17 g packet Take 17 g by mouth daily as needed for mild constipation.      potassium chloride  (KLOR-CON ) 10 MEQ tablet TAKE 1 TABLET BY MOUTH EVERY DAY 90 tablet 3   pregabalin  (LYRICA ) 100 MG capsule TAKE 1 CAPSULE BY MOUTH AT BEDTIME. 90 capsule 1   prochlorperazine  (COMPAZINE ) 10 MG tablet Take 1 tablet (10 mg total) by mouth every 6 (six) hours as needed for nausea or vomiting. 30 tablet 1   RESTASIS  0.05 % ophthalmic emulsion Place 1 drop into both eyes 2 (two) times daily.     sitaGLIPtin  (JANUVIA ) 100 MG tablet Take 1 tablet (100 mg total) by mouth at bedtime. 90 tablet 3   sucralfate  (CARAFATE ) 1 g tablet Take 1 tablet (1 g total) by mouth 4 (four) times daily. Dissolve each tablet in 15 cc water before use. 120 tablet 2   No current facility-administered medications on file prior to visit.  [2]  Allergies Allergen Reactions   Aspirin Nausea And Vomiting   Tramadol Nausea And Vomiting   "

## 2024-03-22 NOTE — Patient Instructions (Signed)
 I really enjoyed getting to talk with you today! I am available on Tuesdays and Thursdays for virtual visits if you have any questions or concerns, or if I can be of any further assistance.   CHECKLIST FROM ANNUAL WELLNESS VISIT:  -Follow up (please call to schedule if not scheduled after visit):   -yearly for annual wellness visit with primary care office  Here is a list of your preventive care/health maintenance measures and the plan for each if any are due:  PLAN For any measures below that may be due:   Health Maintenance  Topic Date Due   Influenza Vaccine  10/02/2023   COVID-19 Vaccine (4 - 2025-26 season) 11/02/2023   Medicare Annual Wellness (AWV)  12/02/2023   Diabetic kidney evaluation - Urine ACR  07/28/2024   HEMOGLOBIN A1C  09/11/2024   OPHTHALMOLOGY EXAM  12/23/2024   FOOT EXAM  03/14/2025   Diabetic kidney evaluation - eGFR measurement  03/16/2025   Colonoscopy  11/23/2026   DTaP/Tdap/Td (2 - Td or Tdap) 03/07/2031   Pneumococcal Vaccine: 50+ Years  Completed   Bone Density Scan  Completed   Hepatitis C Screening  Completed   Zoster Vaccines- Shingrix  Completed   Meningococcal B Vaccine  Aged Out   Mammogram  Discontinued    -See a dentist at least yearly  -Get your eyes checked and then per your eye specialist's recommendations  -Other issues addressed today:   -I have included below further information regarding a healthy whole foods based diet, physical activity guidelines for adults, stress management and opportunities for social connections. I hope you find this information useful.   -----------------------------------------------------------------------------------------------------------------------------------------------------------------------------------------------------------------------------------------------------------    NUTRITION: -eat real food: lots of colorful vegetables (half the plate) and fruits -5-7 servings of vegetables and  fruits per day (fresh or steamed is best), exp. 2 servings of vegetables with lunch and dinner and 2 servings of fruit per day. Berries and greens such as kale and collards are great choices.  -consume on a regular basis:  fresh fruits, fresh veggies, fish, nuts, seeds, healthy oils (such as olive oil, avocado oil), whole grains (make sure for bread/pasta/crackers/etc., that the first ingredient on label contains the word whole), legumes. -can eat small amounts of dairy and lean meat (no larger than the palm of your hand), but avoid processed meats such as ham, bacon, lunch meat, etc. -drink water -try to avoid fast food and pre-packaged foods, processed meat, ultra processed foods/beverages (donuts, candy, etc.) -most experts advise limiting sodium to < 2300mg  per day, should limit further is any chronic conditions such as high blood pressure, heart disease, diabetes, etc. The American Heart Association advised that < 1500mg  is is ideal -try to avoid foods/beverages that contain any ingredients with names you do not recognize  -try to avoid foods/beverages  with added sugar or sweeteners/sweets  -try to avoid sweet drinks (including diet drinks): soda, juice, Gatorade, sweet tea, power drinks, diet drinks -try to avoid white rice, white bread, pasta (unless whole grain)  EXERCISE GUIDELINES FOR ADULTS: -if you wish to increase your physical activity, do so gradually and with the approval of your doctor -STOP and seek medical care immediately if you have any chest pain, chest discomfort or trouble breathing when starting or increasing exercise  -move and stretch your body, legs, feet and arms when sitting for long periods -Physical activity guidelines for optimal health in adults: -get at least 150 minutes per week of  exercise - this can be chair exercises  or chair yoga or seated exercises -do some muscle building/resistance training/strength training at least 2 days per week  -balance exercises  3+ days per week:   Stand somewhere where you have something sturdy to hold onto if you lose balance    1) lift up on toes, then back down, start with 5x per day and work up to 20x   2) stand and lift one leg straight out to the side so that foot is a few inches of the floor, start with 5x each side and work up to 20x each side   3) stand on one foot, start with 5 seconds each side and work up to 20 seconds on each side    STRESS MANAGEMENT: -can try meditating, or just sitting quietly with deep breathing while intentionally relaxing all parts of your body for 5 minutes daily -if you need further help with stress, anxiety or depression please follow up with your primary doctor or contact the wonderful folks at [facility]: [phone]

## 2024-03-23 ENCOUNTER — Inpatient Hospital Stay

## 2024-03-23 DIAGNOSIS — C3431 Malignant neoplasm of lower lobe, right bronchus or lung: Secondary | ICD-10-CM

## 2024-03-23 DIAGNOSIS — Z5112 Encounter for antineoplastic immunotherapy: Secondary | ICD-10-CM | POA: Diagnosis not present

## 2024-03-23 LAB — CBC WITH DIFFERENTIAL (CANCER CENTER ONLY)
Abs Immature Granulocytes: 0.02 K/uL (ref 0.00–0.07)
Basophils Absolute: 0 K/uL (ref 0.0–0.1)
Basophils Relative: 0 %
Eosinophils Absolute: 0 K/uL (ref 0.0–0.5)
Eosinophils Relative: 0 %
HCT: 32.1 % — ABNORMAL LOW (ref 36.0–46.0)
Hemoglobin: 10.1 g/dL — ABNORMAL LOW (ref 12.0–15.0)
Immature Granulocytes: 1 %
Lymphocytes Relative: 22 %
Lymphs Abs: 0.6 K/uL — ABNORMAL LOW (ref 0.7–4.0)
MCH: 25.8 pg — ABNORMAL LOW (ref 26.0–34.0)
MCHC: 31.5 g/dL (ref 30.0–36.0)
MCV: 82.1 fL (ref 80.0–100.0)
Monocytes Absolute: 0.5 K/uL (ref 0.1–1.0)
Monocytes Relative: 21 %
Neutro Abs: 1.5 K/uL — ABNORMAL LOW (ref 1.7–7.7)
Neutrophils Relative %: 56 %
Platelet Count: 100 K/uL — ABNORMAL LOW (ref 150–400)
RBC: 3.91 MIL/uL (ref 3.87–5.11)
RDW: 20.1 % — ABNORMAL HIGH (ref 11.5–15.5)
WBC Count: 2.6 K/uL — ABNORMAL LOW (ref 4.0–10.5)
nRBC: 0 % (ref 0.0–0.2)

## 2024-03-23 LAB — CMP (CANCER CENTER ONLY)
ALT: 17 U/L (ref 0–44)
AST: 25 U/L (ref 15–41)
Albumin: 3.7 g/dL (ref 3.5–5.0)
Alkaline Phosphatase: 73 U/L (ref 38–126)
Anion gap: 11 (ref 5–15)
BUN: 18 mg/dL (ref 8–23)
CO2: 24 mmol/L (ref 22–32)
Calcium: 8.6 mg/dL — ABNORMAL LOW (ref 8.9–10.3)
Chloride: 107 mmol/L (ref 98–111)
Creatinine: 0.86 mg/dL (ref 0.44–1.00)
GFR, Estimated: >60 mL/min
Glucose, Bld: 94 mg/dL (ref 70–99)
Potassium: 4.3 mmol/L (ref 3.5–5.1)
Sodium: 142 mmol/L (ref 135–145)
Total Bilirubin: 0.4 mg/dL (ref 0.0–1.2)
Total Protein: 7.1 g/dL (ref 6.5–8.1)

## 2024-03-30 ENCOUNTER — Inpatient Hospital Stay

## 2024-03-30 ENCOUNTER — Inpatient Hospital Stay: Admitting: Internal Medicine

## 2024-03-30 DIAGNOSIS — Z5112 Encounter for antineoplastic immunotherapy: Secondary | ICD-10-CM | POA: Diagnosis not present

## 2024-03-30 DIAGNOSIS — C3431 Malignant neoplasm of lower lobe, right bronchus or lung: Secondary | ICD-10-CM

## 2024-03-30 LAB — CBC WITH DIFFERENTIAL (CANCER CENTER ONLY)
Abs Immature Granulocytes: 0.02 10*3/uL (ref 0.00–0.07)
Basophils Absolute: 0 10*3/uL (ref 0.0–0.1)
Basophils Relative: 0 %
Eosinophils Absolute: 0 10*3/uL (ref 0.0–0.5)
Eosinophils Relative: 0 %
HCT: 31 % — ABNORMAL LOW (ref 36.0–46.0)
Hemoglobin: 9.7 g/dL — ABNORMAL LOW (ref 12.0–15.0)
Immature Granulocytes: 1 %
Lymphocytes Relative: 23 %
Lymphs Abs: 0.6 10*3/uL — ABNORMAL LOW (ref 0.7–4.0)
MCH: 26.3 pg (ref 26.0–34.0)
MCHC: 31.3 g/dL (ref 30.0–36.0)
MCV: 84 fL (ref 80.0–100.0)
Monocytes Absolute: 0.8 10*3/uL (ref 0.1–1.0)
Monocytes Relative: 29 %
Neutro Abs: 1.2 10*3/uL — ABNORMAL LOW (ref 1.7–7.7)
Neutrophils Relative %: 47 %
Platelet Count: 98 10*3/uL — ABNORMAL LOW (ref 150–400)
RBC: 3.69 MIL/uL — ABNORMAL LOW (ref 3.87–5.11)
RDW: 21.5 % — ABNORMAL HIGH (ref 11.5–15.5)
WBC Count: 2.7 10*3/uL — ABNORMAL LOW (ref 4.0–10.5)
nRBC: 0 % (ref 0.0–0.2)

## 2024-03-30 LAB — CMP (CANCER CENTER ONLY)
ALT: 17 U/L (ref 0–44)
AST: 24 U/L (ref 15–41)
Albumin: 3.9 g/dL (ref 3.5–5.0)
Alkaline Phosphatase: 79 U/L (ref 38–126)
Anion gap: 10 (ref 5–15)
BUN: 18 mg/dL (ref 8–23)
CO2: 26 mmol/L (ref 22–32)
Calcium: 9.1 mg/dL (ref 8.9–10.3)
Chloride: 102 mmol/L (ref 98–111)
Creatinine: 1 mg/dL (ref 0.44–1.00)
GFR, Estimated: 58 mL/min — ABNORMAL LOW
Glucose, Bld: 167 mg/dL — ABNORMAL HIGH (ref 70–99)
Potassium: 4.8 mmol/L (ref 3.5–5.1)
Sodium: 138 mmol/L (ref 135–145)
Total Bilirubin: 0.4 mg/dL (ref 0.0–1.2)
Total Protein: 7.2 g/dL (ref 6.5–8.1)

## 2024-04-05 ENCOUNTER — Other Ambulatory Visit: Payer: Self-pay | Admitting: Family Medicine

## 2024-04-05 MED FILL — Fosaprepitant Dimeglumine For IV Infusion 150 MG (Base Eq): INTRAVENOUS | Qty: 5 | Status: AC

## 2024-04-06 ENCOUNTER — Inpatient Hospital Stay: Attending: Internal Medicine

## 2024-04-06 ENCOUNTER — Inpatient Hospital Stay: Attending: Internal Medicine | Admitting: Internal Medicine

## 2024-04-06 ENCOUNTER — Inpatient Hospital Stay

## 2024-04-06 VITALS — BP 138/86 | HR 87 | Resp 16

## 2024-04-06 VITALS — BP 153/90 | HR 97 | Temp 97.9°F | Resp 17 | Ht 63.0 in | Wt 188.7 lb

## 2024-04-06 DIAGNOSIS — C3431 Malignant neoplasm of lower lobe, right bronchus or lung: Secondary | ICD-10-CM

## 2024-04-06 DIAGNOSIS — C3490 Malignant neoplasm of unspecified part of unspecified bronchus or lung: Secondary | ICD-10-CM | POA: Diagnosis not present

## 2024-04-06 LAB — CMP (CANCER CENTER ONLY)
ALT: 18 U/L (ref 0–44)
AST: 24 U/L (ref 15–41)
Albumin: 4 g/dL (ref 3.5–5.0)
Alkaline Phosphatase: 82 U/L (ref 38–126)
Anion gap: 9 (ref 5–15)
BUN: 12 mg/dL (ref 8–23)
CO2: 24 mmol/L (ref 22–32)
Calcium: 9 mg/dL (ref 8.9–10.3)
Chloride: 108 mmol/L (ref 98–111)
Creatinine: 0.86 mg/dL (ref 0.44–1.00)
GFR, Estimated: >60 mL/min
Glucose, Bld: 112 mg/dL — ABNORMAL HIGH (ref 70–99)
Potassium: 4.1 mmol/L (ref 3.5–5.1)
Sodium: 141 mmol/L (ref 135–145)
Total Bilirubin: 0.6 mg/dL (ref 0.0–1.2)
Total Protein: 7.7 g/dL (ref 6.5–8.1)

## 2024-04-06 LAB — CBC WITH DIFFERENTIAL (CANCER CENTER ONLY)
Abs Immature Granulocytes: 0.04 10*3/uL (ref 0.00–0.07)
Basophils Absolute: 0 10*3/uL (ref 0.0–0.1)
Basophils Relative: 0 %
Eosinophils Absolute: 0 10*3/uL (ref 0.0–0.5)
Eosinophils Relative: 0 %
HCT: 35.9 % — ABNORMAL LOW (ref 36.0–46.0)
Hemoglobin: 11.3 g/dL — ABNORMAL LOW (ref 12.0–15.0)
Immature Granulocytes: 1 %
Lymphocytes Relative: 19 %
Lymphs Abs: 0.5 10*3/uL — ABNORMAL LOW (ref 0.7–4.0)
MCH: 26.8 pg (ref 26.0–34.0)
MCHC: 31.5 g/dL (ref 30.0–36.0)
MCV: 85.1 fL (ref 80.0–100.0)
Monocytes Absolute: 0.7 10*3/uL (ref 0.1–1.0)
Monocytes Relative: 24 %
Neutro Abs: 1.6 10*3/uL — ABNORMAL LOW (ref 1.7–7.7)
Neutrophils Relative %: 56 %
Platelet Count: 105 10*3/uL — ABNORMAL LOW (ref 150–400)
RBC: 4.22 MIL/uL (ref 3.87–5.11)
RDW: 20.8 % — ABNORMAL HIGH (ref 11.5–15.5)
WBC Count: 2.9 10*3/uL — ABNORMAL LOW (ref 4.0–10.5)
nRBC: 0 % (ref 0.0–0.2)

## 2024-04-06 MED ORDER — DEXAMETHASONE SOD PHOSPHATE PF 10 MG/ML IJ SOLN
10.0000 mg | Freq: Once | INTRAMUSCULAR | Status: AC
  Administered 2024-04-06: 10 mg via INTRAVENOUS
  Filled 2024-04-06: qty 1

## 2024-04-06 MED ORDER — CYANOCOBALAMIN 1000 MCG/ML IJ SOLN
1000.0000 ug | Freq: Once | INTRAMUSCULAR | Status: AC
  Administered 2024-04-06: 1000 ug via INTRAMUSCULAR
  Filled 2024-04-06: qty 1

## 2024-04-06 MED ORDER — CETIRIZINE HCL 10 MG/ML IV SOLN
10.0000 mg | Freq: Once | INTRAVENOUS | Status: AC
  Administered 2024-04-06: 10 mg via INTRAVENOUS
  Filled 2024-04-06: qty 1

## 2024-04-06 MED ORDER — SODIUM CHLORIDE 0.9 % IV SOLN
346.8000 mg | Freq: Once | INTRAVENOUS | Status: AC
  Administered 2024-04-06: 350 mg via INTRAVENOUS
  Filled 2024-04-06: qty 35

## 2024-04-06 MED ORDER — FAMOTIDINE IN NACL 20-0.9 MG/50ML-% IV SOLN
20.0000 mg | Freq: Once | INTRAVENOUS | Status: AC
  Administered 2024-04-06: 20 mg via INTRAVENOUS
  Filled 2024-04-06: qty 50

## 2024-04-06 MED ORDER — PALONOSETRON HCL INJECTION 0.25 MG/5ML
0.2500 mg | Freq: Once | INTRAVENOUS | Status: AC
  Administered 2024-04-06: 0.25 mg via INTRAVENOUS
  Filled 2024-04-06: qty 5

## 2024-04-06 MED ORDER — SODIUM CHLORIDE 0.9 % IV SOLN: INTRAVENOUS | Status: DC

## 2024-04-06 MED ORDER — SODIUM CHLORIDE 0.9 % IV SOLN
150.0000 mg | Freq: Once | INTRAVENOUS | Status: AC
  Administered 2024-04-06: 150 mg via INTRAVENOUS
  Filled 2024-04-06: qty 150

## 2024-04-06 MED ORDER — SODIUM CHLORIDE 0.9 % IV SOLN
15.0000 mg/kg | Freq: Once | INTRAVENOUS | Status: AC
  Administered 2024-04-06: 1200 mg via INTRAVENOUS
  Filled 2024-04-06: qty 48

## 2024-04-06 MED ORDER — SODIUM CHLORIDE 0.9 % IV SOLN
400.0000 mg/m2 | Freq: Once | INTRAVENOUS | Status: AC
  Administered 2024-04-06: 800 mg via INTRAVENOUS
  Filled 2024-04-06: qty 20

## 2024-04-06 NOTE — Progress Notes (Signed)
 "     [facility] [phone]   [phone]  OFFICE PROGRESS NOTE  Wendy Garnette LABOR, MD [address] [place] [address]  DIAGNOSIS:   1) Metastatic non-small cell lung cancer, adenocarcinoma initially diagnosed as stage IIIa (T1c, N2, M0) non-small cell lung cancer, adenocarcinoma presented with right lower lobe lung nodule in addition to right paratracheal lymphadenopathy diagnosed in February 2025.  The patient has evidence for disease progression with bilateral pulmonary nodules in addition to bone metastasis in September 2025.  Molecular studies by [email or web address] showed no actionable mutations and negative PD-L1 expression.   2) history of rheumatoid arthritis and currently on treatment with Plaquenil .  PRIOR THERAPY:  1) A course of concurrent chemoradiation with weekly carboplatin  for AUC of 2 and paclitaxel  45 Mg/M2.  First dose May 04, 2023.  Status post 7 cycles.  Last dose was given on 06/15/2023. 2) Consolidation treatment with immunotherapy with Imfinzi  1500 Mg IV every 4 weeks status post 5 cycles.  Last dose was given 11/10/2023 discontinued secondary to disease progression.  CURRENT THERAPY:  1) Systemic chemotherapy with carboplatin  for AUC of 5, Alimta  500 mg/M2 and Avastin  15 MGs/KG every 3 weeks.  First dose 12/03/2023.  Status post 5 cycle.  Starting cycle #5 third dose of carboplatin  was reduced to AUC of 4 and Alimta  400 mg/M2 secondary to pancytopenia. 2) Xgeva  120 mg subcutaneous every 3 months start January 27, 2024.  INTERVAL HISTORY: Wendy [name] 79 y.o. female returns to the clinic today for follow-up visit accompanied by her granddaughter. Discussed the use of AI scribe software for clinical note transcription with the patient, who gave verbal consent to proceed.  History of Present Illness Wendy [name] is a 79 year old female with metastatic non-small cell lung cancer who presents for evaluation prior to cycle  six of systemic chemotherapy.  She was diagnosed with stage IIIA non-small cell lung cancer (adenocarcinoma) in February 2025, with progression to metastatic disease involving bilateral pulmonary nodules and T8 vertebral bone metastasis in September 2025. She has been receiving systemic chemotherapy with carboplatin , Alimta , and Avastin  every three weeks, and has completed five cycles to date.  She reports feeling well overall without significant chemotherapy-related adverse effects. She denies nausea, vomiting, diarrhea, rash, bleeding, fever, chills, or sweats. She has not experienced chest pain, shortness of breath, palpitations, or edema. No abnormal bleeding, bruising, gastrointestinal, or genitourinary complaints. She notes a recent weight gain of approximately two pounds since her last visit, with current weight at 190 pounds.     MEDICAL HISTORY: Past Medical History:  Diagnosis Date   Allergy    year around   Anemia    long time ago    Anxiety    Arthritis    RA    Cataract    removed  both dr effie-    Complication of anesthesia    DEPENDENT EDEMA 07/21/2007   DIABETES MELLITUS, TYPE II 11/05/2006   FOOT PAIN 05/23/2009   GERD 11/20/2006   Hepatitis    HEPATITIS C 02/08/2007   sees GI @ [facility]   History of radiation therapy    Thoracic Spine-12/14/23-12/25/23- Dr. 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 Jon  Hawkes     ALLERGIES:  is allergic to aspirin and tramadol.  MEDICATIONS:  Current Outpatient Medications  Medication Sig Dispense Refill   Accu-Chek Softclix Lancets lancets Test once per day and diagnosis code is E 11.9 and dispense for (ACCU-CHEK Soft Touch) 100 each 12   ALPRAZolam  (XANAX ) 1 MG tablet  TAKE 1 TABLET (1 MG TOTAL) BY MOUTH AT BEDTIME AS NEEDED FOR ANXIETY OR SLEEP. 30 tablet 5   amLODipine  (NORVASC ) 10 MG tablet Take 1 tablet (10 mg total) by mouth daily. 90 tablet 3   atorvastatin  (LIPITOR) 10 MG tablet Take 1 tablet (10 mg total) by mouth daily. 90 tablet 3   Blood Glucose Monitoring Suppl (ACCU-CHEK GUIDE) w/Device KIT Use to test sugars daily. 1 kit 0   Blood Glucose Monitoring Suppl DEVI 1 each by Does not apply route in the morning, at noon, and at bedtime. May substitute to any manufacturer covered by patient's insurance. 1 each 0   Blood Pressure Monitor KIT Use to check blood pressure. 1 kit 0   Cholecalciferol (VITAMIN D3) 20 MCG (800 UNIT) TABS Take 800 Units by mouth daily.     dexamethasone  (DECADRON ) 4 MG tablet Take 1 tab by mouth 2 times daily starting day before pemetrexed . Then take 2 tabs daily x 3 days starting day after chemo. Take with food. 30 tablet 1   DULoxetine  (CYMBALTA ) 30 MG capsule TAKE 1 CAPSULE BY MOUTH EVERY DAY 90 capsule 3   fluticasone  (FLONASE ) 50 MCG/ACT nasal spray SPRAY 2 SPRAYS INTO EACH NOSTRIL EVERY DAY 48 mL 3   folic acid  (FOLVITE ) 1 MG tablet Take 1 tablet (1 mg total) by mouth daily. Start 7 days before pemetrexed  chemotherapy. Continue until 21 days after pemetrexed  completed. 100 tablet 3   furosemide  (LASIX ) 20 MG tablet Take 1 tablet (20 mg total) by mouth daily as needed (fluid retention). 90 tablet 3   Ginkgo Biloba (GNP GINGKO BILOBA EXTRACT PO) Take by mouth.     glipiZIDE  (GLUCOTROL ) 5 MG tablet Take 1 tablet (5 mg total) by mouth 2 (two) times daily before a meal. 180 tablet 3   glucose blood (ACCU-CHEK GUIDE TEST) test strip Use as instructed 100 strip 1   hydroxychloroquine  (PLAQUENIL ) 200 MG tablet Take 1 tablet (200 mg total) by mouth daily. 90 tablet 1   insulin  aspart (NOVOLOG  FLEXPEN) 100 UNIT/ML FlexPen Inject 5 Units into the skin 3 (three) times daily with meals. Plus sliding scale , maximum 20 units/day. 15 mL 4    insulin  degludec (TRESIBA  FLEXTOUCH) 100 UNIT/ML FlexTouch Pen Inject 20 Units into the skin daily. 15 mL 4   Insulin  Pen Needle 32G X 4 MM MISC Use 4x a day 300 each 3   Lancets (ACCU-CHEK SOFT TOUCH) lancets Test once per day and diagnosis code is E 11.9 and dispense for Aviva plus 100 each 3   LANTUS  SOLOSTAR 100 UNIT/ML Solostar Pen Inject 25 Units into the skin at bedtime.     levocetirizine (XYZAL ) 5 MG tablet TAKE 1 TABLET BY MOUTH EVERY DAY IN THE EVENING 90 tablet 3   levocetirizine (XYZAL ) 5 MG tablet Take 1 tablet (5 mg total) by mouth every evening. 90 tablet 3   lidocaine -prilocaine  (EMLA ) cream Apply to affected area once 30 g 3   metoCLOPramide  (REGLAN ) 10 MG tablet Take 1 tablet (10 mg total) by mouth 3 (three) times daily with meals for 5 days. 15 tablet 0   mupirocin  ointment (BACTROBAN ) 2 % Apply  1 Application topically 2 (two) times daily. 22 g 5   omeprazole  (PRILOSEC) 40 MG capsule TAKE 1 CAPSULE BY MOUTH EVERY DAY 90 capsule 3   ondansetron  (ZOFRAN ) 4 MG tablet Take 1 tablet (4 mg total) by mouth every 8 (eight) hours as needed for nausea or vomiting. 90 tablet 3   ondansetron  (ZOFRAN ) 8 MG tablet Take 1 tablet (8 mg total) by mouth every 8 (eight) hours as needed for nausea or vomiting. Start on the third day after chemotherapy. 30 tablet 1   oxyCODONE -acetaminophen  (PERCOCET) 10-325 MG tablet Take 1 tablet by mouth every 6 (six) hours as needed for pain. 120 tablet 0   oxyCODONE -acetaminophen  (PERCOCET) 10-325 MG tablet Take 1 tablet by mouth every 6 (six) hours as needed for pain. 120 tablet 0   oxyCODONE -acetaminophen  (PERCOCET) 10-325 MG tablet Take 1 tablet by mouth every 6 (six) hours as needed for pain. 120 tablet 0   polyethylene glycol (MIRALAX  / GLYCOLAX ) 17 g packet Take 17 g by mouth daily as needed for mild constipation.     potassium chloride  (KLOR-CON ) 10 MEQ tablet TAKE 1 TABLET BY MOUTH EVERY DAY 90 tablet 3   pregabalin  (LYRICA ) 100 MG capsule TAKE 1 CAPSULE  BY MOUTH AT BEDTIME. 90 capsule 1   prochlorperazine  (COMPAZINE ) 10 MG tablet Take 1 tablet (10 mg total) by mouth every 6 (six) hours as needed for nausea or vomiting. 30 tablet 1   RESTASIS  0.05 % ophthalmic emulsion Place 1 drop into both eyes 2 (two) times daily.     sitaGLIPtin  (JANUVIA ) 100 MG tablet Take 1 tablet (100 mg total) by mouth at bedtime. 90 tablet 3   sucralfate  (CARAFATE ) 1 g tablet Take 1 tablet (1 g total) by mouth 4 (four) times daily. Dissolve each tablet in 15 cc water before use. 120 tablet 2   No current facility-administered medications for this visit.    SURGICAL HISTORY:  Past Surgical History:  Procedure Laterality Date   BRONCHIAL BRUSHINGS  04/07/2023   Procedure: BRONCHIAL BRUSHINGS;  Surgeon: Shelah Lamar RAMAN, MD;  Location: [facility] ENDOSCOPY;  Service: Pulmonary;;   BRONCHIAL NEEDLE ASPIRATION BIOPSY  04/07/2023   Procedure: BRONCHIAL NEEDLE ASPIRATION BIOPSIES;  Surgeon: Shelah Lamar RAMAN, MD;  [facility] END[facility]COPY;  Service: Pulmonary;;   CATARACT EXTRACTION     COLONOSCOPY  11/23/2019   per Dr. Teressa, adenmatous polyp, repeat in 7 yrs   ESOPHAGOGASTRODUODENOSCOPY  06/20/2008   at [facility], clear    FINE NEEDLE ASPIRATION  04/07/2023   Procedure: FINE NEEDLE ASPIRATION (FNA) LINEAR;  Surgeon: Shelah Lamar RAMAN, MD;  Location[facility] ENDOSCOPY;  Service: Pulmonary;;   IR IMAGING GUIDED PORT INSERTION  04/30/2023   LUMBAR LAMINECTOMY     1995   ROTATOR CUFF REPAIR Right    SPINAL FUSION N/A 07/29/2022   Procedure: T10-PELVIS INSTRUMENTATION/ T10-L4 POSTERIOR SPINAL FUSION;  Surgeon: Georgina Ozell LABOR, MD;  Location: [facility] OR;  Service: Orthopedics;  Laterality: N/A;   UPPER GASTROINTESTINAL ENDOSCOPY  2010   [facility]    VIDEO BRONCHOSCOPY WITH ENDOBRONCHIAL ULTRASOUND N/A 04/07/2023   Procedure: VIDEO BRONCHOSCOPY WITH ENDOBRONCHIAL ULTRASOUND;  Surgeon: Shelah Lamar RAMAN, MD;  Location: [facility] ENDOSCOPY;  Service: Pulmonary;  Laterality: N/A;    REVIEW OF SYSTEMS:  A  comprehensive review of systems was negative.   PHYSICAL EXAMINATION: General appearance: alert, cooperative, and no distress Head: Normocephalic, without obvious abnormality, atraumatic Neck: no adenopathy, no JVD, supple, symmetrical, trachea midline, and thyroid  not enlarged, symmetric, no tenderness/mass/nodules  Lymph nodes: Cervical, supraclavicular, and axillary nodes normal. Resp: clear to auscultation bilaterally Back: symmetric, no curvature. ROM normal. No CVA tenderness. Cardio: regular rate and rhythm, S1, S2 normal, no murmur, click, rub or gallop GI: soft, non-tender; bowel sounds normal; no masses,  no organomegaly Extremities: extremities normal, atraumatic, no cyanosis or edema  ECOG PERFORMANCE STATUS: 1 - Symptomatic but completely ambulatory  Blood pressure (!) 153/90, pulse 97, temperature 97.9 F (36.6 C), temperature source Temporal, resp. rate 17, height 5' 3 (1.6 m), weight 188 lb 11.2 oz (85.6 kg), SpO2 100%.  LABORATORY DATA: Lab Results  Component Value Date   WBC 2.9 (L) 04/06/2024   HGB 11.3 (L) 04/06/2024   HCT 35.9 (L) 04/06/2024   MCV 85.1 04/06/2024   PLT 105 (L) 04/06/2024      Chemistry      Component Value Date/Time   NA 138 03/30/2024 1503   K 4.8 03/30/2024 1503   CL 102 03/30/2024 1503   CO2 26 03/30/2024 1503   BUN 18 03/30/2024 1503   CREATININE 1.00 03/30/2024 1503   CREATININE 1.07 (H) 09/16/2019 1511      Component Value Date/Time   CALCIUM  9.1 03/30/2024 1503   ALKPHOS 79 03/30/2024 1503   AST 24 03/30/2024 1503   ALT 17 03/30/2024 1503   BILITOT 0.4 03/30/2024 1503       RADIOGRAPHIC STUDIES: No results found.     ASSESSMENT AND PLAN: This is a very pleasant 79 years old African-American female with Stage IIIa (T1c, N2, M0) non-small cell lung cancer, adenocarcinoma presented with right lower lobe lung nodule in addition to right paratracheal lymphadenopathy diagnosed in February 2025. Molecular studies by  [email or web address] showed no actionable mutations and negative PD-L1 expression. She also has a history of rheumatoid arthritis and currently on treatment with Plaquenil . She underwent a course of concurrent chemoradiation with weekly carboplatin  for AUC of 2 and paclitaxel  45 Mg/M2.  First dose May 04, 2023.  States post 7 cycles.  Last cycle was given on 06/15/2023. She is currently undergoing consolidation treatment with immunotherapy with Imfinzi  1500 Mg IV every 4 weeks status post 3 cycles. She had repeat CT scan of the chest performed recently. Her scan showed improvement in the right lower lobe lung mass but there was a lytic lesion at the T8 vertebrae.  She had a PET scan performed recently.  I personally and independently reviewed the PET scan images and discussed the result and showed the images to the patient today.  Her PET scan showed the right lower lobe malignancy with the expected postradiation changes and mild FDG uptake but there was new nodular FDG avid lesions in bilateral lower lobes consistent with metastasis as well as interval increase in pleural effusion.  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 She is currently undergoing systemic chemotherapy with carboplatin  for AUC of 5, Alimta  500 mg/M2 and Avastin  15 MGs/KG every 3 weeks status post 5 cycle. She tolerated the first cycle of her treatment fairly well except for pancytopenia and starting from cycle #6 her dose of carboplatin  will be reduced to AUC of 4 and Alimta  400 mg/M2. She will also start treatment with Xgeva  120 mg subcutaneously every 3 months starting January 27, 2024 for the bone disease. Assessment and Plan Assessment & Plan Metastatic non-small cell lung cancer, adenocarcinoma Metastatic disease managed with palliative systemic chemotherapy. She has completed five cycles and received cycle six today with dose reductions of carboplatin   and pemetrexed  due to mild leukopenia.  She is tolerating therapy well without significant adverse effects. Weight is stable with recent gain. Blood pressure is mildly elevated. Prognosis remains poor; focus remains on symptom management and quality of life. - Administered cycle 6 of systemic chemotherapy with carboplatin , pemetrexed , and bevacizumab , with reduced doses of carboplatin  and pemetrexed  due to leukopenia. - Instructed her to monitor for fever, chills, bleeding, gastrointestinal symptoms, and rash, and to report these symptoms promptly. - Advised home blood pressure monitoring. - Obtained urine sample for bevacizumab  monitoring; continue urine monitoring for proteinuria. - Ordered imaging in two weeks prior to next visit to assess disease status. - Scheduled follow-up in three weeks. The patient was advised to call immediately if he has any other concerning symptoms in the interval. The patient voices understanding of current disease status and treatment options and is in agreement with the current care plan.  All questions were answered. The patient knows to call the clinic with any problems, questions or concerns. We can certainly see the patient much sooner if necessary.  The total time spent in the appointment was 30 minutes.  Disclaimer: This note was dictated with voice recognition software. Similar sounding words can inadvertently be transcribed and may not be corrected upon review.        "

## 2024-04-06 NOTE — Patient Instructions (Signed)
 [facility] - A DEPT OF [facility]  Discharge Instructions: Thank you for choosing [facility] to provide your oncology and hematology care.   If you have a lab appointment with the Cancer Center, please go directly to the Cancer Center and check in at the registration area.   Wear comfortable clothing and clothing appropriate for easy access to any Portacath or PICC line.   We strive to give you quality time with your provider. You may need to reschedule your appointment if you arrive late (15 or more minutes).  Arriving late affects you and other patients whose appointments are after yours.  Also, if you miss three or more appointments without notifying the office, you may be dismissed from the clinic at the providers discretion.      For prescription refill requests, have your pharmacy contact our office and allow 72 hours for refills to be completed.    Today you received the following chemotherapy and/or immunotherapy agents: Bevacizumab  (Mvasi ), Pemetrexed  (Alimta ) and Carboplatin .     To help prevent nausea and vomiting after your treatment, we encourage you to take your nausea medication as directed.  BELOW ARE SYMPTOMS THAT SHOULD BE REPORTED IMMEDIATELY: *FEVER GREATER THAN 100.4 F (38 C) OR HIGHER *CHILLS OR SWEATING *NAUSEA AND VOMITING THAT IS NOT CONTROLLED WITH YOUR NAUSEA MEDICATION *UNUSUAL SHORTNESS OF BREATH *UNUSUAL BRUISING OR BLEEDING *URINARY PROBLEMS (pain or burning when urinating, or frequent urination) *BOWEL PROBLEMS (unusual diarrhea, constipation, pain near the anus) TENDERNESS IN MOUTH AND THROAT WITH OR WITHOUT PRESENCE OF ULCERS (sore throat, sores in mouth, or a toothache) UNUSUAL RASH, SWELLING OR PAIN  UNUSUAL VAGINAL DISCHARGE OR ITCHING   Items with * indicate a potential emergency and should be followed up as soon as possible or go to the Emergency Department if any problems should occur.  Please  show the CHEMOTHERAPY ALERT CARD or IMMUNOTHERAPY ALERT CARD at check-in to the Emergency Department and triage nurse.  Should you have questions after your visit or need to cancel or reschedule your appointment, please contact [facility] - A DEPT OF [facility]  Dept: [phone]  and follow the prompts.  Office hours are 8:00 a.m. to 4:30 p.m. Monday - Friday. Please note that voicemails left after 4:00 p.m. may not be returned until the following business day.  We are closed weekends and major holidays. You have access to a nurse at all times for urgent questions. Please call the main number to the clinic Dept: [phone] and follow the prompts.   For any non-urgent questions, you may also contact your provider using MyChart. We now offer e-Visits for anyone 71 and older to request care online for non-urgent symptoms. For details visit [email or web address].   Also download the MyChart app! Go to the app store, search MyChart, open the app, select [facility], and log in with your MyChart username and password.

## 2024-04-13 ENCOUNTER — Inpatient Hospital Stay

## 2024-04-20 ENCOUNTER — Inpatient Hospital Stay

## 2024-04-20 ENCOUNTER — Ambulatory Visit (HOSPITAL_COMMUNITY)

## 2024-04-26 ENCOUNTER — Inpatient Hospital Stay

## 2024-04-26 ENCOUNTER — Inpatient Hospital Stay: Admitting: Physician Assistant

## 2024-05-18 ENCOUNTER — Inpatient Hospital Stay: Admitting: Internal Medicine

## 2024-05-18 ENCOUNTER — Inpatient Hospital Stay

## 2024-05-18 ENCOUNTER — Inpatient Hospital Stay: Attending: Internal Medicine

## 2024-06-08 ENCOUNTER — Inpatient Hospital Stay: Admitting: Internal Medicine

## 2024-06-08 ENCOUNTER — Inpatient Hospital Stay

## 2024-06-08 ENCOUNTER — Inpatient Hospital Stay: Attending: Internal Medicine

## 2024-06-13 ENCOUNTER — Ambulatory Visit: Admitting: Endocrinology
